# Patient Record
Sex: Male | Born: 1970
Health system: Southern US, Community
[De-identification: ages and names within clinical notes are randomized; demographics above are authoritative.]

## PROBLEM LIST (undated history)

## (undated) DIAGNOSIS — E119 Type 2 diabetes mellitus without complications: Secondary | ICD-10-CM

## (undated) DIAGNOSIS — F32A Depression, unspecified: Secondary | ICD-10-CM

## (undated) DIAGNOSIS — I639 Cerebral infarction, unspecified: Secondary | ICD-10-CM

## (undated) DIAGNOSIS — I10 Essential (primary) hypertension: Secondary | ICD-10-CM

## (undated) DIAGNOSIS — G919 Hydrocephalus, unspecified: Secondary | ICD-10-CM

## (undated) DIAGNOSIS — F329 Major depressive disorder, single episode, unspecified: Secondary | ICD-10-CM

## (undated) DIAGNOSIS — I5189 Other ill-defined heart diseases: Secondary | ICD-10-CM

## (undated) DIAGNOSIS — E785 Hyperlipidemia, unspecified: Secondary | ICD-10-CM

## (undated) DIAGNOSIS — R569 Unspecified convulsions: Secondary | ICD-10-CM

## (undated) HISTORY — DX: Major depressive disorder, single episode, unspecified: F32.9

## (undated) HISTORY — DX: Type 2 diabetes mellitus without complications: E11.9

## (undated) HISTORY — DX: Essential (primary) hypertension: I10

## (undated) HISTORY — DX: Hyperlipidemia, unspecified: E78.5

## (undated) HISTORY — DX: Cerebral infarction, unspecified: I63.9

## (undated) HISTORY — DX: Depression, unspecified: F32.A

## (undated) HISTORY — DX: Other ill-defined heart diseases: I51.89

## (undated) HISTORY — DX: Unspecified convulsions: R56.9

## (undated) HISTORY — DX: Hydrocephalus, unspecified: G91.9

---

## 2004-06-08 ENCOUNTER — Emergency Department: Payer: Self-pay | Admitting: Emergency Medicine

## 2007-02-12 ENCOUNTER — Emergency Department: Payer: Self-pay | Admitting: Unknown Physician Specialty

## 2007-04-05 ENCOUNTER — Other Ambulatory Visit: Payer: Self-pay

## 2007-04-05 ENCOUNTER — Emergency Department: Payer: Self-pay | Admitting: Emergency Medicine

## 2009-04-17 ENCOUNTER — Emergency Department: Payer: Self-pay | Admitting: Emergency Medicine

## 2012-08-29 ENCOUNTER — Emergency Department: Payer: Self-pay | Admitting: Internal Medicine

## 2012-08-29 LAB — CBC
HCT: 44.8 % (ref 40.0–52.0)
MCH: 28.8 pg (ref 26.0–34.0)
MCHC: 32.2 g/dL (ref 32.0–36.0)
Platelet: 219 10*3/uL (ref 150–440)
RBC: 5.01 10*6/uL (ref 4.40–5.90)
RDW: 13.3 % (ref 11.5–14.5)

## 2012-08-29 LAB — URINALYSIS, COMPLETE
Bacteria: NONE SEEN
Bilirubin,UR: NEGATIVE
Glucose,UR: NEGATIVE mg/dL (ref 0–75)
Ketone: NEGATIVE
Leukocyte Esterase: NEGATIVE
Ph: 5 (ref 4.5–8.0)
Protein: 30
RBC,UR: <1 /HPF (ref 0–5)
Specific Gravity: 1.006 (ref 1.003–1.030)
Squamous Epithelial: NONE SEEN

## 2012-08-29 LAB — COMPREHENSIVE METABOLIC PANEL
Alkaline Phosphatase: 82 U/L (ref 50–136)
Bilirubin,Total: 0.4 mg/dL (ref 0.2–1.0)
Calcium, Total: 8.4 mg/dL — ABNORMAL LOW (ref 8.5–10.1)
EGFR (African American): >60
Glucose: 171 mg/dL — ABNORMAL HIGH (ref 65–99)
Osmolality: 278 (ref 275–301)
Potassium: 4.3 mmol/L (ref 3.5–5.1)
SGPT (ALT): 21 U/L (ref 12–78)
Sodium: 137 mmol/L (ref 136–145)
Total Protein: 7.9 g/dL (ref 6.4–8.2)

## 2012-08-29 LAB — DRUG SCREEN, URINE
Amphetamines, Ur Screen: NEGATIVE (ref ?–1000)
Benzodiazepine, Ur Scrn: NEGATIVE (ref ?–200)
Cannabinoid 50 Ng, Ur ~~LOC~~: POSITIVE (ref ?–50)
Cocaine Metabolite,Ur ~~LOC~~: NEGATIVE (ref ?–300)
MDMA (Ecstasy)Ur Screen: NEGATIVE (ref ?–500)
Methadone, Ur Screen: NEGATIVE (ref ?–300)

## 2012-08-29 LAB — LIPASE, BLOOD: Lipase: 662 U/L — ABNORMAL HIGH (ref 73–393)

## 2012-08-29 LAB — ETHANOL: Ethanol %: 0.18 % — ABNORMAL HIGH (ref 0.000–0.080)

## 2013-12-14 ENCOUNTER — Inpatient Hospital Stay: Payer: Self-pay | Admitting: Internal Medicine

## 2013-12-14 LAB — COMPREHENSIVE METABOLIC PANEL
ALBUMIN: 3.9 g/dL (ref 3.4–5.0)
ALT: 18 U/L (ref 12–78)
Alkaline Phosphatase: 73 U/L
Anion Gap: 3 — ABNORMAL LOW (ref 7–16)
BUN: 18 mg/dL (ref 7–18)
Bilirubin,Total: 0.7 mg/dL (ref 0.2–1.0)
CREATININE: 1.42 mg/dL — AB (ref 0.60–1.30)
Calcium, Total: 9 mg/dL (ref 8.5–10.1)
Chloride: 108 mmol/L — ABNORMAL HIGH (ref 98–107)
Co2: 29 mmol/L (ref 21–32)
EGFR (Non-African Amer.): >60
GLUCOSE: 140 mg/dL — AB (ref 65–99)
Osmolality: 284 (ref 275–301)
Potassium: 3.8 mmol/L (ref 3.5–5.1)
SGOT(AST): 22 U/L (ref 15–37)
SODIUM: 140 mmol/L (ref 136–145)
Total Protein: 7.7 g/dL (ref 6.4–8.2)

## 2013-12-14 LAB — CBC
HCT: 45.2 % (ref 40.0–52.0)
HGB: 14.5 g/dL (ref 13.0–18.0)
MCH: 29 pg (ref 26.0–34.0)
MCHC: 32 g/dL (ref 32.0–36.0)
MCV: 91 fL (ref 80–100)
PLATELETS: 199 10*3/uL (ref 150–440)
RBC: 4.98 10*6/uL (ref 4.40–5.90)
RDW: 14.4 % (ref 11.5–14.5)
WBC: 4.2 10*3/uL (ref 3.8–10.6)

## 2013-12-14 LAB — URINALYSIS, COMPLETE
BACTERIA: NONE SEEN
BILIRUBIN, UR: NEGATIVE
GLUCOSE, UR: NEGATIVE mg/dL (ref 0–75)
KETONE: NEGATIVE
LEUKOCYTE ESTERASE: NEGATIVE
Nitrite: NEGATIVE
PH: 7 (ref 4.5–8.0)
PROTEIN: NEGATIVE
RBC,UR: 3 /HPF (ref 0–5)
SPECIFIC GRAVITY: 1.01 (ref 1.003–1.030)
Squamous Epithelial: NONE SEEN
WBC UR: <1 /HPF (ref 0–5)

## 2013-12-14 LAB — PROTIME-INR
INR: 1
PROTHROMBIN TIME: 13.4 s (ref 11.5–14.7)

## 2013-12-14 LAB — APTT: Activated PTT: 26.6 secs (ref 23.6–35.9)

## 2013-12-14 LAB — TROPONIN I: Troponin-I: <0.02 ng/mL

## 2013-12-15 LAB — LIPID PANEL
CHOLESTEROL: 205 mg/dL — AB (ref 0–200)
HDL Cholesterol: 49 mg/dL (ref 40–60)
Ldl Cholesterol, Calc: 125 mg/dL — ABNORMAL HIGH (ref 0–100)
Triglycerides: 154 mg/dL (ref 0–200)
VLDL CHOLESTEROL, CALC: 31 mg/dL (ref 5–40)

## 2013-12-15 LAB — CBC WITH DIFFERENTIAL/PLATELET
Basophil #: 0 10*3/uL (ref 0.0–0.1)
Basophil %: 0.6 %
EOS ABS: 0.3 10*3/uL (ref 0.0–0.7)
EOS PCT: 5.7 %
HCT: 42.1 % (ref 40.0–52.0)
HGB: 13.4 g/dL (ref 13.0–18.0)
LYMPHS PCT: 39.4 %
Lymphocyte #: 2 10*3/uL (ref 1.0–3.6)
MCH: 28.9 pg (ref 26.0–34.0)
MCHC: 31.7 g/dL — AB (ref 32.0–36.0)
MCV: 91 fL (ref 80–100)
MONO ABS: 0.5 x10 3/mm (ref 0.2–1.0)
Monocyte %: 10.2 %
Neutrophil #: 2.3 10*3/uL (ref 1.4–6.5)
Neutrophil %: 44.1 %
Platelet: 188 10*3/uL (ref 150–440)
RBC: 4.62 10*6/uL (ref 4.40–5.90)
RDW: 14.4 % (ref 11.5–14.5)
WBC: 5.1 10*3/uL (ref 3.8–10.6)

## 2013-12-15 LAB — BASIC METABOLIC PANEL
ANION GAP: 7 (ref 7–16)
BUN: 14 mg/dL (ref 7–18)
Calcium, Total: 9.1 mg/dL (ref 8.5–10.1)
Chloride: 104 mmol/L (ref 98–107)
Co2: 28 mmol/L (ref 21–32)
Creatinine: 1.07 mg/dL (ref 0.60–1.30)
EGFR (African American): >60
EGFR (Non-African Amer.): >60
Glucose: 99 mg/dL (ref 65–99)
Osmolality: 278 (ref 275–301)
POTASSIUM: 3.4 mmol/L — AB (ref 3.5–5.1)
Sodium: 139 mmol/L (ref 136–145)

## 2013-12-15 LAB — SEDIMENTATION RATE: Erythrocyte Sed Rate: 6 mm/hr (ref 0–15)

## 2013-12-16 ENCOUNTER — Encounter: Payer: Self-pay | Admitting: *Deleted
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
## 2013-12-17 ENCOUNTER — Other Ambulatory Visit: Payer: Self-pay | Admitting: Physical Medicine and Rehabilitation

## 2013-12-17 ENCOUNTER — Encounter: Payer: Self-pay | Admitting: Physical Medicine and Rehabilitation

## 2013-12-17 ENCOUNTER — Encounter (HOSPITAL_COMMUNITY): Payer: Self-pay | Admitting: Physical Medicine and Rehabilitation

## 2013-12-17 ENCOUNTER — Inpatient Hospital Stay (HOSPITAL_COMMUNITY)
Admission: RE | Admit: 2013-12-17 | Discharge: 2014-01-07 | DRG: 945 | Disposition: A | Discharge status: Home Health | Payer: Medicare Other | Source: Intra-hospital | Attending: Physical Medicine & Rehabilitation | Admitting: Physical Medicine & Rehabilitation

## 2013-12-17 DIAGNOSIS — R1310 Dysphagia, unspecified: Secondary | ICD-10-CM | POA: Diagnosis present

## 2013-12-17 DIAGNOSIS — I6340 Cerebral infarction due to embolism of unspecified cerebral artery: Secondary | ICD-10-CM | POA: Diagnosis present

## 2013-12-17 DIAGNOSIS — E1142 Type 2 diabetes mellitus with diabetic polyneuropathy: Secondary | ICD-10-CM | POA: Diagnosis present

## 2013-12-17 DIAGNOSIS — Z7982 Long term (current) use of aspirin: Secondary | ICD-10-CM | POA: Diagnosis not present

## 2013-12-17 DIAGNOSIS — IMO0001 Reserved for inherently not codable concepts without codable children: Secondary | ICD-10-CM | POA: Diagnosis not present

## 2013-12-17 DIAGNOSIS — I152 Hypertension secondary to endocrine disorders: Secondary | ICD-10-CM | POA: Diagnosis present

## 2013-12-17 DIAGNOSIS — R4701 Aphasia: Secondary | ICD-10-CM | POA: Diagnosis present

## 2013-12-17 DIAGNOSIS — G8190 Hemiplegia, unspecified affecting unspecified side: Secondary | ICD-10-CM | POA: Diagnosis present

## 2013-12-17 DIAGNOSIS — I6330 Cerebral infarction due to thrombosis of unspecified cerebral artery: Secondary | ICD-10-CM

## 2013-12-17 DIAGNOSIS — I429 Cardiomyopathy, unspecified: Secondary | ICD-10-CM

## 2013-12-17 DIAGNOSIS — Z5189 Encounter for other specified aftercare: Principal | ICD-10-CM

## 2013-12-17 DIAGNOSIS — R4789 Other speech disturbances: Secondary | ICD-10-CM | POA: Diagnosis present

## 2013-12-17 DIAGNOSIS — E1149 Type 2 diabetes mellitus with other diabetic neurological complication: Secondary | ICD-10-CM

## 2013-12-17 DIAGNOSIS — G40909 Epilepsy, unspecified, not intractable, without status epilepticus: Secondary | ICD-10-CM | POA: Diagnosis present

## 2013-12-17 DIAGNOSIS — G8110 Spastic hemiplegia affecting unspecified side: Secondary | ICD-10-CM

## 2013-12-17 DIAGNOSIS — E0959 Drug or chemical induced diabetes mellitus with other circulatory complications: Secondary | ICD-10-CM

## 2013-12-17 DIAGNOSIS — I10 Essential (primary) hypertension: Secondary | ICD-10-CM | POA: Diagnosis present

## 2013-12-17 DIAGNOSIS — I428 Other cardiomyopathies: Secondary | ICD-10-CM | POA: Diagnosis present

## 2013-12-17 DIAGNOSIS — E1159 Type 2 diabetes mellitus with other circulatory complications: Secondary | ICD-10-CM | POA: Diagnosis present

## 2013-12-17 DIAGNOSIS — I6350 Cerebral infarction due to unspecified occlusion or stenosis of unspecified cerebral artery: Secondary | ICD-10-CM

## 2013-12-17 DIAGNOSIS — N289 Disorder of kidney and ureter, unspecified: Secondary | ICD-10-CM | POA: Diagnosis not present

## 2013-12-17 DIAGNOSIS — Z8673 Personal history of transient ischemic attack (TIA), and cerebral infarction without residual deficits: Secondary | ICD-10-CM | POA: Diagnosis present

## 2013-12-17 DIAGNOSIS — Z7902 Long term (current) use of antithrombotics/antiplatelets: Secondary | ICD-10-CM | POA: Diagnosis not present

## 2013-12-17 DIAGNOSIS — I639 Cerebral infarction, unspecified: Secondary | ICD-10-CM

## 2013-12-17 DIAGNOSIS — E1165 Type 2 diabetes mellitus with hyperglycemia: Secondary | ICD-10-CM

## 2013-12-17 DIAGNOSIS — IMO0002 Reserved for concepts with insufficient information to code with codable children: Secondary | ICD-10-CM | POA: Diagnosis present

## 2013-12-17 MED ORDER — DIPHENHYDRAMINE HCL 12.5 MG/5ML PO ELIX: 12.5000 mg | ORAL_SOLUTION | Freq: Four times a day (QID) | ORAL | Status: DC | PRN

## 2013-12-17 MED ORDER — HYDROCHLOROTHIAZIDE 25 MG PO TABS
25.0000 mg | ORAL_TABLET | Freq: Every day | ORAL | Status: DC
  Administered 2013-12-18 – 2013-12-21 (×4): 25 mg via ORAL
  Filled 2013-12-17 (×6): qty 1

## 2013-12-17 MED ORDER — CLOPIDOGREL BISULFATE 75 MG PO TABS
75.0000 mg | ORAL_TABLET | Freq: Every day | ORAL | Status: DC
  Administered 2013-12-18 – 2014-01-07 (×21): 75 mg via ORAL
  Filled 2013-12-17 (×23): qty 1

## 2013-12-17 MED ORDER — ASPIRIN 81 MG PO CHEW
81.0000 mg | CHEWABLE_TABLET | Freq: Every day | ORAL | Status: DC
  Administered 2013-12-18 – 2014-01-07 (×21): 81 mg via ORAL
  Filled 2013-12-17 (×23): qty 1

## 2013-12-17 MED ORDER — LISINOPRIL 20 MG PO TABS
20.0000 mg | ORAL_TABLET | Freq: Every day | ORAL | Status: DC
  Administered 2013-12-18 – 2013-12-21 (×4): 20 mg via ORAL
  Filled 2013-12-17 (×6): qty 1

## 2013-12-17 MED ORDER — CARVEDILOL 6.25 MG PO TABS
6.2500 mg | ORAL_TABLET | Freq: Two times a day (BID) | ORAL | Status: DC
  Administered 2013-12-17 – 2014-01-07 (×43): 6.25 mg via ORAL
  Filled 2013-12-17 (×46): qty 1

## 2013-12-17 MED ORDER — ALUM & MAG HYDROXIDE-SIMETH 200-200-20 MG/5ML PO SUSP: 30.0000 mL | ORAL | Status: DC | PRN

## 2013-12-17 MED ORDER — BISACODYL 10 MG RE SUPP
10.0000 mg | Freq: Every day | RECTAL | Status: DC | PRN
  Administered 2013-12-21: 10 mg via RECTAL
  Filled 2013-12-17: qty 1

## 2013-12-17 MED ORDER — PROCHLORPERAZINE MALEATE 5 MG PO TABS
5.0000 mg | ORAL_TABLET | Freq: Four times a day (QID) | ORAL | Status: DC | PRN
  Filled 2013-12-17: qty 2

## 2013-12-17 MED ORDER — ATORVASTATIN CALCIUM 80 MG PO TABS
80.0000 mg | ORAL_TABLET | Freq: Every day | ORAL | Status: DC
  Administered 2013-12-17 – 2014-01-07 (×22): 80 mg via ORAL
  Filled 2013-12-17 (×22): qty 1

## 2013-12-17 MED ORDER — TRAZODONE HCL 50 MG PO TABS: 25.0000 mg | ORAL_TABLET | Freq: Every evening | ORAL | Status: DC | PRN

## 2013-12-17 MED ORDER — ENOXAPARIN SODIUM 40 MG/0.4ML ~~LOC~~ SOLN
40.0000 mg | Freq: Every day | SUBCUTANEOUS | Status: DC
  Administered 2013-12-18 – 2014-01-07 (×21): 40 mg via SUBCUTANEOUS
  Filled 2013-12-17 (×23): qty 0.4

## 2013-12-17 MED ORDER — PROCHLORPERAZINE 25 MG RE SUPP
12.5000 mg | Freq: Four times a day (QID) | RECTAL | Status: DC | PRN
  Filled 2013-12-17: qty 1

## 2013-12-17 MED ORDER — GABAPENTIN 300 MG PO CAPS
300.0000 mg | ORAL_CAPSULE | Freq: Three times a day (TID) | ORAL | Status: DC
  Administered 2013-12-17 – 2014-01-07 (×64): 300 mg via ORAL
  Filled 2013-12-17 (×67): qty 1

## 2013-12-17 MED ORDER — POLYETHYLENE GLYCOL 3350 17 G PO PACK
17.0000 g | PACK | Freq: Every day | ORAL | Status: DC
  Administered 2013-12-18 – 2013-12-20 (×3): 17 g via ORAL
  Filled 2013-12-17 (×4): qty 1

## 2013-12-17 MED ORDER — GUAIFENESIN-DM 100-10 MG/5ML PO SYRP: 5.0000 mL | ORAL_SOLUTION | Freq: Four times a day (QID) | ORAL | Status: DC | PRN

## 2013-12-17 MED ORDER — POLYETHYLENE GLYCOL 3350 17 G PO PACK
17.0000 g | PACK | Freq: Once | ORAL | Status: AC
  Administered 2013-12-17: 17 g via ORAL
  Filled 2013-12-17: qty 1

## 2013-12-17 MED ORDER — FLEET ENEMA 7-19 GM/118ML RE ENEM: 1.0000 | ENEMA | Freq: Every day | RECTAL | Status: DC | PRN

## 2013-12-17 MED ORDER — ACETAMINOPHEN 325 MG PO TABS: 325.0000 mg | ORAL_TABLET | ORAL | Status: DC | PRN

## 2013-12-17 MED ORDER — AMLODIPINE BESYLATE 10 MG PO TABS
10.0000 mg | ORAL_TABLET | Freq: Every day | ORAL | Status: DC
  Administered 2013-12-18 – 2014-01-07 (×21): 10 mg via ORAL
  Filled 2013-12-17 (×23): qty 1

## 2013-12-17 MED ORDER — PROCHLORPERAZINE EDISYLATE 5 MG/ML IJ SOLN
5.0000 mg | Freq: Four times a day (QID) | INTRAMUSCULAR | Status: DC | PRN
  Filled 2013-12-17: qty 2

## 2013-12-17 NOTE — H&P (Signed)
Physical Medicine and Rehabilitation Admission H&P  CC: Right sided weakness and difficulty talking  HPI: [name] is a 43 year old male with history of DM type 2, seizure disorder, HTN, prior CVA who was admitted to [facility] on 12/14/13 with weakness X 2 days and difficulty talking. Patient had been out of BP medications X 2 1/2 months. CT head with stable hydrocephalus. 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 Carotid dopplers with minimal plaque and no significant stenosis. 2D echo with mildly decreased LVF with moderately increased LV posterior wall and EF 40-45%. 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 He was placed on ASA and Plavix per neurology recommendations and TEE ordered to rule out SOE. TEE done today revealing normal LVF, no thrombi in left atrium, left ventricle or apex but smoke present, negative bubble study. Cardiology (Dr. Adrian Blackwater) recommended anticoagulation for secondary stroke prevention.  Swallow evaluation done by ST due to oromotor weakness and DIII, thin liquids recommended due to pocketing. Patient with resultant right sided weakness, mild right inattention, slurred speech and dysphagia. Blood pressures are improving with resumption of home meds. Therapies ongoing and he has had some improvement in right sided weakness. CIR recommended by rehab team and MD. Patient admitted for comprehensive inpatient rehab.   Pt c/o wetting on himself, per RN it appears he spilled his urinal  ROS :  Cannot obtain secondary to aphasia   Past Medical History    Diagnosis  Date    .  Hypertension     .  Diabetes mellitus without complication     .  Seizures     .  Stroke      No past surgical history on file.  No family history on file.  Social History:  Disabled. Lives with brother ( who works part time as a Curator) reports that he has never smoked. He does not have any smokeless tobacco history on file. He drinks 3 vodkas once a week. His alcohol and drug histories are not on file.  Allergies: Allergies not on file   (Not in a hospital admission)  Home:  Lives in an apartment.  Functional History:    Independent PTA.  Functional Status:  Mobility:  CGA for bed mobility  Mn to mod assist for sit to stand transfer with improvement in RLE stability.  Has had improvement in weight shifting but with tendency for posterior lean.  Mod assist X 2 for ambulation with frequent LOB posterior and to the right.  ADL:  Working on ROM RUE  Mod assist for toileting.  Cognition:    Home Medications:  Glipizide XL 10 mg bid  Gabapentin 300 mg tid  Metformin 1000 mg bid  ASA 81 mg qd  Prinzide 20/25 qd  Norvasc 10 mg qd  Lipitor 80 mg q hs  Physical Exam:  There were no vitals taken for this visit.  Physical Exam   General: No acute distress Mood and affect are appropriate Heart: Regular rate and rhythm no rubs murmurs or extra sounds Lungs: Clear to auscultation, breathing unlabored, no rales or wheezes Abdomen: Positive bowel sounds, soft nontender to palpation, nondistended Extremities: No clubbing, cyanosis, or edema Skin: No evidence of breakdown except small area on L forearm, no evidence of rash Neurologic: Cranial nerves II through XII intact, motor strength is 5/5 in Left deltoid, bicep, tricep,  grip, hip flexor, knee extensors, ankle dorsiflexor and plantar flexor 3 minus/5 in the left biceps triceps and deltoid 4 minus at grip, 4 minus right hip flexor knee extensor ankle dorsiflexor plantar flexor Sensory exam normal sensation to light touch and proprioception in left upper and lower extremities Decreased sensation to the right hand light touch. Normal sensation right lower extremity Cerebellar exam normal finger to nose to  finger as well as heel to shin in left upper and lower extremities Unable to perform this on the right side secondary to weakness Musculoskeletal: Full range of motion in all 4 extremities. No joint swelling Labwork:  Na-139 K-3.4 CL-104 Co2-28 BUN-14 Cr-1.07 Gluc-88  Hgb-13.4 Hct-42.1 WBC-5.1 Plt-188  Chol-205 HDL-49 LDL-125 VLDL- 31 Trig-154  B-12, Lupus AB, ANA, CRP--pending.  Medical Problem List and Plan:  1. Functional deficits secondary to embolic infarcts left corona radiata, left lentiform nucleus, left temporal lobe with R Hemiparesis and aphasia 2. DVT Prophylaxis/Anticoagulation: Pharmaceutical: Lovenox  3. Pain Management: Tylenol 650 mg every 4 hours when necessary  4. Mood: Monitor for post stroke depression  5. Neuropsych: This patient is not capable of making decisions on his own behalf.  6. HTN: 7. DM type 2:  8. Seizure disorder:  9.  Post Admission Physician Evaluation:  1. Functional deficits secondary to left brain embolic infarcts. 2. Patient is admitted to receive collaborative, interdisciplinary care between the physiatrist, rehab nursing staff, and therapy team. 3. Patient's level of medical complexity and substantial therapy needs in context of that medical necessity cannot be provided at a lesser intensity of care such as a SNF. 4. Patient has experienced substantial functional loss from his/her baseline which was documented above under the "Functional History" and "Functional Status" headings. Judging by the patient's diagnosis, physical exam, and functional history, the patient has potential for functional progress which will result in measurable gains while on inpatient rehab. These gains will be of substantial and practical use upon discharge in facilitating mobility and self-care at the household level. 5. Physiatrist will provide 24 hour management of medical needs as well as oversight of the therapy plan/treatment and provide guidance as appropriate regarding  the interaction of the two. 6. 24 hour rehab nursing will assist with bladder management, bowel management, safety, skin/wound care, disease management, medication administration, pain management and patient education and help integrate therapy concepts, techniques,education, etc. 7. PT will assess and treat for/with: pre gait, gait training, endurance , safety, equipment, neuromuscular re education. Goals are: Sup. 8. OT will assess and treat for/with: ADLs, Cognitive perceptual skills, Neuromuscular re education, safety, endurance, equipment. Goals are: min/Sup. 9. SLP will assess and treat for/with: Language, cognition. Goals are: express basic needs. 10. Case Management and Social Worker will assess and treat for psychological issues and discharge planning. 11. Team conference will be held weekly to assess progress toward goals and to determine barriers to discharge. 12. Patient will receive at least 3 hours of therapy per day at least 5 days per week. 13. ELOS: 15-20 days  14. Prognosis: excellent  Erick ColaceAndrew E. Sequoia Mincey M.D. [facility] FAAPM&R (Sports Med, Neuromuscular Med) Diplomate Am Board of Electrodiagnostic Med   12/17/2013

## 2013-12-17 NOTE — Consult Note (Addendum)
CARDIOLOGY CONSULT NOTE   Patient ID: [name] MRN: [id number] DOB/AGE: 1970-07-27 43 y.o.  Admit date: 12/17/2013  Primary Physician   No PCP Per Patient Primary Cardiologist   New Reason for Consultation  Smoke on TEE- guidance for anticoagulation   HPI:  [name] is a 43 year old male with history of congenital hydrocephalous, mild MR, DM type 2, seizure disorder, HTN, prior CVA who was admitted to [facility] on 12/14/13 with an acute CVA. Patient with resultant right sided weakness, slurred speech and dysphagia so he was transferred to [facility] on 12/17/13 for comprehensive inpatient rehab. At [facility] regional CT head with stable hydrocephalus. 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 He was placed on ASA and Plavix per neurology recommendations and TEE ordered to rule out SOE. TEE done revealing normal LVF, no thrombi in left atrium, left ventricle or apex but smoke present, negative bubble study. Cardiology (Dr. Adrian Blackwater) who performed the TEE recommended anticoagulation for secondary stroke prevention. 2D echo with mildly decreased LVF with moderately increased LV posterior wall and EF 40-45%. Patient with resultant right sided weakness, mild right inattention, slurred speech and dysphagia. Therapies ongoing and he has had some improvement in right sided weakness. CIR recommended by rehab team and MD. Patient admitted for comprehensive inpatient rehab. No documentation of atrial fibrillation and his telemetry was unremarkable while inpatient at [facility].   Patient has mild MR either at baseline or due to acute stroke. He is unable to tell me if he ever gets chest pain or SOB. It sounds like he does get chest pain from  time to time.    Past Medical History  Diagnosis Date  . Hypertension   . Diabetes mellitus without complication   . Seizures   . Stroke      No past surgical history on file.  No Known Allergies  I have reviewed the patient's current medications . [START ON 12/18/2013] amLODipine  10 mg Oral Daily  . [START ON 12/18/2013] aspirin  81 mg Oral Daily  . atorvastatin  80 mg Oral q1800  . [START ON 12/18/2013] clopidogrel  75 mg Oral Q breakfast  . [START ON 12/18/2013] enoxaparin (LOVENOX) injection  40 mg Subcutaneous Daily  . gabapentin  300 mg Oral TID  . [START ON 12/18/2013] hydrochlorothiazide  25 mg Oral Daily  . [START ON 12/18/2013] lisinopril  20 mg Oral Daily  . [START ON 12/18/2013] polyethylene glycol  17 g Oral Daily  . polyethylene glycol  17 g Oral Once     acetaminophen, alum & mag hydroxide-simeth, bisacodyl, diphenhydrAMINE, guaiFENesin-dextromethorphan, prochlorperazine, prochlorperazine, prochlorperazine, sodium phosphate, traZODone  Prior to Admission medications   Medication Sig Start Date End Date Taking? Authorizing Provider  amLODipine (NORVASC) 10 MG tablet Take 10 mg by mouth daily.   Yes Historical Provider, MD  atorvastatin (LIPITOR) 80 MG tablet Take 80 mg by mouth daily.   Yes Historical Provider, MD  benazepril-hydrochlorthiazide (LOTENSIN HCT) 20-25 MG per tablet Take 1 tablet by mouth daily.   Yes Historical Provider, MD  gabapentin (NEURONTIN) 300 MG capsule Take 300 mg by mouth 3 (three) times daily.   Yes Historical Provider, MD  glipiZIDE (GLUCOTROL XL) 10 MG 24 hr tablet Take 10 mg by mouth 2 (two) times daily.   Yes  Historical Provider, MD  metFORMIN (GLUCOPHAGE) 500 MG tablet Take 1,000 mg by mouth 2 (two) times daily with a meal.   Yes Historical Provider, MD     History   Social History  . Marital Status: Single    Spouse Name: N/A    Number of Children: N/A  . Years of Education: N/A   Occupational History  . Not on file.   Social  History Main Topics  . Smoking status: Never Smoker   . Smokeless tobacco: Not on file  . Alcohol Use: Not on file  . Drug Use: Not on file  . Sexual Activity: Not on file   Other Topics Concern  . Not on file   Social History Narrative  . No narrative on file    No family status information on file.   Family History  Problem Relation Age of Onset  . Stroke Father   . Heart disease Father      ROS:  Full 14 point review of systems complete and found to be negative unless listed above.  Physical Exam: Blood pressure 172/106, pulse 88, temperature 98.3 F (36.8 C), temperature source Oral, resp. rate 18, SpO2 98.00%.  General: Well developed, well nourished, male in no acute distress. Right facial drooping. Mild MR. Unable to get a good history  Head: Eyes PERRLA, No xanthomas.   Normocephalic and atraumatic, oropharynx without edema or exudate. Dentition:  Lungs: CTAB Heart: HRRR S1 S2, no rub/gallop, Heart irregular rate and rhythm with S1, S2  murmur. pulses are 2+ extrem.   Neck: No carotid bruits. No lymphadenopathy. NO JVD. Abdomen: Bowel sounds present, abdomen soft and non-tender without masses or hernias noted. Msk:  No spine or cva tenderness. No weakness, no joint deformities or effusions. Extremities: No clubbing or cyanosis. no edema.  Neuro: Alert and oriented X 3. No focal deficits noted. Psych:  Good affect, responds appropriately Skin: No rashes or lesions noted.  Echo:    Radiology:  No results found.  ASSESSMENT AND PLAN:    Principal Problem:   CVA (cerebral infarction) Active Problems:   HTN (hypertension)   Diabetes    [name] is a 43 year old male with history of congenital hydrocephalous, mild MR, DM type 2, seizure disorder, HTN, prior CVA who was admitted to [facility] on 12/14/13 with an acute CVA. Patient with resultant right sided weakness, slurred speech and dysphagia so he was [facility] for comprehensive inpatient  rehab. Smoke seen on TEE and cardiology consulted for guidance on anticoagulation.   CVA- on ASA and plavix. No documented Afib, there was talk of putting him on a monitor but never happened from what I see in records. Consider a loop monitor.  -- TEE ordered to rule out SOE.This revealed normal LVF, no thrombi in left atrium, left ventricle or apex but smoke present, negative bubble study -- Cardiologist who performed TEE recommended anticoagulation. At this point, with no documented afib it is felt that smoke on TEE is not enough evidence to initiate anticoagulation. MD to see.   LV dysfunction- 2D echo on 12/14/13 with EF 40-45%, mildly dec global LV dysfunction. Impaired relaxation pattern of LV diastolic filling. Mod increased left ventricular posterior wall thickness.  -- Continue ACE. Consider adding a BB if he does have reduced LV function (BP remains high)  Hypertension - per notes from [facility]- difficult to control but improved with lisinopril, HCTZ and Norvasc. BP remains high at 172/106.  HLD- cont statin  SignedThereasa Parkin, PA-C 12/17/2013 4:21 PM  Pager [phone]  Co-Sign MD  Patient seen and examined with Deborha Payment, PA-C. We discussed all aspects of the encounter. I agree with the assessment and plan as stated above. Despite his CVA and smoke on TEE, in the absence of clearly documented AF, I would not recommend systemic anticoagulation. Would continue ASA and Plavix. Would add carvedilol to help with HTN and LV dysfunction. Suspect CVA and LV dysfunction may be hypertensive in nature. ECG ordered. I d/w Dr. Graciela Husbands who agrees. Will ask EP to see on Monday to consider ILR (loop recorder).  If tele available on 41M can consider placing him on tele.   Garo Heidelberg,MD 6:02 PM

## 2013-12-17 NOTE — Progress Notes (Signed)
Patient trans in from [facility] this afternoon by PTAR around 1415. On assessments, he is alert and oriented. Slightly facial droop and weakness to the R arm and leg but MAE, full sensation with expressive aphasia. Vitals checked and recorded. Call bell placed within reach and oriented to the room.

## 2013-12-17 NOTE — Progress Notes (Signed)
Discussed patient with neurology regarding TEE results and need for anticoagulation. They recommended getting cardiology input on this as waiting two weeks to start full anticoagulation. [place] cards consulted and will await input on appropriate therapy.
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
## 2013-12-18 ENCOUNTER — Inpatient Hospital Stay (HOSPITAL_COMMUNITY): Payer: Medicare Other | Admitting: Physical Therapy

## 2013-12-18 ENCOUNTER — Inpatient Hospital Stay (HOSPITAL_COMMUNITY): Payer: Medicare Other | Admitting: Speech Pathology

## 2013-12-18 ENCOUNTER — Inpatient Hospital Stay (HOSPITAL_COMMUNITY): Payer: Medicare Other

## 2013-12-18 NOTE — Progress Notes (Signed)
Occupational Therapy Assessment and Plan  Patient Details  Name: [name] MRN: [id number] Date of Birth: January 26, 1971  OT Diagnosis: abnormal posture, apraxia, ataxia, cognitive deficits, disturbance of vision, hemiplegia affecting dominant side and muscle weakness (generalized) Rehab Potential: Rehab Potential: Good ELOS: 17-21 days   Today's Date: 12/18/2013 Time: 1100-1200 Time Calculation (min): 60 min  Problem List:  Patient Active Problem List   Diagnosis Date Noted  . CVA (cerebral infarction) 12/17/2013  . HTN (hypertension) 12/17/2013  . Diabetes 12/17/2013  . Cardiomyopathy 12/17/2013    Past Medical History:  Past Medical History  Diagnosis Date  . Hypertension   . Diabetes mellitus without complication   . Seizures   . Stroke    Past Surgical History: No past surgical history on file.  Assessment & Plan Clinical Impression: Patient is a 43 year old male with history of DM type 2, seizure disorder, HTN, prior CVA who was admitted to [facility] on 12/14/13 with weakness X 2 days and difficulty talking. Patient had been out of BP medications X 2 1/2 months. CT head with stable hydrocephalus. 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 Carotid dopplers with minimal plaque and no significant stenosis. 2D echo with mildly decreased LVF with moderately increased LV posterior wall and EF 40-45%. 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 He was placed on ASA and Plavix per neurology recommendations and TEE ordered to rule out SOE. TEE done today revealing normal LVF, no thrombi in left atrium, left ventricle or apex but smoke s/o low flow state, negative bubble study. Cardiology (Dr. Neoma Laming) recommended anticoagulation for secondary stroke prevention. Swallow  evaluation done by ST due to oromotor weakness and DIII, thin liquids recommended due to pocketing. Patient with resultant right sided weakness, mild right inattention, slurred speech and dysphagia. Blood pressures are improving with resumption of home meds. Therapies ongoing and he has had some improvement in right sided weakness. CIR recommended by rehab team and MD. Patient admitted for comprehensive inpatient rehab.  Patient transferred to CIR on 12/17/2013 .    Patient currently requires max with basic self-care skills secondary to muscle weakness, decreased cardiorespiratoy endurance, impaired timing and sequencing, unbalanced muscle activation, motor apraxia, ataxia, decreased coordination and decreased motor planning, decreased visual motor skills, decreased attention to right and decreased motor planning, decreased initiation, decreased attention, decreased problem solving and decreased safety awareness and decreased standing balance, decreased postural control and decreased balance strategies.  Prior to hospitalization, patient could complete BADLs with modified independent . Patient is poor historian and difficult to truly determine PLOF.  Patient will benefit from skilled intervention to increase independence with basic self-care skills prior to discharge home with care partner.  Anticipate patient will require 24 hour supervision and follow up home health.      Skilled Therapeutic Intervention OT eval completed. Discussed role of OT, goals of therapy, fall risk, and safety plan. Pt seen for ADL retraining with focus on functional use of RUE, postural control in standing, and sit<>stand. Pt received sitting in w/c. Completed bathing at sink with therapist providing [place] assist to initiate functional use of RUE ~20% of time then pt able to assist more. Pt with noted apraxia in RUE. Completed sit<>stand with mod assist however required max assist for standing balance secondary to trunk ataxia. Pt  noted to have inattention to RUE, required  mod cues for postioning throughout session. Pt left sitting in w/c with QRB donned and all needs in reach.   OT Evaluation Precautions/Restrictions  Precautions Precautions: Fall Restrictions Weight Bearing Restrictions: No General   Vital Signs   Pain Pain Assessment Pain Assessment: No/denies pain Pain Score: 0-No pain Home Living/Prior Functioning Home Living Available Help at Discharge: Family Type of Home: Apartment Home Access: Stairs to enter [employer] of Steps: 1 step to front door porch, 4 steps to back door entrance Entrance Stairs-Rails: None Home Layout: One level  Lives With: Family (brother) Prior Function Level of Independence: Independent with basic ADLs;Independent with transfers;Needs assistance with homemaking  Able to Take Stairs?: Yes Driving: No Vocation: On disability Leisure: Hobbies-yes (Comment) Comments: moving grass, relaxing ADL   Vision/Perception  Vision- History Baseline Vision/History: No visual deficits Patient Visual Report: Blurring of vision Vision- Assessment Vision Assessment?: Yes Eye Alignment: Impaired (comment) Ocular Range of Motion: Impaired-to be further tested in functional context;Restricted on the right;Restricted on the left;Restricted looking up;Restricted looking down Tracking/Visual Pursuits: Other (comment) (decreased tracking in all quadrants) Saccades: Additional head turns occurred during testing Convergence: Impaired (comment)  Cognition Overall Cognitive Status: Impaired/Different from baseline Arousal/Alertness: Awake/alert Orientation Level: Oriented to person;Oriented to place;Oriented to time;Oriented to situation (required choose of 2 d/t expressive aphasia) Attention: Sustained Sustained Attention: Appears intact Memory: Impaired Memory Impairment: Other (comment) (difficult to determine d/t expressive aphasia) Awareness: Appears intact Problem  Solving: Impaired Problem Solving Impairment: Verbal basic;Functional basic Safety/Judgment: Impaired Sensation Sensation Light Touch: Impaired Detail Light Touch Impaired Details: Impaired RUE (mainly distally) Additional Comments: RUE diminished sensation and pt reports numbness, BLEs appear intact Coordination Gross Motor Movements are Fluid and Coordinated: No Fine Motor Movements are Fluid and Coordinated: No Finger Nose Finger Test: unable to complete wtih RUE d/t weakness; rigid movements with LUE Motor    Mobility     Trunk/Postural Assessment     Balance   Extremity/Trunk Assessment RUE Assessment RUE Assessment: Exceptions to [facility] RUE AROM (degrees) RUE Overall AROM Comments: Has full ROM at shulder and elbow however inconsistent d/t apraxia. Grossly 3+/5 strength shoulder flexion, elbow flexion/ext LUE Assessment LUE Assessment: Within Functional Limits  FIM:  FIM - Grooming Grooming Steps: Wash, rinse, dry face;Oral care, brush teeth, clean dentures Grooming: 3: Patient completes 2 of 4 or 3 of 5 steps FIM - Bathing Bathing Steps Patient Completed: Chest;Right Arm;Abdomen;Front perineal area;Buttocks;Right upper leg;Left upper leg;Right lower leg (including foot) Bathing: 4: Min-Patient completes 8-9 32f10 parts or 75+ percent (max assist for standing balance) FIM - Upper Body Dressing/Undressing Upper body dressing/undressing steps patient completed: Thread/unthread left sleeve of pullover shirt/dress Upper body dressing/undressing: 2: Max-Patient completed 25-49% of tasks FIM - Lower Body Dressing/Undressing Lower body dressing/undressing: 1: Total-Patient completed less than 25% of tasks   Refer to Care Plan for Long Term Goals  Recommendations for other services: None  Discharge Criteria: Patient will be discharged from OT if patient refuses treatment 3 consecutive times without medical reason, if treatment goals not met, if there is a change in medical  status, if patient makes no progress towards goals or if patient is discharged from hospital.  The above assessment, treatment plan, treatment alternatives and goals were discussed and mutually agreed upon: by patient  PDuayne Cal6/13/2015, 11:27 AM

## 2013-12-18 NOTE — Progress Notes (Signed)
  Subjective/Complaints: 43 year old male with history of DM type 2, seizure disorder, HTN, prior CVA who was admitted to [facility] on 12/14/13 with weakness X 2 days and difficulty talking. Patient had been out of BP medications X 2 1/2 months. CT head with stable hydrocephalus. MRI brain done revealing acute infarct left corona radiata, lentiform nuclei, left posterior temporal lobe and chronic severe ventriculomegaly/hydrocephalus  Aphasic Review of Systems - unable to obtain secondary to aphasia  Objective: Vital Signs: Blood pressure 110/72, pulse 65, temperature 98 F (36.7 C), temperature source Oral, resp. rate 20, SpO2 97.00%. No results found. No results found for this or any previous visit (from the past 72 hour(s)).    Mood and affect are appropriate  Heart: Regular rate and rhythm no rubs murmurs or extra sounds  Lungs: Clear to auscultation, breathing unlabored, no rales or wheezes  Abdomen: Positive bowel sounds, soft nontender to palpation, nondistended  Extremities: No clubbing, cyanosis, or edema  Skin: No evidence of breakdown except small area on L forearm, no evidence of rash  Neurologic: Cranial nerves II through XII intact, motor strength is 5/5 in Left deltoid, bicep, tricep, grip, hip flexor, knee extensors, ankle dorsiflexor and plantar flexor  3 minus/5 in the left biceps triceps and deltoid 4 minus at grip, 4 minus right hip flexor knee extensor ankle dorsiflexor plantar flexor  Sensory exam normal sensation to light touch and proprioception in left upper and lower extremities  Decreased sensation to the right hand light touch. Normal sensation right lower extremity  Cerebellar exam normal finger to nose to finger as well as heel to shin in left upper and lower extremities  Unable to perform this on the right side secondary to weakness   Assessment/Plan: 1. Functional deficits secondary to Left CR, Lentiform nucleus, temporal infarct which require 3+ hours per day  of interdisciplinary therapy in a comprehensive inpatient rehab setting. Physiatrist is providing close team supervision and 24 hour management of active medical problems listed below. Physiatrist and rehab team continue to assess barriers to discharge/monitor patient progress toward functional and medical goals. FIM:                   Comprehension Comprehension Mode: Auditory Comprehension: 5-Understands complex 90% of the time/Cues < 10% of the time  Expression Expression Mode: Verbal Expression: 6-Expresses complex ideas: With extra time/assistive device  Social Interaction Social Interaction: 6-Interacts appropriately with others with medication or extra time (anti-anxiety, antidepressant).         Medical Problem List and Plan:  1. Functional deficits secondary to embolic infarcts left corona radiata, left lentiform nucleus, left temporal lobe with R Hemiparesis and aphasia  2. DVT Prophylaxis/Anticoagulation: Pharmaceutical: Lovenox  3. Pain Management: Tylenol 650 mg every 4 hours when necessary  4. Mood: Monitor for post stroke depression  5. Neuropsych: This patient is not capable of making decisions on his own behalf.  6. HTN: 7. DM type 2:  8. Seizure disorder:   LOS (Days) 1 A FACE TO FACE EVALUATION WAS PERFORMED  Demeco Ducksworth E 12/18/2013, 10:04 AM

## 2013-12-18 NOTE — Plan of Care (Signed)
Problem: RH BOWEL ELIMINATION Goal: RH STG MANAGE BOWEL WITH ASSISTANCE STG Manage Bowel with Assistance.Modified independent  Outcome: Not Progressing LBM 12-14-13 Goal: RH STG MANAGE BOWEL W/MEDICATION W/ASSISTANCE STG Manage Bowel with Medication with Assistance. Min assist  Outcome: Not Progressing LBM 12-14-13

## 2013-12-18 NOTE — Evaluation (Signed)
Speech Language Pathology Assessment and Plan  Patient Details  Name: [name] MRN: [id number] Date of Birth: 1970/08/10  SLP Diagnosis: Aphasia;Dysphagia;Cognitive Impairments  Rehab Potential: Excellent ELOS: 17-21 days   Today's Date: 12/18/2013 Time: 1430-1530 Time Calculation (min): 60 min  Problem List:  Patient Active Problem List   Diagnosis Date Noted  . CVA (cerebral infarction) 12/17/2013  . HTN (hypertension) 12/17/2013  . Diabetes 12/17/2013  . Cardiomyopathy 12/17/2013   Past Medical History:  Past Medical History  Diagnosis Date  . Hypertension   . Diabetes mellitus without complication   . Seizures   . Stroke    Past Surgical History: No past surgical history on file.  Assessment / Plan / Recommendation Clinical Impression Patient is a 43 year old male with history of DM type 2, seizure disorder, HTN, prior CVA who was admitted to [facility] on 12/14/13 with weakness X 2 days and difficulty talking. Patient had been out of BP medications X 2 1/2 months. CT head with stable hydrocephalus. 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 Carotid dopplers with minimal plaque and no significant stenosis. 2D echo with mildly decreased LVF with moderately increased LV posterior wall and EF 40-45%. 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 He was placed on ASA and Plavix per neurology recommendations and TEE ordered to rule out SOE. TEE done revealing normal LVF, no thrombi in left atrium, left ventricle or apex but smoke s/o low flow state, negative bubble study. Cardiology (Dr. Neoma Laming) recommended anticoagulation for secondary stroke prevention. Swallow evaluation done by ST due to oral-motor weakness and Dys. 3 textures with thin liquids recommended due  to pocketing. Blood pressures are improving with resumption of home meds. Therapies ongoing and he has had some improvement in right sided weakness. CIR recommended by rehab team and MD. Patient admitted for comprehensive inpatient rehab on 12/17/13 and demonstrates a moderate aphasia impacting all four modalities of language, however, his receptive abilities are greater than his expressive abilities. Pt answered all basic yes/no questions accurately and demonstrated ability to follow 3 step commands. His expressive abilities are characterized by semantic and phonemic paraphasias with an occasional neologism with dysfluencies, however, pt is aware of his errors and attempts to self-correct. The pt can also make his needs known with extra time. Pt also demonstrates mild cognitive impairments characterized by a right inattention, decreased problem solving and safety awareness impacting his ability to perform functional tasks safely. Pt was also administered a BSE. Pt consumed thin liquids via cup and straw without overt s/s of aspiration with mild anterior spillage that pt could self-monitor and correct with verbal cues. The pt also consumed solid textures with decreased bolus manipulation and cohesion leading to mild right sulci pocketing that cleared with liquid washes and a lingual sweep. Recommend pt continue current diet of Dys. 3 textures with thin liquids via cup and full supervision. Pt will benefit from skilled SLP intervention to maximize cognitive-linguistic and swallowing function and overall functional independence prior to discharge. r  Skilled Therapeutic Interventions          Pt administered a cognitive-linguistic evaluation and BSE. Please see above for details.   SLP Assessment  Patient will need skilled Speech Lanaguage Pathology Services during CIR admission    Recommendations  Diet Recommendations: Dysphagia 3 (Mechanical Soft);Thin liquid Liquid Administration via: Cup;No straw Medication  Administration: Whole meds with puree Supervision: Patient able to self feed;Full supervision/cueing for compensatory strategies Compensations: Slow rate;Small sips/bites;Check for pocketing Postural Changes and/or Swallow Maneuvers: Out of bed for meals;Upright 30-60 min after meal Oral Care Recommendations: Oral care BID Patient destination: Home Follow up Recommendations: Outpatient SLP;24 hour supervision/assistance Equipment Recommended: None recommended by SLP    SLP Frequency 5 out of 7 days   SLP Treatment/Interventions Cueing hierarchy;Cognitive remediation/compensation;Internal/external aids;Environmental controls;Speech/Language facilitation;Therapeutic Activities;Patient/family education;Functional tasks;Dysphagia/aspiration precaution training;Therapeutic Exercise    Pain No/Denies Pain  Short Term Goals: Week 1: SLP Short Term Goal 1 (Week 1): Pt will utilize swallowing compensatory strategies with Mod I to minimize overt s/s of aspiration.  SLP Short Term Goal 2 (Week 1): Pt will demonstrate effective mastication of regular textures with Mod I without overt s/s of aspiration.  SLP Short Term Goal 3 (Week 1): Pt will attend to the right field of enviornment during functional tasks with Min supervision cues.  SLP Short Term Goal 4 (Week 1): Pt will demonstrate functional problem solving for basic and familiar tasks with supervision mulimodal cues.  SLP Short Term Goal 5 (Week 1): Pt will self-monitor and correct verbal errors with Mod  A multimodal cues.   See FIM for current functional status Refer to Care Plan for Long Term Goals  Recommendations for other services: None  Discharge Criteria: Patient will be discharged from SLP if patient refuses treatment 3 consecutive times without medical reason, if treatment goals not met, if there is a change in medical status, if patient makes no progress towards goals or if patient is discharged from hospital.  The above assessment,  treatment plan, treatment alternatives and goals were discussed and mutually agreed upon: by patient  [name] 12/18/2013, 4:18 PM

## 2013-12-18 NOTE — Evaluation (Signed)
Physical Therapy Assessment and Plan  Patient Details  Name: [name] MRN: [id number] Date of Birth: December 30, 1970  PT Diagnosis: Abnormal posture, Abnormality of gait, Ataxia, Cognitive deficits, Hemiplegia dominant, Impaired cognition, Impaired sensation and Muscle weakness Rehab Potential: Good ELOS: 17-21 days   Today's Date: 12/18/2013 Time: 1100-1200 and 1400-1430 Time Calculation (min): 60 min and 30 min  Problem List:  Patient Active Problem List   Diagnosis Date Noted  . CVA (cerebral infarction) 12/17/2013  . HTN (hypertension) 12/17/2013  . Diabetes 12/17/2013  . Cardiomyopathy 12/17/2013    Past Medical History:  Past Medical History  Diagnosis Date  . Hypertension   . Diabetes mellitus without complication   . Seizures   . Stroke    Past Surgical History: No past surgical history on file.  Assessment & Plan Clinical Impression: Patient is a 43 year old male with history of DM type 2, seizure disorder, HTN, prior CVA who was admitted to [place] on 12/14/13 with weakness X 2 days and difficulty talking. Patient had been out of BP medications X 2 1/2 months. CT head with stable hydrocephalus. 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 Carotid dopplers with minimal plaque and no significant stenosis. 2D echo with mildly decreased LVF with moderately increased LV posterior wall and EF 40-45%. 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 He was placed on ASA and Plavix per neurology recommendations and TEE ordered to rule out SOE. TEE done today revealing normal LVF, no thrombi in left atrium, left ventricle or apex but smoke s/o low flow state, negative bubble study. Cardiology (Dr. Neoma Laming) recommended anticoagulation for secondary stroke prevention.  Swallow evaluation done by ST due to oromotor weakness and DIII, thin liquids recommended due to pocketing. Patient with resultant right sided weakness, mild right inattention, slurred speech and dysphagia. Blood pressures are improving with resumption of home meds. Therapies ongoing and he has had some improvement in right sided weakness. Patient transferred to CIR on 12/17/2013 .   Patient currently requires max to total A x 2 with mobility secondary to muscle weakness, impaired timing and sequencing, abnormal tone, unbalanced muscle activation, motor apraxia, ataxia, decreased coordination and decreased motor planning, decreased attention to right, decreased motor planning and ideational apraxia and decreased attention, decreased awareness, decreased problem solving, decreased safety awareness, decreased memory, delayed processing and expressive aphasia.  Prior to hospitalization, patient was independent  with mobility and lived with Family (brother) in a [place] home.  Home access is 1 step to front door porch, 4 steps to back door entranceStairs to enter. Patient is poor historian and difficulty to determine PLOF and amount of assistance available at discharge.   Patient will benefit from skilled PT intervention to maximize safe functional mobility, minimize fall risk and decrease caregiver burden for planned discharge home with 24 hour supervision.  Anticipate patient will benefit from follow up [place] at discharge.  PT - End of Session Activity Tolerance: Decreased this session;Tolerates 30+ min activity with multiple rests Endurance Deficit: Yes Endurance Deficit Description: requires rest breaks PT Assessment Rehab Potential: Good Barriers to Discharge: Decreased caregiver support (no family present, unsure of amount of assistance available at d/c) PT Patient demonstrates impairments in the following area(s): Balance;Behavior;Endurance;Motor;Perception;Safety;Sensory PT Transfers Functional  Problem(s): Bed Mobility;Bed to Chair;Furniture;Car;Floor PT Locomotion Functional Problem(s): Ambulation;Wheelchair Mobility;Stairs PT Plan PT Intensity: Minimum of 1-2 x/day ,  45 to 90 minutes PT Frequency: 5 out of 7 days PT Duration Estimated Length of Stay: 17-21 days PT Treatment/Interventions: Ambulation/gait training;Cognitive remediation/compensation;Balance/vestibular training;DME/adaptive equipment instruction;Functional electrical stimulation;Functional mobility training;Neuromuscular re-education;Patient/family education;Stair training;Therapeutic Activities;Therapeutic Exercise;UE/LE Strength taining/ROM;UE/LE Coordination activities;Wheelchair propulsion/positioning;Visual/perceptual remediation/compensation PT Transfers Anticipated Outcome(s): supervision PT Locomotion Anticipated Outcome(s): supervision-min A PT Recommendation Follow Up Recommendations: Home health PT Patient destination: Home Equipment Recommended: To be determined  Skilled Therapeutic Intervention Tx 1:Skilled therapeutic intervention initiated after completion of evaluation. Discussed falls risk, safety within room, and focus of therapy during stay. Session focused on functional transfers, w/c mobility, and gait. See details below. Patient with expressive aphasia resulting in patient being poor historian for PLOF/assist available at discharge and no family member present. Patient noted to have hemiplegia RUE > LUE, RUE inattention, apraxia, truncal ataxia in standing. Pt left sitting in w/c with quick release belt on awaiting OT evaluation.   Tx 2: Focus on functional transfers, sitting/standing balance, and stair negotiation. Pt received sitting in w/c, agreeable to therapy. Stand pivot transfer w/c > mat table with max A, pt with posterior lean. Sitting EOM, NMR for sit <> stand x 5 with emphasis on anterior weightshift, standing balance, and controlled descent with decreased R lean. Pt performed sitting and  standing static > dynamic balance reaching outside BOS to targets with mirror for visual feedback and stairs positioned anteriorly for UE support as needed. Pt requires close S to mod A for standing balance, decreased ataxia noted compared to earlier session. Stair negotiation up/down 3 stairs using 2 rails with +2 assist for safety, manual facilitation of weightshift and tactile/verbal cues for sequencing. Stand pivot transfer mat > w/c with improved anterior weightshift, max A. Pt returned to room and left sitting in w/c for SLP evaluation.   PT Evaluation Precautions/Restrictions Precautions Precautions: Fall Restrictions Weight Bearing Restrictions: No General Chart Reviewed: Yes Family/Caregiver Present: No Vital SignsTherapy Vitals BP: 107/69 mmHg Patient Position (if appropriate): Sitting (after ambulation) Pain Pain Assessment Pain Assessment: No/denies pain Pain Score: 0-No pain Home Living/Prior Functioning Home Living Available Help at Discharge: Family Type of Home: Apartment Home Access: Stairs to enter [employer] of Steps: 1 step to front door porch, 4 steps to back door entrance Entrance Stairs-Rails: None Home Layout: One level  Lives With: Family (brother) Prior Function Level of Independence: Independent with basic ADLs;Independent with transfers;Needs assistance with homemaking  Able to Take Stairs?: Yes Driving: No Vocation: On disability Leisure: Hobbies-yes (Comment) Comments: moving grass, relaxing Vision/Perception  Vision - Assessment Eye Alignment: Impaired (comment) Ocular Range of Motion: Impaired-to be further tested in functional context;Restricted on the right;Restricted on the left;Restricted looking up;Restricted looking down Tracking/Visual Pursuits: Other (comment) (decreased tracking in all quadrants) Saccades: Additional head turns occurred during testing Convergence: Impaired (comment)  Cognition Overall Cognitive Status:  Impaired/Different from baseline Arousal/Alertness: Awake/alert Orientation Level: Oriented to person;Oriented to place;Oriented to time;Oriented to situation (required choices and extra time due to aphasia) Attention: Sustained Sustained Attention: Appears intact Awareness: Appears intact Problem Solving: Impaired Problem Solving Impairment: Functional basic Executive Function: Self Monitoring;Self Correcting Self Monitoring: Impaired Self Monitoring Impairment: Verbal basic;Functional basic Self Correcting: Impaired Self Correcting Impairment: Verbal basic;Functional basic Safety/Judgment: Impaired Sensation Sensation Light Touch: Impaired by gross assessment Additional Comments: RUE diminished sensation (distally), BLEs appear intact Coordination Gross Motor Movements are Fluid and Coordinated: No Fine Motor Movements are Fluid and Coordinated: No Finger Nose Finger Test: unable to complete wtih RUE d/t weakness; rigid movements with LUE Heel Shin Test:  posterior LOB with testing BLEs sitting edge of mat, decreased coordination RLE Motor  Motor Motor: Ataxia;Motor apraxia;Hemiplegia;Abnormal postural alignment and control Motor - Skilled Clinical Observations: Hemiplegia RUE > RLE, truncal ataxia noted in standing   Mobility Bed Mobility Bed Mobility: Supine to Sit Supine to Sit: 4: Min guard;HOB flat Transfers Transfers: Yes Stand Pivot Transfers: 2: Max assist Stand Pivot Transfer Details: Manual facilitation for weight shifting;Tactile cues for placement;Verbal cues for technique;Verbal cues for sequencing Locomotion  Ambulation Ambulation: Yes Ambulation/Gait Assistance: 1: +2 Total assist Ambulation Distance (Feet): 20 Feet Assistive device: Other (Comment) (2 person "Three Musketeers" style) Ambulation/Gait Assistance Details: Verbal cues for gait pattern;Manual facilitation for weight shifting;Verbal cues for sequencing Ambulation/Gait Assistance Details: 3  muketeers style, therapist faciliating weightshift for adequate foot clearance and verbal/tactile cues for RLE knee ext in stance phase Gait Gait: Yes Gait Pattern: Impaired Gait Pattern: Step-to pattern;Decreased step length - left;Decreased stance time - right;Decreased dorsiflexion - right;Right flexed knee in stance;Ataxic;Decreased weight shift to right;Step-through pattern (truncal ataxia noted in standing) Gait velocity: decreased High Level Ambulation High Level Ambulation: Side stepping;Backwards walking Side Stepping: max A x 2 Backwards Walking: max A x 2 Stairs / Additional Locomotion Stairs: Yes Stairs Assistance: 1: +2 Total assist Stairs Assistance Details: Tactile cues for sequencing;Verbal cues for sequencing;Verbal cues for technique;Verbal cues for precautions/safety;Manual facilitation for weight shifting Stair Management Technique: Two rails Number of Stairs: 3 Height of Stairs: 6 Wheelchair Mobility Wheelchair Mobility: Yes Wheelchair Assistance: 4: Min assist;5: Investment banker, operational Details: Verbal cues for sequencing;Verbal cues for precautions/safety (R inattention) Wheelchair Propulsion: Left upper extremity;Both lower extermities Wheelchair Parts Management: Needs assistance Distance: 150 ft  Trunk/Postural Assessment  Cervical Assessment Cervical Assessment: Exceptions to [facility] (forward head) Thoracic Assessment Thoracic Assessment: Exceptions to [facility] (kyphotic) Lumbar Assessment Lumbar Assessment: Exceptions to [facility] (truncal ataxia noted in standing) Postural Control Postural Control: Deficits on evaluation Righting Reactions: Impaired in standing, decreased balance strategy  Balance Balance Balance Assessed: Yes Static Sitting Balance Static Sitting - Balance Support: Feet supported Static Sitting - Level of Assistance: 5: Stand by assistance Dynamic Sitting Balance Dynamic Sitting - Balance Support: Feet supported Dynamic Sitting -  Level of Assistance: 5: Stand by assistance Dynamic Sitting - Balance Activities: Lateral lean/weight shifting;Forward lean/weight shifting;Reaching across midline;Reaching for objects Static Standing Balance Static Standing - Balance Support: No upper extremity supported;Left upper extremity supported Static Standing - Level of Assistance: 4: Min assist;3: Mod assist Dynamic Standing Balance Dynamic Standing - Balance Support: No upper extremity supported;Left upper extremity supported Dynamic Standing - Level of Assistance: 3: Mod assist;2: Max assist Dynamic Standing - Balance Activities: Lateral lean/weight shifting;Forward lean/weight shifting;Reaching for objects;Reaching across midline Extremity Assessment  RUE Assessment RUE Assessment: Exceptions to [facility] RUE AROM (degrees) RUE Overall AROM Comments: Has full ROM at shulder and elbow however inconsistent d/t apraxia. Grossly 3+/5 strength shoulder flexion, elbow flexion/ext LUE Assessment LUE Assessment: Within Functional Limits RLE Assessment RLE Assessment: Exceptions to [facility] RLE Strength RLE Overall Strength: Deficits RLE Overall Strength Comments: overall 4/5 except ankle DF/PF 3-/5; difficult to assess d/t apraxia LLE Assessment LLE Assessment: Within Functional Limits  FIM:  FIM - Control and instrumentation engineer Devices: Arm rests Bed/Chair Transfer: 4: Supine > Sit: Min A (steadying Pt. > 75%/lift 1 leg);2: Bed > Chair or W/C: Max A (lift and lower assist);2: Chair or W/C > Bed: Max A (lift and lower assist) FIM - Locomotion: Wheelchair Distance: 150 ft Locomotion: Wheelchair: 5: Travels 150 ft  or more: maneuvers on rugs and over door sills with supervision, cueing or coaxing FIM - Locomotion: Ambulation Locomotion: Ambulation Assistive Devices: Other (comment) (3 musketeers style) Ambulation/Gait Assistance: 1: +2 Total assist Locomotion: Ambulation: 1: Two helpers (20 ft) FIM - Locomotion:  Stairs Locomotion: Scientist, physiological: Insurance account manager - 2 Locomotion: Stairs: 1: Two helpers (3 steps)   Refer to Care Plan for Long Term Goals  Recommendations for other services: None  Discharge Criteria: Patient will be discharged from PT if patient refuses treatment 3 consecutive times without medical reason, if treatment goals not met, if there is a change in medical status, if patient makes no progress towards goals or if patient is discharged from hospital.  The above assessment, treatment plan, treatment alternatives and goals were discussed and mutually agreed upon: by patient  Laretta Alstrom 12/18/2013, 12:40 PM

## 2013-12-19 ENCOUNTER — Inpatient Hospital Stay (HOSPITAL_COMMUNITY): Payer: Medicare Other | Admitting: Physical Therapy

## 2013-12-19 NOTE — Plan of Care (Signed)
Problem: RH BLADDER ELIMINATION Goal: RH STG MANAGE BLADDER WITH ASSISTANCE STG Manage Bladder With Min. Assistance  Outcome: Not Progressing Pt incont at times, spills urinal at times. Currently using condom cath at [facility] due to freq soiling of bed/clothing

## 2013-12-19 NOTE — IPOC Note (Addendum)
Overall Plan of Care [facility]) Patient Details Name: DUVALL GLISAN MRN: [id number] DOB: 05-22-1971  Admitting Diagnosis: L  MCA AND BG CVA  Hospital Problems: Principal Problem:   CVA (cerebral infarction) Active Problems:   HTN (hypertension)   Diabetes   Cardiomyopathy     Functional Problem List: Nursing Bladder;Bowel;Medication Management;Nutrition;Pain;Safety;Skin Integrity  PT [employer];Endurance;Motor;Perception;Safety;Sensory  OT Balance;Cognition;Endurance;Motor;Pain;Perception;Safety;Vision;Sensory  SLP Cognition;Linguistic  TR  activity tolerance, balance, safety, vision, cognition       Basic ADL's: OT Grooming;Bathing;Dressing;Toileting     Advanced  ADL's: OT       Transfers: PT Bed Mobility;Bed to Chair;Furniture;Car;Floor  OT Toilet;Tub/Shower     Locomotion: PT Ambulation;Wheelchair Mobility;Stairs     Additional Impairments: OT Fuctional Use of Upper Extremity  SLP Swallowing;Communication;Social Cognition comprehension;expression Problem Solving  TR      Anticipated Outcomes Item Anticipated Outcome  Self Feeding n/a  Swallowing  Mod I with least restrictive    Basic self-care  supervision   Toileting  supervision   Bathroom Transfers supervision  Bowel/Bladder  Will be continent of bowel and bladder modified independent  Transfers  supervision  Locomotion  supervision-min A  Communication  Supervision   Cognition  Supervision   Pain  pain will be 3 or less on a scale of 1-10  Safety/Judgment  supervision   Therapy Plan: PT Intensity: Minimum of 1-2 x/day ,45 to 90 minutes PT Frequency: 5 out of 7 days PT Duration Estimated Length of Stay: 17-21 days OT Intensity: Minimum of 1-2 x/day, 45 to 90 minutes OT Frequency: 5 out of 7 days OT Duration/Estimated Length of Stay: 17-21 days SLP Intensity: Minumum of 1-2 x/day, 30 to 90 minutes SLP Frequency: 5 out of 7 days SLP Duration/Estimated Length of Stay: 17-21 days        Team Interventions: Nursing Interventions Patient/Family Education;Bladder Management;Bowel Management;Disease Management/Prevention;Pain Management;Medication Management;Skin Care/Wound Management;Dysphagia/Aspiration Precaution Training;Discharge Planning;Psychosocial Support  PT interventions Ambulation/gait training;Cognitive remediation/compensation;Balance/vestibular training;DME/adaptive equipment instruction;Functional electrical stimulation;Functional mobility training;Neuromuscular re-education;Patient/family education;Stair training;Therapeutic Activities;Therapeutic Exercise;UE/LE Strength taining/ROM;UE/LE Coordination activities;Wheelchair propulsion/positioning;Visual/perceptual remediation/compensation  OT Interventions Balance/vestibular training;Cognitive remediation/compensation;Community reintegration;Discharge planning;DME/adaptive equipment instruction;Functional electrical stimulation;Functional mobility training;Neuromuscular re-education;Patient/family education;Psychosocial support;Self Care/advanced ADL retraining;Therapeutic Activities;UE/LE Strength taining/ROM;UE/LE Coordination activities;Therapeutic Exercise;Visual/perceptual remediation/compensation  SLP Interventions Cueing hierarchy;Cognitive remediation/compensation;Internal/external aids;Environmental controls;Speech/Language facilitation;Therapeutic Activities;Patient/family education;Functional tasks;Dysphagia/aspiration precaution training;Therapeutic Exercise  TR Interventions  Recreation/leisure participation, Balance/Vestibular training, functional mobility, therapeutic activities, UE/LE strength/coordination, visual compensation, w/c mobility, community reintegration, pt/family education, adaptive equipment instruction/use, discharge planning, psychosocial support   SW/CM Interventions      Team Discharge Planning: Destination: PT-Home ,OT- Home , SLP-Home Projected Follow-up: PT-Home health PT, OT-   Home health OT;24 hour supervision/assistance, SLP-Outpatient SLP;24 hour supervision/assistance Projected Equipment Needs: PT-To be determined, OT- To be determined, SLP-None recommended by SLP Equipment Details: PT- , OT-  Patient/family involved in discharge planning: PT- Patient,  OT-Patient unable/family or caregiver not available, SLP-   MD ELOS: 17-21d Medical Rehab Prognosis:  Good Assessment: 43 year old male with history of DM type 2, seizure disorder, HTN, prior CVA who was admitted to [facility] on 12/14/13 with weakness X 2 days and difficulty talking. Patient had been out of BP medications X 2 1/2 months. CT head with stable hydrocephalus. MRI brain done revealing acute infarct left corona radiata, lentiform nuclei, left posterior temporal lobe and chronic severe ventriculomegaly/hydrocephalus   Now requiring 24/7 Rehab RN,MD, as well as CIR level PT, OT and SLP.  Treatment team will focus on ADLs and mobility with goals set at Sup   See  Team Conference Notes for weekly updates to the plan of care

## 2013-12-19 NOTE — Progress Notes (Signed)
Physical Therapy Session Note  Patient Details  Name: [name] MRN: [id number] Date of Birth: 02-18-1971  Today's Date: 12/19/2013 Time: 0910-0950 Time Calculation (min): 40 min  Short Term Goals: Week 1:  PT Short Term Goal 1 (Week 1): Pt will perform bed mobility with supervision. PT Short Term Goal 2 (Week 1): Pt will perform bed<>w/c transfers with mod A. PT Short Term Goal 3 (Week 1): Pt will maintain static standing balance x 1 min with min A.  PT Short Term Goal 4 (Week 1): Pt will perform gait x 25 ft using LRAD with mod A x 1.   Skilled Therapeutic Interventions/Progress Updates:   Pt received supine in bed, brother [name]) present for session. Supine > sit with S and pt donned shoes with assist sitting EOB. Catheter noticed to be loose and leaking onto floor. Cleaned floor and patient transferred sit > stand and performed static standing with min-max A for upright posture due to R lean while RN changed brief. Stand pivot transfer bed > w/c with max A. W/c propulsion using BLES and LUE with vc's to attend to RUE to avoid getting hand caught in w/c and min A, increased difficulty sequencing RLE compared to yesterday. Pt performed stand pivot transfer w/c <> mat table with mod A, emphasis on anterior weightshift. Gait training with +2 assist x 10 ft, increased tendency to lean R and decreased weightshift L, difficulty with B foot clearance despite manual facilitation of weightshift, and R knee flex in stance. Pt performed standing dynamic balance with weight shift reaching across midline for horseshoe with L hand and reaching to place on raised basketball rim to L in order to promote L weightshift and R LE extension, min-max A for standing balance and postural control. Returned to room and left sitting in w/c with QRB on and all needs within reach.   Therapy Documentation Precautions:  Precautions Precautions: Fall Restrictions Weight Bearing Restrictions: No Pain: Pain  Assessment Pain Score: 0-No pain Locomotion : Ambulation Ambulation/Gait Assistance: 1: +2 Total assist Wheelchair Mobility Distance: 150   See FIM for current functional status  Therapy/Group: Individual Therapy  Kerney Elbe 12/19/2013, 12:32 PM

## 2013-12-19 NOTE — Progress Notes (Signed)
Subjective/Complaints: 43 year old male with history of DM type 2, seizure disorder, HTN, prior CVA who was admitted to [facility] on 12/14/13 with weakness X 2 days and difficulty talking. Patient had been out of BP medications X 2 1/2 months. CT head with stable hydrocephalus. MRI brain done revealing acute infarct left corona radiata, lentiform nuclei, left posterior temporal lobe and chronic severe ventriculomegaly/hydrocephalus  Aphasic, no c/os Review of Systems - unable to obtain secondary to aphasia  Objective: Vital Signs: Blood pressure 131/80, pulse 73, temperature 98.3 F (36.8 C), temperature source Oral, resp. rate 16, SpO2 96.00%. No results found. No results found for this or any previous visit (from the past 72 hour(s)).    Mood and affect are appropriate  Heart: Regular rate and rhythm no rubs murmurs or extra sounds  Lungs: Clear to auscultation, breathing unlabored, no rales or wheezes  Abdomen: Positive bowel sounds, soft nontender to palpation, nondistended  Extremities: No clubbing, cyanosis, or edema  Skin: No evidence of breakdown except small area on L forearm, no evidence of rash  Neurologic: Cranial nerves II through XII intact, motor strength is 5/5 in Left deltoid, bicep, tricep, grip, hip flexor, knee extensors, ankle dorsiflexor and plantar flexor  3 minus/5 in the left biceps triceps and deltoid 4 minus at grip, 4 minus right hip flexor knee extensor ankle dorsiflexor plantar flexor     Assessment/Plan: 1. Functional deficits secondary to Left CR, Lentiform nucleus, temporal infarct which require 3+ hours per day of interdisciplinary therapy in a comprehensive inpatient rehab setting. Physiatrist is providing close team supervision and 24 hour management of active medical problems listed below. Physiatrist and rehab team continue to assess barriers to discharge/monitor patient progress toward functional and medical goals. FIM: FIM - Bathing Bathing Steps  Patient Completed: Chest;Right Arm;Abdomen;Front perineal area;Buttocks;Right upper leg;Left upper leg;Right lower leg (including foot) Bathing: 4: Min-Patient completes 8-9 20f 10 parts or 75+ percent (max assist for standing balance)  FIM - Upper Body Dressing/Undressing Upper body dressing/undressing steps patient completed: Thread/unthread left sleeve of pullover shirt/dress Upper body dressing/undressing: 2: Max-Patient completed 25-49% of tasks FIM - Lower Body Dressing/Undressing Lower body dressing/undressing: 1: Total-Patient completed less than 25% of tasks        FIM - Press photographer Assistive Devices: Arm rests Bed/Chair Transfer: 4: Supine > Sit: Min A (steadying Pt. > 75%/lift 1 leg);2: Bed > Chair or W/C: Max A (lift and lower assist);2: Chair or W/C > Bed: Max A (lift and lower assist)  FIM - Locomotion: Wheelchair Distance: 150 ft Locomotion: Wheelchair: 5: Travels 150 ft or more: maneuvers on rugs and over door sills with supervision, cueing or coaxing FIM - Locomotion: Ambulation Locomotion: Ambulation Assistive Devices: Other (comment) (3 musketeers style) Ambulation/Gait Assistance: 1: +2 Total assist Locomotion: Ambulation: 1: Two helpers (20 ft)  Comprehension Comprehension Mode: Auditory Comprehension: 5-Understands basic 90% of the time/requires cueing < 10% of the time  Expression Expression Mode: Verbal Expression: 2-Expresses basic 25 - 49% of the time/requires cueing 50 - 75% of the time. Uses single words/gestures.  Social Interaction Social Interaction: 4-Interacts appropriately 75 - 89% of the time - Needs redirection for appropriate language or to initiate interaction.  Problem Solving Problem Solving: 4-Solves basic 75 - 89% of the time/requires cueing 10 - 24% of the time  Memory Memory: 4-Recognizes or recalls 75 - 89% of the time/requires cueing 10 - 24% of the time   Medical Problem List and Plan:  1. Functional  deficits secondary to embolic infarcts left corona radiata, left lentiform nucleus, left temporal lobe with R Hemiparesis and aphasia  2. DVT Prophylaxis/Anticoagulation: Pharmaceutical: Lovenox  3. Pain Management: Tylenol 650 mg every 4 hours when necessary  4. Mood: Monitor for post stroke depression  5. Neuropsych: This patient is not capable of making decisions on his own behalf.  6. HTN: 7. DM type 2:  8. Seizure disorder:   LOS (Days) 2 A FACE TO FACE EVALUATION WAS PERFORMED  Saadiya Wilfong E 12/19/2013, 8:52 AM

## 2013-12-19 NOTE — Plan of Care (Signed)
Problem: RH BOWEL ELIMINATION Goal: RH STG MANAGE BOWEL WITH ASSISTANCE STG Manage Bowel with Assistance.Modified independent  Outcome: Not Progressing LBM 6/9 pt declining laxatives at this time

## 2013-12-20 ENCOUNTER — Inpatient Hospital Stay (HOSPITAL_COMMUNITY): Payer: Medicare Other | Admitting: Physical Therapy

## 2013-12-20 ENCOUNTER — Inpatient Hospital Stay (HOSPITAL_COMMUNITY): Payer: Medicare Other | Admitting: Speech Pathology

## 2013-12-20 ENCOUNTER — Encounter (HOSPITAL_COMMUNITY): Payer: Self-pay | Admitting: *Deleted

## 2013-12-20 ENCOUNTER — Inpatient Hospital Stay (HOSPITAL_COMMUNITY): Payer: Medicare Other | Admitting: Occupational Therapy

## 2013-12-20 ENCOUNTER — Encounter (HOSPITAL_COMMUNITY)
Admission: RE | Disposition: A | Discharge status: Home Health | Payer: Self-pay | Source: Intra-hospital | Attending: Physical Medicine & Rehabilitation

## 2013-12-20 ENCOUNTER — Encounter (HOSPITAL_COMMUNITY): Payer: Medicare Other | Admitting: Occupational Therapy

## 2013-12-20 DIAGNOSIS — I6350 Cerebral infarction due to unspecified occlusion or stenosis of unspecified cerebral artery: Secondary | ICD-10-CM

## 2013-12-20 DIAGNOSIS — G8110 Spastic hemiplegia affecting unspecified side: Secondary | ICD-10-CM

## 2013-12-20 DIAGNOSIS — I6330 Cerebral infarction due to thrombosis of unspecified cerebral artery: Secondary | ICD-10-CM

## 2013-12-20 HISTORY — PX: LOOP RECORDER IMPLANT: SHX5954

## 2013-12-20 HISTORY — PX: LOOP RECORDER IMPLANT: SHX5477

## 2013-12-20 LAB — CBC WITH DIFFERENTIAL/PLATELET
Basophils Absolute: 0 10*3/uL (ref 0.0–0.1)
Basophils Relative: 0 % (ref 0–1)
EOS PCT: 6 % — AB (ref 0–5)
Eosinophils Absolute: 0.3 10*3/uL (ref 0.0–0.7)
HCT: 42.5 % (ref 39.0–52.0)
Hemoglobin: 13.8 g/dL (ref 13.0–17.0)
LYMPHS ABS: 1.4 10*3/uL (ref 0.7–4.0)
LYMPHS PCT: 32 % (ref 12–46)
MCH: 29.1 pg (ref 26.0–34.0)
MCHC: 32.5 g/dL (ref 30.0–36.0)
MCV: 89.7 fL (ref 78.0–100.0)
Monocytes Absolute: 0.7 10*3/uL (ref 0.1–1.0)
Monocytes Relative: 16 % — ABNORMAL HIGH (ref 3–12)
Neutro Abs: 2 10*3/uL (ref 1.7–7.7)
Neutrophils Relative %: 46 % (ref 43–77)
Platelets: 181 10*3/uL (ref 150–400)
RBC: 4.74 MIL/uL (ref 4.22–5.81)
RDW: 13.6 % (ref 11.5–15.5)
WBC: 4.3 10*3/uL (ref 4.0–10.5)

## 2013-12-20 LAB — COMPREHENSIVE METABOLIC PANEL
ALBUMIN: 3.7 g/dL (ref 3.5–5.2)
ALT: 18 U/L (ref 0–53)
AST: 13 U/L (ref 0–37)
Alkaline Phosphatase: 61 U/L (ref 39–117)
BUN: 24 mg/dL — AB (ref 6–23)
CO2: 25 mEq/L (ref 19–32)
Calcium: 9.6 mg/dL (ref 8.4–10.5)
Chloride: 99 mEq/L (ref 96–112)
Creatinine, Ser: 1.19 mg/dL (ref 0.50–1.35)
GFR calc Af Amer: 86 mL/min — ABNORMAL LOW (ref >90)
GFR calc non Af Amer: 74 mL/min — ABNORMAL LOW (ref >90)
Glucose, Bld: 205 mg/dL — ABNORMAL HIGH (ref 70–99)
POTASSIUM: 4.2 meq/L (ref 3.7–5.3)
SODIUM: 136 meq/L — AB (ref 137–147)
TOTAL PROTEIN: 7.1 g/dL (ref 6.0–8.3)
Total Bilirubin: 0.5 mg/dL (ref 0.3–1.2)

## 2013-12-20 LAB — GLUCOSE, CAPILLARY
Glucose-Capillary: 254 mg/dL — ABNORMAL HIGH (ref 70–99)
Glucose-Capillary: 139 mg/dL — ABNORMAL HIGH (ref 70–99)
Glucose-Capillary: 173 mg/dL — ABNORMAL HIGH (ref 70–99)

## 2013-12-20 SURGERY — LOOP RECORDER IMPLANT
Anesthesia: LOCAL

## 2013-12-20 MED ORDER — ONDANSETRON HCL 4 MG/2ML IJ SOLN: 4.0000 mg | Freq: Four times a day (QID) | INTRAMUSCULAR | Status: DC | PRN

## 2013-12-20 MED ORDER — ENOXAPARIN SODIUM 30 MG/0.3ML ~~LOC~~ SOLN: 30.0000 mg | SUBCUTANEOUS | Status: DC

## 2013-12-20 MED ORDER — INSULIN ASPART 100 UNIT/ML ~~LOC~~ SOLN
0.0000 [IU] | Freq: Three times a day (TID) | SUBCUTANEOUS | Status: DC
Start: 2013-12-20 — End: 2014-01-03
  Administered 2013-12-20: 2 [IU] via SUBCUTANEOUS
  Administered 2013-12-20: 8 [IU] via SUBCUTANEOUS
  Administered 2013-12-21: 5 [IU] via SUBCUTANEOUS
  Administered 2013-12-21 (×2): 3 [IU] via SUBCUTANEOUS
  Administered 2013-12-22 (×3): 5 [IU] via SUBCUTANEOUS
  Administered 2013-12-23: 3 [IU] via SUBCUTANEOUS
  Administered 2013-12-23: 8 [IU] via SUBCUTANEOUS
  Administered 2013-12-23: 3 [IU] via SUBCUTANEOUS
  Administered 2013-12-24: 5 [IU] via SUBCUTANEOUS
  Administered 2013-12-24: 8 [IU] via SUBCUTANEOUS
  Administered 2013-12-24: 3 [IU] via SUBCUTANEOUS
  Administered 2013-12-25: 2 [IU] via SUBCUTANEOUS
  Administered 2013-12-25 (×2): 3 [IU] via SUBCUTANEOUS
  Administered 2013-12-26: 8 [IU] via SUBCUTANEOUS
  Administered 2013-12-26: 2 [IU] via SUBCUTANEOUS
  Administered 2013-12-27: 3 [IU] via SUBCUTANEOUS
  Administered 2013-12-27 – 2013-12-28 (×2): 2 [IU] via SUBCUTANEOUS
  Administered 2013-12-28: 3 [IU] via SUBCUTANEOUS
  Administered 2013-12-28 – 2013-12-29 (×3): 2 [IU] via SUBCUTANEOUS
  Administered 2013-12-30: 3 [IU] via SUBCUTANEOUS
  Administered 2013-12-30 – 2013-12-31 (×3): 2 [IU] via SUBCUTANEOUS

## 2013-12-20 MED ORDER — ACETAMINOPHEN 325 MG PO TABS: 325.0000 mg | ORAL_TABLET | ORAL | Status: DC | PRN

## 2013-12-20 MED ORDER — SENNOSIDES-DOCUSATE SODIUM 8.6-50 MG PO TABS
2.0000 | ORAL_TABLET | Freq: Every day | ORAL | Status: DC
  Administered 2013-12-20 – 2013-12-21 (×2): 2 via ORAL
  Filled 2013-12-20 (×3): qty 2

## 2013-12-20 MED ORDER — LIDOCAINE-EPINEPHRINE 1 %-1:100000 IJ SOLN
INTRAMUSCULAR | Status: AC
  Filled 2013-12-20: qty 1

## 2013-12-20 NOTE — Care Management Note (Signed)
Inpatient Rehabilitation Center Individual Statement of Services  Patient Name:  [name]  Date:  12/20/2013  Welcome to the Inpatient Rehabilitation [facility].  Our goal is to provide you with an individualized program based on your diagnosis and situation, designed to meet your specific needs.  With this comprehensive rehabilitation program, you will be expected to participate in at least 3 hours of rehabilitation therapies Monday-Friday, with modified therapy programming on the weekends.  Your rehabilitation program will include the following services:  Physical Therapy (PT), Occupational Therapy (OT), Speech Therapy (ST), 24 hour per day rehabilitation nursing, Therapeutic Recreaction (TR), Neuropsychology, Case Management (Social Worker), Rehabilitation Medicine, Nutrition Services and Pharmacy Services  Weekly team conferences will be held on Wednesday to discuss your progress.  Your Social Worker will talk with you frequently to get your input and to update you on team discussions.  Team conferences with you and your family in attendance may also be held.  Expected length of stay: 17-21 days Overall anticipated outcome: supervision with cues/set up  Depending on your progress and recovery, your program may change. Your Social Worker will coordinate services and will keep you informed of any changes. Your Social Worker's name and contact numbers are listed  below.  The following services may also be recommended but are not provided by the Inpatient Rehabilitation [facility]:   Home Health Rehabiltiation Services  Outpatient Rehabilitation Services    Arrangements will be made to provide these services after discharge if needed.  Arrangements include referral to agencies that provide these services.  Your insurance has been verified to be:  Medicare & Medicaid Your primary doctor is:  Unsure  Pertinent information will be shared with your doctor and your insurance company.  Social  Worker:  Dossie Der, SW [phone] or ([phone]  Information discussed with and copy given to patient by: Lucy Chris, 12/20/2013, 1:49 PM

## 2013-12-20 NOTE — H&P (View-Only) (Signed)
ELECTROPHYSIOLOGY CONSULT NOTE   Patient ID: [name] MRN: [id number], DOB/AGE: 1971/01/03   Admit date: 12/17/2013 Date of Consult: 12/20/2013  Primary Physician: No PCP Per Patient Primary Cardiologist: new to [facility] [place], [place] Reason for Consultation: Cryptogenic stroke; recommendations regarding Implantable Loop Recorder  History of Present Illness [name] is a 43 year old man with congenital hydrocephalous, mild mental retardation, DM, seizure disorder, HTN and prior CVA who was admitted to [facility] on 12/14/13 with an acute CVA. He has resultant right sided weakness, slurred speech and dysphagia and was transferred to [facility] on 12/17/13 for comprehensive inpatient rehab. Smoke seen on TEE and Cardiology was consulted for guidance on anticoagulation. Dr Gala Romney recs - "Despite his CVA and smoke on TEE, in the absence of clearly documented AF, I would not recommend systemic anticoagulation. Would continue ASA and Plavix. Would add carvedilol to help with HTN and LV dysfunction. Suspect CVA and LV dysfunction may be hypertensive in nature. ECG ordered. I d/w Dr. Graciela Husbands who agrees. Will ask EP to see on Monday to consider ILR (loop recorder)." EP has been asked to evaluate for placement of an implantable loop recorder to monitor for atrial fibrillation.  Past Medical History Past Medical History  Diagnosis Date  . Hypertension   . Diabetes mellitus without complication   . Seizures   . Stroke     Past Surgical History None  Allergies/Intolerances No Known Allergies  Inpatient Medications . amLODipine  10 mg Oral Daily  . aspirin  81 mg Oral Daily  . atorvastatin  80 mg Oral q1800  . carvedilol  6.25 mg Oral BID WC  . clopidogrel  75 mg Oral Q breakfast  . enoxaparin (LOVENOX) injection  40 mg Subcutaneous Daily  . gabapentin  300 mg Oral TID  . hydrochlorothiazide  25 mg Oral Daily  . lisinopril  20 mg Oral Daily  . polyethylene glycol  17 g Oral Daily     Social History History   Social History  . Marital Status: Single    Spouse Name: N/A    Number of Children: N/A  . Years of Education: N/A   Occupational History  . Not on file.   Social History Main Topics  . Smoking status: Never Smoker   . Smokeless tobacco: Not on file  . Alcohol Use: Not on file  . Drug Use: Not on file  . Sexual Activity: Not on file   Other Topics Concern  . Not on file   Social History Narrative  . No narrative on file    Review of Systems General: No chills, fever, night sweats or weight changes  Cardiovascular:  No chest pain, dyspnea on exertion, edema, orthopnea, palpitations, paroxysmal nocturnal dyspnea Dermatological: No rash, lesions or masses Respiratory: No cough, dyspnea Urologic: No hematuria, dysuria Abdominal: No nausea, vomiting, diarrhea, bright red blood per rectum, melena, or hematemesis Neurologic: No visual changes, weakness, changes in mental status All other systems reviewed and are otherwise negative except as noted above.  Physical Exam Blood pressure 133/78, pulse 76, temperature 97.9 F (36.6 C), temperature source Oral, resp. rate 18, SpO2 95.00%.  General: Well developed, well appearing 43 y.o. male in no acute distress. HEENT: Normocephalic, atraumatic. EOMs intact. Sclera nonicteric. Oropharynx clear.  Neck: Supple without bruits. No JVD. Lungs: Respirations regular and unlabored, CTA bilaterally. No wheezes, rales or rhonchi. Heart: RRR. S1, S2 present. No murmurs, rub, S3 or S4. Abdomen: Soft, non-tender, non-distended. BS present x  4 quadrants. No hepatosplenomegaly.  Extremities: No clubbing, cyanosis or edema. DP/PT/Radials 2+ and equal bilaterally. Psych: Normal affect. Neuro: Alert and oriented X 3. Moves all extremities spontaneously. Musculoskeletal: No kyphosis. Skin: Intact. Warm and dry. No rashes or petechiae in exposed areas.   Labs CBC    Component Value Date/Time   WBC 4.3 12/20/2013  0750   RBC 4.74 12/20/2013 0750   HGB 13.8 12/20/2013 0750   HCT 42.5 12/20/2013 0750   PLT 181 12/20/2013 0750   MCV 89.7 12/20/2013 0750   MCH 29.1 12/20/2013 0750   MCHC 32.5 12/20/2013 0750   RDW 13.6 12/20/2013 0750   LYMPHSABS 1.4 12/20/2013 0750   MONOABS 0.7 12/20/2013 0750   EOSABS 0.3 12/20/2013 0750   BASOSABS 0.0 12/20/2013 0750   BMET No results found for this basename: na, k, cl, co2, glucose, bun, creatinine, calcium, gfrnonaa, gfraa    Radiology/Studies No results found.  Studies from [facility] 1. 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  2. 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  3. TEE done revealing normal LVF, no thrombi in left atrium, left ventricle or apex but smoke present, negative bubble study. Cardiology (Dr. Adrian BlackwaterShaukat Khan) who performed the TEE recommended anticoagulation for secondary stroke prevention.  4. 2D echo with mildly decreased LVF with moderately increased LV posterior wall and EF 40-45%.   Telemetry - Inpatient Rehab not a telemetry unit  Assessment and Plan 1. Cryptogenic stroke  At this time we recommend loop recorder insertion to monitor for AF. The indication for loop recorder insertion / monitoring for AF in setting of cryptogenic stroke was discussed with [name] and his brother. The loop recorder insertion procedure was reviewed in detail including risks and benefits. These risks include but are not limited to bleeding and infection. [name] expressed verbal understanding and agrees to proceed. He was also counseled regarding wound care and device follow-up.  Signed, Rick DuffEDMISTEN, BROOKE, PA-C 12/20/2013, 8:04 AM  EP Attending  Patient seen and examined. Agree with above history, physical exam, assessment and plan. The  patient has had a cryptogenic stroke. No obvious source. Will plan insertion of an ILR.  Leonia ReevesGregg Taylor,M.D.

## 2013-12-20 NOTE — Progress Notes (Signed)
Social Work Assessment and Plan Social Work Assessment and Plan  Patient Details  Name: [name] MRN: [id number] Date of Birth: 02-28-1971  Today's Date: 12/20/2013  Problem List:  Patient Active Problem List   Diagnosis Date Noted  . CVA (cerebral infarction) 12/17/2013  . HTN (hypertension) 12/17/2013  . Diabetes 12/17/2013  . Cardiomyopathy 12/17/2013   Past Medical History:  Past Medical History  Diagnosis Date  . Hypertension   . Diabetes mellitus without complication   . Seizures   . Stroke    Past Surgical History: No past surgical history on file. Social History:  reports that he has never smoked. He does not have any smokeless tobacco history on file. His alcohol and drug histories are not on file.  Family / Support Systems Marital Status: Single Patient Roles: Other (Comment) (Sibling) Other Supports: Jerry-brother     [name] Corbert-friend [phone]-cell   Valarie Baldwin-friend  [phone] Anticipated Caregiver: [name] Ability/Limitations of Caregiver: [name] lives with him, but at times works odd jobs.  Awar ept will need 24 hr care at discharge Caregiver Availability: 24/7 Family Dynamics: [name] provides information since pt is aphasic, reports it is just them left.  Parents are both deceased and they help one another.  [name] states: " It is only [place] no one else, we take care of each other."  Both seem to be co-dependent upon each other, know no different have always been together.  Social History Preferred language: English Religion: Baptist Cultural Background: No issues Education: Did not finish [employer], according to brother.  Read: Yes (limited) Write: Yes (Limited) Employment Status: Disabled Date Retired/Disabled/Unemployed: Childhood Fish farm manager Issues: No issues Guardian/Conservator: None-according to MD pt is not capable of making his own decisions while here.  Will look toward brother since he is next of kin and only  family member, according to brother.     Abuse/Neglect Physical Abuse: Denies Verbal Abuse: Denies Sexual Abuse: Denies Exploitation of patient/patient's resources: Denies Self-Neglect: Denies  Emotional Status Pt's affect, behavior adn adjustment status: Pt is sleeping but does wake up and listens it appears.  Brother reports he has always been independent and taken care of himself he needed help with money and brother felt he needed to watch over him.  He was not aware of not taking his medicines or going to the MD.  He realizes now he should of made sure he was taking his medicines. Recent Psychosocial Issues: Other medical issues and financial issues Pyschiatric History: No history according to brother.  Unable to screen for depression due to pt's language deficits.  Will re-assess when able and more improved and can participate. Substance Abuse History: Would drink socially but brother felt not an issue and not frequently-no other drug usuage  Patient / Family Perceptions, Expectations & Goals Pt/Family understanding of illness & functional limitations: Borhter can explain pt's stroke and seems to understand his deficits.  He feel she needs to stay here with him to make sure he is ok and it helps him feel better about it also.  They have always been together and he looked after him.  Pt feels less anxious when her is here.  Brother can attend therapies with pt and strt learining his care.   Premorbid pt/family roles/activities: Brother, friend, etc Anticipated changes in roles/activities/participation: resume Pt/family expectations/goals: Brother states: " I hope he makes progress here, I will do what is needed."  Pt states: " I'm ok."  Unsure if he was listening or not,  tries to answer questions.  Community Resources Levi StraussCommunity Agencies: None Premorbid Home Care/DME Agencies: None Transportation available at discharge: Friends or walked a Radiation protection practitionerlot Resource referrals recommended: Support  group (specify) (CVA SUpport group)  Discharge Planning Living Arrangements: Other relatives Support Systems: Other relatives Type of Residence: Private residence Insurance Resources: Medicare;Medicaid (specify county) Air cabin crew([place]) Surveyor, quantityinancial Resources: SSD Financial Screen Referred: Yes Living Expenses: Psychologist, sport and exerciseent Money Management: Family Does the patient have any problems obtaining your medications?: No Home Management: Both of them would help with home management Patient/Family Preliminary Plans: Return home with borther providing care.  They will have to move since landlord is selling the rented duplex they are currently living in.  Brother is looking for a new place to live.  Will discuss section 8 housing and see if other resources they qualify for.  Both seem to rely heavily upon one another and brother is very committed to his brother. Social Work Anticipated Follow Up Needs: HH/OP;Support Group  Clinical Impression Pt appears motivated to improve but is very tired from therapy this am.  Brother is very committed to him and has always taken care and watched out for him.  It is just the two  Of them.  Will look into other resources they qualify for since they seem to be struggling financially.  Brother aware of the need to make sure pt takes his medicines when he goes home. Will work on a safe discharge plan with brother, he is aware pt will require 24 hr care at discharge.  Lucy Chrisupree, Johnluke Haugen G 12/20/2013, 2:26 PM

## 2013-12-20 NOTE — Interval H&P Note (Signed)
History and Physical Interval Note:  12/20/2013 1:45 PM  [name]  has presented today for surgery, with the diagnosis of syncope  The various methods of treatment have been discussed with the patient and family. After consideration of risks, benefits and other options for treatment, the patient has consented to  Procedure(s): LOOP RECORDER IMPLANT (N/A) as a surgical intervention .  The patient's history has been reviewed, patient examined, no change in status, stable for surgery.  I have reviewed the patient's chart and labs.  Questions were answered to the patient's satisfaction.     Leonia Reeves.D.

## 2013-12-20 NOTE — Consult Note (Signed)
ELECTROPHYSIOLOGY CONSULT NOTE   Patient ID: [name] MRN: [id number], DOB/AGE: 1971/01/03   Admit date: 12/17/2013 Date of Consult: 12/20/2013  Primary Physician: No PCP Per Patient Primary Cardiologist: new to [facility] [place], MD Reason for Consultation: Cryptogenic stroke; recommendations regarding Implantable Loop Recorder  History of Present Illness [name] is a 43 year old man with congenital hydrocephalous, mild mental retardation, DM, seizure disorder, HTN and prior CVA who was admitted to [facility] on 12/14/13 with an acute CVA. He has resultant right sided weakness, slurred speech and dysphagia and was transferred to [facility] on 12/17/13 for comprehensive inpatient rehab. Smoke seen on TEE and Cardiology was consulted for guidance on anticoagulation. Dr Gala Romney recs - "Despite his CVA and smoke on TEE, in the absence of clearly documented AF, I would not recommend systemic anticoagulation. Would continue ASA and Plavix. Would add carvedilol to help with HTN and LV dysfunction. Suspect CVA and LV dysfunction may be hypertensive in nature. ECG ordered. I d/w Dr. Graciela Husbands who agrees. Will ask EP to see on Monday to consider ILR (loop recorder)." EP has been asked to evaluate for placement of an implantable loop recorder to monitor for atrial fibrillation.  Past Medical History Past Medical History  Diagnosis Date  . Hypertension   . Diabetes mellitus without complication   . Seizures   . Stroke     Past Surgical History None  Allergies/Intolerances No Known Allergies  Inpatient Medications . amLODipine  10 mg Oral Daily  . aspirin  81 mg Oral Daily  . atorvastatin  80 mg Oral q1800  . carvedilol  6.25 mg Oral BID WC  . clopidogrel  75 mg Oral Q breakfast  . enoxaparin (LOVENOX) injection  40 mg Subcutaneous Daily  . gabapentin  300 mg Oral TID  . hydrochlorothiazide  25 mg Oral Daily  . lisinopril  20 mg Oral Daily  . polyethylene glycol  17 g Oral Daily     Social History History   Social History  . Marital Status: Single    Spouse Name: N/A    Number of Children: N/A  . Years of Education: N/A   Occupational History  . Not on file.   Social History Main Topics  . Smoking status: Never Smoker   . Smokeless tobacco: Not on file  . Alcohol Use: Not on file  . Drug Use: Not on file  . Sexual Activity: Not on file   Other Topics Concern  . Not on file   Social History Narrative  . No narrative on file    Review of Systems General: No chills, fever, night sweats or weight changes  Cardiovascular:  No chest pain, dyspnea on exertion, edema, orthopnea, palpitations, paroxysmal nocturnal dyspnea Dermatological: No rash, lesions or masses Respiratory: No cough, dyspnea Urologic: No hematuria, dysuria Abdominal: No nausea, vomiting, diarrhea, bright red blood per rectum, melena, or hematemesis Neurologic: No visual changes, weakness, changes in mental status All other systems reviewed and are otherwise negative except as noted above.  Physical Exam Blood pressure 133/78, pulse 76, temperature 97.9 F (36.6 C), temperature source Oral, resp. rate 18, SpO2 95.00%.  General: Well developed, well appearing 43 y.o. male in no acute distress. HEENT: Normocephalic, atraumatic. EOMs intact. Sclera nonicteric. Oropharynx clear.  Neck: Supple without bruits. No JVD. Lungs: Respirations regular and unlabored, CTA bilaterally. No wheezes, rales or rhonchi. Heart: RRR. S1, S2 present. No murmurs, rub, S3 or S4. Abdomen: Soft, non-tender, non-distended. BS present x  4 quadrants. No hepatosplenomegaly.  Extremities: No clubbing, cyanosis or edema. DP/PT/Radials 2+ and equal bilaterally. Psych: Normal affect. Neuro: Alert and oriented X 3. Moves all extremities spontaneously. Musculoskeletal: No kyphosis. Skin: Intact. Warm and dry. No rashes or petechiae in exposed areas.   Labs CBC    Component Value Date/Time   WBC 4.3 12/20/2013  0750   RBC 4.74 12/20/2013 0750   HGB 13.8 12/20/2013 0750   HCT 42.5 12/20/2013 0750   PLT 181 12/20/2013 0750   MCV 89.7 12/20/2013 0750   MCH 29.1 12/20/2013 0750   MCHC 32.5 12/20/2013 0750   RDW 13.6 12/20/2013 0750   LYMPHSABS 1.4 12/20/2013 0750   MONOABS 0.7 12/20/2013 0750   EOSABS 0.3 12/20/2013 0750   BASOSABS 0.0 12/20/2013 0750   BMET No results found for this basename: na, k, cl, co2, glucose, bun, creatinine, calcium, gfrnonaa, gfraa    Radiology/Studies No results found.  Studies from [facility] 1. 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  2. 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  3. TEE done revealing normal LVF, no thrombi in left atrium, left ventricle or apex but smoke present, negative bubble study. Cardiology (Dr. Shaukat Khan) who performed the TEE recommended anticoagulation for secondary stroke prevention.  4. 2D echo with mildly decreased LVF with moderately increased LV posterior wall and EF 40-45%.   Telemetry - Inpatient Rehab not a telemetry unit  Assessment and Plan 1. Cryptogenic stroke  At this time we recommend loop recorder insertion to monitor for AF. The indication for loop recorder insertion / monitoring for AF in setting of cryptogenic stroke was discussed with [name] and his brother. The loop recorder insertion procedure was reviewed in detail including risks and benefits. These risks include but are not limited to bleeding and infection. [name] expressed verbal understanding and agrees to proceed. He was also counseled regarding wound care and device follow-up.  Signed, EDMISTEN, BROOKE, PA-C 12/20/2013, 8:04 AM  EP Attending  Patient seen and examined. Agree with above history, physical exam, assessment and plan. The  patient has had a cryptogenic stroke. No obvious source. Will plan insertion of an ILR.  Gregg Taylor,M.D.   

## 2013-12-20 NOTE — Plan of Care (Signed)
Problem: RH PAIN MANAGEMENT Goal: RH STG PAIN MANAGED AT OR BELOW PT'S PAIN GOAL 3 or less on scale of 1-10  Outcome: Progressing No c/o pain     

## 2013-12-20 NOTE — CV Procedure (Signed)
Electrophysiology procedure note  Procedure: Insertion of an implantable loop recorder  Indication: Cryptogenic stroke  Description of the procedure: After informed consent was obtained, the patient was taken to the diagnostic electrophysiology laboratory in the fasting state. After the usual preparation and draping, 20 mg of lidocaine was infiltrated into the left pectoral region subcutaneously. A 1 cm stab incision was carried out, and the Medtronic reveal implantable loop recorder, serial number [id number] was inserted into the subcutaneous space. Benzoin and Steri-Strips were painted on the wound. The R wave measured 0.9 mV. Bandage was placed and the patient was returned to his room in satisfactory condition.  Complications: There were no immediate procedure complications  Conclusion: Successful insertion of a Medtronic implantable loop recorder in a patient with cryptogenic stroke.   Lewayne Bunting, M.D.

## 2013-12-20 NOTE — Progress Notes (Signed)
Occupational Therapy Session Note  Patient Details  Name: [name] MRN: [id number] Date of Birth: 01-11-1971  Today's Date: 12/20/2013 Time: 0905-1005 Time Calculation (min): 60 min  Short Term Goals: Week 1:  OT Short Term Goal 1 (Week 1): Pt will complete toilet transfer at mod assist OT Short Term Goal 2 (Week 1): Pt will complete 1 grooming task in standing with mod assist OT Short Term Goal 3 (Week 1): Pt will initiate use of RUE during self-care tasks 25% of time OT Short Term Goal 4 (Week 1): Pt will complete LB dressing with max assist  Skilled Therapeutic Interventions/Progress Updates:    Patient seen this am for OT intervention to address postural control, balance, and neuromuscular re-education to right upper extremities.  Patient's brother in and out this session, appears very supportive.  Patient with difficulty sustaining muscle activation in right leg in standing to pull up pants, and for dynamic standing tasks.  Patient with active movement in right upper extremity, although has difficulty accessing movement volitionally.   Therapy Documentation Precautions:  Precautions Precautions: Fall Restrictions Weight Bearing Restrictions: No   Pain: Pain Assessment Pain Assessment: No/denies pain Pain Score: 0-No pain  See FIM for current functional status  Therapy/Group: Individual Therapy  Collier Salina 12/20/2013, 1:02 PM

## 2013-12-20 NOTE — Progress Notes (Signed)
Physical Therapy Session Note  Patient Details  Name: [name] MRN: [id number] Date of Birth: [birth year]  Today's Date: 12/20/2013 Time: 1030-1129 Time Calculation (min): 59 min  Short Term Goals: Week 1:  PT Short Term Goal 1 (Week 1): Pt will perform bed mobility with supervision. PT Short Term Goal 2 (Week 1): Pt will perform bed<>w/c transfers with mod A. PT Short Term Goal 3 (Week 1): Pt will maintain static standing balance x 1 min with min A.  PT Short Term Goal 4 (Week 1): Pt will perform gait x 25 ft using LRAD with mod A x 1.   Skilled Therapeutic Interventions/Progress Updates:    Pt received semi-reclined in bed; agreeable to therapy. Session focused on facilitating anterior weight shift to increase pt independence with functional transfers/mobility. Pt performed sit>sit with bed rail requiring supervision. W/c mobility 2x150' in controlled environment with L hemi technique and min A to manually facilitate LLE movement during initial 10-15' of each trial; tactile cueing at L knee to increase LLE weightbearing. Performed multiple squat pivot transfers from bed<>w/c<>mat table with max A, multimodal cueing for anterior weight shift, tactile cueing for hand placement. See below for detailed description of NMR.  Pt performed gait x15' in controlled environment with L hallway hand rail and PT positioned under LUE providing manual facilitation of weight shift to L side; during final 50% of trial, pt required manual stabilization of R knee during RLE stance to prevent knee buckling. Pt able to advance RLE without assistance. Session ended in pt room, where pt was left seated in w/c with quick release belt on for safety, brother present, and all needs within reach. Per brother (present during final 5 minutes of session), current home has 2 steps to enter without rails. Brother reports that he and pt will likely be moving into new home around 01/05/14. CSW Kriste Basque made aware of possible change in  home setup at D/C.  Therapy Documentation Precautions:  Precautions Precautions: Fall Restrictions Weight Bearing Restrictions: No Pain: Pain Assessment Pain Assessment: No/denies pain Locomotion : Ambulation Ambulation/Gait Assistance: 1: +2 Total assist;Other (comment) (+2A for w/c follow) Wheelchair Mobility Distance: 150  NMR: Neuromuscular Facilitation: Upper Extremity;Lower Extremity;Forced use;Activity to increase timing and sequencing;Activity to increase motor control;Activity to increase sustained activation;Activity to increase anterior-posterior weight shifting  Seated EOM with manual facilitation of RUE weightbearing on mat table to increase muscle activation, multimodal cueing provided to facilitate anterior pelvic tilt (secondary to pt tendency toward posterior pelvic tilt, posterior LOB). Performed LUE anterior reaching to promote anterior weight shift; transitioned to anterior reaching followed by bilat scooting on raised mat table x7 reps to L, x5 reps to R with mod A, manual stabilization of RUE. Performed multiple trials of sit<>stand from raised mat table; min-mod A required for sit>stand; max A for stand>sit secondary to limited anterior weight shift, decreased hip flexion.  Static standing with LUE support at stair rail x4 trials (x10-25 seconds per trial) with manual stabilization of R knee.  See FIM for current functional status  Therapy/Group: Individual Therapy  Yahayra Geis, Lorenda Ishihara 12/20/2013, 1:52 PM

## 2013-12-20 NOTE — Progress Notes (Signed)
Patient information reviewed and entered into eRehab system by Rayhan Groleau, RN, CRRN, PPS Coordinator.  Information including medical coding and functional independence measure will be reviewed and updated through discharge.     Per nursing patient was given "Data Collection Information Summary for Patients in Inpatient Rehabilitation Facilities with attached "Privacy Act Statement-Health Care Records" upon admission.  

## 2013-12-20 NOTE — Progress Notes (Signed)
Occupational Therapy Session Note  Patient Details  Name: OLUWATOBI KINDSCHI MRN: [id number] Date of Birth: [birth year]  Today's Date: 12/20/2013 Time: 8675-4492 Time Calculation (min): 25 min  Short Term Goals: Week 1:  OT Short Term Goal 1 (Week 1): Pt will complete toilet transfer at mod assist OT Short Term Goal 2 (Week 1): Pt will complete 1 grooming task in standing with mod assist OT Short Term Goal 3 (Week 1): Pt will initiate use of RUE during self-care tasks 25% of time OT Short Term Goal 4 (Week 1): Pt will complete LB dressing with max assist  Skilled Therapeutic Interventions/Progress Updates:    Patient seen this pm for OT intervention to address neuromuscular re-education to right upper extremity.  Patient just back from procedure - cardiac monitor, and staff monitoring vitals every 15 minutes.  Patient allowed to stay in bed for this session.  Patient with new orders for TED hose, obtained and applied. Patient and brother educated regarding rationale for support hose.  Patient able to isolate full elbow flexion, and 50% elbow extension against gravity - fatiguing rapidly.  Patient with delayed response to muscle activation in digits, but has both mass flexion and extension in all digits.    Therapy Documentation Precautions:  Precautions Precautions: Fall Restrictions Weight Bearing Restrictions: No   Pain: Pain Assessment Pain Assessment: No/denies pain  See FIM for current functional status  Therapy/Group: Individual Therapy  Collier Salina 12/20/2013, 3:40 PM

## 2013-12-20 NOTE — Progress Notes (Signed)
Subjective/Complaints: 43 year old male with history of DM type 2, seizure disorder, HTN, prior CVA who was admitted to [facility] on 12/14/13 with weakness X 2 days and difficulty talking. Patient had been out of BP medications X 2 1/2 months. CT head with stable hydrocephalus. MRI brain done revealing acute infarct left corona radiata, lentiform nuclei, left posterior temporal lobe and chronic severe ventriculomegaly/hydrocephalus  No new issues. Denies pain. Ready to do more therapy Review of Systems - limited due to aphasia  Objective: Vital Signs: Blood pressure 116/78, pulse 76, temperature 97.9 F (36.6 C), temperature source Oral, resp. rate 18, SpO2 95.00%. No results found. Results for orders placed during the hospital encounter of 12/17/13 (from the past 72 hour(s))  CBC WITH DIFFERENTIAL     Status: Abnormal   Collection Time    12/20/13  7:50 AM      Result Value Ref Range   WBC 4.3  4.0 - 10.5 K/uL   RBC 4.74  4.22 - 5.81 MIL/uL   Hemoglobin 13.8  13.0 - 17.0 g/dL   HCT 42.5  39.0 - 52.0 %   MCV 89.7  78.0 - 100.0 fL   MCH 29.1  26.0 - 34.0 pg   MCHC 32.5  30.0 - 36.0 g/dL   RDW 13.6  11.5 - 15.5 %   Platelets 181  150 - 400 K/uL   Neutrophils Relative % 46  43 - 77 %   Neutro Abs 2.0  1.7 - 7.7 K/uL   Lymphocytes Relative 32  12 - 46 %   Lymphs Abs 1.4  0.7 - 4.0 K/uL   Monocytes Relative 16 (*) 3 - 12 %   Monocytes Absolute 0.7  0.1 - 1.0 K/uL   Eosinophils Relative 6 (*) 0 - 5 %   Eosinophils Absolute 0.3  0.0 - 0.7 K/uL   Basophils Relative 0  0 - 1 %   Basophils Absolute 0.0  0.0 - 0.1 K/uL  COMPREHENSIVE METABOLIC PANEL     Status: Abnormal   Collection Time    12/20/13  7:50 AM      Result Value Ref Range   Sodium 136 (*) 137 - 147 mEq/L   Potassium 4.2  3.7 - 5.3 mEq/L   Chloride 99  96 - 112 mEq/L   CO2 25  19 - 32 mEq/L   Glucose, Bld 205 (*) 70 - 99 mg/dL   BUN 24 (*) 6 - 23 mg/dL   Creatinine, Ser 1.19  0.50 - 1.35 mg/dL   Calcium 9.6  8.4 - 10.5  mg/dL   Total Protein 7.1  6.0 - 8.3 g/dL   Albumin 3.7  3.5 - 5.2 g/dL   AST 13  0 - 37 U/L   ALT 18  0 - 53 U/L   Alkaline Phosphatase 61  39 - 117 U/L   Total Bilirubin 0.5  0.3 - 1.2 mg/dL   GFR calc non Af Amer 74 (*) >90 mL/min   GFR calc Af Amer 86 (*) >90 mL/min   Comment: (NOTE)     The eGFR has been calculated using the CKD EPI equation.     This calculation has not been validated in all clinical situations.     eGFR's persistently <90 mL/min signify possible Chronic Kidney     Disease.      Mood and affect are appropriate  Heart: Regular rate and rhythm no rubs murmurs or extra sounds  Lungs: Clear to auscultation, breathing unlabored, no rales  or wheezes  Abdomen: Positive bowel sounds, soft nontender to palpation, nondistended  Extremities: No clubbing, cyanosis, or edema  Skin: No evidence of breakdown except small area on L forearm, no evidence of rash  Neurologic: dysconjugate. Right facial droop. , motor strength is 5/5 in Left deltoid, bicep, tricep, grip, hip flexor, knee extensors, ankle dorsiflexor and plantar flexor  3 minus/5 in the left biceps triceps and deltoid 4 minus at grip, 4 minus right hip flexor knee extensor ankle dorsiflexor plantar flexor     Assessment/Plan: 1. Functional deficits secondary to Left CR, Lentiform nucleus, temporal infarct which require 3+ hours per day of interdisciplinary therapy in a comprehensive inpatient rehab setting. Physiatrist is providing close team supervision and 24 hour management of active medical problems listed below. Physiatrist and rehab team continue to assess barriers to discharge/monitor patient progress toward functional and medical goals. FIM: FIM - Bathing Bathing Steps Patient Completed: Chest;Right Arm;Abdomen;Front perineal area;Buttocks;Right upper leg;Left upper leg;Right lower leg (including foot) Bathing: 4: Min-Patient completes 8-9 6f10 parts or 75+ percent (max assist for standing  balance)  FIM - Upper Body Dressing/Undressing Upper body dressing/undressing steps patient completed: Thread/unthread left sleeve of pullover shirt/dress Upper body dressing/undressing: 2: Max-Patient completed 25-49% of tasks FIM - Lower Body Dressing/Undressing Lower body dressing/undressing: 1: Total-Patient completed less than 25% of tasks  FIM - TMusicianDevices: Grab bar or rail for support Toileting: 1: Total-Patient completed zero steps, helper did all 3  FIM - TRadio producerDevices: Grab bars Toilet Transfers: 3-To toilet/BSC: Mod A (lift or lower assist);3-From toilet/BSC: Mod A (lift or lower assist)  FIM - BEngineer, siteAssistive Devices: Arm rests Bed/Chair Transfer: 5: Supine > Sit: Supervision (verbal cues/safety issues);2: Bed > Chair or W/C: Max A (lift and lower assist);2: Chair or W/C > Bed: Max A (lift and lower assist)  FIM - Locomotion: Wheelchair Distance: 150 Locomotion: Wheelchair: 4: Travels 150 ft or more: maneuvers on rugs and over door sillls with minimal assistance (Pt.>75%) FIM - Locomotion: Ambulation Locomotion: Ambulation Assistive Devices: Other (comment) (3 musketeers) Ambulation/Gait Assistance: 1: +2 Total assist Locomotion: Ambulation: 1: Two helpers (10 ft)  Comprehension Comprehension Mode: Auditory Comprehension: 5-Understands basic 90% of the time/requires cueing < 10% of the time  Expression Expression Mode: Verbal Expression: 2-Expresses basic 25 - 49% of the time/requires cueing 50 - 75% of the time. Uses single words/gestures.  Social Interaction Social Interaction: 4-Interacts appropriately 75 - 89% of the time - Needs redirection for appropriate language or to initiate interaction.  Problem Solving Problem Solving: 4-Solves basic 75 - 89% of the time/requires cueing 10 - 24% of the time  Memory Memory: 4-Recognizes or recalls 75 - 89% of the  time/requires cueing 10 - 24% of the time   Medical Problem List and Plan:  1. Functional deficits secondary to embolic infarcts left corona radiata, left lentiform nucleus, left temporal lobe with R Hemiparesis and aphasia  2. DVT Prophylaxis/Anticoagulation: Pharmaceutical: Lovenox  3. Pain Management: Tylenol 650 mg every 4 hours when necessary  4. Mood: Monitor for post stroke depression  5. Neuropsych: This patient is not capable of making decisions on his own behalf.  6. HTN: normotensive 7. DM type 2: check cbg's to start. Initiate ssi coverage 8. Seizure disorder: gabapentin 9. FEN: encourage fluids---re-check bmet wednesday  LOS (Days) 3 A FACE TO FACE EVALUATION WAS PERFORMED  SWARTZ,ZACHARY T 12/20/2013, 8:55 AM

## 2013-12-20 NOTE — Progress Notes (Signed)
Speech Language Pathology Daily Session Note  Patient Details  Name: [name] MRN: [id number] Date of Birth: 1971-03-07  Today's Date: 12/20/2013 Time: 1450-1500 Time Calculation (min): 10 min  Short Term Goals: Week 1: SLP Short Term Goal 1 (Week 1): Pt will utilize swallowing compensatory strategies with Mod I to minimize overt s/s of aspiration.  SLP Short Term Goal 2 (Week 1): Pt will demonstrate effective mastication of regular textures with Mod I without overt s/s of aspiration.  SLP Short Term Goal 3 (Week 1): Pt will attend to the right field of enviornment during functional tasks with Min supervision cues.  SLP Short Term Goal 4 (Week 1): Pt will demonstrate functional problem solving for basic and familiar tasks with supervision mulimodal cues.  SLP Short Term Goal 5 (Week 1): Pt will self-monitor and correct verbal errors with Mod  A multimodal cues.   Skilled Therapeutic Interventions: Skilled treatment session shortened due to being off unit for procedure; patent missed 35 minutes of skilled treatment.  SLP facilitated session with dysphagia education with patient and brother.  SLP educated on restrictions and need for use of safe swallow percautions.  Patient and brother verbalized that coughing has been occuring and that pocketing appeared to be cause.  SLP educated on consistent use of lingual/mannual sweep to manage pocketing.  Brother verbalized agreement and stated that he was comfortable providing full supervision during PO intake.  Patient consumed thin liquids via cup with supervision cues for pace, which effectively prevented overt s/s of aspiration.    FIM:  Comprehension Comprehension Mode: Auditory Comprehension: 4-Understands basic 75 - 89% of the time/requires cueing 10 - 24% of the time Expression Expression Mode: Verbal Expression: 3-Expresses basic 50 - 74% of the time/requires cueing 25 - 50% of the time. Needs to repeat parts of sentences. Social  Interaction Social Interaction: 3-Interacts appropriately 50 - 74% of the time - May be physically or verbally inappropriate. Problem Solving Problem Solving: 2-Solves basic 25 - 49% of the time - needs direction more than half the time to initiate, plan or complete simple activities Memory Memory: 3-Recognizes or recalls 50 - 74% of the time/requires cueing 25 - 49% of the time  Pain Pain Assessment Pain Assessment: No/denies pain  Therapy/Group: Individual Therapy  Charlane Ferretti., CCC-SLP [phone]  [name] 12/20/2013, 3:31 PM

## 2013-12-21 ENCOUNTER — Inpatient Hospital Stay (HOSPITAL_COMMUNITY): Payer: Medicare Other | Admitting: Speech Pathology

## 2013-12-21 ENCOUNTER — Encounter (HOSPITAL_COMMUNITY): Payer: Medicare Other | Admitting: Occupational Therapy

## 2013-12-21 ENCOUNTER — Inpatient Hospital Stay (HOSPITAL_COMMUNITY): Payer: Medicare Other | Admitting: Physical Therapy

## 2013-12-21 ENCOUNTER — Inpatient Hospital Stay (HOSPITAL_COMMUNITY): Payer: Medicare Other | Admitting: *Deleted

## 2013-12-21 LAB — GLUCOSE, CAPILLARY
GLUCOSE-CAPILLARY: 209 mg/dL — AB (ref 70–99)
GLUCOSE-CAPILLARY: 195 mg/dL — AB (ref 70–99)
Glucose-Capillary: 168 mg/dL — ABNORMAL HIGH (ref 70–99)
Glucose-Capillary: 137 mg/dL — ABNORMAL HIGH (ref 70–99)

## 2013-12-21 MED ORDER — METFORMIN HCL 500 MG PO TABS
500.0000 mg | ORAL_TABLET | Freq: Two times a day (BID) | ORAL | Status: DC
  Administered 2013-12-21 (×2): 500 mg via ORAL
  Filled 2013-12-21 (×5): qty 1

## 2013-12-21 NOTE — Progress Notes (Signed)
Speech Language Pathology Daily Session Note  Patient Details  Name: [name] MRN: [id number] Date of Birth: [birth year]  Today's Date: 12/21/2013 Time: 1405-1500 Time Calculation (min): 55 min  Short Term Goals: Week 1: SLP Short Term Goal 1 (Week 1): Pt will utilize swallowing compensatory strategies with Mod I to minimize overt s/s of aspiration.  SLP Short Term Goal 2 (Week 1): Pt will demonstrate effective mastication of regular textures with Mod I without overt s/s of aspiration.  SLP Short Term Goal 3 (Week 1): Pt will attend to the right field of enviornment during functional tasks with Min supervision cues.  SLP Short Term Goal 4 (Week 1): Pt will demonstrate functional problem solving for basic and familiar tasks with supervision mulimodal cues.  SLP Short Term Goal 5 (Week 1): Pt will self-monitor and correct verbal errors with Mod  A multimodal cues.   Skilled Therapeutic Interventions: Skilled treatment session focused on addressing dysphagia and cognitive-linguistic goals.  Upon entering room patient was consuming lunch of Dys 3 textures and thin liquids via cup with no straws.  SLP provided Min assist cues for portion control, pacing and use of liquid wash.  Patient demonstrated dry cough x1 which SLP suspects was not related to dysphagia.  SLP also facilitated session with task that required patient to sequence 4-step picture cards which he did with Supervision cues and labeled steps with Mod sentence completion and phonemic cues.  Continue with current plan of care.   FIM:  Comprehension Comprehension Mode: Auditory Comprehension: 5-Understands basic 90% of the time/requires cueing < 10% of the time Expression Expression Mode: Verbal Expression: 3-Expresses basic 50 - 74% of the time/requires cueing 25 - 50% of the time. Needs to repeat parts of sentences. Social Interaction Social Interaction: 3-Interacts appropriately 50 - 74% of the time - May be physically or  verbally inappropriate. Problem Solving Problem Solving: 3-Solves basic 50 - 74% of the time/requires cueing 25 - 49% of the time Memory Memory: 3-Recognizes or recalls 50 - 74% of the time/requires cueing 25 - 49% of the time FIM - Eating Eating Activity: 4: Helper checks for pocketed food;5: Needs verbal cues/supervision  Pain Pain Assessment Pain Assessment: No/denies pain  Therapy/Group: Individual Therapy  Charlane Ferretti., CCC-SLP [phone]  BOWIE,MELISSA 12/21/2013, 5:15 PM

## 2013-12-21 NOTE — Progress Notes (Signed)
Physical Therapy Session Note  Patient Details  Name: [name] MRN: [id number] Date of Birth: [birth year]  Today's Date: 12/21/2013 Time: 1000-1100 and 0312-8118 Time Calculation (min): 60 min and 31 min  Short Term Goals: Week 1:  PT Short Term Goal 1 (Week 1): Pt will perform bed mobility with supervision. PT Short Term Goal 2 (Week 1): Pt will perform bed<>w/c transfers with mod A. PT Short Term Goal 3 (Week 1): Pt will maintain static standing balance x 1 min with min A.  PT Short Term Goal 4 (Week 1): Pt will perform gait x 25 ft using LRAD with mod A x 1.   Skilled Therapeutic Interventions/Progress Updates:    Treatment Session 1: Co-tx with rec therapist focusing on utilizing closed chain weight shifting and transitional movements to increase R-sided muscle activation, improve stability/independence with functional transfers.  Pt received seated in w/c with quick release belt on for safety accompanied by brother. Pt performed w/c mobility x30' in controlled environment with L hemi technique requiring supervision, increased time. Transported pt remaining distance to gym in w/c with total A for energy conservation. Pt performed gait x14' in controlled environment with L hallway hand rail and +2A for w/c follow. PT positioned under LUE providing manual facilitation of weight shift to L side, manual stabilization of R knee during RLE stance to prevent knee buckling, tactile cueing at R hip flexors to facilitate initiation of RLE advancement and increased R step length. Verbal cueing provided for upright posture, forward gaze with effective return demonstration. Increased time required to ambulate as compared with previous sessions secondary to pt frequently pausing to rest during trial. Performed multiple squat pivot transfers from bed<>w/c<>mat table (Bobath technique) with max A to L side, +2A to transfer to R side. During transfer to R side, rec therapist assisted in positioning hips in w/c  seat, as needed). Multimodal cueing for anterior weight shift, effective within-session carryover of hand placement.   See below for detailed description of NMR. Pt with multidirectional postural sway during NMR. When asked if pt felt dizzy/lightheaded, pt nodded head "yes". Attempted to take vital signs in seated and standing; however, pt unable to tolerate standing for duration necessary to obtain BP reading. See vital signs for detailed findings. RN notified of symptoms. Session ended in pt room, where pt was left seated in w/c with quick release belt on for safety, brother present, and all needs within reach.   Treatment Session 2: 1:1. Pt received semi-reclined in bed with brother present. Pt agreeable to therapy. Session focused on increasing pt independence with transfers to R side and facilitating effective movement pattern (focus on anterior weight shift, bilat knee flexion) with stand>sit transfers. Performed supine>sit with HOB flat using bed rails requiring mod A for bilat LE management and tactile cueing to initiate movement of LUE across midline. Transported pt to gym in w/c with Total A for energy conservation. See below for detailed description of NMR interventions. Multiple sit<>stand transfers with mod A, manual stabilization of R knee to prevent knee buckling. Pt performed final squat pivot transfer from mat table>w/c (to R side) with mod A when given verbal cueing to reach anterior for target. Session ended in pt room, where pt was left seated in w/c (p[er pt request) with R hald lap tray on, quick release belt on for safety, and all needs within reach.  Therapy Documentation Precautions:  Precautions Precautions: Fall Restrictions Weight Bearing Restrictions: No Vital Signs: Therapy Vitals Temp: 98.1 F (36.7  C) Pulse Rate: 69 Resp: 18 BP: 107/71 mmHg Patient Position (if appropriate): Lying Oxygen Therapy SpO2: 97 % O2 Device: None (Room air) Pain:  Pt reports no pain  during AM and PM sessions. Other Treatments: Treatments Neuromuscular Facilitation: Upper Extremity;Lower Extremity;Forced use;Activity to increase timing and sequencing;Activity to increase motor control;Activity to increase sustained activation;Activity to increase anterior-posterior weight shifting;Activity to increase coordination;Activity to increase lateral weight shifting NMR:  AM session: Seated EOM with manual facilitation of RUE weightbearing on mat table to increase muscle activation, manual stabilization of R knee. Pt performed LUE reaching anterior/across midline with partial stand to promote anterior weight shift, RUE/LE weightbearing. While in partial standing position, pt transitioned to LUE lateral reaching to increase pt stability with functional weight shifting, improve midline orientation. +2A required for manual stabilization of RUE/LE and for postural stability during activity.  PM session: In standing with PT providing mod A at trunk for stability, performed LUE reaching across midline for horseshoes, followed by LUE anterolateral reaching to increase weight shift to L side. Manual stabilization of R knee provided throughout to prevent R knee buckling. Activity focused on functional lateral weight shifting.  See FIM for current functional status  Therapy/Group: Individual Therapy and Co-Treatment  Hobble, Lorenda IshiharaBlair A 12/21/2013, 4:21 PM

## 2013-12-21 NOTE — Progress Notes (Signed)
Inpatient Diabetes Program Recommendations  AACE/ADA: New Consensus Statement on Inpatient Glycemic Control (2013)  Target Ranges:  Prepandial:   less than 140 mg/dL      Peak postprandial:   less than 180 mg/dL (1-2 hours)      Critically ill patients:  140 - 180 mg/dL   Results for [name], [name] (MRN [id number]) as of 12/21/2013 13:30  Ref. Range 12/21/2013 07:21 12/21/2013 11:15  Glucose-Capillary Latest Range: 70-99 mg/dL 846 (H) 962 (H)    Diabetes history:Type 2 Outpatient Diabetes medications: Glucotrol 10mg  bid and Metformin 500mg  bid Current orders for Inpatient glycemic control: Novolog 0-15units with meals  Recent CBG remain elevated.    Recommend adding 5mg  Glucotrol bid and titrate dose upward if needed.    Susette Racer, RN, BA, MHA, CDE Diabetes Coordinator Inpatient Diabetes Program  [phone] (Team Pager) [phone] [facility]) 12/21/2013 1:31 PM

## 2013-12-21 NOTE — Progress Notes (Signed)
Recreational Therapy Assessment and Plan  Patient Details  Name: [name] MRN: [id number] Date of Birth: 08-12-1970 Today's Date: 12/21/2013  Rehab Potential: Good ELOS: 3 weeks   Assessment Clinical Impression: Problem List:  Patient Active Problem List    Diagnosis  Date Noted   .  CVA (cerebral infarction)  12/17/2013   .  HTN (hypertension)  12/17/2013   .  Diabetes  12/17/2013   .  Cardiomyopathy  12/17/2013    Past Medical History:  Past Medical History   Diagnosis  Date   .  Hypertension    .  Diabetes mellitus without complication    .  Seizures    .  Stroke     Past Surgical History: No past surgical history on file.  Assessment & Plan  Clinical Impression: Patient is a 43 year old male with history of DM type 2, seizure disorder, HTN, prior CVA who was admitted to [facility] on 12/14/13 with weakness X 2 days and difficulty talking. Patient had been out of BP medications X 2 1/2 months. CT head with stable hydrocephalus. 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 Carotid dopplers with minimal plaque and no significant stenosis. 2D echo with mildly decreased LVF with moderately increased LV posterior wall and EF 40-45%. 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 He was placed on ASA and Plavix per neurology recommendations and TEE ordered to rule out SOE. TEE done today revealing normal LVF, no thrombi in left atrium, left ventricle or apex but smoke s/o low flow state, negative bubble study. Cardiology (Dr. Neoma Laming) recommended anticoagulation for secondary stroke prevention. Swallow evaluation done by ST due to oromotor weakness and DIII, thin liquids recommended due to pocketing. Patient with resultant right sided weakness, mild right inattention,  slurred speech and dysphagia. Blood pressures are improving with resumption of home meds. Therapies ongoing and he has had some improvement in right sided weakness. Patient transferred to [facility] on 12/17/2013.  Pt presents with decreased activity tolerance, decreased functional mobility, decreased balance, decreased coordination, right sided weakness, right inattention, decreased attention, decreased awareness, decreased problem solving, decreased safety awareness, decreased memory, delayed processing and expressive aphasia Limiting pt's independence with leisure/community pursuits.   Leisure History/Participation Premorbid leisure interest/current participation: Sports - Basketball;Sports - Baseball;Nature - Audiological scientist - Doctor, hospital - Production designer, theatre/television/film Other Leisure Interests: Architect / Spiritual Social interaction - Mood/Behavior: Cooperative Engineer, drilling for Education?: Yes Recreational Therapy Orientation Orientation -Reviewed with patient: Available activity resources Strengths/Weaknesses Patient Strengths/Abilities: Willingness to participate Patient weaknesses: Physical limitations TR Patient demonstrates impairments in the following area(s): Endurance;Motor;Safety  Plan Rec Therapy Plan Is patient appropriate for Therapeutic Recreation?: Yes Rehab Potential: Good Treatment times per week: Min 1 time per week >20 minutes Estimated Length of Stay: 3 weeks TR Treatment/Interventions: Adaptive equipment instruction;1:1 session;Balance/vestibular training;Functional mobility training;Community reintegration;Cognitive remediation/compensation;Patient/family education;Therapeutic activities;Recreation/leisure participation;Therapeutic exercise;UE/LE Coordination activities;Visual/perceptual remediation/compensation;Wheelchair propulsion/positioning  Recommendations for other services: None  Discharge Criteria: Patient will be discharged  from TR if patient refuses treatment 3 consecutive times without medical reason.  If treatment goals not met, if there is a change in medical status, if patient makes no progress towards goals or if patient is discharged from hospital.  The above assessment, treatment plan, treatment alternatives and goals were discussed and mutually agreed upon: by patient  [place] 12/21/2013, 2:16 PM

## 2013-12-21 NOTE — Progress Notes (Signed)
Social Work Patient ID: [name], male   DOB: Jul 26, 1970, 43 y.o.   MRN: [id number] Met with pt and brother to inform have checked with chaplain services they would only give him one food voucher and he has already received this. Have given him a subway gift card but he is aware he will need to come up with alternative arrangements for food.  He may need to go home and then return Once day or two prior to pt's discharge to learn his care and try to work in the meantime.  He will plan to go home and return closer to pt's discharge.

## 2013-12-21 NOTE — Progress Notes (Signed)
Occupational Therapy Session Note  Patient Details  Name: [name] MRN: [id number] Date of Birth: 1970-08-19  Today's Date: 12/21/2013 Time: 0223-3612 Time Calculation (min): 44 min  Short Term Goals: Week 1:  OT Short Term Goal 1 (Week 1): Pt will complete toilet transfer at mod assist OT Short Term Goal 2 (Week 1): Pt will complete 1 grooming task in standing with mod assist OT Short Term Goal 3 (Week 1): Pt will initiate use of RUE during self-care tasks 25% of time OT Short Term Goal 4 (Week 1): Pt will complete LB dressing with max assist  Skilled Therapeutic Interventions/Progress Updates:    Patient seen this am for OT intervention to address postural control in sitting and standing, functional use of right extremities, improve sustained motor activation  In right extremities. Patient with significant improvement in sitting balance this am.  Improved activation and timing with both elbow flexion and extension.    Therapy Documentation Precautions:  Precautions Precautions: Fall Restrictions Weight Bearing Restrictions: No   Pain:  No pain  See FIM for current functional status  Therapy/Group: Individual Therapy  Collier Salina 12/21/2013, 9:45 AM

## 2013-12-21 NOTE — Progress Notes (Signed)
Subjective/Complaints: 43 year old male with history of DM type 2, seizure disorder, HTN, prior CVA who was admitted to [facility] on 12/14/13 with weakness X 2 days and difficulty talking. Patient had been out of BP medications X 2 1/2 months. CT head with stable hydrocephalus. MRI brain done revealing acute infarct left corona radiata, lentiform nuclei, left posterior temporal lobe and chronic severe ventriculomegaly/hydrocephalus  Had a good day yesterday. Denies pain. Review of Systems - limited due to aphasia  Objective: Vital Signs: Blood pressure 122/78, pulse 70, temperature 97.5 F (36.4 C), temperature source Oral, resp. rate 20, SpO2 98.00%. No results found. Results for orders placed during the hospital encounter of 12/17/13 (from the past 72 hour(s))  CBC WITH DIFFERENTIAL     Status: Abnormal   Collection Time    12/20/13  7:50 AM      Result Value Ref Range   WBC 4.3  4.0 - 10.5 K/uL   RBC 4.74  4.22 - 5.81 MIL/uL   Hemoglobin 13.8  13.0 - 17.0 g/dL   HCT 42.5  39.0 - 52.0 %   MCV 89.7  78.0 - 100.0 fL   MCH 29.1  26.0 - 34.0 pg   MCHC 32.5  30.0 - 36.0 g/dL   RDW 13.6  11.5 - 15.5 %   Platelets 181  150 - 400 K/uL   Neutrophils Relative % 46  43 - 77 %   Neutro Abs 2.0  1.7 - 7.7 K/uL   Lymphocytes Relative 32  12 - 46 %   Lymphs Abs 1.4  0.7 - 4.0 K/uL   Monocytes Relative 16 (*) 3 - 12 %   Monocytes Absolute 0.7  0.1 - 1.0 K/uL   Eosinophils Relative 6 (*) 0 - 5 %   Eosinophils Absolute 0.3  0.0 - 0.7 K/uL   Basophils Relative 0  0 - 1 %   Basophils Absolute 0.0  0.0 - 0.1 K/uL  COMPREHENSIVE METABOLIC PANEL     Status: Abnormal   Collection Time    12/20/13  7:50 AM      Result Value Ref Range   Sodium 136 (*) 137 - 147 mEq/L   Potassium 4.2  3.7 - 5.3 mEq/L   Chloride 99  96 - 112 mEq/L   CO2 25  19 - 32 mEq/L   Glucose, Bld 205 (*) 70 - 99 mg/dL   BUN 24 (*) 6 - 23 mg/dL   Creatinine, Ser 1.19  0.50 - 1.35 mg/dL   Calcium 9.6  8.4 - 10.5 mg/dL   Total  Protein 7.1  6.0 - 8.3 g/dL   Albumin 3.7  3.5 - 5.2 g/dL   AST 13  0 - 37 U/L   ALT 18  0 - 53 U/L   Alkaline Phosphatase 61  39 - 117 U/L   Total Bilirubin 0.5  0.3 - 1.2 mg/dL   GFR calc non Af Amer 74 (*) >90 mL/min   GFR calc Af Amer 86 (*) >90 mL/min   Comment: (NOTE)     The eGFR has been calculated using the CKD EPI equation.     This calculation has not been validated in all clinical situations.     eGFR's persistently <90 mL/min signify possible Chronic Kidney     Disease.  GLUCOSE, CAPILLARY     Status: Abnormal   Collection Time    12/20/13 11:25 AM      Result Value Ref Range   Glucose-Capillary 254 (*) 70 -  99 mg/dL   Comment 1 Notify RN    GLUCOSE, CAPILLARY     Status: Abnormal   Collection Time    12/20/13  4:29 PM      Result Value Ref Range   Glucose-Capillary 139 (*) 70 - 99 mg/dL  GLUCOSE, CAPILLARY     Status: Abnormal   Collection Time    12/20/13  8:51 PM      Result Value Ref Range   Glucose-Capillary 173 (*) 70 - 99 mg/dL  GLUCOSE, CAPILLARY     Status: Abnormal   Collection Time    12/21/13  7:21 AM      Result Value Ref Range   Glucose-Capillary 168 (*) 70 - 99 mg/dL   Comment 1 Notify RN        Mood and affect are appropriate  Heart: Regular rate and rhythm no rubs murmurs or extra sounds  Lungs: Clear to auscultation, breathing unlabored, no rales or wheezes  Abdomen: Positive bowel sounds, soft nontender to palpation, nondistended  Extremities: No clubbing, cyanosis, or edema  Skin: No evidence of breakdown except small area on L forearm, no evidence of rash  Neurologic: dysconjugate. Right facial droop. , motor strength is 5/5 in Left deltoid, bicep, tricep, grip, hip flexor, knee extensors, ankle dorsiflexor and plantar flexor  3 minus/5 in the left biceps triceps and deltoid 4 minus at grip, 4 minus right hip flexor knee extensor ankle dorsiflexor plantar flexor     Assessment/Plan: 1. Functional deficits secondary to Left CR,  Lentiform nucleus, temporal infarct which require 3+ hours per day of interdisciplinary therapy in a comprehensive inpatient rehab setting. Physiatrist is providing close team supervision and 24 hour management of active medical problems listed below. Physiatrist and rehab team continue to assess barriers to discharge/monitor patient progress toward functional and medical goals. FIM: FIM - Bathing Bathing Steps Patient Completed: Chest;Right Arm;Abdomen;Front perineal area;Right upper leg;Left upper leg;Right lower leg (including foot) Bathing: 3: Mod-Patient completes 5-7 6f 10 parts or 50-74%  FIM - Upper Body Dressing/Undressing Upper body dressing/undressing steps patient completed: Thread/unthread left sleeve of pullover shirt/dress Upper body dressing/undressing: 2: Max-Patient completed 25-49% of tasks FIM - Lower Body Dressing/Undressing Lower body dressing/undressing: 1: Total-Patient completed less than 25% of tasks  FIM - Musician Devices: Grab bar or rail for support Toileting: 0: Activity did not occur  FIM - Radio producer Devices: Grab bars Toilet Transfers: 3-To toilet/BSC: Mod A (lift or lower assist);3-From toilet/BSC: Mod A (lift or lower assist)  FIM - Bed/Chair Transfer Bed/Chair Transfer Assistive Devices: Arm rests;Bed rails Bed/Chair Transfer: 2: Supine > Sit: Max A (lifting assist/Pt. 25-49%);2: Bed > Chair or W/C: Max A (lift and lower assist)  FIM - Locomotion: Wheelchair Distance: 150 Locomotion: Wheelchair: 4: Travels 150 ft or more: maneuvers on rugs and over door sillls with minimal assistance (Pt.>75%) FIM - Locomotion: Ambulation Locomotion: Ambulation Assistive Devices: Other (comment) (L hallway hand rail) Ambulation/Gait Assistance: 1: +2 Total assist;Other (comment) (+2A for w/c follow) Locomotion: Ambulation: 1: Two helpers (20')  Comprehension Comprehension Mode: Auditory Comprehension:  4-Understands basic 75 - 89% of the time/requires cueing 10 - 24% of the time  Expression Expression Mode: Verbal Expression: 3-Expresses basic 50 - 74% of the time/requires cueing 25 - 50% of the time. Needs to repeat parts of sentences.  Social Interaction Social Interaction: 3-Interacts appropriately 50 - 74% of the time - May be physically or verbally inappropriate.  Problem Solving Problem Solving: 2-Solves  basic 25 - 49% of the time - needs direction more than half the time to initiate, plan or complete simple activities  Memory Memory: 3-Recognizes or recalls 50 - 74% of the time/requires cueing 25 - 49% of the time   Medical Problem List and Plan:  1. Functional deficits secondary to embolic infarcts left corona radiata, left lentiform nucleus, left temporal lobe with R Hemiparesis and aphasia  2. DVT Prophylaxis/Anticoagulation: Pharmaceutical: Lovenox  3. Pain Management: Tylenol 650 mg every 4 hours when necessary  4. Mood: Monitor for post stroke depression  5. Neuropsych: This patient is not capable of making decisions on his own behalf.  6. HTN: normotensive 7. DM type 2: cbg's, SSI  -consider starting an oral agent ---resume glucophage 8. Seizure disorder: gabapentin 9. FEN: encourage fluids---re-check bmet tomorrow.  LOS (Days) 4 A FACE TO FACE EVALUATION WAS PERFORMED  SWARTZ,ZACHARY T 12/21/2013, 8:37 AM

## 2013-12-22 ENCOUNTER — Inpatient Hospital Stay (HOSPITAL_COMMUNITY): Payer: Medicare Other | Admitting: *Deleted

## 2013-12-22 ENCOUNTER — Encounter (HOSPITAL_COMMUNITY): Payer: Medicare Other | Admitting: Occupational Therapy

## 2013-12-22 ENCOUNTER — Inpatient Hospital Stay (HOSPITAL_COMMUNITY): Payer: Medicare Other | Admitting: Physical Therapy

## 2013-12-22 ENCOUNTER — Encounter (HOSPITAL_COMMUNITY): Payer: Medicare Other | Admitting: Speech Pathology

## 2013-12-22 DIAGNOSIS — G8110 Spastic hemiplegia affecting unspecified side: Secondary | ICD-10-CM

## 2013-12-22 DIAGNOSIS — I6330 Cerebral infarction due to thrombosis of unspecified cerebral artery: Secondary | ICD-10-CM

## 2013-12-22 LAB — URINALYSIS, ROUTINE W REFLEX MICROSCOPIC
BILIRUBIN URINE: NEGATIVE
Glucose, UA: 100 mg/dL — AB
HGB URINE DIPSTICK: NEGATIVE
KETONES UR: NEGATIVE mg/dL
Leukocytes, UA: NEGATIVE
NITRITE: NEGATIVE
Protein, ur: NEGATIVE mg/dL
Specific Gravity, Urine: 1.017 (ref 1.005–1.030)
UROBILINOGEN UA: 1 mg/dL (ref 0.0–1.0)
pH: 5 (ref 5.0–8.0)

## 2013-12-22 LAB — BASIC METABOLIC PANEL
BUN: 35 mg/dL — ABNORMAL HIGH (ref 6–23)
CO2: 26 mEq/L (ref 19–32)
Calcium: 9.5 mg/dL (ref 8.4–10.5)
Chloride: 94 mEq/L — ABNORMAL LOW (ref 96–112)
Creatinine, Ser: 1.8 mg/dL — ABNORMAL HIGH (ref 0.50–1.35)
GFR, EST AFRICAN AMERICAN: 52 mL/min — AB (ref >90)
GFR, EST NON AFRICAN AMERICAN: 45 mL/min — AB (ref >90)
GLUCOSE: 163 mg/dL — AB (ref 70–99)
POTASSIUM: 4.2 meq/L (ref 3.7–5.3)
Sodium: 133 mEq/L — ABNORMAL LOW (ref 137–147)

## 2013-12-22 LAB — GLUCOSE, CAPILLARY
GLUCOSE-CAPILLARY: 232 mg/dL — AB (ref 70–99)
GLUCOSE-CAPILLARY: 247 mg/dL — AB (ref 70–99)
Glucose-Capillary: 213 mg/dL — ABNORMAL HIGH (ref 70–99)
Glucose-Capillary: 212 mg/dL — ABNORMAL HIGH (ref 70–99)

## 2013-12-22 MED ORDER — SENNOSIDES-DOCUSATE SODIUM 8.6-50 MG PO TABS
1.0000 | ORAL_TABLET | Freq: Every day | ORAL | Status: DC
  Administered 2013-12-22 – 2014-01-06 (×16): 1 via ORAL
  Filled 2013-12-22 (×17): qty 1

## 2013-12-22 MED ORDER — LISINOPRIL 2.5 MG PO TABS
2.5000 mg | ORAL_TABLET | Freq: Every day | ORAL | Status: DC
  Administered 2013-12-22 – 2014-01-07 (×16): 2.5 mg via ORAL
  Filled 2013-12-22 (×20): qty 1

## 2013-12-22 MED ORDER — HYDROCHLOROTHIAZIDE 10 MG/ML ORAL SUSPENSION
6.2500 mg | Freq: Every day | ORAL | Status: DC
  Administered 2013-12-22 – 2014-01-06 (×14): 6.25 mg via ORAL
  Filled 2013-12-22 (×21): qty 1.25

## 2013-12-22 NOTE — Progress Notes (Signed)
Social Work Patient ID: [name], male   DOB: 11-05-1970, 43 y.o.   MRN: [id number] Met with pt and brother to discuss team conference goals-supervision/min level and discharge 7/3.  Brother reports they have to move by 7/1. Discussed he needs to go home and take care of obtaining an apartment and plan the move and then come back for family education prior to Pt's discharge.  He will plan to return home to take care of the housing issues.  Brother can see the progress pt is making and pt does like it when he is here, but Realizes he needs to take care of things at home.  Will continue to work on discharge plans and provide support to both.

## 2013-12-22 NOTE — Progress Notes (Signed)
Physical Therapy Session Note  Patient Details  Name: [name] MRN: [id number] Date of Birth: 12-Sep-1970  Today's Date: 12/22/2013 Time: 0930-1028 Time Calculation (min): 58 min  Short Term Goals: Week 1:  PT Short Term Goal 1 (Week 1): Pt will perform bed mobility with supervision. PT Short Term Goal 2 (Week 1): Pt will perform bed<>w/c transfers with mod A. PT Short Term Goal 3 (Week 1): Pt will maintain static standing balance x 1 min with min A.  PT Short Term Goal 4 (Week 1): Pt will perform gait x 25 ft using LRAD with mod A x 1.   Skilled Therapeutic Interventions/Progress Updates:    Pt received seated in w/c accompanied by brother. Session focused on increasing stability/independence with transfers to R side, with stand>sit transfers, and with gait. Transported pt to gym in w/c with total A for energy conservation. Performed squat pivot transfers (Bobath technique) to R side x3 trials without slide board with max A, tactile/verbal cueing for hand placement. Performed lateral scooting transfer to R side with slide board with mod A, increased time, manual facilitation of anterior weight shifting. Performed multiple sit<>stand transfers from neutral height mat table with mod A, tactile cueing at R knee to increase weightbearing, and manual facilitation of RUE weightbearing on mat table for forced use during transfers to increase muscle activation. See below for detailed description of NMR interventions.   Pt performed gait x15' in controlled environment with L hallway hand rail and +2A for w/c follow; pt performed ~50% of gait. PT positioned under LUE providing manual facilitation of weight shift to L side, manual stabilization of R knee during RLE stance to prevent knee buckling/recurvatum, min manual facilitation to initiate RLE advancement, and verbal cueing provided for forward gaze. Gait trial ended due to pt fatigue. Pt denied dizziness/lightheadedness during this session. Session  ended in pt room, where pt was left seated in w/c with brother present and all needs within reach. Brother verbally agreed to put quick release belt on pt for safety prior to leaving room.  Therapy Documentation Precautions:  Precautions Precautions: Fall Restrictions Weight Bearing Restrictions: No Pain:  Pt reported no pain during PT session. Locomotion : Ambulation Ambulation/Gait Assistance: 1: +2 Total assist;Other (comment) (+2A for w/c follow)  NMR:  Seated EOM with mirror anterior to pt for visual feedback, pt erformed multiple static standing trials with Max-Total A for stability. PT positioned to R of pt providing manual facilitation at R axilla, L ribcage for upright posture, manual stabilization of R knee to prevent buckling/recurvatum, and intermittent verbal cueing for use of mirror for visual feedback to self-correct posture. Pt expressing ability to "feel" lateral trunk lean to L without use of of mirror; however, pt stating, " I just can't fix it." Reaching target (positioned L of pt) effective in facilitating self-correction of R lateral trunk lean in standing.   See FIM for current functional status  Therapy/Group: Individual Therapy  Hobble, Lorenda Ishihara 12/22/2013, 12:58 PM

## 2013-12-22 NOTE — Progress Notes (Signed)
Occupational Therapy Session Note  Patient Details  Name: [name] MRN: [id number] Date of Birth: 04-23-1971  Today's Date: 12/22/2013 Time: 1345-1430 Time Calculation (min): 45 min  Short Term Goals: Week 1:  OT Short Term Goal 1 (Week 1): Pt will complete toilet transfer at mod assist OT Short Term Goal 2 (Week 1): Pt will complete 1 grooming task in standing with mod assist OT Short Term Goal 3 (Week 1): Pt will initiate use of RUE during self-care tasks 25% of time OT Short Term Goal 4 (Week 1): Pt will complete LB dressing with max assist  Skilled Therapeutic Interventions/Progress Updates:  No complaints of pain Co-Treatment with TR Patient received in w/c. Group therapy with one other patient. Focused skilled intervention on neuro re-education > RUE and therapeutic activity focusing on functional use of RUE, fine motor control/coordination, attention > right side, trunk/core control. Therapist used UE ranger with patient and engaged patient in card game with other patient. Also engaged patient in automatic counting task and simple conversation with other group member. Therapist propelled patient back to room and left with all needs within reach, quick release belt and hemi tray donned.  Precautions:  Precautions Precautions: Fall Restrictions Weight Bearing Restrictions: No  See FIM for current functional status  CLAY,PATRICIA 12/22/2013, 3:46 PM

## 2013-12-22 NOTE — Patient Care Conference (Signed)
Inpatient RehabilitationTeam Conference and Plan of Care Update Date: 12/22/2013   Time: 11;40 Am    Patient Name: [name]      Medical Record Number: [id number]  Date of Birth: 12-Jan-1971 Sex: Male         Room/Bed: 4M03C/4M03C-01 Payor Info: Payor: MEDICARE / Plan: MEDICARE PART A AND B / Product Type: *No Product type* /    Admitting Diagnosis: L  MCA AND BG CVA syncope  Admit Date/Time:  12/17/2013  2:43 PM Admission Comments: No comment available   Primary Diagnosis:  CVA (cerebral infarction) Principal Problem: CVA (cerebral infarction)  Patient Active Problem List   Diagnosis Date Noted  . CVA (cerebral infarction) 12/17/2013  . HTN (hypertension) 12/17/2013  . Diabetes 12/17/2013  . Cardiomyopathy 12/17/2013    Expected Discharge Date: Expected Discharge Date: 01/07/14  Team Members Present: Physician leading conference: Dr. Claudette Laws Social Worker Present: Dossie Der, LCSW Nurse Present: Carlean Purl, RN PT Present: Wanda Plump, PT;Blair Hobble, PT OT Present: Bretta Bang, OT SLP Present: Other (comment);Fae Pippin, SLP Joni Reining Page-Sp) PPS Coordinator present : Tora Duck, RN, CRRN;Becky Henrene Dodge, PT     Current Status/Progress Goal Weekly Team Focus  Medical   Aphasia, cognitive deficits, right hemiparesis  Improve mobility and communication  Improve transfers and balance   Bowel/Bladder    Incontinent of bowel and bladder. LBM 12/21/13 after suppository  Managed bowel and bladder  Timed toileting during the day. Condom cath @[facility]    Swallow/Nutrition/ Hydration   Dys.3 textures and thin liquids with full supervision   least restrictive PO intake  carryover of safe swallow strategies    ADL's   Max assist  supervision / min assist  Improve postural control, initiation, sustained muscle activation right extremities, family education with brother   Mobility   Mod-Max A bed mobility; Max-Total A transfers; +2A gait  Supervision to Min A   postural stability, bed mobility, functional transfers, gait, initiation of family education/training   Communication   Mod assist   Supervision   education and use of strategies   Safety/Cognition/ Behavioral Observations  Min-Mod assist  Supervision   increase self-monitoring and correcting and awareness   Pain   No c/o pain  <3  Monitor for nonverbal cues of pain   Skin   Loop recorder with dressing, intact  No additional skin breakdown  Monitor q shift      *See Care Plan and progress notes for long and short-term goals.  Barriers to Discharge: Ltd. resources at home, brother works part time, in process of moving    Possible Resolutions to Barriers:  Social work to assist with discharge planning    Discharge Planning/Teaching Needs:  Home with brother who plans to provide care, brother may go home and return prior to pt's discharge, so he can work some.  Discussed rehab will not be providing him food while here.      Team Discussion:  Timed toileting work on continency. Good carryover with therapies and making good progress. Yes/no not always accurate.  Brother is supportive and staying here with him, but may need to go home to work on living arrangements.  Revisions to Treatment Plan:  None   Continued Need for Acute Rehabilitation Level of Care: The patient requires daily medical management by a physician with specialized training in physical medicine and rehabilitation for the following conditions: Daily direction of a multidisciplinary physical rehabilitation program to ensure safe treatment while eliciting the highest outcome that is of  practical value to the patient.: Yes Daily medical management of patient stability for increased activity during participation in an intensive rehabilitation regime.: Yes Daily analysis of laboratory values and/or radiology reports with any subsequent need for medication adjustment of medical intervention for : Neurological  problems  Dupree, Lemar LivingsRebecca G 12/24/2013, 9:10 AM

## 2013-12-22 NOTE — Progress Notes (Signed)
Occupational Therapy Session Note  Patient Details  Name: [name] MRN: [id number] Date of Birth: Dec 31, 1970  Today's Date: 12/22/2013 Time: 7116-5790 Time Calculation (min): 43 min  Short Term Goals: Week 1:  OT Short Term Goal 1 (Week 1): Pt will complete toilet transfer at mod assist OT Short Term Goal 2 (Week 1): Pt will complete 1 grooming task in standing with mod assist OT Short Term Goal 3 (Week 1): Pt will initiate use of RUE during self-care tasks 25% of time OT Short Term Goal 4 (Week 1): Pt will complete LB dressing with max assist  Skilled Therapeutic Interventions/Progress Updates:    Patient seen this am for OT intervention to address postural control, safe transitional movements, and encourage improved functional use of right extremities.  First shower today, very difficult stand pivot transfer toward weaker right side.  Patient declined need to void x 2 at beginning of session, yet was incontinent of urine with little warning, RN aware.  Patient beginning to incorporate shoulder and elbow motion functionally in donning deodorant.    Therapy Documentation Precautions:  Precautions Precautions: Fall Restrictions Weight Bearing Restrictions: No   Pain: Pain Assessment Pain Assessment: No/denies pain Pain Score: 0-No pain  See FIM for current functional status  Therapy/Group: Individual Therapy  Collier Salina 12/22/2013, 12:07 PM

## 2013-12-22 NOTE — Progress Notes (Signed)
Speech Language Pathology Daily Session Note  Patient Details  Name: [name] MRN: [id number] Date of Birth: 02/26/1971  Today's Date: 12/22/2013 Time: 1130-1225 Time Calculation (min): 55 min  Short Term Goals: Week 1: SLP Short Term Goal 1 (Week 1): Pt will utilize swallowing compensatory strategies with Mod I to minimize overt s/s of aspiration.  SLP Short Term Goal 2 (Week 1): Pt will demonstrate effective mastication of regular textures with Mod I without overt s/s of aspiration.  SLP Short Term Goal 3 (Week 1): Pt will attend to the right field of enviornment during functional tasks with Min supervision cues.  SLP Short Term Goal 4 (Week 1): Pt will demonstrate functional problem solving for basic and familiar tasks with supervision mulimodal cues.  SLP Short Term Goal 5 (Week 1): Pt will self-monitor and correct verbal errors with Mod  A multimodal cues.   Skilled Therapeutic Interventions:  Pt was seen for skilled speech therapy (double dysphagia session) targeting dysphagia management, right attention, and verbal expression.  Pt was observed with presentations of his prescribed diet and exhibited no overt s/s of aspiration; however, he  benefited from min verbal cues for slow rate and small bites/sips.  Furthermore, pt exhibited effective oral manipulation and clearance of dys 3 textures with the use of a thin liquid wash between bites.  Pt also required supervision cuing to locate items on the right side of his tray due to his right inattention. During functional conversations pt benefited from increased processing time for functional communication in addition to semantic cues for functional word finding and to correct paraphasic errors.  Continue per current plan of care.    FIM:  Comprehension Comprehension Mode: Auditory Comprehension: 5-Understands basic 90% of the time/requires cueing < 10% of the time Expression Expression Mode: Verbal Expression: 2-Expresses basic 25 -  49% of the time/requires cueing 50 - 75% of the time. Uses single words/gestures. Social Interaction Social Interaction: 5-Interacts appropriately 90% of the time - Needs monitoring or encouragement for participation or interaction. Problem Solving Problem Solving: 3-Solves basic 50 - 74% of the time/requires cueing 25 - 49% of the time Memory Memory: 3-Recognizes or recalls 50 - 74% of the time/requires cueing 25 - 49% of the time FIM - Eating Eating Activity: 5: Needs verbal cues/supervision  Pain Pain Assessment Pain Assessment: No/denies pain  Therapy/Group: Individual Therapy  Jackalyn Lombard, M.A. CCC-SLP   [name], [name] 12/22/2013, 3:41 PM

## 2013-12-22 NOTE — Progress Notes (Addendum)
Subjective/Complaints: 43 year old male with history of DM type 2, seizure disorder, HTN, prior CVA who was admitted to [facility] on 12/14/13 with weakness X 2 days and difficulty talking. Patient had been out of BP medications X 2 1/2 months. CT head with stable hydrocephalus. MRI brain done revealing acute infarct left corona radiata, lentiform nuclei, left posterior temporal lobe and chronic severe ventriculomegaly/hydrocephalus  Aphasic, no c/os Review of Systems - unable to obtain secondary to aphasia  Objective: Vital Signs: Blood pressure 136/78, pulse 68, temperature 97.9 F (36.6 C), temperature source Oral, resp. rate 17, SpO2 100.00%. No results found. Results for orders placed during the hospital encounter of 12/17/13 (from the past 72 hour(s))  CBC WITH DIFFERENTIAL     Status: Abnormal   Collection Time    12/20/13  7:50 AM      Result Value Ref Range   WBC 4.3  4.0 - 10.5 K/uL   RBC 4.74  4.22 - 5.81 MIL/uL   Hemoglobin 13.8  13.0 - 17.0 g/dL   HCT 42.5  39.0 - 52.0 %   MCV 89.7  78.0 - 100.0 fL   MCH 29.1  26.0 - 34.0 pg   MCHC 32.5  30.0 - 36.0 g/dL   RDW 13.6  11.5 - 15.5 %   Platelets 181  150 - 400 K/uL   Neutrophils Relative % 46  43 - 77 %   Neutro Abs 2.0  1.7 - 7.7 K/uL   Lymphocytes Relative 32  12 - 46 %   Lymphs Abs 1.4  0.7 - 4.0 K/uL   Monocytes Relative 16 (*) 3 - 12 %   Monocytes Absolute 0.7  0.1 - 1.0 K/uL   Eosinophils Relative 6 (*) 0 - 5 %   Eosinophils Absolute 0.3  0.0 - 0.7 K/uL   Basophils Relative 0  0 - 1 %   Basophils Absolute 0.0  0.0 - 0.1 K/uL  COMPREHENSIVE METABOLIC PANEL     Status: Abnormal   Collection Time    12/20/13  7:50 AM      Result Value Ref Range   Sodium 136 (*) 137 - 147 mEq/L   Potassium 4.2  3.7 - 5.3 mEq/L   Chloride 99  96 - 112 mEq/L   CO2 25  19 - 32 mEq/L   Glucose, Bld 205 (*) 70 - 99 mg/dL   BUN 24 (*) 6 - 23 mg/dL   Creatinine, Ser 1.19  0.50 - 1.35 mg/dL   Calcium 9.6  8.4 - 10.5 mg/dL   Total Protein  7.1  6.0 - 8.3 g/dL   Albumin 3.7  3.5 - 5.2 g/dL   AST 13  0 - 37 U/L   ALT 18  0 - 53 U/L   Alkaline Phosphatase 61  39 - 117 U/L   Total Bilirubin 0.5  0.3 - 1.2 mg/dL   GFR calc non Af Amer 74 (*) >90 mL/min   GFR calc Af Amer 86 (*) >90 mL/min   Comment: (NOTE)     The eGFR has been calculated using the CKD EPI equation.     This calculation has not been validated in all clinical situations.     eGFR's persistently <90 mL/min signify possible Chronic Kidney     Disease.  GLUCOSE, CAPILLARY     Status: Abnormal   Collection Time    12/20/13 11:25 AM      Result Value Ref Range   Glucose-Capillary 254 (*) 70 - 99  mg/dL   Comment 1 Notify RN    GLUCOSE, CAPILLARY     Status: Abnormal   Collection Time    12/20/13  4:29 PM      Result Value Ref Range   Glucose-Capillary 139 (*) 70 - 99 mg/dL  GLUCOSE, CAPILLARY     Status: Abnormal   Collection Time    12/20/13  8:51 PM      Result Value Ref Range   Glucose-Capillary 173 (*) 70 - 99 mg/dL  GLUCOSE, CAPILLARY     Status: Abnormal   Collection Time    12/21/13  7:21 AM      Result Value Ref Range   Glucose-Capillary 168 (*) 70 - 99 mg/dL   Comment 1 Notify RN    GLUCOSE, CAPILLARY     Status: Abnormal   Collection Time    12/21/13 11:15 AM      Result Value Ref Range   Glucose-Capillary 209 (*) 70 - 99 mg/dL   Comment 1 Notify RN    GLUCOSE, CAPILLARY     Status: Abnormal   Collection Time    12/21/13  4:33 PM      Result Value Ref Range   Glucose-Capillary 195 (*) 70 - 99 mg/dL  GLUCOSE, CAPILLARY     Status: Abnormal   Collection Time    12/21/13  9:21 PM      Result Value Ref Range   Glucose-Capillary 137 (*) 70 - 99 mg/dL  BASIC METABOLIC PANEL     Status: Abnormal   Collection Time    12/22/13  3:57 AM      Result Value Ref Range   Sodium 133 (*) 137 - 147 mEq/L   Potassium 4.2  3.7 - 5.3 mEq/L   Chloride 94 (*) 96 - 112 mEq/L   CO2 26  19 - 32 mEq/L   Glucose, Bld 163 (*) 70 - 99 mg/dL   BUN 35 (*) 6 -  23 mg/dL   Creatinine, Ser 1.80 (*) 0.50 - 1.35 mg/dL   Comment: DELTA CHECK NOTED   Calcium 9.5  8.4 - 10.5 mg/dL   GFR calc non Af Amer 45 (*) >90 mL/min   GFR calc Af Amer 52 (*) >90 mL/min   Comment: (NOTE)     The eGFR has been calculated using the CKD EPI equation.     This calculation has not been validated in all clinical situations.     eGFR's persistently <90 mL/min signify possible Chronic Kidney     Disease.  GLUCOSE, CAPILLARY     Status: Abnormal   Collection Time    12/22/13  7:19 AM      Result Value Ref Range   Glucose-Capillary 232 (*) 70 - 99 mg/dL      Mood and affect are appropriate  Heart: Regular rate and rhythm no rubs murmurs or extra sounds  Lungs: Clear to auscultation, breathing unlabored, no rales or wheezes  Abdomen: Positive bowel sounds, soft nontender to palpation, nondistended  Extremities: No clubbing, cyanosis, or edema  Skin: No evidence of breakdown except small area on L forearm, no evidence of rash  Neurologic: Cranial nerves II through XII intact, motor strength is 5/5 in Left deltoid, bicep, tricep, grip, hip flexor, knee extensors, ankle dorsiflexor and plantar flexor  3 minus/5 in the left biceps triceps and deltoid 4 minus at grip, 4 minus right hip flexor knee extensor ankle dorsiflexor plantar flexor  Decreased sensation in Left hand   Assessment/Plan: 1. Functional  deficits secondary to Left CR, Lentiform nucleus, temporal infarct which require 3+ hours per day of interdisciplinary therapy in a comprehensive inpatient rehab setting. Physiatrist is providing close team supervision and 24 hour management of active medical problems listed below. Physiatrist and rehab team continue to assess barriers to discharge/monitor patient progress toward functional and medical goals. Team conference today please see physician documentation under team conference tab, met with team face-to-face to discuss problems,progress, and goals. Formulized  individual treatment plan based on medical history, underlying problem and comorbidities. FIM: FIM - Bathing Bathing Steps Patient Completed: Chest;Right upper leg;Right Arm;Left upper leg;Abdomen;Front perineal area;Buttocks Bathing: 3: Mod-Patient completes 5-7 [id number] parts or 50-74%  FIM - Upper Body Dressing/Undressing Upper body dressing/undressing steps patient completed: Thread/unthread left sleeve of pullover shirt/dress Upper body dressing/undressing: 0: Wears gown/pajamas-no public clothing FIM - Lower Body Dressing/Undressing Lower body dressing/undressing: 0: Wears gInterior and spatial designer FIM - TMusicianDevices: Grab bar or rail for support Toileting: 1: Two helpers  FIM - TRadio producerDevices: GProduct managerTransfers: 3-To toilet/BSC: Mod A (lift or lower assist);3-From toilet/BSC: Mod A (lift or lower assist)  FIM - Bed/Chair Transfer Bed/Chair Transfer Assistive Devices: Arm rests;Bed rails Bed/Chair Transfer: 3: Supine > Sit: Mod A (lifting assist/Pt. 50-74%/lift 2 legs;2: Bed > Chair or W/C: Max A (lift and lower assist);2: Chair or W/C > Bed: Max A (lift and lower assist)  FIM - Locomotion: Wheelchair Distance: 30 Locomotion: Wheelchair: 1: Total Assistance/staff pushes wheelchair (Pt<25%) FIM - Locomotion: Ambulation Locomotion: Ambulation Assistive Devices: Other (comment) (L hallway hand rail) Ambulation/Gait Assistance: 1: +2 Total assist;Other (comment) (+2A for w/c follow) Locomotion: Ambulation: 0: Activity did not occur  Comprehension Comprehension Mode: Auditory Comprehension: 5-Understands basic 90% of the time/requires cueing < 10% of the time  Expression Expression Mode: Verbal Expression: 3-Expresses basic 50 - 74% of the time/requires cueing 25 - 50% of the time. Needs to repeat parts of sentences.  Social Interaction Social Interaction: 3-Interacts appropriately 50 - 74% of the  time - May be physically or verbally inappropriate.  Problem Solving Problem Solving: 3-Solves basic 50 - 74% of the time/requires cueing 25 - 49% of the time  Memory Memory: 3-Recognizes or recalls 50 - 74% of the time/requires cueing 25 - 49% of the time   Medical Problem List and Plan:  1. Functional deficits secondary to embolic infarcts left corona radiata, left lentiform nucleus, left temporal lobe with R Hemiparesis and aphasia  2. DVT Prophylaxis/Anticoagulation: Pharmaceutical: Lovenox  3. Pain Management: Tylenol 650 mg every 4 hours when necessary  4. Mood: Monitor for post stroke depression  5. Neuropsych: This patient is not capable of making decisions on his own behalf.  6. HTN: 7. DM type 2: uncontrolled Glucophage restarted 6/16, may need to adjust dose 8. Seizure disorder:  9.  Acute renal insufficiency, suspect ACE-I, HCTZ +/- reduced fluid intake, will reduce lisinopril and HCTZ, enc po, monitor BMET LOS (Days) 5 A FACE TO FACE EVALUATION WAS PERFORMED  KIRSTEINS,ANDREW E 12/22/2013, 7:43 AM

## 2013-12-22 NOTE — Progress Notes (Signed)
Recreational Therapy Session Note  Patient Details  Name: [name] MRN: [id number] Date of Birth: 1970-07-18 Today's Date: 12/22/2013  Pain: no c/o Skilled Therapeutic Interventions/Progress Updates: Session focused on activity tolerance, sitting balance & RUE use.  Pt sat w/c level for tabletop activities including card game using RUE for glided movements.  Therapy/Group: Co-Treatment  Yogesh Cominsky 12/22/2013, 3:09 PM

## 2013-12-22 NOTE — Progress Notes (Signed)
PHARMACIST - PHYSICIAN COMMUNICATION DR:  Medical team CONCERNING:  METFORMIN SAFE ADMINISTRATION POLICY  RECOMMENDATION: Metformin has been placed on DISCONTINUE (rejected order) STATUS and should be reordered only after any of the conditions below are ruled out.  Current safety recommendations include avoiding metformin for a minimum of 48 hours after the patient's exposure to intravenous contrast media.  DESCRIPTION:  The Pharmacy Committee has adopted a policy that restricts the use of metformin in hospitalized patients until all the contraindications to administration have been ruled out. Specific contraindications are: [x] Serum creatinine ? 1.5 for males [] Serum creatinine ? 1.4 for females [] Shock, acute MI, sepsis, hypoxemia, dehydration [] Planned administration of intravenous iodinated contrast media [] Heart Failure patients with low EF [] Acute or chronic metabolic acidosis (including DKA)      

## 2013-12-23 ENCOUNTER — Inpatient Hospital Stay (HOSPITAL_COMMUNITY): Payer: Medicare Other | Admitting: Occupational Therapy

## 2013-12-23 ENCOUNTER — Encounter (HOSPITAL_COMMUNITY): Payer: Medicare Other | Admitting: Occupational Therapy

## 2013-12-23 ENCOUNTER — Inpatient Hospital Stay (HOSPITAL_COMMUNITY): Payer: Medicare Other | Admitting: Speech Pathology

## 2013-12-23 ENCOUNTER — Ambulatory Visit (HOSPITAL_COMMUNITY): Payer: Medicare Other | Admitting: *Deleted

## 2013-12-23 ENCOUNTER — Inpatient Hospital Stay (HOSPITAL_COMMUNITY): Payer: Medicare Other

## 2013-12-23 LAB — GLUCOSE, CAPILLARY
GLUCOSE-CAPILLARY: 251 mg/dL — AB (ref 70–99)
GLUCOSE-CAPILLARY: 175 mg/dL — AB (ref 70–99)
GLUCOSE-CAPILLARY: 153 mg/dL — AB (ref 70–99)
Glucose-Capillary: 186 mg/dL — ABNORMAL HIGH (ref 70–99)

## 2013-12-23 LAB — URINE CULTURE
Colony Count: NO GROWTH
Culture: NO GROWTH

## 2013-12-23 MED ORDER — GLIMEPIRIDE 2 MG PO TABS
2.0000 mg | ORAL_TABLET | Freq: Every day | ORAL | Status: DC
  Administered 2013-12-23 – 2013-12-25 (×3): 2 mg via ORAL
  Filled 2013-12-23 (×4): qty 1

## 2013-12-23 NOTE — Progress Notes (Signed)
Inpatient Diabetes Program Recommendations  AACE/ADA: New Consensus Statement on Inpatient Glycemic Control (2013)  Target Ranges:  Prepandial:   less than 140 mg/dL      Peak postprandial:   less than 180 mg/dL (1-2 hours)      Critically ill patients:  140 - 180 mg/dL   Reason for Visit: Hyperglycemia  Results for JAHQUEL, PLESE (MRN [id number]) as of 12/23/2013 10:50  Ref. Range 12/22/2013 07:19 12/22/2013 11:09 12/22/2013 16:39 12/22/2013 21:17 12/23/2013 07:37  Glucose-Capillary Latest Range: 70-99 mg/dL 060 (H) 045 (H) 997 (H) 212 (H) 186 (H)    Inpatient Diabetes Program Recommendations Correction (SSI): Increase Novolog to resistant and add HS correction HgbA1C: Please order HgbA1C to assess glycemic control prior to hospitalization Diet: Add CHO mod to Dysphagia 3 diet  Note: If CBGs stay in 200s, will need meal coverage insulin, begin with 3 units tidwc. Will follow while inpatient.  Thank you. Ailene Ards, RD, LDN, CDE Inpatient Diabetes Coordinator [phone]

## 2013-12-23 NOTE — Progress Notes (Signed)
Physical Therapy Session Note  Patient Details  Name: [name] MRN: [id number] Date of Birth: 1971-05-05  Today's Date: 12/23/2013 Time: 2426-8341 Time Calculation (min): 30 min  Short Term Goals: Week 1:  PT Short Term Goal 1 (Week 1): Pt will perform bed mobility with supervision. PT Short Term Goal 2 (Week 1): Pt will perform bed<>w/c transfers with mod A. PT Short Term Goal 3 (Week 1): Pt will maintain static standing balance x 1 min with min A.  PT Short Term Goal 4 (Week 1): Pt will perform gait x 25 ft using LRAD with mod A x 1.   Skilled Therapeutic Interventions/Progress Updates:    Pt received semi reclined in bed accompanied by brother. Pt agreeable to therapy. Missed initial 15 minutes secondary to pt request to finish breakfast. Pt brief noted to be soiled with urine; pt unaware. Performed supine>sit with supervision using rail with HOB flat. Multiple sit<>stand transfers from EOB with min A, static standing (3x30-60 second) during self-hygiene with LUE and brief change. RN notified of urinary incontinence. Performed squat pivot transfer from bed>w/c (to L side) with mod A, multimodal cueing for anterior weight shift; effective within-session carryover of hand placement, as pt required only subtle cueing. Performed multiple sit<>stand transfers from w/c with min-mod A; verbal/tactile cueing during stand>sit transfer focused on anterior weight shift, knee flexion, and controlled descent. Therapist departed with pt seated in w/c with quick release belt on for safety and all needs within reach. During session, pt reports feeling "edgy". When asked if he'd like to see neuropsychologist, pt nodded head "yes".   Therapy Documentation Precautions:  Precautions Precautions: Fall Restrictions Weight Bearing Restrictions: No General: Amount of Missed PT Time (min): 15 Minutes Missed Time Reason: Other (comment) (Pt eating breakfast for initial 15 minutes of session) Pain:  Pt  reported no pain during PT session.  See FIM for current functional status  Therapy/Group: Individual Therapy  Hobble, Lorenda Ishihara 12/23/2013, 12:35 PM

## 2013-12-23 NOTE — Progress Notes (Addendum)
Subjective/Complaints: 43 year old male with history of DM type 2, seizure disorder, HTN, prior CVA who was admitted to [facility] on 12/14/13 with weakness X 2 days and difficulty talking. Patient had been out of BP medications X 2 1/2 months. CT head with stable hydrocephalus. MRI brain done revealing acute infarct left corona radiata, lentiform nuclei, left posterior temporal lobe and chronic severe ventriculomegaly/hydrocephalus  Aphasic, no c/os Discussed BP and DM and D/C date Review of Systems - unable to obtain secondary to aphasia  Objective: Vital Signs: Blood pressure 135/84, pulse 65, temperature 98.1 F (36.7 C), temperature source Oral, resp. rate 18, SpO2 100.00%. No results found. Results for orders placed during the hospital encounter of 12/17/13 (from the past 72 hour(s))  CBC WITH DIFFERENTIAL     Status: Abnormal   Collection Time    12/20/13  7:50 AM      Result Value Ref Range   WBC 4.3  4.0 - 10.5 K/uL   RBC 4.74  4.22 - 5.81 MIL/uL   Hemoglobin 13.8  13.0 - 17.0 g/dL   HCT 42.5  39.0 - 52.0 %   MCV 89.7  78.0 - 100.0 fL   MCH 29.1  26.0 - 34.0 pg   MCHC 32.5  30.0 - 36.0 g/dL   RDW 13.6  11.5 - 15.5 %   Platelets 181  150 - 400 K/uL   Neutrophils Relative % 46  43 - 77 %   Neutro Abs 2.0  1.7 - 7.7 K/uL   Lymphocytes Relative 32  12 - 46 %   Lymphs Abs 1.4  0.7 - 4.0 K/uL   Monocytes Relative 16 (*) 3 - 12 %   Monocytes Absolute 0.7  0.1 - 1.0 K/uL   Eosinophils Relative 6 (*) 0 - 5 %   Eosinophils Absolute 0.3  0.0 - 0.7 K/uL   Basophils Relative 0  0 - 1 %   Basophils Absolute 0.0  0.0 - 0.1 K/uL  COMPREHENSIVE METABOLIC PANEL     Status: Abnormal   Collection Time    12/20/13  7:50 AM      Result Value Ref Range   Sodium 136 (*) 137 - 147 mEq/L   Potassium 4.2  3.7 - 5.3 mEq/L   Chloride 99  96 - 112 mEq/L   CO2 25  19 - 32 mEq/L   Glucose, Bld 205 (*) 70 - 99 mg/dL   BUN 24 (*) 6 - 23 mg/dL   Creatinine, Ser 1.19  0.50 - 1.35 mg/dL   Calcium 9.6   8.4 - 10.5 mg/dL   Total Protein 7.1  6.0 - 8.3 g/dL   Albumin 3.7  3.5 - 5.2 g/dL   AST 13  0 - 37 U/L   ALT 18  0 - 53 U/L   Alkaline Phosphatase 61  39 - 117 U/L   Total Bilirubin 0.5  0.3 - 1.2 mg/dL   GFR calc non Af Amer 74 (*) >90 mL/min   GFR calc Af Amer 86 (*) >90 mL/min   Comment: (NOTE)     The eGFR has been calculated using the CKD EPI equation.     This calculation has not been validated in all clinical situations.     eGFR's persistently <90 mL/min signify possible Chronic Kidney     Disease.  GLUCOSE, CAPILLARY     Status: Abnormal   Collection Time    12/20/13 11:25 AM      Result Value Ref Range  Glucose-Capillary 254 (*) 70 - 99 mg/dL   Comment 1 Notify RN    GLUCOSE, CAPILLARY     Status: Abnormal   Collection Time    12/20/13  4:29 PM      Result Value Ref Range   Glucose-Capillary 139 (*) 70 - 99 mg/dL  GLUCOSE, CAPILLARY     Status: Abnormal   Collection Time    12/20/13  8:51 PM      Result Value Ref Range   Glucose-Capillary 173 (*) 70 - 99 mg/dL  GLUCOSE, CAPILLARY     Status: Abnormal   Collection Time    12/21/13  7:21 AM      Result Value Ref Range   Glucose-Capillary 168 (*) 70 - 99 mg/dL   Comment 1 Notify RN    GLUCOSE, CAPILLARY     Status: Abnormal   Collection Time    12/21/13 11:15 AM      Result Value Ref Range   Glucose-Capillary 209 (*) 70 - 99 mg/dL   Comment 1 Notify RN    GLUCOSE, CAPILLARY     Status: Abnormal   Collection Time    12/21/13  4:33 PM      Result Value Ref Range   Glucose-Capillary 195 (*) 70 - 99 mg/dL  GLUCOSE, CAPILLARY     Status: Abnormal   Collection Time    12/21/13  9:21 PM      Result Value Ref Range   Glucose-Capillary 137 (*) 70 - 99 mg/dL  BASIC METABOLIC PANEL     Status: Abnormal   Collection Time    12/22/13  3:57 AM      Result Value Ref Range   Sodium 133 (*) 137 - 147 mEq/L   Potassium 4.2  3.7 - 5.3 mEq/L   Chloride 94 (*) 96 - 112 mEq/L   CO2 26  19 - 32 mEq/L   Glucose, Bld 163  (*) 70 - 99 mg/dL   BUN 35 (*) 6 - 23 mg/dL   Creatinine, Ser 1.80 (*) 0.50 - 1.35 mg/dL   Comment: DELTA CHECK NOTED   Calcium 9.5  8.4 - 10.5 mg/dL   GFR calc non Af Amer 45 (*) >90 mL/min   GFR calc Af Amer 52 (*) >90 mL/min   Comment: (NOTE)     The eGFR has been calculated using the CKD EPI equation.     This calculation has not been validated in all clinical situations.     eGFR's persistently <90 mL/min signify possible Chronic Kidney     Disease.  GLUCOSE, CAPILLARY     Status: Abnormal   Collection Time    12/22/13  7:19 AM      Result Value Ref Range   Glucose-Capillary 232 (*) 70 - 99 mg/dL  GLUCOSE, CAPILLARY     Status: Abnormal   Collection Time    12/22/13 11:09 AM      Result Value Ref Range   Glucose-Capillary 247 (*) 70 - 99 mg/dL   Comment 1 Notify RN    GLUCOSE, CAPILLARY     Status: Abnormal   Collection Time    12/22/13  4:39 PM      Result Value Ref Range   Glucose-Capillary 213 (*) 70 - 99 mg/dL  URINALYSIS, ROUTINE W REFLEX MICROSCOPIC     Status: Abnormal   Collection Time    12/22/13  4:48 PM      Result Value Ref Range   Color, Urine YELLOW  YELLOW  APPearance CLEAR  CLEAR   Specific Gravity, Urine 1.017  1.005 - 1.030   pH 5.0  5.0 - 8.0   Glucose, UA 100 (*) NEGATIVE mg/dL   Hgb urine dipstick NEGATIVE  NEGATIVE   Bilirubin Urine NEGATIVE  NEGATIVE   Ketones, ur NEGATIVE  NEGATIVE mg/dL   Protein, ur NEGATIVE  NEGATIVE mg/dL   Urobilinogen, UA 1.0  0.0 - 1.0 mg/dL   Nitrite NEGATIVE  NEGATIVE   Leukocytes, UA NEGATIVE  NEGATIVE   Comment: MICROSCOPIC NOT DONE ON URINES WITH NEGATIVE PROTEIN, BLOOD, LEUKOCYTES, NITRITE, OR GLUCOSE <1000 mg/dL.  GLUCOSE, CAPILLARY     Status: Abnormal   Collection Time    12/22/13  9:17 PM      Result Value Ref Range   Glucose-Capillary 212 (*) 70 - 99 mg/dL      Mood and affect are appropriate  Heart: Regular rate and rhythm no rubs murmurs or extra sounds  Lungs: Clear to auscultation, breathing  unlabored, no rales or wheezes  Abdomen: Positive bowel sounds, soft nontender to palpation, nondistended  Extremities: No clubbing, cyanosis, or edema  Skin: No evidence of breakdown except small area on L forearm, no evidence of rash  Neurologic: Cranial nerves II through XII intact, motor strength is 5/5 in Left deltoid, bicep, tricep, grip, hip flexor, knee extensors, ankle dorsiflexor and plantar flexor  3 minus/5 in the left biceps triceps and deltoid 4 minus at grip, 4 minus right hip flexor knee extensor ankle dorsiflexor plantar flexor  Decreased sensation in Right hand Speech reduced verbal output, able to follow basic commands Oriented to person, place and Month, off by one day on date  Assessment/Plan: 1. Functional deficits secondary to Left CR, Lentiform nucleus, temporal infarct which require 3+ hours per day of interdisciplinary therapy in a comprehensive inpatient rehab setting. Physiatrist is providing close team supervision and 24 hour management of active medical problems listed below. Physiatrist and rehab team continue to assess barriers to discharge/monitor patient progress toward functional and medical goals.  FIM: FIM - Bathing Bathing Steps Patient Completed: Chest;Right Arm;Abdomen;Front perineal area;Right upper leg;Left upper leg Bathing: 3: Mod-Patient completes 5-7 [id number] parts or 50-74%  FIM - Upper Body Dressing/Undressing Upper body dressing/undressing steps patient completed: Thread/unthread left sleeve of pullover shirt/dress Upper body dressing/undressing: 0: Wears gown/pajamas-no public clothing FIM - Lower Body Dressing/Undressing Lower body dressing/undressing: 0: Wears gInterior and spatial designer FIM - TMusicianDevices: Grab bar or rail for support Toileting: 0: No continent bowel/bladder events this shift  FIM - TRadio producerDevices: Grab bars Toilet Transfers: 3-To toilet/BSC: Mod A  (lift or lower assist);3-From toilet/BSC: Mod A (lift or lower assist)  FIM - Bed/Chair Transfer Bed/Chair Transfer Assistive Devices: Arm rests;Bed rails Bed/Chair Transfer: 3: Sit > Supine: Mod A (lifting assist/Pt. 50-74%/lift 2 legs);2: Bed > Chair or W/C: Max A (lift and lower assist)  FIM - Locomotion: Wheelchair Distance: 30 Locomotion: Wheelchair: 1: Total Assistance/staff pushes wheelchair (Pt<25%) FIM - Locomotion: Ambulation Locomotion: Ambulation Assistive Devices: Other (comment) (L hallway hand rail) Ambulation/Gait Assistance: 1: +2 Total assist;Other (comment) (+2A for w/c follow) Locomotion: Ambulation: 1: Two helpers  Comprehension Comprehension Mode: Auditory Comprehension: 5-Understands basic 90% of the time/requires cueing < 10% of the time  Expression Expression Mode: Verbal Expression: 2-Expresses basic 25 - 49% of the time/requires cueing 50 - 75% of the time. Uses single words/gestures.  Social Interaction Social Interaction: 5-Interacts appropriately 90% of the time - Needs monitoring  or encouragement for participation or interaction.  Problem Solving Problem Solving: 3-Solves basic 50 - 74% of the time/requires cueing 25 - 49% of the time  Memory Memory: 3-Recognizes or recalls 50 - 74% of the time/requires cueing 25 - 49% of the time   Medical Problem List and Plan:  1. Functional deficits secondary to embolic infarcts left corona radiata, left lentiform nucleus, left temporal lobe with R Hemiparesis and aphasia  2. DVT Prophylaxis/Anticoagulation: Pharmaceutical: Lovenox  3. Pain Management: Tylenol 650 mg every 4 hours when necessary  4. Mood: Monitor for post stroke depression  5. Neuropsych: This patient is not capable of making decisions on his own behalf.  6. HTN: 7. DM type 2: uncontrolled Glucophage d/ced secondary to elevated creat 8. Seizure disorder:  9.  Acute renal insufficiency, suspect ACE-I, HCTZ +/- reduced fluid intake, will  reduce lisinopril and HCTZ, enc po, monitor BMET , recheck in am LOS (Days) [place] E 12/23/2013, 6:43 AM

## 2013-12-23 NOTE — Progress Notes (Signed)
Occupational Therapy Weekly Progress Note  Patient Details  Name: CIRILO CANNER MRN: [id number] Date of Birth: [birth year]  Beginning of progress report period: December 17, 2013 End of progress report period: December 23, 2013  Today's Date: 12/23/2013 Time: 1415-1450 Time Calculation (min): 35 min  Patient has met 3 of 4 short term goals.  Due to improved functional movement in right lower extremity, improved static sitting balance, improved static standing balance, improved activity tolerance, improved attention to right side (body) and improved focused attention.   Patient continues to demonstrate the following deficits: sustained attention, attention to right body / environment, aphasia expressive>receptive, right hemiplegia, decreased sustained motor control in right extremities, and decreased balance and therefore will continue to benefit from skilled OT intervention to enhance overall performance with BADL.  Patient progressing toward long term goals..  Continue plan of care.  OT Short Term Goals Week 1:  OT Short Term Goal 1 (Week 1): Pt will complete toilet transfer at mod assist OT Short Term Goal 1 - Progress (Week 1): Met OT Short Term Goal 2 (Week 1): Pt will complete 1 grooming task in standing with mod assist OT Short Term Goal 2 - Progress (Week 1): Met OT Short Term Goal 3 (Week 1): Pt will initiate use of RUE during self-care tasks 25% of time OT Short Term Goal 3 - Progress (Week 1): Not met OT Short Term Goal 4 (Week 1): Pt will complete LB dressing with max assist OT Short Term Goal 4 - Progress (Week 1): Met Week 2:  OT Short Term Goal 1 (Week 2): Patient will transfer to toilet with min assist OT Short Term Goal 2 (Week 2): Patient will dress upper body with min assist  OT Short Term Goal 3 (Week 2): Patient will dress lower body with min assist OT Short Term Goal 4 (Week 2): Patient will transfer to tub bench with mod assist OT Short Term Goal 5 (Week 2): Patient will  bathe self with min assist  Skilled Therapeutic Interventions/Progress Updates:    Patient seen this pm for OT intervention to address postural control, and use of right upper extremity in support positions, e.g. Quadruped, right sidelying, sidearm prop.  Patient with active use of right upper extremity in these forced use, closed chain conditions, and then demonstrated increased volitional open chain movement at shoulder, elbow, forearm and wrist.    Therapy Documentation Precautions:  Precautions Precautions: Fall Restrictions Weight Bearing Restrictions: No  Pain:  Denies pain  See FIM for current functional status  Therapy/Group: Individual Therapy  Mariah Milling 12/23/2013, 4:06 PM

## 2013-12-23 NOTE — Progress Notes (Signed)
Occupational Therapy Session Note  Patient Details  Name: [name] MRN: [id number] Date of Birth: 10-01-1970  Today's Date: 12/23/2013 Time: 1017-1100 Time Calculation (min): 43 min  Short Term Goals: Week 1:  OT Short Term Goal 1 (Week 1): Pt will complete toilet transfer at mod assist OT Short Term Goal 2 (Week 1): Pt will complete 1 grooming task in standing with mod assist OT Short Term Goal 3 (Week 1): Pt will initiate use of RUE during self-care tasks 25% of time OT Short Term Goal 4 (Week 1): Pt will complete LB dressing with max assist  Skilled Therapeutic Interventions/Progress Updates:    Patient seen this am for OT intervention to address functional mobility, and pre-functional use of right upper extremity as needed for basic ADL function.  Patient difficult to arouse initially, not verbalizing, eyes partly open.  Patient vitals stable during this time.  Patient taken to gym for increased stimulation and arousal improved.  Worked to increase patient's ability to volitionally control movement in right arm.  Patient offers best response with forced use concept, and with entire body movement, e.g. Standing with right UE on compliant surface and right LE active.  Patient able to activate isolated shoulder adduction, internal rotation, elbow flexion/extension, wrist flexion/extension, and forearm pro/supination.    Therapy Documentation Precautions:  Precautions Precautions: Fall Restrictions Weight Bearing Restrictions: No Vital Signs:  BP:  118/72 seated Pain:  Denies pain   See FIM for current functional status  Therapy/Group: Individual Therapy  Collier Salina 12/23/2013, 1:23 PM

## 2013-12-23 NOTE — Progress Notes (Signed)
Speech Language Pathology Daily Session Note  Patient Details  Name: [name] MRN: [id number] Date of Birth: 04-Oct-1970  Today's Date: 12/23/2013 Time: Session 1: 7341-9379; session 2: 1330-1415 Time Calculation (min): Session 1: 55 min, Session 2: 45 min   Short Term Goals: Week 1: SLP Short Term Goal 1 (Week 1): Pt will utilize swallowing compensatory strategies with Mod I to minimize overt s/s of aspiration.  SLP Short Term Goal 2 (Week 1): Pt will demonstrate effective mastication of regular textures with Mod I without overt s/s of aspiration.  SLP Short Term Goal 3 (Week 1): Pt will attend to the right field of enviornment during functional tasks with Min supervision cues.  SLP Short Term Goal 4 (Week 1): Pt will demonstrate functional problem solving for basic and familiar tasks with supervision mulimodal cues.  SLP Short Term Goal 5 (Week 1): Pt will self-monitor and correct verbal errors with Mod  A multimodal cues.   Skilled Therapeutic Interventions:   Session 1: Pt was seen for skilled group speech therapy targeting dysphagia management.  Pt was observed with presentations of his prescribed diet with a delayed cough x3 which SLP suspects was related to large consecutive bites and sips.  SLP reviewed adn reinforced swallowing precautions with pt including slow rate, small bites/sips.  No overt s/s of aspiration with frequent cuing for use of swallowing precautions.     Session 2: Pt was seen for skilled speech therapy targeting verbal expression and recall of daily information.  Pt benefited from min-mod assist verbal cues for functional word finding during structured tasks for opposites and phrase completion.  During structured picture description tasks, pt was noted with circumlocutions in addition to paraphasic errors; however, he was stimulable for repetition of key words and phrases targeting more clear and concise functional communication.  During unstructured conversations  with SLP, pt recalled 1/2 recommended swallowing precautions addressed in earlier group therapy session with min assist, improving to 2/2 with mod assist. Continue per current plan of care.    FIM:  Comprehension Comprehension Mode: Auditory Comprehension: 5-Understands basic 90% of the time/requires cueing < 10% of the time Expression Expression Mode: Verbal Expression: 3-Expresses basic 50 - 74% of the time/requires cueing 25 - 50% of the time. Needs to repeat parts of sentences. Social Interaction Social Interaction: 4-Interacts appropriately 75 - 89% of the time - Needs redirection for appropriate language or to initiate interaction. Problem Solving Problem Solving: 3-Solves basic 50 - 74% of the time/requires cueing 25 - 49% of the time Memory Memory: 3-Recognizes or recalls 50 - 74% of the time/requires cueing 25 - 49% of the time FIM - Eating Eating Activity: 5: Needs verbal cues/supervision  Pain Pain Assessment Session 1:Pain Assessment: No/denies pain Session 2:Pain Assessment: No/denies pain  Therapy/Group: Individual Therapy and Group Therapy  Jackalyn Lombard, M.A. CCC-SLP   Darthy Manganelli, [name] 12/23/2013, 4:37 PM

## 2013-12-24 ENCOUNTER — Inpatient Hospital Stay (HOSPITAL_COMMUNITY): Payer: Medicare Other | Admitting: Physical Therapy

## 2013-12-24 ENCOUNTER — Ambulatory Visit (HOSPITAL_COMMUNITY): Payer: Medicare Other | Admitting: Speech Pathology

## 2013-12-24 ENCOUNTER — Encounter (HOSPITAL_COMMUNITY): Payer: Medicare Other | Admitting: Occupational Therapy

## 2013-12-24 DIAGNOSIS — I6330 Cerebral infarction due to thrombosis of unspecified cerebral artery: Secondary | ICD-10-CM

## 2013-12-24 DIAGNOSIS — G8110 Spastic hemiplegia affecting unspecified side: Secondary | ICD-10-CM

## 2013-12-24 LAB — BASIC METABOLIC PANEL
BUN: 21 mg/dL (ref 6–23)
CALCIUM: 9.5 mg/dL (ref 8.4–10.5)
CO2: 25 mEq/L (ref 19–32)
Chloride: 98 mEq/L (ref 96–112)
Creatinine, Ser: 1.23 mg/dL (ref 0.50–1.35)
GFR calc Af Amer: 83 mL/min — ABNORMAL LOW (ref >90)
GFR, EST NON AFRICAN AMERICAN: 71 mL/min — AB (ref >90)
Glucose, Bld: 158 mg/dL — ABNORMAL HIGH (ref 70–99)
Potassium: 4.6 mEq/L (ref 3.7–5.3)
Sodium: 136 mEq/L — ABNORMAL LOW (ref 137–147)

## 2013-12-24 LAB — GLUCOSE, CAPILLARY
Glucose-Capillary: 162 mg/dL — ABNORMAL HIGH (ref 70–99)
Glucose-Capillary: 285 mg/dL — ABNORMAL HIGH (ref 70–99)
Glucose-Capillary: 204 mg/dL — ABNORMAL HIGH (ref 70–99)

## 2013-12-24 MED ORDER — METFORMIN HCL 500 MG PO TABS
500.0000 mg | ORAL_TABLET | Freq: Two times a day (BID) | ORAL | Status: DC
Start: 2013-12-24 — End: 2014-01-07
  Administered 2013-12-24 – 2014-01-07 (×28): 500 mg via ORAL
  Filled 2013-12-24 (×32): qty 1

## 2013-12-24 MED ORDER — CITALOPRAM HYDROBROMIDE 10 MG PO TABS
10.0000 mg | ORAL_TABLET | Freq: Every day | ORAL | Status: DC
  Administered 2013-12-24 – 2014-01-07 (×15): 10 mg via ORAL
  Filled 2013-12-24 (×18): qty 1

## 2013-12-24 NOTE — Progress Notes (Signed)
Social Work Patient ID: [name], male   DOB: 1970-10-18, 43 y.o.   MRN: [id number] Met with pt's brother to discuss plan for him to go home and take care of their housing issues then return closer to pt's discharge to learn his care. He is planning to go home today or tomorrow.  Discussed with him they need a place to go to at discharge.

## 2013-12-24 NOTE — Progress Notes (Signed)
Speech Language Pathology Daily Session Note  Patient Details  Name: SAFAL KASTER MRN: [id number] Date of Birth: 03/13/1971  Today's Date: 12/24/2013 Time: 1550-1605 Time Calculation (min): 15 min  Short Term Goals: Week 2: SLP Short Term Goal 1 (Week 2): Pt will utilize swallowing compensatory strategies with Mod I to minimize overt s/s of aspiration.  SLP Short Term Goal 2 (Week 2): Pt will demonstrate effective mastication of regular textures with Mod I without overt s/s of aspiration.  SLP Short Term Goal 3 (Week 2): Pt will attend to the right field of environment during functional tasks with supervision cues.  SLP Short Term Goal 4 (Week 2): Pt will demonstrate functional problem solving for basic and familiar tasks with supervision mulimodal cues.  SLP Short Term Goal 5 (Week 2): Pt will self-monitor and correct verbal errors with min  A multimodal cues.   Skilled Therapeutic Interventions:  Pt was seen for additional speech therapy targeting expressive language.  Pt recalled speech therapist from previous therapy session this afternoon and was oriented to name badge by SLP to facilitate improved recall of names of therapists with min cuing.  Pt was 33% independent for word finding during a structured categorization task, improving to 50% accuracy with min assist, and 100% accuracy with mod assist semantic cuing.  Continue per current plan of care.   FIM:  Comprehension Comprehension Mode: Auditory Comprehension: 5-Understands basic 90% of the time/requires cueing < 10% of the time Expression Expression Mode: Verbal Expression: 3-Expresses basic 50 - 74% of the time/requires cueing 25 - 50% of the time. Needs to repeat parts of sentences. Social Interaction Social Interaction: 5-Interacts appropriately 90% of the time - Needs monitoring or encouragement for participation or interaction. Problem Solving Problem Solving: 3-Solves basic 50 - 74% of the time/requires cueing 25 - 49%  of the time Memory Memory: 3-Recognizes or recalls 50 - 74% of the time/requires cueing 25 - 49% of the time FIM - Eating Eating Activity: 5: Needs verbal cues/supervision  Pain Pain Assessment Pain Assessment: No/denies pain  Therapy/Group: Individual Therapy  Jackalyn Lombard, M.A. CCC-SLP   Page, [name] 12/24/2013, 4:21 PM

## 2013-12-24 NOTE — Progress Notes (Signed)
Physical Therapy Session Note  Patient Details  Name: [name] MRN: [id number] Date of Birth: 1970/08/30  Today's Date: 12/24/2013 Time: 1004-1030 Time Calculation (min): 26 min  Short Term Goals: Week 1:  PT Short Term Goal 1 (Week 1): Pt will perform bed mobility with supervision. PT Short Term Goal 2 (Week 1): Pt will perform bed<>w/c transfers with mod A. PT Short Term Goal 3 (Week 1): Pt will maintain static standing balance x 1 min with min A.  PT Short Term Goal 4 (Week 1): Pt will perform gait x 25 ft using LRAD with mod A x 1.   Skilled Therapeutic Interventions/Progress Updates:    Pt received seated in w/c with brother present; agreeable to therapy. Session focused on functional transfers and gait training. Pt performed w/c mobility x60' in controlled environment with L hemi technique and supervision, increased time. Donned ACE bandage at RLE to prevent R genu recurvatum. Performed gait x77' in controlled environment with +2A (3 musketeers assist) and w/c follow for safety.  Per pt gesturing to hold onto hallway hand rail with LUE, transitioned from 3 musketeers to LUE support at hallway hand rail with single PT positioned under RUE. Therapist positioned under pt's RUE manually facilitated forward pelvic rotation to initiate RLE advancement. ACE bandage effective in preventing R genu recurvatum. Multimodal cueing during gait trial addressed excessive R hip adduction. Followed seated rest break, performed multiple sit<>stand transfers from w/c with min A. Pt exhibits significant improvement in anterior weight shift and control of descent with stand>sit transfer. Session ended in pt room, where pt was left seated in w/c with R half lap tray on, brother present, and all needs within reach. Brother verbalized agreement to place quick release belt on pt for safety prior to leaving room.   Therapy Documentation Precautions:  Precautions Precautions: Fall Restrictions Weight Bearing  Restrictions: No Pain: Pain Assessment Pain Assessment: No/denies pain Locomotion : Ambulation Ambulation/Gait Assistance: 1: +2 Total assist;Other (comment) (+2A and w/c follow) Wheelchair Mobility Distance: 60   See FIM for current functional status  Therapy/Group: Individual Therapy  [name] 12/24/2013, 12:24 PM

## 2013-12-24 NOTE — Progress Notes (Signed)
Subjective/Complaints: 43 year old male with history of DM type 2, seizure disorder, HTN, prior CVA who was admitted to [facility] on 12/14/13 with weakness X 2 days and difficulty talking. Patient had been out of BP medications X 2 1/2 months. CT head with stable hydrocephalus. MRI brain done revealing acute infarct left corona radiata, lentiform nuclei, left posterior temporal lobe and chronic severe ventriculomegaly/hydrocephalus  Aphasic, no c/os Discussed BP and DM and D/C date Review of Systems - unable to obtain secondary to aphasia  Objective: Vital Signs: Blood pressure 118/77, pulse 67, temperature 98 F (36.7 C), temperature source Oral, resp. rate 18, SpO2 99.00%. No results found. Results for orders placed during the hospital encounter of 12/17/13 (from the past 72 hour(s))  GLUCOSE, CAPILLARY     Status: Abnormal   Collection Time    12/21/13  7:21 AM      Result Value Ref Range   Glucose-Capillary 168 (*) 70 - 99 mg/dL   Comment 1 Notify RN    GLUCOSE, CAPILLARY     Status: Abnormal   Collection Time    12/21/13 11:15 AM      Result Value Ref Range   Glucose-Capillary 209 (*) 70 - 99 mg/dL   Comment 1 Notify RN    GLUCOSE, CAPILLARY     Status: Abnormal   Collection Time    12/21/13  4:33 PM      Result Value Ref Range   Glucose-Capillary 195 (*) 70 - 99 mg/dL  GLUCOSE, CAPILLARY     Status: Abnormal   Collection Time    12/21/13  9:21 PM      Result Value Ref Range   Glucose-Capillary 137 (*) 70 - 99 mg/dL  BASIC METABOLIC PANEL     Status: Abnormal   Collection Time    12/22/13  3:57 AM      Result Value Ref Range   Sodium 133 (*) 137 - 147 mEq/L   Potassium 4.2  3.7 - 5.3 mEq/L   Chloride 94 (*) 96 - 112 mEq/L   CO2 26  19 - 32 mEq/L   Glucose, Bld 163 (*) 70 - 99 mg/dL   BUN 35 (*) 6 - 23 mg/dL   Creatinine, Ser 1.80 (*) 0.50 - 1.35 mg/dL   Comment: DELTA CHECK NOTED   Calcium 9.5  8.4 - 10.5 mg/dL   GFR calc non Af Amer 45 (*) >90 mL/min   GFR calc Af  Amer 52 (*) >90 mL/min   Comment: (NOTE)     The eGFR has been calculated using the CKD EPI equation.     This calculation has not been validated in all clinical situations.     eGFR's persistently <90 mL/min signify possible Chronic Kidney     Disease.  GLUCOSE, CAPILLARY     Status: Abnormal   Collection Time    12/22/13  7:19 AM      Result Value Ref Range   Glucose-Capillary 232 (*) 70 - 99 mg/dL  GLUCOSE, CAPILLARY     Status: Abnormal   Collection Time    12/22/13 11:09 AM      Result Value Ref Range   Glucose-Capillary 247 (*) 70 - 99 mg/dL   Comment 1 Notify RN    GLUCOSE, CAPILLARY     Status: Abnormal   Collection Time    12/22/13  4:39 PM      Result Value Ref Range   Glucose-Capillary 213 (*) 70 - 99 mg/dL  URINALYSIS, ROUTINE W  REFLEX MICROSCOPIC     Status: Abnormal   Collection Time    12/22/13  4:48 PM      Result Value Ref Range   Color, Urine YELLOW  YELLOW   APPearance CLEAR  CLEAR   Specific Gravity, Urine 1.017  1.005 - 1.030   pH 5.0  5.0 - 8.0   Glucose, UA 100 (*) NEGATIVE mg/dL   Hgb urine dipstick NEGATIVE  NEGATIVE   Bilirubin Urine NEGATIVE  NEGATIVE   Ketones, ur NEGATIVE  NEGATIVE mg/dL   Protein, ur NEGATIVE  NEGATIVE mg/dL   Urobilinogen, UA 1.0  0.0 - 1.0 mg/dL   Nitrite NEGATIVE  NEGATIVE   Leukocytes, UA NEGATIVE  NEGATIVE   Comment: MICROSCOPIC NOT DONE ON URINES WITH NEGATIVE PROTEIN, BLOOD, LEUKOCYTES, NITRITE, OR GLUCOSE <1000 mg/dL.  URINE CULTURE     Status: None   Collection Time    12/22/13  4:48 PM      Result Value Ref Range   Specimen Description URINE, CLEAN CATCH     Special Requests NONE     Culture  Setup Time       Value: 12/21/2013 17:24     Performed at [facility]       Value: NO GROWTH     Performed at [facility]   Culture       Value: NO GROWTH     Performed at [facility]   Report Status 12/23/2013 FINAL    GLUCOSE, CAPILLARY     Status: Abnormal   Collection  Time    12/22/13  9:17 PM      Result Value Ref Range   Glucose-Capillary 212 (*) 70 - 99 mg/dL  GLUCOSE, CAPILLARY     Status: Abnormal   Collection Time    12/23/13  7:37 AM      Result Value Ref Range   Glucose-Capillary 186 (*) 70 - 99 mg/dL   Comment 1 Notify RN    GLUCOSE, CAPILLARY     Status: Abnormal   Collection Time    12/23/13 11:31 AM      Result Value Ref Range   Glucose-Capillary 251 (*) 70 - 99 mg/dL   Comment 1 Notify RN    GLUCOSE, CAPILLARY     Status: Abnormal   Collection Time    12/23/13  4:25 PM      Result Value Ref Range   Glucose-Capillary 175 (*) 70 - 99 mg/dL  GLUCOSE, CAPILLARY     Status: Abnormal   Collection Time    12/23/13  8:50 PM      Result Value Ref Range   Glucose-Capillary 153 (*) 70 - 99 mg/dL      Mood and affect are appropriate  Heart: Regular rate and rhythm no rubs murmurs or extra sounds  Lungs: Clear to auscultation, breathing unlabored, no rales or wheezes  Abdomen: Positive bowel sounds, soft nontender to palpation, nondistended  Extremities: No clubbing, cyanosis, or edema  Skin: No evidence of breakdown except small area on L forearm, no evidence of rash  Neurologic: Cranial nerves II through XII intact, motor strength is 5/5 in Left deltoid, bicep, tricep, grip, hip flexor, knee extensors, ankle dorsiflexor and plantar flexor  3 minus/5 in the left biceps triceps and deltoid 4 minus at grip, 4 minus right hip flexor knee extensor ankle dorsiflexor plantar flexor  Decreased sensation in Right hand Speech reduced verbal output, able to follow basic commands Oriented to  person, place and Month, off by one day on date  Assessment/Plan: 1. Functional deficits secondary to Left CR, Lentiform nucleus, temporal infarct which require 3+ hours per day of interdisciplinary therapy in a comprehensive inpatient rehab setting. Physiatrist is providing close team supervision and 24 hour management of active medical problems listed  below. Physiatrist and rehab team continue to assess barriers to discharge/monitor patient progress toward functional and medical goals.  FIM: FIM - Bathing Bathing Steps Patient Completed: Chest;Right Arm;Abdomen;Front perineal area;Right upper leg;Left upper leg Bathing: 3: Mod-Patient completes 5-7 [id number] parts or 50-74%  FIM - Upper Body Dressing/Undressing Upper body dressing/undressing steps patient completed: Thread/unthread left sleeve of pullover shirt/dress;Put head through opening of pull over shirt/dress;Pull shirt over trunk Upper body dressing/undressing: 4: Min-Patient completed 75 plus % of tasks FIM - Lower Body Dressing/Undressing Lower body dressing/undressing steps patient completed: Thread/unthread right pants leg;Thread/unthread left pants leg Lower body dressing/undressing: 2: Max-Patient completed 25-49% of tasks  FIM - TMusicianDevices: Grab bar or rail for support Toileting: 0: No continent bowel/bladder events this shift  FIM - TRadio producerDevices: Grab bars Toilet Transfers: 3-To toilet/BSC: Mod A (lift or lower assist);3-From toilet/BSC: Mod A (lift or lower assist)  FIM - Bed/Chair Transfer Bed/Chair Transfer Assistive Devices: Arm rests;Bed rails Bed/Chair Transfer: 5: Supine > Sit: Supervision (verbal cues/safety issues);3: Bed > Chair or W/C: Mod A (lift or lower assist)  FIM - Locomotion: Wheelchair Distance: 30 Locomotion: Wheelchair: 0: Activity did not occur FIM - Locomotion: Ambulation Locomotion: Ambulation Assistive Devices: Other (comment) (L hallway hand rail) Ambulation/Gait Assistance: 1: +2 Total assist;Other (comment) (+2A for w/c follow) Locomotion: Ambulation: 0: Activity did not occur  Comprehension Comprehension Mode: Auditory Comprehension: 5-Understands basic 90% of the time/requires cueing < 10% of the time  Expression Expression Mode: Verbal Expression: 3-Expresses basic  50 - 74% of the time/requires cueing 25 - 50% of the time. Needs to repeat parts of sentences.  Social Interaction Social Interaction: 4-Interacts appropriately 75 - 89% of the time - Needs redirection for appropriate language or to initiate interaction.  Problem Solving Problem Solving: 3-Solves basic 50 - 74% of the time/requires cueing 25 - 49% of the time  Memory Memory: 3-Recognizes or recalls 50 - 74% of the time/requires cueing 25 - 49% of the time   Medical Problem List and Plan:  1. Functional deficits secondary to embolic infarcts left corona radiata, left lentiform nucleus, left temporal lobe with R Hemiparesis and aphasia  2. DVT Prophylaxis/Anticoagulation: Pharmaceutical: Lovenox  3. Pain Management: Tylenol 650 mg every 4 hours when necessary  4. Mood: Monitor for post stroke depression , flat affect, trial celexa 5. Neuropsych: This patient is not capable of making decisions on his own behalf.  6. HTN: 7. DM type 2: uncontrolled Glucophage d/ced secondary to elevated creat, started amaryl, monitor 8. Seizure disorder:  9.  Acute renal insufficiency, suspect ACE-I, HCTZ +/- reduced fluid intake, will reduce lisinopril and HCTZ, enc po, monitor BMET , recheck in am LOS (Days) [id number] 12/24/2013, 6:15 AM

## 2013-12-24 NOTE — Progress Notes (Signed)
Occupational Therapy Session Note  Patient Details  Name: [name] MRN: [id number] Date of Birth: 10/30/1970  Today's Date: 12/24/2013 Time: 0800-0900 Time Calculation (min): 60 min  Short Term Goals: Week 2:  OT Short Term Goal 1 (Week 2): Patient will transfer to toilet with min assist OT Short Term Goal 2 (Week 2): Patient will dress upper body with min assist  OT Short Term Goal 3 (Week 2): Patient will dress lower body with min assist OT Short Term Goal 4 (Week 2): Patient will transfer to tub bench with mod assist OT Short Term Goal 5 (Week 2): Patient will bathe self with min assist  Skilled Therapeutic Interventions/Progress Updates:  Patient seated in w/c and finishing breakfast with brother providing supervision for safe swallowing.  Brother reports that patient gives him a hard time when he asks patient not to take large bites and large sips.  Reviewed with patient need for safe swallowing techniques and he verbalized understanding.  Patient's brother stepped out and patient completed breakfast with need for one vc for small sips.  Engaged in self care retraining to include sponge bath (declined shower) and dressing.  Focused session on postural control, static and dynamic standing balance, sit><stands, attention to right, RUE and RLE forced use.  With hand over hand technique, patient used RUE to assist with bathing and applying deoderant, bear weight into sink during standing tasks, sit><stand with focus on shifting weight forward and midline throughout the transitional movement and weight shifts to the left with vcs and min-max assist.  Midline orientation in standing improved when encouraged to maintain a squat position secondary to patient extends left knee and falls/pushes to right.    Therapy Documentation Precautions:  Precautions Precautions: Fall Restrictions Weight Bearing Restrictions: No Pain: ADL: See FIM for current functional status  Therapy/Group:  Individual Therapy  SHAFFER, CHRISTINA 12/24/2013, 11:39 AM

## 2013-12-24 NOTE — Progress Notes (Signed)
Asked [name], PA about taking dressing off left chest from loop recorder implant.  She agreed that we could take dressing off now.  Removed tegaderm, steri strips still in place.  No drainage noted at this time.  Encouraged patient to let steri-strips fall off on their own and to avoid picking at site.  Will continue to monitor.  Dani Gobble, RN

## 2013-12-24 NOTE — Progress Notes (Signed)
Physical Therapy Session Note  Patient Details  Name: [name] MRN: [id number] Date of Birth: 19-Feb-1971  Today's Date: 12/24/2013 Time: 0160-1093 Time Calculation (min): 60 min  Short Term Goals: Week 1:  PT Short Term Goal 1 (Week 1): Pt will perform bed mobility with supervision. PT Short Term Goal 2 (Week 1): Pt will perform bed<>w/c transfers with mod A. PT Short Term Goal 3 (Week 1): Pt will maintain static standing balance x 1 min with min A.  PT Short Term Goal 4 (Week 1): Pt will perform gait x 25 ft using LRAD with mod A x 1.   Skilled Therapeutic Interventions/Progress Updates:    Pt received seated in w/c with brother present; asleep but easily awakened. Agreeable to therapy. Session focused on initiating use of rolling walker with gait training. Donned R AFO (Blue Rocker) to control R genu recurvatum. Performed gait x45', x30' in controlled environment with rolling walker, R hand orthosis requiring mod A, manual facilitation of weight shift to L; tactile cueing at R hip flexors and manual facilitation of forward R pelvic rotation to initiate R step length. Verbal cueing addressed R hip adduction during RLE advancement. R Blue Rocker effective in controlling R genu recurvatum; will consider heel wedge in future sessions. Performed multiple sit<>stand transfers from w/c and mat table with min-mod A. See below for detailed description of NMR interventions. Pt did agree to attempt to urinate in bathroom post-session; however, pt with urinary incontinence secondary to urgency. Nurse tech assisted in hygiene and clothing change. Session ended in pt room, where pt was left seated EOB with nurse tech present and all needs within reach.  Therapy Documentation Precautions:  Precautions Precautions: Fall Restrictions Weight Bearing Restrictions: No Vital Signs: Therapy Vitals Temp: 98.2 F (36.8 C) Temp src: Oral Pulse Rate: 65 Resp: 18 BP: 111/76 mmHg Patient Position (if  appropriate): Sitting Oxygen Therapy SpO2: 98 % O2 Device: None (Room air) Pain: Pain Assessment Pain Assessment: No/denies pain Locomotion : Ambulation Ambulation/Gait Assistance: 3: Mod assist Wheelchair Mobility Distance: 150  Other Treatments: Treatments Neuromuscular Facilitation: Right;Lower Extremity;Activity to increase motor control;Activity to increase timing and sequencing;Activity to increase sustained activation  R sidelying: RLE D2 flexion/extension PNF x20 reps rhythmic initiation. Transitioned to supine D2 flexion/extension 2x15 reps. Cueing focused on sustained R ankle dorsiflexion with concurrent R knee flexion and facilitating R hip abduction during D2 extension to promote more normalized gait pattern.  See FIM for current functional status  Therapy/Group: Individual Therapy  Hobble, Lorenda Ishihara 12/24/2013, 4:21 PM

## 2013-12-24 NOTE — Progress Notes (Addendum)
Speech Language Pathology Weekly Progress and Session Note  Patient Details  Name: [name] MRN: [id number] Date of Birth: 10-26-1970  Beginning of progress report period: December 17, 2013 End of progress report period: December 24, 2013  Today's Date: 12/24/2013 Time: 1130-1210 Time Calculation (min): 40 min  Short Term Goals: Week 1: SLP Short Term Goal 1 (Week 1): Pt will utilize swallowing compensatory strategies with Mod I to minimize overt s/s of aspiration.  SLP Short Term Goal 1 - Progress (Week 1): Progressing toward goal SLP Short Term Goal 2 (Week 1): Pt will demonstrate effective mastication of regular textures with Mod I without overt s/s of aspiration.  SLP Short Term Goal 2 - Progress (Week 1): Progressing toward goal SLP Short Term Goal 3 (Week 1): Pt will attend to the right field of enviornment during functional tasks with Min supervision cues.  SLP Short Term Goal 3 - Progress (Week 1): Met SLP Short Term Goal 4 (Week 1): Pt will demonstrate functional problem solving for basic and familiar tasks with supervision mulimodal cues.  SLP Short Term Goal 4 - Progress (Week 1): Progressing toward goal SLP Short Term Goal 5 (Week 1): Pt will self-monitor and correct verbal errors with Mod  A multimodal cues.  SLP Short Term Goal 5 - Progress (Week 1): Met    New Short Term Goals: Week 2: SLP Short Term Goal 1 (Week 2): Pt will utilize swallowing compensatory strategies with Mod I to minimize overt s/s of aspiration.  SLP Short Term Goal 2 (Week 2): Pt will demonstrate effective mastication of regular textures with Mod I without overt s/s of aspiration.  SLP Short Term Goal 3 (Week 2): Pt will attend to the right field of environment during functional tasks with supervision cues.  SLP Short Term Goal 4 (Week 2): Pt will demonstrate functional problem solving for basic and familiar tasks with supervision mulimodal cues.  SLP Short Term Goal 5 (Week 2): Pt will self-monitor and  correct verbal errors with min  A multimodal cues.   Weekly Progress Updates:   Patient has made functional gains and has met 2 of 5 short term goals this reporting period due to improved functional word finding and monitoring/correcting of verbal errors as well as right attention.  Currently, patient continues to require min assist for executive function and supervision for use of swallowing precautions during functional meals.  Patient and family education is ongoing. Patient would benefit from continued skilled SLP intervention to maximize cognitive-linguistic and swallowing function in order to maximize his functional independence prior to discharge.    Intensity: Minumum of 1-2 x/day, 30 to 90 minutes Frequency: 5 out of 7 days Duration/Length of Stay: 17-21 days Treatment/Interventions: Cueing hierarchy;Cognitive remediation/compensation;Internal/external aids;Environmental controls;Speech/Language facilitation;Therapeutic Activities;Patient/family education;Functional tasks;Dysphagia/aspiration precaution training;Therapeutic Exercise   Daily Session  Skilled Therapeutic Interventions: Pt was seen for skilled speech therapy targeting dysphagia management and right attention in the context of a functional meal.  SLP sat to the pt's right for the majority of his meal and set up pt's tray to facilitate visual scanning to the right to locate items needed to feed himself.  While pt exhibited limited eye contact with the SLP seated on his right, the pt was able to locate items on the right side of his tray with supervision verbal cues.  Pt presented with effective mastication and oral clearance of dys 3 textures with initial cuing for small bites and sips in a quiet environment; however, pt required supervision cues  for swallowing precautions in the presence of increased environmental distractions.  Pt exhibited no overt s/s of aspiration with solids or liquids for the duration of the treatment  session.     FIM:  Comprehension Comprehension Mode: Auditory Comprehension: 5-Understands basic 90% of the time/requires cueing < 10% of the time Expression Expression Mode: Verbal Expression: 3-Expresses basic 50 - 74% of the time/requires cueing 25 - 50% of the time. Needs to repeat parts of sentences. Social Interaction Social Interaction: 5-Interacts appropriately 90% of the time - Needs monitoring or encouragement for participation or interaction. Problem Solving Problem Solving: 3-Solves basic 50 - 74% of the time/requires cueing 25 - 49% of the time Memory Memory: 3-Recognizes or recalls 50 - 74% of the time/requires cueing 25 - 49% of the time FIM - Eating Eating Activity: 5: Needs verbal cues/supervision General  Amount of Missed SLP Time (min): 5 Minutes Pain Pain Assessment Pain Assessment: No/denies pain  Therapy/Group: Individual Therapy  [name], M.A. CCC-SLP  Page, [name] 12/24/2013, 1:21 PM

## 2013-12-24 NOTE — Progress Notes (Signed)
Social Work Lucy Chris, LCSW Social Worker Signed  Patient Care Conference Service date: 12/22/2013 1:09 PM  Inpatient RehabilitationTeam Conference and Plan of Care Update Date: 12/22/2013   Time: 11;40 Am     Patient Name: [name]       Medical Record Number: [id number]   Date of Birth: 1970/12/21 Sex: Male         Room/Bed: 4M03C/4M03C-01 Payor Info: Payor: MEDICARE / Plan: MEDICARE PART A AND B / Product Type: *No Product type* /   Admitting Diagnosis: L  MCA AND BG CVA syncope   Admit Date/Time:  12/17/2013  2:43 PM Admission Comments: No comment available   Primary Diagnosis:  CVA (cerebral infarction) Principal Problem: CVA (cerebral infarction)    Patient Active Problem List     Diagnosis  Date Noted   .  CVA (cerebral infarction)  12/17/2013   .  HTN (hypertension)  12/17/2013   .  Diabetes  12/17/2013   .  Cardiomyopathy  12/17/2013     Expected Discharge Date: Expected Discharge Date: 01/07/14  Team Members Present: Physician leading conference: Dr. Claudette Laws Social Worker Present: Dossie Der, LCSW Nurse Present: Carlean Purl, RN PT Present: Wanda Plump, PT;Blair Hobble, PT OT Present: Bretta Bang, OT SLP Present: Other (comment);Fae Pippin, SLP Joni Reining Page-Sp) PPS Coordinator present : Tora Duck, RN, CRRN;Becky Henrene Dodge, PT        Current Status/Progress  Goal  Weekly Team Focus   Medical     Aphasia, cognitive deficits, right hemiparesis  Improve mobility and communication  Improve transfers and balance   Bowel/Bladder     Incontinent of bowel and bladder. LBM 12/21/13 after suppository  Managed bowel and bladder  Timed toileting during the day. Condom cath @[facility]    Swallow/Nutrition/ Hydration     Dys.3 textures and thin liquids with full supervision   least restrictive PO intake  carryover of safe swallow strategies    ADL's     Max assist  supervision / min assist  Improve postural control, initiation, sustained muscle  activation right extremities, family education with brother   Mobility     Mod-Max A bed mobility; Max-Total A transfers; +2A gait  Supervision to Min A  postural stability, bed mobility, functional transfers, gait, initiation of family education/training   Communication     Mod assist   Supervision   education and use of strategies   Safety/Cognition/ Behavioral Observations    Min-Mod assist  Supervision   increase self-monitoring and correcting and awareness   Pain     No c/o pain  <3  Monitor for nonverbal cues of pain   Skin     Loop recorder with dressing, intact  No additional skin breakdown  Monitor q shift     *See Care Plan and progress notes for long and short-term goals.    Barriers to Discharge:  Ltd. resources at home, brother works part time, in process of moving      Possible Resolutions to Barriers:    Social work to assist with discharge planning      Discharge Planning/Teaching Needs:    Home with brother who plans to provide care, brother may go home and return prior to pt's discharge, so he can work some.  Discussed rehab will not be providing him food while here.      Team Discussion:    Timed toileting work on continency. Good carryover with therapies and making good progress. Yes/no not always accurate.  Brother is  supportive and staying here with him, but may need to go home to work on living arrangements.   Revisions to Treatment Plan:    None    Continued Need for Acute Rehabilitation Level of Care: The patient requires daily medical management by a physician with specialized training in physical medicine and rehabilitation for the following conditions: Daily direction of a multidisciplinary physical rehabilitation program to ensure safe treatment while eliciting the highest outcome that is of practical value to the patient.: Yes Daily medical management of patient stability for increased activity during participation in an intensive rehabilitation  regime.: Yes Daily analysis of laboratory values and/or radiology reports with any subsequent need for medication adjustment of medical intervention for : Neurological problems  Lucy ChrisDupree, Rebecca G 12/24/2013, 9:10 AM          Patient ID: Clayborne DanaEric S Blundell, male   DOB: 11-06-1970, 43 y.o.   MRN: [id number]

## 2013-12-25 DIAGNOSIS — I6330 Cerebral infarction due to thrombosis of unspecified cerebral artery: Secondary | ICD-10-CM

## 2013-12-25 DIAGNOSIS — G8110 Spastic hemiplegia affecting unspecified side: Secondary | ICD-10-CM

## 2013-12-25 LAB — GLUCOSE, CAPILLARY
GLUCOSE-CAPILLARY: 145 mg/dL — AB (ref 70–99)
Glucose-Capillary: 213 mg/dL — ABNORMAL HIGH (ref 70–99)
Glucose-Capillary: 170 mg/dL — ABNORMAL HIGH (ref 70–99)
Glucose-Capillary: 157 mg/dL — ABNORMAL HIGH (ref 70–99)
Glucose-Capillary: 201 mg/dL — ABNORMAL HIGH (ref 70–99)

## 2013-12-25 MED ORDER — GLIMEPIRIDE 4 MG PO TABS
4.0000 mg | ORAL_TABLET | Freq: Every day | ORAL | Status: DC
  Administered 2013-12-26 – 2014-01-07 (×13): 4 mg via ORAL
  Filled 2013-12-25 (×16): qty 1

## 2013-12-25 NOTE — Progress Notes (Signed)
Subjective/Complaints: 43 year old male with history of DM type 2, seizure disorder, HTN, prior CVA who was admitted to [facility] on 12/14/13 with weakness X 2 days and difficulty talking. Patient had been out of BP medications X 2 1/2 months. CT head with stable hydrocephalus. MRI brain done revealing acute infarct left corona radiata, lentiform nuclei, left posterior temporal lobe and chronic severe ventriculomegaly/hydrocephalus  Alert. No issues. Therapies progressing Review of Systems - limited due to language  Objective: Vital Signs: Blood pressure 117/60, pulse 66, temperature 98 F (36.7 C), temperature source Oral, resp. rate 19, SpO2 98.00%. No results found. Results for orders placed during the hospital encounter of 12/17/13 (from the past 72 hour(s))  GLUCOSE, CAPILLARY     Status: Abnormal   Collection Time    12/22/13 11:09 AM      Result Value Ref Range   Glucose-Capillary 247 (*) 70 - 99 mg/dL   Comment 1 Notify RN    GLUCOSE, CAPILLARY     Status: Abnormal   Collection Time    12/22/13  4:39 PM      Result Value Ref Range   Glucose-Capillary 213 (*) 70 - 99 mg/dL  URINALYSIS, ROUTINE W REFLEX MICROSCOPIC     Status: Abnormal   Collection Time    12/22/13  4:48 PM      Result Value Ref Range   Color, Urine YELLOW  YELLOW   APPearance CLEAR  CLEAR   Specific Gravity, Urine 1.017  1.005 - 1.030   pH 5.0  5.0 - 8.0   Glucose, UA 100 (*) NEGATIVE mg/dL   Hgb urine dipstick NEGATIVE  NEGATIVE   Bilirubin Urine NEGATIVE  NEGATIVE   Ketones, ur NEGATIVE  NEGATIVE mg/dL   Protein, ur NEGATIVE  NEGATIVE mg/dL   Urobilinogen, UA 1.0  0.0 - 1.0 mg/dL   Nitrite NEGATIVE  NEGATIVE   Leukocytes, UA NEGATIVE  NEGATIVE   Comment: MICROSCOPIC NOT DONE ON URINES WITH NEGATIVE PROTEIN, BLOOD, LEUKOCYTES, NITRITE, OR GLUCOSE <1000 mg/dL.  URINE CULTURE     Status: None   Collection Time    12/22/13  4:48 PM      Result Value Ref Range   Specimen Description URINE, CLEAN CATCH     Special Requests NONE     Culture  Setup Time       Value: 12/21/2013 17:24     Performed at [facility]       Value: NO GROWTH     Performed at [facility]   Culture       Value: NO GROWTH     Performed at [facility]   Report Status 12/23/2013 FINAL    GLUCOSE, CAPILLARY     Status: Abnormal   Collection Time    12/22/13  9:17 PM      Result Value Ref Range   Glucose-Capillary 212 (*) 70 - 99 mg/dL  GLUCOSE, CAPILLARY     Status: Abnormal   Collection Time    12/23/13  7:37 AM      Result Value Ref Range   Glucose-Capillary 186 (*) 70 - 99 mg/dL   Comment 1 Notify RN    GLUCOSE, CAPILLARY     Status: Abnormal   Collection Time    12/23/13 11:31 AM      Result Value Ref Range   Glucose-Capillary 251 (*) 70 - 99 mg/dL   Comment 1 Notify RN    GLUCOSE, CAPILLARY  Status: Abnormal   Collection Time    12/23/13  4:25 PM      Result Value Ref Range   Glucose-Capillary 175 (*) 70 - 99 mg/dL  GLUCOSE, CAPILLARY     Status: Abnormal   Collection Time    12/23/13  8:50 PM      Result Value Ref Range   Glucose-Capillary 153 (*) 70 - 99 mg/dL  BASIC METABOLIC PANEL     Status: Abnormal   Collection Time    12/24/13  6:19 AM      Result Value Ref Range   Sodium 136 (*) 137 - 147 mEq/L   Potassium 4.6  3.7 - 5.3 mEq/L   Chloride 98  96 - 112 mEq/L   CO2 25  19 - 32 mEq/L   Glucose, Bld 158 (*) 70 - 99 mg/dL   BUN 21  6 - 23 mg/dL   Creatinine, Ser 1.23  0.50 - 1.35 mg/dL   Calcium 9.5  8.4 - 10.5 mg/dL   GFR calc non Af Amer 71 (*) >90 mL/min   GFR calc Af Amer 83 (*) >90 mL/min   Comment: (NOTE)     The eGFR has been calculated using the CKD EPI equation.     This calculation has not been validated in all clinical situations.     eGFR's persistently <90 mL/min signify possible Chronic Kidney     Disease.  GLUCOSE, CAPILLARY     Status: Abnormal   Collection Time    12/24/13  7:10 AM      Result Value Ref Range    Glucose-Capillary 162 (*) 70 - 99 mg/dL   Comment 1 Notify RN    GLUCOSE, CAPILLARY     Status: Abnormal   Collection Time    12/24/13 11:35 AM      Result Value Ref Range   Glucose-Capillary 285 (*) 70 - 99 mg/dL   Comment 1 Notify RN    GLUCOSE, CAPILLARY     Status: Abnormal   Collection Time    12/24/13  4:31 PM      Result Value Ref Range   Glucose-Capillary 204 (*) 70 - 99 mg/dL  GLUCOSE, CAPILLARY     Status: Abnormal   Collection Time    12/24/13  8:59 PM      Result Value Ref Range   Glucose-Capillary 213 (*) 70 - 99 mg/dL  GLUCOSE, CAPILLARY     Status: Abnormal   Collection Time    12/25/13  7:08 AM      Result Value Ref Range   Glucose-Capillary 145 (*) 70 - 99 mg/dL   Comment 1 Notify RN        Mood and affect are appropriate  Heart: Regular rate and rhythm no rubs murmurs or extra sounds  Lungs: Clear to auscultation, breathing unlabored, no rales or wheezes  Abdomen: Positive bowel sounds, soft nontender to palpation, nondistended  Extremities: No clubbing, cyanosis, or edema  Skin: No evidence of breakdown except small area on L forearm, no evidence of rash  Neurologic: Cranial nerves II through XII intact, motor strength is 5/5 in Left deltoid, bicep, tricep, grip, hip flexor, knee extensors, ankle dorsiflexor and plantar flexor  3 minus/5 in the left biceps triceps and deltoid 4 minus at grip, 4 minus right hip flexor knee extensor ankle dorsiflexor plantar flexor  Decreased sensation in Right hand Speech reduced verbal output, able to follow basic commands Oriented to person, place and Month, off by one day  on date  Assessment/Plan: 1. Functional deficits secondary to Left CR, Lentiform nucleus, temporal infarct which require 3+ hours per day of interdisciplinary therapy in a comprehensive inpatient rehab setting. Physiatrist is providing close team supervision and 24 hour management of active medical problems listed below. Physiatrist and rehab team  continue to assess barriers to discharge/monitor patient progress toward functional and medical goals.  FIM: FIM - Bathing Bathing Steps Patient Completed: Chest;Right Arm;Abdomen;Front perineal area;Right upper leg;Left upper leg Bathing: 3: Mod-Patient completes 5-7 67f10 parts or 50-74%  FIM - Upper Body Dressing/Undressing Upper body dressing/undressing steps patient completed: Thread/unthread left sleeve of pullover shirt/dress;Put head through opening of pull over shirt/dress;Pull shirt over trunk Upper body dressing/undressing: 4: Min-Patient completed 75 plus % of tasks FIM - Lower Body Dressing/Undressing Lower body dressing/undressing steps patient completed: Thread/unthread right pants leg;Thread/unthread left pants leg Lower body dressing/undressing: 2: Max-Patient completed 25-49% of tasks  FIM - TMusicianDevices: Grab bar or rail for support Toileting: 1: Two helpers  FIM - TRadio producerDevices: Grab bars Toilet Transfers: 3-To toilet/BSC: Mod A (lift or lower assist);3-From toilet/BSC: Mod A (lift or lower assist)  FIM - Bed/Chair Transfer Bed/Chair Transfer Assistive Devices: Arm rests;Bed rails Bed/Chair Transfer: 4: Chair or W/C > Bed: Min A (steadying Pt. > 75%)  FIM - Locomotion: Wheelchair Distance: 150 Locomotion: Wheelchair: 5: Travels 150 ft or more: maneuvers on rugs and over door sills with supervision, cueing or coaxing FIM - Locomotion: Ambulation Locomotion: Ambulation Assistive Devices: Walker - Rolling;Orthosis;Other (comment) (R hand orthosis; R AFO ) Ambulation/Gait Assistance: 3: Mod assist Locomotion: Ambulation: 1: Travels less than 50 ft with moderate assistance (Pt: 50 - 74%)  Comprehension Comprehension Mode: Auditory Comprehension: 5-Understands complex 90% of the time/Cues < 10% of the time  Expression Expression Mode: Verbal Expression: 3-Expresses basic 50 - 74% of the time/requires  cueing 25 - 50% of the time. Needs to repeat parts of sentences.  Social Interaction Social Interaction: 5-Interacts appropriately 90% of the time - Needs monitoring or encouragement for participation or interaction.  Problem Solving Problem Solving: 3-Solves basic 50 - 74% of the time/requires cueing 25 - 49% of the time  Memory Memory: 4-Recognizes or recalls 75 - 89% of the time/requires cueing 10 - 24% of the time   Medical Problem List and Plan:  1. Functional deficits secondary to embolic infarcts left corona radiata, left lentiform nucleus, left temporal lobe with R Hemiparesis and aphasia  2. DVT Prophylaxis/Anticoagulation: Pharmaceutical: Lovenox  3. Pain Management: Tylenol 650 mg every 4 hours when necessary  4. Mood: Monitor for post stroke depression , flat affect, trial celexa 5. Neuropsych: This patient is not capable of making decisions on his own behalf.  6. HTN: 7. DM type 2: uncontrolled Glucophage d/ced secondary to elevated creat, started amaryl--still elevated, increase to 420mqd 8. Seizure disorder:  9.  Acute renal insufficiency, suspect ACE-I, HCTZ +/- reduced fluid intake, will reduce lisinopril and HCTZ, enc po, monitor BMET--(improved yesterday)    LOS (Days) 8 A FACE TO FACE EVALUATION WAS PERFORMED  SWARTZ,ZACHARY T 12/25/2013, 7:47 AM

## 2013-12-26 ENCOUNTER — Encounter (HOSPITAL_COMMUNITY): Payer: Medicare Other | Admitting: *Deleted

## 2013-12-26 DIAGNOSIS — G8110 Spastic hemiplegia affecting unspecified side: Secondary | ICD-10-CM

## 2013-12-26 DIAGNOSIS — I6330 Cerebral infarction due to thrombosis of unspecified cerebral artery: Secondary | ICD-10-CM

## 2013-12-26 LAB — GLUCOSE, CAPILLARY
GLUCOSE-CAPILLARY: 140 mg/dL — AB (ref 70–99)
GLUCOSE-CAPILLARY: 275 mg/dL — AB (ref 70–99)
GLUCOSE-CAPILLARY: 65 mg/dL — AB (ref 70–99)
Glucose-Capillary: 95 mg/dL (ref 70–99)
Glucose-Capillary: 196 mg/dL — ABNORMAL HIGH (ref 70–99)

## 2013-12-26 NOTE — Progress Notes (Signed)
Subjective/Complaints: 43 year old male with history of DM type 2, seizure disorder, HTN, prior CVA who was admitted to [facility] on 12/14/13 with weakness X 2 days and difficulty talking. Patient had been out of BP medications X 2 1/2 months. CT head with stable hydrocephalus. MRI brain done revealing acute infarct left corona radiata, lentiform nuclei, left posterior temporal lobe and chronic severe ventriculomegaly/hydrocephalus  Denies any problems. Slept well. No specific complaints, brother concurs Review of Systems - limited due to language  Objective: Vital Signs: Blood pressure 117/78, pulse 65, temperature 97.9 F (36.6 C), temperature source Oral, resp. rate 18, SpO2 97.00%. No results found. Results for orders placed during the hospital encounter of 12/17/13 (from the past 72 hour(s))  GLUCOSE, CAPILLARY     Status: Abnormal   Collection Time    12/23/13 11:31 AM      Result Value Ref Range   Glucose-Capillary 251 (*) 70 - 99 mg/dL   Comment 1 Notify RN    GLUCOSE, CAPILLARY     Status: Abnormal   Collection Time    12/23/13  4:25 PM      Result Value Ref Range   Glucose-Capillary 175 (*) 70 - 99 mg/dL  GLUCOSE, CAPILLARY     Status: Abnormal   Collection Time    12/23/13  8:50 PM      Result Value Ref Range   Glucose-Capillary 153 (*) 70 - 99 mg/dL  BASIC METABOLIC PANEL     Status: Abnormal   Collection Time    12/24/13  6:19 AM      Result Value Ref Range   Sodium 136 (*) 137 - 147 mEq/L   Potassium 4.6  3.7 - 5.3 mEq/L   Chloride 98  96 - 112 mEq/L   CO2 25  19 - 32 mEq/L   Glucose, Bld 158 (*) 70 - 99 mg/dL   BUN 21  6 - 23 mg/dL   Creatinine, Ser 1.23  0.50 - 1.35 mg/dL   Calcium 9.5  8.4 - 10.5 mg/dL   GFR calc non Af Amer 71 (*) >90 mL/min   GFR calc Af Amer 83 (*) >90 mL/min   Comment: (NOTE)     The eGFR has been calculated using the CKD EPI equation.     This calculation has not been validated in all clinical situations.     eGFR's persistently <90  mL/min signify possible Chronic Kidney     Disease.  GLUCOSE, CAPILLARY     Status: Abnormal   Collection Time    12/24/13  7:10 AM      Result Value Ref Range   Glucose-Capillary 162 (*) 70 - 99 mg/dL   Comment 1 Notify RN    GLUCOSE, CAPILLARY     Status: Abnormal   Collection Time    12/24/13 11:35 AM      Result Value Ref Range   Glucose-Capillary 285 (*) 70 - 99 mg/dL   Comment 1 Notify RN    GLUCOSE, CAPILLARY     Status: Abnormal   Collection Time    12/24/13  4:31 PM      Result Value Ref Range   Glucose-Capillary 204 (*) 70 - 99 mg/dL  GLUCOSE, CAPILLARY     Status: Abnormal   Collection Time    12/24/13  8:59 PM      Result Value Ref Range   Glucose-Capillary 213 (*) 70 - 99 mg/dL  GLUCOSE, CAPILLARY     Status: Abnormal   Collection  Time    12/25/13  7:08 AM      Result Value Ref Range   Glucose-Capillary 145 (*) 70 - 99 mg/dL   Comment 1 Notify RN    GLUCOSE, CAPILLARY     Status: Abnormal   Collection Time    12/25/13 11:11 AM      Result Value Ref Range   Glucose-Capillary 170 (*) 70 - 99 mg/dL   Comment 1 Notify RN    GLUCOSE, CAPILLARY     Status: Abnormal   Collection Time    12/25/13  4:26 PM      Result Value Ref Range   Glucose-Capillary 157 (*) 70 - 99 mg/dL   Comment 1 Notify RN    GLUCOSE, CAPILLARY     Status: Abnormal   Collection Time    12/25/13  8:55 PM      Result Value Ref Range   Glucose-Capillary 201 (*) 70 - 99 mg/dL   Comment 1 Notify RN        Mood and affect are appropriate  Heart: Regular rate and rhythm no rubs murmurs or extra sounds  Lungs: Clear to auscultation, breathing unlabored, no rales or wheezes  Abdomen: Positive bowel sounds, soft nontender to palpation, nondistended  Extremities: No clubbing, cyanosis, or edema  Skin: No evidence of breakdown except small area on L forearm, no evidence of rash  Neurologic: Cranial nerves II through XII intact, motor strength is 5/5 in Left deltoid, bicep, tricep, grip, hip  flexor, knee extensors, ankle dorsiflexor and plantar flexor  3 minus/5 in the left biceps triceps and deltoid 4 minus at grip, 4 minus right hip flexor knee extensor ankle dorsiflexor plantar flexor  Decreased sensation in Right hand Speech reduced verbal output, able to follow basic commands Oriented to person, place and Month, off by one day on date  Assessment/Plan: 1. Functional deficits secondary to Left CR, Lentiform nucleus, temporal infarct which require 3+ hours per day of interdisciplinary therapy in a comprehensive inpatient rehab setting. Physiatrist is providing close team supervision and 24 hour management of active medical problems listed below. Physiatrist and rehab team continue to assess barriers to discharge/monitor patient progress toward functional and medical goals.  FIM: FIM - Bathing Bathing Steps Patient Completed: Chest;Right Arm;Abdomen;Front perineal area;Right upper leg;Left upper leg Bathing: 3: Mod-Patient completes 5-7 39f10 parts or 50-74%  FIM - Upper Body Dressing/Undressing Upper body dressing/undressing steps patient completed: Thread/unthread left sleeve of pullover shirt/dress;Put head through opening of pull over shirt/dress;Pull shirt over trunk Upper body dressing/undressing: 4: Min-Patient completed 75 plus % of tasks FIM - Lower Body Dressing/Undressing Lower body dressing/undressing steps patient completed: Thread/unthread right pants leg;Thread/unthread left pants leg Lower body dressing/undressing: 2: Max-Patient completed 25-49% of tasks  FIM - TMusicianDevices: Grab bar or rail for support Toileting: 1: Two helpers  FIM - TRadio producerDevices: Grab bars Toilet Transfers: 3-To toilet/BSC: Mod A (lift or lower assist);3-From toilet/BSC: Mod A (lift or lower assist)  FIM - Bed/Chair Transfer Bed/Chair Transfer Assistive Devices: Arm rests;Bed rails Bed/Chair Transfer: 4: Chair or W/C >  Bed: Min A (steadying Pt. > 75%)  FIM - Locomotion: Wheelchair Distance: 150 Locomotion: Wheelchair: 5: Travels 150 ft or more: maneuvers on rugs and over door sills with supervision, cueing or coaxing FIM - Locomotion: Ambulation Locomotion: Ambulation Assistive Devices: Walker - Rolling;Orthosis;Other (comment) (R hand orthosis; R AFO ) Ambulation/Gait Assistance: 3: Mod assist Locomotion: Ambulation: 1: Travels less than 50  ft with moderate assistance (Pt: 50 - 74%)  Comprehension Comprehension Mode: Auditory Comprehension: 5-Understands complex 90% of the time/Cues < 10% of the time  Expression Expression Mode: Verbal Expression: 3-Expresses basic 50 - 74% of the time/requires cueing 25 - 50% of the time. Needs to repeat parts of sentences.  Social Interaction Social Interaction: 5-Interacts appropriately 90% of the time - Needs monitoring or encouragement for participation or interaction.  Problem Solving Problem Solving: 3-Solves basic 50 - 74% of the time/requires cueing 25 - 49% of the time  Memory Memory: 4-Recognizes or recalls 75 - 89% of the time/requires cueing 10 - 24% of the time   Medical Problem List and Plan:  1. Functional deficits secondary to embolic infarcts left corona radiata, left lentiform nucleus, left temporal lobe with R Hemiparesis and aphasia  2. DVT Prophylaxis/Anticoagulation: Pharmaceutical: Lovenox  3. Pain Management: Tylenol 650 mg every 4 hours when necessary  4. Mood: Monitor for post stroke depression , flat affect, trial celexa 5. Neuropsych: This patient is not capable of making decisions on his own behalf.  6. HTN:bp normotensive 7. DM type 2: uncontrolled Glucophage d/ced secondary to elevated creat,    -due to persistently elevated CBG's I increased amaryl to 37m qd on 6/20 8. Seizure disorder:  9.  Acute renal insufficiency, suspect ACE-I, HCTZ +/- reduced fluid intake, reduced lisinopril and HCTZ, enc po, monitor BMET--(improved )     LOS (Days) 9 A FACE TO FACE EVALUATION WAS PERFORMED  SWARTZ,[name] 12/26/2013, 7:49 AM

## 2013-12-26 NOTE — Significant Event (Signed)
Hypoglycemic Event  CBG: 65 Treatment: 15 GM carbohydrate snack  Symptoms: None  Follow-up CBG: Time:1722 CBG Result: 95  Possible Reasons for Event: Unknown  Comments/MD notified:Dr Riley Kill notified. Metformin 1800 dose held     Royal, Sylvie Farrier  Remember to initiate Hypoglycemia Order Set & complete

## 2013-12-27 ENCOUNTER — Encounter (HOSPITAL_COMMUNITY): Payer: Medicare Other | Admitting: Occupational Therapy

## 2013-12-27 ENCOUNTER — Inpatient Hospital Stay (HOSPITAL_COMMUNITY): Payer: Medicare Other | Admitting: Physical Therapy

## 2013-12-27 ENCOUNTER — Inpatient Hospital Stay (HOSPITAL_COMMUNITY): Payer: Medicare Other | Admitting: Speech Pathology

## 2013-12-27 DIAGNOSIS — I6330 Cerebral infarction due to thrombosis of unspecified cerebral artery: Secondary | ICD-10-CM

## 2013-12-27 DIAGNOSIS — G8110 Spastic hemiplegia affecting unspecified side: Secondary | ICD-10-CM

## 2013-12-27 LAB — GLUCOSE, CAPILLARY
GLUCOSE-CAPILLARY: 183 mg/dL — AB (ref 70–99)
GLUCOSE-CAPILLARY: 114 mg/dL — AB (ref 70–99)
GLUCOSE-CAPILLARY: 133 mg/dL — AB (ref 70–99)
Glucose-Capillary: 130 mg/dL — ABNORMAL HIGH (ref 70–99)

## 2013-12-27 NOTE — Progress Notes (Signed)
Occupational Therapy Session Note  Patient Details  Name: [name] MRN: [id number] Date of Birth: [birth year]  Today's Date: 12/27/2013 Time: 0712-1975 Time Calculation (min): 45 min  Short Term Goals: Week 2:  OT Short Term Goal 1 (Week 2): Patient will transfer to toilet with min assist OT Short Term Goal 2 (Week 2): Patient will dress upper body with min assist  OT Short Term Goal 3 (Week 2): Patient will dress lower body with min assist OT Short Term Goal 4 (Week 2): Patient will transfer to tub bench with mod assist OT Short Term Goal 5 (Week 2): Patient will bathe self with min assist  Skilled Therapeutic Interventions/Progress Updates:    Patient seen this am for OT intervention to address postural control, attention to right side, functional use of right extremities, and selective attention, initiation.  Patient standing with decreased support today, with verbal and physical cues to activate right leg.  Patient now with AFO on right leg which appears to improve standing and stand step transfer.    Therapy Documentation Precautions:  Precautions Precautions: Fall Restrictions Weight Bearing Restrictions: No Pain: Denies pain  Therapy/Group: Individual Therapy  Collier Salina 12/27/2013, 12:37 PM

## 2013-12-27 NOTE — Progress Notes (Signed)
Subjective/Complaints: 43 year old male with history of DM type 2, seizure disorder, HTN, prior CVA who was admitted to [facility] on 12/14/13 with weakness X 2 days and difficulty talking. Patient had been out of BP medications X 2 1/2 months. CT head with stable hydrocephalus. MRI brain done revealing acute infarct left corona radiata, lentiform nuclei, left posterior temporal lobe and chronic severe ventriculomegaly/hydrocephalus  Aphasic,trying to explain what d/c living situation wil be but not able to express more than basic information  Review of Systems - unable to obtain secondary to aphasia  Objective: Vital Signs: Blood pressure 136/90, pulse 61, temperature 97.9 F (36.6 C), temperature source Oral, resp. rate 18, SpO2 98.00%. No results found. Results for orders placed during the hospital encounter of 12/17/13 (from the past 72 hour(s))  GLUCOSE, CAPILLARY     Status: Abnormal   Collection Time    12/24/13  7:10 AM      Result Value Ref Range   Glucose-Capillary 162 (*) 70 - 99 mg/dL   Comment 1 Notify RN    GLUCOSE, CAPILLARY     Status: Abnormal   Collection Time    12/24/13 11:35 AM      Result Value Ref Range   Glucose-Capillary 285 (*) 70 - 99 mg/dL   Comment 1 Notify RN    GLUCOSE, CAPILLARY     Status: Abnormal   Collection Time    12/24/13  4:31 PM      Result Value Ref Range   Glucose-Capillary 204 (*) 70 - 99 mg/dL  GLUCOSE, CAPILLARY     Status: Abnormal   Collection Time    12/24/13  8:59 PM      Result Value Ref Range   Glucose-Capillary 213 (*) 70 - 99 mg/dL  GLUCOSE, CAPILLARY     Status: Abnormal   Collection Time    12/25/13  7:08 AM      Result Value Ref Range   Glucose-Capillary 145 (*) 70 - 99 mg/dL   Comment 1 Notify RN    GLUCOSE, CAPILLARY     Status: Abnormal   Collection Time    12/25/13 11:11 AM      Result Value Ref Range   Glucose-Capillary 170 (*) 70 - 99 mg/dL   Comment 1 Notify RN    GLUCOSE, CAPILLARY     Status: Abnormal    Collection Time    12/25/13  4:26 PM      Result Value Ref Range   Glucose-Capillary 157 (*) 70 - 99 mg/dL   Comment 1 Notify RN    GLUCOSE, CAPILLARY     Status: Abnormal   Collection Time    12/25/13  8:55 PM      Result Value Ref Range   Glucose-Capillary 201 (*) 70 - 99 mg/dL   Comment 1 Notify RN    GLUCOSE, CAPILLARY     Status: Abnormal   Collection Time    12/26/13  7:51 AM      Result Value Ref Range   Glucose-Capillary 140 (*) 70 - 99 mg/dL   Comment 1 Notify RN     Comment 2 Documented in Chart    GLUCOSE, CAPILLARY     Status: Abnormal   Collection Time    12/26/13 11:02 AM      Result Value Ref Range   Glucose-Capillary 275 (*) 70 - 99 mg/dL   Comment 1 Notify RN     Comment 2 Documented in Chart    GLUCOSE, CAPILLARY  Status: Abnormal   Collection Time    12/26/13  4:42 PM      Result Value Ref Range   Glucose-Capillary 65 (*) 70 - 99 mg/dL   Comment 1 Documented in Chart    GLUCOSE, CAPILLARY     Status: None   Collection Time    12/26/13  5:22 PM      Result Value Ref Range   Glucose-Capillary 95  70 - 99 mg/dL   Comment 1 Documented in Chart    GLUCOSE, CAPILLARY     Status: Abnormal   Collection Time    12/26/13  9:02 PM      Result Value Ref Range   Glucose-Capillary 196 (*) 70 - 99 mg/dL   Comment 1 Notify RN        Mood and affect are appropriate  Heart: Regular rate and rhythm no rubs murmurs or extra sounds  Lungs: Clear to auscultation, breathing unlabored, no rales or wheezes  Abdomen: Positive bowel sounds, soft nontender to palpation, nondistended  Extremities: No clubbing, cyanosis, or edema  Skin: No evidence of breakdown except small area on L forearm, no evidence of rash  Neurologic: Cranial nerves II through XII intact, motor strength is 5/5 in Left deltoid, bicep, tricep, grip, hip flexor, knee extensors, ankle dorsiflexor and plantar flexor  3 minus/5 in the left biceps triceps and deltoid 4 minus at grip, 4 minus right hip  flexor knee extensor ankle dorsiflexor plantar flexor  Decreased sensation in Right hand Speech reduced verbal output, able to follow basic commands Oriented to person, place and Month, off by one day on date  Assessment/Plan: 1. Functional deficits secondary to Left CR, Lentiform nucleus, temporal infarct which require 3+ hours per day of interdisciplinary therapy in a comprehensive inpatient rehab setting. Physiatrist is providing close team supervision and 24 hour management of active medical problems listed below. Physiatrist and rehab team continue to assess barriers to discharge/monitor patient progress toward functional and medical goals.  FIM: FIM - Bathing Bathing Steps Patient Completed: Chest;Right Arm;Abdomen;Front perineal area;Right upper leg;Left upper leg Bathing: 3: Mod-Patient completes 5-7 34f 10 parts or 50-74%  FIM - Upper Body Dressing/Undressing Upper body dressing/undressing steps patient completed: Thread/unthread left sleeve of pullover shirt/dress;Put head through opening of pull over shirt/dress;Pull shirt over trunk Upper body dressing/undressing: 4: Min-Patient completed 75 plus % of tasks FIM - Lower Body Dressing/Undressing Lower body dressing/undressing steps patient completed: Thread/unthread right pants leg;Thread/unthread left pants leg Lower body dressing/undressing: 2: Max-Patient completed 25-49% of tasks  FIM - Hotel manager Devices: Grab bar or rail for support Toileting: 1: Two helpers  FIM - Diplomatic Services operational officer Devices: Grab bars Toilet Transfers: 3-To toilet/BSC: Mod A (lift or lower assist);3-From toilet/BSC: Mod A (lift or lower assist)  FIM - Bed/Chair Transfer Bed/Chair Transfer Assistive Devices: Arm rests;Bed rails Bed/Chair Transfer: 4: Chair or W/C > Bed: Min A (steadying Pt. > 75%)  FIM - Locomotion: Wheelchair Distance: 150 Locomotion: Wheelchair: 5: Travels 150 ft or more: maneuvers on  rugs and over door sills with supervision, cueing or coaxing FIM - Locomotion: Ambulation Locomotion: Ambulation Assistive Devices: Walker - Rolling;Orthosis;Other (comment) (R hand orthosis; R AFO ) Ambulation/Gait Assistance: 3: Mod assist Locomotion: Ambulation: 1: Travels less than 50 ft with moderate assistance (Pt: 50 - 74%)  Comprehension Comprehension Mode: Auditory Comprehension: 5-Understands complex 90% of the time/Cues < 10% of the time  Expression Expression Mode: Verbal Expression: 3-Expresses basic 50 - 74% of the  time/requires cueing 25 - 50% of the time. Needs to repeat parts of sentences.  Social Interaction Social Interaction: 5-Interacts appropriately 90% of the time - Needs monitoring or encouragement for participation or interaction.  Problem Solving Problem Solving: 3-Solves basic 50 - 74% of the time/requires cueing 25 - 49% of the time  Memory Memory: 4-Recognizes or recalls 75 - 89% of the time/requires cueing 10 - 24% of the time   Medical Problem List and Plan:  1. Functional deficits secondary to embolic infarcts left corona radiata, left lentiform nucleus, left temporal lobe with R Hemiparesis and aphasia  2. DVT Prophylaxis/Anticoagulation: Pharmaceutical: Lovenox  3. Pain Management: Tylenol 650 mg every 4 hours when necessary  4. Mood: Monitor for post stroke depression , flat affect, trial celexa 5. Neuropsych: This patient is not capable of making decisions on his own behalf.  6. HTN: 7. DM type 2: uncontrolled Glucophage d/ced secondary to elevated creat, started amaryl dose increased 6/20, monitor for hypo glycemia 8. Seizure disorder:  9.  Acute renal insufficiency, suspect ACE-I, HCTZ +/- reduced fluid intake, will reduce lisinopril and HCTZ, enc po, monitor BMET , recheck in am LOS (Days) 10 A FACE TO FACE EVALUATION WAS PERFORMED  KIRSTEINS,ANDREW E 12/27/2013, 6:36 AM

## 2013-12-27 NOTE — Progress Notes (Signed)
Speech Language Pathology Daily Session Note  Patient Details  Name: [name] MRN: [id number] Date of Birth: [birth year]  Today's Date: 12/27/2013 Time: 1116-1200 Time Calculation (min): 44 min  Short Term Goals: Week 2: SLP Short Term Goal 1 (Week 2): Pt will utilize swallowing compensatory strategies with Mod I to minimize overt s/s of aspiration.  SLP Short Term Goal 2 (Week 2): Pt will demonstrate effective mastication of regular textures with Mod I without overt s/s of aspiration.  SLP Short Term Goal 3 (Week 2): Pt will attend to the right field of environment during functional tasks with supervision cues.  SLP Short Term Goal 4 (Week 2): Pt will demonstrate functional problem solving for basic and familiar tasks with supervision mulimodal cues.  SLP Short Term Goal 5 (Week 2): Pt will self-monitor and correct verbal errors with min  A multimodal cues.   Skilled Therapeutic Interventions:  Pt was seen for skilled speech therapy targeting verbal expression.  SLP facilitated session with a structured generative naming task targeting improved functional communication.  Pt named object by function with 50% accuracy independently, improving to 70% accuracy with min assist, and 90% accuracy with mod assist. Pt presented with both semantic and phonemic paraphasic errors and exhibited good error awareness and attempts to self correct.  Improvements were most notable with semantic and question cues from the SLP and pt independently initiated use of gestures x1 to augment verbal communication.  SLP engaged pt in functional conversation related to discharge using components of supported conversation for aphasics (allow extra time for communication, rephrase pt's utterances to verify intent, and using a multimodal method of communication) to maximize pt's functional independence for communicating semi-complex needs/wants.  Continue per current plan of care.  FIM:  Comprehension Comprehension Mode:  Auditory Comprehension: 4-Understands basic 75 - 89% of the time/requires cueing 10 - 24% of the time Expression Expression Mode: Verbal Expression: 3-Expresses basic 50 - 74% of the time/requires cueing 25 - 50% of the time. Needs to repeat parts of sentences. Social Interaction Social Interaction: 4-Interacts appropriately 75 - 89% of the time - Needs redirection for appropriate language or to initiate interaction. Problem Solving Problem Solving: 3-Solves basic 50 - 74% of the time/requires cueing 25 - 49% of the time Memory Memory: 3-Recognizes or recalls 50 - 74% of the time/requires cueing 25 - 49% of the time FIM - Eating Eating Activity: 5: Set-up assist for open containers;5: Supervision/cues  Pain Pain Assessment Pain Assessment: No/denies pain  Therapy/Group: Individual Therapy  Jackalyn Lombard, M.A. CCC-SLP  [name], [name] 12/27/2013, 4:04 PM

## 2013-12-27 NOTE — Progress Notes (Signed)
Recreational Therapy Session Note  Patient Details  Name: DAIN JOSH MRN: [id number] Date of Birth: 09-12-1970 Today's Date: 12/27/2013  Pain:  No c/o Skilled Therapeutic Interventions/Progress Updates: Session focused on activity tolerance, dynamic sitting balance, RUE use during tabletop activities.  Activities included manipulating pegs on game board, rolling & catching a ball using RUE, ball toss using BUE's while naming items in a category.  Pt required mod cues for naming. SIMPSON,LISA 12/27/2013, 3:34 PM

## 2013-12-27 NOTE — Progress Notes (Signed)
Occupational Therapy Session Note  Patient Details  Name: [name] MRN: [id number] Date of Birth: Sep 05, 1970  Today's Date: 12/27/2013  Short Term Goals: Week 1:  OT Short Term Goal 1 (Week 1): Pt will complete toilet transfer at mod assist OT Short Term Goal 1 - Progress (Week 1): Met OT Short Term Goal 2 (Week 1): Pt will complete 1 grooming task in standing with mod assist OT Short Term Goal 2 - Progress (Week 1): Met OT Short Term Goal 3 (Week 1): Pt will initiate use of RUE during self-care tasks 25% of time OT Short Term Goal 3 - Progress (Week 1): Not met OT Short Term Goal 4 (Week 1): Pt will complete LB dressing with max assist OT Short Term Goal 4 - Progress (Week 1): Met  Week 2:  OT Short Term Goal 1 (Week 2): Patient will transfer to toilet with min assist OT Short Term Goal 2 (Week 2): Patient will dress upper body with min assist  OT Short Term Goal 3 (Week 2): Patient will dress lower body with min assist OT Short Term Goal 4 (Week 2): Patient will transfer to tub bench with mod assist OT Short Term Goal 5 (Week 2): Patient will bathe self with min assist  Skilled Therapeutic Interventions/Progress Updates:  1330-1430 - 60 Minutes Group Therapy with TR present No complaints of pain Patient engaged in neuro re-education and therapeutic activity focusing on functional use of RUE, RUE strengthening, fine motor control/coordination, gross motor control/coordination, grasp/release, dynamic sitting balance/tolerance/endurance, automatic counting tasks (secondary to aphasia), simple conversation with other group members, and overall activity tolerance/endurance. 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 Patient participated in socialization with group appropriately. TR assisted patient back to room at end  of session.  Precautions:  Precautions Precautions: Fall Restrictions Weight Bearing Restrictions: No  See FIM for current functional status  Therapy/Group: Group Therapy  CLAY,PATRICIA 12/27/2013, 7:26 AM

## 2013-12-27 NOTE — Progress Notes (Signed)
Physical Therapy Session Note  Patient Details  Name: [name] MRN: [id number] Date of Birth: [birth year]  Today's Date: 12/27/2013 Time: 3578-9784 Time Calculation (min): 45 min  Short Term Goals: Week 1:  PT Short Term Goal 1 (Week 1): Pt will perform bed mobility with supervision. PT Short Term Goal 2 (Week 1): Pt will perform bed<>w/c transfers with mod A. PT Short Term Goal 3 (Week 1): Pt will maintain static standing balance x 1 min with min A.  PT Short Term Goal 4 (Week 1): Pt will perform gait x 25 ft using LRAD with mod A x 1.   Skilled Therapeutic Interventions/Progress Updates:    Pt received semi-reclined in bed; agreeable to therapy. Session focused on functional transfers and gait. Pt performed supine<>sit with supervision with HOb flat using bed rail. Upon sitting EOB, pt noted to be soiled secondary to bowel/bladder incontinence. Nurse tech notified. Pt performed self-hygiene with setup. Donned clean brief via bilat rolling with supervision and bed rail to roll to L side, min A to roll to R side. Performed multiple stand pivot transfers with min-mod A. Performed w/c mobility x150' in controlled environment with bilat LE's and superviison. Performed gait x20' in controlled environment with rolling walker, R hand orthosis, R AFO ([place]) and mod A for stability, and w/c follow for safety. Manual facilitation provided for weight shift to L; tactile cueing at R hip flexors and manual facilitation of forward R pelvic rotation to initiate R step length. Verbal cueing addressed R hip adduction during RLE advancement with effective within-session carryover. Gait trial ended secondary to noted bilat genu recurvatum of respective stance leg. Returned to room, where pt performed squat pivot transfer (to R side) from w/c>recliner with mod A to control movement/descent into recliner. Therapist departed with pt seated in recliner with quick release belt on for safety and all needs within  reach.  Therapy Documentation Precautions:  Precautions Precautions: Fall Restrictions Weight Bearing Restrictions: No Pain: Pain Assessment Pain Assessment: No/denies pain Locomotion : Ambulation Ambulation/Gait Assistance: 3: Mod assist Wheelchair Mobility Distance: 150   See FIM for current functional status  Therapy/Group: Individual Therapy  Hobble, Lorenda Ishihara 12/27/2013, 12:54 PM

## 2013-12-27 NOTE — Progress Notes (Signed)
Inpatient Diabetes Program Recommendations  AACE/ADA: New Consensus Statement on Inpatient Glycemic Control (2013)  Target Ranges:  Prepandial:   less than 140 mg/dL      Peak postprandial:   less than 180 mg/dL (1-2 hours)      Critically ill patients:  140 - 180 mg/dL   Reason for Assessment: Some hypoglycemia  Results for [name], [name] (MRN [id number]) as of 12/27/2013 09:59  Ref. Range 12/26/2013 07:51 12/26/2013 11:02 12/26/2013 16:42 12/26/2013 17:22 12/26/2013 21:02 12/27/2013 07:26  Glucose-Capillary Latest Range: 70-99 mg/dL 569 (H) 794 (H) 65 (L) 95 196 (H) 130 (H)   Note: Request MD consider decreasing correction from moderate to sensitive scale.   Thank you.  Kiran Carline S. Elsie Lincoln, RN, CNS, CDE Inpatient Diabetes Program, team pager [phone]

## 2013-12-28 ENCOUNTER — Inpatient Hospital Stay (HOSPITAL_COMMUNITY): Payer: Medicare Other | Admitting: Occupational Therapy

## 2013-12-28 ENCOUNTER — Encounter (HOSPITAL_COMMUNITY): Payer: Medicare Other | Admitting: Occupational Therapy

## 2013-12-28 ENCOUNTER — Inpatient Hospital Stay (HOSPITAL_COMMUNITY): Payer: Medicare Other | Admitting: Physical Therapy

## 2013-12-28 ENCOUNTER — Inpatient Hospital Stay (HOSPITAL_COMMUNITY): Payer: Medicare Other | Admitting: Speech Pathology

## 2013-12-28 DIAGNOSIS — I6330 Cerebral infarction due to thrombosis of unspecified cerebral artery: Secondary | ICD-10-CM

## 2013-12-28 DIAGNOSIS — G8110 Spastic hemiplegia affecting unspecified side: Secondary | ICD-10-CM

## 2013-12-28 LAB — GLUCOSE, CAPILLARY
Glucose-Capillary: 125 mg/dL — ABNORMAL HIGH (ref 70–99)
Glucose-Capillary: 146 mg/dL — ABNORMAL HIGH (ref 70–99)
Glucose-Capillary: 194 mg/dL — ABNORMAL HIGH (ref 70–99)
Glucose-Capillary: 151 mg/dL — ABNORMAL HIGH (ref 70–99)

## 2013-12-28 MED ORDER — BISACODYL 10 MG RE SUPP
10.0000 mg | Freq: Every day | RECTAL | Status: DC
  Administered 2013-12-29 – 2014-01-06 (×9): 10 mg via RECTAL
  Filled 2013-12-28 (×10): qty 1

## 2013-12-28 NOTE — Progress Notes (Signed)
Occupational Therapy Session Note  Patient Details  Name: RYLO FICKER MRN: [id number] Date of Birth: Dec 12, 1970  Today's Date: 12/28/2013 Time: 1300-1400 Time Calculation (min): 60 min  Short Term Goals: Week 2:  OT Short Term Goal 1 (Week 2): Patient will transfer to toilet with min assist OT Short Term Goal 2 (Week 2): Patient will dress upper body with min assist  OT Short Term Goal 3 (Week 2): Patient will dress lower body with min assist OT Short Term Goal 4 (Week 2): Patient will transfer to tub bench with mod assist OT Short Term Goal 5 (Week 2): Patient will bathe self with min assist  Skilled Therapeutic Interventions/Progress Updates:    Patient seen this pm for OT/TR intervention to address attention to right upper extremity.  Patient has active movement at each joint in right arm, but lacks volitional control of movement.  In situations where automatic movement is required, patient often spontaneously moves his arm.  Also in forced use conditions, patient can incorporate right upper extremity.  Patient unable to effectively reach toward targets with right upper extremity without use of left UE to passively move arm.    Therapy Documentation Precautions:  Precautions Precautions: Fall Restrictions Weight Bearing Restrictions: No   Pain Assessment Pain Assessment: No/denies pain  See FIM for current functional status  Therapy/Group: Group Therapy  Collier Salina 12/28/2013, 3:55 PM

## 2013-12-28 NOTE — Progress Notes (Signed)
Recreational Therapy Session Note  Patient Details  Name: [name] MRN: [id number] Date of Birth: 1971-03-17 Today's Date: 12/28/2013  Pain: no c/o Skilled Therapeutic Interventions/Progress Updates: Session focused on activity tolerance, RUE use, selective attention, & verbalization during tabletop activities.  Pt with some automatic use of of RUE but unable to intentionally use it. SIMPSON,LISA 12/28/2013, 3:54 PM

## 2013-12-28 NOTE — Progress Notes (Signed)
Occupational Therapy Session Note  Patient Details  Name: EZREAL MARSHBURN MRN: [id number] Date of Birth: 03/25/1971  Today's Date: 12/28/2013 Time: 7096-4383 Time Calculation (min): 45 min  Short Term Goals: Week 2:  OT Short Term Goal 1 (Week 2): Patient will transfer to toilet with min assist OT Short Term Goal 2 (Week 2): Patient will dress upper body with min assist  OT Short Term Goal 3 (Week 2): Patient will dress lower body with min assist OT Short Term Goal 4 (Week 2): Patient will transfer to tub bench with mod assist OT Short Term Goal 5 (Week 2): Patient will bathe self with min assist  Skilled Therapeutic Interventions/Progress Updates:    Patient seen this am for OT intervention to address right lower extremity control in standing activities, and improve problem solving and initiation during basic self care skills.  Patient reports being incontinent of urine, and having little warning.  Patient may benefit from timed toileting program - will discuss with nursing.  Patient lacks initiation to call for assistance, and may benefit from a structured process to improve continence.  Patient agreeable to shower and demonstrated improved transfer into shower. Improved sequencing of tasks noted during shower versus sponge bath condition.     Therapy Documentation Precautions:  Precautions Precautions: Fall Restrictions Weight Bearing Restrictions: No  Pain: Pain Assessment Pain Assessment: No/denies pain Pain Score: 0-No pain  See FIM for current functional status  Therapy/Group: Individual Therapy  Collier Salina 12/28/2013, 9:52 AM

## 2013-12-28 NOTE — Progress Notes (Signed)
Subjective/Complaints: 43 year old male with history of DM type 2, seizure disorder, HTN, prior CVA who was admitted to [facility] on 12/14/13 with weakness X 2 days and difficulty talking. Patient had been out of BP medications X 2 1/2 months. CT head with stable hydrocephalus. MRI brain done revealing acute infarct left corona radiata, lentiform nuclei, left posterior temporal lobe and chronic severe ventriculomegaly/hydrocephalus  Aphasic,trying to explain bowel problem, c/o having BMs in bed in evenings  Review of Systems - unable to obtain secondary to aphasia  Objective: Vital Signs: Blood pressure 163/97, pulse 67, temperature 98.5 F (36.9 C), temperature source Oral, resp. rate 18, SpO2 98.00%. No results found. Results for orders placed during the hospital encounter of 12/17/13 (from the past 72 hour(s))  GLUCOSE, CAPILLARY     Status: Abnormal   Collection Time    12/25/13  7:08 AM      Result Value Ref Range   Glucose-Capillary 145 (*) 70 - 99 mg/dL   Comment 1 Notify RN    GLUCOSE, CAPILLARY     Status: Abnormal   Collection Time    12/25/13 11:11 AM      Result Value Ref Range   Glucose-Capillary 170 (*) 70 - 99 mg/dL   Comment 1 Notify RN    GLUCOSE, CAPILLARY     Status: Abnormal   Collection Time    12/25/13  4:26 PM      Result Value Ref Range   Glucose-Capillary 157 (*) 70 - 99 mg/dL   Comment 1 Notify RN    GLUCOSE, CAPILLARY     Status: Abnormal   Collection Time    12/25/13  8:55 PM      Result Value Ref Range   Glucose-Capillary 201 (*) 70 - 99 mg/dL   Comment 1 Notify RN    GLUCOSE, CAPILLARY     Status: Abnormal   Collection Time    12/26/13  7:51 AM      Result Value Ref Range   Glucose-Capillary 140 (*) 70 - 99 mg/dL   Comment 1 Notify RN     Comment 2 Documented in Chart    GLUCOSE, CAPILLARY     Status: Abnormal   Collection Time    12/26/13 11:02 AM      Result Value Ref Range   Glucose-Capillary 275 (*) 70 - 99 mg/dL   Comment 1 Notify RN      Comment 2 Documented in Chart    GLUCOSE, CAPILLARY     Status: Abnormal   Collection Time    12/26/13  4:42 PM      Result Value Ref Range   Glucose-Capillary 65 (*) 70 - 99 mg/dL   Comment 1 Documented in Chart    GLUCOSE, CAPILLARY     Status: None   Collection Time    12/26/13  5:22 PM      Result Value Ref Range   Glucose-Capillary 95  70 - 99 mg/dL   Comment 1 Documented in Chart    GLUCOSE, CAPILLARY     Status: Abnormal   Collection Time    12/26/13  9:02 PM      Result Value Ref Range   Glucose-Capillary 196 (*) 70 - 99 mg/dL   Comment 1 Notify RN    GLUCOSE, CAPILLARY     Status: Abnormal   Collection Time    12/27/13  7:26 AM      Result Value Ref Range   Glucose-Capillary 130 (*) 70 - 99  mg/dL   Comment 1 Notify RN    GLUCOSE, CAPILLARY     Status: Abnormal   Collection Time    12/27/13 11:19 AM      Result Value Ref Range   Glucose-Capillary 183 (*) 70 - 99 mg/dL   Comment 1 Notify RN    GLUCOSE, CAPILLARY     Status: Abnormal   Collection Time    12/27/13  4:27 PM      Result Value Ref Range   Glucose-Capillary 114 (*) 70 - 99 mg/dL  GLUCOSE, CAPILLARY     Status: Abnormal   Collection Time    12/27/13  9:37 PM      Result Value Ref Range   Glucose-Capillary 133 (*) 70 - 99 mg/dL      Mood and affect are appropriate  Heart: Regular rate and rhythm no rubs murmurs or extra sounds  Lungs: Clear to auscultation, breathing unlabored, no rales or wheezes  Abdomen: Positive bowel sounds, soft nontender to palpation, nondistended  Extremities: No clubbing, cyanosis, or edema  Skin: No evidence of breakdown except small area on L forearm, no evidence of rash  Neurologic: Cranial nerves II through XII intact, motor strength is 5/5 in Left deltoid, bicep, tricep, grip, hip flexor, knee extensors, ankle dorsiflexor and plantar flexor  3 minus/5 in the left biceps triceps and deltoid 4 minus at grip, 4 minus right hip flexor knee extensor ankle dorsiflexor  plantar flexor  Decreased sensation in Right hand Speech reduced verbal output, able to follow basic commands Oriented to person, place and Month, off by one day on date  Assessment/Plan: 1. Functional deficits secondary to Left CR, Lentiform nucleus, temporal infarct which require 3+ hours per day of interdisciplinary therapy in a comprehensive inpatient rehab setting. Physiatrist is providing close team supervision and 24 hour management of active medical problems listed below. Physiatrist and rehab team continue to assess barriers to discharge/monitor patient progress toward functional and medical goals.  FIM: FIM - Bathing Bathing Steps Patient Completed: Chest;Right Arm;Abdomen;Front perineal area;Right upper leg;Left upper leg;Buttocks;Right lower leg (including foot);Left lower leg (including foot) Bathing: 4: Min-Patient completes 8-9 1f 10 parts or 75+ percent  FIM - Upper Body Dressing/Undressing Upper body dressing/undressing steps patient completed: Thread/unthread left sleeve of pullover shirt/dress;Put head through opening of pull over shirt/dress;Pull shirt over trunk Upper body dressing/undressing: 4: Min-Patient completed 75 plus % of tasks FIM - Lower Body Dressing/Undressing Lower body dressing/undressing steps patient completed: Thread/unthread right pants leg;Thread/unthread left pants leg;Don/Doff left shoe Lower body dressing/undressing: 3: Mod-Patient completed 50-74% of tasks  FIM - Hotel manager Devices: Grab bar or rail for support Toileting: 0: Activity did not occur  FIM - Diplomatic Services operational officer Devices: Grab bars Toilet Transfers: 3-To toilet/BSC: Mod A (lift or lower assist);3-From toilet/BSC: Mod A (lift or lower assist)  FIM - Bed/Chair Transfer Bed/Chair Transfer Assistive Devices: Arm rests;Bed rails Bed/Chair Transfer: 5: Supine > Sit: Supervision (verbal cues/safety issues);3: Bed > Chair or W/C: Mod A (lift or  lower assist);3: Chair or W/C > Bed: Mod A (lift or lower assist);5: Sit > Supine: Supervision (verbal cues/safety issues)  FIM - Locomotion: Wheelchair Distance: 150 Locomotion: Wheelchair: 5: Travels 150 ft or more: maneuvers on rugs and over door sills with supervision, cueing or coaxing FIM - Locomotion: Ambulation Locomotion: Ambulation Assistive Devices: Walker - Rolling;Orthosis;Other (comment) (R AFO, R hand orthosis) Ambulation/Gait Assistance: 3: Mod assist Locomotion: Ambulation: 1: Travels less than 50 ft with moderate assistance (  Pt: 50 - 74%)  Comprehension Comprehension Mode: Auditory Comprehension: 4-Understands basic 75 - 89% of the time/requires cueing 10 - 24% of the time  Expression Expression Mode: Verbal Expression: 3-Expresses basic 50 - 74% of the time/requires cueing 25 - 50% of the time. Needs to repeat parts of sentences.  Social Interaction Social Interaction: 4-Interacts appropriately 75 - 89% of the time - Needs redirection for appropriate language or to initiate interaction.  Problem Solving Problem Solving: 3-Solves basic 50 - 74% of the time/requires cueing 25 - 49% of the time  Memory Memory: 3-Recognizes or recalls 50 - 74% of the time/requires cueing 25 - 49% of the time   Medical Problem List and Plan:  1. Functional deficits secondary to embolic infarcts left corona radiata, left lentiform nucleus, left temporal lobe with R Hemiparesis and aphasia  2. DVT Prophylaxis/Anticoagulation: Pharmaceutical: Lovenox  3. Pain Management: Tylenol 650 mg every 4 hours when necessary  4. Mood: Monitor for post stroke depression , flat affect, trial celexa 5. Neuropsych: This patient is not capable of making decisions on his own behalf.  6. HTN: 7. DM type 2: uncontrolled Glucophage d/ced secondary to elevated creat, started amaryl dose increased 6/20, monitor for hypo glycemia 8. Seizure disorder:  9.  Acute renal insufficiency, suspect ACE-I, HCTZ +/-  reduced fluid intake, will reduce lisinopril and HCTZ, enc po, monitor BMET , recheck in am 10.  Bowel inc, start bowel program, senna qhs , dulc qam LOS (Days) 11 A FACE TO FACE EVALUATION WAS PERFORMED  KIRSTEINS,ANDREW E 12/28/2013, 6:13 AM

## 2013-12-28 NOTE — Progress Notes (Signed)
Physical Therapy Weekly Progress Note  Patient Details  Name: [name] MRN: [id number] Date of Birth: [birth year]  Beginning of progress report period: December 19, 2013 End of progress report period: December 28, 2013  Today's Date: 12/28/2013 Time: 8469-6295 Time Calculation (min): 45 min  Patient has met 4 of 4 short term goals.  Pt progress with bed mobility, basic transfers, standing balance, and gait is secondary to improved midline orientation, postural stability, coordination, timing and sequencing, and improved attention and awareness.  Patient continues to demonstrate the following deficits: muscle weakness, impaired timing and sequencing, abnormal tone, unbalanced muscle activation, motor apraxia, ataxia, decreased motor planning, decreased attention to right, decreased memory, delayed processing and expressive aphasia and therefore will continue to benefit from skilled PT intervention to enhance overall performance with activity tolerance, balance, postural control, ability to compensate for deficits, functional use of  right upper extremity and right lower extremity, attention, awareness and coordination.  Patient progressing toward long term goals..  Continue plan of care.  PT Short Term Goals Week 1:  PT Short Term Goal 1 (Week 1): Pt will perform bed mobility with supervision. PT Short Term Goal 1 - Progress (Week 1): Met PT Short Term Goal 2 (Week 1): Pt will perform bed<>w/c transfers with mod A. PT Short Term Goal 2 - Progress (Week 1): Met PT Short Term Goal 3 (Week 1): Pt will maintain static standing balance x 1 min with min A.  PT Short Term Goal 3 - Progress (Week 1): Met PT Short Term Goal 4 (Week 1): Pt will perform gait x 25 ft using LRAD with mod A x 1.  PT Short Term Goal 4 - Progress (Week 1): Met Week 2:  PT Short Term Goal 1 (Week 2): Pt will perform bed<>chair transfer with min A. PT Short Term Goal 2 (Week 2): Pt will perform gait x50' in controlled  environment using LRAD with min A x1. PT Short Term Goal 3 (Week 2): Pt will perform w/c mobility x150' in controlled environment with supervision. PT Short Term Goal 4 (Week 2): Pt will perform dynamic standing balance x1 minute with min A.  Skilled Therapeutic Interventions/Progress Updates:    Pt received seated in w/c with quick release belt on wearing R AFO; agreeable to therapy. Session focused on static/dynamic standing balance, functional transfers, and gait training. In addition to R Blue Rocker AFO for R ankle dorsiflexion assist and proximal RLE stability, added heel lift to R shoe to prevent R genu recurvatum during standing/gait. Also noted pt difficulty initiating RLE advancement during pre-gait activities (described below); therefore, tied pillow case to toe of R shoe to decrease friction. Pillow case very effective in the following: facilitating R hip flexion during gait, decreasing L lateral trunk lean to compensate for limited R hip flexion, and increasing overall fluidity of gait pattern. Educated pt on use of R toe cap to achieve similar purpose; will follow up with orthotist if necessary. Pt performed gait 2x25' in controlled environment with mod A using rolling walker, R hand orthosis, R AFO (Blue Rocker), and R heel lift. Performed w/c mobility x150' in controlled environment with bilat LE's and RUE requiring supervision, increased time. Performed stand pivot transfer from w/c>recliner without device with min A. Therapist departed with pt seated in recliner with quick release belt on for safety, bilat LE's elevated, and all needs within reach.  Therapy Documentation Precautions:  Precautions Precautions: Fall Restrictions Weight Bearing Restrictions: No Vital Signs: Therapy Vitals BP: 138/92  mmHg Pain: Pain Assessment Pain Assessment: No/denies pain Pain Score: 0-No pain Locomotion : Ambulation Ambulation/Gait Assistance: 3: Mod assist Wheelchair Mobility Distance: 150   NMR: Neuromuscular Facilitation: Right;Lower Extremity;Activity to increase motor control;Activity to increase timing and sequencing;Upper Extremity;Forced use;Activity to increase coordination;Activity to increase sustained activation;Activity to increase lateral weight shifting Pt performed multiple sit<>stand transfers from mat table with supervision to min A, tactile cueing for anterior weight shift; tactile cueing at R hand for forced use during transfers; tactile cueing at R knee to increase weightbearing. With stand>sit, verbal/tactile cueing focused on reaching back with bilat UE's and performing anterior weight shift.   With mirror positioned in front of pt for visual feedback, pt perform static standing >1 minute with close supervision to min guard; no overt LOB. Posture grossly at midline throughout. In standing, performed M/L weight shifting for pre-gait; verbal cueing for R knee extension with effective return demonstration; reinforcement required throughout session (especially with gait). Transitioned to L weight shift followed by RLE stepping. Pt required verbal/tactile cueing for grading of L weight shift.   See FIM for current functional status  Therapy/Group: Individual Therapy  Trenda Corliss, [name] 12/28/2013, 10:50 AM

## 2013-12-28 NOTE — Progress Notes (Signed)
Speech Language Pathology Daily Session Note  Patient Details  Name: [name] MRN: [id number] Date of Birth: [birth year]  Today's Date: 12/28/2013 Time: 1120-1200 Time Calculation (min): 40 min  Short Term Goals: Week 2: SLP Short Term Goal 1 (Week 2): Pt will utilize swallowing compensatory strategies with Mod I to minimize overt s/s of aspiration.  SLP Short Term Goal 2 (Week 2): Pt will demonstrate effective mastication of regular textures with Mod I without overt s/s of aspiration.  SLP Short Term Goal 3 (Week 2): Pt will attend to the right field of environment during functional tasks with supervision cues.  SLP Short Term Goal 4 (Week 2): Pt will demonstrate functional problem solving for basic and familiar tasks with supervision mulimodal cues.  SLP Short Term Goal 5 (Week 2): Pt will self-monitor and correct verbal errors with min  A multimodal cues.   Skilled Therapeutic Interventions:  Pt was seen for skilled speech therapy targeting expressive and receptive language.  Pt was 100% accurate for receptive naming tasks independently and followed abstract multi-step commands with supervision cues for 100% accuracy.  Pt was 70% accurate for confrontational naming of items found in room; multi-syllabic words were more difficult than single syllable words.  SLP facilitated improved naming of items to 100% accuracy with mod assist phonemic cues as pt was noted to correctly generate the initial phoneme of targeted words with breakdown noted in transitions between syllables.  Pt also benefited from being able to handle targeted naming items and could describe function of object x1  as a compensatory strategy for word finding with min question cues. Per RN report, pt has been tolerating diet well with no cuing needed for use of swallowing precautions during full supervision with meals.  As a result, SLP will initiate a trial of intermittent supervision with meals.  SLP reviewed and reinforced  swallowing precautions with pt including limiting environmental distractions (TV off), small bites/sips, slow rate, and go slow.  Pt verbalized understanding and directed his tray set up to maximize functional independence during meals with modified independence.    FIM:  Comprehension Comprehension Mode: Auditory Comprehension: 5-Understands basic 90% of the time/requires cueing < 10% of the time Expression Expression Mode: Verbal Expression: 3-Expresses basic 50 - 74% of the time/requires cueing 25 - 50% of the time. Needs to repeat parts of sentences. Social Interaction Social Interaction: 5-Interacts appropriately 90% of the time - Needs monitoring or encouragement for participation or interaction. Problem Solving Problem Solving: 4-Solves basic 75 - 89% of the time/requires cueing 10 - 24% of the time Memory Memory: 3-Recognizes or recalls 50 - 74% of the time/requires cueing 25 - 49% of the time FIM - Eating Eating Activity: 5: Set-up assist for open containers;5: Supervision/cues  Pain Pain Assessment Pain Assessment: No/denies pain  Therapy/Group: Individual Therapy  Jackalyn Lombard, M.A. CCC-SLP  [name], [name] 12/28/2013, 1:58 PM

## 2013-12-29 ENCOUNTER — Inpatient Hospital Stay (HOSPITAL_COMMUNITY): Payer: Medicare Other | Admitting: Speech Pathology

## 2013-12-29 ENCOUNTER — Ambulatory Visit: Payer: Medicare Other

## 2013-12-29 ENCOUNTER — Inpatient Hospital Stay (HOSPITAL_COMMUNITY): Payer: Medicare Other | Admitting: Physical Therapy

## 2013-12-29 ENCOUNTER — Inpatient Hospital Stay (HOSPITAL_COMMUNITY): Payer: Medicare Other | Admitting: Occupational Therapy

## 2013-12-29 LAB — GLUCOSE, CAPILLARY
GLUCOSE-CAPILLARY: 92 mg/dL (ref 70–99)
Glucose-Capillary: 122 mg/dL — ABNORMAL HIGH (ref 70–99)
Glucose-Capillary: 132 mg/dL — ABNORMAL HIGH (ref 70–99)
Glucose-Capillary: 124 mg/dL — ABNORMAL HIGH (ref 70–99)

## 2013-12-29 NOTE — Progress Notes (Signed)
Occupational Therapy Session Note  Patient Details  Name: [name] MRN: [id number] Date of Birth: [birth year]  Today's Date: 12/29/2013 Time: 1100-1200 Time Calculation (min): 60 min  Short Term Goals: Week 2:  OT Short Term Goal 1 (Week 2): Patient will transfer to toilet with min assist OT Short Term Goal 2 (Week 2): Patient will dress upper body with min assist  OT Short Term Goal 3 (Week 2): Patient will dress lower body with min assist OT Short Term Goal 4 (Week 2): Patient will transfer to tub bench with mod assist OT Short Term Goal 5 (Week 2): Patient will bathe self with min assist  Skilled Therapeutic Interventions/Progress Updates:  Patient resting in w/c upon arrival with QRB in place.  Engaged in self care retraining to include sponge bath at sink (declined shower) and dressing.  Focused session on hemi dressing, RUE management, forced use and attention to RUE, attention to right visual field, sit><stands, standing balance to include RLE with slight knee flexion to encourage RLE activation, weight shifts to the left secondary to leaning to right (sometimes heavy leaning), postural control, forward weight shifts and hip flexion as he transitions stand>sit.  Patient required additional assist today with donning shirt secondary to right arm not far enough through shirt sleeve before he placed shirt over head and right hand fell out of shirt and patient had to start over with assist.  Patient was successful with vcs.  Therapy Documentation Precautions:  Precautions Precautions: Fall Restrictions Weight Bearing Restrictions: No Pain:   ADL: See FIM for current functional status  Therapy/Group: Individual Therapy  SHAFFER, CHRISTINA 12/29/2013, 1:57 PM

## 2013-12-29 NOTE — Progress Notes (Signed)
Speech Language Pathology Daily Session Note  Patient Details  Name: [name] MRN: [id number] Date of Birth: 09-02-1970  Today's Date: 12/29/2013 Time: 0950-1030 Time Calculation (min): 40 min  Short Term Goals: Week 2: SLP Short Term Goal 1 (Week 2): Pt will utilize swallowing compensatory strategies with Mod I to minimize overt s/s of aspiration.  SLP Short Term Goal 2 (Week 2): Pt will demonstrate effective mastication of regular textures with Mod I without overt s/s of aspiration.  SLP Short Term Goal 3 (Week 2): Pt will attend to the right field of environment during functional tasks with supervision cues.  SLP Short Term Goal 4 (Week 2): Pt will demonstrate functional problem solving for basic and familiar tasks with supervision mulimodal cues.  SLP Short Term Goal 5 (Week 2): Pt will self-monitor and correct verbal errors with min  A multimodal cues.   Skilled Therapeutic Interventions:  Pt was seen for skilled speech therapy targeting verbal expression for functional communication.  During a generative naming task with initial phoneme cues to assist in naming specific members of targeted categories, pt benefited from increased processing time and required overall mod assist for self-monitoring and correcting of paraphasic errors.    Improvements were most noted with semantic cues from the SLP to facilitate naming and pt was noted to use description at least once during structured tasks to compensate in moments of word finding difficulty. SLP encouraged and reinforced pt's independent use of compensatory strategies for word finding and elaborated with education related to additional strategies including gesturing and initial phoneme prompts.  Continue per current plan of care.    FIM:  Comprehension Comprehension Mode: Auditory Comprehension: 5-Understands basic 90% of the time/requires cueing < 10% of the time Expression Expression Mode: Verbal Expression: 3-Expresses basic 50 -  74% of the time/requires cueing 25 - 50% of the time. Needs to repeat parts of sentences. Social Interaction Social Interaction: 5-Interacts appropriately 90% of the time - Needs monitoring or encouragement for participation or interaction. Problem Solving Problem Solving: 4-Solves basic 75 - 89% of the time/requires cueing 10 - 24% of the time Memory Memory: 3-Recognizes or recalls 50 - 74% of the time/requires cueing 25 - 49% of the time FIM - Eating Eating Activity: 5: Set-up assist for open containers;5: Supervision/cues  Pain Pain Assessment Pain Assessment: No/denies pain  Therapy/Group: Individual Therapy  Jackalyn Lombard, M.A. CCC-SLP  Page, [name] 12/29/2013, 4:13 PM

## 2013-12-29 NOTE — Progress Notes (Signed)
Physical Therapy Session Note  Patient Details  Name: [name] MRN: [id number] Date of Birth: [birth year]  Today's Date: 12/29/2013 Time: 0950-1030 Time Calculation (min): 40 min  Short Term Goals: Week 2:  PT Short Term Goal 1 (Week 2): Pt will perform bed<>chair transfer with min A. PT Short Term Goal 2 (Week 2): Pt will perform gait x50' in controlled environment using LRAD with min A x1. PT Short Term Goal 3 (Week 2): Pt will perform w/c mobility x150' in controlled environment with supervision. PT Short Term Goal 4 (Week 2): Pt will perform dynamic standing balance x1 minute with min A.  Skilled Therapeutic Interventions/Progress Updates:    Pt received seated in w/c with quick release belt on; agreeable to therapy. Pt wearing R AFO (Blue Rocker) and R heel lifts. Session focused on gait training, assessing pt need for personal R ankle-foot orthotic. Pt performed w/c mobility x120' in controlled environment with bilat LE's requiring supervision, increased time. Stand pivot transfer from w/c<>mat table with rolling walker, R hand orthosis, mod A, and manual facilitation of L weight shift, tactile cueing for RLE stepping during pivoting. Performed gait 3x20' in controlled environment with rolling walker, R hand orthosis requiring mod-max A, and manual facilitation of the following: weight shift to L, forward rotation of pelvis on R to initiate RLE advancement. Tactile cueing at R hip flexors to initiate RLE step. Manually blocked posterior R knee to prevent genu recurvatum during RLE mid-terminal stance.   Orthotist, Thayer Ohm, present during final 30 minutes of session to assess/address pt need for R ankle-foot orthotic. Orthotist placed thicker heel lift in R shoe to further prevent R rgnu recurvatum. Pt performed gait x50' in controlled environment with rolling walker and max A overall, +2A for w/c follow and manual facilitation/cueing as described above. Per orthotist, R AFO with knee cage  attachment will accommodate pt's thigh circumference. Therefore, custom R AFO will be necessary to control R genu recurvatum. Will plan on pursuing custom R AFO if improvement in RLE attention, proprioception is not adequate to control recurvatum prior to D/C. Session ended in pt room, where pt was left seated in w/c with quick release belt on and all needs within reach.  Therapy Documentation Precautions:  Precautions Precautions: Fall Restrictions Weight Bearing Restrictions: No Pain: Pain Assessment Pain Assessment: No/denies pain  See FIM for current functional status  Therapy/Group: Individual Therapy  Hobble, Lorenda Ishihara 12/29/2013, 7:55 PM

## 2013-12-29 NOTE — Progress Notes (Signed)
Social Work Lucy Chris, LCSW Social Worker Signed  Patient Care Conference Service date: 12/29/2013 12:49 PM  Inpatient RehabilitationTeam Conference and Plan of Care Update Date: 12/29/2013   Time: 10;30 AM     Patient Name: [name]       Medical Record Number: [id number]   Date of Birth: 12-12-1970 Sex: Male         Room/Bed: 4M03C/4M03C-01 Payor Info: Payor: MEDICARE / Plan: MEDICARE PART A AND B / Product Type: *No Product type* /   Admitting Diagnosis: L  MCA AND BG CVA syncope   Admit Date/Time:  12/17/2013  2:43 PM Admission Comments: No comment available   Primary Diagnosis:  CVA (cerebral infarction) Principal Problem: CVA (cerebral infarction)    Patient Active Problem List     Diagnosis  Date Noted   .  CVA (cerebral infarction)  12/17/2013   .  HTN (hypertension)  12/17/2013   .  Diabetes  12/17/2013   .  Cardiomyopathy  12/17/2013     Expected Discharge Date: Expected Discharge Date: 01/07/14  Team Members Present: Social Worker Present: Dossie Der, LCSW Nurse Present: Carlean Purl, RN PT Present: Wanda Plump, PT;Blair Hobble, PT OT Present: Bretta Bang, OT SLP Present: Other (comment) Joni Reining Page-SP) PPS Coordinator present : Tora Duck, RN, CRRN        Current Status/Progress  Goal  Weekly Team Focus   Medical     aphasia, cog defs, bowel inc at times due to initiation, poor awareness  Improve incont  bowel program, toileting program   Bowel/Bladder     Continent of bowel and bladder; LBM today following bowel program of suppository... patient was continent of bowel before implementing bowel program. Do we still need this?  Maintain continence of bowel and bladder with mod assist  Timed toileting, urinal, continue bowel program   Swallow/Nutrition/ Hydration     Dys 3 textures and thin liquids with trial of intermittent supervision   least restrictive PO intake  carryover of safe swallowing strategies    ADL's     mod assist   supervision / min assist  Improve postural control, sustained muscle activation right extremities, family education with brother   Mobility     Supervision-Min A bed mobility; Mod-Max A transfers; Mod A gait (+2A for w/c follow)  Supervision to Min A  postural stability, bed mobility, functional transfers, gait   Communication     min-mod assist   supervision   use of strategies during functional conversations    Safety/Cognition/ Behavioral Observations    min assist   supervision   increase self-monitoring and correcting of verbal errors, awareness   Pain     Denies pain  < 3  Assess and treat for pain q shift and prn   Skin     Loop recorder left chest- steri strips intact  No skin breakdown or infection during rehab stay with mod assist  Assess skin q shift and prn     *See Care Plan and progress notes for long and short-term goals.    Barriers to Discharge:  Ltd. resources at home, brother works part time, in process of moving      Possible Resolutions to Barriers:    Social work to assist with discharge planning, see above      Discharge Planning/Teaching Needs:    Brother back in [place] making arrangements for living and moving them.  Plans to be back prior to pt';s discharge to go through education  with him      Team Discussion:    Making good progress-better with his yes/no.  Receptive language better.  Timed toileting.  Brother working on housing situation   Revisions to Treatment Plan:    None    Continued Need for Acute Rehabilitation Level of Care: The patient requires daily medical management by a physician with specialized training in physical medicine and rehabilitation for the following conditions: Daily direction of a multidisciplinary physical rehabilitation program to ensure safe treatment while eliciting the highest outcome that is of practical value to the patient.: Yes Daily medical management of patient stability for increased activity during  participation in an intensive rehabilitation regime.: Yes Daily analysis of laboratory values and/or radiology reports with any subsequent need for medication adjustment of medical intervention for : Neurological problems;Other  Lucy ChrisDupree, Rebecca G 12/29/2013, 12:49 PM         Lucy Chrisebecca G Dupree, LCSW Social Worker Signed  Patient Care Conference Service date: 12/22/2013 1:09 PM  Inpatient RehabilitationTeam Conference and Plan of Care Update Date: 12/22/2013   Time: 11;40 Am     Patient Name: [name]       Medical Record Number: [id number]   Date of Birth: 01-May-1971 Sex: Male         Room/Bed: 4M03C/4M03C-01 Payor Info: Payor: MEDICARE / Plan: MEDICARE PART A AND B / Product Type: *No Product type* /   Admitting Diagnosis: L  MCA AND BG CVA syncope   Admit Date/Time:  12/17/2013  2:43 PM Admission Comments: No comment available   Primary Diagnosis:  CVA (cerebral infarction) Principal Problem: CVA (cerebral infarction)    Patient Active Problem List     Diagnosis  Date Noted   .  CVA (cerebral infarction)  12/17/2013   .  HTN (hypertension)  12/17/2013   .  Diabetes  12/17/2013   .  Cardiomyopathy  12/17/2013     Expected Discharge Date: Expected Discharge Date: 01/07/14  Team Members Present: Physician leading conference: Dr. Claudette LawsAndrew Kirsteins Social Worker Present: Dossie DerBecky DuPree, LCSW Nurse Present: Carlean PurlMaryann Barbour, RN PT Present: Wanda Plumparoline Cook, PT;Blair Hobble, PT OT Present: Bretta BangKris Gellert, OT SLP Present: Other (comment);Fae PippinMelissa Bowie, SLP Joni Reining(Nicole Page-Sp) PPS Coordinator present : Tora DuckMarie Noel, RN, CRRN;Becky Henrene DodgeWind[place], PT        Current Status/Progress  Goal  Weekly Team Focus   Medical     Aphasia, cognitive deficits, right hemiparesis  Improve mobility and communication  Improve transfers and balance   Bowel/Bladder     Incontinent of bowel and bladder. LBM 12/21/13 after suppository  Managed bowel and bladder  Timed toileting during the day. Condom cath @[facility]     Swallow/Nutrition/ Hydration     Dys.3 textures and thin liquids with full supervision   least restrictive PO intake  carryover of safe swallow strategies    ADL's     Max assist  supervision / min assist  Improve postural control, initiation, sustained muscle activation right extremities, family education with brother   Mobility     Mod-Max A bed mobility; Max-Total A transfers; +2A gait  Supervision to Min A  postural stability, bed mobility, functional transfers, gait, initiation of family education/training   Communication     Mod assist   Supervision   education and use of strategies   Safety/Cognition/ Behavioral Observations    Min-Mod assist  Supervision   increase self-monitoring and correcting and awareness   Pain     No c/o pain  <3  Monitor for nonverbal  cues of pain   Skin     Loop recorder with dressing, intact  No additional skin breakdown  Monitor q shift     *See Care Plan and progress notes for long and short-term goals.    Barriers to Discharge:  Ltd. resources at home, brother works part time, in process of moving      Possible Resolutions to Barriers:    Social work to assist with discharge planning      Discharge Planning/Teaching Needs:    Home with brother who plans to provide care, brother may go home and return prior to pt's discharge, so he can work some.  Discussed rehab will not be providing him food while here.      Team Discussion:    Timed toileting work on continency. Good carryover with therapies and making good progress. Yes/no not always accurate.  Brother is supportive and staying here with him, but may need to go home to work on living arrangements.   Revisions to Treatment Plan:    None    Continued Need for Acute Rehabilitation Level of Care: The patient requires daily medical management by a physician with specialized training in physical medicine and rehabilitation for the following conditions: Daily direction of a multidisciplinary  physical rehabilitation program to ensure safe treatment while eliciting the highest outcome that is of practical value to the patient.: Yes Daily medical management of patient stability for increased activity during participation in an intensive rehabilitation regime.: Yes Daily analysis of laboratory values and/or radiology reports with any subsequent need for medication adjustment of medical intervention for : Neurological problems  Lucy Chris 12/24/2013, 9:10 AM          Patient ID: [name], male   DOB: 07-19-1970, 43 y.o.   MRN: [id number]

## 2013-12-29 NOTE — Progress Notes (Signed)
Occupational Therapy Session Note  Patient Details  Name: [name] MRN: [id number] Date of Birth: [birth year]  Today's Date: 12/29/2013 Time: 1500-1530 Time Calculation (min): 30 min  Short Term Goals: Week 2:  OT Short Term Goal 1 (Week 2): Patient will transfer to toilet with min assist OT Short Term Goal 2 (Week 2): Patient will dress upper body with min assist  OT Short Term Goal 3 (Week 2): Patient will dress lower body with min assist OT Short Term Goal 4 (Week 2): Patient will transfer to tub bench with mod assist OT Short Term Goal 5 (Week 2): Patient will bathe self with min assist  Skilled Therapeutic Interventions/Progress Updates:    Patient seen this pm for OT intervention to address neuromuscular re-education to right upper extremity.  Patient is incorporating and attending to right upper extremity less frequently during ADL tasks, but has spontaneous, functional movement when using right upper extremity in closed chain conditions as part of his base of support.  Patient, after weight bearing, can isolate elbow flexion, and extension both with and against gravity.  Patient lacks the sensation and motor control to volitionally incorporate this movement into more fine motor tasks.  Patient transferred via stand step method toward stronger left side with min assist, while wearing AFO.  The same transfer moving toward his weaker right side required moderate assistance.    Therapy Documentation Precautions:  Precautions Precautions: Fall Restrictions Weight Bearing Restrictions: No  Pain:  Denies pain  See FIM for current functional status  Therapy/Group: Individual Therapy  Collier Salina 12/29/2013, 3:31 PM

## 2013-12-29 NOTE — Progress Notes (Signed)
Subjective/Complaints: 43 year old male with history of DM type 2, seizure disorder, HTN, prior CVA who was admitted to [facility] on 12/14/13 with weakness X 2 days and difficulty talking. Patient had been out of BP medications X 2 1/2 months. CT head with stable hydrocephalus. MRI brain done revealing acute infarct left corona radiata, lentiform nuclei, left posterior temporal lobe and chronic severe ventriculomegaly/hydrocephalus  Aphasic,trying to explain bowel problem, c/o having BMs in bed in evenings  Review of Systems - unable to obtain secondary to aphasia  Objective: Vital Signs: Blood pressure 125/78, pulse 72, temperature 98 F (36.7 C), temperature source Oral, resp. rate 19, SpO2 100.00%. No results found. Results for orders placed during the hospital encounter of 12/17/13 (from the past 72 hour(s))  GLUCOSE, CAPILLARY     Status: Abnormal   Collection Time    12/26/13  7:51 AM      Result Value Ref Range   Glucose-Capillary 140 (*) 70 - 99 mg/dL   Comment 1 Notify RN     Comment 2 Documented in Chart    GLUCOSE, CAPILLARY     Status: Abnormal   Collection Time    12/26/13 11:02 AM      Result Value Ref Range   Glucose-Capillary 275 (*) 70 - 99 mg/dL   Comment 1 Notify RN     Comment 2 Documented in Chart    GLUCOSE, CAPILLARY     Status: Abnormal   Collection Time    12/26/13  4:42 PM      Result Value Ref Range   Glucose-Capillary 65 (*) 70 - 99 mg/dL   Comment 1 Documented in Chart    GLUCOSE, CAPILLARY     Status: None   Collection Time    12/26/13  5:22 PM      Result Value Ref Range   Glucose-Capillary 95  70 - 99 mg/dL   Comment 1 Documented in Chart    GLUCOSE, CAPILLARY     Status: Abnormal   Collection Time    12/26/13  9:02 PM      Result Value Ref Range   Glucose-Capillary 196 (*) 70 - 99 mg/dL   Comment 1 Notify RN    GLUCOSE, CAPILLARY     Status: Abnormal   Collection Time    12/27/13  7:26 AM      Result Value Ref Range   Glucose-Capillary 130  (*) 70 - 99 mg/dL   Comment 1 Notify RN    GLUCOSE, CAPILLARY     Status: Abnormal   Collection Time    12/27/13 11:19 AM      Result Value Ref Range   Glucose-Capillary 183 (*) 70 - 99 mg/dL   Comment 1 Notify RN    GLUCOSE, CAPILLARY     Status: Abnormal   Collection Time    12/27/13  4:27 PM      Result Value Ref Range   Glucose-Capillary 114 (*) 70 - 99 mg/dL  GLUCOSE, CAPILLARY     Status: Abnormal   Collection Time    12/27/13  9:37 PM      Result Value Ref Range   Glucose-Capillary 133 (*) 70 - 99 mg/dL  GLUCOSE, CAPILLARY     Status: Abnormal   Collection Time    12/28/13  7:04 AM      Result Value Ref Range   Glucose-Capillary 125 (*) 70 - 99 mg/dL  GLUCOSE, CAPILLARY     Status: Abnormal   Collection Time  12/28/13 11:10 AM      Result Value Ref Range   Glucose-Capillary 146 (*) 70 - 99 mg/dL  GLUCOSE, CAPILLARY     Status: Abnormal   Collection Time    12/28/13  4:23 PM      Result Value Ref Range   Glucose-Capillary 194 (*) 70 - 99 mg/dL  GLUCOSE, CAPILLARY     Status: Abnormal   Collection Time    12/28/13  9:25 PM      Result Value Ref Range   Glucose-Capillary 151 (*) 70 - 99 mg/dL      Mood and affect are appropriate  Heart: Regular rate and rhythm no rubs murmurs or extra sounds  Lungs: Clear to auscultation, breathing unlabored, no rales or wheezes  Abdomen: Positive bowel sounds, soft nontender to palpation, nondistended  Extremities: No clubbing, cyanosis, or edema  Skin: No evidence of breakdown except small area on L forearm, no evidence of rash  Neurologic: Cranial nerves II through XII intact, motor strength is 5/5 in Left deltoid, bicep, tricep, grip, hip flexor, knee extensors, ankle dorsiflexor and plantar flexor  3 minus/5 in the left biceps triceps and deltoid 4 minus at grip, 4 minus right hip flexor knee extensor ankle dorsiflexor plantar flexor  Decreased sensation in Right hand Speech reduced verbal output, able to follow basic  commands Oriented to person, place ([place]) and Month,   Assessment/Plan: 1. Functional deficits secondary to Left CR, Lentiform nucleus, temporal infarct which require 3+ hours per day of interdisciplinary therapy in a comprehensive inpatient rehab setting. Physiatrist is providing close team supervision and 24 hour management of active medical problems listed below. Physiatrist and rehab team continue to assess barriers to discharge/monitor patient progress toward functional and medical goals.  FIM: FIM - Bathing Bathing Steps Patient Completed: Chest;Right Arm;Abdomen;Left Arm;Front perineal area;Buttocks;Right upper leg;Left upper leg;Right lower leg (including foot);Left lower leg (including foot) Bathing: 4: Steadying assist  FIM - Upper Body Dressing/Undressing Upper body dressing/undressing steps patient completed: Thread/unthread left sleeve of pullover shirt/dress;Put head through opening of pull over shirt/dress;Pull shirt over trunk Upper body dressing/undressing: 4: Min-Patient completed 75 plus % of tasks FIM - Lower Body Dressing/Undressing Lower body dressing/undressing steps patient completed: Thread/unthread left pants leg;Thread/unthread right pants leg;Don/Doff left shoe;Thread/unthread left underwear leg Lower body dressing/undressing: 3: Mod-Patient completed 50-74% of tasks  FIM - Hotel managerToileting Toileting Assistive Devices: Grab bar or rail for support Toileting: 0: Activity did not occur  FIM - Diplomatic Services operational officerToilet Transfers Toilet Transfers Assistive Devices: Grab bars Toilet Transfers: 3-To toilet/BSC: Mod A (lift or lower assist);3-From toilet/BSC: Mod A (lift or lower assist)  FIM - Bed/Chair Transfer Bed/Chair Transfer Assistive Devices: Arm rests;Bed rails Bed/Chair Transfer: 3: Chair or W/C > Bed: Mod A (lift or lower assist);4: Bed > Chair or W/C: Min A (steadying Pt. > 75%)  FIM - Locomotion: Wheelchair Distance: 150 Locomotion: Wheelchair: 5: Travels 150 ft or  more: maneuvers on rugs and over door sills with supervision, cueing or coaxing FIM - Locomotion: Ambulation Locomotion: Ambulation Assistive Devices: Walker - Rolling;Orthosis;Other (comment) (R AFO, R hand orthosis) Ambulation/Gait Assistance: 3: Mod assist Locomotion: Ambulation: 1: Travels less than 50 ft with moderate assistance (Pt: 50 - 74%)  Comprehension Comprehension Mode: Auditory Comprehension: 5-Understands basic 90% of the time/requires cueing < 10% of the time  Expression Expression Mode: Verbal Expression: 3-Expresses basic 50 - 74% of the time/requires cueing 25 - 50% of the time. Needs to repeat parts of sentences.  Social Interaction Social  Interaction: 5-Interacts appropriately 90% of the time - Needs monitoring or encouragement for participation or interaction.  Problem Solving Problem Solving: 4-Solves basic 75 - 89% of the time/requires cueing 10 - 24% of the time  Memory Memory: 3-Recognizes or recalls 50 - 74% of the time/requires cueing 25 - 49% of the time   Medical Problem List and Plan:  1. Functional deficits secondary to embolic infarcts left corona radiata, left lentiform nucleus, left temporal lobe with R Hemiparesis and aphasia  2. DVT Prophylaxis/Anticoagulation: Pharmaceutical: Lovenox  3. Pain Management: Tylenol 650 mg every 4 hours when necessary  4. Mood: Monitor for post stroke depression , flat affect, trial celexa 5. Neuropsych: This patient is not capable of making decisions on his own behalf.  6. HTN: 7. DM type 2: uncontrolled Glucophage d/ced secondary to elevated creat, started amaryl dose increased 6/20, monitor for hypo glycemia 8. Seizure disorder:  9.  Acute renal insufficiency, suspect ACE-I, HCTZ +/- reduced fluid intake, will reduce lisinopril and HCTZ, enc po, monitor BMET , recheck  10.  Bowel inc, start bowel program, senna qhs , dulc qam LOS (Days) 12 A FACE TO FACE EVALUATION WAS PERFORMED  KIRSTEINS,ANDREW  E 12/29/2013, 6:43 AM

## 2013-12-29 NOTE — Patient Care Conference (Signed)
Inpatient RehabilitationTeam Conference and Plan of Care Update Date: 12/29/2013   Time: 10;30 AM    Patient Name: [name]      Medical Record Number: [id number]  Date of Birth: 06/16/1971 Sex: Male         Room/Bed: 4M03C/4M03C-01 Payor Info: Payor: MEDICARE / Plan: MEDICARE PART A AND B / Product Type: *No Product type* /    Admitting Diagnosis: L  MCA AND BG CVA syncope  Admit Date/Time:  12/17/2013  2:43 PM Admission Comments: No comment available   Primary Diagnosis:  CVA (cerebral infarction) Principal Problem: CVA (cerebral infarction)  Patient Active Problem List   Diagnosis Date Noted  . CVA (cerebral infarction) 12/17/2013  . HTN (hypertension) 12/17/2013  . Diabetes 12/17/2013  . Cardiomyopathy 12/17/2013    Expected Discharge Date: Expected Discharge Date: 01/07/14  Team Members Present: Social Worker Present: Dossie DerBecky DuPree, LCSW Nurse Present: Carlean PurlMaryann Barbour, RN PT Present: Wanda Plumparoline Cook, PT;Blair Hobble, PT OT Present: Bretta BangKris Gellert, OT SLP Present: Other (comment) Joni Reining(Nicole Page-SP) PPS Coordinator present : Tora DuckMarie Noel, RN, CRRN     Current Status/Progress Goal Weekly Team Focus  Medical   aphasia, cog defs, bowel inc at times due to initiation, poor awareness  Improve incont  bowel program, toileting program   Bowel/Bladder   Continent of bowel and bladder; LBM today following bowel program of suppository... patient was continent of bowel before implementing bowel program. Do we still need this?  Maintain continence of bowel and bladder with mod assist  Timed toileting, urinal, continue bowel program   Swallow/Nutrition/ Hydration   Dys 3 textures and thin liquids with trial of intermittent supervision   least restrictive PO intake  carryover of safe swallowing strategies    ADL's   mod assist  supervision / min assist  Improve postural control, sustained muscle activation right extremities, family education with brother   Mobility   Supervision-Min  A bed mobility; Mod-Max A transfers; Mod A gait (+2A for w/c follow)  Supervision to Min A  postural stability, bed mobility, functional transfers, gait   Communication   min-mod assist   supervision   use of strategies during functional conversations    Safety/Cognition/ Behavioral Observations  min assist   supervision   increase self-monitoring and correcting of verbal errors, awareness   Pain   Denies pain  < 3  Assess and treat for pain q shift and prn   Skin   Loop recorder left chest- steri strips intact  No skin breakdown or infection during rehab stay with mod assist  Assess skin q shift and prn      *See Care Plan and progress notes for long and short-term goals.  Barriers to Discharge: Ltd. resources at home, brother works part time, in process of moving    Possible Resolutions to Barriers:  Social work to assist with discharge planning, see above    Discharge Planning/Teaching Needs:  [place] back in [place] arrangements for living and moving them.  Plans to be back prior to pt';s discharge to go through education with him      Team Discussion:  Making good progress-better with his yes/no.  Receptive language better.  Timed toileting.  Brother working on housing situation  Revisions to Treatment Plan:  None   Continued Need for Acute Rehabilitation Level of Care: The patient requires daily medical management by a physician with specialized training in physical medicine and rehabilitation for the following conditions: Daily direction of a multidisciplinary physical rehabilitation  program to ensure safe treatment while eliciting the highest outcome that is of practical value to the patient.: Yes Daily medical management of patient stability for increased activity during participation in an intensive rehabilitation regime.: Yes Daily analysis of laboratory values and/or radiology reports with any subsequent need for medication adjustment of medical intervention for  : Neurological problems;Other  Lucy Chris 12/29/2013, 12:49 PM

## 2013-12-29 NOTE — Progress Notes (Signed)
Social Work Patient ID: [name], male   DOB: 02-17-1971, 43 y.o.   MRN: [id number] Met with pt to inform of team conference progression toward goals and discharge still 7/3.  He is in ageement with the plan and aware his brother is home Trying to get their housing taken care of.  Will try to reach brother to let him know, through a friends number, since they have no phone.  Pt appears to understand What is said to him just has more difficulty expressing himself.  Continue to work on discharge plans.

## 2013-12-30 ENCOUNTER — Ambulatory Visit (HOSPITAL_COMMUNITY): Payer: Medicare Other | Admitting: Speech Pathology

## 2013-12-30 ENCOUNTER — Encounter (HOSPITAL_COMMUNITY): Payer: Medicare Other | Admitting: Occupational Therapy

## 2013-12-30 ENCOUNTER — Inpatient Hospital Stay (HOSPITAL_COMMUNITY): Payer: Medicare Other | Admitting: Occupational Therapy

## 2013-12-30 ENCOUNTER — Inpatient Hospital Stay (HOSPITAL_COMMUNITY): Payer: Medicare Other | Admitting: *Deleted

## 2013-12-30 DIAGNOSIS — G8110 Spastic hemiplegia affecting unspecified side: Secondary | ICD-10-CM

## 2013-12-30 DIAGNOSIS — I6330 Cerebral infarction due to thrombosis of unspecified cerebral artery: Secondary | ICD-10-CM

## 2013-12-30 LAB — GLUCOSE, CAPILLARY
GLUCOSE-CAPILLARY: 108 mg/dL — AB (ref 70–99)
Glucose-Capillary: 157 mg/dL — ABNORMAL HIGH (ref 70–99)
Glucose-Capillary: 130 mg/dL — ABNORMAL HIGH (ref 70–99)
Glucose-Capillary: 141 mg/dL — ABNORMAL HIGH (ref 70–99)

## 2013-12-30 NOTE — Progress Notes (Signed)
Occupational Therapy Session Note  Patient Details  Name: [name] MRN: [id number] Date of Birth: July 30, 1970  Today's Date: 12/30/2013 Time: 6270-3500 Time Calculation (min): 45 min  Short Term Goals: Week 2:  OT Short Term Goal 1 (Week 2): Patient will transfer to toilet with min assist OT Short Term Goal 2 (Week 2): Patient will dress upper body with min assist  OT Short Term Goal 3 (Week 2): Patient will dress lower body with min assist OT Short Term Goal 4 (Week 2): Patient will transfer to tub bench with mod assist OT Short Term Goal 5 (Week 2): Patient will bathe self with min assist  Skilled Therapeutic Interventions/Progress Updates:    Patient seen this am for OT intervention to address postural control, static stand balance, initiation of own needs, and sustained attention.  Patient indicated need to use the bathroom and had success!  Patient able to stand without support for brief moments today and sustain balance with UE 's in support.  Patient able to don his shirt once oriented correctly with increased time.    Therapy Documentation Precautions:  Precautions Precautions: Fall Restrictions Weight Bearing Restrictions: No   Pain:  Denies pain  See FIM for current functional status  Therapy/Group: Individual Therapy  Collier Salina 12/30/2013, 12:22 PM

## 2013-12-30 NOTE — Progress Notes (Signed)
Speech Language Pathology Daily Session Note  Patient Details  Name: [name] MRN: [id number] Date of Birth: [birth year]  Today's Date: 12/30/2013 Time: 1130-1210 Time Calculation (min): 40 min  Short Term Goals: Week 2: SLP Short Term Goal 1 (Week 2): Pt will utilize swallowing compensatory strategies with Mod I to minimize overt s/s of aspiration.  SLP Short Term Goal 2 (Week 2): Pt will demonstrate effective mastication of regular textures with Mod I without overt s/s of aspiration.  SLP Short Term Goal 3 (Week 2): Pt will attend to the right field of environment during functional tasks with supervision cues.  SLP Short Term Goal 4 (Week 2): Pt will demonstrate functional problem solving for basic and familiar tasks with supervision mulimodal cues.  SLP Short Term Goal 5 (Week 2): Pt will self-monitor and correct verbal errors with min  A multimodal cues.   Skilled Therapeutic Interventions:  Pt was seen for skilled speech therapy targeting dysphagia management and carryover of skilled word retrieval strategies during functional conversations.  Skilled observations were completed on this date during presentations of pt's currently prescribed diet.  No overt s/s of aspiration were noted and pt exhibited effective mastication and clearance of dys 3 textures.  Furthermore, pt was modified independent for use of swallowing precautions and remains appropriate for intermittent supervision during meals.  Pt was noted with significantly improved verbal expression to indicate needs/wants with fewer paraphasic errors in comparison to yesterday's therapy session and SLP facilitated improved carryover of word retrieval strategies utilizing supported conversation techniques.    FIM:  Comprehension Comprehension Mode: Auditory Comprehension: 5-Understands basic 90% of the time/requires cueing < 10% of the time Expression Expression Mode: Verbal Expression: 3-Expresses basic 50 - 74% of the  time/requires cueing 25 - 50% of the time. Needs to repeat parts of sentences. Social Interaction Social Interaction: 5-Interacts appropriately 90% of the time - Needs monitoring or encouragement for participation or interaction. Problem Solving Problem Solving: 4-Solves basic 75 - 89% of the time/requires cueing 10 - 24% of the time Memory Memory: 3-Recognizes or recalls 50 - 74% of the time/requires cueing 25 - 49% of the time FIM - Eating Eating Activity: 6: More than reasonable amount of time  Pain Pain Assessment Pain Assessment: No/denies pain  Therapy/Group: Individual Therapy  Jackalyn Lombard, M.A. CCC-SLP   Page, [name] 12/30/2013, 4:49 PM

## 2013-12-30 NOTE — Progress Notes (Signed)
Subjective/Complaints: 43 year old male with history of DM type 2, seizure disorder, HTN, prior CVA who was admitted to [facility] on 12/14/13 with weakness X 2 days and difficulty talking. Patient had been out of BP medications X 2 1/2 months. CT head with stable hydrocephalus. MRI brain done revealing acute infarct left corona radiata, lentiform nuclei, left posterior temporal lobe and chronic severe ventriculomegaly/hydrocephalus  Per RN bowels are better regulated Has spilled urinal a couple times but no real bladder inc  Review of Systems - unable to obtain secondary to aphasia  Objective: Vital Signs: Blood pressure 132/73, pulse 68, temperature 98 F (36.7 C), temperature source Oral, resp. rate 18, weight 121.6 kg (268 lb 1.3 oz), SpO2 98.00%. No results found. Results for orders placed during the hospital encounter of 12/17/13 (from the past 72 hour(s))  GLUCOSE, CAPILLARY     Status: Abnormal   Collection Time    12/27/13  7:26 AM      Result Value Ref Range   Glucose-Capillary 130 (*) 70 - 99 mg/dL   Comment 1 Notify RN    GLUCOSE, CAPILLARY     Status: Abnormal   Collection Time    12/27/13 11:19 AM      Result Value Ref Range   Glucose-Capillary 183 (*) 70 - 99 mg/dL   Comment 1 Notify RN    GLUCOSE, CAPILLARY     Status: Abnormal   Collection Time    12/27/13  4:27 PM      Result Value Ref Range   Glucose-Capillary 114 (*) 70 - 99 mg/dL  GLUCOSE, CAPILLARY     Status: Abnormal   Collection Time    12/27/13  9:37 PM      Result Value Ref Range   Glucose-Capillary 133 (*) 70 - 99 mg/dL  GLUCOSE, CAPILLARY     Status: Abnormal   Collection Time    12/28/13  7:04 AM      Result Value Ref Range   Glucose-Capillary 125 (*) 70 - 99 mg/dL  GLUCOSE, CAPILLARY     Status: Abnormal   Collection Time    12/28/13 11:10 AM      Result Value Ref Range   Glucose-Capillary 146 (*) 70 - 99 mg/dL  GLUCOSE, CAPILLARY     Status: Abnormal   Collection Time    12/28/13  4:23 PM     Result Value Ref Range   Glucose-Capillary 194 (*) 70 - 99 mg/dL  GLUCOSE, CAPILLARY     Status: Abnormal   Collection Time    12/28/13  9:25 PM      Result Value Ref Range   Glucose-Capillary 151 (*) 70 - 99 mg/dL  GLUCOSE, CAPILLARY     Status: Abnormal   Collection Time    12/29/13  7:15 AM      Result Value Ref Range   Glucose-Capillary 122 (*) 70 - 99 mg/dL  GLUCOSE, CAPILLARY     Status: Abnormal   Collection Time    12/29/13 12:16 PM      Result Value Ref Range   Glucose-Capillary 132 (*) 70 - 99 mg/dL   Comment 1 Notify RN    GLUCOSE, CAPILLARY     Status: None   Collection Time    12/29/13  4:44 PM      Result Value Ref Range   Glucose-Capillary 92  70 - 99 mg/dL  GLUCOSE, CAPILLARY     Status: Abnormal   Collection Time    12/29/13  9:10 PM  Result Value Ref Range   Glucose-Capillary 124 (*) 70 - 99 mg/dL      Mood and affect are appropriate  Heart: Regular rate and rhythm no rubs murmurs or extra sounds  Lungs: Clear to auscultation, breathing unlabored, no rales or wheezes  Abdomen: Positive bowel sounds, soft nontender to palpation, nondistended  Extremities: No clubbing, cyanosis, or edema  Skin: No evidence of breakdown except small area on L forearm, no evidence of rash  Neurologic: Cranial nerves II through XII intact, motor strength is 5/5 in Left deltoid, bicep, tricep, grip, hip flexor, knee extensors, ankle dorsiflexor and plantar flexor  3 minus/5 in the left biceps triceps and deltoid 4 minus at grip, 4 minus right hip flexor knee extensor ankle dorsiflexor plantar flexor  Decreased sensation in Right hand Speech reduced verbal output, able to follow basic commands Oriented to person, place ([place]) and Month,   Assessment/Plan: 1. Functional deficits secondary to Left CR, Lentiform nucleus, temporal infarct which require 3+ hours per day of interdisciplinary therapy in a comprehensive inpatient rehab setting. Physiatrist is providing  close team supervision and 24 hour management of active medical problems listed below. Physiatrist and rehab team continue to assess barriers to discharge/monitor patient progress toward functional and medical goals.  FIM: FIM - Bathing Bathing Steps Patient Completed: Chest;Right Arm;Abdomen;Left Arm;Front perineal area;Buttocks;Right upper leg;Left upper leg;Right lower leg (including foot);Left lower leg (including foot) Bathing: 4: Steadying assist  FIM - Upper Body Dressing/Undressing Upper body dressing/undressing steps patient completed: Thread/unthread left sleeve of pullover shirt/dress;Put head through opening of pull over shirt/dress;Pull shirt over trunk Upper body dressing/undressing: 4: Min-Patient completed 75 plus % of tasks FIM - Lower Body Dressing/Undressing Lower body dressing/undressing steps patient completed: Thread/unthread left pants leg;Thread/unthread right pants leg;Don/Doff left shoe;Thread/unthread left underwear leg Lower body dressing/undressing: 3: Mod-Patient completed 50-74% of tasks  FIM - Hotel manager Devices: Grab bar or rail for support Toileting: 0: Activity did not occur  FIM - Diplomatic Services operational officer Devices: Grab bars Toilet Transfers: 3-To toilet/BSC: Mod A (lift or lower assist);3-From toilet/BSC: Mod A (lift or lower assist)  FIM - Bed/Chair Transfer Bed/Chair Transfer Assistive Devices: Arm rests;Bed rails Bed/Chair Transfer: 3: Chair or W/C > Bed: Mod A (lift or lower assist);3: Bed > Chair or W/C: Mod A (lift or lower assist)  FIM - Locomotion: Wheelchair Distance: 120 Locomotion: Wheelchair: 2: Travels 50 - 149 ft with supervision, cueing or coaxing FIM - Locomotion: Ambulation Locomotion: Ambulation Assistive Devices: Walker - Rolling;Orthosis;Other (comment) (R AFO, R hand orthosis) Ambulation/Gait Assistance: 2: Max assist Locomotion: Ambulation: 1: Travels less than 50 ft with maximal  assistance (Pt: 25 - 49%)  Comprehension Comprehension Mode: Auditory Comprehension: 5-Understands basic 90% of the time/requires cueing < 10% of the time  Expression Expression Mode: Verbal Expression: 3-Expresses basic 50 - 74% of the time/requires cueing 25 - 50% of the time. Needs to repeat parts of sentences.  Social Interaction Social Interaction: 5-Interacts appropriately 90% of the time - Needs monitoring or encouragement for participation or interaction.  Problem Solving Problem Solving: 4-Solves basic 75 - 89% of the time/requires cueing 10 - 24% of the time  Memory Memory: 3-Recognizes or recalls 50 - 74% of the time/requires cueing 25 - 49% of the time   Medical Problem List and Plan:  1. Functional deficits secondary to embolic infarcts left corona radiata, left lentiform nucleus, left temporal lobe with R Hemiparesis and aphasia  2. DVT Prophylaxis/Anticoagulation: Pharmaceutical: Lovenox  3. Pain Management: Tylenol 650 mg every 4 hours when necessary  4. Mood: Monitor for post stroke depression , flat affect, trial celexa 5. Neuropsych: This patient is not capable of making decisions on his own behalf.  6. HTN: 7. DM type 2: controlled on  amaryl , monitor for hypoglycemia 8. Seizure disorder:  9.  Acute renal insufficiency, suspect ACE-I, HCTZ  Resolved on lower doses10.  Bowel inc, start bowel program, senna qhs , dulc qam LOS (Days) 13 A FACE TO FACE EVALUATION WAS PERFORMED  KIRSTEINS,ANDREW E 12/30/2013, 6:45 AM

## 2013-12-30 NOTE — Progress Notes (Signed)
Occupational Therapy Session Note  Patient Details  Name: [name] MRN: [id number] Date of Birth: [birth year]  Today's Date: 12/30/2013 Time: 1400-1445 Time Calculation (min): 45 min  Short Term Goals: Week 2:  OT Short Term Goal 1 (Week 2): Patient will transfer to toilet with min assist OT Short Term Goal 1 - Progress (Week 2): Not met OT Short Term Goal 2 (Week 2): Patient will dress upper body with min assist  OT Short Term Goal 2 - Progress (Week 2): Met OT Short Term Goal 3 (Week 2): Patient will dress lower body with min assist OT Short Term Goal 3 - Progress (Week 2): Not met OT Short Term Goal 4 (Week 2): Patient will transfer to tub bench with mod assist OT Short Term Goal 4 - Progress (Week 2): Met OT Short Term Goal 5 (Week 2): Patient will bathe self with min assist OT Short Term Goal 5 - Progress (Week 2): Met Week 3:  OT Short Term Goal 1 (Week 3): Patient will transfer to toilet with min assist OT Short Term Goal 2 (Week 3): Patient will transfer to shower with min assist OT Short Term Goal 3 (Week 3): Patient will dress lower body with min assist  Skilled Therapeutic Interventions/Progress Updates:    Patient seen this pm for OT intervention to address functional transfers, with and without walker with hand splint.  Patient with best performance with walker and stand step transfers.  Patient with improved postural control when UE support available.    Therapy Documentation Precautions:  Precautions Precautions: Fall Restrictions Weight Bearing Restrictions: No   Pain: Pain Assessment Pain Assessment: No/denies pain  See FIM for current functional status  Therapy/Group: Individual Therapy  Mariah Milling 12/30/2013, 5:38 PM

## 2013-12-30 NOTE — Progress Notes (Addendum)
Physical Therapy Session Note  Patient Details  Name: [name] MRN: [id number] Date of Birth: 12/04/1970  Today's Date: 12/30/2013 Time:  0917-1010 Time Calculation (min): 53 min  Short Term Goals: Week 2:  PT Short Term Goal 1 (Week 2): Pt will perform bed<>chair transfer with min A. PT Short Term Goal 2 (Week 2): Pt will perform gait x50' in controlled environment using LRAD with min A x1. PT Short Term Goal 3 (Week 2): Pt will perform w/c mobility x150' in controlled environment with supervision. PT Short Term Goal 4 (Week 2): Pt will perform dynamic standing balance x1 minute with min A.  Skilled Therapeutic Interventions/Progress Updates:    Pt received seated in w/c with quick release belt on; agreeable to therapy. Pt wearing R AFO (Blue Rocker) and R heel lift. Skilled co-tx with rec therapist focusing on increasing RLE attention, proprioception to control genu recurvatum during gait. See below for detailed description of NMR interventions addressing said goals. Pt performed w/c mobility x150' in controlled environment with bilat LE's requiring supervision, increased time. To promote consistency of assist with transfers across staff, this PT explained and demonstrated for nurse tech how to safely assist/cue pt during stand pivot transfer with rolling walker. Nurse tech gave effective return demonstration of safe assist and cueing with transfer. Pt required mod A and cueing for safe hand placement, anterior weight shift with sit<>stand, lateral weight shift with pivot. PT session ended in treatment gym, where pt was left seated in w/c accompanied by rec therapist, who transported pt to room post-session. Addendum: Post-session, PT assisted orthotist in assessing pt gait for custom R ankle-foot orthotic. During assessment, pt performed gait x10' in controlled environment with mod A using rolling walker, R hand orthosis, R AFO (Blue Rocker), and heel lift in R shoe.  Therapy  Documentation Precautions:  Precautions Precautions: Fall Restrictions Weight Bearing Restrictions: No Vital Signs: Therapy Vitals Pulse Rate: 64 BP: 136/84 mmHg Pain: Pain Assessment Pain Assessment: No/denies pain NMR:   While standing in front of mat table with bilat UE support at rolling walker, mirror positioned on pt's R side for visual feedback of R knee position, pt performed  squats with tactile cueing at R distal quadriceps, verbal cueing focused on controlling terminal knee extension. Transitioned to standing with RUE support at high-low table (manual stabilization provided for position, RUE weight bearing), using LUE to play checkers with rec therapist. Activity focused on weight shifting (anterior, lateral, and across midline) with PT providing manual stabilization of R knee in extension while preventing recurvatum.  See FIM for current functional status  Therapy/Group: Co-Treatment  Hobble, Blair A 12/30/2013, 10:07 PM

## 2013-12-30 NOTE — Progress Notes (Signed)
Recreational Therapy Session Note  Patient Details  Name: ZIDAN MACKOWIAK MRN: [id number] Date of Birth: [birth year] Today's Date: 12/30/2013  Pain: no c/o Skilled Therapeutic Interventions/Progress Updates: Session focused on dynamic sitting balance, selective attention, verbalization during tabletop game.  Pt sat EOC to play checkers reaching outside BOS with supervision. Pt attempted to use RUE during task, but required hand over hand using LUE to make move. Pt required one cue during game for rules of play.  Pt more engaged & initiating conversation during this session.    SIMPSON,LISA 12/30/2013, 11:16 AM

## 2013-12-31 ENCOUNTER — Encounter (HOSPITAL_COMMUNITY): Payer: Medicare Other | Admitting: Occupational Therapy

## 2013-12-31 ENCOUNTER — Inpatient Hospital Stay (HOSPITAL_COMMUNITY): Payer: Medicare Other | Admitting: Physical Therapy

## 2013-12-31 ENCOUNTER — Encounter: Payer: Self-pay | Admitting: Internal Medicine

## 2013-12-31 ENCOUNTER — Inpatient Hospital Stay (HOSPITAL_COMMUNITY): Payer: Medicare Other | Admitting: Speech Pathology

## 2013-12-31 DIAGNOSIS — G8110 Spastic hemiplegia affecting unspecified side: Secondary | ICD-10-CM

## 2013-12-31 DIAGNOSIS — I6330 Cerebral infarction due to thrombosis of unspecified cerebral artery: Secondary | ICD-10-CM

## 2013-12-31 LAB — GLUCOSE, CAPILLARY
GLUCOSE-CAPILLARY: 127 mg/dL — AB (ref 70–99)
GLUCOSE-CAPILLARY: 128 mg/dL — AB (ref 70–99)
Glucose-Capillary: 97 mg/dL (ref 70–99)
Glucose-Capillary: 103 mg/dL — ABNORMAL HIGH (ref 70–99)

## 2013-12-31 NOTE — Progress Notes (Signed)
Occupational Therapy Session Note  Patient Details  Name: [name] MRN: [id number] Date of Birth: 23-Dec-1970  Today's Date: 12/31/2013 Time: 8115-7262 Time Calculation (min): 55 min  Short Term Goals: Week 1:  OT Short Term Goal 1 (Week 1): Pt will complete toilet transfer at mod assist OT Short Term Goal 1 - Progress (Week 1): Met OT Short Term Goal 2 (Week 1): Pt will complete 1 grooming task in standing with mod assist OT Short Term Goal 2 - Progress (Week 1): Met OT Short Term Goal 3 (Week 1): Pt will initiate use of RUE during self-care tasks 25% of time OT Short Term Goal 3 - Progress (Week 1): Not met OT Short Term Goal 4 (Week 1): Pt will complete LB dressing with max assist OT Short Term Goal 4 - Progress (Week 1): Met  Week 2:  OT Short Term Goal 1 (Week 2): Patient will transfer to toilet with min assist OT Short Term Goal 1 - Progress (Week 2): Not met OT Short Term Goal 2 (Week 2): Patient will dress upper body with min assist  OT Short Term Goal 2 - Progress (Week 2): Met OT Short Term Goal 3 (Week 2): Patient will dress lower body with min assist OT Short Term Goal 3 - Progress (Week 2): Not met OT Short Term Goal 4 (Week 2): Patient will transfer to tub bench with mod assist OT Short Term Goal 4 - Progress (Week 2): Met OT Short Term Goal 5 (Week 2): Patient will bathe self with min assist OT Short Term Goal 5 - Progress (Week 2): Met  Week 3:  OT Short Term Goal 1 (Week 3): Patient will transfer to toilet with min assist OT Short Term Goal 2 (Week 3): Patient will transfer to shower with min assist OT Short Term Goal 3 (Week 3): Patient will dress lower body with min assist  Skilled Therapeutic Interventions/Progress Updates:  Patient received supine in bed. Therapist engaged patient in conversation, focusing on expressive aphasia. Patient engaged in bed mobility with close supervision, then transferred EOB>w/c with moderate assistance (squat pivot technique).  Patient stated he preferred to clean up at sink today. Patient propelled self to sink and engaged in UB/LB bathing & dressing tasks. Patient required min assist for UB bathing (requiring assistance for LUE) and supervision for LB bathing while seated; no peri cleansing or buttock cleaning. Patient stated he previously cleaned self secondary to using bathroom earlier this am. Therapist assisted with donning bilateral TEDs and bilateral shoes with AFO>RLE. Patient stood with moderate assistance to pull pants up to waist. During session, focused skilled intervention on ADL retraining using compensatory strategies as needed in order to increase independence, functional transfers, sit<>stands, dynamic standing balance/tolerance/endurance, overall activity tolerance/endurance, problem solving, initiation, expressive aphasia, and memory . At end of session, left patient seated in w/c with all needs within reach and quick release belt donned.   Precautions:  Precautions Precautions: Fall Restrictions Weight Bearing Restrictions: No  See FIM for current functional status  Therapy/Group: Individual Therapy  CLAY,PATRICIA 12/31/2013, 10:14 AM

## 2013-12-31 NOTE — Progress Notes (Signed)
Speech Language Pathology Weekly Progress and Session Note  Patient Details  Name: [name] MRN: [id number] Date of Birth: 1970-08-25  Beginning of progress report period: December 24, 2013 End of progress report period: December 31, 2013  Today's Date: 12/31/2013 Time: 1320-1400 Time Calculation (min): 40 min  Short Term Goals: Week 2: SLP Short Term Goal 1 (Week 2): Pt will utilize swallowing compensatory strategies with Mod I to minimize overt s/s of aspiration.  SLP Short Term Goal 1 - Progress (Week 2): Met SLP Short Term Goal 2 (Week 2): Pt will demonstrate effective mastication of regular textures with Mod I without overt s/s of aspiration.  SLP Short Term Goal 2 - Progress (Week 2): Progressing toward goal SLP Short Term Goal 3 (Week 2): Pt will attend to the right field of environment during functional tasks with supervision cues.  SLP Short Term Goal 3 - Progress (Week 2): Met SLP Short Term Goal 4 (Week 2): Pt will demonstrate functional problem solving for basic and familiar tasks with supervision mulimodal cues.  SLP Short Term Goal 4 - Progress (Week 2): Progressing toward goal SLP Short Term Goal 5 (Week 2): Pt will self-monitor and correct verbal errors with min  A multimodal cues.  SLP Short Term Goal 5 - Progress (Week 2): Met    New Short Term Goals: Week 3: SLP Short Term Goal 1 (Week 3): Pt will demonstrate effective mastication of regular textures with Mod I without overt s/s of aspiration.  SLP Short Term Goal 2 (Week 3): Pt will attend to the right field of environment during functional tasks with modified independence.  SLP Short Term Goal 3 (Week 3): Pt will self-monitor and correct verbal errors with supervision multimodal cues.  SLP Short Term Goal 4 (Week 3): Pt will demonstrate functional problem solving for basic and familiar tasks with supervision mulimodal cues.  SLP Short Term Goal 5 (Week 3): Pt will improve will recall of daily events and information for  80% accuracy with min assist cuing  Weekly Progress Updates:  Pt made functional progress this reporting period and met 3 out of 5 short term goals due to improved awareness of verbal errors, right attention, and use of swallowing precautions.  Pt currently requires min-mod assist for cognitive-linguistic tasks and intermittent supervision for use of swallowing precautions during meals.  Pt's use of compensatory strategies for functional word finding.  Pt would continue to benefit from skilled speech therapy while inpatient targeting improved cognitive-linguistic function and swallowing safety.     Intensity: Minumum of 1-2 x/day, 30 to 90 minutes Frequency: 5 out of 7 days Duration/Length of Stay: 17-21 days Treatment/Interventions: Cueing hierarchy;Cognitive remediation/compensation;Internal/external aids;Environmental controls;Speech/Language facilitation;Therapeutic Activities;Patient/family education;Functional tasks;Dysphagia/aspiration precaution training;Therapeutic Exercise   Daily Session  Skilled Therapeutic Interventions: Pt was seen for skilled speech therapy targeting expressive language and executive function. Pt benefited from increased processing time and min assist verbal cuing to correct paraphasic errors during structured picture description tasks.  Pt was noted to generate short, phrase length utterances with breakdown of syntactical structures noted at the sentence level which were corrected with min-mod verbal cuing.  Pt was 80% independent for 4 step sequencing, improving to 100% accuracy with min assist cuing.   Goals updated to reflect current progress and plan of care.         FIM:  Comprehension Comprehension Mode: Auditory Comprehension: 5-Understands basic 90% of the time/requires cueing < 10% of the time Expression Expression Mode: Verbal Expression: 3-Expresses basic 50 -  74% of the time/requires cueing 25 - 50% of the time. Needs to repeat parts of  sentences. Social Interaction Social Interaction: 5-Interacts appropriately 90% of the time - Needs monitoring or encouragement for participation or interaction. Problem Solving Problem Solving: 4-Solves basic 75 - 89% of the time/requires cueing 10 - 24% of the time Memory Memory: 3-Recognizes or recalls 50 - 74% of the time/requires cueing 25 - 49% of the time FIM - Eating Eating Activity: 6: Swallowing techniques: self-managed General  Amount of Missed SLP Time (min): 5 Minutes Pain Pain Assessment Pain Assessment: No/denies pain  Therapy/Group: Individual Therapy  Windell Moulding, M.A. CCC-SLP  Page, Selinda Orion 12/31/2013, 4:27 PM

## 2013-12-31 NOTE — Plan of Care (Signed)
Problem: RH PAIN MANAGEMENT Goal: RH STG PAIN MANAGED AT OR BELOW PT'S PAIN GOAL 3 or less on scale of 1-10  Outcome: Progressing No c/o pain     

## 2013-12-31 NOTE — Progress Notes (Signed)
Physical Therapy Session Note  Patient Details  Name: [name] MRN: [id number] Date of Birth: [birth year]  Today's Date: 12/31/2013 Time: 6825-7493 Time Calculation (min): 30 min  Short Term Goals: Week 1:  PT Short Term Goal 1 (Week 1): Pt will perform bed mobility with supervision. PT Short Term Goal 1 - Progress (Week 1): Met PT Short Term Goal 2 (Week 1): Pt will perform bed<>w/c transfers with mod A. PT Short Term Goal 2 - Progress (Week 1): Met PT Short Term Goal 3 (Week 1): Pt will maintain static standing balance x 1 min with min A.  PT Short Term Goal 3 - Progress (Week 1): Met PT Short Term Goal 4 (Week 1): Pt will perform gait x 25 ft using LRAD with mod A x 1.  PT Short Term Goal 4 - Progress (Week 1): Met  Skilled Therapeutic Interventions/Progress Updates:    Pt received seated in w/c, agreeable to participate in therapy. Session focused on functional ambulation, increased weight shifting to RLE, and NMR on RLE for increased quad control. Pt agreeable to participate in gait training with use of MaxiSky. Pt performed SPT w/c > mat w/ ModA w/ therapist, req. Close supervision for seated balance on mat w/ BLE supported. Pt with minA for leaning L and R to assist with donning bariatric MaxiSky harness. Pt able to ambulate w/ MaxiSky 15' w/ MaxA +2. Pt required assist for placement of R foot during initial contact, modA to weight shift to R/L during gait, and max tactile cues on R quad during stance phase. Pt also exhibited posterior lean and required max VC's to correct. Pt able to correct lean without physical assist until fatigued at end of session. After 15' walk, pt attempted to walk backwards towards w/c but was unable to do so secondary to posterior lean. Brought w/c to pt and lowered MaxiSky. Wheeled pt back to room. Pt left with all needs within reach.   Therapy Documentation Precautions:  Precautions Precautions: Fall Restrictions Weight Bearing Restrictions:  No Pain: Pain Assessment Pain Assessment: No/denies pain Pain Score: 0-No pain  See FIM for current functional status  Therapy/Group: Individual Therapy  Rada Hay Rada Hay, PT, DPT  12/31/2013, 3:47 PM

## 2013-12-31 NOTE — Progress Notes (Signed)
Physical Therapy Session Note  Patient Details  Name: [name] MRN: [id number] Date of Birth: January 01, 1971  Today's Date: 12/31/2013 Time: 1100-1200 Time Calculation (min): 60 min  Short Term Goals: Week 2:  PT Short Term Goal 1 (Week 2): Pt will perform bed<>chair transfer with min A. PT Short Term Goal 2 (Week 2): Pt will perform gait x50' in controlled environment using LRAD with min A x1. PT Short Term Goal 3 (Week 2): Pt will perform w/c mobility x150' in controlled environment with supervision. PT Short Term Goal 4 (Week 2): Pt will perform dynamic standing balance x1 minute with min A.  Skilled Therapeutic Interventions/Progress Updates:   AM Session: Pt received seated in w/c with quick release belt on, agreeable to therapy. Pt wearing R AFO/R heel lift. Session focused on basic transfers and gait using RW. Pt performed w/c mobility 2 x 150' in controlled environment with bilat LE's requiring supervision, max vc's for sequencing. Stand pivot transfer from w/c<>mat table with min-mod A, and manual facilitation of L weight shift. Pt performed sit <> stand with emphasis on control during descent and RLE activation with weightshift to decrease R knee hyperextension in stance, 2 x 5 with S-min A. Gait training with RW and R hand orthosis in controlled environment x 5 ft and x 10 ft with max A and manual facilitation of weight shift to L, forward rotation of pelvis on R to initiate RLE advancement. Added R shoe cover for improved RLE foot clearance during swing phase. Pt with report of lightheadedness/dizziness after gait; BP 122/78 in sitting. Pt reporting he is "a little confused," stating that yesterday he was less dependent on therapist for gait. Provided emotional support and education provided regarding fluctuation in therapies to be expected. Pt returned to room and left seated in w/c with QRB on and set up lunch tray for patient, all needs within reach.   Therapy  Documentation Precautions:  Precautions Precautions: Fall Restrictions Weight Bearing Restrictions: No Pain:  Denied pain Locomotion : Ambulation Ambulation/Gait Assistance: 2: Max Lawyer Distance: 150   See FIM for current functional status  Therapy/Group: Individual Therapy  Kerney Elbe 12/31/2013, 12:23 PM

## 2013-12-31 NOTE — Progress Notes (Addendum)
Subjective/Complaints: 43 year old male with history of DM type 2, seizure disorder, HTN, prior CVA who was admitted to [facility] on 12/14/13 with weakness X 2 days and difficulty talking. Patient had been out of BP medications X 2 1/2 months. CT head with stable hydrocephalus. MRI brain done revealing acute infarct left corona radiata, lentiform nuclei, left posterior temporal lobe and chronic severe ventriculomegaly/hydrocephalus  No bowel or bladder issues, no breathing problem, no pain c/os  Review of Systems - unable to obtain secondary to aphasia  Objective: Vital Signs: Blood pressure 145/90, pulse 62, temperature 97.7 F (36.5 C), temperature source Oral, resp. rate 18, weight 121.6 kg (268 lb 1.3 oz), SpO2 99.00%. No results found. Results for orders placed during the hospital encounter of 12/17/13 (from the past 72 hour(s))  GLUCOSE, CAPILLARY     Status: Abnormal   Collection Time    12/28/13  7:04 AM      Result Value Ref Range   Glucose-Capillary 125 (*) 70 - 99 mg/dL  GLUCOSE, CAPILLARY     Status: Abnormal   Collection Time    12/28/13 11:10 AM      Result Value Ref Range   Glucose-Capillary 146 (*) 70 - 99 mg/dL  GLUCOSE, CAPILLARY     Status: Abnormal   Collection Time    12/28/13  4:23 PM      Result Value Ref Range   Glucose-Capillary 194 (*) 70 - 99 mg/dL  GLUCOSE, CAPILLARY     Status: Abnormal   Collection Time    12/28/13  9:25 PM      Result Value Ref Range   Glucose-Capillary 151 (*) 70 - 99 mg/dL  GLUCOSE, CAPILLARY     Status: Abnormal   Collection Time    12/29/13  7:15 AM      Result Value Ref Range   Glucose-Capillary 122 (*) 70 - 99 mg/dL  GLUCOSE, CAPILLARY     Status: Abnormal   Collection Time    12/29/13 12:16 PM      Result Value Ref Range   Glucose-Capillary 132 (*) 70 - 99 mg/dL   Comment 1 Notify RN    GLUCOSE, CAPILLARY     Status: None   Collection Time    12/29/13  4:44 PM      Result Value Ref Range   Glucose-Capillary 92  70 - 99  mg/dL  GLUCOSE, CAPILLARY     Status: Abnormal   Collection Time    12/29/13  9:10 PM      Result Value Ref Range   Glucose-Capillary 124 (*) 70 - 99 mg/dL  GLUCOSE, CAPILLARY     Status: Abnormal   Collection Time    12/30/13  7:22 AM      Result Value Ref Range   Glucose-Capillary 108 (*) 70 - 99 mg/dL   Comment 1 Notify RN    GLUCOSE, CAPILLARY     Status: Abnormal   Collection Time    12/30/13 11:14 AM      Result Value Ref Range   Glucose-Capillary 157 (*) 70 - 99 mg/dL  GLUCOSE, CAPILLARY     Status: Abnormal   Collection Time    12/30/13  4:16 PM      Result Value Ref Range   Glucose-Capillary 130 (*) 70 - 99 mg/dL  GLUCOSE, CAPILLARY     Status: Abnormal   Collection Time    12/30/13  9:10 PM      Result Value Ref Range   Glucose-Capillary  141 (*) 70 - 99 mg/dL      Mood and affect are appropriate  Heart: Regular rate and rhythm no rubs murmurs or extra sounds  Lungs: Clear to auscultation, breathing unlabored, no rales or wheezes  Abdomen: Positive bowel sounds, soft nontender to palpation, nondistended  Extremities: No clubbing, cyanosis, or edema  Skin: No evidence of breakdown except small area on L forearm, no evidence of rash  Neurologic: Cranial nerves II through XII intact, motor strength is 5/5 in Left deltoid, bicep, tricep, grip, hip flexor, knee extensors, ankle dorsiflexor and plantar flexor  3 minus/5 in the left biceps triceps and deltoid 4 minus at grip, 4 minus right hip flexor knee extensor ankle dorsiflexor plantar flexor  Decreased sensation in Right hand Speech reduced verbal output, able to follow basic commands Oriented to person, place ([place]) and Month,   Assessment/Plan: 1. Functional deficits secondary to Left CR, Lentiform nucleus, temporal infarct which require 3+ hours per day of interdisciplinary therapy in a comprehensive inpatient rehab setting. Physiatrist is providing close team supervision and 24 hour management of active  medical problems listed below. Physiatrist and rehab team continue to assess barriers to discharge/monitor patient progress toward functional and medical goals.  FIM: FIM - Bathing Bathing Steps Patient Completed: Chest;Right Arm;Abdomen;Left Arm;Front perineal area;Buttocks;Right upper leg;Left upper leg;Right lower leg (including foot);Left lower leg (including foot) Bathing: 4: Steadying assist  FIM - Upper Body Dressing/Undressing Upper body dressing/undressing steps patient completed: Thread/unthread left sleeve of pullover shirt/dress;Put head through opening of pull over shirt/dress;Pull shirt over trunk Upper body dressing/undressing: 4: Min-Patient completed 75 plus % of tasks FIM - Lower Body Dressing/Undressing Lower body dressing/undressing steps patient completed: Thread/unthread left pants leg;Thread/unthread right pants leg;Don/Doff left shoe;Thread/unthread right underwear leg;Thread/unthread left underwear leg Lower body dressing/undressing: 3: Mod-Patient completed 50-74% of tasks  FIM - Toileting Toileting steps completed by patient: Performs perineal hygiene Toileting Assistive Devices: Grab bar or rail for support Toileting: 2: Max-Patient completed 1 of 3 steps  FIM - Diplomatic Services operational officerToilet Transfers Toilet Transfers Assistive Devices: Grab bars Toilet Transfers: 3-To toilet/BSC: Mod A (lift or lower assist);3-From toilet/BSC: Mod A (lift or lower assist)  FIM - Bed/Chair Transfer Bed/Chair Transfer Assistive Devices: Arm rests;Orthosis;Walker Bed/Chair Transfer: 3: Chair or W/C > Bed: Mod A (lift or lower assist);3: Bed > Chair or W/C: Mod A (lift or lower assist)  FIM - Locomotion: Wheelchair Distance: 150 Locomotion: Wheelchair: 5: Travels 150 ft or more: maneuvers on rugs and over door sills with supervision, cueing or coaxing FIM - Locomotion: Ambulation Locomotion: Ambulation Assistive Devices: Walker - Rolling;Orthosis;Other (comment) (R AFO, R hand  orthosis) Ambulation/Gait Assistance: 3: Mod assist Locomotion: Ambulation: 1: Travels less than 50 ft with moderate assistance (Pt: 50 - 74%)  Comprehension Comprehension Mode: Auditory Comprehension: 5-Follows basic conversation/direction: With no assist  Expression Expression Mode: Verbal Expression: 3-Expresses basic 50 - 74% of the time/requires cueing 25 - 50% of the time. Needs to repeat parts of sentences.  Social Interaction Social Interaction: 5-Interacts appropriately 90% of the time - Needs monitoring or encouragement for participation or interaction.  Problem Solving Problem Solving: 4-Solves basic 75 - 89% of the time/requires cueing 10 - 24% of the time  Memory Memory: 3-Recognizes or recalls 50 - 74% of the time/requires cueing 25 - 49% of the time   Medical Problem List and Plan:  1. Functional deficits secondary to embolic infarcts left corona radiata, left lentiform nucleus, left temporal lobe with R Hemiparesis and aphasia  2. DVT Prophylaxis/Anticoagulation: Pharmaceutical: Lovenox  3. Pain Management: Tylenol 650 mg every 4 hours when necessary  4. Mood: Monitor for post stroke depression , flat affect, trial celexa 5. Neuropsych: This patient is not capable of making decisions on his own behalf.  6. HTN: 7. DM type 2: controlled on  amaryl , monitor for hypoglycemia 8. Seizure disorder:  9.  Acute renal insufficiency, suspect ACE-I, HCTZ  Resolved on lower doses  Bowel inc, start bowel program, senna qhs , dulc qam LOS (Days) 14 A FACE TO FACE EVALUATION WAS PERFORMED  Lanina Larranaga E 12/31/2013, 6:15 AM

## 2014-01-01 ENCOUNTER — Inpatient Hospital Stay (HOSPITAL_COMMUNITY): Payer: Medicare Other | Admitting: Physical Therapy

## 2014-01-01 DIAGNOSIS — I10 Essential (primary) hypertension: Secondary | ICD-10-CM | POA: Diagnosis not present

## 2014-01-01 DIAGNOSIS — E1149 Type 2 diabetes mellitus with other diabetic neurological complication: Secondary | ICD-10-CM | POA: Diagnosis not present

## 2014-01-01 DIAGNOSIS — I6350 Cerebral infarction due to unspecified occlusion or stenosis of unspecified cerebral artery: Secondary | ICD-10-CM | POA: Diagnosis not present

## 2014-01-01 LAB — GLUCOSE, CAPILLARY
Glucose-Capillary: 95 mg/dL (ref 70–99)
Glucose-Capillary: 106 mg/dL — ABNORMAL HIGH (ref 70–99)
Glucose-Capillary: 77 mg/dL (ref 70–99)
Glucose-Capillary: 130 mg/dL — ABNORMAL HIGH (ref 70–99)

## 2014-01-01 NOTE — Progress Notes (Signed)
[name] is a 43 y.o. male [birth year] [id number]  Subjective: No new complaints. No new problems. Slept well. Feeling OK.  Objective: Vital signs in last 24 hours: Temp:  [97.6 F (36.4 C)-97.9 F (36.6 C)] 97.6 F (36.4 C) (06/27 0500) Pulse Rate:  [63-70] 65 (06/27 0500) Resp:  [18] 18 (06/27 0500) BP: (137-145)/(82-90) 145/86 mmHg (06/27 0500) SpO2:  [100 %] 100 % (06/27 0500) Weight change:  Last BM Date: 12/30/13  Intake/Output from previous day: 06/26 0701 - 06/27 0700 In: 720 [P.O.:720] Out: 350 [Urine:350] Last cbgs: CBG (last 3)   Recent Labs  12/31/13 2047 01/01/14 0726 01/01/14 1151  GLUCAP 103* 95 106*     Physical Exam General: No apparent distress   HEENT: not dry Lungs: Normal effort. Lungs clear to auscultation, no crackles or wheezes. Cardiovascular: Regular rate and rhythm, no edema Abdomen: S/NT/ND; BS(+) Musculoskeletal:  unchanged Neurological: No new neurological deficits Wounds: N/A    Skin: clear   Mental state: Alert, oriented, cooperative    Lab Results: BMET    Component Value Date/Time   NA 136* 12/24/2013 0619   K 4.6 12/24/2013 0619   CL 98 12/24/2013 0619   CO2 25 12/24/2013 0619   GLUCOSE 158* 12/24/2013 0619   BUN 21 12/24/2013 0619   CREATININE 1.23 12/24/2013 0619   CALCIUM 9.5 12/24/2013 0619   GFRNONAA 71* 12/24/2013 0619   GFRAA 83* 12/24/2013 0619   CBC    Component Value Date/Time   WBC 4.3 12/20/2013 0750   RBC 4.74 12/20/2013 0750   HGB 13.8 12/20/2013 0750   HCT 42.5 12/20/2013 0750   PLT 181 12/20/2013 0750   MCV 89.7 12/20/2013 0750   MCH 29.1 12/20/2013 0750   MCHC 32.5 12/20/2013 0750   RDW 13.6 12/20/2013 0750   LYMPHSABS 1.4 12/20/2013 0750   MONOABS 0.7 12/20/2013 0750   EOSABS 0.3 12/20/2013 0750   BASOSABS 0.0 12/20/2013 0750    Studies/Results: No results found.  Medications: I have reviewed the patient's current medications.  Assessment/Plan:  1. Functional deficits secondary to embolic  infarcts left corona radiata, left lentiform nucleus, left temporal lobe with R Hemiparesis and aphasia  2. DVT Prophylaxis/Anticoagulation: Pharmaceutical: Lovenox  3. Pain Management: Tylenol 650 mg every 4 hours when necessary  4. Mood: Monitor for post stroke depression , flat affect, on celexa  5. Neuropsych: This patient is not capable of making decisions on his own behalf.  6. HTN: 7. DM type 2: controlled on amaryl , monitor for hypoglycemia  8. Seizure disorder:  9. Acute renal insufficiency, suspect ACE-I, HCTZ Resolved on lower doses Bowel inc, start bowel program, senna qhs , dulc qam  LOS (Days) 14     Length of stay, days: 15  Sonda Primes , MD 01/01/2014, 3:10 PM

## 2014-01-01 NOTE — Progress Notes (Addendum)
Physical Therapy Session Note  Patient Details  Name: [name] MRN: [id number] Date of Birth: 1970/10/04  Today's Date: 01/01/2014 Time: 1018-1100 Time Calculation (min): 42 min  Short Term Goals: Week 2:  PT Short Term Goal 1 (Week 2): Pt will perform bed<>chair transfer with min A. PT Short Term Goal 2 (Week 2): Pt will perform gait x50' in controlled environment using LRAD with min A x1. PT Short Term Goal 3 (Week 2): Pt will perform w/c mobility x150' in controlled environment with supervision. PT Short Term Goal 4 (Week 2): Pt will perform dynamic standing balance x1 minute with min A.  Skilled Therapeutic Interventions/Progress Updates:    Pt received semi-reclined in bed; asleep but easily aroused. Agreeable to therapy. Session focused on increasing pt independence with stand pivot transfers. Performed supine>sit with supervision, increased time with HOB flat, no rails. Seated EOB, pt required supervision for dynamic standing balance x3 minutes. Donned Teds, shoes, and R AFO to control R genu recurvatum. Pt performed multiple sit>stand transfers from EOB requiring min A, manual facilitation of anterior weight shift with bed height neutral; required close supervision to min guard for sit>stand with bed height elevated. With stand>sit, pt consistently required min A, manual facilitation of anterior weight shifting, and subtle to min cueing for safe hand placement. Performed stand pivot transfers x2 (from bed>w/c>recliner) requiring min guard for sit>stand, min A during pivoting (to control grading of lateral weight shifting), and min A for stand>sit with manual facilitating/cueing as described above. Doffed R AFO. Therapist departed with pt seated in recliner with quick release belt on for safety, bilat LE's elevated, and all needs within reach.   Therapy Documentation Precautions:  Precautions Precautions: Fall Restrictions Weight Bearing Restrictions: No Pain: Pain  Assessment Pain Assessment: No/denies pain  See FIM for current functional status  Therapy/Group: Individual Therapy  Hobble, Lorenda Ishihara 01/01/2014, 1:23 PM

## 2014-01-02 ENCOUNTER — Inpatient Hospital Stay (HOSPITAL_COMMUNITY): Payer: Medicare Other | Admitting: *Deleted

## 2014-01-02 DIAGNOSIS — I10 Essential (primary) hypertension: Secondary | ICD-10-CM | POA: Diagnosis not present

## 2014-01-02 DIAGNOSIS — E1351 Other specified diabetes mellitus with diabetic peripheral angiopathy without gangrene: Secondary | ICD-10-CM

## 2014-01-02 DIAGNOSIS — I6350 Cerebral infarction due to unspecified occlusion or stenosis of unspecified cerebral artery: Secondary | ICD-10-CM | POA: Diagnosis not present

## 2014-01-02 LAB — GLUCOSE, CAPILLARY
GLUCOSE-CAPILLARY: 111 mg/dL — AB (ref 70–99)
GLUCOSE-CAPILLARY: 91 mg/dL (ref 70–99)
Glucose-Capillary: 87 mg/dL (ref 70–99)
Glucose-Capillary: 100 mg/dL — ABNORMAL HIGH (ref 70–99)

## 2014-01-02 NOTE — Plan of Care (Signed)
Problem: RH SAFETY Goal: RH STG ADHERE TO SAFETY PRECAUTIONS W/ASSISTANCE/DEVICE STG Adhere to Safety Precautions With supervision  Outcome: Not Progressing This weekend, removed QRB and returned self to bed

## 2014-01-02 NOTE — Progress Notes (Signed)
Occupational Therapy Session Note  Patient Details  Name: [name] MRN: [id number] Date of Birth: 09/06/1970  Today's Date: 01/02/2014 Time:  -   1430-  1530  (60 min)    Short Term Goals: Week 1:  OT Short Term Goal 1 (Week 1): Pt will complete toilet transfer at mod assist OT Short Term Goal 1 - Progress (Week 1): Met OT Short Term Goal 2 (Week 1): Pt will complete 1 grooming task in standing with mod assist OT Short Term Goal 2 - Progress (Week 1): Met OT Short Term Goal 3 (Week 1): Pt will initiate use of RUE during self-care tasks 25% of time OT Short Term Goal 3 - Progress (Week 1): Not met OT Short Term Goal 4 (Week 1): Pt will complete LB dressing with max assist OT Short Term Goal 4 - Progress (Week 1): Met  Skilled Therapeutic Interventions/Progress Updates:    Addressed RUE NMRE with movements initiated in elbow flexion and extension.  Provided closed chain activities to incorporate proprioceptive feedback.  Pt. Did well with heavy weight bearing in half standing with BUE on bench.  Provided facilitation to triceps and anterior shoulder for increased control.  Did reaching patterns in all planes at shoulder with moderate assistance and guiding in horizontal external planes.   Transferred from sit to stand and stand to sit with max cues for eccentric control.  Transferred back to wc and Left pt in wc with safety belt on and call bell,phone within reach.      Therapy Documentation Precautions:  Precautions Precautions: Fall Restrictions Weight Bearing Restrictions: No    Pain:  none             See FIM for current functional status  Therapy/Group: Individual Therapy  Lisa Roca 01/02/2014, 2:33 PM

## 2014-01-02 NOTE — Progress Notes (Signed)
[name] is a 43 y.o. male [birth year] [id number]  Subjective: No new complaints. Slept well. Feeling OK.  Objective: Vital signs in last 24 hours: Temp:  [97.8 F (36.6 C)-97.9 F (36.6 C)] 97.8 F (36.6 C) (06/28 0555) Pulse Rate:  [63-67] 67 (06/28 0555) Resp:  [18-19] 18 (06/28 0555) BP: (122-140)/(73-90) 127/73 mmHg (06/28 0555) SpO2:  [96 %-99 %] 96 % (06/28 0555) Weight change:  Last BM Date: 12/30/13  Intake/Output from previous day: 06/27 0701 - 06/28 0700 In: 240 [P.O.:240] Out: 1350 [Urine:1350] Last cbgs: CBG (last 3)   Recent Labs  01/01/14 1646 01/01/14 2036 01/02/14 0715  GLUCAP 77 130* 87     Physical Exam General: No apparent distress   HEENT: not dry Lungs: Normal effort. Lungs clear to auscultation, no crackles or wheezes. Cardiovascular: Regular rate and rhythm, no edema Abdomen: S/NT/ND; BS(+) Musculoskeletal:  unchanged Neurological: No new neurological deficits Wounds: N/A    Skin: clear   Mental state: Alert, oriented, cooperative    Lab Results: BMET    Component Value Date/Time   NA 136* 12/24/2013 0619   K 4.6 12/24/2013 0619   CL 98 12/24/2013 0619   CO2 25 12/24/2013 0619   GLUCOSE 158* 12/24/2013 0619   BUN 21 12/24/2013 0619   CREATININE 1.23 12/24/2013 0619   CALCIUM 9.5 12/24/2013 0619   GFRNONAA 71* 12/24/2013 0619   GFRAA 83* 12/24/2013 0619   CBC    Component Value Date/Time   WBC 4.3 12/20/2013 0750   RBC 4.74 12/20/2013 0750   HGB 13.8 12/20/2013 0750   HCT 42.5 12/20/2013 0750   PLT 181 12/20/2013 0750   MCV 89.7 12/20/2013 0750   MCH 29.1 12/20/2013 0750   MCHC 32.5 12/20/2013 0750   RDW 13.6 12/20/2013 0750   LYMPHSABS 1.4 12/20/2013 0750   MONOABS 0.7 12/20/2013 0750   EOSABS 0.3 12/20/2013 0750   BASOSABS 0.0 12/20/2013 0750    Studies/Results: No results found.  Medications: I have reviewed the patient's current medications.  Assessment/Plan:  1. Functional deficits secondary to embolic infarcts left  corona radiata, left lentiform nucleus, left temporal lobe with R Hemiparesis and aphasia  2. DVT Prophylaxis/Anticoagulation: Pharmaceutical: Lovenox  3. Pain Management: Tylenol 650 mg every 4 hours when necessary  4. Mood: Monitor for post stroke depression , flat affect, on celexa  5. Neuropsych: This patient is not capable of making decisions on his own behalf.  6. HTN: BP is ok 7. DM type 2: controlled on amaryl , monitor for hypoglycemia  8. Seizure disorder:  9. Acute renal insufficiency, suspect ACE-I, HCTZ Resolved on lower doses Bowel inc, start bowel program, senna qhs , dulc qam  LOS (Days) 14     Length of stay, days: 16  Sonda Primes , MD 01/02/2014, 8:16 AM

## 2014-01-03 ENCOUNTER — Inpatient Hospital Stay (HOSPITAL_COMMUNITY): Payer: Medicare Other | Admitting: Speech Pathology

## 2014-01-03 ENCOUNTER — Inpatient Hospital Stay (HOSPITAL_COMMUNITY): Payer: Medicare Other | Admitting: Occupational Therapy

## 2014-01-03 ENCOUNTER — Inpatient Hospital Stay (HOSPITAL_COMMUNITY): Payer: Medicare Other | Admitting: Physical Therapy

## 2014-01-03 ENCOUNTER — Encounter (HOSPITAL_COMMUNITY): Payer: Medicare Other | Admitting: Occupational Therapy

## 2014-01-03 DIAGNOSIS — I6330 Cerebral infarction due to thrombosis of unspecified cerebral artery: Secondary | ICD-10-CM

## 2014-01-03 DIAGNOSIS — G8110 Spastic hemiplegia affecting unspecified side: Secondary | ICD-10-CM

## 2014-01-03 LAB — GLUCOSE, CAPILLARY
GLUCOSE-CAPILLARY: 168 mg/dL — AB (ref 70–99)
Glucose-Capillary: 83 mg/dL (ref 70–99)
Glucose-Capillary: 112 mg/dL — ABNORMAL HIGH (ref 70–99)
Glucose-Capillary: 88 mg/dL (ref 70–99)

## 2014-01-03 MED ORDER — INSULIN ASPART 100 UNIT/ML ~~LOC~~ SOLN: 0.0000 [IU] | Freq: Every day | SUBCUTANEOUS | Status: DC

## 2014-01-03 MED ORDER — INSULIN ASPART 100 UNIT/ML ~~LOC~~ SOLN
0.0000 [IU] | Freq: Three times a day (TID) | SUBCUTANEOUS | Status: DC
  Administered 2014-01-03 – 2014-01-04 (×2): 2 [IU] via SUBCUTANEOUS
  Administered 2014-01-05: 1 [IU] via SUBCUTANEOUS
  Administered 2014-01-05 – 2014-01-06 (×2): 2 [IU] via SUBCUTANEOUS
  Administered 2014-01-06: 1 [IU] via SUBCUTANEOUS
  Administered 2014-01-07: 2 [IU] via SUBCUTANEOUS

## 2014-01-03 NOTE — Progress Notes (Signed)
Recreational Therapy Session Note  Patient Details  Name: MAHD CLICK MRN: [id number] Date of Birth: 06/21/1971 Today's Date: 01/03/2014  Pain: no c/o Skilled Therapeutic Interventions/Progress Updates: session focused on activity tolerance, standing tolerance/balance, bilateral knee control, ambulation & stair climbing.  Pt ambulated 3 musketeers style & negotiated 3 stairs with 1 rail +2 for safety.  SIMPSON,LISA 01/03/2014, 12:47 PM

## 2014-01-03 NOTE — Progress Notes (Signed)
Occupational Therapy Session Note  Patient Details  Name: [name] MRN: [id number] Date of Birth: 1971/03/28  Today's Date: 01/03/2014 Time: 1118-1202 Time Calculation (min): 44 min  Short Term Goals: Week 3:  OT Short Term Goal 1 (Week 3): Patient will transfer to toilet with min assist OT Short Term Goal 2 (Week 3): Patient will transfer to shower with min assist OT Short Term Goal 3 (Week 3): Patient will dress lower body with min assist  Skilled Therapeutic Interventions/Progress Updates:    Patient seen this am for skilled OT intervention to address family education and increase independence and safety with basic self care skills.  Patient's brother [name] present for OT session today, and discussed discharge situation - home situation, as in process of moving to new place.  Brother indicates that both potential apartments have tub/shower combination.  Practiced tub shower transfer using stand step transfer with rolling walker and hand splint, and tub transfer bench.  Brother was able to assist patient with lower body dressing effectively.   Therapy Documentation Precautions:  Precautions Precautions: Fall Restrictions Weight Bearing Restrictions: No  Pain:  Denies pain  See FIM for current functional status  Therapy/Group: Individual Therapy  Collier Salina 01/03/2014, 12:05 PM

## 2014-01-03 NOTE — Plan of Care (Signed)
Problem: RH PAIN MANAGEMENT Goal: RH STG PAIN MANAGED AT OR BELOW PT'S PAIN GOAL 3 or less on scale of 1-10  Outcome: Progressing No c/o pain     

## 2014-01-03 NOTE — Progress Notes (Addendum)
Speech Language Pathology Daily Session Note  Patient Details  Name: [name] MRN: [id number] Date of Birth: [birth year]  Today's Date: 01/03/2014 Time: 1030-1110 Time Calculation (min): 40 min  Short Term Goals: Week 3: SLP Short Term Goal 1 (Week 3): Pt will demonstrate effective mastication of regular textures with Mod I without overt s/s of aspiration.  SLP Short Term Goal 2 (Week 3): Pt will attend to the right field of environment during functional tasks with modified independence.  SLP Short Term Goal 3 (Week 3): Pt will self-monitor and correct verbal errors with supervision multimodal cues.  SLP Short Term Goal 4 (Week 3): Pt will demonstrate functional problem solving for basic and familiar tasks with supervision mulimodal cues.  SLP Short Term Goal 5 (Week 3): Pt will improve will recall of daily events and information for 80% accuracy with min assist cuing  Skilled Therapeutic Interventions:  Pt was seen for skilled speech therapy targeting right attention, verbal expression, and memory.  Upon arrival, SLP engaged pt in a functional conversation related to his daily therapy schedule and was able to recall at least 2 details from his previous therapy session with min-mod cuing for memory and use of supported conversation techniques to maximize independence for functional communication.  During structured word finding practice, pt was approximately 80% accurate for divergent naming with supervision cues and approximately 75% accurate for convergent naming with min assist.  In addition, pt initially required min assist for visual scanning and right attention during a structured new learning activity; however SLP faded cuing to supervision as pt became more familiar with the task. Continue per current plan of care.     FIM:  Comprehension Comprehension Mode: Auditory Comprehension: 5-Follows basic conversation/direction: With extra time/assistive device Expression Expression Mode:  Verbal Expression: 4-Expresses basic 75 - 89% of the time/requires cueing 10 - 24% of the time. Needs helper to occlude trach/needs to repeat words. Social Interaction Social Interaction: 5-Interacts appropriately 90% of the time - Needs monitoring or encouragement for participation or interaction. Problem Solving Problem Solving: 4-Solves basic 75 - 89% of the time/requires cueing 10 - 24% of the time Memory Memory: 3-Recognizes or recalls 50 - 74% of the time/requires cueing 25 - 49% of the time  Pain Pain Assessment Pain Assessment: No/denies pain  Therapy/Group: Individual Therapy  [name], M.A. CCC-SLP  [name], [name] 01/03/2014, 2:27 PM

## 2014-01-03 NOTE — Progress Notes (Signed)
Occupational Therapy Session Note  Patient Details  Name: NETTIE FACENDA MRN: [id number] Date of Birth: 05-01-1971  Today's Date: 01/03/2014 Time: 1345-1430 Time Calculation (min): 45 min   Skilled Therapeutic Interventions/Progress Updates:    Patient seen this pm for skilled OT intervention to address functional mobility as related to basic self care skills.  Addressed sit to stand transitions with decreased UE support, and safe rate of speed for controlled ascent.  Patient needs continued training on transitions from stand to sit.  Patient hesitant to flex hips and knees simultaneously to allow for controlled descent onto surface.  Patient using rolling walker to walk on carpeted surface in ADL apartment as approach to bathroom, in the event that he will not be able to fit a wheelchair in the bathroom.  Bathroom set up still uncertain at this time.  Patient with report of light-headedness at end of this session.  BP 150/78.    Therapy Documentation Precautions:  Precautions Precautions: Fall Restrictions Weight Bearing Restrictions: No   Pain: Pain Assessment Pain Assessment: No/denies pain     See FIM for current functional status  Therapy/Group: Individual Therapy  Collier Salina 01/03/2014, 3:07 PM

## 2014-01-03 NOTE — Progress Notes (Signed)
Orthopedic Tech Progress Note Patient Details:  [name] 1970/08/14 [id number]  Patient ID: [name], male   DOB: [birth year], 43 y.o.   MRN: [id number] Brace order completed by [facility], [place] 01/03/2014, 8:40 PM

## 2014-01-03 NOTE — Progress Notes (Signed)
Physical Therapy Session Note  Patient Details  Name: [name] MRN: [id number] Date of Birth: 1971/03/22  Today's Date: 01/03/2014 Time: 0930-1030 Time Calculation (min): 60 min  Short Term Goals: Week 2:  PT Short Term Goal 1 (Week 2): Pt will perform bed<>chair transfer with min A. PT Short Term Goal 2 (Week 2): Pt will perform gait x50' in controlled environment using LRAD with min A x1. PT Short Term Goal 3 (Week 2): Pt will perform w/c mobility x150' in controlled environment with supervision. PT Short Term Goal 4 (Week 2): Pt will perform dynamic standing balance x1 minute with min A.  Skilled Therapeutic Interventions/Progress Updates:   Skilled co-tx with rec therapist focusing on gait stability, stair negotiation, and increasing active control of trunk and R knee with transitional movements. Pt received seated in w/c with brother present; agreeable to therapy. Donned Teds, R AFO ([employer]er), and shoes. Performed w/c mobility x100' in controlled environment with bilat LE's and supervision, increased time. See below for detailed description of NMR performed in ortho gym. Performed gait x55' in controlled environment with +2A (3 musketeers assist for upright posture to prevent R hip retraction, R genu recurvatum) and manual facilitation of anterior/L weight shift, tactile cueing at R hip flexors to initiate R step; multimodal cueing to address R hip adduction during R swing. Negotiated 3 steps with L rail; +2A for safety. PT positioned under RUE providing postural stability and manual facilitation of L weight shift; rec therapist assisted with safe foot placement on stairs, as needed (~50% of trial). Session ended in pt room, where pt was left seated in w/c with brother present and all needs within reach.  Therapy Documentation Precautions:  Precautions Precautions: Fall Required Braces or Orthoses: Other Brace/Splint Other Brace/Splint: R AFO. Wear for 1 consecutive hour; remove  after each therapy session to minimize risk of skin breakdown. Restrictions Weight Bearing Restrictions: No Pain:  Pt reported no pain during PT session. NMR: Focused on transitional movements for motor control, grading of movement, and anterior weight shifting. Performed multiple trials of sit<>stand transfers without UE support; tactile cueing at R ribcage, L posterior pelvis to emphasize erect trunk flexion; with multimodal cueing for R knee control. Focused on slow, controlled performance of select ranges of transfer to increase active control of trunk and R knee.  See FIM for current functional status  Therapy/Group: Co-Treatment  Hobble, Lorenda Ishihara 01/03/2014, 7:33 PM

## 2014-01-03 NOTE — Progress Notes (Signed)
Subjective/Complaints: 43 year old male with history of DM type 2, seizure disorder, HTN, prior CVA who was admitted to [facility] on 12/14/13 with weakness X 2 days and difficulty talking. Patient had been out of BP medications X 2 1/2 months. CT head with stable hydrocephalus. MRI brain done revealing acute infarct left corona radiata, lentiform nuclei, left posterior temporal lobe and chronic severe ventriculomegaly/hydrocephalus  No bowel or bladder issues, no breathing problem, no pain c/os  Review of Systems - unable to obtain secondary to aphasia  Objective: Vital Signs: Blood pressure 143/86, pulse 74, temperature 97.7 F (36.5 C), temperature source Oral, resp. rate 19, weight 121.6 kg (268 lb 1.3 oz), SpO2 100.00%. No results found. Results for orders placed during the hospital encounter of 12/17/13 (from the past 72 hour(s))  GLUCOSE, CAPILLARY     Status: None   Collection Time    12/31/13  7:37 AM      Result Value Ref Range   Glucose-Capillary 97  70 - 99 mg/dL   Comment 1 Notify RN    GLUCOSE, CAPILLARY     Status: Abnormal   Collection Time    12/31/13 12:23 PM      Result Value Ref Range   Glucose-Capillary 127 (*) 70 - 99 mg/dL   Comment 1 Notify RN    GLUCOSE, CAPILLARY     Status: Abnormal   Collection Time    12/31/13  4:14 PM      Result Value Ref Range   Glucose-Capillary 128 (*) 70 - 99 mg/dL   Comment 1 Notify RN    GLUCOSE, CAPILLARY     Status: Abnormal   Collection Time    12/31/13  8:47 PM      Result Value Ref Range   Glucose-Capillary 103 (*) 70 - 99 mg/dL  GLUCOSE, CAPILLARY     Status: None   Collection Time    01/01/14  7:26 AM      Result Value Ref Range   Glucose-Capillary 95  70 - 99 mg/dL  GLUCOSE, CAPILLARY     Status: Abnormal   Collection Time    01/01/14 11:51 AM      Result Value Ref Range   Glucose-Capillary 106 (*) 70 - 99 mg/dL  GLUCOSE, CAPILLARY     Status: None   Collection Time    01/01/14  4:46 PM      Result Value Ref  Range   Glucose-Capillary 77  70 - 99 mg/dL  GLUCOSE, CAPILLARY     Status: Abnormal   Collection Time    01/01/14  8:36 PM      Result Value Ref Range   Glucose-Capillary 130 (*) 70 - 99 mg/dL  GLUCOSE, CAPILLARY     Status: None   Collection Time    01/02/14  7:15 AM      Result Value Ref Range   Glucose-Capillary 87  70 - 99 mg/dL  GLUCOSE, CAPILLARY     Status: Abnormal   Collection Time    01/02/14 11:43 AM      Result Value Ref Range   Glucose-Capillary 111 (*) 70 - 99 mg/dL  GLUCOSE, CAPILLARY     Status: None   Collection Time    01/02/14  4:25 PM      Result Value Ref Range   Glucose-Capillary 91  70 - 99 mg/dL  GLUCOSE, CAPILLARY     Status: Abnormal   Collection Time    01/02/14  9:03 PM  Result Value Ref Range   Glucose-Capillary 100 (*) 70 - 99 mg/dL      Mood and affect are appropriate  Heart: Regular rate and rhythm no rubs murmurs or extra sounds  Lungs: Clear to auscultation, breathing unlabored, no rales or wheezes  Abdomen: Positive bowel sounds, soft nontender to palpation, nondistended  Extremities: No clubbing, cyanosis, or edema  Skin: No evidence of breakdown except small area on L forearm, no evidence of rash  Neurologic: Cranial nerves II through XII intact, motor strength is 5/5 in Left deltoid, bicep, tricep, grip, hip flexor, knee extensors, ankle dorsiflexor and plantar flexor  3 minus/5 in the left biceps triceps and deltoid 4 minus at grip, 4 minus right hip flexor knee extensor ankle dorsiflexor plantar flexor  Decreased sensation in Right hand Speech reduced verbal output, able to follow basic commands Oriented to person, place ([place]) and Month,   Assessment/Plan: 1. Functional deficits secondary to Left CR, Lentiform nucleus, temporal infarct which require 3+ hours per day of interdisciplinary therapy in a comprehensive inpatient rehab setting. Physiatrist is providing close team supervision and 24 hour management of active  medical problems listed below. Physiatrist and rehab team continue to assess barriers to discharge/monitor patient progress toward functional and medical goals.  FIM: FIM - Bathing Bathing Steps Patient Completed: Chest;Right Arm;Abdomen;Front perineal area;Right upper leg;Left upper leg;Right lower leg (including foot);Left lower leg (including foot) Bathing: 4: Min-Patient completes 8-9 80f 10 parts or 75+ percent  FIM - Upper Body Dressing/Undressing Upper body dressing/undressing steps patient completed: Thread/unthread left sleeve of pullover shirt/dress;Put head through opening of pull over shirt/dress;Pull shirt over trunk Upper body dressing/undressing: 4: Min-Patient completed 75 plus % of tasks FIM - Lower Body Dressing/Undressing Lower body dressing/undressing steps patient completed: Thread/unthread left pants leg;Thread/unthread right pants leg Lower body dressing/undressing: 2: Max-Patient completed 25-49% of tasks  FIM - Toileting Toileting steps completed by patient: Performs perineal hygiene Toileting Assistive Devices: Grab bar or rail for support Toileting: 0: Activity did not occur  FIM - Diplomatic Services operational officer Devices: Grab bars Toilet Transfers: 0-Activity did not occur  FIM - Banker Devices: Arm rests;Walker Bed/Chair Transfer: 3: Bed > Chair or W/C: Mod A (lift or lower assist);3: Chair or W/C > Bed: Mod A (lift or lower assist)  FIM - Locomotion: Wheelchair Distance: 150 Locomotion: Wheelchair: 0: Activity did not occur FIM - Locomotion: Ambulation Locomotion: Ambulation Assistive Devices: Walker - Rolling;Orthosis Ambulation/Gait Assistance: 2: Max assist Locomotion: Ambulation: 0: Activity did not occur  Comprehension Comprehension Mode: Auditory Comprehension: 5-Understands basic 90% of the time/requires cueing < 10% of the time  Expression Expression Mode: Verbal Expression: 3-Expresses  basic 50 - 74% of the time/requires cueing 25 - 50% of the time. Needs to repeat parts of sentences.  Social Interaction Social Interaction: 5-Interacts appropriately 90% of the time - Needs monitoring or encouragement for participation or interaction.  Problem Solving Problem Solving: 4-Solves basic 75 - 89% of the time/requires cueing 10 - 24% of the time  Memory Memory: 3-Recognizes or recalls 50 - 74% of the time/requires cueing 25 - 49% of the time   Medical Problem List and Plan:  1. Functional deficits secondary to embolic infarcts left corona radiata, left lentiform nucleus, left temporal lobe with R Hemiparesis and aphasia  2. DVT Prophylaxis/Anticoagulation: Pharmaceutical: Lovenox  3. Pain Management: Tylenol 650 mg every 4 hours when necessary  4. Mood: Monitor for post stroke depression , flat affect, trial  celexa 5. Neuropsych: This patient is not capable of making decisions on his own behalf.  6. HTN: 7. DM type 2: controlled on  amaryl , monitor for hypoglycemia 8. Seizure disorder:  9.  Acute renal insufficiency, suspect ACE-I, HCTZ  Resolved on lower doses  Bowel inc, start bowel program, senna qhs , dulc qam LOS (Days) 17 A FACE TO FACE EVALUATION WAS PERFORMED  Tymel Conely E 01/03/2014, 7:00 AM

## 2014-01-03 NOTE — Plan of Care (Signed)
Problem: RH BOWEL ELIMINATION Goal: RH STG MANAGE BOWEL WITH ASSISTANCE STG Manage Bowel with Assistance.Max A  Outcome: Progressing Incontinent today x 2

## 2014-01-04 ENCOUNTER — Inpatient Hospital Stay (HOSPITAL_COMMUNITY): Payer: Medicare Other | Admitting: *Deleted

## 2014-01-04 ENCOUNTER — Inpatient Hospital Stay (HOSPITAL_COMMUNITY): Payer: Medicare Other | Admitting: Occupational Therapy

## 2014-01-04 ENCOUNTER — Inpatient Hospital Stay (HOSPITAL_COMMUNITY): Payer: Medicare Other

## 2014-01-04 ENCOUNTER — Inpatient Hospital Stay (HOSPITAL_COMMUNITY): Payer: Medicare Other | Admitting: Speech Pathology

## 2014-01-04 DIAGNOSIS — G8110 Spastic hemiplegia affecting unspecified side: Secondary | ICD-10-CM

## 2014-01-04 DIAGNOSIS — I6330 Cerebral infarction due to thrombosis of unspecified cerebral artery: Secondary | ICD-10-CM

## 2014-01-04 LAB — BASIC METABOLIC PANEL
BUN: 14 mg/dL (ref 6–23)
CO2: 26 mEq/L (ref 19–32)
Calcium: 9.3 mg/dL (ref 8.4–10.5)
Chloride: 102 mEq/L (ref 96–112)
Creatinine, Ser: 1.08 mg/dL (ref 0.50–1.35)
GFR calc non Af Amer: 84 mL/min — ABNORMAL LOW (ref >90)
Glucose, Bld: 85 mg/dL (ref 70–99)
POTASSIUM: 3.7 meq/L (ref 3.7–5.3)
SODIUM: 142 meq/L (ref 137–147)

## 2014-01-04 LAB — GLUCOSE, CAPILLARY
GLUCOSE-CAPILLARY: 82 mg/dL (ref 70–99)
Glucose-Capillary: 109 mg/dL — ABNORMAL HIGH (ref 70–99)
Glucose-Capillary: 152 mg/dL — ABNORMAL HIGH (ref 70–99)
Glucose-Capillary: 106 mg/dL — ABNORMAL HIGH (ref 70–99)

## 2014-01-04 NOTE — Progress Notes (Addendum)
Physical Therapy Session Note  Patient Details  Name: [name] MRN: [id number] Date of Birth: [birth year]  Today's Date: 01/04/2014 Time: 1000-1100 Time Calculation (min): 60 min  Short Term Goals: Week 2:  PT Short Term Goal 1 (Week 2): Pt will perform bed<>chair transfer with min A. PT Short Term Goal 2 (Week 2): Pt will perform gait x50' in controlled environment using LRAD with min A x1. PT Short Term Goal 3 (Week 2): Pt will perform w/c mobility x150' in controlled environment with supervision. PT Short Term Goal 4 (Week 2): Pt will perform dynamic standing balance x1 minute with min A.  Skilled Therapeutic Interventions/Progress Updates:    Co-tx with rec therapist focusing on assessing/addressing pt gait using new custom R AFO; gait training using LiteGait. Pt received seated in w/c with brother present. Educated pt/brother on importance of following AFO wearing schedule of 1 hour/day initially, gradually increasing wear time based on pt tolerance. Explained to pt/brother how to carefully expect skin after each wear. Recommended that pt/brother consult MD immediately if compromised skin integrity is suspected. Pt/brother verbalized understanding. Donned custom R AFO with Total A. Pt reporting significant discomfort at R lateral malleolus (in standing>seated) caused by pressure of R AFO. Therefore, doffed R AFO, contacted orthotist for modification, and transitioned to LiteGait interventions.   Gait training performed using LiteGait over treadmill without AFO, with heel lift in R shoe, and harness biased to decrease RLE weightbearing to facilitate RLE advancement/clearance. Ace bandage used to manually stabilize R hand at grab bar for RUE weightbearing. Pt ambulated x185' total on treadmill with tactile cueing provided at R ankle dorsiflexors and distal R hamstrings during RLE advancement; tactile cueing at R gluteus maximus during RLE stance phase to decrease hip retraction to prevent R  genu recurvatum. Manual facilitation of weight shift to L. Treadmill speed: .5; Time: 4:56 (rest breaks x2 to adjust harness, provide feedback). Noted R forefoot supination during RLE initial contact; notified orthotist of foot/ankle posture possibly causing discomfort while wearing R AFO.  Addendum: Downgraded the following long term goals (LTG) secondary to pt progressing more slowly that initially anticipated: dynamic standing balance; bed<>chair transfer; car/furniture transfers; gait in controlled and home environments; w/c mobility in controlled, home, and community environments. Discharged LTG's for gait in community environment and LTG addressing stair negotiation.   Therapy Documentation Precautions:  Precautions Precautions: Fall Required Braces or Orthoses: Other Brace/Splint Other Brace/Splint: R AFO. Wear for 1 consecutive hour; remove after each therapy session to minimize risk of skin breakdown. Restrictions Weight Bearing Restrictions: No Pain: Pain Assessment Pain Assessment: No/denies pain Pain Score: 0-No pain Locomotion : Ambulation Ambulation/Gait Assistance: 1: +1 Total assist   Therapy/Group: Co-Treatment  Hobble, Blair A 01/04/2014, 12:48 PM

## 2014-01-04 NOTE — Progress Notes (Signed)
Speech Language Pathology Daily Session Note  Patient Details  Name: [name] MRN: [id number] Date of Birth: 1970/12/05  Today's Date: 01/04/2014 Time: 9753-0051 Time Calculation (min): 55 min  Short Term Goals: Week 3: SLP Short Term Goal 1 (Week 3): Pt will demonstrate effective mastication of regular textures with Mod I without overt s/s of aspiration.  SLP Short Term Goal 2 (Week 3): Pt will attend to the right field of environment during functional tasks with modified independence.  SLP Short Term Goal 3 (Week 3): Pt will self-monitor and correct verbal errors with supervision multimodal cues.  SLP Short Term Goal 4 (Week 3): Pt will demonstrate functional problem solving for basic and familiar tasks with supervision mulimodal cues.  SLP Short Term Goal 5 (Week 3): Pt will improve will recall of daily events and information for 80% accuracy with min assist cuing  Skilled Therapeutic Interventions:  Pt was seen for skilled speech therapy targeting memory and expressive language.  SLP engaged pt in a structured new learning activity targeting functional word finding and working memory.  Pt recalled 75% of targeted categories independently via spaced retrieval methods, improving to 100% accuracy with min verbal cuing.  Pt required mod assist verbal cues and supported conversation techniques including increased processing time and guided questioning for functional word finding.   FIM:  Comprehension Comprehension Mode: Auditory Comprehension: 5-Understands basic 90% of the time/requires cueing < 10% of the time Expression Expression Mode: Verbal Expression: 4-Expresses basic 75 - 89% of the time/requires cueing 10 - 24% of the time. Needs helper to occlude trach/needs to repeat words. Social Interaction Social Interaction: 5-Interacts appropriately 90% of the time - Needs monitoring or encouragement for participation or interaction. Problem Solving Problem Solving: 4-Solves basic 75  - 89% of the time/requires cueing 10 - 24% of the time Memory Memory: 4-Recognizes or recalls 75 - 89% of the time/requires cueing 10 - 24% of the time  Pain Pain Assessment Pain Assessment: No/denies pain  Therapy/Group: Individual Therapy  Jackalyn Lombard, M.A. CCC-SLP  Artyom Stencel, [name] 01/04/2014, 4:26 PM

## 2014-01-04 NOTE — Plan of Care (Signed)
Problem: RH Ambulation Goal: LTG Patient will ambulate in community environment (PT) LTG: Patient will ambulate in community environment, # of feet with assistance (PT).  Outcome: Not Applicable Date Met:  06/30/48 Discharged, as pt will likely be utilizing w/c in community environment.  Problem: RH Stairs Goal: LTG Patient will ambulate up and down stairs w/assist (PT) LTG: Patient will ambulate up and down # of stairs with assistance (PT)  Outcome: Not Applicable Date Met:  75/30/05 N/A secondary to pt's brother reporting pt likely moving into level apartment at D/C. Will reactivate if needed.

## 2014-01-04 NOTE — Progress Notes (Signed)
Subjective/Complaints: 43 year old male with history of DM type 2, seizure disorder, HTN, prior CVA who was admitted to [facility] on 12/14/13 with weakness X 2 days and difficulty talking. Patient had been out of BP medications X 2 1/2 months. CT head with stable hydrocephalus. MRI brain done revealing acute infarct left corona radiata, lentiform nuclei, left posterior temporal lobe and chronic severe ventriculomegaly/hydrocephalus  No bowel or bladder issues, no breathing problem, no pain c/os  Review of Systems - unable to obtain secondary to aphasia  Objective: Vital Signs: Blood pressure 146/88, pulse 71, temperature 98.2 F (36.8 C), temperature source Oral, resp. rate 20, weight 121.6 kg (268 lb 1.3 oz), SpO2 97.00%. No results found. Results for orders placed during the hospital encounter of 12/17/13 (from the past 72 hour(s))  GLUCOSE, CAPILLARY     Status: None   Collection Time    01/01/14  7:26 AM      Result Value Ref Range   Glucose-Capillary 95  70 - 99 mg/dL  GLUCOSE, CAPILLARY     Status: Abnormal   Collection Time    01/01/14 11:51 AM      Result Value Ref Range   Glucose-Capillary 106 (*) 70 - 99 mg/dL  GLUCOSE, CAPILLARY     Status: None   Collection Time    01/01/14  4:46 PM      Result Value Ref Range   Glucose-Capillary 77  70 - 99 mg/dL  GLUCOSE, CAPILLARY     Status: Abnormal   Collection Time    01/01/14  8:36 PM      Result Value Ref Range   Glucose-Capillary 130 (*) 70 - 99 mg/dL  GLUCOSE, CAPILLARY     Status: None   Collection Time    01/02/14  7:15 AM      Result Value Ref Range   Glucose-Capillary 87  70 - 99 mg/dL  GLUCOSE, CAPILLARY     Status: Abnormal   Collection Time    01/02/14 11:43 AM      Result Value Ref Range   Glucose-Capillary 111 (*) 70 - 99 mg/dL  GLUCOSE, CAPILLARY     Status: None   Collection Time    01/02/14  4:25 PM      Result Value Ref Range   Glucose-Capillary 91  70 - 99 mg/dL  GLUCOSE, CAPILLARY     Status: Abnormal    Collection Time    01/02/14  9:03 PM      Result Value Ref Range   Glucose-Capillary 100 (*) 70 - 99 mg/dL  GLUCOSE, CAPILLARY     Status: None   Collection Time    01/03/14  7:14 AM      Result Value Ref Range   Glucose-Capillary 83  70 - 99 mg/dL   Comment 1 Notify RN    GLUCOSE, CAPILLARY     Status: Abnormal   Collection Time    01/03/14 11:15 AM      Result Value Ref Range   Glucose-Capillary 168 (*) 70 - 99 mg/dL   Comment 1 Notify RN    GLUCOSE, CAPILLARY     Status: Abnormal   Collection Time    01/03/14  4:26 PM      Result Value Ref Range   Glucose-Capillary 112 (*) 70 - 99 mg/dL  GLUCOSE, CAPILLARY     Status: None   Collection Time    01/03/14  8:47 PM      Result Value Ref Range   Glucose-Capillary  88  70 - 99 mg/dL      Mood and affect are appropriate  Heart: Regular rate and rhythm no rubs murmurs or extra sounds  Lungs: Clear to auscultation, breathing unlabored, no rales or wheezes  Abdomen: Positive bowel sounds, soft nontender to palpation, nondistended  Extremities: No clubbing, cyanosis, or edema  Skin: No evidence of breakdown except small area on L forearm, no evidence of rash  Neurologic: Cranial nerves II through XII intact, motor strength is 5/5 in Left deltoid, bicep, tricep, grip, hip flexor, knee extensors, ankle dorsiflexor and plantar flexor  3 minus/5 in the left biceps triceps and deltoid 4 minus at grip, 4 minus right hip flexor knee extensor ankle dorsiflexor plantar flexor  Decreased sensation in Right hand Speech reduced verbal output, able to follow basic commands Oriented to person, place ([place]) and Month,   Assessment/Plan: 1. Functional deficits secondary to Left CR, Lentiform nucleus, temporal infarct which require 3+ hours per day of interdisciplinary therapy in a comprehensive inpatient rehab setting. Physiatrist is providing close team supervision and 24 hour management of active medical problems listed  below. Physiatrist and rehab team continue to assess barriers to discharge/monitor patient progress toward functional and medical goals.  FIM: FIM - Bathing Bathing Steps Patient Completed: Chest;Right Arm;Abdomen;Front perineal area;Right upper leg;Left upper leg;Right lower leg (including foot);Left lower leg (including foot) Bathing: 4: Min-Patient completes 8-9 [id number] 10 parts or 75+ percent  FIM - Upper Body Dressing/Undressing Upper body dressing/undressing steps patient completed: Thread/unthread right sleeve of pullover shirt/dresss;Thread/unthread left sleeve of pullover shirt/dress;Put head through opening of pull over shirt/dress;Pull shirt over trunk Upper body dressing/undressing: 5: Supervision: Safety issues/verbal cues FIM - Lower Body Dressing/Undressing Lower body dressing/undressing steps patient completed: Pull pants up/down;Thread/unthread left pants leg Lower body dressing/undressing: 3: Mod-Patient completed 50-74% of tasks  FIM - Toileting Toileting steps completed by patient: Performs perineal hygiene Toileting Assistive Devices: Grab bar or rail for support Toileting: 0: Activity did not occur  FIM - Diplomatic Services operational officerToilet Transfers Toilet Transfers Assistive Devices: Grab bars Toilet Transfers: 0-Activity did not occur  FIM - BankerBed/Chair Transfer Bed/Chair Transfer Assistive Devices: Arm rests;Walker;Orthosis (R hand orthosis, R AFO) Bed/Chair Transfer: 3: Bed > Chair or W/C: Mod A (lift or lower assist);4: Chair or W/C > Bed: Min A (steadying Pt. > 75%)  FIM - Locomotion: Wheelchair Distance: 100 Locomotion: Wheelchair: 2: Travels 50 - 149 ft with supervision, cueing or coaxing FIM - Locomotion: Ambulation Locomotion: Ambulation Assistive Devices: Walker - Rolling;Orthosis;Other (comment) (R hand orthosis, R AFO) Ambulation/Gait Assistance: 1: +2 Total assist Locomotion: Ambulation: 1: Two helpers  Comprehension Comprehension Mode: Auditory Comprehension: 5-Understands  basic 90% of the time/requires cueing < 10% of the time  Expression Expression Mode: Verbal Expression: 3-Expresses basic 50 - 74% of the time/requires cueing 25 - 50% of the time. Needs to repeat parts of sentences.  Social Interaction Social Interaction: 5-Interacts appropriately 90% of the time - Needs monitoring or encouragement for participation or interaction.  Problem Solving Problem Solving: 4-Solves basic 75 - 89% of the time/requires cueing 10 - 24% of the time  Memory Memory: 3-Recognizes or recalls 50 - 74% of the time/requires cueing 25 - 49% of the time   Medical Problem List and Plan:  1. Functional deficits secondary to embolic infarcts left corona radiata, left lentiform nucleus, left temporal lobe with R Hemiparesis and aphasia  2. DVT Prophylaxis/Anticoagulation: Pharmaceutical: Lovenox  3. Pain Management: Tylenol 650 mg every 4 hours when necessary  4.  Mood: Monitor for post stroke depression , flat affect, trial celexa 5. Neuropsych: This patient is not capable of making decisions on his own behalf.  6. HTN: 7. DM type 2: controlled on  amaryl , monitor for hypoglycemia 8. H/O Seizure disorder: only on gabapentin 9.  Acute renal insufficiency, suspect ACE-I, HCTZ  Resolved on lower doses  LOS (Days) 18 A FACE TO FACE EVALUATION WAS PERFORMED  KIRSTEINS,ANDREW E 01/04/2014, 6:15 AM

## 2014-01-04 NOTE — Progress Notes (Signed)
Occupational Therapy Session Note  Patient Details  Name: [name] MRN: [id number] Date of Birth: 1970-12-30  Today's Date: 01/04/2014 Time: 0830-0930 Time Calculation (min): 60 min  Short Term Goals: Week 3:  OT Short Term Goal 1 (Week 3): Patient will transfer to toilet with min assist OT Short Term Goal 2 (Week 3): Patient will transfer to shower with min assist OT Short Term Goal 3 (Week 3): Patient will dress lower body with min assist  Skilled Therapeutic Interventions/Progress Updates: ADL-retraining with focus on transfers, family ed, initiation, safety awareness, sit<>stand, and weight-shifting.   Patient received supine in bed, alert and oriented to place and situation.   Patient rose to sitting at edge of bed using bed rail and HOB elevated independently.   OT muted television and presented initial plan to shower at tub room however patient was mildly confused when given choices for clothing and became distracted by need to locate his primary pair of shoes, removed by SLP.  Patient's brother arrived during sit<>stand and SPT transfer to w/c which appeared to distract patient somewhat as well.   Patient reported need to void BM and brother responded by presenting BSC.   Patient completed transfer to [facility] with min assist but did not fit well on BSC.  OT presented pt with urinal which he was unable to use successfully and then placed on the bed.   Shortly afterwards, patient urinated on the floor without control.   Patient completed shower level bathing with good thoroughness and required assist only to wash buttocks and left arm.    Patient completed dressing at edge of bed with min assist to don shirt and overall max assist for lower body although able to maintain sitting balance unsupported for 5 min.  Patient left in w/c with brother in room attending to his care.     Therapy Documentation Precautions:  Precautions Precautions: Fall Required Braces or Orthoses: Other  Brace/Splint Other Brace/Splint: R AFO. Wear for 1 consecutive hour; remove after each therapy session to minimize risk of skin breakdown. Restrictions Weight Bearing Restrictions: No  Pain: Pain Assessment Pain Assessment: No/denies pain Pain Score: 0-No pain  See FIM for current functional status  Therapy/Group: Individual Therapy  BARTHOLD,FRANK 01/04/2014, 10:17 AM

## 2014-01-04 NOTE — Progress Notes (Signed)
Occupational Therapy Session Note  Patient Details  Name: [name] MRN: [id number] Date of Birth: 24-Dec-1970  Today's Date: 01/04/2014 Time: 1330-1400 Time Calculation (min): 30 min   Skilled Therapeutic Interventions/Progress Updates:    Patient seen this pm for OT intervention to address active, volitional movement in right upper extremity.  Patient able to weight bear onto right forearm, and support body weight in side arm prop.  Patient able to activate scapula stabilizers in this position to reach overhead with left arm.  Patient able to isolate elbow flexion and extension through 75% of range following weight bearing tasks - with mod cueing to visually attend to right arm.    Therapy Documentation Precautions:  Precautions Precautions: Fall Required Braces or Orthoses: Other Brace/Splint Other Brace/Splint: R AFO. Wear for 1 consecutive hour; remove after each therapy session to minimize risk of skin breakdown. Restrictions Weight Bearing Restrictions: No  Pain:  No report of pain     See FIM for current functional status  Therapy/Group: Individual Therapy  Collier Salina 01/04/2014, 2:28 PM

## 2014-01-05 ENCOUNTER — Encounter (HOSPITAL_COMMUNITY): Payer: Medicare Other | Admitting: Occupational Therapy

## 2014-01-05 ENCOUNTER — Ambulatory Visit (HOSPITAL_COMMUNITY): Payer: Medicare Other | Admitting: *Deleted

## 2014-01-05 ENCOUNTER — Inpatient Hospital Stay (HOSPITAL_COMMUNITY): Payer: Medicare Other | Admitting: Speech Pathology

## 2014-01-05 ENCOUNTER — Inpatient Hospital Stay (HOSPITAL_COMMUNITY): Payer: Medicare Other

## 2014-01-05 LAB — GLUCOSE, CAPILLARY
GLUCOSE-CAPILLARY: 148 mg/dL — AB (ref 70–99)
GLUCOSE-CAPILLARY: 181 mg/dL — AB (ref 70–99)
GLUCOSE-CAPILLARY: 132 mg/dL — AB (ref 70–99)
Glucose-Capillary: 96 mg/dL (ref 70–99)

## 2014-01-05 NOTE — Progress Notes (Signed)
Speech Language Pathology Daily Session Note  Patient Details  Name: [name] MRN: [id number] Date of Birth: [birth year]  Today's Date: 01/05/2014 Time: 1350-1430 Time Calculation (min): 40 min  Short Term Goals: Week 3: SLP Short Term Goal 1 (Week 3): Pt will demonstrate effective mastication of regular textures with Mod I without overt s/s of aspiration.  SLP Short Term Goal 2 (Week 3): Pt will attend to the right field of environment during functional tasks with modified independence.  SLP Short Term Goal 3 (Week 3): Pt will self-monitor and correct verbal errors with supervision multimodal cues.  SLP Short Term Goal 4 (Week 3): Pt will demonstrate functional problem solving for basic and familiar tasks with supervision mulimodal cues.  SLP Short Term Goal 5 (Week 3): Pt will improve will recall of daily events and information for 80% accuracy with min assist cuing  Skilled Therapeutic Interventions:  Pt was seen for skilled speech therapy targeting family education.  Pt's brother was present for the duration of today's therapy session and remained actively engaged throughout training.  SLP initiated skilled education related to dysphagia management including rationale behind pt's currently prescribed diet, mechanical soft consistencies, and s/s of aspiration.  SLP also initiated skilled education related to aphasia including skilled word finding strategies and supported conversations techniques to facilitate pt's independence for communicating needs and wants.  SLP provided recommendations for follow up speech therapy, supervision for safety, and assistance for medication and financial management upon discharge given that pt presents with mild-moderate cognitive impairments and remains altered from his baseline.  Pt's brother verbalized understanding of all provided education and all his questions were answered at this time.  Continue per current plan of care.    FIM:   Comprehension Comprehension Mode: Auditory Comprehension: 5-Understands basic 90% of the time/requires cueing < 10% of the time Expression Expression Mode: Verbal Expression: 4-Expresses basic 75 - 89% of the time/requires cueing 10 - 24% of the time. Needs helper to occlude trach/needs to repeat words. Social Interaction Social Interaction: 5-Interacts appropriately 90% of the time - Needs monitoring or encouragement for participation or interaction. Problem Solving Problem Solving: 4-Solves basic 75 - 89% of the time/requires cueing 10 - 24% of the time Memory Memory: 4-Recognizes or recalls 75 - 89% of the time/requires cueing 10 - 24% of the time  Pain Pain Assessment Pain Assessment: No/denies pain  Therapy/Group: Individual Therapy  Jackalyn Lombard, M.A. CCC-SLP  Daiveon Markman, [name] 01/05/2014, 5:22 PM

## 2014-01-05 NOTE — Progress Notes (Signed)
Social Work Lucy Chrisebecca G Gaspare Netzel, LCSW Social Worker Signed  Patient Care Conference Service date: 01/05/2014 4:04 PM  Inpatient RehabilitationTeam Conference and Plan of Care Update Date: 01/05/2014   Time: 10;40 AM     Patient Name: [name]       Medical Record Number: [id number]   Date of Birth: Apr 07, 1971 Sex: Male         Room/Bed: 4M03C/4M03C-01 Payor Info: Payor: MEDICARE / Plan: MEDICARE PART A AND B / Product Type: *No Product type* /   Admitting Diagnosis: L  MCA AND BG CVA syncope   Admit Date/Time:  12/17/2013  2:43 PM Admission Comments: No comment available   Primary Diagnosis:  CVA (cerebral infarction) Principal Problem: CVA (cerebral infarction)    Patient Active Problem List     Diagnosis  Date Noted   .  CVA (cerebral infarction)  12/17/2013   .  HTN (hypertension)  12/17/2013   .  Diabetes  12/17/2013   .  Cardiomyopathy  12/17/2013     Expected Discharge Date: Expected Discharge Date: 01/07/14  Team Members Present: Physician leading conference: Dr. Claudette LawsAndrew Kirsteins Social Worker Present: Dossie DerBecky Lorin Hauck, LCSW Nurse Present: Chana Bodeeborah Sharp, RN PT Present: Wanda Plumparoline Cook, PT;Blair Hobble, PT OT Present: Bretta BangKris Gellert, OT SLP Present: Other (comment) Joni Reining(Nicole Page-SP) PPS Coordinator present : Edson SnowballBecky Windsor, Chapman FitchPT;Marie Noel, RN, CRRN        Current Status/Progress  Goal  Weekly Team Focus   Medical     aphasia, R hemi, Bowel and Bladder improved  D/C to home  family training   Bowel/Bladder     Incontinent of bowel. LBM 01/03/14. Pt receiving schedule suppository q morning. Pt continent of bladder.   Continent of bowel  Place on toilet q 30-1hr after suppository given   Swallow/Nutrition/ Hydration     Dys 3 textures and thin liquids with intermittent supervision  least restrictive PO intake    trials of regular textures    ADL's     mod/min  downgraded to min assist  improve postural control, safety, speed of transfers, family education   Mobility    Supervision bed mobility; Min-Mod A transgers; Gait varies from Mod A to +2A.  Downgraded to Min A overall with exception of Mod A with gait  Postural stability, safety with functional transfers, gait, stairs, family training/education   Communication     overall min assist, but fluctuates to mod assist with fatigue   supervision   use of strategies during functional conversation    Safety/Cognition/ Behavioral Observations    min-supervision assist   supervision   safety awareness, memory, problem solving   Pain     No c/o pain  <3  Assess for non-verbal cues of pain   Skin     Loop recorder intact. Abrasive area on R buttock  No additional skin breakdown  Remind pt to turn q 2-3 hrs.     *See Care Plan and progress notes for long and short-term goals.    Barriers to Discharge:  D/C environment unclear      Possible Resolutions to Barriers:    SW to assist      Discharge Planning/Teaching Needs:    Home with brother who can provide 24 hr care-here learning his care this week-unsure if has moved in yet, try to confirm today      Team Discussion:    Family education with brother this week.  Will not need insulin at home. Modify brace. Progressing in therapies-brother can  do min/mod level of care.   Revisions to Treatment Plan:    Downgraded goals to min level    Continued Need for Acute Rehabilitation Level of Care: The patient requires daily medical management by a physician with specialized training in physical medicine and rehabilitation for the following conditions: Daily direction of a multidisciplinary physical rehabilitation program to ensure safe treatment while eliciting the highest outcome that is of practical value to the patient.: Yes Daily medical management of patient stability for increased activity during participation in an intensive rehabilitation regime.: Yes Daily analysis of laboratory values and/or radiology reports with any subsequent need for medication  adjustment of medical intervention for : Neurological problems;Other  Lucy Chris 01/05/2014, 4:04 PM         Lucy Chris, LCSW Social Worker Signed  Patient Care Conference Service date: 12/29/2013 12:49 PM  Inpatient RehabilitationTeam Conference and Plan of Care Update Date: 12/29/2013   Time: 10;30 AM     Patient Name: [name]       Medical Record Number: [id number]   Date of Birth: [birth year] Sex: Male         Room/Bed: 4M03C/4M03C-01 Payor Info: Payor: MEDICARE / Plan: MEDICARE PART A AND B / Product Type: *No Product type* /   Admitting Diagnosis: L  MCA AND BG CVA syncope   Admit Date/Time:  12/17/2013  2:43 PM Admission Comments: No comment available   Primary Diagnosis:  CVA (cerebral infarction) Principal Problem: CVA (cerebral infarction)    Patient Active Problem List     Diagnosis  Date Noted   .  CVA (cerebral infarction)  12/17/2013   .  HTN (hypertension)  12/17/2013   .  Diabetes  12/17/2013   .  Cardiomyopathy  12/17/2013     Expected Discharge Date: Expected Discharge Date: 01/07/14  Team Members Present: Social Worker Present: Dossie Der, LCSW Nurse Present: Carlean Purl, RN PT Present: Wanda Plump, PT;Blair Hobble, PT OT Present: Bretta Bang, OT SLP Present: Other (comment) Joni Reining Page-SP) PPS Coordinator present : Tora Duck, RN, CRRN        Current Status/Progress  Goal  Weekly Team Focus   Medical     aphasia, cog defs, bowel inc at times due to initiation, poor awareness  Improve incont  bowel program, toileting program   Bowel/Bladder     Continent of bowel and bladder; LBM today following bowel program of suppository... patient was continent of bowel before implementing bowel program. Do we still need this?  Maintain continence of bowel and bladder with mod assist  Timed toileting, urinal, continue bowel program   Swallow/Nutrition/ Hydration     Dys 3 textures and thin liquids with trial of intermittent supervision    least restrictive PO intake  carryover of safe swallowing strategies    ADL's     mod assist  supervision / min assist  Improve postural control, sustained muscle activation right extremities, family education with brother   Mobility     Supervision-Min A bed mobility; Mod-Max A transfers; Mod A gait (+2A for w/c follow)  Supervision to Min A  postural stability, bed mobility, functional transfers, gait   Communication     min-mod assist   supervision   use of strategies during functional conversations    Safety/Cognition/ Behavioral Observations    min assist   supervision   increase self-monitoring and correcting of verbal errors, awareness   Pain     Denies pain  < 3  Assess and  treat for pain q shift and prn   Skin     Loop recorder left chest- steri strips intact  No skin breakdown or infection during rehab stay with mod assist  Assess skin q shift and prn     *See Care Plan and progress notes for long and short-term goals.    Barriers to Discharge:  Ltd. resources at home, brother works part time, in process of moving      Possible Resolutions to Barriers:    Social work to assist with discharge planning, see above      Discharge Planning/Teaching Needs:    Brother back in [place] making arrangements for living and moving them.  Plans to be back prior to pt';s discharge to go through education with him      Team Discussion:    Making good progress-better with his yes/no.  Receptive language better.  Timed toileting.  Brother working on housing situation   Revisions to Treatment Plan:    None    Continued Need for Acute Rehabilitation Level of Care: The patient requires daily medical management by a physician with specialized training in physical medicine and rehabilitation for the following conditions: Daily direction of a multidisciplinary physical rehabilitation program to ensure safe treatment while eliciting the highest outcome that is of practical value to the  patient.: Yes Daily medical management of patient stability for increased activity during participation in an intensive rehabilitation regime.: Yes Daily analysis of laboratory values and/or radiology reports with any subsequent need for medication adjustment of medical intervention for : Neurological problems;Other  Lucy Chris 12/29/2013, 12:49 PM         Lucy Chris, LCSW Social Worker Signed  Patient Care Conference Service date: 12/22/2013 1:09 PM  Inpatient RehabilitationTeam Conference and Plan of Care Update Date: 12/22/2013   Time: 11;40 Am     Patient Name: AMADO ANDAL       Medical Record Number: [id number]   Date of Birth: 1971-01-04 Sex: Male         Room/Bed: 4M03C/4M03C-01 Payor Info: Payor: MEDICARE / Plan: MEDICARE PART A AND B / Product Type: *No Product type* /   Admitting Diagnosis: L  MCA AND BG CVA syncope   Admit Date/Time:  12/17/2013  2:43 PM Admission Comments: No comment available   Primary Diagnosis:  CVA (cerebral infarction) Principal Problem: CVA (cerebral infarction)    Patient Active Problem List     Diagnosis  Date Noted   .  CVA (cerebral infarction)  12/17/2013   .  HTN (hypertension)  12/17/2013   .  Diabetes  12/17/2013   .  Cardiomyopathy  12/17/2013     Expected Discharge Date: Expected Discharge Date: 01/07/14  Team Members Present: Physician leading conference: Dr. Claudette Laws Social Worker Present: Dossie Der, LCSW Nurse Present: Carlean Purl, RN PT Present: Wanda Plump, PT;Blair Hobble, PT OT Present: Bretta Bang, OT SLP Present: Other (comment);Fae Pippin, SLP Joni Reining Page-Sp) PPS Coordinator present : Tora Duck, RN, CRRN;Becky Henrene Dodge, PT        Current Status/Progress  Goal  Weekly Team Focus   Medical     Aphasia, cognitive deficits, right hemiparesis  Improve mobility and communication  Improve transfers and balance   Bowel/Bladder     Incontinent of bowel and bladder. LBM 12/21/13 after  suppository  Managed bowel and bladder  Timed toileting during the day. Condom cath @[facility]    Swallow/Nutrition/ Hydration     Dys.3 textures and thin liquids with full  supervision   least restrictive PO intake  carryover of safe swallow strategies    ADL's     Max assist  supervision / min assist  Improve postural control, initiation, sustained muscle activation right extremities, family education with brother   Mobility     Mod-Max A bed mobility; Max-Total A transfers; +2A gait  Supervision to Min A  postural stability, bed mobility, functional transfers, gait, initiation of family education/training   Communication     Mod assist   Supervision   education and use of strategies   Safety/Cognition/ Behavioral Observations    Min-Mod assist  Supervision   increase self-monitoring and correcting and awareness   Pain     No c/o pain  <3  Monitor for nonverbal cues of pain   Skin     Loop recorder with dressing, intact  No additional skin breakdown  Monitor q shift     *See Care Plan and progress notes for long and short-term goals.    Barriers to Discharge:  Ltd. resources at home, brother works part time, in process of moving      Possible Resolutions to Barriers:    Social work to assist with discharge planning      Discharge Planning/Teaching Needs:    Home with brother who plans to provide care, brother may go home and return prior to pt's discharge, so he can work some.  Discussed rehab will not be providing him food while here.      Team Discussion:    Timed toileting work on continency. Good carryover with therapies and making good progress. Yes/no not always accurate.  Brother is supportive and staying here with him, but may need to go home to work on living arrangements.   Revisions to Treatment Plan:    None    Continued Need for Acute Rehabilitation Level of Care: The patient requires daily medical management by a physician with specialized training in physical medicine  and rehabilitation for the following conditions: Daily direction of a multidisciplinary physical rehabilitation program to ensure safe treatment while eliciting the highest outcome that is of practical value to the patient.: Yes Daily medical management of patient stability for increased activity during participation in an intensive rehabilitation regime.: Yes Daily analysis of laboratory values and/or radiology reports with any subsequent need for medication adjustment of medical intervention for : Neurological problems  Lucy Chris 12/24/2013, 9:10 AM          Patient ID: Patrick Dana, male   DOB: 12-17-1970, 43 y.o.   MRN: [id number]

## 2014-01-05 NOTE — Progress Notes (Signed)
Physical Therapy Session Note  Patient Details  Name: [name] MRN: [id number] Date of Birth: Nov 05, 1970  Today's Date: 01/05/2014 Time: 0800-0858 Time Calculation (min): 58 min  Short Term Goals: Week 2:  PT Short Term Goal 1 (Week 2): Pt will perform bed<>chair transfer with min A. PT Short Term Goal 2 (Week 2): Pt will perform gait x50' in controlled environment using LRAD with min A x1. PT Short Term Goal 3 (Week 2): Pt will perform w/c mobility x150' in controlled environment with supervision. PT Short Term Goal 4 (Week 2): Pt will perform dynamic standing balance x1 minute with min A.  Skilled Therapeutic Interventions/Progress Updates:    Pt received in bed with HOB elevated finishing breakfast. Pt expressed need for bowel movement. Pt moved to seated EOB w/ MinA, then SPT to w/c w/ ModA, no AD. Pt w/ MaxA SPT to standard toilet, req. (a) to manage clothing and position on toilet. Pt spent toileted for 10 minutes, was able to express when done with min cues. Therapist performed perineal care, and PT/NT assisted with donning briefs on pt. Rest of session focused on sit<>stand transfers and NMR for RLE in standing. Pt able to consistently come sit>stand w/ CGA, Min/ModA to maintain standing w/ weight shifts to RLE w/ LUE on rail. Pt performed x5 forward leans in sitting to improve sit>stand and x5 mini-squats in standing to improve stand >sit. Pt w/ ModA SPT back to w/c, no AD. Pt left in room seated in w/c with all needs within reach w/ brother present.   Therapy Documentation Precautions:  Precautions Precautions: Fall Required Braces or Orthoses: Other Brace/Splint Other Brace/Splint: R AFO. Wear for 1 consecutive hour; remove after each therapy session to minimize risk of skin breakdown. Restrictions Weight Bearing Restrictions: No General: Amount of Missed PT Time (min): 10 Minutes Missed Time Reason: Other (comment) (Toileting) Pain: Pain Assessment Pain Assessment:  No/denies pain Pain Score: 0-No pain  See FIM for current functional status  Therapy/Group: Individual Therapy  Hosie Spangle Hosie Spangle, PT, DPT  01/05/2014, 9:13 AM

## 2014-01-05 NOTE — Patient Care Conference (Signed)
Inpatient RehabilitationTeam Conference and Plan of Care Update Date: 01/05/2014   Time: 10;40 AM    Patient Name: [name]      Medical Record Number: [id number]  Date of Birth: 03/22/1971 Sex: Male         Room/Bed: 4M03C/4M03C-01 Payor Info: Payor: MEDICARE / Plan: MEDICARE PART A AND B / Product Type: *No Product type* /    Admitting Diagnosis: L  MCA AND BG CVA syncope  Admit Date/Time:  12/17/2013  2:43 PM Admission Comments: No comment available   Primary Diagnosis:  CVA (cerebral infarction) Principal Problem: CVA (cerebral infarction)  Patient Active Problem List   Diagnosis Date Noted  . CVA (cerebral infarction) 12/17/2013  . HTN (hypertension) 12/17/2013  . Diabetes 12/17/2013  . Cardiomyopathy 12/17/2013    Expected Discharge Date: Expected Discharge Date: 01/07/14  Team Members Present: Physician leading conference: Dr. Claudette LawsAndrew [name] Social Worker Present: Patrick DerBecky Glennda Weatherholtz, LCSW Nurse Present: Patrick Bodeeborah Sharp, RN PT Present: [name], PT;[name], PT OT Present: [name], OT SLP Present: Other (comment) Patrick Reining(Nicole [name]) PPS Coordinator present : [name], [name];Patrick Noel, RN, CRRN     Current Status/Progress Goal Weekly Team Focus  Medical   aphasia, R hemi, Bowel and Bladder improved  D/C to home  family training   Bowel/Bladder   Incontinent of bowel. LBM 01/03/14. Pt receiving schedule suppository q morning. Pt continent of bladder.   Continent of bowel  Place on toilet q 30-1hr after suppository given   Swallow/Nutrition/ Hydration   Dys 3 textures and thin liquids with intermittent supervision  least restrictive PO intake    trials of regular textures    ADL's   mod/min  downgraded to min assist  improve postural control, safety, speed of transfers, family education   Mobility   Supervision bed mobility; Min-Mod A transgers; Gait varies from Mod A to +2A.  Downgraded to Min A overall with exception of Mod A with gait  Postural  stability, safety with functional transfers, gait, stairs, family training/education   Communication   overall min assist, but fluctuates to mod assist with fatigue   supervision   use of strategies during functional conversation    Safety/Cognition/ Behavioral Observations  min-supervision assist   supervision   safety awareness, memory, problem solving   Pain   No c/o pain  <3  Assess for non-verbal cues of pain   Skin   Loop recorder intact. Abrasive area on R buttock  No additional skin breakdown  Remind pt to turn q 2-3 hrs.      *See Care Plan and progress notes for long and short-term goals.  Barriers to Discharge: D/C environment unclear    Possible Resolutions to Barriers:  SW to assist    Discharge Planning/Teaching Needs:  Home with brother who can provide 24 hr care-here learning his care this week-unsure if has moved in yet, try to confirm today      Team Discussion:  Family education with brother this week.  Will not need insulin at home. Modify brace. Progressing in therapies-brother can do min/mod level of care.  Revisions to Treatment Plan:  Downgraded goals to min level   Continued Need for Acute Rehabilitation Level of Care: The patient requires daily medical management by a physician with specialized training in physical medicine and rehabilitation for the following conditions: Daily direction of a multidisciplinary physical rehabilitation program to ensure safe treatment while eliciting the highest outcome that is of practical value to the patient.: Yes Daily  medical management of patient stability for increased activity during participation in an intensive rehabilitation regime.: Yes Daily analysis of laboratory values and/or radiology reports with any subsequent need for medication adjustment of medical intervention for : Neurological problems;Other  [name] 01/05/2014, 4:04 PM

## 2014-01-05 NOTE — Progress Notes (Signed)
Social Work Patient ID: [name], male   DOB: [birth year], 43 y.o.   MRN: [id number] Met with pt and brother to discuss team conference progression toward his goals-min level and discharge 7/3.  Family education on-going with brother. He plans to leave tonight and return on Friday to transport pt home.  He is moving into cousin's home tomorrow and will prepare for him coming home Friday.  Discussed will have DME delivered to pt's room and set up home health and on Friday brother will have their new address for worker to give to home health. Blair-PT aware to do education for three steps with brother today in therapy.  Will work on discharge plans.

## 2014-01-05 NOTE — Progress Notes (Signed)
Subjective/Complaints: 43 year old male with history of DM type 2, seizure disorder, HTN, prior CVA who was admitted to [facility] on 12/14/13 with weakness X 2 days and difficulty talking. Patient had been out of BP medications X 2 1/2 months. CT head with stable hydrocephalus. MRI brain done revealing acute infarct left corona radiata, lentiform nuclei, left posterior temporal lobe and chronic severe ventriculomegaly/hydrocephalus  No bowel or bladder issues, no breathing problem, no pain c/os, brother with him today  Review of Systems - unable to obtain secondary to aphasia  Objective: Vital Signs: Blood pressure 135/85, pulse 62, temperature 98 F (36.7 C), temperature source Oral, resp. rate 18, weight 121.6 kg (268 lb 1.3 oz), SpO2 98.00%. No results found. Results for orders placed during the hospital encounter of 12/17/13 (from the past 72 hour(s))  GLUCOSE, CAPILLARY     Status: None   Collection Time    01/02/14  7:15 AM      Result Value Ref Range   Glucose-Capillary 87  70 - 99 mg/dL  GLUCOSE, CAPILLARY     Status: Abnormal   Collection Time    01/02/14 11:43 AM      Result Value Ref Range   Glucose-Capillary 111 (*) 70 - 99 mg/dL  GLUCOSE, CAPILLARY     Status: None   Collection Time    01/02/14  4:25 PM      Result Value Ref Range   Glucose-Capillary 91  70 - 99 mg/dL  GLUCOSE, CAPILLARY     Status: Abnormal   Collection Time    01/02/14  9:03 PM      Result Value Ref Range   Glucose-Capillary 100 (*) 70 - 99 mg/dL  GLUCOSE, CAPILLARY     Status: None   Collection Time    01/03/14  7:14 AM      Result Value Ref Range   Glucose-Capillary 83  70 - 99 mg/dL   Comment 1 Notify RN    GLUCOSE, CAPILLARY     Status: Abnormal   Collection Time    01/03/14 11:15 AM      Result Value Ref Range   Glucose-Capillary 168 (*) 70 - 99 mg/dL   Comment 1 Notify RN    GLUCOSE, CAPILLARY     Status: Abnormal   Collection Time    01/03/14  4:26 PM      Result Value Ref Range    Glucose-Capillary 112 (*) 70 - 99 mg/dL  GLUCOSE, CAPILLARY     Status: None   Collection Time    01/03/14  8:47 PM      Result Value Ref Range   Glucose-Capillary 88  70 - 99 mg/dL  GLUCOSE, CAPILLARY     Status: None   Collection Time    01/04/14  7:17 AM      Result Value Ref Range   Glucose-Capillary 82  70 - 99 mg/dL   Comment 1 Notify RN    BASIC METABOLIC PANEL     Status: Abnormal   Collection Time    01/04/14  7:27 AM      Result Value Ref Range   Sodium 142  137 - 147 mEq/L   Potassium 3.7  3.7 - 5.3 mEq/L   Chloride 102  96 - 112 mEq/L   CO2 26  19 - 32 mEq/L   Glucose, Bld 85  70 - 99 mg/dL   BUN 14  6 - 23 mg/dL   Creatinine, Ser 1.08  0.50 - 1.35 mg/dL  Calcium 9.3  8.4 - 10.5 mg/dL   GFR calc non Af Amer 84 (*) >90 mL/min   GFR calc Af Amer >90  >90 mL/min   Comment: (NOTE)     The eGFR has been calculated using the CKD EPI equation.     This calculation has not been validated in all clinical situations.     eGFR's persistently <90 mL/min signify possible Chronic Kidney     Disease.  GLUCOSE, CAPILLARY     Status: Abnormal   Collection Time    01/04/14 11:33 AM      Result Value Ref Range   Glucose-Capillary 109 (*) 70 - 99 mg/dL   Comment 1 Notify RN    GLUCOSE, CAPILLARY     Status: Abnormal   Collection Time    01/04/14  4:23 PM      Result Value Ref Range   Glucose-Capillary 152 (*) 70 - 99 mg/dL   Comment 1 Notify RN    GLUCOSE, CAPILLARY     Status: Abnormal   Collection Time    01/04/14  9:21 PM      Result Value Ref Range   Glucose-Capillary 106 (*) 70 - 99 mg/dL      Mood and affect are appropriate  Heart: Regular rate and rhythm no rubs murmurs or extra sounds  Lungs: Clear to auscultation, breathing unlabored, no rales or wheezes  Abdomen: Positive bowel sounds, soft nontender to palpation, nondistended  Extremities: No clubbing, cyanosis, or edema  Skin: No evidence of breakdown except small area on L forearm, no evidence of rash   Neurologic: Cranial nerves II through XII intact, motor strength is 5/5 in Left deltoid, bicep, tricep, grip, hip flexor, knee extensors, ankle dorsiflexor and plantar flexor  3 minus/5 in the right biceps triceps and deltoid 4 minus at grip, 4 minus right hip flexor knee extensor ankle dorsiflexor plantar flexor  Decreased sensation in Right hand Speech reduced verbal output, able to follow basic commands Oriented to person, place   Assessment/Plan: 1. Functional deficits secondary to Left CR, Lentiform nucleus, temporal infarct which require 3+ hours per day of interdisciplinary therapy in a comprehensive inpatient rehab setting. Physiatrist is providing close team supervision and 24 hour management of active medical problems listed below. Physiatrist and rehab team continue to assess barriers to discharge/monitor patient progress toward functional and medical goals.  FIM: FIM - Bathing Bathing Steps Patient Completed: Chest;Right Arm;Abdomen;Front perineal area;Left upper leg;Right lower leg (including foot);Left lower leg (including foot);Right upper leg Bathing: 4: Min-Patient completes 8-9 50f10 parts or 75+ percent  FIM - Upper Body Dressing/Undressing Upper body dressing/undressing steps patient completed: Thread/unthread right sleeve of pullover shirt/dresss;Put head through opening of pull over shirt/dress;Pull shirt over trunk Upper body dressing/undressing: 4: Min-Patient completed 75 plus % of tasks FIM - Lower Body Dressing/Undressing Lower body dressing/undressing steps patient completed: Thread/unthread right underwear leg;Thread/unthread right pants leg;Thread/unthread left pants leg Lower body dressing/undressing: 2: Max-Patient completed 25-49% of tasks  FIM - Toileting Toileting steps completed by patient: Performs perineal hygiene Toileting Assistive Devices: Grab bar or rail for support Toileting: 1: Total-Patient completed zero steps, helper did all 3  FIM -  TRadio producerDevices: BRecruitment consultantTransfers: 4-To toilet/BSC: Min A (steadying Pt. > 75%);4-From toilet/BSC: Min A (steadying Pt. > 75%)  FIM - Bed/Chair Transfer Bed/Chair Transfer Assistive Devices: Arm rests;Walker Bed/Chair Transfer: 4: Chair or W/C > Bed: Min A (steadying Pt. > 75%)  FIM - Locomotion:  Wheelchair Distance: 100 Locomotion: Wheelchair: 1: Total Assistance/staff pushes wheelchair (Pt<25%) FIM - Locomotion: Ambulation Locomotion: Ambulation Assistive Devices: Lite Gait Ambulation/Gait Assistance: 1: +1 Total assist Locomotion: Ambulation: 1: Travels 150 ft or more with total assistance/helper does all (Pt.<25%)  Comprehension Comprehension Mode: Auditory Comprehension: 5-Understands basic 90% of the time/requires cueing < 10% of the time  Expression Expression Mode: Verbal Expression: 4-Expresses basic 75 - 89% of the time/requires cueing 10 - 24% of the time. Needs helper to occlude trach/needs to repeat words.  Social Interaction Social Interaction: 5-Interacts appropriately 90% of the time - Needs monitoring or encouragement for participation or interaction.  Problem Solving Problem Solving: 4-Solves basic 75 - 89% of the time/requires cueing 10 - 24% of the time  Memory Memory: 4-Recognizes or recalls 75 - 89% of the time/requires cueing 10 - 24% of the time   Medical Problem List and Plan:  1. Functional deficits secondary to embolic infarcts left corona radiata, left lentiform nucleus, left temporal lobe with R Hemiparesis and aphasia  2. DVT Prophylaxis/Anticoagulation: Pharmaceutical: Lovenox  3. Pain Management: Tylenol 650 mg every 4 hours when necessary  4. Mood: Monitor for post stroke depression , flat affect, trial celexa 5. Neuropsych: This patient is not capable of making decisions on his own behalf.  6. HTN: 7. DM type 2: controlled on  amaryl , monitor for hypoglycemia 8. H/O Seizure disorder: only  on gabapentin 9.  Acute renal insufficiency, suspect ACE-I, HCTZ  Resolved on lower doses  LOS (Days) 19 A FACE TO FACE EVALUATION WAS PERFORMED  KIRSTEINS,ANDREW E 01/05/2014, 6:23 AM

## 2014-01-05 NOTE — Progress Notes (Signed)
Occupational Therapy Session Note  Patient Details  Name: [name] MRN: [id number] Date of Birth: 1970/10/30  Today's Date: 01/05/2014 Time: 8185-6314 Time Calculation (min): 45 min   Skilled Therapeutic Interventions/Progress Updates:    Patient seen this am for OT intervention to address family education with bathing and dressing.  Patient's brother [name] demonstrated safe performance assisting patient to sponge bathe and dress.  Patient's brother responded well to cues for technique to keep patient safe and allow patient to perform tasks as independently as possible, allowing increased time, expectation, balance, and use of right UE.    Therapy Documentation Precautions:  Precautions Precautions: Fall Required Braces or Orthoses: Other Brace/Splint Other Brace/Splint: R AFO. Wear for 1 consecutive hour; remove after each therapy session to minimize risk of skin breakdown. Restrictions Weight Bearing Restrictions: No General: General Missed Time Reason: Other (comment) (Toileting)   Pain: Pain Assessment Pain Assessment: No/denies pain Pain Score: 0-No pain    See FIM for current functional status  Therapy/Group: Individual Therapy  Collier Salina 01/05/2014, 11:07 AM

## 2014-01-05 NOTE — Progress Notes (Signed)
Physical Therapy Weekly Progress Note  Patient Details  Name: [name] MRN: [id number] Date of Birth: October 10, 1970  Beginning of progress report period: December 29, 2013 End of progress report period: January 05, 2014  Today's Date: 01/05/2014 Time: 1445-1530 Time Calculation (min): 45 min  Patient has met 1 and has partly met 2 of 4 short term goals. Pt able to perform squat pivot transfers with min A but frequently required mod A for stand pivot transfers. Pt consistently requires mod A with gait x50'. Min-Mod A required with dynamic standing balance.  Patient continues to demonstrate the following deficits: muscle weakness, impaired timing and sequencing, abnormal tone, unbalanced muscle activation, motor apraxia, ataxia, decreased attention to right, decreased memory and therefore will continue to benefit from skilled PT intervention to enhance overall performance with activity tolerance, balance, postural control, ability to compensate for deficits, functional use of  right upper extremity and right lower extremity, awareness and coordination.  Patient progressing toward long term goals..  Plan of care revisions: reactivated long term goal addressing stair negotiation secondary to information obtained regarding D/C destination; downgraded LTG addressing dynamic standing balance..  PT Short Term Goals Week 2:  PT Short Term Goal 1 (Week 2): Pt will perform bed<>chair transfer with min A. PT Short Term Goal 1 - Progress (Week 2): Partly met PT Short Term Goal 2 (Week 2): Pt will perform gait x50' in controlled environment using LRAD with min A x1. PT Short Term Goal 2 - Progress (Week 2): Revised due to lack of progress PT Short Term Goal 3 (Week 2): Pt will perform w/c mobility x150' in controlled environment with supervision. PT Short Term Goal 3 - Progress (Week 2): Met PT Short Term Goal 4 (Week 2): Pt will perform dynamic standing balance x1 minute with min A. PT Short Term Goal 4 -  Progress (Week 2): Partly met Week 3:  PT Short Term Goal 1 (Week 3): STG's = LTG's secondary to anticipated LOS  Skilled Therapeutic Interventions/Progress Updates:    Pt received seated in w/c with brother present; agreeable to therapy. Donned (recently modified) custom R AFO. Session focused on hands-on family training with brother, [name]. Performed gait x50' in controlled environment with rolling walker, R hand orthosis and min-mod A, manual facilitation of L weight shift; this PT provided assist during initial 25'; brother effectively provided assist during final 25'. Performed squat pivot transfer from w/c<>simulated with min A. Explained, demonstrated w/c parts management and breakdown of w/c; brother with effective return demonstration.   Remainder of session focused on stair negotiation. Per report of brother, pt/brother will be living at cousin's house, which has 3 steps to enter. Brother unsure if steps have rails but feels fairly certain there is one rail. Due to uncertainty as to which [name] rail is on, PT demonstrated safe assist with both scenarios: negotiation of 3 steps with LUE at L rail and mod A, ascending forward and descending backward; and negotiation of 3 steps with laterally with LUE at R rail with max A. Brother expresses feeling comfortable assisting only with forward-facing technique using L rail; therefore brother safely provided mod A for negotiation of 3 steps with L rail as described above. PT recommended that brother contact cousin for detailed information of home entrance and return Friday to complete training; brother verbally agreed. CSW [name] notified.  During this session, pt reporting no discomfort, exhibiting no R genu recurvatum while wearing R AFO. All education complete, with exception of stair negotiation, pending  brother report of home setup at D/C. Session ended in pt room, where pt was left seated in w/c with brother present and all needs within reach.  Therapy  Documentation Precautions:  Precautions Precautions: Fall Required Braces or Orthoses: Other Brace/Splint Other Brace/Splint: R AFO. Wear for 1 consecutive hour; remove after each therapy session to minimize risk of skin breakdown. Restrictions Weight Bearing Restrictions: No Pain: Pain Assessment Pain Assessment: No/denies pain  See FIM for current functional status  Therapy/Group: Individual Therapy  [name], [name] 01/05/2014, 8:33 PM

## 2014-01-05 NOTE — Progress Notes (Signed)
[facility]  Wound check for implantable loop recorder.  Wound well healed.  Steri-strips removed.  Pt advised on monitor use once discharged to home. We will follow device remotely to monitor for atrial fibrillation.   Gypsy Balsam, RN, BSN 01/05/2014 9:32 AM

## 2014-01-06 ENCOUNTER — Inpatient Hospital Stay (HOSPITAL_COMMUNITY): Payer: Medicare Other | Admitting: Speech Pathology

## 2014-01-06 ENCOUNTER — Inpatient Hospital Stay (HOSPITAL_COMMUNITY): Payer: Medicare Other | Admitting: Rehabilitation

## 2014-01-06 ENCOUNTER — Encounter (HOSPITAL_COMMUNITY): Payer: Medicare Other

## 2014-01-06 ENCOUNTER — Inpatient Hospital Stay (HOSPITAL_COMMUNITY): Payer: Medicare Other | Admitting: Physical Therapy

## 2014-01-06 DIAGNOSIS — G8110 Spastic hemiplegia affecting unspecified side: Secondary | ICD-10-CM

## 2014-01-06 DIAGNOSIS — I6330 Cerebral infarction due to thrombosis of unspecified cerebral artery: Secondary | ICD-10-CM

## 2014-01-06 LAB — GLUCOSE, CAPILLARY
GLUCOSE-CAPILLARY: 96 mg/dL (ref 70–99)
GLUCOSE-CAPILLARY: 154 mg/dL — AB (ref 70–99)
Glucose-Capillary: 128 mg/dL — ABNORMAL HIGH (ref 70–99)
Glucose-Capillary: 74 mg/dL (ref 70–99)

## 2014-01-06 NOTE — Progress Notes (Signed)
Physical Therapy Session Note  Patient Details  Name: [name] MRN: [id number] Date of Birth: [birth year]  Today's Date: 01/06/2014 Time: 1517-1600 Time Calculation (min): 43 min  Short Term Goals: Week 2:  PT Short Term Goal 1 (Week 2): Pt will perform bed<>chair transfer with min A. PT Short Term Goal 1 - Progress (Week 2): Partly met PT Short Term Goal 2 (Week 2): Pt will perform gait x50' in controlled environment using LRAD with min A x1. PT Short Term Goal 2 - Progress (Week 2): Revised due to lack of progress PT Short Term Goal 3 (Week 2): Pt will perform w/c mobility x150' in controlled environment with supervision. PT Short Term Goal 3 - Progress (Week 2): Met PT Short Term Goal 4 (Week 2): Pt will perform dynamic standing balance x1 minute with min A. PT Short Term Goal 4 - Progress (Week 2): Partly met  Skilled Therapeutic Interventions/Progress Updates:   Pt received sitting in w/c in room, having just got meds from RN, agreeable to therapy.  Assisted with donning R AFO prior to session.  Note increased difficulty with donning, however did get foot>brace>shoe.  Re-educated on wear schedule, as pt unable to recall how many hours per day to wear.  Skilled session focused on w/c mobility >300' in controlled, home and simulated community environment (nursing station, day room) at S level with min cues for attention to RUE and utilizing LUE.  Also performed bed mobility in ADL apt to better simulate home.  Performed at Mod I level, no cues needed for safety.  Transferred w/c<> bed via stand pivot at min A level with min cues for safety and stepping sequence.  Pt sat at table in day room to partake in 4th of July party while utilizing RUE to stabilize cup while he ate snack.  Pt assisted remainder of distance back to room and left in w/c with quick release belt donned and all needs in reach.   Therapy Documentation Precautions:  Precautions Precautions: Fall Required Braces or  Orthoses: Other Brace/Splint Other Brace/Splint: R AFO. Wear for 1 consecutive hour; remove after each therapy session to minimize risk of skin breakdown. Restrictions Weight Bearing Restrictions: No   Vital Signs: Therapy Vitals Temp: 98 F (36.7 C) Temp src: Oral Pulse Rate: 67 Resp: 17 BP: 132/67 mmHg Patient Position (if appropriate): Sitting Oxygen Therapy SpO2: 100 % O2 Device: None (Room air) Pain:Pt with no c/o pain during session.    Locomotion : Wheelchair Mobility Distance: 200   See FIM for current functional status  Therapy/Group: Individual Therapy  Denice Bors 01/06/2014, 4:20 PM

## 2014-01-06 NOTE — Plan of Care (Signed)
Problem: RH BOWEL ELIMINATION Goal: RH STG MANAGE BOWEL WITH ASSISTANCE STG Manage Bowel with Assistance.Max A  Outcome: Not Progressing LBM 01-03-14 Goal: RH STG MANAGE BOWEL W/MEDICATION W/ASSISTANCE STG Manage Bowel with Medication with Assistance.Max A  Outcome: Not Progressing LBM 01-03-14

## 2014-01-06 NOTE — Progress Notes (Signed)
Speech Language Pathology Daily Session Note  Patient Details  Name: SHLOIMY BOREN MRN: [id number] Date of Birth: [birth year]  Today's Date: 01/06/2014 Time: 2446-9507 Time Calculation (min): 59 min  Short Term Goals: Week 3: SLP Short Term Goal 1 (Week 3): Pt will demonstrate effective mastication of regular textures with Mod I without overt s/s of aspiration.  SLP Short Term Goal 2 (Week 3): Pt will attend to the right field of environment during functional tasks with modified independence.  SLP Short Term Goal 3 (Week 3): Pt will self-monitor and correct verbal errors with supervision multimodal cues.  SLP Short Term Goal 4 (Week 3): Pt will demonstrate functional problem solving for basic and familiar tasks with supervision mulimodal cues.  SLP Short Term Goal 5 (Week 3): Pt will improve will recall of daily events and information for 80% accuracy with min assist cuing  Skilled Therapeutic Interventions:  Pt was seen for skilled speech therapy targeting dysphagia management and verbal expression.  SLP completed skilled observations during trials of upgraded regular solid consistencies with pt exhibiting minimal right pocketing of residual solids, good awareness and problem solving for use of compensatory strategies to correct pocketing.  No overt s/s of aspiration with solids alone or with mixed solid and liquid consistencies.  Suspect that pt will be ready for a diet upgrade to regular consistencies, but will defer upgrade at this time to home health speech therapist as pt has not been observed with an entire meal of regular solids at this time and it is lilkely that his pocketing may increase over the course of a meal secondary to oral-facial weakness.  SLP engaged pt in functional conversations related to discharge with supported conversation techniques to maximize his functional independence for communication.  Pt benefited from min question/instructional cues and increased processing time when  discussing follow up therapy following discharge and was noted to initiate questioning related to his care at home.  Pt required min question cues to indicate toileting needs which SLP suspects was related to urgency versus decreased initiation for communication.  Pt is ready for d/c tomorrow.    FIM:  Comprehension Comprehension Mode: Auditory Comprehension: 5-Understands basic 90% of the time/requires cueing < 10% of the time Expression Expression Mode: Verbal Expression: 4-Expresses basic 75 - 89% of the time/requires cueing 10 - 24% of the time. Needs helper to occlude trach/needs to repeat words. Social Interaction Social Interaction: 5-Interacts appropriately 90% of the time - Needs monitoring or encouragement for participation or interaction. Problem Solving Problem Solving: 4-Solves basic 75 - 89% of the time/requires cueing 10 - 24% of the time Memory Memory: 4-Recognizes or recalls 75 - 89% of the time/requires cueing 10 - 24% of the time  Pain Pain Assessment Pain Assessment: No/denies pain  Therapy/Group: Individual Therapy  Jackalyn Lombard, M.A. CCC-SLP  Yul Diana, [name] 01/06/2014, 4:23 PM

## 2014-01-06 NOTE — Progress Notes (Signed)
Social Work Patient ID: [name], male   DOB: [birth year], 43 y.o.   MRN: [id number] Met with pt who reports unsure if brother and he have an actual place to go upon discharge.  Discussed if he does not the other option is short term NHP. He could go until his brother has a place for them.  At times unsure if brother is providing all of the information and with holding some.  Pt wants to go home with Brother instead of a NH, but will wait and see when brother here tomorrow.  Worker does not have a phone number for cousin so can not call and confirm plan. Pt and brother also do not have a phone.  Will see tomorrow am, will plan as if pt going home.  Have added a home health SW to monitor the home situation.

## 2014-01-06 NOTE — Progress Notes (Signed)
Physical Therapy Session Note  Patient Details  Name: [name] MRN: [id number] Date of Birth: September 12, 1970  Today's Date: 01/06/2014 Time: 7972-8206 Time Calculation (min): 45 min  Short Term Goals: Week 3:  PT Short Term Goal 1 (Week 3): STG's = LTG's secondary to anticipated LOS  Skilled Therapeutic Interventions/Progress Updates:    Pt received seated in w/c; agreeable to therapy. Session focused on functional transfers, gait, and stair negotiation. Donned Teds, R AFO, and shoes. Performed multiple sit<>stand transfers from w/c with rolling walker and min A. Performed gait x60' in controlled environment with rolling walker, R hand orthosis, and min-mod A., manual facilitation of L weight shift. Negotiated 6 stairs with L rail, step-to pattern, and mod A; facing forward to ascend, backward to descend. Pt reporting w/c ramp to enter cousin's home (D/C destination). CSW notified. Will follow up with brother. Doffed R AFO to decrease pressure, minimize risk of skin breakdown. No areas of increased redness noted with inspection of RLE. While wearing AFO, pt reports no discomfort and exhibits no genu recurvatum. Session ended in pt room, where pt was left seated in w/c with quick release belt on for safety and all needs within reach.   Therapy Documentation Precautions:  Precautions Precautions: Fall Required Braces or Orthoses: Other Brace/Splint Other Brace/Splint: R AFO. Wear for 1 consecutive hour; remove after each therapy session to minimize risk of skin breakdown. Restrictions Weight Bearing Restrictions: No Pain: Pain Assessment Pain Assessment: 0-10 Pain Score: 0-No pain  See FIM for current functional status  Therapy/Group: Individual Therapy  [name], Lorenda Ishihara 01/06/2014, 1:14 PM

## 2014-01-06 NOTE — Progress Notes (Signed)
Subjective/Complaints: 43 year old male with history of DM type 2, seizure disorder, HTN, prior CVA who was admitted to [facility] on 12/14/13 with weakness X 2 days and difficulty talking. Patient had been out of BP medications X 2 1/2 months. CT head with stable hydrocephalus. MRI brain done revealing acute infarct left corona radiata, lentiform nuclei, left posterior temporal lobe and chronic severe ventriculomegaly/hydrocephalus  No bowel or bladder issues, no breathing problem, no pain c/os, brother will come back in am  Review of Systems - unable to obtain secondary to aphasia  Objective: Vital Signs: Blood pressure 152/90, pulse 72, temperature 97.7 F (36.5 C), temperature source Oral, resp. rate 18, weight 124 kg (273 lb 5.9 oz), SpO2 100.00%. No results found. Results for orders placed during the hospital encounter of 12/17/13 (from the past 72 hour(s))  GLUCOSE, CAPILLARY     Status: None   Collection Time    01/03/14  7:14 AM      Result Value Ref Range   Glucose-Capillary 83  70 - 99 mg/dL   Comment 1 Notify RN    GLUCOSE, CAPILLARY     Status: Abnormal   Collection Time    01/03/14 11:15 AM      Result Value Ref Range   Glucose-Capillary 168 (*) 70 - 99 mg/dL   Comment 1 Notify RN    GLUCOSE, CAPILLARY     Status: Abnormal   Collection Time    01/03/14  4:26 PM      Result Value Ref Range   Glucose-Capillary 112 (*) 70 - 99 mg/dL  GLUCOSE, CAPILLARY     Status: None   Collection Time    01/03/14  8:47 PM      Result Value Ref Range   Glucose-Capillary 88  70 - 99 mg/dL  GLUCOSE, CAPILLARY     Status: None   Collection Time    01/04/14  7:17 AM      Result Value Ref Range   Glucose-Capillary 82  70 - 99 mg/dL   Comment 1 Notify RN    BASIC METABOLIC PANEL     Status: Abnormal   Collection Time    01/04/14  7:27 AM      Result Value Ref Range   Sodium 142  137 - 147 mEq/L   Potassium 3.7  3.7 - 5.3 mEq/L   Chloride 102  96 - 112 mEq/L   CO2 26  19 - 32 mEq/L    Glucose, Bld 85  70 - 99 mg/dL   BUN 14  6 - 23 mg/dL   Creatinine, Ser 1.08  0.50 - 1.35 mg/dL   Calcium 9.3  8.4 - 10.5 mg/dL   GFR calc non Af Amer 84 (*) >90 mL/min   GFR calc Af Amer >90  >90 mL/min   Comment: (NOTE)     The eGFR has been calculated using the CKD EPI equation.     This calculation has not been validated in all clinical situations.     eGFR's persistently <90 mL/min signify possible Chronic Kidney     Disease.  GLUCOSE, CAPILLARY     Status: Abnormal   Collection Time    01/04/14 11:33 AM      Result Value Ref Range   Glucose-Capillary 109 (*) 70 - 99 mg/dL   Comment 1 Notify RN    GLUCOSE, CAPILLARY     Status: Abnormal   Collection Time    01/04/14  4:23 PM        Result Value Ref Range   Glucose-Capillary 152 (*) 70 - 99 mg/dL   Comment 1 Notify RN    GLUCOSE, CAPILLARY     Status: Abnormal   Collection Time    01/04/14  9:21 PM      Result Value Ref Range   Glucose-Capillary 106 (*) 70 - 99 mg/dL  GLUCOSE, CAPILLARY     Status: None   Collection Time    01/05/14  7:13 AM      Result Value Ref Range   Glucose-Capillary 96  70 - 99 mg/dL   Comment 1 Notify RN    GLUCOSE, CAPILLARY     Status: Abnormal   Collection Time    01/05/14 11:18 AM      Result Value Ref Range   Glucose-Capillary 148 (*) 70 - 99 mg/dL   Comment 1 Notify RN    GLUCOSE, CAPILLARY     Status: Abnormal   Collection Time    01/05/14  4:12 PM      Result Value Ref Range   Glucose-Capillary 181 (*) 70 - 99 mg/dL   Comment 1 Notify RN    GLUCOSE, CAPILLARY     Status: Abnormal   Collection Time    01/05/14 10:00 PM      Result Value Ref Range   Glucose-Capillary 132 (*) 70 - 99 mg/dL      Mood and affect are appropriate  Heart: Regular rate and rhythm no rubs murmurs or extra sounds  Lungs: Clear to auscultation, breathing unlabored, no rales or wheezes  Abdomen: Positive bowel sounds, soft nontender to palpation, nondistended  Extremities: No clubbing, cyanosis, or edema   Skin: No evidence of breakdown except small area on L forearm, no evidence of rash  Neurologic: Cranial nerves II through XII intact, motor strength is 5/5 in Left deltoid, bicep, tricep, grip, hip flexor, knee extensors, ankle dorsiflexor and plantar flexor  3 minus/5 in the right biceps triceps and deltoid 4 minus at grip, 4 minus right hip flexor knee extensor ankle dorsiflexor plantar flexor  Decreased sensation in Right hand Speech reduced verbal output, able to follow basic commands Oriented to person, place   Assessment/Plan: 1. Functional deficits secondary to Left CR, Lentiform nucleus, temporal infarct which require 3+ hours per day of interdisciplinary therapy in a comprehensive inpatient rehab setting. Physiatrist is providing close team supervision and 24 hour management of active medical problems listed below. Physiatrist and rehab team continue to assess barriers to discharge/monitor patient progress toward functional and medical goals. Stable for D/C in am FIM: FIM - Bathing Bathing Steps Patient Completed: Chest;Right Arm;Abdomen;Front perineal area;Left upper leg;Right lower leg (including foot);Left lower leg (including foot);Right upper leg Bathing: 4: Min-Patient completes 8-9 0f 10 parts or 75+ percent  FIM - Upper Body Dressing/Undressing Upper body dressing/undressing steps patient completed: Put head through opening of pull over shirt/dress;Pull shirt over trunk;Thread/unthread left sleeve of pullover shirt/dress;Thread/unthread right sleeve of pullover shirt/dresss Upper body dressing/undressing: 5: Supervision: Safety issues/verbal cues FIM - Lower Body Dressing/Undressing Lower body dressing/undressing steps patient completed: Thread/unthread right pants leg;Thread/unthread left pants leg;Don/Doff right shoe;Don/Doff left shoe;Pull pants up/down Lower body dressing/undressing: 3: Mod-Patient completed 50-74% of tasks  FIM - Toileting Toileting steps completed by  patient: Performs perineal hygiene Toileting Assistive Devices: Grab bar or rail for support Toileting: 1: Total-Patient completed zero steps, helper did all 3  FIM - Toilet Transfers Toilet Transfers Assistive Devices: Grab bars Toilet Transfers: 2-To toilet/BSC: Max A (lift and lower assist);3-From   toilet/BSC: Mod A (lift or lower assist)  FIM - Bed/Chair Transfer Bed/Chair Transfer Assistive Devices: Arm rests;Orthosis;Walker Bed/Chair Transfer: 3: Bed > Chair or W/C: Mod A (lift or lower assist);3: Chair or W/C > Bed: Mod A (lift or lower assist)  FIM - Locomotion: Wheelchair Distance: 150 Locomotion: Wheelchair: 5: Travels 150 ft or more: maneuvers on rugs and over door sills with supervision, cueing or coaxing FIM - Locomotion: Ambulation Locomotion: Ambulation Assistive Devices: Walker - Rolling;Orthosis;Other (comment) (R AFO, R hand orthosis) Ambulation/Gait Assistance: 3: Mod assist;4: Min assist Locomotion: Ambulation: 2: Travels 50 - 149 ft with moderate assistance (Pt: 50 - 74%)  Comprehension Comprehension Mode: Auditory Comprehension: 5-Understands basic 90% of the time/requires cueing < 10% of the time  Expression Expression Mode: Verbal Expression: 4-Expresses basic 75 - 89% of the time/requires cueing 10 - 24% of the time. Needs helper to occlude trach/needs to repeat words.  Social Interaction Social Interaction: 5-Interacts appropriately 90% of the time - Needs monitoring or encouragement for participation or interaction.  Problem Solving Problem Solving: 4-Solves basic 75 - 89% of the time/requires cueing 10 - 24% of the time  Memory Memory: 4-Recognizes or recalls 75 - 89% of the time/requires cueing 10 - 24% of the time   Medical Problem List and Plan:  1. Functional deficits secondary to embolic infarcts left corona radiata, left lentiform nucleus, left temporal lobe with R Hemiparesis and aphasia  2. DVT Prophylaxis/Anticoagulation: Pharmaceutical:  Lovenox  3. Pain Management: Tylenol 650 mg every 4 hours when necessary  4. Mood: Monitor for post stroke depression , flat affect, trial celexa 5. Neuropsych: This patient is not capable of making decisions on his own behalf.  6. HTN:BP up today but generally in good range7. DM type 2: controlled on  amaryl , monitor for hypoglycemia 8. H/O Seizure disorder: only on gabapentin 9.  Acute renal insufficiency, suspect ACE-I, HCTZ  Resolved on lower doses  LOS (Days) 20 A FACE TO FACE EVALUATION WAS PERFORMED  KIRSTEINS,ANDREW E 01/06/2014, 6:47 AM

## 2014-01-06 NOTE — Progress Notes (Signed)
Occupational Therapy Session Note  Patient Details  Name: [name] MRN: [id number] Date of Birth: 01-24-1971  Today's Date: 01/06/2014 Time: 1032-1120 Time Calculation (min): 48 min  Short Term Goals: Week 3:  OT Short Term Goal 1 (Week 3): Patient will transfer to toilet with min assist OT Short Term Goal 2 (Week 3): Patient will transfer to shower with min assist OT Short Term Goal 3 (Week 3): Patient will dress lower body with min assist  Skilled Therapeutic Interventions/Progress Updates: ADL-retraining with focus on dynamic standing balance, weight-shifting, AE training (shoe buttons), and improved awareness.   Patient received seated in w/c awaiting therapist for continued rehab.  Patient oriented to task and directed selection of clothing in prep for dressing.   With min verbal cues to problem-solve and manage w/c, patient completed bathing at sink, attending to his right UE/LE with therapist providing stabilization at elbow for UE and ankle for LE.   Patient able to cross legs to wash feet with therapist anchoring foot on knee while patient performed bathing and skin care (applied lotion).   Patient completed bathing of per-area and buttocks, using forward lean against sink to stabilize himself with steadying assist by therapist at right trunk and upper torso (hip, shoulder).   Patient able to shift weight from L-LE to R-LE as needed although with intermittent hyperextension of left knee/hip.   OT applied shoe buttons and demonstrated method to don/doff shoes this session; follow-up retraining recommended.    Therapy Documentation Precautions:  Precautions Precautions: Fall Required Braces or Orthoses: Other Brace/Splint Other Brace/Splint: R AFO. Wear for 1 consecutive hour; remove after each therapy session to minimize risk of skin breakdown. Restrictions Weight Bearing Restrictions: No  Vital Signs: Therapy Vitals Pulse Rate: 72 BP: 149/87 mmHg  Pain: Pain  Assessment Pain Assessment: 0-10 Pain Score: 0-No pain  See FIM for current functional status  Therapy/Group: Individual Therapy  [name] 01/06/2014, 11:43 AM

## 2014-01-07 ENCOUNTER — Ambulatory Visit (HOSPITAL_COMMUNITY): Payer: Medicare Other | Admitting: Physical Therapy

## 2014-01-07 LAB — GLUCOSE, CAPILLARY
GLUCOSE-CAPILLARY: 96 mg/dL (ref 70–99)
GLUCOSE-CAPILLARY: 173 mg/dL — AB (ref 70–99)
Glucose-Capillary: 96 mg/dL (ref 70–99)

## 2014-01-07 MED ORDER — CLOPIDOGREL BISULFATE 75 MG PO TABS: 75.0000 mg | ORAL_TABLET | Freq: Every day | ORAL | Status: DC

## 2014-01-07 MED ORDER — SENNOSIDES-DOCUSATE SODIUM 8.6-50 MG PO TABS: 2.0000 | ORAL_TABLET | Freq: Every day | ORAL | Status: DC

## 2014-01-07 MED ORDER — METFORMIN HCL 500 MG PO TABS: 500.0000 mg | ORAL_TABLET | Freq: Two times a day (BID) | ORAL | Status: DC

## 2014-01-07 MED ORDER — ATORVASTATIN CALCIUM 80 MG PO TABS: 80.0000 mg | ORAL_TABLET | Freq: Every day | ORAL | Status: DC

## 2014-01-07 MED ORDER — ASPIRIN 81 MG PO CHEW: 81.0000 mg | CHEWABLE_TABLET | Freq: Every day | ORAL | Status: DC

## 2014-01-07 MED ORDER — LISINOPRIL 2.5 MG PO TABS
2.5000 mg | ORAL_TABLET | Freq: Once | ORAL | Status: AC
  Administered 2014-01-07: 2.5 mg via ORAL
  Filled 2014-01-07: qty 1

## 2014-01-07 MED ORDER — CITALOPRAM HYDROBROMIDE 10 MG PO TABS: 10.0000 mg | ORAL_TABLET | Freq: Every day | ORAL | Status: DC

## 2014-01-07 MED ORDER — HYDROCHLOROTHIAZIDE 10 MG/ML ORAL SUSPENSION
6.2500 mg | Freq: Every day | ORAL | Status: DC
  Filled 2014-01-07 (×2): qty 1.25

## 2014-01-07 MED ORDER — CARVEDILOL 6.25 MG PO TABS: 6.2500 mg | ORAL_TABLET | Freq: Two times a day (BID) | ORAL | Status: DC

## 2014-01-07 MED ORDER — AMLODIPINE BESYLATE 10 MG PO TABS: 10.0000 mg | ORAL_TABLET | Freq: Every day | ORAL | Status: DC

## 2014-01-07 MED ORDER — GLIMEPIRIDE 4 MG PO TABS: 4.0000 mg | ORAL_TABLET | Freq: Every day | ORAL | Status: DC

## 2014-01-07 MED ORDER — LISINOPRIL 5 MG PO TABS
5.0000 mg | ORAL_TABLET | Freq: Every day | ORAL | Status: DC
  Filled 2014-01-07: qty 1

## 2014-01-07 MED ORDER — LISINOPRIL 5 MG PO TABS: 5.0000 mg | ORAL_TABLET | Freq: Every day | ORAL | Status: DC

## 2014-01-07 MED ORDER — GABAPENTIN 300 MG PO CAPS: 300.0000 mg | ORAL_CAPSULE | Freq: Three times a day (TID) | ORAL | Status: DC

## 2014-01-07 NOTE — Progress Notes (Signed)
Speech Language Pathology Discharge Summary  Patient Details  Name: [name] MRN: [id number] Date of Birth: 06/24/1971  Today's Date: 01/07/2014   Patient has met 6 of 6 long term goals.  Patient to discharge at overall Supervision level.  Reasons goals not met:  n/a   Clinical Impression/Discharge Summary:   Patient has made functional gains and has met 6 of 6 long term goals this admission due to improved verbal expression, auditory comprehension, recall of daily events and information, and awareness. Patient is currently an overall supervision assist for basic, familiar cognitive tasks and is modified independent for utilization of swallowing compensatory strategies to minimize overt s/s of aspiration with dys 3 textures and thin liquids. Pt was trialed with regular solids during last therapy session and presented with minimal-mild right pocketing but overall effective oral manipulation and clearance of solids.  Suspect that pt will be ready for a diet upgrade but will defer advancement to SLP at next level of care.  Patient  and family education complete and patient will discharge home with 24/7 supervision from family.  Patient would benefit from home health follow up SLP services to maximize cognitive-linguistic function and swallowing safety in order to maximize his functional independence.   Care Partner:  Caregiver Able to Provide Assistance: Yes  Type of Caregiver Assistance: Physical;Cognitive  Recommendation:  Home Health SLP;24 hour supervision/assistance  Rationale for SLP Follow Up: Maximize swallowing safety;Maximize cognitive function and independence;Maximize functional communication   Equipment: none recommended by SLP   Reasons for discharge: Discharged from hospital    See FIM for current functional status  Windell Moulding, M.A. CCC-SLP  Shirly Bartosiewicz, Selinda Orion 01/07/2014, 4:53 PM

## 2014-01-07 NOTE — Discharge Instructions (Signed)
Inpatient Rehab Discharge Instructions  [name] Discharge date and time:  01/07/14  Activities/Precautions/ Functional Status: Activity: no lifting, driving, or strenuous exercise for for next few months Diet: diabetic diet Wound Care: none needed  Functional status:  ___ No restrictions     ___ Walk up steps independently _X__ 24/7 supervision/assistance   ___ Walk up steps with assistance ___ Intermittent supervision/assistance  ___ Bathe/dress independently ___ Walk with walker     ___ Bathe/dress with assistance ___ Walk Independently    ___ Shower independently _X__ Walk with assistance    _X__ Shower with assistance _X__ No alcohol     ___ Return to work/school ________  Special Instructions:    COMMUNITY REFERRALS UPON DISCHARGE:    Home Health:   PT, OT, SP, RN, SW  Agency:[facility] Phone:[phone] Date of last service:01/07/2014   Medical Equipment/Items Ordered:WHEELCHAIR, TUB BENCH, WIDE Levan HurstROLLING WALKER, [facility]  Agency/Supplier:[facility]   [phone] Other:PCS SERVICES  GENERAL COMMUNITY RESOURCES FOR PATIENT/FAMILY: Support Groups:[facility] SUPPORT GROUP  STROKE/TIA DISCHARGE INSTRUCTIONS SMOKING Cigarette smoking nearly doubles your risk of having a stroke & is the single most alterable risk factor  If you smoke or have smoked in the last 12 months, you are advised to quit smoking for your health.  Most of the excess cardiovascular risk related to smoking disappears within a year of stopping.  Ask you doctor about anti-smoking medications  [place] Quit Line: 1-800-QUIT NOW  Free Smoking Cessation Classes [phone]  CHOLESTEROL Know your levels; limit fat & cholesterol in your diet  Lipid Panel      Many patients benefit from treatment even if their cholesterol is at goal.  Goal: Total Cholesterol (CHOL) less than 160  Goal:  Triglycerides (TRIG) less than 150  Goal:  HDL greater than 40  Goal:  LDL (LDLCALC) less than 100   BLOOD  PRESSURE American Stroke Association blood pressure target is less that 120/80 mm/Hg  Your discharge blood pressure is:  BP: 101/66 mmHg  Monitor your blood pressure  Limit your salt and alcohol intake  Many individuals will require more than one medication for high blood pressure  DIABETES (A1c is a blood sugar average for last 3 months) Goal HGBA1c is under 7% (HBGA1c is blood sugar average for last 3 months)  Diabetes:      HGBA1C     Your HGBA1c can be lowered with medications, healthy diet, and exercise.  Check your blood sugar as directed by your physician  Call your physician if you experience unexplained or low blood sugars.  PHYSICAL ACTIVITY/REHABILITATION Goal is 30 minutes at least 4 days per week  Activity: No driving, Therapies: See above Return to work:  N/A  Activity decreases your risk of heart attack and stroke and makes your heart stronger.  It helps control your weight and blood pressure; helps you relax and can improve your mood.  Participate in a regular exercise program.  Talk with your doctor about the best form of exercise for you (dancing, walking, swimming, cycling).  DIET/WEIGHT Goal is to maintain a healthy weight  Your discharge diet is: Dysphagia thin liquids Your height is:  5\' 10"  Your current weight is: 271 lbs  Your Body Mass Index (BMI) is:  39  Following the type of diet specifically designed for you will help prevent another stroke.  Your goal weight range is:  174 lbs  Your goal Body Mass Index (BMI) is 19-24.  Healthy food habits can help reduce  3 risk factors for stroke:  High cholesterol, hypertension, and excess weight.  RESOURCES Stroke/Support Group:  Call [phone]   STROKE EDUCATION PROVIDED/REVIEWED AND GIVEN TO PATIENT Stroke warning signs and symptoms How to activate emergency medical system (call 911). Medications prescribed at discharge. Need for follow-up after discharge. Personal risk factors for stroke. Pneumonia  vaccine given:  Flu vaccine given:  My questions have been answered, the writing is legible, and I understand these instructions.  I will adhere to these goals & educational materials that have been provided to me after my discharge from the hospital.      My questions have been answered and I understand these instructions. I will adhere to these goals and the provided educational materials after my discharge from the hospital.  Patient/Caregiver Signature _______________________________ Date __________  Clinician Signature _______________________________________ Date __________  Please bring this form and your medication list with you to all your follow-up doctor's appointments.

## 2014-01-07 NOTE — Progress Notes (Signed)
01/07/14 1610  What Happened  Was fall witnessed? No  Was patient injured? No  Patient found on floor  Found by Staff-comment Keturah Barre, RN)  Stated prior activity other (comment) (sitting in wheelchair with qwik release belt on)  Follow Up  MD notified [name], P>A>  Time MD notified [phone]  Family notified Yes-comment (Brother)  Time family notified [phone]  Additional tests No  Simple treatment Other (comment) (none)  Progress note created (see row info) Yes  Adult Fall Risk Assessment  Risk Factor Category (scoring not indicated) High fall risk per protocol (document High fall risk)  Age 43  Fall History: Fall within 6 months prior to admission 5  Elimination; Bowel and/or Urine Incontinence 0  Elimination; Bowel and/or Urine Urgency/Frequency 0  Medications: includes PCA/Opiates, Anti-convulsants, Anti-hypertensives, Diuretics, Hypnotics, Laxatives, Sedatives, and Psychotropics 5  Patient Care Equipment 0  Mobility-Assistance 2  Mobility-Gait 2  Mobility-Sensory Deficit 0  Cognition-Awareness 0  Cognition-Impulsiveness 0  Cognition-Limitations 4  Total Score 18  Patient's Fall Risk High Fall Risk (>13 points)  Adult Fall Risk Interventions  Required Bundle Interventions *See Row Information* High fall risk - low, moderate, and high requirements implemented  Pain Assessment  Pain Assessment 0-10  Pain Score 0  PCA/Epidural/Spinal Assessment  Respiratory Pattern Regular  Neurological  Neuro (WDL) X  Level of Consciousness Alert  Orientation Level Oriented X4  Cognition Follows commands;Memory impairment  Speech Expressive aphasia;Delayed responses  Facial Droop Right  R Hand Grip Weak  L Hand Grip Strong  RUE Motor Response Purposeful movement  LUE Motor Response Purposeful movement  RLE Motor Response Purposeful movement  LLE Motor Response Purposeful movement  Musculoskeletal  Musculoskeletal (WDL) X  Assistive Device Wheelchair  Generalized Weakness Yes   Weight Bearing Restrictions No  Musculoskeletal Details  RUE Limited movement  RLE Limited movement  RLE Ortho/Supportive Device AFO  Integumentary  Integumentary (WDL) X  Skin Color Appropriate for ethnicity  Skin Condition Dry  Skin Integrity Surgical Incision (see LDA)  Contact Dermatitis Location Buttocks  Contact Dermatitis Location Orientation Right  Contact Dermatitis Intervention Barrier cream

## 2014-01-07 NOTE — Progress Notes (Signed)
Social Work Discharge Note Discharge Note  The overall goal for the admission was met for:   Discharge location: [place]  Length of Stay: Yes-21 DAYS  Discharge activity level: Yes-MIN-MOD WITH STEPS  Home/community participation: Yes  Services provided included: MD, RD, PT, OT, SLP, RN, CM, TR, Pharmacy, Neuropsych and SW  Financial Services: Medicare and Medicaid  Follow-up services arranged: Home Health: [facility]-PT,OT,SP,RN,SW, DME: [facility]-WHEELCHAIR, WIDE ROLLING WALKER, BSC, TUB BENCH and Patient/Family has no preference for HH/DME agencies [place]  Comments (or additional information):FAMILY EDUCATION COMPLETED WITH BROTHER AND FEELS COMFORTABLE WITH.  CONCERN IS FINANCES AND IF BROTHER IS TAKING ADVANTAGE OF PT. NEVER GOT ADDRESS FORM BROTHER AND COUSIN SUPPOSELY DID NOT KNOW HIS ADDRESS-ACCORDING TO ED-RN WHEN PICKED UP AT 6;00 PM-WAS TO CALL BACK WITH IT. TRY TO LOCATE PT.  Patient/Family verbalized understanding of follow-up arrangements: Yes  Individual responsible for coordination of the follow-up plan: JERRY-BROTHER  Confirmed correct DME delivered: Elease Hashimoto 01/07/2014    Elease Hashimoto

## 2014-01-07 NOTE — Progress Notes (Signed)
Subjective/Complaints: 43 year old male with history of DM type 2, seizure disorder, HTN, prior CVA who was admitted to [facility] on 12/14/13 with weakness X 2 days and difficulty talking. Patient had been out of BP medications X 2 1/2 months. CT head with stable hydrocephalus. MRI brain done revealing acute infarct left corona radiata, lentiform nuclei, left posterior temporal lobe and chronic severe ventriculomegaly/hydrocephalus  No bowel or bladder issues, no breathing problem, no pain c/os, brother will come back in am  Review of Systems - unable to obtain secondary to aphasia  Objective: Vital Signs: Blood pressure 152/96, pulse 66, temperature 98 F (36.7 C), temperature source Oral, resp. rate 18, weight 124 kg (273 lb 5.9 oz), SpO2 100.00%. No results found. Results for orders placed during the hospital encounter of 12/17/13 (from the past 72 hour(s))  GLUCOSE, CAPILLARY     Status: Abnormal   Collection Time    01/04/14 11:33 AM      Result Value Ref Range   Glucose-Capillary 109 (*) 70 - 99 mg/dL   Comment 1 Notify RN    GLUCOSE, CAPILLARY     Status: Abnormal   Collection Time    01/04/14  4:23 PM      Result Value Ref Range   Glucose-Capillary 152 (*) 70 - 99 mg/dL   Comment 1 Notify RN    GLUCOSE, CAPILLARY     Status: Abnormal   Collection Time    01/04/14  9:21 PM      Result Value Ref Range   Glucose-Capillary 106 (*) 70 - 99 mg/dL  GLUCOSE, CAPILLARY     Status: None   Collection Time    01/05/14  7:13 AM      Result Value Ref Range   Glucose-Capillary 96  70 - 99 mg/dL   Comment 1 Notify RN    GLUCOSE, CAPILLARY     Status: Abnormal   Collection Time    01/05/14 11:18 AM      Result Value Ref Range   Glucose-Capillary 148 (*) 70 - 99 mg/dL   Comment 1 Notify RN    GLUCOSE, CAPILLARY     Status: Abnormal   Collection Time    01/05/14  4:12 PM      Result Value Ref Range   Glucose-Capillary 181 (*) 70 - 99 mg/dL   Comment 1 Notify RN    GLUCOSE, CAPILLARY      Status: Abnormal   Collection Time    01/05/14 10:00 PM      Result Value Ref Range   Glucose-Capillary 132 (*) 70 - 99 mg/dL  GLUCOSE, CAPILLARY     Status: None   Collection Time    01/06/14  7:28 AM      Result Value Ref Range   Glucose-Capillary 96  70 - 99 mg/dL   Comment 1 Notify RN    GLUCOSE, CAPILLARY     Status: Abnormal   Collection Time    01/06/14 11:22 AM      Result Value Ref Range   Glucose-Capillary 128 (*) 70 - 99 mg/dL   Comment 1 Notify RN    GLUCOSE, CAPILLARY     Status: Abnormal   Collection Time    01/06/14  4:35 PM      Result Value Ref Range   Glucose-Capillary 154 (*) 70 - 99 mg/dL  GLUCOSE, CAPILLARY     Status: None   Collection Time    01/06/14  9:24 PM  Result Value Ref Range   Glucose-Capillary 74  70 - 99 mg/dL  GLUCOSE, CAPILLARY     Status: None   Collection Time    01/07/14  7:14 AM      Result Value Ref Range   Glucose-Capillary 96  70 - 99 mg/dL   Comment 1 Notify RN        Mood and affect are appropriate  Heart: Regular rate and rhythm no rubs murmurs or extra sounds  Lungs: Clear to auscultation, breathing unlabored, no rales or wheezes  Abdomen: Positive bowel sounds, soft nontender to palpation, nondistended  Extremities: No clubbing, cyanosis, or edema  Skin: No evidence of breakdown except small area on L forearm, no evidence of rash  Neurologic: Cranial nerves II through XII intact, motor strength is 5/5 in Left deltoid, bicep, tricep, grip, hip flexor, knee extensors, ankle dorsiflexor and plantar flexor  3 minus/5 in the right biceps triceps and deltoid 4 minus at grip, 4 minus right hip flexor knee extensor ankle dorsiflexor plantar flexor  Decreased sensation in Right hand Speech reduced verbal output, able to follow basic commands Oriented to person, place   Assessment/Plan: 1. Functional deficits secondary to Left CR, Lentiform nucleus, temporal infarct Stable for D/C today F/u PCP in 1-2 weeks F/u PM&R 6  weeks See D/C summary See D/C instructions FIM: FIM - Bathing Bathing Steps Patient Completed: Chest;Right Arm;Abdomen;Front perineal area;Left upper leg;Right lower leg (including foot);Left lower leg (including foot);Right upper leg Bathing: 4: Min-Patient completes 8-9 [id number] 10 parts or 75+ percent  FIM - Upper Body Dressing/Undressing Upper body dressing/undressing steps patient completed: Put head through opening of pull over shirt/dress;Pull shirt over trunk;Thread/unthread left sleeve of pullover shirt/dress;Thread/unthread right sleeve of pullover shirt/dresss Upper body dressing/undressing: 5: Supervision: Safety issues/verbal cues FIM - Lower Body Dressing/Undressing Lower body dressing/undressing steps patient completed: Thread/unthread right pants leg;Thread/unthread left pants leg;Don/Doff right shoe;Don/Doff left shoe;Pull pants up/down Lower body dressing/undressing: 3: Mod-Patient completed 50-74% of tasks  FIM - Toileting Toileting steps completed by patient: Performs perineal hygiene Toileting Assistive Devices: Grab bar or rail for support Toileting: 1: Total-Patient completed zero steps, helper did all 3  FIM - Diplomatic Services operational officerToilet Transfers Toilet Transfers Assistive Devices: Grab bars Toilet Transfers: 2-To toilet/BSC: Max A (lift and lower assist);3-From toilet/BSC: Mod A (lift or lower assist)  FIM - Bed/Chair Transfer Bed/Chair Transfer Assistive Devices: Arm rests;Orthosis;Walker Bed/Chair Transfer: 6: Supine > Sit: No assist;6: Sit > Supine: No assist  FIM - Locomotion: Wheelchair Distance: 200 Locomotion: Wheelchair: 5: Travels 150 ft or more: maneuvers on rugs and over door sills with supervision, cueing or coaxing FIM - Locomotion: Ambulation Locomotion: Ambulation Assistive Devices: Walker - Rolling;Orthosis;Other (comment) (R AFO, R hand orthosis) Ambulation/Gait Assistance: 4: Min assist;3: Mod assist Locomotion: Ambulation: 2: Travels 50 - 149 ft with moderate  assistance (Pt: 50 - 74%)  Comprehension Comprehension Mode: Auditory Comprehension: 5-Understands basic 90% of the time/requires cueing < 10% of the time  Expression Expression Mode: Verbal Expression: 4-Expresses basic 75 - 89% of the time/requires cueing 10 - 24% of the time. Needs helper to occlude trach/needs to repeat words.  Social Interaction Social Interaction: 5-Interacts appropriately 90% of the time - Needs monitoring or encouragement for participation or interaction.  Problem Solving Problem Solving: 4-Solves basic 75 - 89% of the time/requires cueing 10 - 24% of the time  Memory Memory: 4-Recognizes or recalls 75 - 89% of the time/requires cueing 10 - 24% of the time   Medical Problem List  and Plan:  1. Functional deficits secondary to embolic infarcts left corona radiata, left lentiform nucleus, left temporal lobe with R Hemiparesis and aphasia  2. DVT Prophylaxis/Anticoagulation: Pharmaceutical: Lovenox  3. Pain Management: Tylenol 650 mg every 4 hours when necessary  4. Mood: Monitor for post stroke depression , flat affect, trial celexa 5. Neuropsych: This patient is not capable of making decisions on his own behalf.  6. HTN:BP up today but generally in good range7. DM type 2: controlled on  amaryl , monitor for hypoglycemia 8. H/O Seizure disorder: only on gabapentin 9.  Acute renal insufficiency, suspect ACE-I, HCTZ  Resolved on lower doses  LOS (Days) 21 A FACE TO FACE EVALUATION WAS PERFORMED  [name] E 01/07/2014, 7:53 AM

## 2014-01-07 NOTE — Progress Notes (Signed)
Social Work Patient ID: [name], male   DOB: [birth year], 43 y.o.   MRN: [id number] Met with pt to discuss if brother is coming to get him or not.  He is concerned he is trying to access his bank account. Pt's name is the only one on the account therefore his brother can not access it. Concern is will brother provide the care pt requires? Pt still wants to go home and reports his cousin is coming to get him.  Aware if no one comes he will stay here and the next step will be to Work on NHP.  He does not want to go to a NH but wants to have care also.  No one has a phone so can call anyone to see what is going on. Will await pt's ride.

## 2014-01-07 NOTE — Plan of Care (Signed)
Problem: RH Floor Transfers Goal: LTG Patient will perform floor transfers w/assist (PT) LTG: Patient will perform floor transfers with assistance (PT).  Outcome: Completed/Met Date Met:  01/07/14 Not attempted; did not address in physical therapy sessions.

## 2014-01-07 NOTE — Progress Notes (Signed)
Physical Therapy Discharge Summary  Patient Details  Name: [name] MRN: [id number] Date of Birth: [birth year]  Today's Date: 01/07/2014 Time: 0945-1000 (Family education scheduled from 33-1000 but pt's brother not present) Time Calculation (min): 15 min  Patient has met 12 of 13 long term goals due to improved activity tolerance, improved balance, improved postural control, increased strength, ability to compensate for deficits, functional use of  right lower extremity, improved attention, improved awareness and improved coordination.  Patient to discharge at an ambulatory for short distances level Min Assist and at wheelchair level for distances >50'.  Patient's care partner is independent to provide the necessary physical and cognitive assistance at discharge.  Reasons goals not met: Long term goal addressing floor transfer not met as goal was not addressed during pt's rehab stay.  Recommendation:  Patient will benefit from ongoing skilled PT services in home health setting to continue to advance safe functional mobility, address ongoing impairments in stability/independence with functional mobility, and minimize fall risk.  Equipment: w/c (20"x18") and seat cushion, half lap tray, rolling walker (wide), right hand orthosis, and custom right ankle-foot orthotic  Reasons for discharge: treatment goals met and discharge from hospital  Patient/family agrees with progress made and goals achieved: Yes  Skilled Therapeutic Interventions/Progress Updates Pt received semi-reclined in bed; agreeable to therapy. Session planned to focus on family training with brother, [name]; however, brother not present for session. CSW [place] notified.  Session therefore focused on assisting pt with getting out of bed and into w/c. Pt performed supine>sit with HOB flat, no rails with Mod I, increased time. Stand pivot transfer from bed>w/c without walker requiring min guard-min A. Educated pt that brother  will not require additional family training for stair negotiation if pt/brother do find out that pt's cousin's home (D/C destination) does in fact have a ramp to enter. Pt verbalized understanding. Therapist again addressed recommendations for wearing schedule for R AFO to prevent skin breakdown. Pt verbalized understanding and verbally agreed that he feels safe with brother assisting with all aspects of functional mobility. Therapist departed with pt seated in w/c with quick release belt on for safety and all needs within reach.  PT Discharge Precautions/Restrictions Precautions Precautions: Fall Required Braces or Orthoses: Other Brace/Splint Other Brace/Splint: R AFO. Wear for 1 consecutive hour; remove after each therapy session to minimize risk of skin breakdown. Restrictions Weight Bearing Restrictions: No Vital Signs Therapy Vitals Pulse Rate: 89 BP: 151/79 mmHg Pain  Pt reported no pain during PT session. Cognition Overall Cognitive Status: Impaired/Different from baseline Arousal/Alertness: Awake/alert Orientation Level: Oriented X4 Attention: Alternating Sustained Attention: Appears intact Alternating Attention: Appears intact Alternating Attention Impairment: Functional complex Memory: Impaired Memory Impairment: Decreased recall of new information;Decreased short term memory Decreased Short Term Memory: Verbal complex Awareness: Appears intact Problem Solving: Impaired Problem Solving Impairment: Verbal complex;Functional complex Executive Function: Self Monitoring;Self Correcting Self Monitoring: Impaired Self Monitoring Impairment: Verbal complex;Functional complex Self Correcting: Impaired Self Correcting Impairment: Verbal complex Safety/Judgment: Appears intact Sensation Sensation Light Touch: Appears Intact (Bilat LE's.) Coordination Gross Motor Movements are Fluid and Coordinated: No Fine Motor Movements are Fluid and Coordinated: No Heel Shin Test:  Coordination in LLE > RLE. Motor  Motor Motor: Ataxia;Motor apraxia;Hemiplegia Motor - Discharge Observations: Hemiplegia (RUE>RLE); truncal ataxia improved but still present during dynamic standing/gait. During gait (without AFO), pt demonstrates excessive R forefoot supination likely secondary to compensation for lack of R ankle dorsiflexion using great toe extension.  Mobility Bed Mobility Bed Mobility: Supine to  Sit;Sit to Supine;Sitting - Scoot to [employer] of Bed Rolling Right: 6: Modified independent (Device/Increase time) Sitting - Scoot to Edge of Bed: 6: Modified independent (Device/Increase time) Sit to Supine: 6: Modified independent (Device/Increase time);HOB flat Transfers Transfers: Yes Sit to Stand: 4: Min guard;4: Min assist Stand to Sit: 4: Min assist Stand Pivot Transfers: 4: Min assist;Other (comment) (Pt able to perform with and without rolling walker, R hand orthosis) Stand Pivot Transfer Details: Tactile cues for weight shifting Stand Pivot Transfer Details (indicate cue type and reason): Tactile cueing provided for grading of lateral weight shifting Locomotion  Ambulation Ambulation: Yes Ambulation/Gait Assistance: 4: Min assist Ambulation Distance (Feet): 60 Feet Assistive device: Rolling walker;Other (Comment) (R AFO, R hand orthosis) Ambulation/Gait Assistance Details: Tactile cues for weight shifting;Tactile cues for posture Ambulation/Gait Assistance Details: Tactile cueing for upright posture and for grading of lateral weight shifting. Gait Gait: Yes Gait Pattern: Impaired Gait Pattern: Decreased weight shift to right;Step-through pattern;Narrow base of support;Right flexed knee in stance;Lateral trunk lean to right;Lateral trunk lean to left (RLE adduction) Gait velocity: decreased High Level Ambulation High Level Ambulation: Side stepping;Backwards walking Side Stepping: Min A with rolling walker Backwards Walking: Min A with rolling walker Stairs /  Additional Locomotion Stairs: Yes Stairs Assistance: 3: Mod assist Stairs Assistance Details: Tactile cues for sequencing;Verbal cues for sequencing Stairs Assistance Details (indicate cue type and reason): Pt negotiated 6 stairs with L rail and step-to pattern; ascending forward and descending backwards. Verbal/tactile cueing required during 25% of trial for sequencing. Stair Management Technique: One rail Left;Step to pattern;Backwards;Forwards Number of Stairs: 6 Architect: Yes Wheelchair Assistance: 5: Careers information officer: Both lower extermities;Left upper extremity Wheelchair Parts Management: Supervision/cueing;Needs assistance;Other (comment) (Supervision/cueing with mangement of w/c brakes and L leg rest. Assist required for management of all other w/c parts) Distance: 200  Trunk/Postural Assessment  Cervical Assessment Cervical Assessment: Exceptions to [facility] (Lower cervical spine flexion, upper cervical extension) Thoracic Assessment Thoracic Assessment: Exceptions to [facility] (thoracic kyphosis) Lumbar Assessment Lumbar Assessment: Within Functional Limits Postural Control Postural Control: Deficits on evaluation Trunk Control: During dynamic standing, pt demonstrates excessive lateral trunk displacement bilaterally, difficulty grading weight shifting. Righting Reactions: R ankle strategy ineffective with AP/PA balance perturbation. No stepping strategy noted.  Balance Balance Balance Assessed: Yes Static Sitting Balance Static Sitting - Balance Support: No upper extremity supported;Feet supported Static Sitting - Level of Assistance: 6: Modified independent (Device/Increase time) Dynamic Sitting Balance Dynamic Sitting - Balance Support: Feet supported;No upper extremity supported Dynamic Sitting - Level of Assistance: 6: Modified independent (Device/Increase time) Static Standing Balance Static Standing - Balance Support: No upper  extremity supported Static Standing - Level of Assistance: 5: Stand by assistance Dynamic Standing Balance Dynamic Standing - Balance Support: No upper extremity supported;During functional activity Dynamic Standing - Level of Assistance: 4: Min assist Dynamic Standing - Balance Activities: Lateral lean/weight shifting Extremity Assessment  RLE Assessment RLE Assessment: Exceptions to [facility] RLE Strength RLE Overall Strength: Deficits RLE Overall Strength Comments: Grossly WFL with exception of R ankle dorsiflexion, which is accomplished using R great toe extensors. Subtalar inversion 3+/5, eversion 2/5. LLE Assessment LLE Assessment: Within Functional Limits  See FIM for current functional status  Hobble, [name] 01/07/2014, 6:23 PM

## 2014-01-10 NOTE — Discharge Summary (Signed)
Physician Discharge Summary  Patient ID: [name] MRN: [id number] DOB/AGE: 10-17-1970 43 y.o.  Admit date: 12/17/2013 Discharge date: 01/10/2014  Discharge Diagnoses:  Principal Problem:   CVA (cerebral infarction) Active Problems:   HTN (hypertension)   Diabetes   Cardiomyopathy   Discharged Condition: Stable.   Significant Diagnostic Studies: No results found.  Labs:  Basic Metabolic Panel:  Recent Labs Lab 01/04/14 0727  NA 142  K 3.7  CL 102  CO2 26  GLUCOSE 85  BUN 14  CREATININE 1.08  CALCIUM 9.3    CBC: No results found for this basename: WBC, NEUTROABS, HGB, HCT, MCV, PLT,  in the last 168 hours  CBG:  Recent Labs Lab 01/06/14 1635 01/06/14 2124 01/07/14 0714 01/07/14 1116 01/07/14 1703  GLUCAP 154* 74 96 173* 96    Brief HPI:   [name] is a 43 year old male with history of DM type 2, seizure disorder, HTN, prior CVA who was admitted to [facility] on 12/14/13 with weakness X 2 days and difficulty talking. Patient had been out of BP medications X 2 1/2 months.  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 Carotid dopplers with minimal plaque and no significant stenosis.  2D echo with mildly decreased LVF with moderately increased LV posterior wall and EF 40-45%. 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 He was placed on ASA and Plavix per neurology recommendations. TEE done  revealing normal LVF, no thrombi in left atrium, left ventricle or apex but smoke present, negative bubble study therefore Dr. Adrian Blackwater recommended anticoagulation for secondary stroke prevention.  Patient with resultant right sided weakness, mild right inattention, slurred speech and dysphagia. Therapies ongoing and CIR was recommended by rehab team and MD.      Hospital Course: [name] was admitted to rehab 12/17/2013 for inpatient therapies to consist of PT, ST and OT at least three hours five days a week. Past admission physiatrist, therapy team and rehab RN have worked together to provide customized collaborative inpatient rehab. Cardiology was consulted for input on appropriate anticoagulation and recommended placement of event monitor to rule out A Fib and to continue ASA/Plavix for secondary stroke prevention. Coreg was added to help with LV dysfunction as well as HTN.  Loop recorder was placed on 06/15 by Dr. Ladona Ridgel and incision has healed well without S/S of infection.  Blood pressures have been monitored on tid basis and have been well controlled.  Prinzide was decreased due to renal insufficiency due to rise in BUN/Cr to 35/1.8 and metformin was held. Lytes have been monitored along and renal insufficiency has resolved. Diabetes has been monitored with as/hs BS checks and SSI was used till renal status improved. Glipizide was changed to amaryl and BS have improved with resumption of metformin. Celexa was added due to flat affect and concerns of depressed mood. He has had improvement in right hemiparesis, aphasia and overall activity level. he was  fitted with custom R-AFO to help with gait . He has progressed to min assist level and will continue to receive follow up therapies past discharge.  [facility] to provide HHPT, HHOT, HHST, HHRN as well as SW for follow up past discharge.   Rehab course: During patient's stay in rehab weekly team conferences were held to monitor patient's progress, set goals and discuss barriers to discharge. Patient has  had improvement in activity tolerance, balance, postural control, as well as ability to compensate for deficits. He is has had improvement in functional use RUE  and RLE as well as improved awareness.Patient is currently an overall supervision assist for basic, familiar cognitive tasks and is modified  independent for utilization of safe swallow strategies to minimize overt s/s of aspiration with dys 3 textures and thin liquids. He was trialed with regular solids but continues with mild right pocketing therefore dysphagia III diet to continue past discharge. He requires min assist for transfers and for ambulating short distances.  Family education was done with brother regarding all aspects of functional mobility, AFO use as well as supervision due to cognitive needs.     Disposition: 01-Home or Self Care  Diet: Diabetic diet. Soft foods.   Special Instructions: 1. Needs 24 hours supervision/assistance.     Medication List    STOP taking these medications       benazepril-hydrochlorthiazide 20-25 MG per tablet  Commonly known as:  LOTENSIN HCT     glipiZIDE 10 MG 24 hr tablet  Commonly known as:  GLUCOTROL XL      TAKE these medications       amLODipine 10 MG tablet  Commonly known as:  NORVASC  Take 1 tablet (10 mg total) by mouth daily.     aspirin 81 MG chewable tablet  Chew 1 tablet (81 mg total) by mouth daily.     atorvastatin 80 MG tablet  Commonly known as:  LIPITOR  Take 1 tablet (80 mg total) by mouth daily.     carvedilol 6.25 MG tablet  Commonly known as:  COREG  Take 1 tablet (6.25 mg total) by mouth 2 (two) times daily with a meal.     citalopram 10 MG tablet  Commonly known as:  CELEXA  Take 1 tablet (10 mg total) by mouth daily.     clopidogrel 75 MG tablet  Commonly known as:  PLAVIX  Take 1 tablet (75 mg total) by mouth daily with breakfast.     gabapentin 300 MG capsule  Commonly known as:  NEURONTIN  Take 1 capsule (300 mg total) by mouth 3 (three) times daily.     glimepiride 4 MG tablet  Commonly known as:  AMARYL  Take 1 tablet (4 mg total) by mouth daily with breakfast.     lisinopril 5 MG tablet  Commonly known as:  PRINIVIL,ZESTRIL  Take 1 tablet (5 mg total) by mouth daily.     metFORMIN 500 MG tablet  Commonly known as:   GLUCOPHAGE  Take 1 tablet (500 mg total) by mouth 2 (two) times daily with a meal.     senna-docusate 8.6-50 MG per tablet  Commonly known as:  Senokot-S  Take 2 tablets by mouth at bedtime. For constipation           Follow-up Information   Follow up with Erick ColaceKIRSTEINS,ANDREW E, MD On 02/15/2014. (Be there at 11 am for 11:30 am  appointment)    Specialty:  Physical Medicine and Rehabilitation   Contact information:   [address] [place] [address] [phone]       Follow up with Oswaldo ConroyBender, Abby Daneele, MD On 01/20/2014. (APPT @ 3;00 PM)    Contact information:   [address] [place] [phone]       Follow up with Lewayne BuntingGregg Taylor, MD. (They will call in a few weeks regarding removal of event monitor.)  Specialty:  Cardiology   Contact information:   [address] [place] [address] [phone]       Signed: Jacquelynn Cree 01/10/2014, 6:00 PM

## 2014-01-10 NOTE — Progress Notes (Signed)
Recreational Therapy Discharge Summary Patient Details  Name: [name] MRN: [id number] Date of Birth: 1970-09-10 Today's Date: 01/10/2014  Long term goals set: 1  Long term goals met: 1   Comments on progress toward goals: Pt has made great progress toward goal and is supervision-min assist for simple TR tasks seated.  Pt discharged home with family to provide/coordinate 24 hour supervision/assist. Reasons for discharge: discharge from hospital Patient/family agrees with progress made and goals achieved: Yes  Rhena Glace 01/10/2014, 8:00 AM

## 2014-01-14 ENCOUNTER — Telehealth: Payer: Self-pay | Admitting: Internal Medicine

## 2014-01-14 NOTE — Telephone Encounter (Signed)
01-14-14 called pt's h#, d/c, called contact charlene corbett, pt lives in a motel, not sure which one or a phone number, may see his brother, if so will have him try to contact pt and give him our number, pt missed linq wound check due to being in the  hospital, needs to rs under pacer wound ck with device clinic asap, can have 430pm in [place] per [place] on 01-18-14/mt

## 2014-01-17 NOTE — Progress Notes (Signed)
Occupational Therapy Discharge Summary  Patient Details  Name: [name] MRN: [id number] Date of Birth: 05-Sep-1970  Today's Date: 01/17/2014  Patient has met 8 of 9 long term goals due to improved activity tolerance, improved balance, ability to compensate for deficits and improved awareness.  Patient to discharge at [facility] Assist level.  Patient's care partner is independent to provide the necessary physical assistance at discharge.    Reasons goals not met: Unable to achieve goal of min assist with lower body dressing skills due right UE hemiplegia with hypertonicity  Recommendation:  Patient will benefit from ongoing skilled OT services in home health setting to continue to advance functional skills in the area of BADL.  Equipment: No equipment provided  Reasons for discharge: discharge from hospital  Patient/family agrees with progress made and goals achieved: Yes  OT Discharge Precautions/Restrictions  Precautions Precautions: Fall Required Braces or Orthoses: Other Brace/Splint Other Brace/Splint: R AFO. Wear for 1 consecutive hour; remove after each therapy session to minimize risk of skin breakdown. Restrictions Weight Bearing Restrictions: No  Pain Pain Assessment Pain Assessment: No/denies pain  ADL ADL ADL Comments: see FIM  Vision/Perception  Vision- History Baseline Vision/History: No visual deficits Patient Visual Report: Blurring of vision Vision- Assessment Vision Assessment?: Vision impaired- to be further tested in functional context Eye Alignment: Impaired (comment) Ocular Range of Motion: Impaired-to be further tested in functional context;Restricted on the right;Restricted on the left;Restricted looking up;Restricted looking down   Cognition Overall Cognitive Status: Impaired/Different from baseline Arousal/Alertness: Awake/alert Orientation Level: Oriented X4 Attention: Alternating Sustained Attention: Appears intact Alternating  Attention: Appears intact Alternating Attention Impairment: Functional complex Memory: Impaired Memory Impairment: Decreased recall of new information;Decreased short term memory Decreased Short Term Memory: Verbal complex;Functional complex Awareness: Appears intact Problem Solving: Impaired Problem Solving Impairment: Verbal complex;Functional complex Executive Function: Self Monitoring;Self Correcting Self Monitoring: Impaired Self Monitoring Impairment: Verbal complex;Functional complex Self Correcting: Impaired Self Correcting Impairment: Verbal complex;Functional complex Safety/Judgment: Appears intact  Sensation Sensation Light Touch: Impaired Detail Light Touch Impaired Details: Impaired RUE Stereognosis: Impaired Detail Stereognosis Impaired Details: Impaired RUE Proprioception: Impaired Detail Proprioception Impaired Details: Impaired RUE Additional Comments: RUE diminished sensation (distally) Coordination Gross Motor Movements are Fluid and Coordinated: No Fine Motor Movements are Fluid and Coordinated: No Coordination and Movement Description: RUE weakness with rigid movements  Motor  Motor Motor: Ataxia;Motor apraxia;Hemiplegia Motor - Skilled Clinical Observations: Hemiplegia RUE > RLE, truncal ataxia noted in standing  Motor - Discharge Observations: Hemiplegia (RUE>RLE); truncal ataxia improved but still present during dynamic standing/gait. During gait (without AFO), pt demonstrates excessive R forefoot supination likely secondary to compensation for lack of R ankle dorsiflexion using great toe extension.  Mobility  Bed Mobility Bed Mobility: Supine to Sit;Sit to Supine;Sitting - Scoot to Edge of Bed Rolling Right: 6: Modified independent (Device/Increase time) Supine to Sit: 4: Min guard;HOB flat Sitting - Scoot to Edge of Bed: 6: Modified independent (Device/Increase time) Sit to Supine: 6: Modified independent (Device/Increase time);HOB  flat Transfers Transfers: Sit to Stand;Stand to Sit Sit to Stand: 4: Min guard;4: Min assist Stand to Sit: 4: Min assist   Trunk/Postural Assessment  Cervical Assessment Cervical Assessment: Exceptions to [facility] Thoracic Assessment Thoracic Assessment: Exceptions to [facility] Lumbar Assessment Lumbar Assessment: Within Functional Limits Postural Control Postural Control: Deficits on evaluation Trunk Control: During dynamic standing, pt demonstrates excessive lateral trunk displacement bilaterally, difficulty grading weight shifting. Righting Reactions: R ankle strategy ineffective with AP/PA balance perturbation. No stepping strategy noted.  Balance Balance Balance Assessed: Yes Static Sitting Balance Static Sitting - Balance Support: No upper extremity supported;Feet supported Static Sitting - Level of Assistance: 6: Modified independent (Device/Increase time) Dynamic Sitting Balance Dynamic Sitting - Balance Support: Feet supported;No upper extremity supported Dynamic Sitting - Level of Assistance: 6: Modified independent (Device/Increase time) Dynamic Sitting - Balance Activities: Lateral lean/weight shifting;Forward lean/weight shifting;Reaching across midline;Reaching for objects Static Standing Balance Static Standing - Balance Support: No upper extremity supported Static Standing - Level of Assistance: 5: Stand by assistance Dynamic Standing Balance Dynamic Standing - Balance Support: No upper extremity supported;During functional activity Dynamic Standing - Level of Assistance: 4: Min assist Dynamic Standing - Balance Activities: Lateral lean/weight shifting  Extremity/Trunk Assessment RUE Assessment RUE Assessment: Exceptions to [facility] RUE AROM (degrees) RUE Overall AROM Comments: Has full ROM at shulder and elbow however inconsistent d/t apraxia. Grossly 3+/5 strength shoulder flexion, elbow flexion/ext LUE Assessment LUE Assessment: Within Functional Limits  See FIM for  current functional status  Ski Polich 01/17/2014, 8:26 AM

## 2014-01-20 ENCOUNTER — Ambulatory Visit (INDEPENDENT_AMBULATORY_CARE_PROVIDER_SITE_OTHER): Payer: Medicare Other | Admitting: *Deleted

## 2014-01-20 DIAGNOSIS — I6350 Cerebral infarction due to unspecified occlusion or stenosis of unspecified cerebral artery: Secondary | ICD-10-CM

## 2014-01-20 DIAGNOSIS — I639 Cerebral infarction, unspecified: Secondary | ICD-10-CM

## 2014-01-20 LAB — MDC_IDC_ENUM_SESS_TYPE_REMOTE
MDC IDC SESS DTM: 20150714151351
MDC IDC SET ZONE DETECTION INTERVAL: 2000 ms
MDC IDC SET ZONE DETECTION INTERVAL: 320 ms
Zone Setting Detection Interval: 3000 ms

## 2014-01-24 NOTE — Progress Notes (Signed)
Loop recorder 

## 2014-01-28 DIAGNOSIS — G9389 Other specified disorders of brain: Secondary | ICD-10-CM

## 2014-01-28 DIAGNOSIS — I69959 Hemiplegia and hemiparesis following unspecified cerebrovascular disease affecting unspecified side: Secondary | ICD-10-CM | POA: Diagnosis not present

## 2014-01-28 DIAGNOSIS — G40909 Epilepsy, unspecified, not intractable, without status epilepticus: Secondary | ICD-10-CM

## 2014-01-28 DIAGNOSIS — I69920 Aphasia following unspecified cerebrovascular disease: Secondary | ICD-10-CM | POA: Diagnosis not present

## 2014-01-28 DIAGNOSIS — I428 Other cardiomyopathies: Secondary | ICD-10-CM

## 2014-01-28 DIAGNOSIS — E119 Type 2 diabetes mellitus without complications: Secondary | ICD-10-CM | POA: Diagnosis not present

## 2014-01-28 DIAGNOSIS — I10 Essential (primary) hypertension: Secondary | ICD-10-CM | POA: Diagnosis not present

## 2014-01-31 ENCOUNTER — Encounter: Payer: Self-pay | Admitting: Internal Medicine

## 2014-02-15 ENCOUNTER — Inpatient Hospital Stay: Payer: Medicare Other | Admitting: Physical Medicine & Rehabilitation

## 2014-02-18 ENCOUNTER — Ambulatory Visit (INDEPENDENT_AMBULATORY_CARE_PROVIDER_SITE_OTHER): Payer: Medicare Other | Admitting: *Deleted

## 2014-02-18 DIAGNOSIS — I6350 Cerebral infarction due to unspecified occlusion or stenosis of unspecified cerebral artery: Secondary | ICD-10-CM

## 2014-02-18 DIAGNOSIS — I639 Cerebral infarction, unspecified: Secondary | ICD-10-CM

## 2014-02-25 NOTE — Progress Notes (Signed)
Loop recorder 

## 2014-03-17 ENCOUNTER — Encounter: Payer: Self-pay | Admitting: Internal Medicine

## 2014-03-22 LAB — MDC_IDC_ENUM_SESS_TYPE_REMOTE

## 2014-03-30 ENCOUNTER — Telehealth: Payer: Self-pay | Admitting: Cardiology

## 2014-03-30 ENCOUNTER — Encounter: Payer: Self-pay | Admitting: Cardiology

## 2014-03-30 NOTE — Telephone Encounter (Signed)
Attempted to call pt and request that he send manual transmission for loop recorder from home monitor. Number has been disconnected. I will mail letter.

## 2014-03-31 ENCOUNTER — Encounter: Payer: Self-pay | Admitting: Internal Medicine

## 2014-05-10 ENCOUNTER — Encounter: Payer: Self-pay | Admitting: *Deleted

## 2014-05-27 ENCOUNTER — Encounter: Payer: Self-pay | Admitting: Cardiology

## 2014-05-27 ENCOUNTER — Telehealth: Payer: Self-pay | Admitting: Cardiology

## 2014-05-27 NOTE — Telephone Encounter (Signed)
LMOVM informing pt to send manual transmission from home monitor b/c home monitor has become disconnected. This generally happens when theres been a power outage, monitor has been unplugged, or pt has been away from home montior for a few days / weeks at a time. Letter mailed as well.   

## 2014-05-27 NOTE — Telephone Encounter (Signed)
-----   Message from Marily Lente, RN sent at 05/22/2014  6:39 PM EST ----- If you haven't heard from him in the next couple of weeks, he will need certified letter

## 2014-06-14 ENCOUNTER — Telehealth: Payer: Self-pay | Admitting: Cardiology

## 2014-06-14 ENCOUNTER — Encounter: Payer: Self-pay | Admitting: Cardiology

## 2014-06-14 NOTE — Telephone Encounter (Signed)
LMOVM requesting that pt send manual transmission b/c home monitor has been disconnected.   

## 2014-06-16 ENCOUNTER — Encounter (HOSPITAL_COMMUNITY): Payer: Self-pay | Admitting: Internal Medicine

## 2014-06-17 ENCOUNTER — Encounter: Payer: Self-pay | Admitting: Cardiology

## 2014-07-07 ENCOUNTER — Emergency Department: Payer: Self-pay | Admitting: Emergency Medicine

## 2014-07-07 LAB — COMPREHENSIVE METABOLIC PANEL
ALT: 15 U/L
Albumin: 3.8 g/dL (ref 3.4–5.0)
Alkaline Phosphatase: 77 U/L
Anion Gap: 6 — ABNORMAL LOW (ref 7–16)
BUN: 11 mg/dL (ref 7–18)
Bilirubin,Total: 0.3 mg/dL (ref 0.2–1.0)
CO2: 28 mmol/L (ref 21–32)
CREATININE: 1.19 mg/dL (ref 0.60–1.30)
Calcium, Total: 8.3 mg/dL — ABNORMAL LOW (ref 8.5–10.1)
Chloride: 102 mmol/L (ref 98–107)
EGFR (African American): >60
EGFR (Non-African Amer.): >60
GLUCOSE: 181 mg/dL — AB (ref 65–99)
Osmolality: 276 (ref 275–301)
Potassium: 3.7 mmol/L (ref 3.5–5.1)
SGOT(AST): 10 U/L — ABNORMAL LOW (ref 15–37)
SODIUM: 136 mmol/L (ref 136–145)
TOTAL PROTEIN: 7.3 g/dL (ref 6.4–8.2)

## 2014-07-07 LAB — CBC WITH DIFFERENTIAL/PLATELET
Basophil #: 0.1 10*3/uL (ref 0.0–0.1)
Basophil %: 1.3 %
EOS ABS: 0.3 10*3/uL (ref 0.0–0.7)
Eosinophil %: 6.2 %
HCT: 44.5 % (ref 40.0–52.0)
HGB: 14.3 g/dL (ref 13.0–18.0)
Lymphocyte #: 1.7 10*3/uL (ref 1.0–3.6)
Lymphocyte %: 36.8 %
MCH: 28.9 pg (ref 26.0–34.0)
MCHC: 32.2 g/dL (ref 32.0–36.0)
MCV: 90 fL (ref 80–100)
Monocyte #: 0.4 x10 3/mm (ref 0.2–1.0)
Monocyte %: 9.1 %
Neutrophil #: 2.2 10*3/uL (ref 1.4–6.5)
Neutrophil %: 46.6 %
Platelet: 192 10*3/uL (ref 150–440)
RBC: 4.97 10*6/uL (ref 4.40–5.90)
RDW: 13.5 % (ref 11.5–14.5)
WBC: 4.7 10*3/uL (ref 3.8–10.6)

## 2014-07-07 LAB — DRUG SCREEN, URINE

## 2014-07-07 LAB — PROTIME-INR
INR: 1.1
Prothrombin Time: 13.7 secs (ref 11.5–14.7)

## 2014-07-07 LAB — CK TOTAL AND CKMB (NOT AT ARMC): CK, TOTAL: 142 U/L (ref 39–308)

## 2014-07-07 LAB — TROPONIN I: Troponin-I: <0.02 ng/mL

## 2014-07-07 LAB — ETHANOL: Ethanol: 198 mg/dL

## 2014-10-29 NOTE — H&P (Signed)
PATIENT NAME:  JOCELYN, PIPITONE MR#:  [id number] DATE OF BIRTH:  1971/01/07  DATE OF ADMISSION:  12/14/2013  PRIMARY CARE PHYSICIAN: Dr. Hessie Diener   CHIEF COMPLAINT: Feeling weak.   HISTORY OF PRESENT ILLNESS: This is a 44 year old man with history of hypertension, diabetes, hyperlipidemia, CVA in the past and seizure disorder. He presents to the ER with feeling weak for two days. He has had trouble getting his words out. He understands what you are trying to say, but has difficulty speaking those words. He has been out of his lisinopril, hydrochlorothiazide, Lipitor and  Norvasc for the past 2-1/2 months. In the ER, he was evaluated and thought to have a stroke. CT scan of the head showed stable hydrocephalus. Hospitalist services were contacted for further evaluation.   PAST MEDICAL HISTORY: Hypertension, diabetes, hyperlipidemia, seizure disorder, CVA. Hydrocephalus seen on CT scan, which was remarked as stable.   PAST SURGICAL HISTORY: None.   ALLERGIES: No known drug allergies.   MEDICATIONS: Include: Glipizide XL 10 mg b.i.d. gabapentin 300 mg t.i.d. metformin 1000 mg b.i.d. He states he takes aspirin 81 mg daily. Other medications that he ran out include lisinopril, hydrochlorothiazide 20/25 1 tablet daily, Lipitor 80 mg at bedtime, Norvasc 10 mg daily.   SOCIAL HISTORY: No smoking. Does drink three vodkas once a week. No drug use. His brother lives with him.   FAMILY HISTORY: Unable to tell me anything about his parents health, probably secondary to difficulty getting his words out.   REVIEW OF SYSTEMS:  CONSTITUTIONAL: No fever. Positive for chills. Positive for sweats. Positive for weakness.   EYES: No blurry vision.  EARS, NOSE, MOUTH AND THROAT: No hearing loss. No sore throat. No difficulty swallowing.  CARDIOVASCULAR: No chest pain. No palpitations.  RESPIRATORY: No shortness of breath. No coughing. No sputum. No hemoptysis.  GASTROINTESTINAL: No nausea. No vomiting. No diarrhea.  No constipation. No bright red blood per rectum. No melena. No abdominal pain.  GENITOURINARY: No burning on urination. No hematuria.  MUSCULOSKELETAL: Positive for joint pains in the arms and legs.  INTEGUMENTARY: No rashes or eruptions.  NEUROLOGIC: No fainting or blackouts.  PSYCHIATRIC: No anxiety or depression.   PHYSICAL EXAMINATION: VITAL SIGNS: Temperature 98.5, pulse 75, respirations 17, blood pressure 192/108, pulse oximetry 99% on room air.  GENERAL: No respiratory distress.  HEENT: Eyes: Depending on which eye he is looking at you, the other eye is deviated out. Extraocular muscles intact, but is limited secondary to one eye is deviated out.  Ears, nose, mouth and throat: Tympanic membranes: No erythema. Nasal mucosa: No erythema.  Throat: No erythema, no exudate seen. lips and gums: No lesions.  NECK: No JVD. No bruits. No lymphadenopathy. No thyromegaly. No thyroid nodules palpated.  LUNGS: Clear to auscultation. No use of accessory muscles to breathe. No rhonchi, rales, or wheeze heard.  CARDIOVASCULAR: S1, S2 normal. No gallops, rubs, or murmurs heard. Carotid upstroke 2+ bilaterally. No bruits, dorsalis pedis pulses 2+ bilaterally. No edema of the lower extremity.  ABDOMEN: Soft, nontender. No organomegaly/splenomegaly. Normoactive bowel sounds. No masses felt.  LYMPHATIC: No lymph nodes in the neck.  MUSCULOSKELETAL: No clubbing, trace edema. No cyanosis.  SKIN: No ulcers or lesions.  NEUROLOGIC: Cranial nerves. The patient has a right facial droop. Extraocular muscle testing is limited secondary to one eye deviated out. It is limited on how far he can move cross midline with the deviated eye. Other cranial nerves: Tongue deviation to the right. Power 3/5 right arm at  the shoulder in coordination with the right hand. Grip strength 4/5. Power at the elbow 4/5. Right leg strength 4+/5. Left-sided strength 5/5. Upper and lower extremities reflexes are 1+ bilaterally, Babinski  equivocal bilaterally.   PSYCHIATRIC: The patient is alert, oriented to person and place.   LABORATORY AND RADIOLOGICAL DATA: White blood cell count 4.2, H and H 14.5 and 45.2, platelet count 199, PT, INR and PTT normal range. Glucose 140, BUN 18, creatinine 1.42, sodium 140, potassium 3.8, chloride 108, CO2 29, calcium 9.0. Liver function tests normal range. Troponin negative. Urinalysis 1+ blood. CT scan of the head showed stable hydrocephalus in comparison with 04/05/2007, dilation of the lateral ventricles and third ventricle. Fourth ventricle was not dilated   ASSESSMENT AND PLAN: 1. Acute to subacute cerebrovascular accident with right-sided weakness and a right facial droop, dysarthria, presumed stroke. Since the patient does take aspirin at home I will take a step up with Plavix, obtain an MRI of the brain with and without contrast. With his seizure history also want to rule out brain tumor. We will get a carotid ultrasound and echocardiogram and monitor on telemetry. We will get physical therapy, speech therapy and occupational therapy consultations.  2. Malignant hypertension: The patient has been without his  lisinopril/HCT and Norvasc for the past 2-1/2 months. I think his blood pressure has been high since then. I will restart a stat dose of the Norvasc and if still high we will restart the lisinopril/HCT. I do not want to lower blood pressure too much. Goal systolic blood pressure greater than 160 at this point. We will hold off on IV medications at this point.  3. Diabetes. We will hold metformin. Continue glipizide and will put on sliding scale.  4. Hyperlipidemia. Continue high-dose Lipitor, which is actually a restart because he has been out of this medication for 2-1/2 months. We will check a lipid profile in the a.m.  5. Seizure disorder, on gabapentin.   TIME SPENT ON ADMISSION: 55 minutes.   The patient is a full code.    ____________________________ Herschell Dimes. Renae Gloss,  MD rjw:sg D: 12/14/2013 11:15:00 ET T: 12/14/2013 11:36:50 ET JOB#: [id number]  cc: Herschell Dimes. Renae Gloss, MD, <Dictator> Abby D. Hessie Diener MD  Salley Scarlet MD ELECTRONICALLY SIGNED 12/14/2013 18:37

## 2014-10-29 NOTE — Consult Note (Signed)
PATIENT NAME:  [name], [name] MR#:  [id number] DATE OF BIRTH:  October 21, 1970  DATE OF CONSULTATION:  12/17/2013  REFERRING PHYSICIAN:   CONSULTING PHYSICIAN:  Laurier Nancy, MD  INDICATION FOR CONSULTATION: CVA and evaluate for transesophageal echocardiogram.  HISTORY OF PRESENT ILLNESS: This is a 43 year old African American male with a past medical history of hypertension, diabetes, hyperlipidemia, CVA in the past and seizure disorder, who presented to the Emergency Room with weakness on the right side. CT of the head showed hydrocephalus. Neurology felt that he has a right basal ganglia infarct. He had right-sided weakness.   PAST MEDICAL HISTORY: History of hypertension, diabetes, hyperlipidemia, seizure disorder and hydrocephalus seen on a scan in the past and has been stable.   ALLERGIES: None.   HOME MEDICATIONS: Glipizide; gabapentin; aspirin 81; lisinopril; hydrochlorothiazide; Lipitor 80 and Norvasc 10.   SOCIAL HISTORY: He drinks 3e vodkas a week. No history of tobacco use.   FAMILY HISTORY: Positive for coronary artery disease.   PHYSICAL EXAMINATION:  GENERAL: He is alert, but not really comprehends everything. His brother had to give consent.  NECK: No JVD.  LUNGS: Clear.  HEART: Regular rate and rhythm. Normal S1, S2. No audible murmur.  ABDOMEN: Soft, nontender, positive bowel sounds.  EXTREMITIES: No pedal edema. He has right-sided weakness. He is kind of aphasic.   ASSESSMENT AND PLAN: The patient had right basal ganglia acute infarct with hydrocephalus. I was asked to evaluate to do transesophageal echocardiogram. Transesophageal echocardiogram was done without complications. The patient showed normal left ventricular systolic function. There was no evidence of thrombi in the left atrium, left ventricle and the apex. There was smoke, which suggests that there is low flow state. Bubble study was done twice, which was negative. Thus, there is no evidence of atrial septal  defect. The patient has been started on Lipitor and aspirin. Neurology has seen the patient and added Heparin and Lipitor 80 and they were considering Plavix, but I recommend that the patient should be anticoagulated.  Thank you very much for referral.  ____________________________ Laurier Nancy, MD sak:aw D: 12/17/2013 08:08:07 ET T: 12/17/2013 08:21:54 ET JOB#: [id number]  cc: Laurier Nancy, MD, <Dictator> Laurier Nancy MD ELECTRONICALLY SIGNED 01/19/2014 9:20

## 2014-10-29 NOTE — Discharge Summary (Signed)
PATIENT NAME:  [name], [name] MR#:  [id number] DATE OF BIRTH:  May 10, 1971  DATE OF ADMISSION:  12/14/2013 DATE OF DISCHARGE:  12/17/2013  Addendum  Please refer to the discharge summary dictated yesterday by me. The patient has no complaints. The patient is status post TEE.  TEE did not show any abnormality. The patient's vital signs are stable. He is clinically stable and will be discharged to acute inpatient rehab in [place] today. Discussed the patient's discharge plan with the patient, the patient's brother, nurse, social worker and case Production designer, theatre/television/film.   TIME SPENT: About 40 minutes.   ____________________________ Shaune Pollack, MD qc:dmm D: 12/17/2013 11:51:00 ET T: 12/17/2013 12:03:50 ET JOB#: [id number]  cc: Shaune Pollack, MD, <Dictator> Shaune Pollack MD ELECTRONICALLY SIGNED 12/17/2013 15:34

## 2014-10-29 NOTE — Consult Note (Signed)
Referring Physician:  Loletha Grayer :   Primary Care Physician:  Loletha Grayer : [facility], [facility], [address], [place], [place] [address], [phone]  Reason for Consult: Admit Date: 14-Dec-2013  Chief Complaint: R sided weakness  Reason for Consult: CVA   History of Present Illness: History of Present Illness:   43 yo RHD M presents to [facility] secondary to acute R sided weakness.  Pt denies any numbness, tingling, vision changes or speech problems.  Pt states that he has problems with his leg more than his arm.  He has never had anything like this before.  He states that he has always had abnormal eye movements but no other problems.  When questioned about his history of hydrocephalus, pt states it has occured after head trauma only one week ago but I am not sure that pt understood question even when presented in a different format.  ROS:  General denies complaints   HEENT no complaints   Lungs no complaints   Cardiac no complaints   GI no complaints   GU no complaints   Musculoskeletal no complaints   Extremities no complaints   Skin no complaints   Neuro R sided weakness   Endocrine no complaints   Psych no complaints   Past Medical/Surgical Hx:  seizure dis.: per chart  CVA/Stroke:   Hyperlipidemia:   HTN:   Diabetes:   Denies:   Past Medical/ Surgical Hx:  Past Medical History reviewed by me as above   Past Surgical History reviewed by me as above   Home Medications: Medication Instructions Last Modified Date/Time  GlipiZIDE XL 10 mg oral tablet, extended release 1 tab(s) orally every 12 hours 09-Jun-15 09:38  atorvastatin 80 mg oral tablet 1 tab(s) orally once a day (at bedtime) 09-Jun-15 09:38  amLODIPine 10 mg oral tablet 1 tab(s) orally once a day 09-Jun-15 09:38  hydrochlorothiazide-lisinopril 25 mg-20 mg oral tablet 1 tab(s) orally once a day 09-Jun-15 09:38  metFORMIN 500 mg oral tablet 2  tab(s) orally 2 times a day 09-Jun-15 09:38  gabapentin 300 mg oral capsule 1 cap(s) orally 3 times a day 09-Jun-15 09:38   Allergies:  No Known Allergies:   Allergies:  Allergies NKDA   Social/Family History: Employment Status: disabled  Lives With: parents  Living Arrangements: apartment  Social History: no tob, no EtOH, no illicits  Family History: + stroke, no seizures   Vital Signs: **Vital Signs.:   10-Jun-15 16:02  Vital Signs Type Q 4hr  Temperature Temperature (F) 98.3  Celsius 36.8  Temperature Source oral  Pulse Pulse 66  Respirations Respirations 18  Systolic BP Systolic BP 295  Diastolic BP (mmHg) Diastolic BP (mmHg) 89  Mean BP 110  Pulse Ox % Pulse Ox % 99  Pulse Ox Activity Level  At rest  Oxygen Delivery Room Air/ 21 %   Physical Exam: General: overweight, NAD  HEENT: normocephalic, sclera nonicteric, oropharynx clear  Neck: supple, no JVD, no bruits  Chest: CTA B, no wheezing, good movement  Cardiac: RRR, no murmurs, no edema, 2+ pulses  Extremities: no C/C/E, FROM   Neurologic Exam: Mental Status: alert but only time and place oriented, nl speech and language, bradyphrenia  Cranial Nerves: PERRLA, EOMI with walled eye deformity, nl VF, R droop, tongue midline, shoulder shrug equal  Motor Exam: 4/5 R, 5/5 L, nl tone  Deep Tendon Reflexes: 2+/4 B, plantars downgoing B, no Hoffman  Sensory Exam: pinprick, temperature, and vibration  intact B  Coordination: FTN and HTS WNL   Lab Results: LabObservation:  09-Jun-15 12:24   OBSERVATION Reason for Test  Hepatic:  09-Jun-15 09:59   Bilirubin, Total 0.7  Alkaline Phosphatase 73 (45-117 NOTE: New Reference Range 05/28/13)  SGPT (ALT) 18  SGOT (AST) 22  Total Protein, Serum 7.7  Albumin, Serum 3.9  Routine Chem:  10-Jun-15 04:08   Cholesterol, Serum  205  Triglycerides, Serum 154  HDL (INHOUSE) 49  VLDL Cholesterol Calculated 31  LDL Cholesterol Calculated  125 (Result(s) reported on 15 Dec 2013 at 05:19AM.)  Glucose, Serum 99  BUN 14  Creatinine (comp) 1.07  Sodium, Serum 139  Potassium, Serum  3.4  Chloride, Serum 104  CO2, Serum 28  Calcium (Total), Serum 9.1  Anion Gap 7  Osmolality (calc) 278  eGFR (African American) >60  eGFR (Non-African American) >60 (eGFR values <50m/min/1.73 m2 may be an indication of chronic kidney disease (CKD). Calculated eGFR is useful in patients with stable renal function. The eGFR calculation will not be reliable in acutely ill patients when serum creatinine is changing rapidly. It is not useful in  patients on dialysis. The eGFR calculation may not be applicable to patients at the low and high extremes of body sizes, pregnant women, and vegetarians.)  Cardiac:  09-Jun-15 13:26   Troponin I < 0.02 (0.00-0.05 0.05 ng/mL or less: NEGATIVE  Repeat testing in 3-6 hrs  if clinically indicated. >0.05 ng/mL: POTENTIAL  MYOCARDIAL INJURY. Repeat  testing in 3-6 hrs if  clinically indicated. NOTE: An increase or decrease  of 30% or more on serial  testing suggests a  clinically important change)  Routine UA:  09-Jun-15 09:59   Color (UA) Straw  Clarity (UA) Clear  Glucose (UA) Negative  Bilirubin (UA) Negative  Ketones (UA) Negative  Specific Gravity (UA) 1.010  Blood (UA) 1+  pH (UA) 7.0  Protein (UA) Negative  Nitrite (UA) Negative  Leukocyte Esterase (UA) Negative (Result(s) reported on 14 Dec 2013 at 10:39AM.)  RBC (UA) 3 /HPF  WBC (UA) <1 /HPF  Bacteria (UA) NONE SEEN  Epithelial Cells (UA) NONE SEEN  Result(s) reported on 14 Dec 2013 at 10:39AM.  Routine Coag:  09-Jun-15 09:59   Activated PTT (APTT) 26.6 (A HCT value >55% may artifactually increase the APTT. In one study, the increase was an average of 19%. Reference: "Effect on Routine and Special Coagulation Testing Values of Citrate Anticoagulant Adjustment in Patients with High HCT Values." American Journal of Clinical Pathology 2006;126:400-405.)   Prothrombin 13.4  INR 1.0 (INR reference interval applies to patients on anticoagulant therapy. A single INR therapeutic range for coumarins is not optimal for all indications; however, the suggested range for most indications is 2.0 - 3.0. Exceptions to the INR Reference Range may include: Prosthetic heart valves, acute myocardial infarction, prevention of myocardial infarction, and combinations of aspirin and anticoagulant. The need for a higher or lower target INR must be assessed individually. Reference: The Pharmacology and Management of the Vitamin K  antagonists: the seventh ACCP Conference on Antithrombotic and Thrombolytic Therapy. CYYTKP.5465Sept:126 (3suppl): [id number] A HCT value >55% may artifactually increase the PT.  In one study,  the increase was an average of 25%. Reference:  "Effect on Routine and Special Coagulation Testing Values of Citrate Anticoagulant Adjustment in Patients with High HCT Values." American Journal of Clinical Pathology 2006;126:400-405.)  Routine Hem:  10-Jun-15 04:08   WBC (CBC) 5.1  RBC (CBC) 4.62  Hemoglobin (CBC) 13.4  Hematocrit (  CBC) 42.1  Platelet Count (CBC) 188  MCV 91  MCH 28.9  MCHC  31.7  RDW 14.4  Neutrophil % 44.1  Lymphocyte % 39.4  Monocyte % 10.2  Eosinophil % 5.7  Basophil % 0.6  Neutrophil # 2.3  Lymphocyte # 2.0  Monocyte # 0.5  Eosinophil # 0.3  Basophil # 0.0 (Result(s) reported on 15 Dec 2013 at 05:24AM.)   Radiology Results: [place]:    09-Jun-15 12:28, US Carotid Doppler Bilateral  US Carotid Doppler Bilateral   REASON FOR EXAM:    cva  COMMENTS:       PROCEDURE: [place]  - US CAROTID DOPPLER BILATERAL  - Dec 14 2013 12:28PM     CLINICAL DATA:  CVA.    EXAM:  BILATERAL CAROTID DUPLEX ULTRASOUND    TECHNIQUE:  Pearline Cables scale imaging, color Doppler and duplex ultrasound were  performed of bilateral carotid and vertebral arteries in the neck.    COMPARISON:  None.  FINDINGS:  Criteria: Quantification of  carotid stenosis is based on velocity  parameters that correlate the residual internal carotid diameter  with NASCET-based stenosis levels, using the diameter of the distal  internal carotid lumen as the denominator for stenosis measurement.    The following velocity measurements were obtained:    RIGHT    ICA:  82 cm/sec    CCA:  83 cm/sec    SYSTOLIC ICA/CCA RATIO:  1.0  DIASTOLIC ICA/CCA RATIO:  2.8    ECA:  94 cm/sec    LEFT    ICA:  62 cm/sec    CCA:  85 cm/sec    SYSTOLIC ICA/CCA RATIO:  0.7    DIASTOLIC ICA/CCA RATIO:  1.7    ECA:  75 cm/sec  RIGHT CAROTID ARTERY: Right carotid arteries are patent without  significant stenosis. Intimal thickening or minimal plaque near the  right carotid bulb. No significant stenosis in the right internal  carotid artery.    RIGHT VERTEBRAL ARTERY: Antegrade flow and normal waveform in the  right vertebral artery.    LEFT CAROTID ARTERY: Left carotid arteries are patent without  significant stenosis. Mild plaque or intimal thickening at the  proximal internal carotid artery.    LEFT VERTEBRAL ARTERY: Antegrade flow and normal waveform in the  left vertebral artery.   IMPRESSION:  Minimal plaque in the carotid arteries. No significant carotid  artery stenosis.    Patent vertebral arteries.      Electronically Signed    By: Markus Daft M.D.    On: 12/14/2013 12:40         Verified By: Burman Riis, M.D.,  CT:    09-Jun-15 08:10, CT Head Without Contrast  CT Head Without Contrast   REASON FOR EXAM:    CVA  COMMENTS:       PROCEDURE: CT  - CT HEAD WITHOUT CONTRAST  - Dec 14 2013  8:10AM     CLINICAL DATA:  CVA.    EXAM:  CT HEAD WITHOUT CONTRAST    TECHNIQUE:  Contiguous axial images were obtained from the base of the skull  through the vertex without intravenous contrast.    COMPARISON:  04/05/2007  FINDINGS:  Stable hydrocephalus with dilatation of the lateral ventricles and  third ventricle. The fourth  ventricle is not dilated. The brain  demonstrates no evidence of hemorrhage, infarction, edema, mass  effect, extra-axial fluid collection or mass lesion. The skull is  unremarkable.     IMPRESSION:  Stable hydrocephalus.  Electronically Signed    By: Aletta Edouard M.D.    On: 12/14/2013 08:11     Verified By: Azzie Roup, M.D.,   Radiology Impression: Radiology Impression: MRI of brain personally reviewed by me and shows an acute large L BG infarct, severe hydrocephalus   Impression/Recommendations: Recommendations:   prior notes reviewed by me reviewed by me   Acute L BG infarct-  this is quite large to be just a lacunar infarct and there is concern for a large vessel occlusion even though exam just supports a lacunar infarct from HTN and DM;  this is somewhat symptomatic with R sided weakness Hydrocephalus-  stable per radiology but unknown cause CTA of head and neck check hypercoaguable w/u and autoimmune may need TEE pending CTA results permissive HTN for now as pt may have large vessel occlusion continue plavix and statin will follow  Electronic Signatures: Jamison Neighbor (MD)  (Signed 10-Jun-15 16:43)  Authored: REFERRING PHYSICIAN, Primary Care Physician, Consult, History of Present Illness, Review of Systems, PAST MEDICAL/SURGICAL HISTORY, HOME MEDICATIONS, ALLERGIES, Social/Family History, NURSING VITAL SIGNS, Physical Exam-, LAB RESULTS, RADIOLOGY RESULTS, Recommendations   Last Updated: 10-Jun-15 16:43 by Jamison Neighbor (MD)

## 2014-10-29 NOTE — Discharge Summary (Signed)
PATIENT NAME:  [name], GARGAN MR#:  [id number] DATE OF BIRTH:  1970/12/04  DATE OF ADMISSION:  12/14/2013 DATE OF DISCHARGE:  12/16/2013  PRIMARY CARE PHYSICIAN: Nonlocal.  CONSULTING PHYSICIAN: Neurology, Dr. Katrinka Blazing  CARDIOLOGIST: Dr. Welton Flakes   DISCHARGE DIAGNOSES: 1.  Acute cerebrovascular accident.  2.  Malignant hypertension.  3.  Diabetes.  4.  Seizure disorder.  5.  Hypokalemia   CONDITION: Stable.   CODE STATUS: FULL.  HOME MEDICATIONS: Please refer to the medication reconciliation list.   DIET: Low-sodium, low-cholesterol, ADA diet.   ACTIVITY: As tolerated.   FOLLOW-UP CARE: Follow up with PCP within 1 to 2 weeks.   REASON FOR ADMISSION: Feeling weak.   HOSPITAL COURSE: The patient is a 44 year old African American male with a history of hypertension, diabetes, hyperlipidemia, CVA in the past and seizure disorder who was sent to the ED due to weakness for 2 days. The patient had difficulty speaking and right side weakness. CAT scan of head showed stable hydrocephalus. For detailed history and physical examination, please refer to the admission note dictated by Dr. Renae Gloss. On admission date, the patient's laboratory data showed WBC 4.2, which is normal. Troponin was negative.  1.  Acute CVA. Since the patient had facial droop and dysarthria, the patient was presumed to have an acute CVA. In addition to aspirin, the patient got Plavix and statin. MRI of brain showed acute CVA. According to Dr. Michaelle Copas recommendations, the patient got CTA which showed left MCA M1 segment occlusion. Dr. Katrinka Blazing suggested to continue aspirin, Plavix and statin and get a TEE. The patient will get a TEE tomorrow.  2.  Malignant hypertension. Control improved after treatment with lisinopril and HCTZ and Norvasc.  3.  Diabetes. Controlled with sliding scale.  4.  Seizure disorder. Stable. No seizure activity. 5.  The patient underwent physical therapy evaluation. The patient needs acute inpatient rehab.  The patient is accepted by rehab from [facility] and will be discharged to acute rehab tomorrow after TEE. Discharged the patient's discharge plan with the patient, the patient's brother, nurse, case Production designer, theatre/television/film, and Child psychotherapist.   TIME SPENT: About 39 minutes.   ____________________________ Shaune Pollack, MD qc:sb D: 12/16/2013 16:11:40 ET T: 12/16/2013 16:35:42 ET JOB#: [id number]  cc: Shaune Pollack, MD, <Dictator> Shaune Pollack MD ELECTRONICALLY SIGNED 12/16/2013 18:02

## 2014-11-27 ENCOUNTER — Emergency Department: Payer: Medicare Other

## 2014-11-27 ENCOUNTER — Emergency Department
Admission: EM | Admit: 2014-11-27 | Discharge: 2014-11-28 | Disposition: A | Discharge status: Home / Self Care | Payer: Medicare Other | Attending: Emergency Medicine | Admitting: Emergency Medicine

## 2014-11-27 ENCOUNTER — Encounter: Payer: Self-pay | Admitting: Emergency Medicine

## 2014-11-27 DIAGNOSIS — Z7982 Long term (current) use of aspirin: Secondary | ICD-10-CM | POA: Diagnosis not present

## 2014-11-27 DIAGNOSIS — Z87891 Personal history of nicotine dependence: Secondary | ICD-10-CM | POA: Diagnosis not present

## 2014-11-27 DIAGNOSIS — E119 Type 2 diabetes mellitus without complications: Secondary | ICD-10-CM | POA: Diagnosis not present

## 2014-11-27 DIAGNOSIS — Y998 Other external cause status: Secondary | ICD-10-CM | POA: Insufficient documentation

## 2014-11-27 DIAGNOSIS — Z79899 Other long term (current) drug therapy: Secondary | ICD-10-CM | POA: Insufficient documentation

## 2014-11-27 DIAGNOSIS — W1789XA Other fall from one level to another, initial encounter: Secondary | ICD-10-CM | POA: Diagnosis not present

## 2014-11-27 DIAGNOSIS — S299XXA Unspecified injury of thorax, initial encounter: Secondary | ICD-10-CM | POA: Diagnosis present

## 2014-11-27 DIAGNOSIS — Y9389 Activity, other specified: Secondary | ICD-10-CM | POA: Insufficient documentation

## 2014-11-27 DIAGNOSIS — I10 Essential (primary) hypertension: Secondary | ICD-10-CM | POA: Insufficient documentation

## 2014-11-27 DIAGNOSIS — Y9289 Other specified places as the place of occurrence of the external cause: Secondary | ICD-10-CM | POA: Insufficient documentation

## 2014-11-27 DIAGNOSIS — W19XXXA Unspecified fall, initial encounter: Secondary | ICD-10-CM

## 2014-11-27 DIAGNOSIS — R0781 Pleurodynia: Secondary | ICD-10-CM

## 2014-11-27 LAB — CBC
HCT: 42.7 % (ref 40.0–52.0)
HEMOGLOBIN: 13.9 g/dL (ref 13.0–18.0)
MCH: 29.6 pg (ref 26.0–34.0)
MCHC: 32.6 g/dL (ref 32.0–36.0)
MCV: 90.9 fL (ref 80.0–100.0)
PLATELETS: 207 10*3/uL (ref 150–440)
RBC: 4.7 MIL/uL (ref 4.40–5.90)
RDW: 15 % — ABNORMAL HIGH (ref 11.5–14.5)
WBC: 5.2 10*3/uL (ref 3.8–10.6)

## 2014-11-27 LAB — COMPREHENSIVE METABOLIC PANEL
ALBUMIN: 4.8 g/dL (ref 3.5–5.0)
ALT: 16 U/L — AB (ref 17–63)
AST: 15 U/L (ref 15–41)
Alkaline Phosphatase: 71 U/L (ref 38–126)
Anion gap: 4 — ABNORMAL LOW (ref 5–15)
BUN: 12 mg/dL (ref 6–20)
CHLORIDE: 108 mmol/L (ref 101–111)
CO2: 29 mmol/L (ref 22–32)
CREATININE: 1.04 mg/dL (ref 0.61–1.24)
Calcium: 8.7 mg/dL — ABNORMAL LOW (ref 8.9–10.3)
GFR calc Af Amer: >60 mL/min (ref >60)
GFR calc non Af Amer: >60 mL/min (ref >60)
GLUCOSE: 88 mg/dL (ref 65–99)
Potassium: 3.6 mmol/L (ref 3.5–5.1)
Sodium: 141 mmol/L (ref 135–145)
Total Bilirubin: 0.5 mg/dL (ref 0.3–1.2)
Total Protein: 8 g/dL (ref 6.5–8.1)

## 2014-11-27 LAB — GLUCOSE, CAPILLARY: GLUCOSE-CAPILLARY: 59 mg/dL — AB (ref 65–99)

## 2014-11-27 NOTE — ED Notes (Signed)
Patient presents to Emergency Department via EMS with complaints of fall from standing onto wheelchair striking his right ribs, (c/o 10/10 pain there), pt has hx of CVA and DM cbgg 57

## 2014-11-27 NOTE — ED Notes (Signed)
Patient transported to X-ray 

## 2014-11-27 NOTE — ED Provider Notes (Signed)
[facility] Emergency Department Provider Note  ____________________________________________  Time seen: Approximately 11:14 PM  I have reviewed the triage vital signs and the nursing notes.   HISTORY  Chief Complaint Fall    HPI [name] is a 44 y.o. male who comes in tonight after a fall off of his front porch. The patient reports that he typically uses a walker when he walks but he was in a really tight space so he did not use his walker. The patient reports that he did not have his balance enough and lost his balance resulting in his fall off of the porch onto his wheelchair. The patient reports that he developed some 10 out of 10 sharp right sided chest pain. The patient reports that he is concerned he may have broken his ribs so he was told to come into the hospital for evaluation. The patient reports that this happened at approximately 2100. The patient reports that he has not eaten today because he does not have food in the boarding house in which he lives. Patient reports though that he was drinking today a 40 ounce of beer and a pint of liquor. The patient reports that he does not think he hit his head and he did not pass out.   Past Medical History  Diagnosis Date  . Hypertension   . Diabetes mellitus without complication   . Seizures   . Stroke     Patient Active Problem List   Diagnosis Date Noted  . CVA (cerebral infarction) 12/17/2013  . HTN (hypertension) 12/17/2013  . Diabetes 12/17/2013  . Cardiomyopathy 12/17/2013    Past Surgical History  Procedure Laterality Date  . Loop recorder implant  12/20/13    MDT LinQ implanted by Dr Ladona Ridgel for cryptogenic stroke  . Loop recorder implant N/A 12/20/2013    Procedure: LOOP RECORDER IMPLANT;  Surgeon: Marinus Maw, MD;  [facility]: [facility] C[facility] LAB;  Service: Cardiovascular;  Laterality: N/A;    Current Outpatient Rx  Name  Route  Sig  Dispense  Refill  . amLODipine (NORVASC) 10 MG tablet    Oral   Take 1 tablet (10 mg total) by mouth daily.   30 tablet   1   . aspirin 81 MG chewable tablet   Oral   Chew 1 tablet (81 mg total) by mouth daily.   30 tablet   1   . atorvastatin (LIPITOR) 80 MG tablet   Oral   Take 1 tablet (80 mg total) by mouth daily.   30 tablet   1   . carvedilol (COREG) 6.25 MG tablet   Oral   Take 1 tablet (6.25 mg total) by mouth 2 (two) times daily with a meal.   60 tablet   1   . citalopram (CELEXA) 10 MG tablet   Oral   Take 1 tablet (10 mg total) by mouth daily.   30 tablet   1   . clopidogrel (PLAVIX) 75 MG tablet   Oral   Take 1 tablet (75 mg total) by mouth daily with breakfast.   30 tablet   1   . gabapentin (NEURONTIN) 300 MG capsule   Oral   Take 1 capsule (300 mg total) by mouth 3 (three) times daily.   90 capsule   1   . glimepiride (AMARYL) 4 MG tablet   Oral   Take 1 tablet (4 mg total) by mouth daily with breakfast.   30 tablet   1   .  lisinopril (PRINIVIL,ZESTRIL) 5 MG tablet   Oral   Take 1 tablet (5 mg total) by mouth daily.   30 tablet   1   . metFORMIN (GLUCOPHAGE) 500 MG tablet   Oral   Take 1 tablet (500 mg total) by mouth 2 (two) times daily with a meal.   60 tablet   1   . senna-docusate (SENOKOT-S) 8.6-50 MG per tablet   Oral   Take 2 tablets by mouth at bedtime. For constipation   60 tablet   1     Allergies Review of patient's allergies indicates no known allergies.  Family History  Problem Relation Age of Onset  . Stroke Father   . Heart disease Father     Social History History  Substance Use Topics  . Smoking status: Former Games developer  . Smokeless tobacco: Not on file  . Alcohol Use: 4.8 oz/week    4 Shots of liquor, 4 Cans of beer per week    Review of Systems Constitutional: No fever/chills Eyes: No visual changes. ENT: No sore throat. Cardiovascular: chest pain. Respiratory: Denies shortness of breath. Gastrointestinal: No abdominal pain.  No nausea, no vomiting.    Genitourinary: Negative for dysuria. Musculoskeletal: Negative for back pain. Skin: Negative for rash. Neurological: Negative for headaches, history of left-sided weakness  10-point ROS otherwise negative.  ____________________________________________   PHYSICAL EXAM:  VITAL SIGNS: ED Triage Vitals  Enc Vitals Group     BP 11/27/14 2241 185/119 mmHg     Pulse Rate 11/27/14 2241 72     Resp 11/27/14 2241 18     Temp 11/27/14 2241 97.9 F (36.6 C)     Temp Source 11/27/14 2241 Oral     SpO2 11/27/14 2241 99 %     Weight 11/27/14 2241 289 lb (131.09 kg)     Height 11/27/14 2241 6\' 1"  (1.854 m)     Head Cir --      Peak Flow --      Pain Score 11/27/14 2243 10     Pain Loc --      Pain Edu? --      Excl. in GC? --     Constitutional: Alert and oriented. Well appearing and in mild distress. Eyes: Conjunctivae are normal. Disconjugate gaze, pupils are equal round and reactive to light, extraocular muscles intact Head: Atraumatic. Nose: No congestion/rhinnorhea. Mouth/Throat: Mucous membranes are moist.  Oropharynx non-erythematous. Neck: No cervical spine tenderness to palpation. Cardiovascular: Normal rate, regular rhythm. Grossly normal heart sounds.  Good peripheral circulation. Respiratory: Normal respiratory effort.  No retractions. Lungs CTAB. Gastrointestinal: Soft and nontender. No distention. Positive bowel sounds Genitourinary: Deferred Musculoskeletal: No lower extremity tenderness nor edema.  No joint effusions. Neurologic:  Slow and halting speech, right upper extremity weakness  Skin:  Skin is warm, dry and intact. No rash noted. Psychiatric: Mood and affect are normal.   ____________________________________________   LABS (all labs ordered are listed, but only abnormal results are displayed)  Labs Reviewed  GLUCOSE, CAPILLARY - Abnormal; Notable for the following:    Glucose-Capillary 59 (*)    All other components within normal limits  CBC -  Abnormal; Notable for the following:    RDW 15.0 (*)    All other components within normal limits  COMPREHENSIVE METABOLIC PANEL - Abnormal; Notable for the following:    Calcium 8.7 (*)    ALT 16 (*)    Anion gap 4 (*)    All other components within normal limits  ETHANOL - Abnormal; Notable for the following:    Alcohol, Ethyl (B) 102 (*)    All other components within normal limits  GLUCOSE, CAPILLARY  URINALYSIS COMPLETEWITH MICROSCOPIC ([facility])   CBG MONITORING, ED  CBG MONITORING, ED  CBG MONITORING, ED   ____________________________________________  EKG  None ____________________________________________  RADIOLOGY  Chest x-ray: Negative ____________________________________________   PROCEDURES  Procedure(s) performed: None  Critical Care performed: No  ____________________________________________   INITIAL IMPRESSION / ASSESSMENT AND PLAN / ED COURSE  Pertinent labs & imaging results that were available during my care of the patient were reviewed by me and considered in my medical decision making (see chart for details).  This is a 44 year old male with a history of CVA who comes in today with a fall after not using his walker and drinking today. The patient does have some right-sided chest pain and did have a low blood sugar. We will evaluate the patient's blood work to determine if he needs any further studies.  The patient has been sleeping comfortably while in the emergency department. The patient's blood work is unremarkable. We did give hit the patient some food and his blood sugars or improved prior to the food. The patient's recheck of his blood sugar was 71. I will discharge the patient to home to follow-up with his primary care physician. ____________________________________________   FINAL CLINICAL IMPRESSION(S) / ED DIAGNOSES  Final diagnoses:  Rib pain on right side  Fall, initial encounter      Rebecka Apley, MD 11/28/14 [phone]

## 2014-11-28 DIAGNOSIS — S299XXA Unspecified injury of thorax, initial encounter: Secondary | ICD-10-CM | POA: Diagnosis not present

## 2014-11-28 LAB — GLUCOSE, CAPILLARY: Glucose-Capillary: 71 mg/dL (ref 65–99)

## 2014-11-28 LAB — ETHANOL: ALCOHOL ETHYL (B): 102 mg/dL — AB (ref <5)

## 2014-11-28 NOTE — ED Notes (Signed)
EMS called for transport.

## 2014-11-28 NOTE — ED Notes (Addendum)
Pt home numbers called and message left: [phone] and at family contacts

## 2014-11-28 NOTE — Discharge Instructions (Signed)
Chest Wall Pain Chest wall pain is pain felt in or around the chest bones and muscles. It may take up to 6 weeks to get better. It may take longer if you are active. Chest wall pain can happen on its own. Other times, things like germs, injury, coughing, or exercise can cause the pain. HOME CARE   Avoid activities that make you tired or cause pain. Try not to use your chest, belly (abdominal), or side muscles. Do not use heavy weights.  Put ice on the sore area.  Put ice in a plastic bag.  Place a towel between your skin and the bag.  Leave the ice on for 15-20 minutes for the first 2 days.  Only take medicine as told by your doctor. GET HELP RIGHT AWAY IF:   You have more pain or are very uncomfortable.  You have a fever.  Your chest pain gets worse.  You have new problems.  You feel sick to your stomach (nauseous) or throw up (vomit).  You start to sweat or feel lightheaded.  You have a cough with mucus (phlegm).  You cough up blood. MAKE SURE YOU:   Understand these instructions.  Will watch your condition.  Will get help right away if you are not doing well or get worse. Document Released: 12/11/2007 Document Revised: 09/16/2011 Document Reviewed: 02/18/2011 [facility] Patient Information 2015 [place], [place]. This information is not intended to replace advice given to you by your health care provider. Make sure you discuss any questions you have with your health care provider.  Fall Prevention and Home Safety Falls cause injuries and can affect all age groups. It is possible to prevent falls.  HOW TO PREVENT FALLS  Wear shoes with rubber soles that do not have an opening for your toes.  Keep the inside and outside of your house well lit.  Use night lights throughout your home.  Remove clutter from floors.  Clean up floor spills.  Remove throw rugs or fasten them to the floor with carpet tape.  Do not place electrical cords across pathways.  Put grab bars  by your tub, shower, and toilet. Do not use towel bars as grab bars.  Put handrails on both sides of the stairway. Fix loose handrails.  Do not climb on stools or stepladders, if possible.  Do not wax your floors.  Repair uneven or unsafe sidewalks, walkways, or stairs.  Keep items you use a lot within reach.  Be aware of pets.  Keep emergency numbers next to the telephone.  Put smoke detectors in your home and near bedrooms. Ask your doctor what other things you can do to prevent falls. Document Released: 04/20/2009 Document Revised: 12/24/2011 Document Reviewed: 09/24/2011 [facility] Patient Information 2015 [place], [place]. This information is not intended to replace advice given to you by your health care provider. Make sure you discuss any questions you have with your health care provider.

## 2015-05-04 ENCOUNTER — Emergency Department
Admission: EM | Admit: 2015-05-04 | Discharge: 2015-05-05 | Disposition: A | Discharge status: Home / Self Care | Payer: Medicare Other | Attending: Emergency Medicine | Admitting: Emergency Medicine

## 2015-05-04 ENCOUNTER — Encounter: Payer: Self-pay | Admitting: Emergency Medicine

## 2015-05-04 DIAGNOSIS — Z79899 Other long term (current) drug therapy: Secondary | ICD-10-CM | POA: Diagnosis not present

## 2015-05-04 DIAGNOSIS — Z87891 Personal history of nicotine dependence: Secondary | ICD-10-CM | POA: Diagnosis not present

## 2015-05-04 DIAGNOSIS — G918 Other hydrocephalus: Secondary | ICD-10-CM | POA: Diagnosis not present

## 2015-05-04 DIAGNOSIS — I10 Essential (primary) hypertension: Secondary | ICD-10-CM | POA: Insufficient documentation

## 2015-05-04 DIAGNOSIS — Z7982 Long term (current) use of aspirin: Secondary | ICD-10-CM | POA: Diagnosis not present

## 2015-05-04 DIAGNOSIS — E119 Type 2 diabetes mellitus without complications: Secondary | ICD-10-CM | POA: Insufficient documentation

## 2015-05-04 DIAGNOSIS — R32 Unspecified urinary incontinence: Secondary | ICD-10-CM | POA: Diagnosis present

## 2015-05-04 DIAGNOSIS — G919 Hydrocephalus, unspecified: Secondary | ICD-10-CM

## 2015-05-04 NOTE — ED Notes (Signed)
Pt presents to ED via EMS from personal home with c/o of urinary incontinence. EMS states pt has a hx of CVA in the past approximately x1 year ago. EMS states pt has been consuming ETOH this evening and has experienced presenting sx approximately x2 hours ago. EMS states presenting sx is new onset and pt has current right side weakness deficit from CVA. Pt alert and oriented x4.

## 2015-05-05 ENCOUNTER — Emergency Department: Payer: Medicare Other

## 2015-05-05 DIAGNOSIS — G918 Other hydrocephalus: Secondary | ICD-10-CM | POA: Diagnosis not present

## 2015-05-05 LAB — COMPREHENSIVE METABOLIC PANEL
ALK PHOS: 63 U/L (ref 38–126)
ALT: 12 U/L — AB (ref 17–63)
ANION GAP: 8 (ref 5–15)
AST: 15 U/L (ref 15–41)
Albumin: 4.3 g/dL (ref 3.5–5.0)
BUN: 10 mg/dL (ref 6–20)
CALCIUM: 9.1 mg/dL (ref 8.9–10.3)
CHLORIDE: 107 mmol/L (ref 101–111)
CO2: 25 mmol/L (ref 22–32)
Creatinine, Ser: 0.99 mg/dL (ref 0.61–1.24)
Glucose, Bld: 91 mg/dL (ref 65–99)
POTASSIUM: 3.8 mmol/L (ref 3.5–5.1)
Sodium: 140 mmol/L (ref 135–145)
Total Bilirubin: 0.4 mg/dL (ref 0.3–1.2)
Total Protein: 7.3 g/dL (ref 6.5–8.1)

## 2015-05-05 LAB — URINE DRUG SCREEN, QUALITATIVE (ARMC ONLY)
AMPHETAMINES, UR SCREEN: NOT DETECTED
Barbiturates, Ur Screen: NOT DETECTED
Benzodiazepine, Ur Scrn: NOT DETECTED
COCAINE METABOLITE, UR ~~LOC~~: NOT DETECTED
Cannabinoid 50 Ng, Ur ~~LOC~~: NOT DETECTED
MDMA (Ecstasy)Ur Screen: NOT DETECTED
METHADONE SCREEN, URINE: NOT DETECTED
Opiate, Ur Screen: NOT DETECTED
Phencyclidine (PCP) Ur S: NOT DETECTED
TRICYCLIC, UR SCREEN: NOT DETECTED

## 2015-05-05 LAB — CBC
HCT: 41.6 % (ref 40.0–52.0)
HEMOGLOBIN: 13.7 g/dL (ref 13.0–18.0)
MCH: 29 pg (ref 26.0–34.0)
MCHC: 32.9 g/dL (ref 32.0–36.0)
MCV: 88.2 fL (ref 80.0–100.0)
Platelets: 216 10*3/uL (ref 150–440)
RBC: 4.72 MIL/uL (ref 4.40–5.90)
RDW: 13.9 % (ref 11.5–14.5)
WBC: 4.6 10*3/uL (ref 3.8–10.6)

## 2015-05-05 LAB — ETHANOL: Alcohol, Ethyl (B): 42 mg/dL — ABNORMAL HIGH (ref <5)

## 2015-05-05 NOTE — ED Notes (Signed)
Attempted to call home residence and guardian, without success. Phone calls sent to voicemail. Made MD aware.

## 2015-05-05 NOTE — ED Notes (Signed)
Attempted to call home residence and guardian, without success. Phone call sent to voicemail. Made MD aware.

## 2015-05-05 NOTE — ED Provider Notes (Signed)
[facility] Emergency Department Provider Note  ____________________________________________  Time seen: 12:30 AM  I have reviewed the triage vital signs and the nursing notes.   HISTORY  Chief Complaint Urinary Incontinence     HPI [name] is a 44 y.o. male presents via EMS from his home for urinary incontinence. EMS personnel states that the patient's brother requested that he be transferred to emergency department or concern of possible stroke. Patient has a history of a CVA approximately one year ago. Patient admits to consuming approximately 2-1/2 40 ounce bottles of beer to today. Patient denies any complaints at this time. Patient states no headache no new weakness numbness. Patient states "my brother just wanted to get me out of the house so he could spend my money on my card".     Past Medical History  Diagnosis Date  . Hypertension   . Diabetes mellitus without complication (HCC)   . Seizures (HCC)   . Stroke [facility])     Patient Active Problem List   Diagnosis Date Noted  . CVA (cerebral infarction) 12/17/2013  . HTN (hypertension) 12/17/2013  . Diabetes (HCC) 12/17/2013  . Cardiomyopathy (HCC) 12/17/2013    Past Surgical History  Procedure Laterality Date  . Loop recorder implant  12/20/13    MDT LinQ implanted by Dr Ladona Ridgel for cryptogenic stroke  . Loop recorder implant N/A 12/20/2013    Procedure: LOOP RECORDER IMPLANT;  Surgeon: Marinus Maw, MD;  Location: [facility] CATH LAB;  Service: Cardiovascular;  Laterality: N/A;    Current Outpatient Rx  Name  Route  Sig  Dispense  Refill  . amLODipine (NORVASC) 10 MG tablet   Oral   Take 1 tablet (10 mg total) by mouth daily.   30 tablet   1   . aspirin 81 MG chewable tablet   Oral   Chew 1 tablet (81 mg total) by mouth daily.   30 tablet   1   . atorvastatin (LIPITOR) 80 MG tablet   Oral   Take 1 tablet (80 mg total) by mouth daily.   30 tablet   1   . carvedilol (COREG)  6.25 MG tablet   Oral   Take 1 tablet (6.25 mg total) by mouth 2 (two) times daily with a meal.   60 tablet   1   . citalopram (CELEXA) 10 MG tablet   Oral   Take 1 tablet (10 mg total) by mouth daily.   30 tablet   1   . clopidogrel (PLAVIX) 75 MG tablet   Oral   Take 1 tablet (75 mg total) by mouth daily with breakfast.   30 tablet   1   . gabapentin (NEURONTIN) 300 MG capsule   Oral   Take 1 capsule (300 mg total) by mouth 3 (three) times daily.   90 capsule   1   . glimepiride (AMARYL) 4 MG tablet   Oral   Take 1 tablet (4 mg total) by mouth daily with breakfast.   30 tablet   1   . GLIPIZIDE XL 10 MG 24 hr tablet   Oral   Take 1 tablet by mouth 2 (two) times daily.           Dispense as written.   Marland Kitchen lisinopril (PRINIVIL,ZESTRIL) 5 MG tablet   Oral   Take 1 tablet (5 mg total) by mouth daily.   30 tablet   1   . metFORMIN (GLUCOPHAGE) 500 MG tablet  Oral   Take 1 tablet (500 mg total) by mouth 2 (two) times daily with a meal.   60 tablet   1   . senna-docusate (SENOKOT-S) 8.6-50 MG per tablet   Oral   Take 2 tablets by mouth at bedtime. For constipation   60 tablet   1     Allergies Review of patient's allergies indicates no known allergies.  Family History  Problem Relation Age of Onset  . Stroke Father   . Heart disease Father     Social History Social History  Substance Use Topics  . Smoking status: Former Games developer  . Smokeless tobacco: None  . Alcohol Use: 4.8 oz/week    4 Shots of liquor, 4 Cans of beer per week    Review of Systems  Constitutional: Negative for fever. Eyes: Negative for visual changes. ENT: Negative for sore throat. Cardiovascular: Negative for chest pain. Respiratory: Negative for shortness of breath. Gastrointestinal: Negative for abdominal pain, vomiting and diarrhea. Genitourinary: Negative for dysuria. Musculoskeletal: Negative for back pain. Skin: Negative for rash. Neurological: Negative for  headaches, focal weakness or numbness.   10-point ROS otherwise negative.  ____________________________________________   PHYSICAL EXAM:  VITAL SIGNS: ED Triage Vitals  Enc Vitals Group     BP 05/04/15 2333 141/92 mmHg     Pulse Rate 05/04/15 2333 70     Resp 05/04/15 2333 19     Temp 05/04/15 2333 98.6 F (37 C)     Temp Source 05/04/15 2333 Oral     SpO2 05/04/15 2333 98 %     Weight 05/04/15 2333 288 lb (130.636 kg)     Height 05/04/15 2333 6\' 1"  (1.854 m)     Head Cir --      Peak Flow --      Pain Score --      Pain Loc --      Pain Edu? --      Excl. in GC? --     Constitutional: Alert and oriented. Well appearing and in no distress. Eyes: Conjunctivae are normal. PERRL. Normal extraocular movements. ENT   Head: Normocephalic and atraumatic.   Nose: No congestion/rhinnorhea.   Mouth/Throat: Mucous membranes are moist.   Neck: No stridor. Hematological/Lymphatic/Immunilogical: No cervical lymphadenopathy. Cardiovascular: Normal rate, regular rhythm. Normal and symmetric distal pulses are present in all extremities. No murmurs, rubs, or gallops. Respiratory: Normal respiratory effort without tachypnea nor retractions. Breath sounds are clear and equal bilaterally. No wheezes/rales/rhonchi. Gastrointestinal: Soft and nontender. No distention. There is no CVA tenderness. Genitourinary: deferred Musculoskeletal: Nontender with normal range of motion in all extremities. No joint effusions.  No lower extremity tenderness nor edema. Neurologic:  Normal speech and language. Positive right arm/leg weakness "normal" per patient Speech is normal.  Skin:  Skin is warm, dry and intact. No rash noted. Psychiatric: Mood and affect are normal. Speech and behavior are normal. Patient exhibits appropriate insight and judgment.  ____________________________________________    LABS (pertinent positives/negatives)  Labs Reviewed  COMPREHENSIVE METABOLIC PANEL -  Abnormal; Notable for the following:    ALT 12 (*)    All other components within normal limits  ETHANOL - Abnormal; Notable for the following:    Alcohol, Ethyl (B) 42 (*)    All other components within normal limits  CBC  URINE DRUG SCREEN, QUALITATIVE ([facility] ONLY)        RADIOLOGY    CT Head Wo Contrast (Final result) Result time: 05/05/15 04:29:56   Final result by  Rad Results In Interface (05/05/15 04:29:56)   Narrative:   CLINICAL DATA: Urinary incontinence. History of stroke 1 year ago. History of seizures, hypertension, diabetes.  EXAM: CT HEAD WITHOUT CONTRAST  TECHNIQUE: Contiguous axial images were obtained from the base of the skull through the vertex without intravenous contrast.  COMPARISON: CT head July 07, 2014  FINDINGS: Severe ventriculomegaly, unchanged from prior imaging with diffuse mild disproportionate sulcal effacement. Fourth ventricle is normal in size. No intraparenchymal hemorrhage, mass effect, midline shift. Somewhat small LEFT cerebral peduncle suggests a component wallerian degeneration. Old LEFT basal ganglia cystic infarct. No abnormal extra-axial fluid collections. Basal cisterns are patent.  Chronic sellar expansion. No skull fracture. Included ocular globes and orbital contents are nonsuspicious, disc conjugate gaze may be transient. Small LEFT maxillary mucosal retention cyst, minimal paranasal sinus mucosal thickening. The mastoid air cells are well aerated.  IMPRESSION: No acute intracranial process.  Chronic changes including severe obstructive hydrocephalus and old LEFT basal ganglia infarct.   Electronically Signed By: Awilda Metro M.D. On: 05/05/2015 04:29      INITIAL IMPRESSION / ASSESSMENT AND PLAN / ED COURSE  Pertinent labs & imaging results that were available during my care of the patient were reviewed by me and considered in my medical decision making (see chart for  details).    ____________________________________________   FINAL CLINICAL IMPRESSION(S) / ED DIAGNOSES  Final diagnoses:  Chronic brain-hydrocephalus syndrome  Hydrocephalus      Darci Current, MD 05/05/15 [phone]

## 2015-05-05 NOTE — ED Notes (Signed)
Pt noted sleeping soundly.  

## 2015-08-24 ENCOUNTER — Ambulatory Visit: Payer: Self-pay | Admitting: Family Medicine

## 2015-09-07 ENCOUNTER — Ambulatory Visit: Payer: Self-pay | Admitting: Family Medicine

## 2015-09-13 ENCOUNTER — Encounter: Payer: Self-pay | Admitting: Family Medicine

## 2015-09-13 ENCOUNTER — Ambulatory Visit (INDEPENDENT_AMBULATORY_CARE_PROVIDER_SITE_OTHER): Payer: Medicare Other | Admitting: Family Medicine

## 2015-09-13 VITALS — BP 136/80 | HR 79 | Temp 98.9°F | Resp 18 | Ht 73.0 in | Wt 296.4 lb

## 2015-09-13 DIAGNOSIS — E785 Hyperlipidemia, unspecified: Secondary | ICD-10-CM

## 2015-09-13 DIAGNOSIS — I10 Essential (primary) hypertension: Secondary | ICD-10-CM

## 2015-09-13 DIAGNOSIS — G911 Obstructive hydrocephalus: Secondary | ICD-10-CM

## 2015-09-13 DIAGNOSIS — Z23 Encounter for immunization: Secondary | ICD-10-CM

## 2015-09-13 DIAGNOSIS — IMO0001 Reserved for inherently not codable concepts without codable children: Secondary | ICD-10-CM

## 2015-09-13 DIAGNOSIS — I639 Cerebral infarction, unspecified: Secondary | ICD-10-CM

## 2015-09-13 DIAGNOSIS — E1169 Type 2 diabetes mellitus with other specified complication: Secondary | ICD-10-CM | POA: Insufficient documentation

## 2015-09-13 DIAGNOSIS — G918 Other hydrocephalus: Secondary | ICD-10-CM

## 2015-09-13 DIAGNOSIS — E1165 Type 2 diabetes mellitus with hyperglycemia: Secondary | ICD-10-CM

## 2015-09-13 LAB — GLUCOSE, POCT (MANUAL RESULT ENTRY): POC Glucose: 178 mg/dl — AB (ref 70–99)

## 2015-09-13 LAB — POCT GLYCOSYLATED HEMOGLOBIN (HGB A1C): Hemoglobin A1C: 7.5

## 2015-09-13 MED ORDER — METFORMIN HCL 1000 MG PO TABS: 1000.0000 mg | ORAL_TABLET | Freq: Two times a day (BID) | ORAL | Status: DC

## 2015-09-13 NOTE — Progress Notes (Signed)
Name: [name]   MRN: [id number]    DOB: December 15, 1970   Date:09/13/2015       Progress Note  Subjective  Chief Complaint  Chief Complaint  Patient presents with  . here to est care from [facility]    Diabetes He presents for his follow-up diabetic visit. He has type 2 diabetes mellitus. His disease course has been stable. Hypoglycemia symptoms include dizziness and headaches (sometimes has a headache). Associated symptoms include polyuria. Pertinent negatives for diabetes include no blurred vision, no chest pain, no fatigue and no polydipsia. Diabetic complications include a CVA. Current diabetic treatment includes oral agent (dual therapy). An ACE inhibitor/angiotensin II receptor blocker is being taken.  Hypertension This is a chronic problem. The problem is controlled. Associated symptoms include headaches (sometimes has a headache). Pertinent negatives include no blurred vision, chest pain, palpitations or shortness of breath. Past treatments include ACE inhibitors, calcium channel blockers and beta blockers. Hypertensive end-organ damage includes CVA. There is no history of kidney disease.  Hyperlipidemia This is a chronic problem. Pertinent negatives include no chest pain, leg pain, myalgias or shortness of breath. Current antihyperlipidemic treatment includes statins.    Pt. Is here to establish care. Was seen by K. Robyne Askew at [facility]. Records not available for review.  Past Medical History  Diagnosis Date  . Hypertension   . Diabetes mellitus without complication (HCC)   . Seizures (HCC)   . Stroke (HCC)   . Hyperlipidemia   . Hydrocephalus in adult   . Depression     Past Surgical History  Procedure Laterality Date  . Loop recorder implant  12/20/13    MDT LinQ implanted by Dr Ladona Ridgel for cryptogenic stroke  . Loop recorder implant N/A 12/20/2013    Procedure: LOOP RECORDER IMPLANT;  Surgeon: Marinus Maw, MD;  Location: [facility] CATH LAB;  Service:  Cardiovascular;  Laterality: N/A;    Family History  Problem Relation Age of Onset  . Stroke Father   . Heart disease Father   . Hypertension Father   . Hypertension Mother     Social History   Social History  . Marital Status: Single    Spouse Name: N/A  . Number of Children: N/A  . Years of Education: N/A   Occupational History  . Not on file.   Social History Main Topics  . Smoking status: Former Games developer  . Smokeless tobacco: Not on file  . Alcohol Use: 0.6 oz/week    1 Cans of beer per week  . Drug Use: No  . Sexual Activity: No   Other Topics Concern  . Not on file   Social History Narrative     Current outpatient prescriptions:  .  amLODipine (NORVASC) 10 MG tablet, Take 1 tablet (10 mg total) by mouth daily., Disp: 30 tablet, Rfl: 1 .  aspirin 81 MG chewable tablet, Chew 1 tablet (81 mg total) by mouth daily., Disp: 30 tablet, Rfl: 1 .  atorvastatin (LIPITOR) 80 MG tablet, Take 1 tablet (80 mg total) by mouth daily., Disp: 30 tablet, Rfl: 1 .  carvedilol (COREG) 6.25 MG tablet, Take 1 tablet (6.25 mg total) by mouth 2 (two) times daily with a meal., Disp: 60 tablet, Rfl: 1 .  citalopram (CELEXA) 10 MG tablet, Take 1 tablet (10 mg total) by mouth daily., Disp: 30 tablet, Rfl: 1 .  clopidogrel (PLAVIX) 75 MG tablet, Take 1 tablet (75 mg total) by mouth daily with breakfast., Disp: 30 tablet,  Rfl: 1 .  gabapentin (NEURONTIN) 300 MG capsule, Take 1 capsule (300 mg total) by mouth 3 (three) times daily., Disp: 90 capsule, Rfl: 1 .  GLIPIZIDE XL 10 MG 24 hr tablet, Take 1 tablet by mouth 2 (two) times daily., Disp: , Rfl:  .  lisinopril (PRINIVIL,ZESTRIL) 5 MG tablet, Take 1 tablet (5 mg total) by mouth daily., Disp: 30 tablet, Rfl: 1 .  metFORMIN (GLUCOPHAGE) 500 MG tablet, Take 1 tablet (500 mg total) by mouth 2 (two) times daily with a meal., Disp: 60 tablet, Rfl: 1 .  senna-docusate (SENOKOT-S) 8.6-50 MG per tablet, Take 2 tablets by mouth at bedtime. For  constipation (Patient not taking: Reported on 09/13/2015), Disp: 60 tablet, Rfl: 1  No Known Allergies   Review of Systems  Constitutional: Negative for fatigue.  Eyes: Negative for blurred vision.  Respiratory: Negative for shortness of breath.   Cardiovascular: Negative for chest pain and palpitations.  Musculoskeletal: Negative for myalgias.  Neurological: Positive for dizziness, speech change and headaches (sometimes has a headache).  Endo/Heme/Allergies: Negative for polydipsia.    Objective  Filed Vitals:   09/13/15 1524  BP: 136/80  Pulse: 79  Temp: 98.9 F (37.2 C)  Resp: 18  Height: 6\' 1"  (1.854 m)  Weight: 296 lb 7 oz (134.463 kg)  SpO2: 97%    Physical Exam  Constitutional: He is well-developed, well-nourished, and in no distress.  HENT:  Head: Normocephalic.  Cardiovascular: Normal rate and regular rhythm.   Pulmonary/Chest: Breath sounds normal.  Abdominal: Normal appearance and bowel sounds are normal.  Neurological: He is alert. He displays abnormal speech (Dysphasia, searching for words or names of people.).  Skin: Skin is warm.  Nursing note and vitals reviewed.     Recent Results (from the past 2160 hour(s))  POCT Glucose (CBG)     Status: Abnormal   Collection Time: 09/13/15  3:38 PM  Result Value Ref Range   POC Glucose 178 (A) 70 - 99 mg/dl  POCT HgB Q7R     Status: None   Collection Time: 09/13/15  3:41 PM  Result Value Ref Range   Hemoglobin A1C 7.5      Assessment & Plan  1. Uncontrolled type 2 diabetes mellitus without complication, without long-term current use of insulin (HCC) We will increase metformin to 1g twice daily for poorly controlled diabetes. - POCT HgB A1C - POCT Glucose (CBG) - metFORMIN (GLUCOPHAGE) 1000 MG tablet; Take 1 tablet (1,000 mg total) by mouth 2 (two) times daily with a meal.  Dispense: 60 tablet; Refill: 2  2. Essential hypertension Blood pressure at goal  3. Chronic brain-hydrocephalus syndrome  -  Ambulatory referral to Neurology  4. Cerebral infarction due to unspecified mechanism Pt. apparently had a stroke sometime ago.not seen a neurologist. We'll refer to neurology for further assessment - Ambulatory referral to Neurology  5. Hyperlipidemia  - Lipid Profile - Comprehensive Metabolic Panel (CMET)  6. Need for immunization against influenza  - Flu Vaccine QUAD 36+ mos PF IM (Fluarix & Fluzone Quad PF)    Davina Howlett [name] 09/13/2015 3:51 PM

## 2015-09-19 DIAGNOSIS — Z23 Encounter for immunization: Secondary | ICD-10-CM

## 2015-09-27 LAB — LIPID PANEL
CHOLESTEROL: 102 mg/dL (ref 0–200)
LDL CALC: 43 mg/dL
TRIGLYCERIDES: 101 mg/dL (ref 40–160)

## 2015-09-27 LAB — HEMOGLOBIN A1C: Hemoglobin A1C: 6.5

## 2015-10-03 ENCOUNTER — Encounter: Payer: Self-pay | Admitting: Family Medicine

## 2015-10-06 ENCOUNTER — Telehealth: Payer: Self-pay | Admitting: Family Medicine

## 2015-10-06 DIAGNOSIS — E1165 Type 2 diabetes mellitus with hyperglycemia: Principal | ICD-10-CM

## 2015-10-06 DIAGNOSIS — IMO0001 Reserved for inherently not codable concepts without codable children: Secondary | ICD-10-CM

## 2015-10-06 MED ORDER — METFORMIN HCL 1000 MG PO TABS
1000.0000 mg | ORAL_TABLET | Freq: Two times a day (BID) | ORAL | Status: DC
Start: 2015-10-06 — End: 2018-08-12

## 2015-10-06 NOTE — Telephone Encounter (Signed)
Metformin 1000 mg has been refilled and sent to [name] it was previously sent to [facility] by mistake, but has been corrected now and sent to right pharmacy

## 2015-10-06 NOTE — Telephone Encounter (Signed)
Pt was seen last week and the dr changed his metformin to 500mg  to 1000 mg and was to be sent to pharm at [facility] and he is there and they do not have it and he can not get anything due to this. Please take a look at this please for he is needing this. Says that phar closes at 1pm and he can not even get his other medications for they put them in blister packages.

## 2015-10-12 LAB — COMPREHENSIVE METABOLIC PANEL
ALT: 25 IU/L (ref 0–44)
AST: 17 IU/L (ref 0–40)
Albumin/Globulin Ratio: 1.9 (ref 1.2–2.2)
Albumin: 4.5 g/dL (ref 3.5–5.5)
Alkaline Phosphatase: 81 IU/L (ref 39–117)
BUN/Creatinine Ratio: 13 (ref 9–20)
BUN: 13 mg/dL (ref 6–24)
Bilirubin Total: 0.6 mg/dL (ref 0.0–1.2)
CO2: 24 mmol/L (ref 18–29)
Calcium: 9.6 mg/dL (ref 8.7–10.2)
Chloride: 98 mmol/L (ref 96–106)
Creatinine, Ser: 1.04 mg/dL (ref 0.76–1.27)
GFR calc Af Amer: 101 mL/min/{1.73_m2} (ref >59)
GFR calc non Af Amer: 88 mL/min/{1.73_m2} (ref >59)
Globulin, Total: 2.4 g/dL (ref 1.5–4.5)
Glucose: 199 mg/dL — ABNORMAL HIGH (ref 65–99)
Potassium: 4.3 mmol/L (ref 3.5–5.2)
Sodium: 142 mmol/L (ref 134–144)
Total Protein: 6.9 g/dL (ref 6.0–8.5)

## 2015-10-12 LAB — LIPID PANEL
CHOLESTEROL TOTAL: 119 mg/dL (ref 100–199)
Chol/HDL Ratio: 2.5 ratio units (ref 0.0–5.0)
HDL: 48 mg/dL (ref >39)
LDL CALC: 47 mg/dL (ref 0–99)
Triglycerides: 121 mg/dL (ref 0–149)
VLDL Cholesterol Cal: 24 mg/dL (ref 5–40)

## 2015-10-16 ENCOUNTER — Other Ambulatory Visit: Payer: Self-pay | Admitting: Family Medicine

## 2015-10-16 ENCOUNTER — Encounter: Payer: Self-pay | Admitting: Family Medicine

## 2015-10-16 ENCOUNTER — Ambulatory Visit (INDEPENDENT_AMBULATORY_CARE_PROVIDER_SITE_OTHER): Payer: Medicare Other | Admitting: Family Medicine

## 2015-10-16 VITALS — BP 126/74 | HR 90 | Temp 98.7°F | Resp 16 | Ht 73.0 in | Wt 294.7 lb

## 2015-10-16 DIAGNOSIS — R0989 Other specified symptoms and signs involving the circulatory and respiratory systems: Secondary | ICD-10-CM | POA: Diagnosis not present

## 2015-10-16 DIAGNOSIS — Z23 Encounter for immunization: Secondary | ICD-10-CM | POA: Diagnosis not present

## 2015-10-16 DIAGNOSIS — E1165 Type 2 diabetes mellitus with hyperglycemia: Secondary | ICD-10-CM | POA: Diagnosis not present

## 2015-10-16 DIAGNOSIS — I639 Cerebral infarction, unspecified: Secondary | ICD-10-CM | POA: Diagnosis not present

## 2015-10-16 DIAGNOSIS — E669 Obesity, unspecified: Secondary | ICD-10-CM | POA: Insufficient documentation

## 2015-10-16 DIAGNOSIS — IMO0002 Reserved for concepts with insufficient information to code with codable children: Secondary | ICD-10-CM

## 2015-10-16 DIAGNOSIS — IMO0001 Reserved for inherently not codable concepts without codable children: Secondary | ICD-10-CM

## 2015-10-16 DIAGNOSIS — E1142 Type 2 diabetes mellitus with diabetic polyneuropathy: Secondary | ICD-10-CM | POA: Diagnosis not present

## 2015-10-16 NOTE — Progress Notes (Signed)
Name: [name]   MRN: [id number]    DOB: 28-May-1971   Date:10/16/2015       Progress Note  Subjective  Chief Complaint  Chief Complaint  Patient presents with  . Follow-up    1 month, will see Neurology May 15th   . Diabetes    Increased Metformin dosage, Average-120-140. Has been walking more frequently to help with activity.  Would like to talk about getting Diabetic Shoes.     Diabetes He presents for his follow-up diabetic visit. He has type 2 diabetes mellitus. His disease course has been stable. Associated symptoms include foot paresthesias (intermittent). Pertinent negatives for diabetes include no blurred vision and no chest pain. Current diabetic treatment includes oral agent (dual therapy). Frequency home blood tests: Not checking his BG. COuld not obtain the strips for the meter. An ACE inhibitor/angiotensin II receptor blocker is being taken. He does not see a podiatrist.Eye exam is not current.     Past Medical History  Diagnosis Date  . Hypertension   . Diabetes mellitus without complication (HCC)   . Seizures (HCC)   . Stroke (HCC)   . Hyperlipidemia   . Hydrocephalus in adult   . Depression     Past Surgical History  Procedure Laterality Date  . Loop recorder implant  12/20/13    MDT LinQ implanted by Dr Ladona Ridgel for cryptogenic stroke  . Loop recorder implant N/A 12/20/2013    Procedure: LOOP RECORDER IMPLANT;  Surgeon: Marinus Maw, MD;  Location: [facility] CATH LAB;  Service: Cardiovascular;  Laterality: N/A;    Family History  Problem Relation Age of Onset  . Stroke Father   . Heart disease Father   . Hypertension Father   . Hypertension Mother     Social History   Social History  . Marital Status: Single    Spouse Name: N/A  . Number of Children: N/A  . Years of Education: N/A   Occupational History  . Not on file.   Social History Main Topics  . Smoking status: Former Games developer  . Smokeless tobacco: Not on file  . Alcohol Use: 0.6 oz/week   1 Cans of beer per week  . Drug Use: No  . Sexual Activity: No   Other Topics Concern  . Not on file   Social History Narrative     Current outpatient prescriptions:  .  amLODipine (NORVASC) 10 MG tablet, Take 1 tablet (10 mg total) by mouth daily., Disp: 30 tablet, Rfl: 1 .  aspirin 81 MG chewable tablet, Chew 1 tablet (81 mg total) by mouth daily., Disp: 30 tablet, Rfl: 1 .  atorvastatin (LIPITOR) 80 MG tablet, Take 1 tablet (80 mg total) by mouth daily., Disp: 30 tablet, Rfl: 1 .  carvedilol (COREG) 6.25 MG tablet, Take 1 tablet (6.25 mg total) by mouth 2 (two) times daily with a meal., Disp: 60 tablet, Rfl: 1 .  citalopram (CELEXA) 10 MG tablet, Take 1 tablet (10 mg total) by mouth daily., Disp: 30 tablet, Rfl: 1 .  clopidogrel (PLAVIX) 75 MG tablet, Take 1 tablet (75 mg total) by mouth daily with breakfast., Disp: 30 tablet, Rfl: 1 .  gabapentin (NEURONTIN) 300 MG capsule, Take 1 capsule (300 mg total) by mouth 3 (three) times daily., Disp: 90 capsule, Rfl: 1 .  GLIPIZIDE XL 10 MG 24 hr tablet, Take 1 tablet by mouth 2 (two) times daily., Disp: , Rfl:  .  lisinopril (PRINIVIL,ZESTRIL) 5 MG tablet, Take 1 tablet (5 mg  total) by mouth daily., Disp: 30 tablet, Rfl: 1 .  metFORMIN (GLUCOPHAGE) 1000 MG tablet, Take 1 tablet (1,000 mg total) by mouth 2 (two) times daily with a meal., Disp: 60 tablet, Rfl: 2 .  senna-docusate (SENOKOT-S) 8.6-50 MG per tablet, Take 2 tablets by mouth at bedtime. For constipation, Disp: 60 tablet, Rfl: 1  No Known Allergies   Review of Systems  Constitutional: Negative for fever and chills.  Eyes: Negative for blurred vision.  Cardiovascular: Negative for chest pain.  Gastrointestinal: Negative for abdominal pain.  Genitourinary: Negative for dysuria.  Neurological: Positive for tingling.     Objective  Filed Vitals:   10/16/15 1333  BP: 126/74  Pulse: 90  Temp: 98.7 F (37.1 C)  TempSrc: Oral  Resp: 16  Height:  (1.854 m)  Weight:  294 lb 11.2 oz (133.675 kg)  SpO2: 97%    Physical Exam  Constitutional: He is oriented to person, place, and time and well-developed, well-nourished, and in no distress.  Neurological: He is alert and oriented to person, place, and time.  Nursing note and vitals reviewed.    Assessment & Plan  1. Absent pedal pulses Not able to palpate dorsalis pedis and posterior tibialis pulses. Referral to vascular surgery for further assessment - Ambulatory referral to Vascular Surgery  2. Obesity, Class II, BMI 35-39.9, with comorbidity (HCC)  - Amb ref to Medical Nutrition Therapy-MNT  3. Uncontrolled type 2 diabetes mellitus with peripheral neuropathy (HCC) A1c of 7.5%, continue on metformin and glipizide. Obtain urine microalbumin and referral to ophthalmology. - Ambulatory referral to Ophthalmology - POCT UA - Microalbumin   [name] [name] A[name] 10/16/2015 1:44 PM

## 2015-10-18 ENCOUNTER — Telehealth: Payer: Self-pay | Admitting: Family Medicine

## 2015-10-18 NOTE — Telephone Encounter (Signed)
[facility] NEEDS DEMO NAD INS CARDS ON ALL REFERRALS. THEY NEED ONE ON THIS PT. FAX [phone]

## 2015-10-19 NOTE — Telephone Encounter (Signed)
Info faxed

## 2015-10-27 ENCOUNTER — Other Ambulatory Visit: Payer: Self-pay | Admitting: Emergency Medicine

## 2015-10-27 ENCOUNTER — Ambulatory Visit: Payer: Medicare Other | Admitting: *Deleted

## 2015-10-27 MED ORDER — ACCU-CHEK AVIVA DEVI: Status: AC

## 2015-10-27 MED ORDER — GLUCOSE BLOOD VI STRP: ORAL_STRIP | Status: DC

## 2015-10-27 MED ORDER — ACCU-CHEK SOFTCLIX LANCET DEV MISC: Status: DC

## 2015-12-18 ENCOUNTER — Encounter (INDEPENDENT_AMBULATORY_CARE_PROVIDER_SITE_OTHER): Payer: Self-pay

## 2015-12-18 ENCOUNTER — Encounter: Payer: Self-pay | Admitting: Family Medicine

## 2015-12-18 ENCOUNTER — Ambulatory Visit (INDEPENDENT_AMBULATORY_CARE_PROVIDER_SITE_OTHER): Payer: Medicare Other | Admitting: Family Medicine

## 2015-12-18 VITALS — BP 120/78 | HR 80 | Temp 98.2°F | Resp 17 | Ht 73.0 in | Wt 297.1 lb

## 2015-12-18 DIAGNOSIS — G8929 Other chronic pain: Secondary | ICD-10-CM | POA: Insufficient documentation

## 2015-12-18 DIAGNOSIS — I639 Cerebral infarction, unspecified: Secondary | ICD-10-CM

## 2015-12-18 DIAGNOSIS — M25571 Pain in right ankle and joints of right foot: Secondary | ICD-10-CM | POA: Diagnosis not present

## 2015-12-18 NOTE — Progress Notes (Signed)
Name: [name]   MRN: [id number]    DOB: Dec 02, 1970   Date:12/18/2015       Progress Note  Subjective  Chief Complaint  Chief Complaint  Patient presents with  . Follow-up    2 mo  . Diabetes  . Hyperlipidemia    HPI  Ankle Eversion:  Pt. Presents for evaluation of ankle turning outwards, present for 2 years, relatively unchanged although worse with certain shoes. As a result, he has trouble walking, no known injury (according to patient, he just 'woke up like this'). He is requesting an ankle brace to help him ambulate. He has mild pain in the right ankle, mainly brought on/exacerbated by walking.  Past Medical History  Diagnosis Date  . Hypertension   . Diabetes mellitus without complication (HCC)   . Seizures (HCC)   . Stroke (HCC)   . Hyperlipidemia   . Hydrocephalus in adult   . Depression     Past Surgical History  Procedure Laterality Date  . Loop recorder implant  12/20/13    MDT LinQ implanted by Dr Ladona Ridgel for cryptogenic stroke  . Loop recorder implant N/A 12/20/2013    Procedure: LOOP RECORDER IMPLANT;  Surgeon: Marinus Maw, MD;  Location: [facility] CATH LAB;  Service: Cardiovascular;  Laterality: N/A;    Family History  Problem Relation Age of Onset  . Stroke Father   . Heart disease Father   . Hypertension Father   . Hypertension Mother     Social History   Social History  . Marital Status: Single    Spouse Name: N/A  . Number of Children: N/A  . Years of Education: N/A   Occupational History  . Not on file.   Social History Main Topics  . Smoking status: Former Games developer  . Smokeless tobacco: Not on file  . Alcohol Use: 0.6 oz/week    1 Cans of beer per week  . Drug Use: No  . Sexual Activity: No   Other Topics Concern  . Not on file   Social History Narrative     Current outpatient prescriptions:  .  amLODipine (NORVASC) 10 MG tablet, Take 1 tablet (10 mg total) by mouth daily., Disp: 30 tablet, Rfl: 1 .  aspirin 81 MG chewable  tablet, Chew 1 tablet (81 mg total) by mouth daily., Disp: 30 tablet, Rfl: 1 .  atorvastatin (LIPITOR) 80 MG tablet, Take 1 tablet (80 mg total) by mouth daily., Disp: 30 tablet, Rfl: 1 .  Blood Glucose Monitoring Suppl (ACCU-CHEK AVIVA) device, Use as instructed, Disp: 1 each, Rfl: 0 .  carvedilol (COREG) 6.25 MG tablet, Take 1 tablet (6.25 mg total) by mouth 2 (two) times daily with a meal., Disp: 60 tablet, Rfl: 1 .  citalopram (CELEXA) 10 MG tablet, Take 1 tablet (10 mg total) by mouth daily., Disp: 30 tablet, Rfl: 1 .  clopidogrel (PLAVIX) 75 MG tablet, Take 1 tablet (75 mg total) by mouth daily with breakfast., Disp: 30 tablet, Rfl: 1 .  gabapentin (NEURONTIN) 300 MG capsule, Take 1 capsule (300 mg total) by mouth 3 (three) times daily., Disp: 90 capsule, Rfl: 1 .  GLIPIZIDE XL 10 MG 24 hr tablet, Take 1 tablet by mouth 2 (two) times daily., Disp: , Rfl:  .  glucose blood (ACCU-CHEK AVIVA) test strip, Use as instructed, Disp: 100 each, Rfl: 1 .  Lancet Devices (ACCU-CHEK SOFTCLIX) lancets, Use as instructed, Disp: 1 each, Rfl: 0 .  lisinopril (PRINIVIL,ZESTRIL) 5 MG tablet, Take 1  tablet (5 mg total) by mouth daily., Disp: 30 tablet, Rfl: 1 .  metFORMIN (GLUCOPHAGE) 1000 MG tablet, Take 1 tablet (1,000 mg total) by mouth 2 (two) times daily with a meal., Disp: 60 tablet, Rfl: 2 .  senna-docusate (SENOKOT-S) 8.6-50 MG per tablet, Take 2 tablets by mouth at bedtime. For constipation, Disp: 60 tablet, Rfl: 1  No Known Allergies   Review of Systems  Constitutional: Negative for fever and chills.  Musculoskeletal: Positive for joint pain.     Objective  Filed Vitals:   12/18/15 1352  BP: 120/78  Pulse: 80  Temp: 98.2 F (36.8 C)  TempSrc: Oral  Resp: 17  Height:  (1.854 m)  Weight: 297 lb 1.6 oz (134.764 kg)  SpO2: 98%    Physical Exam  Constitutional: He is well-developed, well-nourished, and in no distress.  Musculoskeletal:       Right ankle: He exhibits no swelling.  Tenderness.       Feet:  Tenderness to palpation over the right lateral malleolus, especially with the ankle in eversion. No swelling, ROM is restricted 2/2 pain.  Nursing note and vitals reviewed.     Assessment & Plan  1. Chronic pain of right ankle Will obtain x-ray of right ankle for further evaluation.  - DG Ankle Complete Right; Future   Dianna Ewald [name] 12/18/2015 2:01 PM

## 2015-12-20 ENCOUNTER — Ambulatory Visit
Admission: RE | Admit: 2015-12-20 | Discharge: 2015-12-20 | Disposition: A | Discharge status: Home / Self Care | Payer: Medicare Other | Source: Ambulatory Visit | Attending: Family Medicine | Admitting: Family Medicine

## 2015-12-20 ENCOUNTER — Other Ambulatory Visit: Payer: Self-pay | Admitting: Family Medicine

## 2015-12-20 DIAGNOSIS — G8929 Other chronic pain: Secondary | ICD-10-CM | POA: Diagnosis present

## 2015-12-20 DIAGNOSIS — M25571 Pain in right ankle and joints of right foot: Secondary | ICD-10-CM | POA: Diagnosis not present

## 2015-12-26 DIAGNOSIS — Q030 Malformations of aqueduct of Sylvius: Secondary | ICD-10-CM | POA: Insufficient documentation

## 2015-12-28 ENCOUNTER — Encounter: Payer: Self-pay | Admitting: Family Medicine

## 2015-12-28 ENCOUNTER — Ambulatory Visit: Payer: Medicare Other | Admitting: Family Medicine

## 2015-12-29 NOTE — Progress Notes (Signed)
This encounter was created in error - please disregard.

## 2016-04-04 ENCOUNTER — Encounter: Payer: Self-pay | Admitting: Emergency Medicine

## 2016-04-04 ENCOUNTER — Emergency Department
Admission: EM | Admit: 2016-04-04 | Discharge: 2016-04-04 | Disposition: A | Discharge status: Home / Self Care | Payer: Medicare Other | Attending: Emergency Medicine | Admitting: Emergency Medicine

## 2016-04-04 ENCOUNTER — Emergency Department: Payer: Medicare Other

## 2016-04-04 DIAGNOSIS — Z87891 Personal history of nicotine dependence: Secondary | ICD-10-CM | POA: Insufficient documentation

## 2016-04-04 DIAGNOSIS — Z79899 Other long term (current) drug therapy: Secondary | ICD-10-CM | POA: Insufficient documentation

## 2016-04-04 DIAGNOSIS — W1809XA Striking against other object with subsequent fall, initial encounter: Secondary | ICD-10-CM | POA: Insufficient documentation

## 2016-04-04 DIAGNOSIS — E119 Type 2 diabetes mellitus without complications: Secondary | ICD-10-CM | POA: Diagnosis not present

## 2016-04-04 DIAGNOSIS — S0003XA Contusion of scalp, initial encounter: Secondary | ICD-10-CM | POA: Diagnosis not present

## 2016-04-04 DIAGNOSIS — Z7982 Long term (current) use of aspirin: Secondary | ICD-10-CM | POA: Diagnosis not present

## 2016-04-04 DIAGNOSIS — Z7984 Long term (current) use of oral hypoglycemic drugs: Secondary | ICD-10-CM | POA: Diagnosis not present

## 2016-04-04 DIAGNOSIS — Y9289 Other specified places as the place of occurrence of the external cause: Secondary | ICD-10-CM | POA: Insufficient documentation

## 2016-04-04 DIAGNOSIS — W19XXXA Unspecified fall, initial encounter: Secondary | ICD-10-CM

## 2016-04-04 DIAGNOSIS — S0093XA Contusion of unspecified part of head, initial encounter: Secondary | ICD-10-CM

## 2016-04-04 DIAGNOSIS — Y999 Unspecified external cause status: Secondary | ICD-10-CM | POA: Insufficient documentation

## 2016-04-04 DIAGNOSIS — Y9389 Activity, other specified: Secondary | ICD-10-CM | POA: Diagnosis not present

## 2016-04-04 DIAGNOSIS — S0990XA Unspecified injury of head, initial encounter: Secondary | ICD-10-CM | POA: Diagnosis present

## 2016-04-04 DIAGNOSIS — S199XXA Unspecified injury of neck, initial encounter: Secondary | ICD-10-CM | POA: Diagnosis not present

## 2016-04-04 DIAGNOSIS — I10 Essential (primary) hypertension: Secondary | ICD-10-CM | POA: Insufficient documentation

## 2016-04-04 LAB — URINALYSIS COMPLETE WITH MICROSCOPIC (ARMC ONLY)
BILIRUBIN URINE: NEGATIVE
Bacteria, UA: NONE SEEN
KETONES UR: NEGATIVE mg/dL
LEUKOCYTES UA: NEGATIVE
NITRITE: NEGATIVE
Protein, ur: 100 mg/dL — AB
SPECIFIC GRAVITY, URINE: 1.012 (ref 1.005–1.030)
pH: 6 (ref 5.0–8.0)

## 2016-04-04 LAB — CBC
HCT: 44.4 % (ref 40.0–52.0)
HEMOGLOBIN: 15.1 g/dL (ref 13.0–18.0)
MCH: 30.8 pg (ref 26.0–34.0)
MCHC: 33.9 g/dL (ref 32.0–36.0)
MCV: 90.7 fL (ref 80.0–100.0)
Platelets: 204 10*3/uL (ref 150–440)
RBC: 4.89 MIL/uL (ref 4.40–5.90)
RDW: 14.2 % (ref 11.5–14.5)
WBC: 4.3 10*3/uL (ref 3.8–10.6)

## 2016-04-04 LAB — BASIC METABOLIC PANEL
ANION GAP: 9 (ref 5–15)
BUN: 12 mg/dL (ref 6–20)
CALCIUM: 9.2 mg/dL (ref 8.9–10.3)
CO2: 24 mmol/L (ref 22–32)
Chloride: 102 mmol/L (ref 101–111)
Creatinine, Ser: 1.17 mg/dL (ref 0.61–1.24)
GFR calc Af Amer: >60 mL/min (ref >60)
GLUCOSE: 216 mg/dL — AB (ref 65–99)
POTASSIUM: 4.1 mmol/L (ref 3.5–5.1)
SODIUM: 135 mmol/L (ref 135–145)

## 2016-04-04 LAB — TROPONIN I: Troponin I: <0.03 ng/mL (ref <0.03)

## 2016-04-04 LAB — GLUCOSE, CAPILLARY: Glucose-Capillary: 211 mg/dL — ABNORMAL HIGH (ref 65–99)

## 2016-04-04 MED ORDER — ACETAMINOPHEN 500 MG PO TABS
1000.0000 mg | ORAL_TABLET | Freq: Once | ORAL | Status: AC
Start: 2016-04-04 — End: 2016-04-04
  Administered 2016-04-04: 1000 mg via ORAL
  Filled 2016-04-04: qty 2

## 2016-04-04 NOTE — ED Provider Notes (Signed)
[facility] Emergency Department Provider Note  ____________________________________________  Time seen: Approximately 12:10 PM  I have reviewed the triage vital signs and the nursing notes.   HISTORY  Chief Complaint Fall   HPI [name] is a 45 y.o. male the history of chronic hydrocephalus, diabetes, hypertension, hyperlipidemia, seizure disorder, and prior stroke with expressive aphasia on aspirin and Plavix who presents for evaluation of head pain after a fall yesterday. Patient reports that he was coming down his porch and he is not sure how he fell but he denies dizziness, palpitations, CP, SOB leading to the fall. He thinks he might have missed a step. He remembers falling and hitting the ground. He hit his head onto the concrete and reports that he had loss of consciousness for a few seconds. He is on blood thinners as described above. Today his brother saw a bruise in his head and recommended that he come in for evaluation. Patient endorses mild soreness in his left head but denies headache, changes in vision, or any changes in his baseline neuro deficits. Patient is also complaining of mild neck pain mostly on the left posterior aspect has been present since the accident yesterday. Patient denies back pain, chest pain, abdominal pain, extremity pain. Patient denies dizziness, shortness of breath, palpitations prior or after the falling episode.  Past Medical History:  Diagnosis Date  . Depression   . Diabetes mellitus without complication (HCC)   . Hydrocephalus in adult   . Hyperlipidemia   . Hypertension   . Seizures (HCC)   . Stroke [facility](HCC)     Patient Active Problem List   Diagnosis Date Noted  . Erroneous encounter - disregard 12/29/2015  . Chronic pain of right ankle 12/18/2015  . Absent pedal pulses 10/16/2015  . Obesity, Class II, BMI 35-39.9, with comorbidity (HCC) 10/16/2015  . Hyperlipidemia 09/13/2015  . Chronic  brain-hydrocephalus syndrome 05/05/2015  . CVA (cerebral infarction) 12/17/2013  . HTN (hypertension) 12/17/2013  . Uncontrolled type 2 diabetes mellitus with peripheral neuropathy (HCC) 12/17/2013  . Cardiomyopathy (HCC) 12/17/2013    Past Surgical History:  Procedure Laterality Date  . LOOP RECORDER IMPLANT  12/20/13   MDT LinQ implanted by Dr Ladona Ridgelaylor for cryptogenic stroke  . LOOP RECORDER IMPLANT N/A 12/20/2013   Procedure: LOOP RECORDER IMPLANT;  Surgeon: Marinus MawGregg W Taylor, MD;  [facility];  Service: Cardiovascular;  Laterality: N/A;    Prior to Admission medications   Medication Sig Start Date End Date Taking? Authorizing Provider  amLODipine (NORVASC) 10 MG tablet Take 1 tablet (10 mg total) by mouth daily. 01/07/14  Yes Evlyn KannerPamela S Love, PA-C  aspirin 81 MG chewable tablet Chew 1 tablet (81 mg total) by mouth daily. 01/07/14  Yes Evlyn KannerPamela S Love, PA-C  atorvastatin (LIPITOR) 80 MG tablet Take 1 tablet (80 mg total) by mouth daily. 01/07/14  Yes Evlyn KannerPamela S Love, PA-C  Blood Glucose Monitoring Suppl (ACCU-CHEK AVIVA) device Use as instructed 10/27/15 10/26/16 Yes Ellyn HackSyed Asad A Shah, MD  carvedilol (COREG) 6.25 MG tablet Take 1 tablet (6.25 mg total) by mouth 2 (two) times daily with a meal. 01/07/14  Yes Evlyn KannerPamela S Love, PA-C  citalopram (CELEXA) 10 MG tablet Take 1 tablet (10 mg total) by mouth daily. 01/07/14  Yes Evlyn KannerPamela S Love, PA-C  clopidogrel (PLAVIX) 75 MG tablet Take 1 tablet (75 mg total) by mouth daily with breakfast. 01/07/14  Yes Evlyn KannerPamela S Love, PA-C  gabapentin (NEURONTIN) 300 MG capsule Take 1  capsule (300 mg total) by mouth 3 (three) times daily. 01/07/14  Yes Evlyn Kanner Love, PA-C  GLIPIZIDE XL 10 MG 24 hr tablet Take 1 tablet by mouth 2 (two) times daily. 03/30/15  Yes Historical Provider, MD  lisinopril (PRINIVIL,ZESTRIL) 5 MG tablet Take 1 tablet (5 mg total) by mouth daily. 01/08/14  Yes Evlyn Kanner Love, PA-C  metFORMIN (GLUCOPHAGE) 1000 MG tablet Take 1 tablet (1,000 mg total) by mouth 2  (two) times daily with a meal. 10/06/15  Yes Ellyn Hack, MD  senna-docusate (SENOKOT-S) 8.6-50 MG per tablet Take 2 tablets by mouth at bedtime. For constipation Patient taking differently: Take 2 tablets by mouth at bedtime as needed. For constipation 01/07/14  Yes Evlyn Kanner Love, PA-C  glucose blood (ACCU-CHEK AVIVA) test strip Use as instructed 10/27/15   Ellyn Hack, MD  Lancet Devices [facility]) lancets Use as instructed 10/27/15   Ellyn Hack, MD    Allergies Review of patient's allergies indicates no known allergies.  Family History  Problem Relation Age of Onset  . Stroke Father   . Heart disease Father   . Hypertension Father   . Hypertension Mother     Social History Social History  Substance Use Topics  . Smoking status: Former Games developer  . Smokeless tobacco: Not on file  . Alcohol use 0.6 oz/week    1 Cans of beer per week    Constitutional: Negative for fever. Eyes: Negative for visual changes. ENT: Negative for facial injury. + neck injury Cardiovascular: Negative for chest injury. Respiratory: Negative for shortness of breath. Negative for chest wall injury. Gastrointestinal: Negative for abdominal pain or injury. Genitourinary: Negative for dysuria. Musculoskeletal: Negative for back injury, negative for arm or leg pain. Skin: Negative for laceration/abrasions. Neurological: + head injury.   ____________________________________________   PHYSICAL EXAM:  VITAL SIGNS: ED Triage Vitals  Enc Vitals Group     BP 04/04/16 1135 (!) 141/105     Pulse Rate 04/04/16 1133 75     Resp 04/04/16 1133 20     Temp 04/04/16 1133 98.1 F (36.7 C)     Temp Source 04/04/16 1133 Oral     SpO2 04/04/16 1133 99 %     Weight 04/04/16 1135 290 lb (131.5 kg)     Height 04/04/16 1135 5\' 6"  (1.676 m)     Head Circumference --      Peak Flow --      Pain Score 04/04/16 1135 8     Pain Loc --      Pain Edu? --      Excl. in GC? --    Constitutional:  Alert and oriented. No acute distress. Does not appear intoxicated. HEENT Head: Normocephalic and scalp hematoma on the L parietal region with abrasion of the skin Face: No facial bony tenderness. Stable midface Ears: No hemotympanum bilaterally. No Battle sign Eyes: No eye injury. PERRL. No raccoon eyes Nose: Nontender. No epistaxis. No rhinorrhea Mouth/Throat: Mucous membranes are moist. No oropharyngeal blood. No dental injury. Airway patent without stridor. Normal voice. Neck: no C-collar in place. No midline c-spine tenderness. Left-sided paraspinal tenderness Cardiovascular: Normal rate, regular rhythm. Normal and symmetric distal pulses are present in all extremities. Pulmonary/Chest: Chest wall is stable and nontender to palpation/compression. Normal respiratory effort. Breath sounds are normal. No crepitus.  Abdominal: Soft, nontender, non distended. Musculoskeletal: Nontender with normal full range of motion in all extremities. No deformities. No thoracic or lumbar midline spinal  tenderness. Pelvis is stable. Skin: Skin is warm, dry and intact. No abrasions or contutions. Psychiatric: Speech and behavior are appropriate. Neurological: mild Expressive aphasia, unconjugated vision (both baseline). Moves all extremities to command, sensation and strength, no pronator drift, no dysmetria.   Glascow Coma Score: 4 - Opens eyes on own 6 - Follows simple motor commands 5 - Alert and oriented GCS: 15   ____________________________________________   LABS (all labs ordered are listed, but only abnormal results are displayed)  Labs Reviewed  BASIC METABOLIC PANEL - Abnormal; Notable for the following:       Result Value   Glucose, Bld 216 (*)    All other components within normal limits  URINALYSIS COMPLETEWITH MICROSCOPIC ([facility] ONLY) - Abnormal; Notable for the following:    Color, Urine YELLOW (*)    APPearance CLEAR (*)    Glucose, UA >500 (*)    Hgb urine dipstick 1+ (*)     Protein, ur 100 (*)    Squamous Epithelial / LPF 0-5 (*)    All other components within normal limits  GLUCOSE, CAPILLARY - Abnormal; Notable for the following:    Glucose-Capillary 211 (*)    All other components within normal limits  CBC  TROPONIN I  CBG MONITORING, ED   ____________________________________________  EKG  ED ECG REPORT I, Nita Sickle, the attending physician, personally viewed and interpreted this ECG.  Normal sinus rhythm, rate of 76, normal intervals, normal axis, no ST elevations or depressions. ____________________________________________  RADIOLOGY  Head CT and c-spine: Left-sided scalp soft tissue swelling, without acute intracranial Abnormality. 2. Chronic hydrocephalus with remote left basal ganglia lacunar Infarct. No acute osseous injury of the cervical spine. ____________________________________________   PROCEDURES  Procedure(s) performed: None Procedures Critical Care performed:  None ____________________________________________   INITIAL IMPRESSION / ASSESSMENT AND PLAN / ED COURSE  45 y.o. male the history of chronic hydrocephalus, diabetes, hypertension, hyperlipidemia, seizure disorder, and prior stroke with expressive aphasia on aspirin and Plavix who presents for evaluation of head pain after a fall yesterday. Patient with no new neuro deficits. He does have an obvious abrasion and scalp hematoma on the left parietal region. No signs or symptoms of basilar skull fracture. CT scan with no intracranial abnormalities. CT neck negative. EKG and lab work within normal limits. Patient remembers falling and therefore I believe this was most likely mechanical fall and not a syncopal episode. We'll monitor him on telemetry until final workup is done.  Clinical Course  Comment By Time  No acute injuries on CT head and neck. Labs within normal limits. The patient's pain is well-controlled by mouth Tylenol. We'll discharge home at this time.  Nita Sickle, MD 09/28 1556    Pertinent labs & imaging results that were available during my care of the patient were reviewed by me and considered in my medical decision making (see chart for details).    ____________________________________________   FINAL CLINICAL IMPRESSION(S) / ED DIAGNOSES  Final diagnoses:  Fall, initial encounter  Head contusion, initial encounter  Scalp hematoma, initial encounter      NEW MEDICATIONS STARTED DURING THIS VISIT:  New Prescriptions   No medications on file     Note:  This document was prepared using Dragon voice recognition software and may include unintentional dictation errors.    Nita Sickle, MD 04/04/16 1558

## 2016-04-04 NOTE — ED Notes (Signed)
[id number] documentation entered by Ambur Province T -

## 2016-04-04 NOTE — ED Notes (Signed)
Pt to CT scan.

## 2016-04-04 NOTE — Discharge Instructions (Signed)
Return to the emergency room if you have severe headache, dizziness, neck pain, back pain, weakness or numbness of your arms or legs, or any other symptoms concerning to you. Otherwise follow-up with her doctor in 1-2 days. You may take tylenol for pain.

## 2016-04-04 NOTE — ED Triage Notes (Addendum)
Pt fell yesterday. C/o right head pain. Does not know why he fell. Does not think he lost consciousness.  Has some dizziness now. Denies vision changes. Denies nausea or vomiting. Difficult for pt to get words out but reports this is normal. On plavix and ASA for CVA X 2 in past and has hydrocephalus hx.

## 2016-09-25 DIAGNOSIS — E1165 Type 2 diabetes mellitus with hyperglycemia: Secondary | ICD-10-CM | POA: Diagnosis not present

## 2016-09-25 DIAGNOSIS — Z1329 Encounter for screening for other suspected endocrine disorder: Secondary | ICD-10-CM | POA: Diagnosis not present

## 2016-09-25 DIAGNOSIS — I10 Essential (primary) hypertension: Secondary | ICD-10-CM | POA: Diagnosis not present

## 2016-11-19 DIAGNOSIS — I10 Essential (primary) hypertension: Secondary | ICD-10-CM | POA: Diagnosis not present

## 2017-09-15 DIAGNOSIS — E1140 Type 2 diabetes mellitus with diabetic neuropathy, unspecified: Secondary | ICD-10-CM | POA: Diagnosis not present

## 2017-09-15 DIAGNOSIS — E785 Hyperlipidemia, unspecified: Secondary | ICD-10-CM | POA: Diagnosis not present

## 2017-09-15 DIAGNOSIS — I10 Essential (primary) hypertension: Secondary | ICD-10-CM | POA: Diagnosis not present

## 2017-09-15 DIAGNOSIS — Z1329 Encounter for screening for other suspected endocrine disorder: Secondary | ICD-10-CM | POA: Diagnosis not present

## 2017-09-15 DIAGNOSIS — E1165 Type 2 diabetes mellitus with hyperglycemia: Secondary | ICD-10-CM | POA: Diagnosis not present

## 2018-08-12 ENCOUNTER — Other Ambulatory Visit: Payer: Self-pay

## 2018-08-12 ENCOUNTER — Encounter: Payer: Self-pay | Admitting: Nurse Practitioner

## 2018-08-12 ENCOUNTER — Ambulatory Visit (INDEPENDENT_AMBULATORY_CARE_PROVIDER_SITE_OTHER): Payer: Medicare Other | Admitting: Nurse Practitioner

## 2018-08-12 VITALS — BP 115/77 | HR 86 | Temp 98.6°F | Ht 72.0 in | Wt 281.0 lb

## 2018-08-12 DIAGNOSIS — I10 Essential (primary) hypertension: Secondary | ICD-10-CM | POA: Diagnosis not present

## 2018-08-12 DIAGNOSIS — G918 Other hydrocephalus: Secondary | ICD-10-CM

## 2018-08-12 DIAGNOSIS — Z8673 Personal history of transient ischemic attack (TIA), and cerebral infarction without residual deficits: Secondary | ICD-10-CM

## 2018-08-12 DIAGNOSIS — E1169 Type 2 diabetes mellitus with other specified complication: Secondary | ICD-10-CM

## 2018-08-12 DIAGNOSIS — E785 Hyperlipidemia, unspecified: Secondary | ICD-10-CM | POA: Diagnosis not present

## 2018-08-12 DIAGNOSIS — Z23 Encounter for immunization: Secondary | ICD-10-CM

## 2018-08-12 DIAGNOSIS — E1142 Type 2 diabetes mellitus with diabetic polyneuropathy: Secondary | ICD-10-CM

## 2018-08-12 DIAGNOSIS — Z6838 Body mass index (BMI) 38.0-38.9, adult: Secondary | ICD-10-CM

## 2018-08-12 DIAGNOSIS — E1165 Type 2 diabetes mellitus with hyperglycemia: Secondary | ICD-10-CM

## 2018-08-12 DIAGNOSIS — IMO0002 Reserved for concepts with insufficient information to code with codable children: Secondary | ICD-10-CM

## 2018-08-12 MED ORDER — METFORMIN HCL ER (OSM) 1000 MG PO TB24: 1000.0000 mg | ORAL_TABLET | Freq: Two times a day (BID) | ORAL | 5 refills | Status: DC

## 2018-08-12 MED ORDER — BLOOD GLUCOSE MONITOR KIT: PACK | 0 refills | Status: DC

## 2018-08-12 NOTE — Assessment & Plan Note (Signed)
Will refer to neurology at next visit, had previously seen [facility] but was lost to follow-up.  Would benefit from further monitoring by neuro d/t history.

## 2018-08-12 NOTE — Assessment & Plan Note (Signed)
Chronic, ongoing.  Change to XR Metformin d/t chronic diarrhea with regular.  Continue Glipizide.  A1C at physical.

## 2018-08-12 NOTE — Assessment & Plan Note (Signed)
Chronic, ongoing.  BP at goal today.  Continue current regimen.  Labs at next visit, physical.

## 2018-08-12 NOTE — Assessment & Plan Note (Signed)
Chronic, ongoing.  Continue statin.  Labs at next visit, physical.

## 2018-08-12 NOTE — Progress Notes (Signed)
New Patient Office Visit  Subjective:  Patient ID: [name], male    DOB: 01-22-1971  Age: 48 y.o. MRN: [id number]  CC:  Chief Complaint  Patient presents with  . Establish Care    HPI [name] presents for new patient visit to establish care.  Introduced to Designer, jewellery role and practice setting.  All questions answered.  Presents with his friend and caregiver/helper who assists with ROS and HPI.  CHRONIC BRAIN HYDROCEPHALUS SYNDROME: Previously followed by [name] Neurology and on last visit was 12/26/2015.  During that visit it was recommended a referral to neurosurgery be placed for evaluation for shunt and determine if group home setting would benefit patient.  He has h/o seizures and a previous stroke.  Has history of two strokes, last one was > 4 years ago.  Seizures started after CVA.  He has had three falls this year without injury, on the grass.  His caregiver is present and reports that his gait has changed, but feels it is due to his current diabetic shoes which are older and they obtaining new ones.  At baseline he also wears a brace to right ankle.  DIABETES Last A1C on record is from March 2017 = 6.5%.  His caregiver reports "they have not really checked labs at [name]".  Agrees to have labs at next visit.  Currently on Metformin 1000MG twice a day and Glipizide XL 10 MG two times daily.  Gabapentin for neuropathy discomfort.  Reports h/o diarrhea, continues to have issues with this.  The diarrhea has been present since he has been on Metformin.   Hypoglycemic episodes:no Polydipsia/polyuria: no Visual disturbance: yes Chest pain: no Paresthesias: yes, episodes twice a week Glucose Monitoring: no  Accucheck frequency: Not Checking  Fasting glucose:  Post prandial:  Evening:  Before meals: Taking Insulin?: no  Long acting insulin:  Short acting insulin: Blood Pressure Monitoring: not checking Retinal Examination: Not up to Date Foot  Exam: Not up to Date Diabetic Education: Not Completed Pneumovax: Up to Date Influenza: Up to Date Aspirin: yes   HYPERTENSION / HYPERLIPIDEMIA Currently takes Carvedilol, Norvasc, Lisinopril for HTN.  Atorvastatin, ASA, and Clopidogrel for cholesterol and h/o stroke.   Satisfied with current treatment? yes Duration of hypertension: chronic BP monitoring frequency: not checking BP range: N/A BP medication side effects: no Duration of hyperlipidemia: chronic Cholesterol medication side effects: no Cholesterol supplements: none Medication compliance: good compliance Aspirin: yes Recent stressors: no Recurrent headaches: no Visual changes: yes Palpitations: no Dyspnea: no Chest pain: no Lower extremity edema: no Dizzy/lightheaded: no   DEPRESSION Currently on Celexa daily for mood. Mood status: controlled Satisfied with current treatment?: yes Symptom severity: mild  Duration of current treatment : years Side effects: no Medication compliance: good compliance Psychotherapy/counseling: none Depressed mood: no Anxious mood: no Anhedonia: no Significant weight loss or gain: no Insomnia: none Fatigue: yes Feelings of worthlessness or guilt: no Impaired concentration/indecisiveness: no Suicidal ideations: no Hopelessness: no Crying spells: no Depression screen [name] 2/9 12/28/2015 12/18/2015 10/16/2015  Decreased Interest 0 0 0  Down, Depressed, Hopeless 0 0 0  PHQ - 2 Score 0 0 0    Past Medical History:  Diagnosis Date  . Depression   . Diabetes mellitus without complication ([place])   . Hydrocephalus in adult [facility])   . Hyperlipidemia   . Hypertension   . Seizures ([place])   . Stroke [name])     Past Surgical History:  Procedure Laterality Date  .  LOOP RECORDER IMPLANT  12/20/13   MDT LinQ implanted by Dr Lovena Le for cryptogenic stroke  . LOOP RECORDER IMPLANT N/A 12/20/2013   Procedure: LOOP RECORDER IMPLANT;  Surgeon: Evans Lance, MD;  Location: [facility] CATH LAB;   Service: Cardiovascular;  Laterality: N/A;    Family History  Problem Relation Age of Onset  . Stroke Father   . Heart disease Father   . Hypertension Father   . Hypertension Mother   . Diabetes Mother   . Arthritis Brother   . Diabetes Maternal Grandfather     Social History   Socioeconomic History  . Marital status: Single    Spouse name: Not on file  . Number of children: Not on file  . Years of education: Not on file  . Highest education level: Not on file  Occupational History  . Not on file  Social Needs  . Financial resource strain: Hard  . Food insecurity:    Worry: Never true    Inability: Never true  . Transportation needs:    Medical: No    Non-medical: No  Tobacco Use  . Smoking status: Former Research scientist (life sciences)  . Smokeless tobacco: Never Used  . Tobacco comment: > 4 years quit  Substance and Sexual Activity  . Alcohol use: Yes    Alcohol/week: 6.0 standard drinks    Types: 6 Cans of beer per week    Comment: in a week  . Drug use: No  . Sexual activity: Not Currently  Lifestyle  . Physical activity:    Days per week: 0 days    Minutes per session: 0 min  . Stress: Not at all  Relationships  . Social connections:    Talks on phone: More than three times a week    Gets together: More than three times a week    Attends religious service: More than 4 times per year    Active member of club or organization: Yes    Attends meetings of clubs or organizations: Never    Relationship status: Not on file  . Intimate partner violence:    Fear of current or ex partner: Not on file    Emotionally abused: Not on file    Physically abused: Not on file    Forced sexual activity: Not on file  Other Topics Concern  . Not on file  Social History Narrative  . Not on file    ROS Review of Systems  Constitutional: Negative for activity change, diaphoresis, fatigue and fever.  Respiratory: Negative for cough, chest tightness, shortness of breath and wheezing.     Cardiovascular: Negative for chest pain, palpitations and leg swelling.  Gastrointestinal: Positive for diarrhea (with metformin). Negative for abdominal distention, abdominal pain, constipation, nausea and vomiting.  Endocrine: Negative for cold intolerance, heat intolerance, polydipsia, polyphagia and polyuria.  Musculoskeletal: Positive for gait problem (occasional gait issue).  Skin: Negative.   Neurological: Negative for dizziness, seizures, syncope, weakness, light-headedness, numbness and headaches.  Psychiatric/Behavioral: Negative.     Objective:   Today's Vitals: BP 115/77   Pulse 86   Temp 98.6 F (37 C) (Oral)   Ht 6' (1.829 m)   Wt 281 lb (127.5 kg)   SpO2 96%   BMI 38.11 kg/m   Physical Exam Vitals signs and nursing note reviewed.  Constitutional:      General: He is awake.     Appearance: He is well-developed and overweight.     Comments: Pleasant African American male in W/C  HENT:     Head: Normocephalic and atraumatic.     Right Ear: Hearing normal. No drainage.     Left Ear: Hearing normal. No drainage.     Mouth/Throat:     Pharynx: Uvula midline.  Eyes:     General: Lids are normal.        Right eye: No discharge.        Left eye: No discharge.     Conjunctiva/sclera: Conjunctivae normal.     Pupils: Pupils are equal, round, and reactive to light.     Comments: Strabismus present  Neck:     Musculoskeletal: Normal range of motion and neck supple.     Thyroid: No thyromegaly.     Vascular: No carotid bruit or JVD.     Trachea: Trachea normal.  Cardiovascular:     Rate and Rhythm: Normal rate and regular rhythm.     Heart sounds: Normal heart sounds, S1 normal and S2 normal. No murmur. No gallop.   Pulmonary:     Effort: Pulmonary effort is normal.     Breath sounds: Normal breath sounds.  Abdominal:     General: Bowel sounds are normal.     Palpations: Abdomen is soft. There is no hepatomegaly or splenomegaly.  Musculoskeletal: Normal range  of motion.     Right lower leg: No edema.     Left lower leg: No edema.  Skin:    General: Skin is warm and dry.     Capillary Refill: Capillary refill takes less than 2 seconds.     Findings: No rash.  Neurological:     Mental Status: He is alert and oriented to person, place, and time.     Deep Tendon Reflexes: Reflexes are normal and symmetric.  Psychiatric:        Attention and Perception: Attention normal.        Mood and Affect: Affect is flat.        Behavior: Behavior normal. Behavior is cooperative.        Thought Content: Thought content normal.        Judgment: Judgment normal.     Comments: Slightly flat affect     Assessment & Plan:   Problem List Items Addressed This Visit      Cardiovascular and Mediastinum   HTN (hypertension)    Chronic, ongoing.  BP at goal today.  Continue current regimen.  Labs at next visit, physical.        Endocrine   Uncontrolled type 2 diabetes mellitus with peripheral neuropathy (HCC)    Chronic, ongoing.  Change to XR Metformin d/t chronic diarrhea with regular.  Continue Glipizide.  A1C at physical.      Relevant Medications   metformin (FORTAMET) 1000 MG (OSM) 24 hr tablet   Hyperlipidemia associated with type 2 diabetes mellitus (HCC)    Chronic, ongoing.  Continue statin.  Labs at next visit, physical.      Relevant Medications   metformin (FORTAMET) 1000 MG (OSM) 24 hr tablet     Nervous and Auditory   Chronic brain-hydrocephalus syndrome ([place])    Will refer to neurology at next visit, had previously seen [facility] but was lost to follow-up.  Would benefit from further monitoring by neuro d/t history.        Other   History of stroke    Refer to neurology at next visit.  Was previously followed by [facility], but was lost to follow-up.  Obesity    Recommend focus on diet and regular chair exercises at home.      Relevant Medications   metformin (FORTAMET) 1000 MG (OSM) 24 hr tablet    Other  Visit Diagnoses    Flu vaccine need    -  Primary   Relevant Orders   Flu Vaccine QUAD 36+ mos IM (Completed)      Outpatient Encounter Medications as of 08/12/2018  Medication Sig  . amLODipine (NORVASC) 10 MG tablet Take 1 tablet (10 mg total) by mouth daily.  Marland Kitchen aspirin 81 MG chewable tablet Chew 1 tablet (81 mg total) by mouth daily.  Marland Kitchen atorvastatin (LIPITOR) 80 MG tablet Take 1 tablet (80 mg total) by mouth daily.  . carvedilol (COREG) 6.25 MG tablet Take 1 tablet (6.25 mg total) by mouth 2 (two) times daily with a meal.  . citalopram (CELEXA) 10 MG tablet Take 1 tablet (10 mg total) by mouth daily.  . clopidogrel (PLAVIX) 75 MG tablet Take 1 tablet (75 mg total) by mouth daily with breakfast.  . gabapentin (NEURONTIN) 300 MG capsule Take 1 capsule (300 mg total) by mouth 3 (three) times daily.  Marland Kitchen GLIPIZIDE XL 10 MG 24 hr tablet Take 1 tablet by mouth 2 (two) times daily.  Marland Kitchen glucose blood (ACCU-CHEK AVIVA) test strip Use as instructed  . Lancet Devices (ACCU-CHEK SOFTCLIX) lancets Use as instructed  . lisinopril (PRINIVIL,ZESTRIL) 5 MG tablet Take 1 tablet (5 mg total) by mouth daily.  . [DISCONTINUED] metFORMIN (GLUCOPHAGE) 1000 MG tablet Take 1 tablet (1,000 mg total) by mouth 2 (two) times daily with a meal.  . blood glucose meter kit and supplies KIT Dispense based on patient and insurance preference. Use up to four times daily as directed. (FOR ICD-9 250.00, 250.01).  Marland Kitchen metformin (FORTAMET) 1000 MG (OSM) 24 hr tablet Take 1 tablet (1,000 mg total) by mouth 2 (two) times daily with a meal.  . [DISCONTINUED] senna-docusate (SENOKOT-S) 8.6-50 MG per tablet Take 2 tablets by mouth at bedtime. For constipation (Patient taking differently: Take 2 tablets by mouth at bedtime as needed. For constipation)   No facility-administered encounter medications on file as of 08/12/2018.     Follow-up: Return in about 2 weeks (around 08/26/2018) for Physical.   Venita Lick, NP

## 2018-08-12 NOTE — Patient Instructions (Signed)
Carbohydrate Counting for Diabetes Mellitus, Adult  Carbohydrate counting is a method of keeping track of how many carbohydrates you eat. Eating carbohydrates naturally increases the amount of sugar (glucose) in the blood. Counting how many carbohydrates you eat helps keep your blood glucose within normal limits, which helps you manage your diabetes (diabetes mellitus). It is important to know how many carbohydrates you can safely have in each meal. This is different for every person. A diet and nutrition specialist (registered dietitian) can help you make a meal plan and calculate how many carbohydrates you should have at each meal and snack. Carbohydrates are found in the following foods:  Grains, such as breads and cereals.  Dried beans and soy products.  Starchy vegetables, such as potatoes, peas, and corn.  Fruit and fruit juices.  Milk and yogurt.  Sweets and snack foods, such as cake, cookies, candy, chips, and soft drinks. How do I count carbohydrates? There are two ways to count carbohydrates in food. You can use either of the methods or a combination of both. Reading "Nutrition Facts" on packaged food The "Nutrition Facts" list is included on the labels of almost all packaged foods and beverages in the [place] It includes:  The serving size.  Information about nutrients in each serving, including the grams (g) of carbohydrate per serving. To use the "Nutrition Facts":  Decide how many servings you will have.  Multiply the number of servings by the number of carbohydrates per serving.  The resulting number is the total amount of carbohydrates that you will be having. Learning standard serving sizes of other foods When you eat carbohydrate foods that are not packaged or do not include "Nutrition Facts" on the label, you need to measure the servings in order to count the amount of carbohydrates:  Measure the foods that you will eat with a food scale or measuring cup, if needed.   Decide how many standard-size servings you will eat.  Multiply the number of servings by 15. Most carbohydrate-rich foods have about 15 g of carbohydrates per serving. ? For example, if you eat 8 oz (170 g) of strawberries, you will have eaten 2 servings and 30 g of carbohydrates (2 servings x 15 g = 30 g).  For foods that have more than one food mixed, such as soups and casseroles, you must count the carbohydrates in each food that is included. The following list contains standard serving sizes of common carbohydrate-rich foods. Each of these servings has about 15 g of carbohydrates:   hamburger bun or  English muffin.   oz (15 mL) syrup.   oz (14 g) jelly.  1 slice of bread.  1 six-inch tortilla.  3 oz (85 g) cooked rice or pasta.  4 oz (113 g) cooked dried beans.  4 oz (113 g) starchy vegetable, such as peas, corn, or potatoes.  4 oz (113 g) hot cereal.  4 oz (113 g) mashed potatoes or  of a large baked potato.  4 oz (113 g) canned or frozen fruit.  4 oz (120 mL) fruit juice.  4-6 crackers.  6 chicken nuggets.  6 oz (170 g) unsweetened dry cereal.  6 oz (170 g) plain fat-free yogurt or yogurt sweetened with artificial sweeteners.  8 oz (240 mL) milk.  8 oz (170 g) fresh fruit or one small piece of fruit.  24 oz (680 g) popped popcorn. Example of carbohydrate counting Sample meal  3 oz (85 g) chicken breast.  6 oz (170 g)   brown rice.  4 oz (113 g) corn.  8 oz (240 mL) milk.  8 oz (170 g) strawberries with sugar-free whipped topping. Carbohydrate calculation 1. Identify the foods that contain carbohydrates: ? Rice. ? Corn. ? Milk. ? Strawberries. 2. Calculate how many servings you have of each food: ? 2 servings rice. ? 1 serving corn. ? 1 serving milk. ? 1 serving strawberries. 3. Multiply each number of servings by 15 g: ? 2 servings rice x 15 g = 30 g. ? 1 serving corn x 15 g = 15 g. ? 1 serving milk x 15 g = 15 g. ? 1 serving  strawberries x 15 g = 15 g. 4. Add together all of the amounts to find the total grams of carbohydrates eaten: ? 30 g + 15 g + 15 g + 15 g = 75 g of carbohydrates total. Summary  Carbohydrate counting is a method of keeping track of how many carbohydrates you eat.  Eating carbohydrates naturally increases the amount of sugar (glucose) in the blood.  Counting how many carbohydrates you eat helps keep your blood glucose within normal limits, which helps you manage your diabetes.  A diet and nutrition specialist (registered dietitian) can help you make a meal plan and calculate how many carbohydrates you should have at each meal and snack. This information is not intended to replace advice given to you by your health care provider. Make sure you discuss any questions you have with your health care provider. Document Released: 06/24/2005 Document Revised: 01/01/2017 Document Reviewed: 12/06/2015 Elsevier Interactive Patient Education  2019 [employer]  

## 2018-08-12 NOTE — Assessment & Plan Note (Signed)
Refer to neurology at next visit.  Was previously followed by [facility], but was lost to follow-up.

## 2018-08-12 NOTE — Assessment & Plan Note (Signed)
Recommend focus on diet and regular chair exercises at home.

## 2018-08-27 ENCOUNTER — Emergency Department: Payer: Medicare Other

## 2018-08-27 ENCOUNTER — Encounter: Payer: Self-pay | Admitting: Emergency Medicine

## 2018-08-27 ENCOUNTER — Inpatient Hospital Stay
Admission: EM | Admit: 2018-08-27 | Discharge: 2018-08-28 | DRG: 639 | Disposition: A | Discharge status: Home / Self Care | Payer: Medicare Other | Attending: Internal Medicine | Admitting: Internal Medicine

## 2018-08-27 ENCOUNTER — Other Ambulatory Visit: Payer: Self-pay

## 2018-08-27 DIAGNOSIS — Z8261 Family history of arthritis: Secondary | ICD-10-CM

## 2018-08-27 DIAGNOSIS — Z833 Family history of diabetes mellitus: Secondary | ICD-10-CM | POA: Diagnosis not present

## 2018-08-27 DIAGNOSIS — R739 Hyperglycemia, unspecified: Secondary | ICD-10-CM | POA: Diagnosis not present

## 2018-08-27 DIAGNOSIS — Z8249 Family history of ischemic heart disease and other diseases of the circulatory system: Secondary | ICD-10-CM

## 2018-08-27 DIAGNOSIS — Z823 Family history of stroke: Secondary | ICD-10-CM | POA: Diagnosis not present

## 2018-08-27 DIAGNOSIS — Z7984 Long term (current) use of oral hypoglycemic drugs: Secondary | ICD-10-CM

## 2018-08-27 DIAGNOSIS — Z8673 Personal history of transient ischemic attack (TIA), and cerebral infarction without residual deficits: Secondary | ICD-10-CM | POA: Diagnosis not present

## 2018-08-27 DIAGNOSIS — I10 Essential (primary) hypertension: Secondary | ICD-10-CM | POA: Diagnosis present

## 2018-08-27 DIAGNOSIS — Z87891 Personal history of nicotine dependence: Secondary | ICD-10-CM

## 2018-08-27 DIAGNOSIS — R61 Generalized hyperhidrosis: Secondary | ICD-10-CM | POA: Diagnosis not present

## 2018-08-27 DIAGNOSIS — E162 Hypoglycemia, unspecified: Secondary | ICD-10-CM | POA: Diagnosis present

## 2018-08-27 DIAGNOSIS — G919 Hydrocephalus, unspecified: Secondary | ICD-10-CM | POA: Diagnosis present

## 2018-08-27 DIAGNOSIS — F329 Major depressive disorder, single episode, unspecified: Secondary | ICD-10-CM | POA: Diagnosis present

## 2018-08-27 DIAGNOSIS — Z7982 Long term (current) use of aspirin: Secondary | ICD-10-CM

## 2018-08-27 DIAGNOSIS — Z79899 Other long term (current) drug therapy: Secondary | ICD-10-CM

## 2018-08-27 DIAGNOSIS — E11649 Type 2 diabetes mellitus with hypoglycemia without coma: Principal | ICD-10-CM | POA: Diagnosis present

## 2018-08-27 DIAGNOSIS — E161 Other hypoglycemia: Secondary | ICD-10-CM | POA: Diagnosis not present

## 2018-08-27 DIAGNOSIS — E785 Hyperlipidemia, unspecified: Secondary | ICD-10-CM | POA: Diagnosis present

## 2018-08-27 DIAGNOSIS — Z7902 Long term (current) use of antithrombotics/antiplatelets: Secondary | ICD-10-CM | POA: Diagnosis not present

## 2018-08-27 DIAGNOSIS — R404 Transient alteration of awareness: Secondary | ICD-10-CM | POA: Diagnosis not present

## 2018-08-27 DIAGNOSIS — R569 Unspecified convulsions: Secondary | ICD-10-CM | POA: Diagnosis present

## 2018-08-27 LAB — URINALYSIS, COMPLETE (UACMP) WITH MICROSCOPIC
Bacteria, UA: NONE SEEN
Bilirubin Urine: NEGATIVE
GLUCOSE, UA: 50 mg/dL — AB
Ketones, ur: 5 mg/dL — AB
LEUKOCYTE UA: NEGATIVE
NITRITE: NEGATIVE
Protein, ur: NEGATIVE mg/dL
SPECIFIC GRAVITY, URINE: 1.014 (ref 1.005–1.030)
Squamous Epithelial / LPF: NONE SEEN (ref 0–5)
pH: 5 (ref 5.0–8.0)

## 2018-08-27 LAB — CBC
HCT: 44.4 % (ref 39.0–52.0)
HCT: 44.1 % (ref 39.0–52.0)
HEMOGLOBIN: 14.4 g/dL (ref 13.0–17.0)
Hemoglobin: 14.5 g/dL (ref 13.0–17.0)
MCH: 30.5 pg (ref 26.0–34.0)
MCH: 30.3 pg (ref 26.0–34.0)
MCHC: 32.7 g/dL (ref 30.0–36.0)
MCHC: 32.7 g/dL (ref 30.0–36.0)
MCV: 93.3 fL (ref 80.0–100.0)
MCV: 92.6 fL (ref 80.0–100.0)
NRBC: 0 % (ref 0.0–0.2)
Platelets: 251 K/uL (ref 150–400)
Platelets: 265 10*3/uL (ref 150–400)
RBC: 4.76 MIL/uL (ref 4.22–5.81)
RBC: 4.76 MIL/uL (ref 4.22–5.81)
RDW: 13.8 % (ref 11.5–15.5)
RDW: 13.7 % (ref 11.5–15.5)
WBC: 4 K/uL (ref 4.0–10.5)
WBC: 4.6 10*3/uL (ref 4.0–10.5)
nRBC: 0 % (ref 0.0–0.2)

## 2018-08-27 LAB — GLUCOSE, CAPILLARY
Glucose-Capillary: 110 mg/dL — ABNORMAL HIGH (ref 70–99)
Glucose-Capillary: 143 mg/dL — ABNORMAL HIGH (ref 70–99)
Glucose-Capillary: 214 mg/dL — ABNORMAL HIGH (ref 70–99)
Glucose-Capillary: 220 mg/dL — ABNORMAL HIGH (ref 70–99)
Glucose-Capillary: 222 mg/dL — ABNORMAL HIGH (ref 70–99)
Glucose-Capillary: 301 mg/dL — ABNORMAL HIGH (ref 70–99)
Glucose-Capillary: 40 mg/dL — CL (ref 70–99)

## 2018-08-27 LAB — COMPREHENSIVE METABOLIC PANEL WITH GFR
ALT: 16 U/L (ref 0–44)
AST: 22 U/L (ref 15–41)
Albumin: 4.3 g/dL (ref 3.5–5.0)
Alkaline Phosphatase: 54 U/L (ref 38–126)
Anion gap: 13 (ref 5–15)
BUN: 10 mg/dL (ref 6–20)
CO2: 21 mmol/L — ABNORMAL LOW (ref 22–32)
Calcium: 8.8 mg/dL — ABNORMAL LOW (ref 8.9–10.3)
Chloride: 101 mmol/L (ref 98–111)
Creatinine, Ser: 0.97 mg/dL (ref 0.61–1.24)
GFR calc Af Amer: >60 mL/min
GFR calc non Af Amer: >60 mL/min
Glucose, Bld: 129 mg/dL — ABNORMAL HIGH (ref 70–99)
Potassium: 4.7 mmol/L (ref 3.5–5.1)
Sodium: 135 mmol/L (ref 135–145)
Total Bilirubin: 1 mg/dL (ref 0.3–1.2)
Total Protein: 7.8 g/dL (ref 6.5–8.1)

## 2018-08-27 LAB — CREATININE, SERUM
Creatinine, Ser: 0.86 mg/dL (ref 0.61–1.24)
GFR calc non Af Amer: >60 mL/min (ref >60)

## 2018-08-27 LAB — HEMOGLOBIN A1C
Hgb A1c MFr Bld: 5.4 % (ref 4.8–5.6)
Mean Plasma Glucose: 108.28 mg/dL

## 2018-08-27 MED ORDER — HYDRALAZINE HCL 20 MG/ML IJ SOLN: 10.0000 mg | Freq: Four times a day (QID) | INTRAMUSCULAR | Status: DC | PRN

## 2018-08-27 MED ORDER — HYDROCODONE-ACETAMINOPHEN 5-325 MG PO TABS: 1.0000 | ORAL_TABLET | ORAL | Status: DC | PRN

## 2018-08-27 MED ORDER — DEXTROSE 50 % IV SOLN
1.0000 | Freq: Once | INTRAVENOUS | Status: AC
  Administered 2018-08-27: 50 mL via INTRAVENOUS

## 2018-08-27 MED ORDER — ACETAMINOPHEN 650 MG RE SUPP: 650.0000 mg | Freq: Four times a day (QID) | RECTAL | Status: DC | PRN

## 2018-08-27 MED ORDER — GABAPENTIN 300 MG PO CAPS
300.0000 mg | ORAL_CAPSULE | Freq: Three times a day (TID) | ORAL | Status: DC
  Administered 2018-08-27 – 2018-08-28 (×3): 300 mg via ORAL
  Filled 2018-08-27 (×3): qty 1

## 2018-08-27 MED ORDER — DEXTROSE-NACL 5-0.45 % IV SOLN
INTRAVENOUS | Status: DC
  Administered 2018-08-27: 11:00:00 via INTRAVENOUS

## 2018-08-27 MED ORDER — CARVEDILOL 3.125 MG PO TABS
6.2500 mg | ORAL_TABLET | Freq: Two times a day (BID) | ORAL | Status: DC
  Administered 2018-08-27 – 2018-08-28 (×2): 6.25 mg via ORAL
  Filled 2018-08-27 (×2): qty 2

## 2018-08-27 MED ORDER — AMLODIPINE BESYLATE 10 MG PO TABS
10.0000 mg | ORAL_TABLET | Freq: Every day | ORAL | Status: DC
  Administered 2018-08-27 – 2018-08-28 (×2): 10 mg via ORAL
  Filled 2018-08-27: qty 2
  Filled 2018-08-27: qty 1

## 2018-08-27 MED ORDER — ONDANSETRON HCL 4 MG PO TABS: 4.0000 mg | ORAL_TABLET | Freq: Four times a day (QID) | ORAL | Status: DC | PRN

## 2018-08-27 MED ORDER — INSULIN ASPART 100 UNIT/ML ~~LOC~~ SOLN
0.0000 [IU] | Freq: Three times a day (TID) | SUBCUTANEOUS | Status: DC
  Administered 2018-08-27 – 2018-08-28 (×2): 3 [IU] via SUBCUTANEOUS
  Administered 2018-08-28: 1 [IU] via SUBCUTANEOUS
  Filled 2018-08-27 (×3): qty 1

## 2018-08-27 MED ORDER — ENOXAPARIN SODIUM 40 MG/0.4ML ~~LOC~~ SOLN
40.0000 mg | SUBCUTANEOUS | Status: DC
  Administered 2018-08-27: 40 mg via SUBCUTANEOUS
  Filled 2018-08-27: qty 0.4

## 2018-08-27 MED ORDER — POLYETHYLENE GLYCOL 3350 17 G PO PACK: 17.0000 g | PACK | Freq: Every day | ORAL | Status: DC | PRN

## 2018-08-27 MED ORDER — ACETAMINOPHEN 325 MG PO TABS: 650.0000 mg | ORAL_TABLET | Freq: Four times a day (QID) | ORAL | Status: DC | PRN

## 2018-08-27 MED ORDER — CLOPIDOGREL BISULFATE 75 MG PO TABS
75.0000 mg | ORAL_TABLET | Freq: Every day | ORAL | Status: DC
  Administered 2018-08-28: 75 mg via ORAL
  Filled 2018-08-27: qty 1

## 2018-08-27 MED ORDER — LISINOPRIL 5 MG PO TABS
5.0000 mg | ORAL_TABLET | Freq: Every day | ORAL | Status: DC
  Administered 2018-08-27 – 2018-08-28 (×2): 5 mg via ORAL
  Filled 2018-08-27 (×2): qty 1

## 2018-08-27 MED ORDER — ASPIRIN 81 MG PO CHEW
81.0000 mg | CHEWABLE_TABLET | Freq: Every day | ORAL | Status: DC
  Administered 2018-08-27 – 2018-08-28 (×2): 81 mg via ORAL
  Filled 2018-08-27 (×2): qty 1

## 2018-08-27 MED ORDER — CITALOPRAM HYDROBROMIDE 20 MG PO TABS
10.0000 mg | ORAL_TABLET | Freq: Every day | ORAL | Status: DC
  Administered 2018-08-27 – 2018-08-28 (×2): 10 mg via ORAL
  Filled 2018-08-27 (×2): qty 1

## 2018-08-27 MED ORDER — ONDANSETRON HCL 4 MG/2ML IJ SOLN: 4.0000 mg | Freq: Four times a day (QID) | INTRAMUSCULAR | Status: DC | PRN

## 2018-08-27 NOTE — ED Notes (Signed)
Patient given [place] sandwich tray and gingerale. Per MD Cyril Loosen, ok to eat

## 2018-08-27 NOTE — H&P (Signed)
[place] [facility] [place] NAME: Glennie Rodda    MR#:  [id number]  DATE OF BIRTH:  19-Jun-1971  DATE OF ADMISSION:  08/27/2018  PRIMARY CARE PHYSICIAN: Venita Lick, NP   REQUESTING/REFERRING PHYSICIAN: Dr. Corky Downs  CHIEF COMPLAINT:   Low blood sugar HISTORY OF PRESENT ILLNESS:  Dimitrios Balestrieri  is a 48 y.o. male with a known history of chronic brain hydrocephalus syndrome, stroke, hypertension diabetes who presents today via EMS due to low blood sugar.  Patient reports that he felt that his blood sugar was low this morning he called EMS and his blood sugar was in the 40s. He received an amp of D50 in route repeat blood sugar was 176 however when he arrived in the emergency room it was 40.  He has been given another amp of D50 however remained persistently hypoglycemic and started on D5 drip.  Patient does report over the past several days he has had no appetite.  He continues to take his oral diabetic medications.  He has a history of stroke and as mentioned chronic brain hydrocephalus which is left him with a left-sided deficit.  He does have a caregiver. PAST MEDICAL HISTORY:   Past Medical History:  Diagnosis Date  . Depression   . Diabetes mellitus without complication ([place])   . Hydrocephalus in adult [facility])   . Hyperlipidemia   . Hypertension   . Seizures ([place])   . Stroke [facility])     PAST SURGICAL HISTORY:   Past Surgical History:  Procedure Laterality Date  . LOOP RECORDER IMPLANT  12/20/13   MDT LinQ implanted by Dr Lovena Le for cryptogenic stroke  . LOOP RECORDER IMPLANT N/A 12/20/2013   Procedure: LOOP RECORDER IMPLANT;  Surgeon: Evans Lance, MD;  Location: [facility] CATH LAB;  Service: Cardiovascular;  Laterality: N/A;    SOCIAL HISTORY:   Social History   Tobacco Use  . Smoking status: Former Research scientist (life sciences)  . Smokeless tobacco: Never Used  . Tobacco comment: > 4 years quit  Substance Use Topics  . Alcohol use: Yes    Alcohol/week: 6.0  standard drinks    Types: 6 Cans of beer per week    Comment: in a week    FAMILY HISTORY:   Family History  Problem Relation Age of Onset  . Stroke Father   . Heart disease Father   . Hypertension Father   . Hypertension Mother   . Diabetes Mother   . Arthritis Brother   . Diabetes Maternal Grandfather     DRUG ALLERGIES:  No Known Allergies  REVIEW OF SYSTEMS:   Review of Systems  Constitutional: Positive for malaise/fatigue. Negative for chills and fever.  HENT: Negative.  Negative for ear discharge, ear pain, hearing loss, nosebleeds and sore throat.        Left eye deviates to the left from previous stroke  Eyes: Negative.  Negative for blurred vision and pain.  Respiratory: Negative.  Negative for cough, hemoptysis, shortness of breath and wheezing.   Cardiovascular: Negative.  Negative for chest pain, palpitations and leg swelling.  Gastrointestinal: Negative.  Negative for abdominal pain, blood in stool, diarrhea, nausea and vomiting.  Genitourinary: Negative.  Negative for dysuria.  Musculoskeletal: Negative.  Negative for back pain.  Skin: Negative.   Neurological: Positive for speech change and focal weakness. Negative for dizziness, tremors, seizures and headaches.  Endo/Heme/Allergies: Negative.  Does not bruise/bleed easily.  Psychiatric/Behavioral: Negative.  Negative for depression, hallucinations and suicidal  ideas.    MEDICATIONS AT HOME:   Prior to Admission medications   Medication Sig Start Date End Date Taking? Authorizing Provider  amLODipine (NORVASC) 10 MG tablet Take 1 tablet (10 mg total) by mouth daily. 01/07/14  Yes Love, Ivan Anchors, PA-C  aspirin 81 MG chewable tablet Chew 1 tablet (81 mg total) by mouth daily. 01/07/14  Yes Love, Ivan Anchors, PA-C  blood glucose meter kit and supplies KIT Dispense based on patient and insurance preference. Use up to four times daily as directed. (FOR ICD-9 250.00, 250.01). 08/12/18  Yes Cannady, [name] T, NP    carvedilol (COREG) 6.25 MG tablet Take 1 tablet (6.25 mg total) by mouth 2 (two) times daily with a meal. 01/07/14  Yes Love, Pamela S, PA-C  citalopram (CELEXA) 10 MG tablet Take 1 tablet (10 mg total) by mouth daily. 01/07/14  Yes Love, Ivan Anchors, PA-C  clopidogrel (PLAVIX) 75 MG tablet Take 1 tablet (75 mg total) by mouth daily with breakfast. 01/07/14  Yes Love, Ivan Anchors, PA-C  gabapentin (NEURONTIN) 300 MG capsule Take 1 capsule (300 mg total) by mouth 3 (three) times daily. 01/07/14  Yes Love, Ivan Anchors, PA-C  GLIPIZIDE XL 10 MG 24 hr tablet Take 1 tablet by mouth 2 (two) times daily. 03/30/15  Yes [provider]  glucose blood (ACCU-CHEK AVIVA) test strip Use as instructed 10/27/15  Yes Roselee Nova, MD  [facility]) lancets Use as instructed 10/27/15  Yes Keith Rake Asad A, MD  lisinopril (PRINIVIL,ZESTRIL) 5 MG tablet Take 1 tablet (5 mg total) by mouth daily. 01/08/14  Yes Love, Ivan Anchors, PA-C  metformin (FORTAMET) 1000 MG (OSM) 24 hr tablet Take 1 tablet (1,000 mg total) by mouth 2 (two) times daily with a meal. 08/12/18  Yes Cannady, [name] T, NP      VITAL SIGNS:  Blood pressure (!) 165/110, pulse 85, temperature 97.7 F (36.5 C), temperature source Oral, resp. rate 15, height 6' 1"  (1.854 m), weight 104.3 kg, SpO2 99 %.  PHYSICAL EXAMINATION:   Physical Exam Constitutional:      General: He is not in acute distress. HENT:     Head: Normocephalic.  Eyes:     General: No scleral icterus.     Comments: strabismus  Neck:     Musculoskeletal: Normal range of motion and neck supple.     Vascular: No JVD.     Trachea: No tracheal deviation.  Cardiovascular:     Rate and Rhythm: Normal rate and regular rhythm.     Heart sounds: Normal heart sounds. No murmur. No friction rub. No gallop.   Pulmonary:     Effort: Pulmonary effort is normal. No respiratory distress.     Breath sounds: Normal breath sounds. No wheezing or rales.  Chest:     Chest wall:  No tenderness.  Abdominal:     General: Bowel sounds are normal. There is no distension.     Palpations: Abdomen is soft. There is no mass.     Tenderness: There is no abdominal tenderness. There is no guarding or rebound.  Musculoskeletal: Normal range of motion.     Right lower leg: Edema present.     Left lower leg: Edema present.  Skin:    General: Skin is warm.     Findings: No erythema or rash.  Neurological:     Mental Status: He is alert and oriented to person, place, and time. Mental status is at baseline.  Motor: Weakness present.  Psychiatric:        Judgment: Judgment normal.     Comments: Flat affect       LABORATORY PANEL:   CBC Recent Labs  Lab 08/27/18 1103  WBC 4.0  HGB 14.5  HCT 44.4  PLT 251   ------------------------------------------------------------------------------------------------------------------  Chemistries  Recent Labs  Lab 08/27/18 1103  NA 135  K 4.7  CL 101  CO2 21*  GLUCOSE 129*  BUN 10  CREATININE 0.97  CALCIUM 8.8*  AST 22  ALT 16  ALKPHOS 54  BILITOT 1.0   ------------------------------------------------------------------------------------------------------------------  Cardiac Enzymes No results for input(s): TROPONINI in the last 168 hours. ------------------------------------------------------------------------------------------------------------------  RADIOLOGY:  Dg Chest Port 1 View  Result Date: 08/27/2018 CLINICAL DATA:  Hypoglycemia with altered mental status EXAM: PORTABLE CHEST 1 VIEW COMPARISON:  Nov 27, 2014 FINDINGS: No edema or consolidation. Heart size and pulmonary vascularity are normal. No adenopathy. A loop recorder is noted on the left. No evident bone lesions. IMPRESSION: No edema or consolidation. Electronically Signed   By: Lowella Grip III M.D.   On: 08/27/2018 11:43    EKG:   Orders placed or performed during the hospital encounter of 04/04/16  . ED EKG  . ED EKG  . EKG     IMPRESSION AND PLAN:   48 year old male with history of diabetes, chronic hydrocephalus and previous strokes with left-sided deficit who presents the emergency room due to symptomatic hypoglycemia.  1.  Symptomatic hypoglycemia from poor p.o. intake and continue to take diabetic medications Continue D5 Monitor blood sugars every 2 hours for now Diabetes nurse consultation Hold diabetic medications for now.  2.  Essential hypertension: Continue lisinopril, Coreg and Norvasc  3.  Depression: Continue Celexa  4.  History of CVA with left residual deficit: Continue aspirin and Plavix    All the records are reviewed and case discussed with ED provider. Management plans discussed with the patient and he is  in agreement  CODE STATUS: full  TOTAL TIME TAKING CARE OF THIS PATIENT: 45 minutes.    Pranathi Winfree M.D on 08/27/2018 at 2:38 PM  Between 7am to 6pm - Pager - [phone]  After 6pm go to [email or web address] - password [email or web address] [facility]  Office  [phone]  CC: Primary care physician; Venita Lick, NP

## 2018-08-27 NOTE — ED Notes (Signed)
Blood sugar 40 upon arrival to ER

## 2018-08-27 NOTE — ED Provider Notes (Signed)
[facility] Emergency Department Provider Note   ____________________________________________    I have reviewed the triage vital signs and the nursing notes.   HISTORY  Chief Complaint Hypoglycemia     HPI [name] is a 48 y.o. male with a history of diabetes, CVA presents today with low blood sugar.  Patient reports he felt weak and when EMS got there they found his glucose to be in the low 40s.  They did give him an amp of D50 which improved his sugar to 115.  However by the time he got here his glucose had dropped into the 40s again.  Patient reports compliance with his medications, he does not take insulin but he does take a sulfonylurea and metformin.  Denies fevers or chills cough no dysuria   Past Medical History:  Diagnosis Date  . Depression   . Diabetes mellitus without complication ([place])   . Hydrocephalus in adult [facility])   . Hyperlipidemia   . Hypertension   . Seizures ([place])   . Stroke [facility])     Patient Active Problem List   Diagnosis Date Noted  . Hypoglycemia 08/27/2018  . Chronic pain of right ankle 12/18/2015  . Absent pedal pulses 10/16/2015  . Obesity 10/16/2015  . Hyperlipidemia associated with type 2 diabetes mellitus ([place]) 09/13/2015  . Chronic brain-hydrocephalus syndrome ([place]) 05/05/2015  . History of stroke 12/17/2013  . HTN (hypertension) 12/17/2013  . Uncontrolled type 2 diabetes mellitus with peripheral neuropathy ([place]) 12/17/2013  . Cardiomyopathy ([place]) 12/17/2013    Past Surgical History:  Procedure Laterality Date  . LOOP RECORDER IMPLANT  12/20/13   MDT LinQ implanted by Dr Lovena Le for cryptogenic stroke  . LOOP RECORDER IMPLANT N/A 12/20/2013   Procedure: LOOP RECORDER IMPLANT;  Surgeon: Evans Lance, MD;  Location: [facility] CATH LAB;  Service: Cardiovascular;  Laterality: N/A;    Prior to Admission medications   Medication Sig Start Date End Date Taking? Authorizing Provider  amLODipine (NORVASC) 10 MG  tablet Take 1 tablet (10 mg total) by mouth daily. 01/07/14  Yes Love, Ivan Anchors, PA-C  aspirin 81 MG chewable tablet Chew 1 tablet (81 mg total) by mouth daily. 01/07/14  Yes Love, Ivan Anchors, PA-C  blood glucose meter kit and supplies KIT Dispense based on patient and insurance preference. Use up to four times daily as directed. (FOR ICD-9 250.00, 250.01). 08/12/18  Yes Cannady, [name] T, NP  carvedilol (COREG) 6.25 MG tablet Take 1 tablet (6.25 mg total) by mouth 2 (two) times daily with a meal. 01/07/14  Yes Love, Pamela S, PA-C  citalopram (CELEXA) 10 MG tablet Take 1 tablet (10 mg total) by mouth daily. 01/07/14  Yes Love, Ivan Anchors, PA-C  clopidogrel (PLAVIX) 75 MG tablet Take 1 tablet (75 mg total) by mouth daily with breakfast. 01/07/14  Yes Love, Ivan Anchors, PA-C  gabapentin (NEURONTIN) 300 MG capsule Take 1 capsule (300 mg total) by mouth 3 (three) times daily. 01/07/14  Yes Love, Ivan Anchors, PA-C  GLIPIZIDE XL 10 MG 24 hr tablet Take 1 tablet by mouth 2 (two) times daily. 03/30/15  Yes [provider]  glucose blood (ACCU-CHEK AVIVA) test strip Use as instructed 10/27/15  Yes Roselee Nova, MD  [facility]) lancets Use as instructed 10/27/15  Yes Keith Rake Asad A, MD  lisinopril (PRINIVIL,ZESTRIL) 5 MG tablet Take 1 tablet (5 mg total) by mouth daily. 01/08/14  Yes Love, Ivan Anchors, PA-C  metformin (  FORTAMET) 1000 MG (OSM) 24 hr tablet Take 1 tablet (1,000 mg total) by mouth 2 (two) times daily with a meal. 08/12/18  Yes Cannady, Barbaraann Faster, NP     Allergies Patient has no known allergies.  Family History  Problem Relation Age of Onset  . Stroke Father   . Heart disease Father   . Hypertension Father   . Hypertension Mother   . Diabetes Mother   . Arthritis Brother   . Diabetes Maternal Grandfather     Social History Social History   Tobacco Use  . Smoking status: Former Research scientist (life sciences)  . Smokeless tobacco: Never Used  . Tobacco comment: > 4 years quit  Substance Use Topics   . Alcohol use: Yes    Alcohol/week: 6.0 standard drinks    Types: 6 Cans of beer per week    Comment: in a week  . Drug use: No    Review of Systems  Constitutional: No fever/chills Eyes: No visual changes.  ENT: No sore throat. Cardiovascular: Denies chest pain. Respiratory: Denies shortness of breath. Gastrointestinal: No abdominal pain.   Genitourinary: Negative for dysuria. Musculoskeletal: Negative for back pain. Skin: Negative for rash. Neurological: Negative for headaches or weakness   ____________________________________________   PHYSICAL EXAM:  VITAL SIGNS: ED Triage Vitals  Enc Vitals Group     BP 08/27/18 1040 (!) 158/105     Pulse Rate 08/27/18 1040 82     Resp 08/27/18 1040 18     Temp 08/27/18 1040 97.7 F (36.5 C)     Temp Source 08/27/18 1040 Oral     SpO2 08/27/18 1040 100 %     Weight 08/27/18 1040 104.3 kg (230 lb)     Height 08/27/18 1040 1.854 m (6' 1" )     Head Circumference --      Peak Flow --      Pain Score 08/27/18 1041 0     Pain Loc --      Pain Edu? --      Excl. in [place]? --     Constitutional: Alert and oriented.  Eyes: Conjunctivae are normal.   Nose: No congestion/rhinnorhea. Mouth/Throat: Mucous membranes are moist.    Cardiovascular: Normal rate, regular rhythm. Grossly normal heart sounds.  Good peripheral circulation. Respiratory: Normal respiratory effort.  No retractions. Lungs CTAB. Gastrointestinal: Soft and nontender. No distention.  No CVA tenderness.  Musculoskeletal: .  Warm and well perfused  Skin:  Skin is warm, dry and intact. No rash noted. Psychiatric: Mood and affect are normal. Speech and behavior are normal.  ____________________________________________   LABS (all labs ordered are listed, but only abnormal results are displayed)  Labs Reviewed  COMPREHENSIVE METABOLIC PANEL - Abnormal; Notable for the following components:      Result Value   CO2 21 (*)    Glucose, Bld 129 (*)    Calcium 8.8  (*)    All other components within normal limits  URINALYSIS, COMPLETE (UACMP) WITH MICROSCOPIC - Abnormal; Notable for the following components:   Color, Urine YELLOW (*)    APPearance HAZY (*)    Glucose, UA 50 (*)    Hgb urine dipstick SMALL (*)    Ketones, ur 5 (*)    All other components within normal limits  GLUCOSE, CAPILLARY - Abnormal; Notable for the following components:   Glucose-Capillary 40 (*)    All other components within normal limits  GLUCOSE, CAPILLARY - Abnormal; Notable for the following components:   Glucose-Capillary 110 (*)  All other components within normal limits  GLUCOSE, CAPILLARY - Abnormal; Notable for the following components:   Glucose-Capillary 143 (*)    All other components within normal limits  CBC  HIV ANTIBODY (ROUTINE TESTING W REFLEX)  CBC  CREATININE, SERUM  HEMOGLOBIN A1C  CBG MONITORING, ED   ____________________________________________  EKG None ____________________________________________  RADIOLOGY  Chest x-ray normal ____________________________________________   PROCEDURES  Procedure(s) performed: No  Procedures   Critical Care performed: yes  CRITICAL CARE Performed by: Lavonia Drafts   Total critical care time: 30 minutes  Critical care time was exclusive of separately billable procedures and treating other patients.  Critical care was necessary to treat or prevent imminent or life-threatening deterioration.  Critical care was time spent personally by me on the following activities: development of treatment plan with patient and/or surrogate as well as nursing, discussions with consultants, evaluation of patient's response to treatment, examination of patient, obtaining history from patient or surrogate, ordering and performing treatments and interventions, ordering and review of laboratory studies, ordering and review of radiographic studies, pulse oximetry and re-evaluation of patient's  condition.  ____________________________________________   INITIAL IMPRESSION / ASSESSMENT AND PLAN / ED COURSE  Pertinent labs & imaging results that were available during my care of the patient were reviewed by me and considered in my medical decision making (see chart for details).  Patient presents with hypoglycemia, brought in by EMS, after they gave him an amp of D50 he had normalized however upon arrival to the emergency department glucose dropped into the 40s again.  Additional amp of D50 given to the patient.  I ordered D5 half-normal infusion.  Lab work is overall reassuring, no evidence of infection or clear cause of hypoglycemia, will discuss with the hospitalist for admission    ____________________________________________   FINAL CLINICAL IMPRESSION(S) / ED DIAGNOSES  Final diagnoses:  Hypoglycemia        Note:  This document was prepared using Dragon voice recognition software and may include unintentional dictation errors.   Lavonia Drafts, MD 08/27/18 [phone]

## 2018-08-27 NOTE — ED Notes (Signed)
ED TO INPATIENT HANDOFF REPORT  Name/Age/Gender [name] 48 y.o. male  Code Status    Code Status Orders  (From admission, onward)         Start     Ordered   08/27/18 1350  Full code  Continuous     08/27/18 1350        Code Status History    Date Active Date Inactive Code Status Order ID Comments User Context   12/20/2013 1446 01/07/2014 2102 Full Code [id number]  Marinus Maw, MD Inpatient   12/17/2013 1516 12/20/2013 1446 Full Code [id number]  Jacquelynn Cree, PA-C Inpatient      Home/SNF/Other Home  Chief Complaint hypoglycemia  Level of Care/Admitting Diagnosis ED Disposition    ED Disposition Condition Comment   Admit  Hospital Area: [facility] [[id number]]  Level of Care: Med-Surg [16]  Diagnosis: Hypoglycemia [242204]  Admitting Physician: Adrian Saran [[id number]]  Attending Physician: MODY, Patricia Pesa [[id number]]  Estimated length of stay: past midnight tomorrow  Certification:: I certify this patient will need inpatient services for at least 2 midnights  PT Class (Do Not Modify): Inpatient [101]  PT Acc Code (Do Not Modify): Private [1]       Medical History Past Medical History:  Diagnosis Date  . Depression   . Diabetes mellitus without complication (HCC)   . Hydrocephalus in adult [facility])   . Hyperlipidemia   . Hypertension   . Seizures (HCC)   . Stroke [facility])     Allergies No Known Allergies  IV Location/Drains/Wounds Patient Lines/Drains/Airways Status   Active Line/Drains/Airways    Name:   Placement date:   Placement time:   Site:   Days:   Peripheral IV 05/05/15 Right Antecubital   05/05/15    0048    Antecubital   1210   Peripheral IV 08/27/18 Right Hand   08/27/18    1028    Hand   less than 1          Labs/Imaging Results for orders placed or performed during the hospital encounter of 08/27/18 (from the past 48 hour(s))  Glucose, capillary     Status: Abnormal   Collection Time: 08/27/18 10:41 AM  Result Value  Ref Range   Glucose-Capillary 40 (LL) 70 - 99 mg/dL  CBC     Status: None   Collection Time: 08/27/18 11:03 AM  Result Value Ref Range   WBC 4.0 4.0 - 10.5 K/uL   RBC 4.76 4.22 - 5.81 MIL/uL   Hemoglobin 14.5 13.0 - 17.0 g/dL   HCT 42.6 83.4 - 19.6 %   MCV 93.3 80.0 - 100.0 fL   MCH 30.5 26.0 - 34.0 pg   MCHC 32.7 30.0 - 36.0 g/dL   RDW 22.2 97.9 - 89.2 %   Platelets 251 150 - 400 K/uL   nRBC 0.0 0.0 - 0.2 %    Comment: Performed at [facility], [address] [place], [place] [address]  Comprehensive metabolic panel     Status: Abnormal   Collection Time: 08/27/18 11:03 AM  Result Value Ref Range   Sodium 135 135 - 145 mmol/L   Potassium 4.7 3.5 - 5.1 mmol/L    Comment: HEMOLYSIS AT THIS LEVEL MAY AFFECT RESULT   Chloride 101 98 - 111 mmol/L   CO2 21 (L) 22 - 32 mmol/L   Glucose, Bld 129 (H) 70 - 99 mg/dL   BUN 10 6 - 20 mg/dL   Creatinine, Ser 7.40  0.61 - 1.24 mg/dL   Calcium 8.8 (L) 8.9 - 10.3 mg/dL   Total Protein 7.8 6.5 - 8.1 g/dL   Albumin 4.3 3.5 - 5.0 g/dL   AST 22 15 - 41 U/L    Comment: HEMOLYSIS AT THIS LEVEL MAY AFFECT RESULT   ALT 16 0 - 44 U/L   Alkaline Phosphatase 54 38 - 126 U/L   Total Bilirubin 1.0 0.3 - 1.2 mg/dL    Comment: HEMOLYSIS AT THIS LEVEL MAY AFFECT RESULT   GFR calc non Af Amer >60 >60 mL/min   GFR calc Af Amer >60 >60 mL/min   Anion gap 13 5 - 15    Comment: Performed at [facility], [address] [place], [place] [address]  Urinalysis, Complete w Microscopic     Status: Abnormal   Collection Time: 08/27/18 11:03 AM  Result Value Ref Range   Color, Urine YELLOW (A) YELLOW   APPearance HAZY (A) CLEAR   Specific Gravity, Urine 1.014 1.005 - 1.030   pH 5.0 5.0 - 8.0   Glucose, UA 50 (A) NEGATIVE mg/dL   Hgb urine dipstick SMALL (A) NEGATIVE   Bilirubin Urine NEGATIVE NEGATIVE   Ketones, ur 5 (A) NEGATIVE mg/dL   Protein, ur NEGATIVE NEGATIVE mg/dL   Nitrite NEGATIVE NEGATIVE   Leukocytes,Ua NEGATIVE NEGATIVE    RBC / HPF 0-5 0 - 5 RBC/hpf   WBC, UA 0-5 0 - 5 WBC/hpf   Bacteria, UA NONE SEEN NONE SEEN   Squamous Epithelial / LPF NONE SEEN 0 - 5   Mucus PRESENT    Uric Acid Crys, UA PRESENT     Comment: Performed at [facility], [address] [place], [place] [address]  Glucose, capillary     Status: Abnormal   Collection Time: 08/27/18 11:58 AM  Result Value Ref Range   Glucose-Capillary 110 (H) 70 - 99 mg/dL   Comment 1 Document in Chart   Glucose, capillary     Status: Abnormal   Collection Time: 08/27/18  2:16 PM  Result Value Ref Range   Glucose-Capillary 143 (H) 70 - 99 mg/dL   Comment 1 Notify RN    Comment 2 Document in Chart   CBC     Status: None   Collection Time: 08/27/18  3:19 PM  Result Value Ref Range   WBC 4.6 4.0 - 10.5 K/uL   RBC 4.76 4.22 - 5.81 MIL/uL   Hemoglobin 14.4 13.0 - 17.0 g/dL   HCT 75.1 02.5 - 85.2 %   MCV 92.6 80.0 - 100.0 fL   MCH 30.3 26.0 - 34.0 pg   MCHC 32.7 30.0 - 36.0 g/dL   RDW 77.8 24.2 - 35.3 %   Platelets 265 150 - 400 K/uL   nRBC 0.0 0.0 - 0.2 %    Comment: Performed at [facility], [address] [place], [place] [address]  Creatinine, serum     Status: None   Collection Time: 08/27/18  3:19 PM  Result Value Ref Range   Creatinine, Ser 0.86 0.61 - 1.24 mg/dL   GFR calc non Af Amer >60 >60 mL/min   GFR calc Af Amer >60 >60 mL/min    Comment: Performed at [facility], [address] [place], [place] [address]   Dg Chest Port 1 View  Result Date: 08/27/2018 CLINICAL DATA:  Hypoglycemia with altered mental status EXAM: PORTABLE CHEST 1 VIEW COMPARISON:  Nov 27, 2014 FINDINGS: No edema or consolidation. Heart size and  pulmonary vascularity are normal. No adenopathy. A loop recorder is noted on the left. No evident bone lesions. IMPRESSION: No edema or consolidation. Electronically Signed   By: Bretta Bang III M.D.   On: 08/27/2018 11:43    Pending Labs Unresulted Labs (From admission, onward)     Start     Ordered   09/03/18 0500  Creatinine, serum  (enoxaparin (LOVENOX)    CrCl >/= 30 ml/min)  Weekly,   STAT    Comments:  while on enoxaparin therapy    08/27/18 1350   08/28/18 0500  Basic metabolic panel  Tomorrow morning,   STAT     08/27/18 1350   08/27/18 1704  Glucose, capillary  Once,   R     08/27/18 1704   08/27/18 1555  HIV Antibody (routine testing w rflx)  Once,   R     08/27/18 1555   08/27/18 1350  Hemoglobin A1c  Once,   STAT     08/27/18 1350          Vitals/Pain Today's Vitals   08/27/18 1400 08/27/18 1417 08/27/18 1418 08/27/18 1430  BP: (!) 165/110 (!) 165/110 (!) 165/110 (!) 158/97  Pulse: 85   85  Resp: 15   14  Temp:      TempSrc:      SpO2: 99%   98%  Weight:      Height:      PainSc:        Isolation Precautions No active isolations  Medications Medications  dextrose 5 %-0.45 % sodium chloride infusion ( Intravenous Transfusing/Transfer 08/27/18 1705)  amLODipine (NORVASC) tablet 10 mg (10 mg Oral Given 08/27/18 1418)  aspirin chewable tablet 81 mg (81 mg Oral Given 08/27/18 1418)  carvedilol (COREG) tablet 6.25 mg (has no administration in time range)  citalopram (CELEXA) tablet 10 mg (10 mg Oral Given 08/27/18 1417)  clopidogrel (PLAVIX) tablet 75 mg (has no administration in time range)  gabapentin (NEURONTIN) capsule 300 mg (300 mg Oral Given 08/27/18 1502)  lisinopril (PRINIVIL,ZESTRIL) tablet 5 mg (5 mg Oral Given 08/27/18 1417)  enoxaparin (LOVENOX) injection 40 mg (40 mg Subcutaneous Given 08/27/18 1421)  acetaminophen (TYLENOL) tablet 650 mg (has no administration in time range)    Or  acetaminophen (TYLENOL) suppository 650 mg (has no administration in time range)  HYDROcodone-acetaminophen (NORCO/VICODIN) 5-325 MG per tablet 1-2 tablet (has no administration in time range)  polyethylene glycol (MIRALAX / GLYCOLAX) packet 17 g (has no administration in time range)  ondansetron (ZOFRAN) tablet 4 mg (has no administration in time  range)    Or  ondansetron (ZOFRAN) injection 4 mg (has no administration in time range)  hydrALAZINE (APRESOLINE) injection 10 mg (has no administration in time range)  insulin aspart (novoLOG) injection 0-9 Units (has no administration in time range)  dextrose 50 % solution 50 mL (50 mLs Intravenous Given by Other 08/27/18 1030)    Mobility wheelchair, walker  power wheelchair

## 2018-08-27 NOTE — ED Triage Notes (Signed)
Pt ems from home for cbg 47, AMS. Pt states that he has hx DM takes metformin and has not eaten today. Ems reports 1/2 amp d50 given in route and repeat cbg 176. cbg here 40.

## 2018-08-27 NOTE — Progress Notes (Signed)
Inpatient Diabetes Program Recommendations  AACE/ADA: New Consensus Statement on Inpatient Glycemic Control  Target Ranges:  Prepandial:   less than 140 mg/dL      Peak postprandial:   less than 180 mg/dL (1-2 hours)      Critically ill patients:  140 - 180 mg/dL   Results for [name], [name] (MRN [id number]) as of 08/27/2018 14:08  Ref. Range 08/27/2018 10:41 08/27/2018 11:58  Glucose-Capillary Latest Ref Range: 70 - 99 mg/dL 40 (LL) 790 (H)  Results for [name], [name] (MRN [id number]) as of 08/27/2018 14:08  Ref. Range 08/27/2018 11:03  Glucose Latest Ref Range: 70 - 99 mg/dL 191 (H)  Results for [name], [name] (MRN [id number]) as of 08/27/2018 14:08  Ref. Range 09/27/2015 00:00  Hemoglobin A1C Unknown 6.5   Review of Glycemic Control  Diabetes history: DM2 Outpatient Diabetes medications: Glipizide XL 10 mg BID, Fortamet 1000 mg BID Current orders for Inpatient glycemic control: NONE  Inpatient Diabetes Program Recommendations:   CBG monitoring: Please consider ordering CBGs Q2H and if CBGs stable then change frequency to Q4H.  Insulin-Correction: Once glucose is consistently greater than 180 mg/dl and no further episodes of hypoglycemia, please consider ordering Novolog 0-9 units TID with meals.  Oral Agents: Noted patient seen J. Cannady, NP on 08/12/18 (initial visit for new patient) and per office note Glipizide XL 10 mg BID was continued and Metformin was changed to Fortamet 1000 mg BID due to diarrhea with regular Metformin.   HgbA1C: A1C currently ordered.  NOTE: Noted consult for Diabetes Coordinator. Chart reviewed. Patient is currently in Emergency Room with hypoglycemia. Noted patient was brought in by EMS and initial glucose was 47 mg/dl and EMS gave X77 prior to arrival at the hospital.  Initial glucose on arrival to the hospital was 40 mg/dl at 41:42 am today. Patient is currently ordered Regular diet and D5 1/2 NS @ 75 ml/hr which will need to be re-evaluated once hypoglycemia  is resolved.   Noted patient seen J. Harvest Dark, NP with [facility] on 08/12/18 (initial visit for new patient) and per office note Glipizide XL 10 mg BID was continued and Metformin was changed to Fortamet 1000 mg BID due to diarrhea with regular Metformin. Also noted that patient's caregiver reported that labs weren't regularly checked when patient was going to [place] and labs were going to be checked at next office visit. Also, patient was not checking glucose at home.  Will follow and await A1C results and Diabetes Coordinator will follow up with patient once results are available.   Thanks, Orlando Penner, RN, MSN, CDE Diabetes Coordinator Inpatient Diabetes Program [phone] (Team Pager from 8am to 5pm)

## 2018-08-28 DIAGNOSIS — E11649 Type 2 diabetes mellitus with hypoglycemia without coma: Secondary | ICD-10-CM | POA: Diagnosis not present

## 2018-08-28 DIAGNOSIS — E162 Hypoglycemia, unspecified: Secondary | ICD-10-CM | POA: Diagnosis not present

## 2018-08-28 DIAGNOSIS — Z8673 Personal history of transient ischemic attack (TIA), and cerebral infarction without residual deficits: Secondary | ICD-10-CM | POA: Diagnosis not present

## 2018-08-28 LAB — GLUCOSE, CAPILLARY
Glucose-Capillary: 177 mg/dL — ABNORMAL HIGH (ref 70–99)
Glucose-Capillary: 131 mg/dL — ABNORMAL HIGH (ref 70–99)
Glucose-Capillary: 207 mg/dL — ABNORMAL HIGH (ref 70–99)

## 2018-08-28 LAB — BASIC METABOLIC PANEL
Anion gap: 7 (ref 5–15)
BUN: 11 mg/dL (ref 6–20)
CO2: 26 mmol/L (ref 22–32)
Calcium: 8.3 mg/dL — ABNORMAL LOW (ref 8.9–10.3)
Chloride: 104 mmol/L (ref 98–111)
Creatinine, Ser: 1.04 mg/dL (ref 0.61–1.24)
GFR calc non Af Amer: >60 mL/min (ref >60)
Glucose, Bld: 165 mg/dL — ABNORMAL HIGH (ref 70–99)
Potassium: 3.6 mmol/L (ref 3.5–5.1)
Sodium: 137 mmol/L (ref 135–145)

## 2018-08-28 LAB — HIV ANTIBODY (ROUTINE TESTING W REFLEX): HIV SCREEN 4TH GENERATION: NONREACTIVE

## 2018-08-28 MED ORDER — GLIPIZIDE XL 10 MG PO TB24: 10.0000 mg | ORAL_TABLET | Freq: Every day | ORAL | 0 refills | Status: DC

## 2018-08-28 MED ORDER — METFORMIN HCL ER (OSM) 1000 MG PO TB24: 1000.0000 mg | ORAL_TABLET | Freq: Every day | ORAL | 5 refills | Status: DC

## 2018-08-28 NOTE — Discharge Summary (Signed)
[place] [facility] [place] NAME: [name]    MR#:  [id number]  DATE OF BIRTH:  Oct 18, 1970  DATE OF ADMISSION:  08/27/2018   ADMITTING PHYSICIAN: Bettey Costa, MD  DATE OF DISCHARGE: 08/28/2018  PRIMARY CARE PHYSICIAN: Venita Lick, NP   ADMISSION DIAGNOSIS:  Hypoglycemia [E16.2] DISCHARGE DIAGNOSIS:  Active Problems:   Hypoglycemia  SECONDARY DIAGNOSIS:   Past Medical History:  Diagnosis Date  . Depression   . Diabetes mellitus without complication ([place])   . Hydrocephalus in adult [facility])   . Hyperlipidemia   . Hypertension   . Seizures ([place])   . Stroke [facility])    HOSPITAL COURSE:   [name]  is a 48 y.o. male with a known history of chronic brain hydrocephalus syndrome, stroke, hypertension diabetes who presented via EMS due to low blood sugar.  Patient reports that he felt that his blood sugar was low this morning he called EMS and his blood sugar was in the 40s. He received an amp of D50 in route repeat blood sugar was 176 however when he arrived in the emergency room it was 40.  He has been given another amp of D50 however remained persistently hypoglycemic and started on D5 drip.  Patient does report over the past several days he has had no appetite.  He continues to take his oral diabetic medications despite low PO intake. He has a history of stroke and as mentioned chronic brain hydrocephalus which is left him with a left-sided deficit.  He does have a caregiver.  1.  Symptomatic hypoglycemia from poor p.o. intake and continue to take diabetic medications Continued D5 until sugars elevated and stable Monitored blood sugars every 2 hours. Diabetes nurse consultation while admitted. Held diabetic medications initially. - Changes to avoid symptomatic hypoglycemia: Take glipizide once daily rather than twice daily. Take metformin once daily rather than twice daily, with breakfast. Follow-up with PCP for close monitoring of  diabetes  2.  Essential hypertension: Continue lisinopril, Coreg and Norvasc  3.  Depression: Continue Celexa  4.  History of CVA with left residual deficit: Continue aspirin and Plavix   DISCHARGE CONDITIONS:  stable CONSULTS OBTAINED:  DM coordinator DRUG ALLERGIES:  No Known Allergies DISCHARGE MEDICATIONS:   Allergies as of 08/28/2018   No Known Allergies     Medication List    TAKE these medications   accu-chek softclix lancets Use as instructed   amLODipine 10 MG tablet Commonly known as:  NORVASC Take 1 tablet (10 mg total) by mouth daily.   aspirin 81 MG chewable tablet Chew 1 tablet (81 mg total) by mouth daily.   blood glucose meter kit and supplies Kit Dispense based on patient and insurance preference. Use up to four times daily as directed. (FOR ICD-9 250.00, 250.01).   carvedilol 6.25 MG tablet Commonly known as:  COREG Take 1 tablet (6.25 mg total) by mouth 2 (two) times daily with a meal.   citalopram 10 MG tablet Commonly known as:  CELEXA Take 1 tablet (10 mg total) by mouth daily.   clopidogrel 75 MG tablet Commonly known as:  PLAVIX Take 1 tablet (75 mg total) by mouth daily with breakfast.   gabapentin 300 MG capsule Commonly known as:  NEURONTIN Take 1 capsule (300 mg total) by mouth 3 (three) times daily.   GLIPIZIDE XL 10 MG 24 hr tablet Generic drug:  glipiZIDE Take 1 tablet (10 mg total) by mouth daily. What changed:  how much to take  when to take this   glucose blood test strip Commonly known as:  ACCU-CHEK AVIVA Use as instructed   lisinopril 5 MG tablet Commonly known as:  PRINIVIL,ZESTRIL Take 1 tablet (5 mg total) by mouth daily.   metformin 1000 MG (OSM) 24 hr tablet Commonly known as:  FORTAMET Take 1 tablet (1,000 mg total) by mouth daily with breakfast. What changed:  when to take this        DISCHARGE INSTRUCTIONS:   DIET:  Diabetic diet DISCHARGE CONDITION:  Stable ACTIVITY:  Activity as  tolerated OXYGEN:  Home Oxygen: No.  Oxygen Delivery: room air DISCHARGE LOCATION:  home   If you experience worsening of your admission symptoms, develop shortness of breath, life threatening emergency, suicidal or homicidal thoughts you must seek medical attention immediately by calling 911 or calling your MD immediately if your symptoms are severe.  You Must read complete instructions/literature along with all the possible adverse reactions/side effects for all the medicines you take and that have been prescribed to you. Take any new medicines only after you have completely understood and accept all the possible adverse reactions/side effects.   Please note  You were cared for by a hospitalist during your hospital stay. If you have any questions about your discharge medications or the care you received while you were in the hospital after you are discharged, you can call the unit and asked to speak with the hospitalist on call if the hospitalist that took care of you is not available. Once you are discharged, your primary care physician will handle any further medical issues. Please note that NO REFILLS for any discharge medications will be authorized once you are discharged, as it is imperative that you return to your primary care physician (or establish a relationship with a primary care physician if you do not have one) for your aftercare needs so that they can reassess your need for medications and monitor your lab values.   On the day of Discharge:  VITAL SIGNS:  Blood pressure 133/89, pulse 71, temperature 98.8 F (37.1 C), resp. rate 16, height _0  (1.854 m), weight 104.3 kg, SpO2 99 %. PHYSICAL EXAMINATION:  GENERAL:  48 y.o.-year-old patient lying in the bed with no acute distress.  EYES: Pupils equal, round, reactive to light and accommodation. Strabismus left eye. No scleral icterus.  HEENT: Head atraumatic, normocephalic. Oropharynx and nasopharynx clear.   NECK:  Supple, no  jugular venous distention. No thyroid enlargement, no tenderness.  LUNGS: Normal breath sounds bilaterally, no wheezing, rales,rhonchi or crepitation. No use of accessory muscles of respiration.  CARDIOVASCULAR: S1, S2 normal. No murmurs, rubs, or gallops.  ABDOMEN: Soft, non-tender, non-distended. Bowel sounds present. No organomegaly or mass.  EXTREMITIES: No pedal edema, cyanosis, or clubbing.  NEUROLOGIC: Cranial nerves II through XII are intact. Muscle strength 5/5 R extremities. 4/5 left extremities. Sensation intact. Gait not checked.  PSYCHIATRIC: The patient is alert and oriented x 3. Affect is flat. SKIN: No obvious rash, lesion, or ulcer.  DATA REVIEW:   CBC Recent Labs  Lab 08/27/18 1519  WBC 4.6  HGB 14.4  HCT 44.1  PLT 265    Chemistries  Recent Labs  Lab 08/27/18 1103  08/28/18 0530  NA 135  --  137  K 4.7  --  3.6  CL 101  --  104  CO2 21*  --  26  GLUCOSE 129*  --  165*  BUN 10  --  11  CREATININE 0.97   < > 1.04  CALCIUM 8.8*  --  8.3*  AST 22  --   --   ALT 16  --   --   ALKPHOS 54  --   --   BILITOT 1.0  --   --    < > = values in this interval not displayed.    RADIOLOGY:  No results found.   Management plans discussed with the patient and/or family and they are in agreement.  CODE STATUS: Full Code   TOTAL TIME TAKING CARE OF THIS PATIENT: 45 minutes.    Patrick Antonucci PA-C on 08/28/2018 at 2:08 PM  Between 7am to 6pm - Pager - [phone]  After 6pm go to [email or web address] - [employer] [facility]  Office  [phone]  CC: Primary care physician; Venita Lick, NP

## 2018-08-28 NOTE — Progress Notes (Addendum)
Inpatient Diabetes Program Recommendations  AACE/ADA: New Consensus Statement on Inpatient Glycemic Control (2015)  Target Ranges:  Prepandial:   less than 140 mg/dL      Peak postprandial:   less than 180 mg/dL (1-2 hours)      Critically ill patients:  140 - 180 mg/dL   Results for ANTOINNE, [name] (MRN [id number]) as of 08/28/2018 08:34  Ref. Range 08/27/2018 10:41 08/27/2018 11:58 08/27/2018 14:16 08/27/2018 17:04 08/27/2018 18:42 08/27/2018 21:12  Glucose-Capillary Latest Ref Range: 70 - 99 mg/dL 40 (LL) 110 (H) 143 (H) 220 (H)  3 units NOVOLOG    301 (H) 214 (H)   Results for Patrick, [name] (MRN [id number]) as of 08/28/2018 08:34  Ref. Range 08/27/2018 23:50 08/28/2018 01:26 08/28/2018 07:49  Glucose-Capillary Latest Ref Range: 70 - 99 mg/dL 222 (H) 177 (H) 131 (H)  1 unit NOVOLOG    Results for Patrick, [name] (MRN [id number]) as of 08/28/2018 08:34  Ref. Range 08/27/2018 15:19  Hemoglobin A1C Latest Ref Range: 4.8 - 5.6 % 5.4  (108 mg/dl)    Home DM Meds: Glipizide XL 10 mg BID        Fortamet 1000 mg BID  Current Orders: Novolog Sensitive Correction Scale/ SSI (0-9 units) TID AC     MD- Note that Current A1c= 5.4%.  Since patient admitted with Severe Hypoglycemia, may want to consider reduction of Glipizide for home use.  Patient will need to follow up with PCP soon after d/c for further DM medication management.    Addendum 11:45am- Met with pt today to discuss HYPO on admission.  Pt told me he doesn't know what happened.  Discussed with pt that the MD in the hospital plans to d/c him home on reduced doses of Glipizide and Fortamet.  Encouraged pt to check his CBGs at least BID at home if not more frequently (at least QAM before Breakfast and vary the 2nd check of the day either before a meal or two hours after a meal).  Also encouraged pt to check CBGs immediately if HYPO suspected.  Pt stated he does have a CBG meter at home.  Pt asked me to call his friend [name] to discuss  this info.  Called [place] and relayed the same info to her as well.  [name] thanked me for calling her and alerting her to this info.  [name] stated she checks pt's CBGs and will make sure he follows up with PCP.      Noted patient saw J. Cannady, NP on 08/12/18 (initial visit for new patient) and per office note Glipizide XL 10 mg BID was continued and Metformin was changed to Fortamet 1000 mg BID due to diarrhea with regular Metformin.   Also noted that patient's caregiver reported that labs weren't regularly checked when patient was going to [name] and labs were going to be checked at next office visit. Patient was NOT checking glucose at home.      --Will follow patient during hospitalization--  Wyn Quaker RN, MSN, CDE Diabetes Coordinator Inpatient Glycemic Control Team Team Pager: [phone] (8a-5p)

## 2018-08-28 NOTE — Discharge Instructions (Signed)
Hypoglycemia Hypoglycemia is when the sugar (glucose) level in your blood is too low. Signs of low blood sugar may include:  Feeling: ? Hungry. ? Worried or nervous (anxious). ? Sweaty and clammy. ? Confused. ? Dizzy. ? Sleepy. ? Sick to your stomach (nauseous).  Having: ? A fast heartbeat. ? A headache. ? A change in your vision. ? Tingling or no feeling (numbness) around your mouth, lips, or tongue. ? Jerky movements that you cannot control (seizure).  Having trouble with: ? Moving (coordination). ? Sleeping. ? Passing out (fainting). ? Getting upset easily (irritability). Low blood sugar can happen to people who have diabetes and people who do not have diabetes. Low blood sugar can happen quickly, and it can be an emergency. Treating low blood sugar Low blood sugar is often treated by eating or drinking something sugary right away, such as:  Fruit juice, 4-6 oz (120-150 mL).  Regular soda (not diet soda), 4-6 oz (120-150 mL).  Low-fat milk, 4 oz (120 mL).  Several pieces of hard candy.  Sugar or honey, 1 Tbsp (15 mL). Treating low blood sugar if you have diabetes If you can think clearly and swallow safely, follow the 15:15 rule:  Take 15 grams of a fast-acting carb (carbohydrate). Talk with your doctor about how much you should take.  Always keep a source of fast-acting carb with you, such as: ? Sugar tablets (glucose pills). Take 3-4 pills. ? 6-8 pieces of hard candy. ? 4-6 oz (120-150 mL) of fruit juice. ? 4-6 oz (120-150 mL) of regular (not diet) soda. ? 1 Tbsp (15 mL) honey or sugar.  Check your blood sugar 15 minutes after you take the carb.  If your blood sugar is still at or below 70 mg/dL (3.9 mmol/L), take 15 grams of a carb again.  If your blood sugar does not go above 70 mg/dL (3.9 mmol/L) after 3 tries, get help right away.  After your blood sugar goes back to normal, eat a meal or a snack within 1 hour.  Treating very low blood sugar If  your blood sugar is at or below 54 mg/dL (3 mmol/L), you have very low blood sugar (severe hypoglycemia). This may also cause:  Passing out.  Jerky movements you cannot control (seizure).  Losing consciousness (coma). This is an emergency. Do not wait to see if the symptoms will go away. Get medical help right away. Call your local emergency services (911 in the [place] Do not drive yourself to the hospital. If you have very low blood sugar and you cannot eat or drink, you may need a glucagon shot (injection). A family member or friend should learn how to check your blood sugar and how to give you a glucagon shot. Ask your doctor if you need to have a glucagon shot kit at home. Follow these instructions at home: General instructions  Take over-the-counter and prescription medicines only as told by your doctor.  Stay aware of your blood sugar as told by your doctor.  Limit alcohol intake to no more than 1 drink a day for nonpregnant women and 2 drinks a day for men. One drink equals 12 oz of beer (355 mL), 5 oz of wine (148 mL), or 1 oz of hard liquor (44 mL).  Keep all follow-up visits as told by your doctor. This is important. If you have diabetes:   Follow your diabetes care plan as told by your doctor. Make sure you: ? Know the signs of low blood  sugar. ? Take your medicines as told. ? Follow your exercise and meal plan. ? Eat on time. Do not skip meals. ? Check your blood sugar as often as told by your doctor. Always check it before and after exercise. ? Follow your sick day plan when you cannot eat or drink normally. Make this plan ahead of time with your doctor.  Share your diabetes care plan with: ? Your work or school. ? People you live with.  Check your pee (urine) for ketones: ? When you are sick. ? As told by your doctor.  Carry a card or wear jewelry that says you have diabetes. Contact a doctor if:  You have trouble keeping your blood sugar in your target  range.  You have low blood sugar often. Get help right away if:  You still have symptoms after you eat or drink something sugary.  Your blood sugar is at or below 54 mg/dL (3 mmol/L).  You have jerky movements that you cannot control.  You pass out. These symptoms may be an emergency. Do not wait to see if the symptoms will go away. Get medical help right away. Call your local emergency services (911 in the [place] Do not drive yourself to the hospital. Summary  Hypoglycemia happens when the level of sugar (glucose) in your blood is too low.  Low blood sugar can happen to people who have diabetes and people who do not have diabetes. Low blood sugar can happen quickly, and it can be an emergency.  Make sure you know the signs of low blood sugar and know how to treat it.  Always keep a source of sugar (fast-acting carb) with you to treat low blood sugar. This information is not intended to replace advice given to you by your health care provider. Make sure you discuss any questions you have with your health care provider.    Symptoms of Hypoglycemia: Silly, Sweaty, Shaky Check sugar if you have your meter.  If near or less than 70 mg/dl, treat with 1/2 cup juice or soda or take glucose tablets Check sugar 15 minutes after treatment.  If sugar still near or less than 70 mg/al and symptomatic, treat again and may need a snack with some protein (peanut butter with crackers, etc)  Fingerstick glucose (sugar) goals for home: Before meals: 80-130 mg/dl 2-Hours after meals: less than 180 mg/dl Hemoglobin A1c goal: 7% or less   Document Released: 09/18/2009 Document Revised: 12/16/2017 Document Reviewed: 07/28/2015 Elsevier Interactive Patient Education  2019 [employer].

## 2018-08-31 ENCOUNTER — Ambulatory Visit: Payer: Medicare Other | Admitting: Nurse Practitioner

## 2018-08-31 ENCOUNTER — Telehealth: Payer: Self-pay

## 2018-08-31 NOTE — Telephone Encounter (Signed)
Called patient to complete transition of care call. Spoke with Clare Gandy who assists patient. He was not with patient at the time of this call. There is no medical release form for [name]. Unable to complete transition of care call at this time. [name] did verify he would be bringing patient in Wednesday 09/02/2018 at 10:00am. He will try and have patient call back before end of the day today.   Appointment cancelled for 09/07/2018 per Melton Alar - able to  Perform hospital follow up and cpe same day- 09/02/2018

## 2018-09-02 ENCOUNTER — Encounter: Payer: Self-pay | Admitting: Nurse Practitioner

## 2018-09-02 ENCOUNTER — Other Ambulatory Visit: Payer: Self-pay

## 2018-09-02 ENCOUNTER — Ambulatory Visit (INDEPENDENT_AMBULATORY_CARE_PROVIDER_SITE_OTHER): Payer: Medicare Other | Admitting: Nurse Practitioner

## 2018-09-02 VITALS — BP 124/79 | HR 73 | Temp 98.1°F | Ht 72.0 in | Wt 285.0 lb

## 2018-09-02 DIAGNOSIS — IMO0002 Reserved for concepts with insufficient information to code with codable children: Secondary | ICD-10-CM

## 2018-09-02 DIAGNOSIS — Z6838 Body mass index (BMI) 38.0-38.9, adult: Secondary | ICD-10-CM | POA: Diagnosis not present

## 2018-09-02 DIAGNOSIS — E1169 Type 2 diabetes mellitus with other specified complication: Secondary | ICD-10-CM | POA: Diagnosis not present

## 2018-09-02 DIAGNOSIS — E1165 Type 2 diabetes mellitus with hyperglycemia: Secondary | ICD-10-CM

## 2018-09-02 DIAGNOSIS — E538 Deficiency of other specified B group vitamins: Secondary | ICD-10-CM

## 2018-09-02 DIAGNOSIS — I10 Essential (primary) hypertension: Secondary | ICD-10-CM | POA: Diagnosis not present

## 2018-09-02 DIAGNOSIS — E1142 Type 2 diabetes mellitus with diabetic polyneuropathy: Secondary | ICD-10-CM

## 2018-09-02 DIAGNOSIS — E785 Hyperlipidemia, unspecified: Secondary | ICD-10-CM

## 2018-09-02 DIAGNOSIS — G918 Other hydrocephalus: Secondary | ICD-10-CM | POA: Diagnosis not present

## 2018-09-02 LAB — BAYER DCA HB A1C WAIVED: HB A1C (BAYER DCA - WAIVED): 5.5 % (ref <7.0)

## 2018-09-02 MED ORDER — GLIPIZIDE XL 10 MG PO TB24: 10.0000 mg | ORAL_TABLET | Freq: Every day | ORAL | 5 refills | Status: DC

## 2018-09-02 MED ORDER — METFORMIN HCL ER (OSM) 1000 MG PO TB24: 1000.0000 mg | ORAL_TABLET | Freq: Every day | ORAL | 5 refills | Status: DC

## 2018-09-02 NOTE — Assessment & Plan Note (Signed)
Chronic, ongoing.  Referral to neurology for continued follow-up.

## 2018-09-02 NOTE — Progress Notes (Signed)
BP 124/79   Pulse 73   Temp 98.1 F (36.7 C) (Oral)   Ht 6' (1.829 m)   Wt 285 lb (129.3 kg)   SpO2 98%   BMI 38.65 kg/m    Subjective:    Patient ID: [name], male    DOB: Apr 29, 1971, 48 y.o.   MRN: [id number]  HPI: [name] is a 48 y.o. male  Chief Complaint  Patient presents with  . Hospitalization Follow-up   Patient friend/caregiver at bed and assists in HPI and ROS.  HOSPITAL FOLLOW UP Time since discharge: Admitted from 08/27/2018 to 08/28/2018 Hospital/facility: [facility] Diagnosis: Hypoglycemia Procedures/tests: Multiple labs, note below + CXR Consultants: diabetic nurse New medications: Glipizide and Metformin decrease to once a day Discharge instructions:  Follow-up with PCP Status: better  DIABETES Last A1C on record was from [facility] March 2017 = 6.5%.  His meal intake varies at home because it depends on what his brother wants to eat.  Sometimes it may be healthy meal and other times it may be something unhealthy, "usually not so healthy". Hypoglycemic episodes:yes Polydipsia/polyuria: no Visual disturbance: no Chest pain: no Paresthesias: no Glucose Monitoring: yes  Accucheck frequency: Daily  Fasting glucose: 99 yesterday morning, 100-200 in morning on average  Post prandial:  Evening:  Before meals: Taking Insulin?: no  Long acting insulin:  Short acting insulin: Blood Pressure Monitoring: not checking Retinal Examination: Not up to Date Foot Exam: Not up to Date Pneumovax: Up to Date Influenza: Up to Date Aspirin: yes   CHRONIC BRAIN HYDROCEPHALUS SYNDROME: Had been followed by [facility]rology with last visit 12/26/2015.  During that visit it was recommended a referral to neurosurgery be placed for evaluation for shunt and determine if group home setting would benefit patient.  He has h/o seizures and a previous stroke, history of two strokes, last one was > 4 years ago.  Seizures started after CVA.  Has had three falls  this year without injury, on the grass with each fall.  His caregiver is present and reports that his gait has changed, but feels it is due to his current diabetic shoes which are older and they wish to obtain new ones and have foot exam.  At baseline he also wears a brace to right ankle.  HYPERTENSION / HYPERLIPIDEMIA Satisfied with current treatment? yes Duration of hypertension: chronic BP monitoring frequency: not checking BP range:  BP medication side effects: no Duration of hyperlipidemia: chronic Cholesterol medication side effects: no Cholesterol supplements: none Medication compliance: good compliance Aspirin: yes Recent stressors: no Recurrent headaches: no Visual changes: no Palpitations: no Dyspnea: no Chest pain: no Lower extremity edema: no Dizzy/lightheaded: no   DEPRESSION Mood status: controlled Satisfied with current treatment?: yes Symptom severity: mild  Duration of current treatment : chronic Side effects: no Medication compliance: excellent compliance Psychotherapy/counseling: has been to therapy in past Depressed mood: yes Anxious mood: no Anhedonia: no Significant weight loss or gain: no Insomnia: none Fatigue: yes Feelings of worthlessness or guilt: no Impaired concentration/indecisiveness: yes Suicidal ideations: no Hopelessness: no Crying spells: no Depression screen [facility] 2/9 09/02/2018 12/28/2015 12/18/2015 10/16/2015  Decreased Interest 2 0 0 0  Down, Depressed, Hopeless 1 0 0 0  PHQ - 2 Score 3 0 0 0  Altered sleeping 1 - - -  Tired, decreased energy 1 - - -  Change in appetite 1 - - -  Feeling bad or failure about yourself  1 - - -  Trouble  concentrating 2 - - -  Moving slowly or fidgety/restless 1 - - -  Suicidal thoughts 0 - - -  PHQ-9 Score 10 - - -  Difficult doing work/chores Not difficult at all - - -   FUNCTIONAL SURVEY Is the patient deaf or have difficulty hearing?: No Does the patient have difficulty seeing, even when  wearing glasses/contacts?: No Does the patient have difficulty concentrating, remembering, or making decisions?: No Does the patient have difficulty walking or climbing stairs?: Yes Does the patient have difficulty dressing or bathing?: Yes Does the patient have difficulty doing errands alone such as visiting a doctor's office or shopping?: No   Relevant past medical, surgical, family and social history reviewed and updated as indicated. Interim medical history since our last visit reviewed. Allergies and medications reviewed and updated.  Review of Systems  Constitutional: Negative for activity change, fatigue and fever.  HENT: Negative for congestion, ear pain, rhinorrhea, sinus pressure, sinus pain and sore throat.   Eyes: Negative for pain, discharge and redness.  Respiratory: Negative for cough, chest tightness, shortness of breath and wheezing.   Cardiovascular: Negative for chest pain, palpitations and leg swelling.  Gastrointestinal: Negative for abdominal distention and abdominal pain.  Endocrine: Negative for cold intolerance, polydipsia, polyphagia and polyuria.  Genitourinary: Negative.   Musculoskeletal: Negative for back pain, myalgias and neck pain.  Skin: Negative.   Allergic/Immunologic: Negative.   Neurological: Negative for dizziness, syncope, numbness and headaches.  Hematological: Negative.   Psychiatric/Behavioral: Negative for behavioral problems.    Per HPI unless specifically indicated above     Objective:    BP 124/79   Pulse 73   Temp 98.1 F (36.7 C) (Oral)   Ht 6' (1.829 m)   Wt 285 lb (129.3 kg)   SpO2 98%   BMI 38.65 kg/m   Wt Readings from Last 3 Encounters:  09/02/18 285 lb (129.3 kg)  08/27/18 230 lb (104.3 kg)  08/12/18 281 lb (127.5 kg)    Physical Exam Vitals signs and nursing note reviewed.  Constitutional:      Appearance: He is well-developed.  HENT:     Head: Normocephalic and atraumatic.     Right Ear: Hearing normal. No  drainage.     Left Ear: Hearing normal. No drainage.     Mouth/Throat:     Pharynx: Uvula midline.  Eyes:     General: Lids are normal.        Right eye: No discharge.        Left eye: No discharge.     Conjunctiva/sclera: Conjunctivae normal.     Pupils: Pupils are equal, round, and reactive to light.     Comments: Strabismus present  Neck:     Musculoskeletal: Normal range of motion and neck supple.     Thyroid: No thyromegaly.     Vascular: No carotid bruit or JVD.     Trachea: Trachea normal.  Cardiovascular:     Rate and Rhythm: Normal rate and regular rhythm.     Heart sounds: Normal heart sounds, S1 normal and S2 normal. No murmur. No gallop.   Pulmonary:     Effort: Pulmonary effort is normal.     Breath sounds: Normal breath sounds.  Abdominal:     General: Bowel sounds are normal.     Palpations: Abdomen is soft. There is no hepatomegaly or splenomegaly.  Musculoskeletal: Normal range of motion.     Right lower leg: No edema.     Left lower  leg: No edema.     Comments: Presents in W/C.    Skin:    General: Skin is warm and dry.     Capillary Refill: Capillary refill takes less than 2 seconds.     Findings: No rash.  Neurological:     Mental Status: He is alert and oriented to person, place, and time.     Deep Tendon Reflexes: Reflexes are normal and symmetric.  Psychiatric:        Mood and Affect: Mood normal.        Behavior: Behavior normal.        Thought Content: Thought content normal.        Judgment: Judgment normal.    Diabetic Foot Exam - Simple   Simple Foot Form Visual Inspection See comments:  Yes Sensation Testing Intact to touch and monofilament testing bilaterally:  Yes Pulse Check Posterior Tibialis and Dorsalis pulse intact bilaterally:  Yes Comments Xerosis bilateral feet.     Results for orders placed or performed during the hospital encounter of 08/27/18  CBC  Result Value Ref Range   WBC 4.0 4.0 - 10.5 K/uL   RBC 4.76 4.22 -  5.81 MIL/uL   Hemoglobin 14.5 13.0 - 17.0 g/dL   HCT 78.2 95.6 - 21.3 %   MCV 93.3 80.0 - 100.0 fL   MCH 30.5 26.0 - 34.0 pg   MCHC 32.7 30.0 - 36.0 g/dL   RDW 08.6 57.8 - 46.9 %   Platelets 251 150 - 400 K/uL   nRBC 0.0 0.0 - 0.2 %  Comprehensive metabolic panel  Result Value Ref Range   Sodium 135 135 - 145 mmol/L   Potassium 4.7 3.5 - 5.1 mmol/L   Chloride 101 98 - 111 mmol/L   CO2 21 (L) 22 - 32 mmol/L   Glucose, Bld 129 (H) 70 - 99 mg/dL   BUN 10 6 - 20 mg/dL   Creatinine, Ser 6.29 0.61 - 1.24 mg/dL   Calcium 8.8 (L) 8.9 - 10.3 mg/dL   Total Protein 7.8 6.5 - 8.1 g/dL   Albumin 4.3 3.5 - 5.0 g/dL   AST 22 15 - 41 U/L   ALT 16 0 - 44 U/L   Alkaline Phosphatase 54 38 - 126 U/L   Total Bilirubin 1.0 0.3 - 1.2 mg/dL   GFR calc non Af Amer >60 >60 mL/min   GFR calc Af Amer >60 >60 mL/min   Anion gap 13 5 - 15  Urinalysis, Complete w Microscopic  Result Value Ref Range   Color, Urine YELLOW (A) YELLOW   APPearance HAZY (A) CLEAR   Specific Gravity, Urine 1.014 1.005 - 1.030   pH 5.0 5.0 - 8.0   Glucose, UA 50 (A) NEGATIVE mg/dL   Hgb urine dipstick SMALL (A) NEGATIVE   Bilirubin Urine NEGATIVE NEGATIVE   Ketones, ur 5 (A) NEGATIVE mg/dL   Protein, ur NEGATIVE NEGATIVE mg/dL   Nitrite NEGATIVE NEGATIVE   Leukocytes,Ua NEGATIVE NEGATIVE   RBC / HPF 0-5 0 - 5 RBC/hpf   WBC, UA 0-5 0 - 5 WBC/hpf   Bacteria, UA NONE SEEN NONE SEEN   Squamous Epithelial / LPF NONE SEEN 0 - 5   Mucus PRESENT    Uric Acid Crys, UA PRESENT   Glucose, capillary  Result Value Ref Range   Glucose-Capillary 40 (LL) 70 - 99 mg/dL  Glucose, capillary  Result Value Ref Range   Glucose-Capillary 110 (H) 70 - 99 mg/dL   Comment 1  Document in Chart   CBC  Result Value Ref Range   WBC 4.6 4.0 - 10.5 K/uL   RBC 4.76 4.22 - 5.81 MIL/uL   Hemoglobin 14.4 13.0 - 17.0 g/dL   HCT 74.2 59.5 - 63.8 %   MCV 92.6 80.0 - 100.0 fL   MCH 30.3 26.0 - 34.0 pg   MCHC 32.7 30.0 - 36.0 g/dL   RDW 75.6 43.3 -  29.5 %   Platelets 265 150 - 400 K/uL   nRBC 0.0 0.0 - 0.2 %  Creatinine, serum  Result Value Ref Range   Creatinine, Ser 0.86 0.61 - 1.24 mg/dL   GFR calc non Af Amer >60 >60 mL/min   GFR calc Af Amer >60 >60 mL/min  Hemoglobin A1c  Result Value Ref Range   Hgb A1c MFr Bld 5.4 4.8 - 5.6 %   Mean Plasma Glucose 108.28 mg/dL  Glucose, capillary  Result Value Ref Range   Glucose-Capillary 143 (H) 70 - 99 mg/dL   Comment 1 Notify RN    Comment 2 Document in Chart   HIV Antibody (routine testing w rflx)  Result Value Ref Range   HIV Screen 4th Generation wRfx Non Reactive Non Reactive  Basic metabolic panel  Result Value Ref Range   Sodium 137 135 - 145 mmol/L   Potassium 3.6 3.5 - 5.1 mmol/L   Chloride 104 98 - 111 mmol/L   CO2 26 22 - 32 mmol/L   Glucose, Bld 165 (H) 70 - 99 mg/dL   BUN 11 6 - 20 mg/dL   Creatinine, Ser 1.88 0.61 - 1.24 mg/dL   Calcium 8.3 (L) 8.9 - 10.3 mg/dL   GFR calc non Af Amer >60 >60 mL/min   GFR calc Af Amer >60 >60 mL/min   Anion gap 7 5 - 15  Glucose, capillary  Result Value Ref Range   Glucose-Capillary 220 (H) 70 - 99 mg/dL  Glucose, capillary  Result Value Ref Range   Glucose-Capillary 301 (H) 70 - 99 mg/dL  Glucose, capillary  Result Value Ref Range   Glucose-Capillary 214 (H) 70 - 99 mg/dL   Comment 1 Notify RN   Glucose, capillary  Result Value Ref Range   Glucose-Capillary 222 (H) 70 - 99 mg/dL   Comment 1 Notify RN   Glucose, capillary  Result Value Ref Range   Glucose-Capillary 177 (H) 70 - 99 mg/dL   Comment 1 Notify RN   Glucose, capillary  Result Value Ref Range   Glucose-Capillary 131 (H) 70 - 99 mg/dL   Comment 1 Notify RN   Glucose, capillary  Result Value Ref Range   Glucose-Capillary 207 (H) 70 - 99 mg/dL   Comment 1 Notify RN    RECENT CXR IN HOSPITAL: FINDINGS: No edema or consolidation. Heart size and pulmonary vascularity are normal. No adenopathy. A loop recorder is noted on the left. No evident bone  lesions.  IMPRESSION: No edema or consolidation.     Assessment & Plan:   Problem List Items Addressed This Visit      Cardiovascular and Mediastinum   HTN (hypertension)    Chronic, ongoing.  BP below goal today.  Continue current medication regimen.        Relevant Orders   CBC with Differential/Platelet     Endocrine   Uncontrolled type 2 diabetes mellitus with peripheral neuropathy (HCC) - Primary    Chronic, ongoing.  A1C today 5.5% with recent hypoglycemia.  Will discontinue Glipizide.  Continue Metformin at  this time.  Patient to alert provider if consistent morning BS <70 and will adjust daily Metformin dose.  Return in 3 months for follow-up.  CCM referral placed to assist with management and assistance in community for patient.      Relevant Medications   metformin (FORTAMET) 1000 MG (OSM) 24 hr tablet   Other Relevant Orders   Bayer DCA Hb A1c Waived   Ambulatory referral to Chronic Care Management Services   Ambulatory referral to Podiatry   Ambulatory referral to Ophthalmology   Hyperlipidemia associated with type 2 diabetes mellitus (HCC)    Chronic, ongoing.  Lipid panel today.  Discuss addition of statin next visit.      Relevant Medications   metformin (FORTAMET) 1000 MG (OSM) 24 hr tablet   Other Relevant Orders   Lipid Panel w/o Chol/HDL Ratio   Comprehensive metabolic panel     Nervous and Auditory   Chronic brain-hydrocephalus syndrome (HCC)    Chronic, ongoing.  Referral to neurology for continued follow-up.        Relevant Orders   Ambulatory referral to Neurology     Other   Obesity    Recommend continued focus on healthy diet choices and chair exercises at home.      Relevant Medications   metformin (FORTAMET) 1000 MG (OSM) 24 hr tablet    Other Visit Diagnoses    B12 deficiency       History of low level per report.  Check B12 today.   Relevant Orders   B12       Follow up plan: Return in about 3 months (around 12/01/2018)  for T2DM, HTN/HLD.

## 2018-09-02 NOTE — Assessment & Plan Note (Signed)
Chronic, ongoing.  A1C today 5.5% with recent hypoglycemia.  Will discontinue Glipizide.  Continue Metformin at this time.  Patient to alert provider if consistent morning BS <70 and will adjust daily Metformin dose.  Return in 3 months for follow-up.  CCM referral placed to assist with management and assistance in community for patient.

## 2018-09-02 NOTE — Assessment & Plan Note (Signed)
Chronic, ongoing.  BP below goal today.  Continue current medication regimen.   

## 2018-09-02 NOTE — Patient Instructions (Signed)
Diabetes Mellitus and Nutrition, Adult  When you have diabetes (diabetes mellitus), it is very important to have healthy eating habits because your blood sugar (glucose) levels are greatly affected by what you eat and drink. Eating healthy foods in the appropriate amounts, at about the same times every day, can help you:  · Control your blood glucose.  · Lower your risk of heart disease.  · Improve your blood pressure.  · Reach or maintain a healthy weight.  Every person with diabetes is different, and each person has different needs for a meal plan. Your health care provider may recommend that you work with a diet and nutrition specialist (dietitian) to make a meal plan that is best for you. Your meal plan may vary depending on factors such as:  · The calories you need.  · The medicines you take.  · Your weight.  · Your blood glucose, blood pressure, and cholesterol levels.  · Your activity level.  · Other health conditions you have, such as heart or kidney disease.  How do carbohydrates affect me?  Carbohydrates, also called carbs, affect your blood glucose level more than any other type of food. Eating carbs naturally raises the amount of glucose in your blood. Carb counting is a method for keeping track of how many carbs you eat. Counting carbs is important to keep your blood glucose at a healthy level, especially if you use insulin or take certain oral diabetes medicines.  It is important to know how many carbs you can safely have in each meal. This is different for every person. Your dietitian can help you calculate how many carbs you should have at each meal and for each snack.  Foods that contain carbs include:  · Bread, cereal, rice, pasta, and crackers.  · Potatoes and corn.  · Peas, beans, and lentils.  · Milk and yogurt.  · Fruit and juice.  · Desserts, such as cakes, cookies, ice cream, and candy.  How does alcohol affect me?  Alcohol can cause a sudden decrease in blood glucose (hypoglycemia),  especially if you use insulin or take certain oral diabetes medicines. Hypoglycemia can be a life-threatening condition. Symptoms of hypoglycemia (sleepiness, dizziness, and confusion) are similar to symptoms of having too much alcohol.  If your health care provider says that alcohol is safe for you, follow these guidelines:  · Limit alcohol intake to no more than 1 drink per day for nonpregnant women and 2 drinks per day for men. One drink equals 12 oz of beer, 5 oz of wine, or 1½ oz of hard liquor.  · Do not drink on an empty stomach.  · Keep yourself hydrated with water, diet soda, or unsweetened iced tea.  · Keep in mind that regular soda, juice, and other mixers may contain a lot of sugar and must be counted as carbs.  What are tips for following this plan?    Reading food labels  · Start by checking the serving size on the "Nutrition Facts" label of packaged foods and drinks. The amount of calories, carbs, fats, and other nutrients listed on the label is based on one serving of the item. Many items contain more than one serving per package.  · Check the total grams (g) of carbs in one serving. You can calculate the number of servings of carbs in one serving by dividing the total carbs by 15. For example, if a food has 30 g of total carbs, it would be equal to 2   servings of carbs.  · Check the number of grams (g) of saturated and trans fats in one serving. Choose foods that have low or no amount of these fats.  · Check the number of milligrams (mg) of salt (sodium) in one serving. Most people should limit total sodium intake to less than 2,300 mg per day.  · Always check the nutrition information of foods labeled as "low-fat" or "nonfat". These foods may be higher in added sugar or refined carbs and should be avoided.  · Talk to your dietitian to identify your daily goals for nutrients listed on the label.  Shopping  · Avoid buying canned, premade, or processed foods. These foods tend to be high in fat, sodium,  and added sugar.  · Shop around the outside edge of the grocery store. This includes fresh fruits and vegetables, bulk grains, fresh meats, and fresh dairy.  Cooking  · Use low-heat cooking methods, such as baking, instead of high-heat cooking methods like deep frying.  · Cook using healthy oils, such as olive, canola, or sunflower oil.  · Avoid cooking with butter, cream, or high-fat meats.  Meal planning  · Eat meals and snacks regularly, preferably at the same times every day. Avoid going long periods of time without eating.  · Eat foods high in fiber, such as fresh fruits, vegetables, beans, and whole grains. Talk to your dietitian about how many servings of carbs you can eat at each meal.  · Eat 4-6 ounces (oz) of lean protein each day, such as lean meat, chicken, fish, eggs, or tofu. One oz of lean protein is equal to:  ? 1 oz of meat, chicken, or fish.  ? 1 egg.  ? ¼ cup of tofu.  · Eat some foods each day that contain healthy fats, such as avocado, nuts, seeds, and fish.  Lifestyle  · Check your blood glucose regularly.  · Exercise regularly as told by your health care provider. This may include:  ? 150 minutes of moderate-intensity or vigorous-intensity exercise each week. This could be brisk walking, biking, or water aerobics.  ? Stretching and doing strength exercises, such as yoga or weightlifting, at least 2 times a week.  · Take medicines as told by your health care provider.  · Do not use any products that contain nicotine or tobacco, such as cigarettes and e-cigarettes. If you need help quitting, ask your health care provider.  · Work with a counselor or diabetes educator to identify strategies to manage stress and any emotional and social challenges.  Questions to ask a health care provider  · Do I need to meet with a diabetes educator?  · Do I need to meet with a dietitian?  · What number can I call if I have questions?  · When are the best times to check my blood glucose?  Where to find more  information:  · American Diabetes Association: diabetes.org  · Academy of Nutrition and Dietetics: www.eatright.org  · National Institute of Diabetes and Digestive and Kidney Diseases (NIH): www.niddk.nih.gov  Summary  · A healthy meal plan will help you control your blood glucose and maintain a healthy lifestyle.  · Working with a diet and nutrition specialist (dietitian) can help you make a meal plan that is best for you.  · Keep in mind that carbohydrates (carbs) and alcohol have immediate effects on your blood glucose levels. It is important to count carbs and to use alcohol carefully.  This information is not intended to   replace advice given to you by your health care provider. Make sure you discuss any questions you have with your health care provider.  Document Released: 03/21/2005 Document Revised: 01/22/2017 Document Reviewed: 07/29/2016  Elsevier Interactive Patient Education © 2019 Elsevier Inc.

## 2018-09-02 NOTE — Assessment & Plan Note (Signed)
Recommend continued focus on healthy diet choices and chair exercises at home.

## 2018-09-02 NOTE — Assessment & Plan Note (Signed)
Chronic, ongoing.  Lipid panel today.  Discuss addition of statin next visit.

## 2018-09-03 ENCOUNTER — Other Ambulatory Visit: Payer: Self-pay | Admitting: Nurse Practitioner

## 2018-09-03 ENCOUNTER — Telehealth: Payer: Self-pay | Admitting: Nurse Practitioner

## 2018-09-03 LAB — CBC WITH DIFFERENTIAL/PLATELET
Basophils Absolute: 0 10*3/uL (ref 0.0–0.2)
Basos: 1 %
EOS (ABSOLUTE): 0.3 10*3/uL (ref 0.0–0.4)
Eos: 9 %
Hematocrit: 38 % (ref 37.5–51.0)
Hemoglobin: 13.1 g/dL (ref 13.0–17.7)
Immature Grans (Abs): 0 10*3/uL (ref 0.0–0.1)
Immature Granulocytes: 0 %
LYMPHS ABS: 1 10*3/uL (ref 0.7–3.1)
Lymphs: 24 %
MCH: 30.6 pg (ref 26.6–33.0)
MCHC: 34.5 g/dL (ref 31.5–35.7)
MCV: 89 fL (ref 79–97)
MONOS ABS: 0.4 10*3/uL (ref 0.1–0.9)
Monocytes: 10 %
NEUTROS ABS: 2.3 10*3/uL (ref 1.4–7.0)
Neutrophils: 56 %
Platelets: 275 10*3/uL (ref 150–450)
RBC: 4.28 x10E6/uL (ref 4.14–5.80)
RDW: 13.2 % (ref 11.6–15.4)
WBC: 4 10*3/uL (ref 3.4–10.8)

## 2018-09-03 LAB — COMPREHENSIVE METABOLIC PANEL
ALBUMIN: 4.3 g/dL (ref 4.0–5.0)
ALT: 14 IU/L (ref 0–44)
AST: 11 IU/L (ref 0–40)
Albumin/Globulin Ratio: 2 (ref 1.2–2.2)
Alkaline Phosphatase: 64 IU/L (ref 39–117)
BUN/Creatinine Ratio: 11 (ref 9–20)
BUN: 12 mg/dL (ref 6–24)
Bilirubin Total: 0.5 mg/dL (ref 0.0–1.2)
CO2: 20 mmol/L (ref 20–29)
CREATININE: 1.14 mg/dL (ref 0.76–1.27)
Calcium: 9.8 mg/dL (ref 8.7–10.2)
Chloride: 102 mmol/L (ref 96–106)
GFR calc Af Amer: 89 mL/min/{1.73_m2} (ref >59)
GFR, EST NON AFRICAN AMERICAN: 77 mL/min/{1.73_m2} (ref >59)
Globulin, Total: 2.1 g/dL (ref 1.5–4.5)
Glucose: 151 mg/dL — ABNORMAL HIGH (ref 65–99)
Potassium: 4.9 mmol/L (ref 3.5–5.2)
Sodium: 141 mmol/L (ref 134–144)
Total Protein: 6.4 g/dL (ref 6.0–8.5)

## 2018-09-03 LAB — VITAMIN B12: Vitamin B-12: 223 pg/mL — ABNORMAL LOW (ref 232–1245)

## 2018-09-03 LAB — LIPID PANEL W/O CHOL/HDL RATIO
Cholesterol, Total: 116 mg/dL (ref 100–199)
HDL: 50 mg/dL (ref >39)
LDL Calculated: 52 mg/dL (ref 0–99)
Triglycerides: 68 mg/dL (ref 0–149)
VLDL Cholesterol Cal: 14 mg/dL (ref 5–40)

## 2018-09-03 MED ORDER — VITAMIN B-12 1000 MCG PO TABS: 1000.0000 ug | ORAL_TABLET | Freq: Every day | ORAL | 5 refills | Status: DC

## 2018-09-03 NOTE — Progress Notes (Signed)
Patient requesting B12 oral supplement be sent in as prescription to see if he may obtain it this way due to fixed income.  Will send in script to pharmacy.

## 2018-09-03 NOTE — Telephone Encounter (Signed)
Pt calling with additional questions about Vitamin B results.  Wants to know if Vitamin B can be prescribed for him because he is on a fixed income rather than buying OTC.

## 2018-09-03 NOTE — Telephone Encounter (Signed)
Please let him know I sent this to [facility].  We will see if we can do it this way.  It allowed me to send script, so hopefully this may be more affordable option for him.  Thanks.

## 2018-09-03 NOTE — Telephone Encounter (Signed)
Clare Gandy (DPR verified) returned call for pt's lab results. NT unavailable. Please advise. [phone]  Copied from CRM [id number]. Topic: Quick Communication - Lab Results (Clinic Use ONLY) >> Sep 03, 2018  9:43 AM Richarda Overlie, CMA wrote: Called patient to inform them of recent lab results. When patient returns call, triage nurse may disclose results.

## 2018-09-03 NOTE — Telephone Encounter (Signed)
Charted in result notes. 

## 2018-09-07 ENCOUNTER — Ambulatory Visit: Payer: Self-pay | Admitting: *Deleted

## 2018-09-07 ENCOUNTER — Inpatient Hospital Stay: Payer: Medicare Other | Admitting: Nurse Practitioner

## 2018-09-07 NOTE — Telephone Encounter (Signed)
Spoke with [name] and he stated that he has not picked up script yet but he will call back if it isn't affordable.

## 2018-09-07 NOTE — Chronic Care Management (AMB) (Signed)
  Chronic Care Management   Outreach Note  09/07/2018 Name: [name] MRN: [id number] DOB: 1970/10/02  Referred by: [name], NP Reason for referral : Chronic Care Management (New Referral - DM/Medication Management)   An unsuccessful telephone outreach to @M @ @NAME @ was attempted today. @M @ @NAME @ was referred to the case management team by Aura Dials FNP for assistance with DM and Medication Management  Follow Up Plan: The CM team will reach out to the patient again over the next 3 days.    Marja Kays MHA,BSN,RN,CCM Nurse Care Coordinator [facility] / [facility] Care Management [phone]

## 2018-09-09 ENCOUNTER — Ambulatory Visit: Payer: Self-pay | Admitting: Pharmacist

## 2018-09-09 NOTE — Chronic Care Management (AMB) (Signed)
  Chronic Care Management   Note  09/09/2018 Name: [name] MRN: [id number] DOB: March 28, 1971  Patient is a 48 year old male patient of Aura Dials, NP, referred to CCM services for HTN, T2DM, HLD, chronic brain-hydrocephalus syndrome, and hx CVA.   Contacted patient's friend/caregiver, Clare Gandy (on DPR). HIPAA verifiers identified. He agreed that patient would benefit from services. Scheduled an office appointment for next week 09/16/2018.   Catie Feliz Beam, PharmD Clinical Pharmacist [facility] [phone]

## 2018-09-15 ENCOUNTER — Ambulatory Visit (INDEPENDENT_AMBULATORY_CARE_PROVIDER_SITE_OTHER): Payer: Medicare Other

## 2018-09-15 ENCOUNTER — Other Ambulatory Visit: Payer: Self-pay | Admitting: Podiatry

## 2018-09-15 ENCOUNTER — Encounter: Payer: Self-pay | Admitting: Podiatry

## 2018-09-15 ENCOUNTER — Ambulatory Visit (INDEPENDENT_AMBULATORY_CARE_PROVIDER_SITE_OTHER): Payer: Medicare Other | Admitting: Podiatry

## 2018-09-15 VITALS — BP 159/89 | HR 69

## 2018-09-15 DIAGNOSIS — M79676 Pain in unspecified toe(s): Secondary | ICD-10-CM

## 2018-09-15 DIAGNOSIS — Z9181 History of falling: Secondary | ICD-10-CM

## 2018-09-15 DIAGNOSIS — E0843 Diabetes mellitus due to underlying condition with diabetic autonomic (poly)neuropathy: Secondary | ICD-10-CM | POA: Diagnosis not present

## 2018-09-15 DIAGNOSIS — M79673 Pain in unspecified foot: Secondary | ICD-10-CM | POA: Diagnosis not present

## 2018-09-15 DIAGNOSIS — B351 Tinea unguium: Secondary | ICD-10-CM | POA: Diagnosis not present

## 2018-09-15 DIAGNOSIS — E1140 Type 2 diabetes mellitus with diabetic neuropathy, unspecified: Secondary | ICD-10-CM

## 2018-09-15 DIAGNOSIS — Z8673 Personal history of transient ischemic attack (TIA), and cerebral infarction without residual deficits: Secondary | ICD-10-CM

## 2018-09-15 NOTE — Progress Notes (Signed)
   HPI: 48 year old male h/o diabetes mellitus, and history of multiple strokes presents to the office today for evaluation of instability to his lower extremities.  Patient states that his ankle rolls when walking.  He also states that he does have some neuropathy which is previously diagnosed by prior physicians.  Patient states that he feels like he needs better stability to his right ankle.  Patient also complains of symptomatic long dystrophic toenails 1-5 bilateral.  Past Medical History:  Diagnosis Date  . Depression   . Diabetes mellitus without complication (HCC)   . Hydrocephalus in adult [facility])   . Hyperlipidemia   . Hypertension   . Seizures (HCC)   . Stroke [facility])      Physical Exam: General: The patient is alert and oriented x3 in no acute distress.  Dermatology: Skin is warm, dry and supple bilateral lower extremities. Negative for open lesions or macerations.  Hypertrophic elongated nails noted 1-5 bilateral  Vascular: Palpable pedal pulses bilaterally.  There is some moderate edema noted to the right foot and ankle. Capillary refill within normal limits.  Neurological: Epicritic and protective threshold diminished bilaterally.   Musculoskeletal Exam: Range of motion within normal limits to all pedal and ankle joints bilateral.  Loss of muscle strength mobility noted to the bilateral lower extremity secondary to the previous history of strokes  Radiographic Exam:  Normal osseous mineralization. Joint spaces preserved. No fracture/dislocation/boney destruction.    Assessment: 1. H/o strokes, multiple 2. Diabetes mellitus w/ peripheral polyneuropathy 3. Pain due to onychomycosis of toenail 4. H/o multiple recurrent falls   Plan of Care:  1. Patient evaluated. X-Rays reviewed.  2.  Mechanical debridement of nails 1-5 was performed using a nail nipper without incident or bleeding 3.  Today we can arrange an appointment for [facility], Pedorthist, for custom molded bilateral  [name] balance braces 4.  Return to clinic as needed      Felecia Shelling, DPM [facility]  Dr. Felecia Shelling, DPM    [address] [place], [place] [address]                Office [phone]  Fax [phone]

## 2018-09-16 ENCOUNTER — Ambulatory Visit: Payer: Medicare Other | Admitting: *Deleted

## 2018-09-16 ENCOUNTER — Ambulatory Visit: Payer: Medicare Other | Admitting: Pharmacist

## 2018-09-16 ENCOUNTER — Other Ambulatory Visit: Payer: Self-pay

## 2018-09-16 DIAGNOSIS — E1165 Type 2 diabetes mellitus with hyperglycemia: Secondary | ICD-10-CM

## 2018-09-16 DIAGNOSIS — IMO0002 Reserved for concepts with insufficient information to code with codable children: Secondary | ICD-10-CM

## 2018-09-16 DIAGNOSIS — G918 Other hydrocephalus: Secondary | ICD-10-CM

## 2018-09-16 DIAGNOSIS — E1142 Type 2 diabetes mellitus with diabetic polyneuropathy: Secondary | ICD-10-CM

## 2018-09-16 DIAGNOSIS — I10 Essential (primary) hypertension: Secondary | ICD-10-CM

## 2018-09-16 NOTE — Chronic Care Management (AMB) (Signed)
Chronic Care Management   Note  09/16/2018 Name: [name] MRN: [id number] DOB: 04-18-1971   Subjective:  Patient is a 48 year old male, seen by Patrick Guarneri, NP for primary care, referred to chronic care management for assistance with diabetes, hypertension, medication management.   Met with patient and his caregiver, [name], today in the office with CCM RN.    Review of patient status, including review of consultants reports, laboratory and other test data, was performed as part of comprehensive evaluation and provision of chronic care management services.   Objective: Lab Results  Component Value Date   CREATININE 1.14 09/02/2018   CREATININE 1.04 08/28/2018   CREATININE 0.86 08/27/2018    Lab Results  Component Value Date   HGBA1C 5.5 09/02/2018    Lipid Panel     Component Value Date/Time   CHOL 116 09/02/2018 1056   CHOL 205 (H) 12/15/2013 0408   TRIG 68 09/02/2018 1056   TRIG 154 12/15/2013 0408   HDL 50 09/02/2018 1056   HDL 49 12/15/2013 0408   CHOLHDL 2.5 10/11/2015 1027   VLDL 31 12/15/2013 0408   LDLCALC 52 09/02/2018 1056   LDLCALC 125 (H) 12/15/2013 0408    BP Readings from Last 3 Encounters:  09/15/18 (!) 159/89  09/02/18 124/79  08/28/18 (!) 141/83    No Known Allergies  Medications Reviewed Today    Reviewed by Thereasa Distance, [place] (Certified Medical Assistant) on 09/15/18 at 1045  Med List Status: <None>  Medication Order Taking? Sig Documenting Provider Last Dose Status Informant  amLODipine (NORVASC) 10 MG tablet [id number] Yes Take 1 tablet (10 mg total) by mouth daily. Bary Leriche, PA-C Taking Active Self  aspirin 81 MG chewable tablet [id number] Yes Chew 1 tablet (81 mg total) by mouth daily. Bary Leriche, PA-C Taking Active Self  atorvastatin (LIPITOR) 80 MG tablet [id number] Yes  [provider] Taking Active   blood glucose meter kit and supplies KIT [id number] Yes Dispense based on patient and insurance preference.  Use up to four times daily as directed. (FOR ICD-9 250.00, 250.01). [name] T, NP Taking Active Self  carvedilol (COREG) 6.25 MG tablet [id number] Yes Take 1 tablet (6.25 mg total) by mouth 2 (two) times daily with a meal. Love, Pamela S, PA-C Taking Active Self  citalopram (CELEXA) 10 MG tablet [id number] Yes Take 1 tablet (10 mg total) by mouth daily. Bary Leriche, PA-C Taking Active Self  clopidogrel (PLAVIX) 75 MG tablet [id number] Yes Take 1 tablet (75 mg total) by mouth daily with breakfast. Love, Ivan Anchors, PA-C Taking Active Self  gabapentin (NEURONTIN) 300 MG capsule [id number] Yes Take 1 capsule (300 mg total) by mouth 3 (three) times daily. Bary Leriche, PA-C Taking Active Self  glucose blood (ACCU-CHEK AVIVA) test strip [id number] Yes Use as instructed Roselee Nova, MD Taking Active Self  Lancet Devices ([place]) lancets [id number] Yes Use as instructed Roselee Nova, MD Taking Active Self  lisinopril (PRINIVIL,ZESTRIL) 5 MG tablet [id number] Yes Take 1 tablet (5 mg total) by mouth daily. Bary Leriche, PA-C Taking Active Self  metformin (FORTAMET) 1000 MG (OSM) 24 hr tablet [id number] Yes Take 1 tablet (1,000 mg total) by mouth daily with breakfast. [name] T, NP Taking Active   metFORMIN (GLUCOPHAGE-XR) 500 MG 24 hr tablet [id number] Yes  [provider] Taking Active   vitamin B-12 (CYANOCOBALAMIN) 1000 MCG tablet [id number] Yes Take 1 tablet (1,000 mcg total) by mouth  daily. Venita Lick, NP Taking Active   Med List Note Clemencia Course, [place] 08/27/18 1128): Patient uses pill pack           Assessment:    Goals Addressed            This Visit's Progress     Patient Stated   . "His blood sugar drops" (pt-stated)       Current Barriers:  Marland Kitchen Knowledge Deficits related to hypoglycemia - patient has hypoglycemia unawareness; patient's caregiver, [name], is unsure of appropriate hypoglycemia management  Pharmacist Clinical  Goal(s):  Marland Kitchen Over the next 30 days, patient and his caregiver team will demonstrate improved understanding of hypoglycemia management as evidenced by appropriate treatment . Over the next 30 days, patient and his caregiver team will demonstrate improved understanding of hypoglycemia surveillance by writing down blood sugars in the provided book  Interventions: . Comprehensive medication review performed.  . Counseled on signs/symptoms and management of hypoglycemia  Patient Self Care Activities:  . Currently UNABLE TO independently recognize and treat hypoglycemia  Plan: . PharmD will ensure that glipizide was discontinued and is not in the patient Pill Packs  Initial goal documentation     . "We aren't sure what medications he's taking" (pt-stated)       Current Barriers:  Marland Kitchen Knowledge Deficits related to medication management - patient is unable to verbalize his medications, nor is his caregiver, [name]; however, he does receive medictions through [facility] at [place]):  Marland Kitchen Over the next 30 days, patient will demonstrate improved understanding of prescribed medications and rationale for usage as evidenced by knowledge of use of each medication  Interventions: . Comprehensive medication review performed. Marland Kitchen Collaboration with [place]  to ensure Pill Packs match prescribed medications  Patient Self Care Activities:  . Currently UNABLE TO independently manage medications or self-adminster medications; caregiver, [name], administers medications every day  Plan: . PharmD willcontact [facility] to discuss medications in pill packs - phone line is down today, will follow up within 5-7 business days  Initial goal documentation       PharmD will contact [facility] within 5-7 business days to review Pill Pack contents.  Catie Darnelle Maffucci, PharmD Clinical Pharmacist [place] [phone]

## 2018-09-16 NOTE — Patient Instructions (Signed)
Visit Information  Goals Addressed            This Visit's Progress     Patient Stated   . "His blood sugar drops" (pt-stated)       Current Barriers:  Marland Kitchen Knowledge Deficits related to hypoglycemia - patient has hypoglycemia unawareness; patient's caregiver, [name], is unsure of appropriate hypoglycemia management  Pharmacist Clinical Goal(s):  Marland Kitchen Over the next 30 days, patient and his caregiver team will demonstrate improved understanding of hypoglycemia management as evidenced by appropriate treatment . Over the next 30 days, patient and his caregiver team will demonstrate improved understanding of hypoglycemia surveillance by writing down blood sugars in the provided book  Interventions: . Comprehensive medication review performed.  . Counseled on signs/symptoms and management of hypoglycemia  Patient Self Care Activities:  . Currently UNABLE TO independently recognize and treat hypoglycemia  Plan: . PharmD will ensure that glipizide was discontinued and is not in the patient Pill Packs  Initial goal documentation     . "We aren't sure what medications he's taking" (pt-stated)       Current Barriers:  Marland Kitchen Knowledge Deficits related to medication management - patient is unable to verbalize his medications, nor is his caregiver, [name]; however, he does receive medictions through [facility] at [facility]  Pharmacist Clinical Goal(s):  Marland Kitchen Over the next 30 days, patient will demonstrate improved understanding of prescribed medications and rationale for usage as evidenced by knowledge of use of each medication  Interventions: . Comprehensive medication review performed. Marland Kitchen Collaboration with [facility]  community pharmacy  to ensure Pill Packs match prescribed medications  Patient Self Care Activities:  . Currently UNABLE TO independently manage medications or self-adminster medications; caregiver, [name], administers medications every day  Plan: . PharmD willcontact [facility] to discuss medications in pill packs - phone line is down today, will follow up within 5-7 business days  Initial goal documentation        The patient verbalized understanding of instructions provided today and declined a print copy of patient instruction materials.   PharmD will contact [facility] within 5-7 business days to review Pill Pack contents.  Catie Feliz Beam, PharmD Clinical Pharmacist [facility] Practice/[facility] [phone]

## 2018-09-17 NOTE — Chronic Care Management (AMB) (Signed)
Chronic Care Management   Initial Visit Note  09/17/2018 Name: NIKOLIS GOVERNALE MRN: [id number] DOB: Mar 06, 1971  Referred by: [name], NP Reason for referral : Chronic Care Management (Initial Face to Face Visit)   ZAYLEN HIGHLAND is a 48 y.o. year old male who is a primary care patient of Cannady, Dorie Rank, NP. The CCM team was consulted for assistance with chronic disease management and care coordination needs.   Review of patient status, including review of consultants reports, relevant laboratory and other test results, and collaboration with appropriate care team members and the patient's provider was performed as part of comprehensive patient evaluation and provision of chronic care management services.    SDOH (Social Determinants of Health) screening performed today. See Care Plan Entry related to challenges with: None (LCSW consulted for collaboration)  Objective:  Lab Results  Component Value Date   HGBA1C 5.5 09/02/2018   HGBA1C 5.4 08/27/2018   HGBA1C 6.5 09/27/2015   Lab Results  Component Value Date   LDLCALC 52 09/02/2018   CREATININE 1.14 09/02/2018   BP Readings from Last 3 Encounters:  09/15/18 (!) 159/89  09/02/18 124/79  08/28/18 (!) 141/83   Goals Addressed    . "We want to keep an eye on his blood pressure" (pt-stated)       Current Barriers:  Marland Kitchen Knowledge Deficits related to basic understanding of hypertension pathophysiology and self care management . Non-adherence to scheduled provider appointments . Cognitive Deficits - chronic brain-hydrocephalus syndrome  Nurse Case Manager Clinical Goal(s):  Marland Kitchen Over the next 60 days, patient will verbalize understanding of plan for hypertension management . Over the next 90 days, patient will attend all scheduled medical appointments: PCP 12/01/18 10:15am . Over the next 90 days, patient will demonstrate improved adherence to prescribed treatment plan for  hypertension as evidenced by taking all medications as  prescribed, monitoring and recording blood  pressure as directed  Interventions:  . Evaluation of current treatment plan related to hypertension self management and patient's adherence to plan as established by provider. . Provided education to patient re: stroke prevention, s/s of heart attack and stroke, DASH diet, complications of uncontrolled blood pressure . Discussed plans with patient for ongoing care management follow up and provided patient with direct contact information for care management team . Advised patient, providing education and rationale, to monitor blood pressure daily and record, calling PCP for findings outside established parameters.  . Reviewed scheduled/upcoming provider appointments including:  PCP 12/01/18 10:15am .  Patient Self Care Activities:  . UNABLE to independently self monitor blood pressure - caregiver has purchased digital bp monitor and will being daily monitoring and recording  Initial goal documentation      . "We want to take care of his diabetes" (caregiver)  (pt-stated)       Current Barriers:  Marland Kitchen Knowledge Deficits related to basic Diabetes pathophysiology and self care/management . Does not use cbg meter   Nurse Case Manager Clinical Goal(s):  Over the next 30 days, patient will demonstrate improved adherence to prescribed treatment plan for diabetes self care/management as evidenced by:  . daily monitoring and recording of CBG - caregiver recently obtained cbg monitor and checked for the first time on 3/11 . adherence to ADA/ carb modified diet . adherence to prescribed medication regimen  Interventions:  . Provided education to patient about basic DM disease process . Discussed plans with patient for ongoing care management follow up and provided patient with direct contact information  for care management team . Provided patient with written educational materials related to hypo and hyperglycemia and importance of correct treatment .  Reviewed scheduled/upcoming provider appointments including: [facility] 09/30/18 10am for fitting for balance brace and diabetic shoes . Advised patient, providing education and rationale, to check cbg twice daily and record, calling provider office or nurse case manager or pharmacist for findings outside established parameters.    Patient Self Care Activities:  . UNABLE to independently monitor cbg's, prepare daily meals, manage health conditions  Initial goal documentation      The CM team will reach out to the patient again over the next 14 days.    Marja Kays MHA,BSN,RN,CCM Nurse Care Coordinator [facility] Care Management [phone]

## 2018-09-17 NOTE — Patient Instructions (Signed)
Visit Information  Goals Addressed            This Visit's Progress   . "We want to keep an eye on his blood pressure" (pt-stated)       Current Barriers:  Marland Kitchen Knowledge Deficits related to basic understanding of hypertension pathophysiology and self care management . Non-adherence to scheduled provider appointments . Cognitive Deficits - chronic brain-hydrocephalus syndrome  Nurse Case Manager Clinical Goal(s):  Marland Kitchen Over the next 60 days, patient will verbalize understanding of plan for hypertension management . Over the next 90 days, patient will attend all scheduled medical appointments: PCP 12/01/18 10:15am . Over the next 90 days, patient will demonstrate improved adherence to prescribed treatment plan for  hypertension as evidenced by taking all medications as prescribed, monitoring and recording blood  pressure as directed  Interventions:  . Evaluation of current treatment plan related to hypertension self management and patient's adherence to plan as established by provider. . Provided education to patient re: stroke prevention, s/s of heart attack and stroke, DASH diet, complications of uncontrolled blood pressure . Discussed plans with patient for ongoing care management follow up and provided patient with direct contact information for care management team . Advised patient, providing education and rationale, to monitor blood pressure daily and record, calling PCP for findings outside established parameters.  . Reviewed scheduled/upcoming provider appointments including:  PCP 12/01/18 10:15am .  Patient Self Care Activities:  . UNABLE to independently self monitor blood pressure - caregiver has purchased digital bp monitor and will being daily monitoring and recording  Initial goal documentation      . "We want to take care of his diabetes" (caregiver)  (pt-stated)       Current Barriers:  Marland Kitchen Knowledge Deficits related to basic Diabetes pathophysiology and self  care/management . Does not use cbg meter   Nurse Case Manager Clinical Goal(s):  Over the next 30 days, patient will demonstrate improved adherence to prescribed treatment plan for diabetes self care/management as evidenced by:  . daily monitoring and recording of CBG - caregiver recently obtained cbg monitor and checked for the first time on 3/11 . adherence to ADA/ carb modified diet . adherence to prescribed medication regimen  Interventions:  . Provided education to patient about basic DM disease process . Discussed plans with patient for ongoing care management follow up and provided patient with direct contact information for care management team . Provided patient with written educational materials related to hypo and hyperglycemia and importance of correct treatment . Reviewed scheduled/upcoming provider appointments including: [place] 09/30/18 10am for fitting for balance brace and diabetic shoes . Advised patient, providing education and rationale, to check cbg twice daily and record, calling provider office or nurse case manager or pharmacist for findings outside established parameters.    Patient Self Care Activities:  . UNABLE to independently monitor cbg's, prepare daily meals, manage health conditions  Initial goal documentation         Mr. Lyford was given information about Chronic Care Management services today including:  1. CCM service includes personalized support from designated clinical staff supervised by his physician, including individualized plan of care and coordination with other care providers 2. 24/7 contact phone numbers for assistance for urgent and routine care needs. 3. Service will only be billed when office clinical staff spend 20 minutes or more in a month to coordinate care. 4. Only one practitioner may furnish and bill the service in a calendar month. 5. The  patient may stop CCM services at any time (effective at the end of the  month) by phone call to the office staff. 6. The patient will be responsible for cost sharing (co-pay) of up to 20% of the service fee (after annual deductible is met).  Patient agreed to services and verbal consent obtained.   The patient verbalized understanding of instructions provided today and declined a print copy of patient instruction materials.   The CM team will reach out to the patient again over the next 14 days.   Janalyn Shy MHA,BSN,RN,CCM Nurse Care Coordinator [facility] Care Management [phone]

## 2018-09-18 ENCOUNTER — Telehealth: Payer: Self-pay

## 2018-09-21 ENCOUNTER — Ambulatory Visit: Payer: Self-pay | Admitting: Pharmacist

## 2018-09-21 NOTE — Chronic Care Management (AMB) (Signed)
  Chronic Care Management   Follow Up Note   09/21/2018 Name: ALASTAIR FORSHEE MRN: [id number] DOB: 1970/09/09  Referred by: [name], NP Reason for referral : Chronic Care Management (Medication Management)   BLUE ISENHART is a 48 y.o. year old male who is a primary care patient of Cannady, Dorie Rank, NP. The CCM team was consulted for assistance with chronic disease management and care coordination needs.    Contacted [facility] to discuss Pill Packs.  Review of patient status, including review of consultants reports, relevant laboratory and other test results, and collaboration with appropriate care team members and the patient's provider was performed as part of comprehensive patient evaluation and provision of chronic care management services.    Goals Addressed            This Visit's Progress     Patient Stated   . "We aren't sure what medications he's taking" (pt-stated)       Current Barriers:  Marland Kitchen Knowledge Deficits related to medication management - patient is unable to verbalize his medications, nor is his caregiver, [name]; however, he does receive medictions through [facility] Packs at [facility]  Pharmacist Clinical Goal(s):  Marland Kitchen Over the next 30 days, patient will demonstrate improved understanding of prescribed medications and rationale for usage as evidenced by knowledge of use of each medication  Interventions: . Comprehensive medication review performed. Marland Kitchen Collaboration with [facility]  community pharmacy- They were still filling glipizide XL 10 mg daily. Asked that they discontinue this order. They were filling with [facility] [phone]; will use this to help describe pill to remove to [facility], patient's caregiver who helps manage medications.  . All other medications are filled accurately as compared to the electronic medical record  Patient Self Care Activities:  . Currently UNABLE TO independently manage medications or self-adminster medications; caregiver,  [name], administers medications every day  Plan: . PharmD contacted patient's peer support worker, [name], for [name]'s (patient's caregiver) contact information. Will help describe which pill to remove from the pill packs to [facility].   Please see past updates related to this goal by clicking on the "Past Updates" button in the selected goal          Plan - If I have not heard back from patient's peer support worker by the end of business today, will outreach again tomorrow.   Catie Feliz Beam, PharmD Clinical Pharmacist [facility] [phone]

## 2018-09-21 NOTE — Patient Instructions (Addendum)
Visit Information  Goals Addressed            This Visit's Progress     Patient Stated   . "We aren't sure what medications he's taking" (pt-stated)       Current Barriers:  Marland Kitchen Knowledge Deficits related to medication management - patient is unable to verbalize his medications, nor is his caregiver, [name]; however, he does receive medictions through [facility] at [facility]  Pharmacist Clinical Goal(s):  Marland Kitchen Over the next 30 days, patient will demonstrate improved understanding of prescribed medications and rationale for usage as evidenced by knowledge of use of each medication  Interventions: . Comprehensive medication review performed. Marland Kitchen Collaboration with [facility]  community pharmacy- They were still filling glipizide XL 10 mg daily. Asked that they discontinue this order. They were filling with [facility] [phone]; directed patient's peer support counselor, [name], to search the [facility] to see what the glipizide looks like, and to remove this from his pill packs.  . All other medications are filled accurately as compared to the electronic medical record  Patient Self Care Activities:  . Currently UNABLE TO independently manage medications or self-adminster medications; caregiver, [name], administers medications every day  Plan: . [name] will help direct the patient/[name] to remove the glipizide from the pill packs each morning until they complete this set of packs.   Please see past updates related to this goal by clicking on the "Past Updates" button in the selected goal         The patient verbalized understanding of instructions provided today and declined a print copy of patient instruction materials.   Plan - CM team will outreach patient within the next 7 days.  Catie Feliz Beam, PharmD Clinical Pharmacist [facility] [phone]

## 2018-09-21 NOTE — Chronic Care Management (AMB) (Signed)
  Chronic Care Management   Follow Up Note   09/21/2018 Name: ABIE GUSTAFSSON MRN: [id number] DOB: [birth year]  Referred by: [name], NP Reason for referral : Chronic Care Management (Medication Management)   MONG FUSON is a 48 y.o. year old male who is a primary care patient of Cannady, Dorie Rank, NP. The CCM team was consulted for assistance with chronic disease management and care coordination needs.    Review of patient status, including review of consultants reports, relevant laboratory and other test results, and collaboration with appropriate care team members and the patient's provider was performed as part of comprehensive patient evaluation and provision of chronic care management services.    Goals Addressed            This Visit's Progress     Patient Stated   . "His blood sugar drops" (pt-stated)       Current Barriers:  Marland Kitchen Knowledge Deficits related to hypoglycemia - patient has hypoglycemia unawareness; patient's caregiver, [name], is unsure of appropriate hypoglycemia management  . Knowledge deficits related to how to interpret blood sugars - [name] called to report concerns of a blood sugar of 215 immediately following the patient eating  Pharmacist Clinical Goal(s):  Marland Kitchen Over the next 30 days, patient and his caregiver team will demonstrate improved understanding of hypoglycemia management as evidenced by appropriate treatment . Over the next 30 days, patient and his caregiver team will demonstrate improved understanding of hypoglycemia surveillance by writing down blood sugars in the provided book  Interventions: . Comprehensive medication review performed.  . Educated on goal fasting (<130) and goal 2 hour post prandial (<180) blood sugars. Educated to check ~2 hours after a meal.  . Reiterated removing glipizide from the patient's pill packs to prevent hypoglycemia. [name] was able to verbalize what the glipizide tablet looks like.   Patient Self Care  Activities:  . Currently UNABLE TO independently recognize and treat hypoglycemia  Plan: . Patient will continue to check BG daily to evaluate discontinuation of glipizide therapy  . RN CM will contact patient later this week to follow up  Please see past updates related to this goal by clicking on the "Past Updates" button in the selected goal          Plan - RN CM will follow up with patient/[name] later this week regarding BG and BP checks.   Catie Feliz Beam, PharmD Clinical Pharmacist [facility] [phone]

## 2018-09-21 NOTE — Chronic Care Management (AMB) (Addendum)
  Chronic Care Management   Follow Up Note   09/21/2018 Name: [name] MRN: [id number] DOB: [birth year]  Referred by: [name], NP Reason for referral : Chronic Care Management (Medication Management)   [name] is a 48 y.o. year old male who is a primary care patient of Cannady, Dorie Rank, NP. The CCM team was consulted for assistance with chronic disease management and care coordination needs.    Review of patient status, including review of consultants reports, relevant laboratory and other test results, and collaboration with appropriate care team members and the patient's provider was performed as part of comprehensive patient evaluation and provision of chronic care management services.    Goals Addressed            This Visit's Progress     Patient Stated   . "We aren't sure what medications he's taking" (pt-stated)       Current Barriers:  Marland Kitchen Knowledge Deficits related to medication management - patient is unable to verbalize his medications, nor is his caregiver, [name]; however, he does receive medictions through Pill Packs at [facility]  Pharmacist Clinical Goal(s):  Marland Kitchen Over the next 30 days, patient will demonstrate improved understanding of prescribed medications and rationale for usage as evidenced by knowledge of use of each medication  Interventions: . Comprehensive medication review performed. Marland Kitchen Collaboration with [facility]  community pharmacy- They were still filling glipizide XL 10 mg daily. Asked that they discontinue this order. They were filling with [facility] [phone]; directed patient's peer support counselor, [name], to search the [facility] to see what the glipizide looks like, and to remove this from his pill packs.  . All other medications are filled accurately as compared to the electronic medical record  Patient Self Care Activities:  . Currently UNABLE TO independently manage medications or self-adminster medications; caregiver, [name],  administers medications every day  Plan: . [name] will help direct the patient/[name] to remove the glipizide from the pill packs each morning until they complete this set of packs.   Please see past updates related to this goal by clicking on the "Past Updates" button in the selected goal          Plan - CCM team member will outreach patient in the next 7 days to review medications, blood sugars.   Catie Feliz Beam, PharmD Clinical Pharmacist [facility] Practice/[facility] [phone]

## 2018-09-22 ENCOUNTER — Telehealth: Payer: Self-pay

## 2018-09-23 ENCOUNTER — Ambulatory Visit: Payer: Medicare Other | Admitting: Pharmacist

## 2018-09-23 DIAGNOSIS — IMO0002 Reserved for concepts with insufficient information to code with codable children: Secondary | ICD-10-CM

## 2018-09-23 DIAGNOSIS — E1142 Type 2 diabetes mellitus with diabetic polyneuropathy: Secondary | ICD-10-CM

## 2018-09-23 DIAGNOSIS — E1165 Type 2 diabetes mellitus with hyperglycemia: Principal | ICD-10-CM

## 2018-09-23 NOTE — Patient Instructions (Signed)
Visit Information  Goals Addressed            This Visit's Progress     Patient Stated   . COMPLETED: "We aren't sure what medications he's taking" (pt-stated)       Current Barriers:  Marland Kitchen Knowledge Deficits related to medication management - patient is unable to verbalize his medications, nor is his caregiver, [name]; however, he does receive medictions through Pill Packs at [employer]  . [name] having a hard time identifying which pill in the pack was glipizide.  Pharmacist Clinical Goal(s):  Marland Kitchen Over the next 30 days, patient will demonstrate improved understanding of prescribed medications and rationale for usage as evidenced by knowledge of use of each medication  Interventions: . Comprehensive medication review performed. Showed [name] which pill was glipizide, she expressed understanding to remove it each morning for the next ~2 weeks that patient has this set of packs . [name] plans to go by [employer] [facility] to see if they could open and remove glipizide and repackage this set of packs for the patient  Patient Self Care Activities:  . Currently UNABLE TO independently manage medications or self-adminster medications; caregiver, [name], administers medications every day  Plan: . [name] will see if [facility] could remove glipizide and repackage; if not, she will remove from the packs herself each morning   Please see past updates related to this goal by clicking on the "Past Updates" button in the selected goal         The patient verbalized understanding of instructions provided today and declined a print copy of patient instruction materials.   Plan - CCM RN will outreach the patient tomorrow to follow up on diabetic shoes/brace  Catie Feliz Beam, PharmD Clinical Pharmacist [facility] Practice[facility] [phone]

## 2018-09-23 NOTE — Chronic Care Management (AMB) (Signed)
  Chronic Care Management   Follow Up Note   09/23/2018 Name: [name] MRN: [id number] DOB: 24-Jun-1971  Referred by: [name], NP Reason for referral : Chronic Care Management (Medication Management)   [name] is a 48 y.o. year old male who is a primary care patient of Cannady, Dorie Rank, NP. The CCM team was consulted for assistance with chronic disease management and care coordination needs.    Patient's caregiver, [name], brought Pill Packs by the office for me to help show her which pill is glipizide for it to be removed until next month's packs are filled  Review of patient status, including review of consultants reports, relevant laboratory and other test results, and collaboration with appropriate care team members and the patient's provider was performed as part of comprehensive patient evaluation and provision of chronic care management services.    Goals Addressed            This Visit's Progress     Patient Stated   . COMPLETED: "We aren't sure what medications he's taking" (pt-stated)       Current Barriers:  Marland Kitchen Knowledge Deficits related to medication management - patient is unable to verbalize his medications, nor is his caregiver, [name]; however, he does receive medictions through Pill Packs at [employer]  . [name] having a hard time identifying which pill in the pack was glipizide.  Pharmacist Clinical Goal(s):  Marland Kitchen Over the next 30 days, patient will demonstrate improved understanding of prescribed medications and rationale for usage as evidenced by knowledge of use of each medication  Interventions: . Comprehensive medication review performed. Showed [name] which pill was glipizide, she expressed understanding to remove it each morning for the next ~2 weeks that patient has this set of packs . [name] plans to go by [employer] [facility] to see if they could open and remove glipizide and repackage this set of packs for the patient  Patient Self Care  Activities:  . Currently UNABLE TO independently manage medications or self-adminster medications; caregiver, [name], administers medications every day  Plan: . [name] will see if [facility] could remove glipizide and repackage; if not, she will remove from the packs herself each morning   Please see past updates related to this goal by clicking on the "Past Updates" button in the selected goal         Plan - CCM RN will outreach the patient tomorrow to follow up on diabetic shoes/brace  Catie Feliz Beam, PharmD Clinical Pharmacist [facility] Practice/[facility] [phone]

## 2018-09-24 ENCOUNTER — Ambulatory Visit: Payer: Medicare Other | Admitting: Licensed Clinical Social Worker

## 2018-09-24 ENCOUNTER — Ambulatory Visit: Payer: Medicare Other | Admitting: *Deleted

## 2018-09-24 ENCOUNTER — Other Ambulatory Visit: Payer: Self-pay

## 2018-09-24 DIAGNOSIS — I10 Essential (primary) hypertension: Secondary | ICD-10-CM

## 2018-09-24 DIAGNOSIS — IMO0002 Reserved for concepts with insufficient information to code with codable children: Secondary | ICD-10-CM

## 2018-09-24 DIAGNOSIS — E1165 Type 2 diabetes mellitus with hyperglycemia: Principal | ICD-10-CM

## 2018-09-24 DIAGNOSIS — E1142 Type 2 diabetes mellitus with diabetic polyneuropathy: Secondary | ICD-10-CM

## 2018-09-24 NOTE — Patient Instructions (Signed)
Visit Information  Goals Addressed            This Visit's Progress   . "He gets constipated"  (pt-stated)       Current Barriers:  Marland Kitchen Knowledge Deficits related to how to address issues with constipation.  Nurse Case Manager Clinical Goal(s):  Marland Kitchen Over the next 30 days, patient will verbalize understanding of plan for decreased constipation.  . Over the next 30  days, patient will work with [name]/Caregiver to address needs related to constipation . Over the next 30 days, patient will demonstrate a decrease in constipation as evidenced by conversation with caregiver to evaluate decrease in patient's symptoms.   Interventions:  . Advised patient to increase water, vegetables and fiber intake.  Marland Kitchen Provided education to patient re: ways to decrease constipation. [name] with CCM pharmacist  regarding who advised caregiver to add colace to patient's medications when patient having these symptoms.   Patient Self Care Activities:  . Currently UNABLE TO independently manage medications or diet related to constipation  Initial goal documentation      . "His blood sugar drops" (pt-stated)       Current Barriers:  Marland Kitchen Knowledge Deficits related to hypoglycemia - patient has hypoglycemia unawareness; patient's caregiver, [name], is unsure of appropriate hypoglycemia management  . Knowledge deficits related to how to interpret blood sugars - [name] called to report concerns of a blood sugar of 215 immediately following the patient eating . Spoke with [name]- Patient's sugar WNL since the weekend, fasting CBG in 100-106.   Pharmacist Clinical Goal(s):  Marland Kitchen Over the next 30 days, patient and his caregiver team will demonstrate improved understanding of hypoglycemia management as evidenced by appropriate treatment . Over the next 30 days, patient and his caregiver team will demonstrate improved understanding of hypoglycemia surveillance by writing down blood sugars in the provided  book  Interventions: . Comprehensive medication review performed.  . Educated on goal fasting (<130) and goal 2 hour post prandial (<180) blood sugars. Educated to check ~2 hours after a meal.  . Reiterated removing glipizide from the patient's pill packs to prevent hypoglycemia. [name] was able to verbalize what the glipizide tablet looks like.   Patient Self Care Activities:  . Currently UNABLE TO independently recognize and treat hypoglycemia  Plan: . Patient will continue to check BG daily to evaluate discontinuation of glipizide therapy  . RN CM will contact patient later this week to follow up- [name] reports she has removed the glipizide from pill packs after pharmacist able to assist her in identifying the correct medication. Reports pharmacy will remove it from future pill packs.  Please see past updates related to this goal by clicking on the "Past Updates" button in the selected goal      . "We want to keep an eye on his blood pressure" (pt-stated)       Current Barriers:  Marland Kitchen Knowledge Deficits related to basic understanding of hypertension pathophysiology and self care management . Non-adherence to scheduled provider appointments . Cognitive Deficits - chronic brain-hydrocephalus syndrome . Does not have the correct equipment- [name] reports b/p cuff provided by [name] was the incorrect size but in the next couple of days he will provide the correct size for monitoring.  Nurse Case Manager Clinical Goal(s):  Marland Kitchen Over the next 60 days, patient will verbalize understanding of plan for hypertension management . Over the next 90 days, patient will attend all scheduled medical appointments: PCP 12/01/18 10:15am . Over the next 90  days, patient will demonstrate improved adherence to prescribed treatment plan for  hypertension as evidenced by taking all medications as prescribed, monitoring and recording blood  pressure as directed  Interventions:  . Evaluation of current treatment plan  related to hypertension self management and patient's adherence to plan as established by provider. . Provided education to patient re: stroke prevention, s/s of heart attack and stroke, DASH diet, complications of uncontrolled blood pressure . Discussed plans with patient for ongoing care management follow up and provided patient with direct contact information for care management team . Advised patient, providing education and rationale, to monitor blood pressure daily and record, calling PCP for findings outside established parameters.-[name] to obtain correct size cuff and begin monitoring and begin recording . Reviewed scheduled/upcoming provider appointments including:  PCP 12/01/18 10:15am .  Patient Self Care Activities:  . UNABLE to independently self monitor blood pressure - caregiver has purchased digital bp monitor and will being daily monitoring and recording  Please see past updates related to this goal by clicking on the "Past Updates" button in the selected goal          The patient verbalized understanding of instructions provided today and declined a print copy of patient instruction materials.   Telephone follow up appointment with CCM team member scheduled for:14 days [name] RN, BSN Nurse Case Manager [facility]  [phone]) Business [employer]

## 2018-09-24 NOTE — Patient Instructions (Signed)
Licensed Clinical Social Worker Visit Information    The patient verbalized understanding of instructions provided today and declined a print copy of patient Paramedic.   Follow up plan: Client will contact LCSW if any future social work needs arise   Dickie La, BSW, MSW, LCSW [facility] [place]  [facility] [email or web address] Phone: [phone]

## 2018-09-24 NOTE — Chronic Care Management (AMB) (Addendum)
  Chronic Care Management    Clinical Social Work CCM Outreach Note  09/24/2018   Name: [name] MRN: [id number] DOB: Aug 21, 1970  [name] is a 48 y.o. year old male who is a primary care patient of Cannady, Dorie Rank, NP . The CCM team was consulted for assistance. LCSW reached out to [name] today and was able to reach him successfully. HIPPA verifications received successfully. Patient reports that he is doing well and feels that his recent medication change has so far been a positive one and has been working out well for him. Patient denies any current social work needs at this time but is agreeable to ongoing CCM contact for care coordination and disease management. LCSW spoke with CCM RNCM and discussed case/coordinated care. RNCM spoke with patient's caregiver today as well.   Follow Up Plan: Client will save LCSW's contact information and reach out to LCSW if any future social work needs arise.   [name], BSW, MSW, LCSW [facility] [place]  [facility] [name] Phone: [phone]

## 2018-09-24 NOTE — Chronic Care Management (AMB) (Signed)
Chronic Care Management   Follow Up Note   09/24/2018 Name: [name] MRN: [id number] DOB: 08-13-1970  Referred by: Patrick Skiff, NP Reason for referral : Chronic Care Management (telephone f/u)   [name] is a 48 y.o. year old male who is a primary care patient of Cannady, Patrick Rank, NP. The CCM team was consulted for assistance with chronic disease management and care coordination needs.    Review of patient status, including review of consultants reports, relevant laboratory and other test results, and collaboration with appropriate care team members and the patient's provider was performed as part of comprehensive patient evaluation and provision of chronic care management services.   Placed a call to patient's care giver [name] to discuss blood sugars, and  blood pressure monitoring. [name] expressed patient has been struggling with constipation.   Goals Addressed    . "He gets constipated"  (pt-stated)       Current Barriers:  [name] Kitchen Knowledge Deficits related to how to address issues with constipation.  Nurse Case Manager Clinical Goal(s):  [name] Kitchen Over the next 30 days, patient will verbalize understanding of plan for decreased constipation.  . Over the next 30  days, patient will work with [name]/Caregiver to address needs related to constipation . Over the next 30 days, patient will demonstrate a decrease in constipation as evidenced by conversation with caregiver to evaluate decrease in patient's symptoms.   Interventions:  . Advised patient to increase water, vegetables and fiber intake.  [name] Kitchen Provided education to patient re: ways to decrease constipation. [name] with CCM pharmacist  regarding who advised caregiver to add colace to patient's medications when patient having these symptoms.   Patient Self Care Activities:  . Currently UNABLE TO independently manage medications or diet related to constipation  Initial goal documentation      . "His blood sugar  drops" (pt-stated)       Current Barriers:  [name] Kitchen Knowledge Deficits related to hypoglycemia - patient has hypoglycemia unawareness; patient's caregiver, [name], is unsure of appropriate hypoglycemia management  . Knowledge deficits related to how to interpret blood sugars - [name] called to report concerns of a blood sugar of 215 immediately following the patient eating . Spoke with [name]- Patient's sugar WNL since the weekend, fasting CBG in 100-106.   Pharmacist Clinical Goal(s):  [name] Kitchen Over the next 30 days, patient and his caregiver team will demonstrate improved understanding of hypoglycemia management as evidenced by appropriate treatment . Over the next 30 days, patient and his caregiver team will demonstrate improved understanding of hypoglycemia surveillance by writing down blood sugars in the provided book  Interventions: . Comprehensive medication review performed.  . Educated on goal fasting (<130) and goal 2 hour post prandial (<180) blood sugars. Educated to check ~2 hours after a meal.  . Reiterated removing glipizide from the patient's pill packs to prevent hypoglycemia. [name] was able to verbalize what the glipizide tablet looks like.   Patient Self Care Activities:  . Currently UNABLE TO independently recognize and treat hypoglycemia  Plan: . Patient will continue to check BG daily to evaluate discontinuation of glipizide therapy  . RN CM will contact patient later this week to follow up- [name] reports she has removed the glipizide from pill packs after pharmacist able to assist her in identifying the correct medication. Reports pharmacy will remove it from future pill packs.  Please see past updates related to this goal by clicking on the "Past Updates" button in the selected  goal      . "We want to keep an eye on his blood pressure" (pt-stated)       Current Barriers:  [name] Kitchen Knowledge Deficits related to basic understanding of hypertension pathophysiology and self care management .  Non-adherence to scheduled provider appointments . Cognitive Deficits - chronic brain-hydrocephalus syndrome . Does not have the correct equipment- [name] reports b/p cuff provided by [name] was the incorrect size but in the next couple of days he will provide the correct size for monitoring.  Nurse Case Manager Clinical Goal(s):  [name] Kitchen Over the next 60 days, patient will verbalize understanding of plan for hypertension management . Over the next 90 days, patient will attend all scheduled medical appointments: PCP 12/01/18 10:15am . Over the next 90 days, patient will demonstrate improved adherence to prescribed treatment plan for  hypertension as evidenced by taking all medications as prescribed, monitoring and recording blood  pressure as directed  Interventions:  . Evaluation of current treatment plan related to hypertension self management and patient's adherence to plan as established by provider. . Provided education to patient re: stroke prevention, s/s of heart attack and stroke, DASH diet, complications of uncontrolled blood pressure . Discussed plans with patient for ongoing care management follow up and provided patient with direct contact information for care management team . Advised patient, providing education and rationale, to monitor blood pressure daily and record, calling PCP for findings outside established parameters.-[name] to obtain correct size cuff and begin monitoring and begin recording . Reviewed scheduled/upcoming provider appointments including:  PCP 12/01/18 10:15am .  Patient Self Care Activities:  . UNABLE to independently self monitor blood pressure - caregiver has purchased digital bp monitor and will being daily monitoring and recording  Please see past updates related to this goal by clicking on the "Past Updates" button in the selected goal         Telephone follow up appointment with CCM team member scheduled for: 14 days.   [name] Patrick Timothy RN, BSN Nurse Case  Education officer, [facility]  [phone]) Business [employer]

## 2018-09-30 ENCOUNTER — Other Ambulatory Visit: Payer: Medicare Other | Admitting: Orthotics

## 2018-10-06 ENCOUNTER — Ambulatory Visit: Payer: Medicare Other | Admitting: Pharmacist

## 2018-10-06 DIAGNOSIS — E1142 Type 2 diabetes mellitus with diabetic polyneuropathy: Secondary | ICD-10-CM

## 2018-10-06 DIAGNOSIS — IMO0002 Reserved for concepts with insufficient information to code with codable children: Secondary | ICD-10-CM

## 2018-10-06 DIAGNOSIS — E1165 Type 2 diabetes mellitus with hyperglycemia: Principal | ICD-10-CM

## 2018-10-06 NOTE — Chronic Care Management (AMB) (Signed)
  Chronic Care Management   Follow Up Note   10/06/2018 Name: ANGIE FAVRET MRN: [id number] DOB: [birth year]  Referred by: [name], NP Reason for referral : Chronic Care Management (Diabetes)   KORVER GALDI is a 48 y.o. year old male who is a primary care patient of Cannady, Dorie Rank, NP. The CCM team was consulted for assistance with chronic disease management and care coordination needs.    Contacted patient's caregiver, [name], to discuss blood sugar control since glipizide has been removed from pill packs  Review of patient status, including review of consultants reports, relevant laboratory and other test results, and collaboration with appropriate care team members and the patient's provider was performed as part of comprehensive patient evaluation and provision of chronic care management services.    Goals Addressed            This Visit's Progress     Patient Stated   . COMPLETED: "His blood sugar drops" (pt-stated)       Current Barriers:   Patient had history of hypoglycemia; glipizide was discontinued, but the pharmacy was unaware and continued to place glipizide in pill packages. Pharmacy has now been notified and patient's caregiver, [name], is aware of which medication is glipizide to remove from each morning pill pack  [name] reports improvement in fasting blood sugar since glipizide was discontinued on 09/23/2018:   Fasting Blood Glucose   19-Mar 127  20-Mar 89  21-Mar 91  22-Mar 138  23-Mar 128  27-Mar 95  28-Mar 107  29-Mar 120  30-Mar 165  31-Mar 145  Average 121   Pharmacist Clinical Goal(s):  Marland Kitchen Over the next 30 days, patient and his caregiver team will demonstrate improved understanding of hypoglycemia surveillance by writing down blood sugars in the provided book  Interventions: . Educated patient's caregiver, [name], on goal fasting blood sugar, and that patient's average fasting blood sugar is at this goal <130  . Encouraged [name] to  continue to check blood sugars and report any patterns of recurrent hypoglycemia OR if fastings begin to consistently be >130  Patient Self Care Activities:  . Self administers medications as prescribed  . Checks fasting blood glucose daily   Please see past updates related to this goal by clicking on the "Past Updates" button in the selected goal          Plan: - PharmD will follow up with patient's caregiver on blood sugars and blood pressure readings within the next 30 days  Catie Feliz Beam, PharmD Clinical Pharmacist [facility] [phone]

## 2018-10-06 NOTE — Patient Instructions (Signed)
Visit Information  Goals Addressed            This Visit's Progress     Patient Stated   . COMPLETED: "His blood sugar drops" (pt-stated)       Current Barriers:   Patient had history of hypoglycemia; glipizide was discontinued, but the pharmacy was unaware and continued to place glipizide in pill packages. Pharmacy has now been notified and patient's caregiver, [name], is aware of which medication is glipizide to remove from each morning pill pack  [name] reports improvement in fasting blood sugar since glipizide was discontinued on 09/23/2018:   Fasting Blood Glucose   19-Mar 127  20-Mar 89  21-Mar 91  22-Mar 138  23-Mar 128  27-Mar 95  28-Mar 107  29-Mar 120  30-Mar 165  31-Mar 145  Average 121   Pharmacist Clinical Goal(s):  Marland Kitchen Over the next 30 days, patient and his caregiver team will demonstrate improved understanding of hypoglycemia surveillance by writing down blood sugars in the provided book  Interventions: . Educated patient's caregiver, [name], on goal fasting blood sugar, and that patient's average fasting blood sugar is at this goal <130  . Encouraged [name] to continue to check blood sugars and report any patterns of recurrent hypoglycemia OR if fastings begin to consistently be >130  Patient Self Care Activities:  . Self administers medications as prescribed  . Checks fasting blood glucose daily   Please see past updates related to this goal by clicking on the "Past Updates" button in the selected goal         The patient verbalized understanding of instructions provided today and declined a print copy of patient instruction materials.   Plan: - PharmD will follow up with patient's caregiver on blood sugars and blood pressure readings within the next 30 days  Catie Feliz Beam, PharmD Clinical Pharmacist [facility] [phone]

## 2018-10-09 ENCOUNTER — Telehealth: Payer: Self-pay

## 2018-10-09 NOTE — Telephone Encounter (Signed)
Copied from CRM [id number]. Topic: General - Other >> Oct 09, 2018 10:29 AM Wyonia Hough E wrote: Reason for CRM: [facility] called and stated that they keep getting request for Pts visit but the Pt never came in for his appt and the last time he was seen was in 2018.

## 2018-10-28 ENCOUNTER — Ambulatory Visit: Payer: Medicare Other | Admitting: *Deleted

## 2018-10-28 DIAGNOSIS — E1142 Type 2 diabetes mellitus with diabetic polyneuropathy: Secondary | ICD-10-CM

## 2018-10-28 DIAGNOSIS — IMO0002 Reserved for concepts with insufficient information to code with codable children: Secondary | ICD-10-CM

## 2018-10-28 DIAGNOSIS — E1165 Type 2 diabetes mellitus with hyperglycemia: Principal | ICD-10-CM

## 2018-10-28 DIAGNOSIS — I10 Essential (primary) hypertension: Secondary | ICD-10-CM

## 2018-10-28 NOTE — Chronic Care Management (AMB) (Signed)
Chronic Care Management   Follow Up Note   10/28/2018 Name: [name] MRN: [id number] DOB: [birth year]  Referred by: Patrick Skiff, NP Reason for referral : Chronic Care Management (F/U on DM2)   [name] is a 48 y.o. year old male who is a primary care patient of Cannady, Patrick Rank, NP. The CCM team was consulted for assistance with chronic disease management and care coordination needs.    Placed a phone call to patient. Patient stated his previous CNA [name] was not caring for him any longer. Patient stated he was unsure of the name of the new caregiver. Patient called out for his brother [name] to ask if he new the caregiver's name. Brother was on the phone with another person and told me he would need to call me back.  Patient's advocate [name] called back and explained there was a new caregiver, he was unsure her name or contact info, but stated she was from the agency "[employer] [name]".  [name] stated the new caregiver was not checking patient's CBG or B/P. Requested [name] call RNCM back with contact information of new caregiver to provide teaching on patient's care.    Review of patient status, including review of consultants reports, relevant laboratory and other test results, and collaboration with appropriate care team members and the patient's provider was performed as part of comprehensive patient evaluation and provision of chronic care management services.    Goals Addressed            This Visit's Progress   . "We want to keep an eye on his blood pressure" (pt-stated)       Current Barriers:  Marland Kitchen Knowledge Deficits related to basic understanding of hypertension pathophysiology and self care management . Non-adherence to scheduled provider appointments . Cognitive Deficits - chronic brain-hydrocephalus syndrome . Does not have the correct equipment- new care giver not monitoring.  Nurse Case Manager Clinical Goal(s):  Marland Kitchen Over the next 60 days, patient  will verbalize understanding of plan for hypertension management . Over the next 90 days, patient will attend all scheduled medical appointments: PCP 12/01/18 10:15am . Over the next 90 days, patient will demonstrate improved adherence to prescribed treatment plan for  hypertension as evidenced by taking all medications as prescribed, monitoring and recording blood  pressure as directed  Interventions:  . Evaluation of current treatment plan related to hypertension self management and patient's adherence to plan as established by provider. . Provided education to patient re: stroke prevention, s/s of heart attack and stroke, DASH diet, complications of uncontrolled blood pressure . Discussed plans with patient for ongoing care management follow up and provided patient with direct contact information for care management team . Advised patient, providing education and rationale, to monitor blood pressure daily and record, calling PCP for findings outside established parameters.-New care giver to be taught to begin monitoring and begin recording . Reviewed scheduled/upcoming provider appointments including:  PCP 12/01/18 10:15am .  Patient Self Care Activities:  . UNABLE to independently self monitor blood pressure - caregiver has purchased digital bp monitor and will being daily monitoring and recording  Please see past updates related to this goal by clicking on the "Past Updates" button in the selected goal       . "We want to take care of his diabetes" (caregiver)  (pt-stated)       Current Barriers:  Marland Kitchen Knowledge Deficits related to basic Diabetes pathophysiology and self care/management . Does not  use cbg meter   Nurse Case Manager Clinical Goal(s):  Over the next 30 days, patient will demonstrate improved adherence to prescribed treatment plan for diabetes self care/management as evidenced by:  . daily monitoring and recording of CBG - previous care giver no longer with patient, new  caregiver not checking sugars. Marland Kitchen adherence to ADA/ carb modified diet . adherence to prescribed medication regimen  Interventions:  . Provided education to patient about basic DM disease process . Discussed plans with patient for ongoing care management follow up and provided patient with direct contact information for care management team . Provided patient with written educational materials related to hypo and hyperglycemia and importance of correct treatment . Reviewed scheduled/upcoming provider appointments including: [facility] 09/30/18 10am for fitting for balance brace and diabetic shoes-cancelled related to COVID19 . Advised patient's advocate [name], providing education and rationale, to check cbg twice daily and record, calling provider office or nurse case manager or pharmacist for findings outside established parameters.-  . Discussed patient's care with [name] patient's advocate the need to educate the new caregiver about the patient's care needs surrounding DM. Marland Kitchen Requested advocate [name] find out new caregiver's name and info so RN CM could contact her . Discussed with advocate [name] patient's need to obtain eye doctor appointment  Patient Self Care Activities:  . UNABLE to independently monitor cbg's, prepare daily meals, manage health conditions  Initial goal documentation          The CM team will reach out to the patient again over the next 7 days.   [name] Patrick Drinkard RN, BSN Nurse Case Education officer, community [facility] Practice/[facility]  [phone]) [employer]

## 2018-10-28 NOTE — Patient Instructions (Signed)
Thank you allowing the Chronic Care Management Team to be a part of your care! It was a pleasure speaking with you today!  1. Please call back with contact information for patient's new caregiver  CCM (Chronic Care Management) Team   Perle Brickhouse RN, BSN Nurse Care Coordinator  [phone]  Catie [facility] PharmD  Clinical Pharmacist  [phone]  Dickie La LCSW Clinical Social Worker [phone]  Goals Addressed            This Visit's Progress   . "We want to keep an eye on his blood pressure" (pt-stated)       Current Barriers:  Marland Kitchen Knowledge Deficits related to basic understanding of hypertension pathophysiology and self care management . Non-adherence to scheduled provider appointments . Cognitive Deficits - chronic brain-hydrocephalus syndrome . Does not have the correct equipment- new care giver not monitoring.  Nurse Case Manager Clinical Goal(s):  Marland Kitchen Over the next 60 days, patient will verbalize understanding of plan for hypertension management . Over the next 90 days, patient will attend all scheduled medical appointments: PCP 12/01/18 10:15am . Over the next 90 days, patient will demonstrate improved adherence to prescribed treatment plan for  hypertension as evidenced by taking all medications as prescribed, monitoring and recording blood  pressure as directed  Interventions:  . Evaluation of current treatment plan related to hypertension self management and patient's adherence to plan as established by provider. . Provided education to patient re: stroke prevention, s/s of heart attack and stroke, DASH diet, complications of uncontrolled blood pressure . Discussed plans with patient for ongoing care management follow up and provided patient with direct contact information for care management team . Advised patient, providing education and rationale, to monitor blood pressure daily and record, calling PCP for findings outside established parameters.-New care  giver to be taught to begin monitoring and begin recording . Reviewed scheduled/upcoming provider appointments including:  PCP 12/01/18 10:15am .  Patient Self Care Activities:  . UNABLE to independently self monitor blood pressure - caregiver has purchased digital bp monitor and will being daily monitoring and recording  Please see past updates related to this goal by clicking on the "Past Updates" button in the selected goal       . "We want to take care of his diabetes" (caregiver)  (pt-stated)       Current Barriers:  Marland Kitchen Knowledge Deficits related to basic Diabetes pathophysiology and self care/management . Does not use cbg meter   Nurse Case Manager Clinical Goal(s):  Over the next 30 days, patient will demonstrate improved adherence to prescribed treatment plan for diabetes self care/management as evidenced by:  . daily monitoring and recording of CBG - previous care giver no longer with patient, new caregiver not checking sugars. Marland Kitchen adherence to ADA/ carb modified diet . adherence to prescribed medication regimen  Interventions:  . Provided education to patient about basic DM disease process . Discussed plans with patient for ongoing care management follow up and provided patient with direct contact information for care management team . Provided patient with written educational materials related to hypo and hyperglycemia and importance of correct treatment . Reviewed scheduled/upcoming provider appointments including: [facility] 09/30/18 10am for fitting for balance brace and diabetic shoes-cancelled related to COVID19 . Advised patient's advocate [name], providing education and rationale, to check cbg twice daily and record, calling provider office or nurse case manager or pharmacist for findings outside established parameters.-  . Discussed patient's care with Clare Gandy  patient's advocate the need to educate the new caregiver about the patient's care needs  surrounding DM. Marland Kitchen Requested advocate [name] find out new caregiver's name and info so RN CM could contact her . Discussed with advocate [name] patient's need to obtain eye doctor appointment  Patient Self Care Activities:  . UNABLE to independently monitor cbg's, prepare daily meals, manage health conditions  Initial goal documentation         The patient verbalized understanding of instructions provided today and declined a print copy of patient instruction materials.   The CM team will reach out to the patient again over the next 7 days.

## 2018-11-03 ENCOUNTER — Telehealth: Payer: Self-pay

## 2018-11-04 ENCOUNTER — Ambulatory Visit (INDEPENDENT_AMBULATORY_CARE_PROVIDER_SITE_OTHER): Payer: Medicare Other | Admitting: *Deleted

## 2018-11-04 ENCOUNTER — Ambulatory Visit: Payer: Self-pay | Admitting: *Deleted

## 2018-11-04 DIAGNOSIS — I10 Essential (primary) hypertension: Secondary | ICD-10-CM

## 2018-11-04 DIAGNOSIS — E1142 Type 2 diabetes mellitus with diabetic polyneuropathy: Secondary | ICD-10-CM

## 2018-11-04 DIAGNOSIS — E1165 Type 2 diabetes mellitus with hyperglycemia: Secondary | ICD-10-CM | POA: Diagnosis not present

## 2018-11-04 DIAGNOSIS — IMO0002 Reserved for concepts with insufficient information to code with codable children: Secondary | ICD-10-CM

## 2018-11-04 NOTE — Chronic Care Management (AMB) (Signed)
  Chronic Care Management   Note  11/04/2018 Name: [name] MRN: [id number] DOB: June 27, 1971  Placed a call to [name]'s Advocate Mr. Clare Gandy, and patient's new care giver [name] with [facility],  patient of [name], NP today to discuss goals for managing their chronic condition of DM and HTN. Unable to reach caregivers at this time, was able to leave a HIPPA compliant voice mail and requested a return call.   Obtained new caregiver information from [facility] at [facility].  New Care giver: [name]- [phone]   Follow up plan: A HIPPA compliant phone message was left for the patient providing contact information and requesting a return call.  The CM team will reach out to the patient again over the next 7 days.   [name] [name] RN, BSN Nurse Case Education officer, community Family Practice/[facility] Care Management  [phone]) Business Mobile

## 2018-11-04 NOTE — Patient Instructions (Signed)
Thank you allowing the Chronic Care Management Team to be a part of your care! It was a pleasure speaking with you today!  1. Check Blood sugar in the am prior to meal 2. Check b/p once a week  CCM (Chronic Care Management) Team   Vester Titsworth RN, BSN Nurse Care Coordinator  [phone]  Catie [facility] PharmD  Clinical Pharmacist  [phone]  Dickie La LCSW Clinical Social Worker [phone]  Goals Addressed            This Visit's Progress   . "We want to take care of his diabetes" (caregiver)  (pt-stated)       Current Barriers:  Marland Kitchen Knowledge Deficits related to basic Diabetes pathophysiology and self care/management . Does not use cbg meter   Nurse Case Manager Clinical Goal(s):  Over the next 90 days, patient will demonstrate improved adherence to prescribed treatment plan for diabetes self care/management as evidenced by:  . daily monitoring and recording of CBG -Spoke with new care giver through [facility] W[place] "[name] [phone] requested she check sugars in the am before meals.  Marland Kitchen adherence to ADA/ carb modified diet- Discussed diabetic diet briefly with new care giver who provides breakfast . adherence to prescribed medication regimen  Interventions:  . Provided education to patient about basic DM disease process . Discussed plans with patient for ongoing care management follow up and provided patient with direct contact information for care management team . Provided patient with written educational materials related to hypo and hyperglycemia and importance of correct treatment . Reviewed scheduled/upcoming provider appointments including: [facility] 09/30/18 10am for fitting for balance brace and diabetic shoes-cancelled related to COVID19 . Advised patient's advocate [name], providing education and rationale, to check cbg twice daily and record, calling provider office or nurse case manager or pharmacist for findings  outside established parameters.- . Discussed with advocate [name] patient's need to obtain eye doctor appointment . Discussed with Advocate [name] about obtaining patient a Blood Pressure cuff to fit pt's arm. Marland Kitchen Spoke with new care giver through [facility] Winstead "[name] [phone] requested she check sugars in the am before meals.   Patient Self Care Activities:  . UNABLE to independently monitor cbg's, prepare daily meals, manage health conditions  Please see past updates related to this goal by clicking on the "Past Updates" button in the selected goal          The patient verbalized understanding of instructions provided today and declined a print copy of patient instruction materials.   The CM team will reach out to the patient again over the next 30 days.  The patient has been provided with contact information for the chronic care management team and has been advised to call with any health related questions or concerns.

## 2018-11-04 NOTE — Chronic Care Management (AMB) (Signed)
  Chronic Care Management   Follow Up Note   11/04/2018 Name: [name] MRN: [id number] DOB: 09-03-1970  Referred by: [name], NP Reason for referral : Care Coordination and Chronic Care Management (DM, HTN)   [name] is a 48 y.o. year old male who is a primary care patient of Cannady, Dorie Rank, NP. The CCM team was consulted for assistance with chronic disease management and care coordination needs.    Review of patient status, including review of consultants reports, relevant laboratory and other test results, and collaboration with appropriate care team members and the patient's provider was performed as part of comprehensive patient evaluation and provision of chronic care management services.   Spoke with new caregiver through [facility] "[place]" and also spoke with patient's advocate Clare Gandy.   Goals Addressed            This Visit's Progress   . "We want to take care of his diabetes" (caregiver)  (pt-stated)       Current Barriers:  Marland Kitchen Knowledge Deficits related to basic Diabetes pathophysiology and self care/management . Does not use cbg meter   Nurse Case Manager Clinical Goal(s):  Over the next 90 days, patient will demonstrate improved adherence to prescribed treatment plan for diabetes self care/management as evidenced by:  . daily monitoring and recording of CBG -Spoke with new care giver through [facility] [place] "Geraldine Contras [phone] requested she check sugars in the am before meals.  Marland Kitchen adherence to ADA/ carb modified diet- Discussed diabetic diet briefly with new care giver who provides breakfast . adherence to prescribed medication regimen  Interventions:  . Provided education to patient about basic DM disease process . Discussed plans with patient for ongoing care management follow up and provided patient with direct contact information for care management team . Provided patient with written educational  materials related to hypo and hyperglycemia and importance of correct treatment . Reviewed scheduled/upcoming provider appointments including: [facility] 09/30/18 10am for fitting for balance brace and diabetic shoes-cancelled related to COVID19 . Advised patient's advocate [name], providing education and rationale, to check cbg twice daily and record, calling provider office or nurse case manager or pharmacist for findings outside established parameters.- . Discussed with advocate [name] patient's need to obtain eye doctor appointment . Discussed with Advocate [name] about obtaining patient a Blood Pressure cuff to fit pt's arm. Marland Kitchen Spoke with new care giver through [facility] [place] "Geraldine Contras [phone] requested she check sugars in the am before meals.   Patient Self Care Activities:  . UNABLE to independently monitor cbg's, prepare daily meals, manage health conditions  Please see past updates related to this goal by clicking on the "Past Updates" button in the selected goal           The CM team will reach out to the patient again over the next 30 days.  The patient has been provided with contact information for the chronic care management team and has been advised to call with any health related questions or concerns.    SIGNATURE

## 2018-11-10 ENCOUNTER — Ambulatory Visit: Payer: Self-pay | Admitting: Pharmacist

## 2018-11-10 ENCOUNTER — Telehealth: Payer: Self-pay

## 2018-11-10 ENCOUNTER — Ambulatory Visit: Payer: Self-pay | Admitting: *Deleted

## 2018-11-10 DIAGNOSIS — IMO0002 Reserved for concepts with insufficient information to code with codable children: Secondary | ICD-10-CM

## 2018-11-10 DIAGNOSIS — E1165 Type 2 diabetes mellitus with hyperglycemia: Principal | ICD-10-CM

## 2018-11-10 DIAGNOSIS — I10 Essential (primary) hypertension: Secondary | ICD-10-CM

## 2018-11-10 DIAGNOSIS — E1142 Type 2 diabetes mellitus with diabetic polyneuropathy: Secondary | ICD-10-CM

## 2018-11-10 NOTE — Chronic Care Management (AMB) (Signed)
  Chronic Care Management   Note  11/10/2018 Name: [name] MRN: [id number] DOB: 1971/06/28  Patient is a 48 year old male followed by Aura Dials, NP for primary care services, referred to chronic care management for support with managing HTN, T2DM, hx CVA.   Contacted patient's new caregiver with [name] to discuss recent BG/BP readings. Left message. Later, received message from Janci Minor, RN that the caregiver would prefer we not contact her directly. Patient also washed his phone, so only contact is his brother, [name].   Follow up plan: - Will attempt to contact RN supervisor with [name] to discuss recommendations for checking BG/BP with patient .  Catie Feliz Beam, PharmD Clinical Pharmacist [facility] [phone]

## 2018-11-10 NOTE — Chronic Care Management (AMB) (Signed)
  Chronic Care Management   Note  11/10/2018 Name: [name] MRN: [id number] DOB: 04-Jun-1971  Received a call from patient's Home care service [facility] to provide patient's brother Jerry's  Number related to patient accidentally washed his phone and it is not working. [facility] rep also gave the number for the nurse who does the care plan for the patient Isabelle Course [phone].  Placed a call to Catie Pharm-D to make her aware of new information. Catie plans to reach out to [facility] to discuss plan of care for patient related to blood sugars and blood pressures.  Follow up plan: The CM team will reach out to the patient again over the next 7 days.   [name] Jaman Aro RN, BSN Nurse Case Education officer, community Family Practice/[facility] Care Management  [phone]) Business Mobile

## 2018-11-13 ENCOUNTER — Ambulatory Visit: Payer: Medicare Other | Admitting: Pharmacist

## 2018-11-13 DIAGNOSIS — IMO0002 Reserved for concepts with insufficient information to code with codable children: Secondary | ICD-10-CM

## 2018-11-13 DIAGNOSIS — I10 Essential (primary) hypertension: Secondary | ICD-10-CM

## 2018-11-13 DIAGNOSIS — E1142 Type 2 diabetes mellitus with diabetic polyneuropathy: Secondary | ICD-10-CM

## 2018-11-13 NOTE — Chronic Care Management (AMB) (Signed)
  Chronic Care Management   Note  11/13/2018 Name: [name] MRN: [id number] DOB: 1971/02/13  Patient is a 48 year old male followed by Aura Dials, NP for primary care services, referred to chronic care management for support with managing HTN, T2DM, hx CVA.   Attempted to contact the Consulting civil engineer for patient's new caregiver with [name] to discuss appropriate BP/BG checks and discuss the CCM program. Left HIPAA compliant message for [facility] [phone].    Catie Feliz Beam, PharmD Clinical Pharmacist [facility] [phone]

## 2018-11-17 ENCOUNTER — Telehealth: Payer: Self-pay

## 2018-11-18 ENCOUNTER — Other Ambulatory Visit: Payer: Self-pay

## 2018-11-18 MED ORDER — METFORMIN HCL ER 500 MG PO TB24: 1000.0000 mg | ORAL_TABLET | Freq: Every day | ORAL | 3 refills | Status: DC

## 2018-11-18 NOTE — Telephone Encounter (Signed)
Fax from [facility] requesting  Rx 90 day supply on Metformin ER 500 mg

## 2018-11-25 ENCOUNTER — Ambulatory Visit: Payer: Self-pay | Admitting: Licensed Clinical Social Worker

## 2018-11-25 DIAGNOSIS — E1142 Type 2 diabetes mellitus with diabetic polyneuropathy: Secondary | ICD-10-CM

## 2018-11-25 DIAGNOSIS — IMO0002 Reserved for concepts with insufficient information to code with codable children: Secondary | ICD-10-CM

## 2018-11-25 NOTE — Chronic Care Management (AMB) (Signed)
  Chronic Care Management    Clinical Social Work General Note  11/25/2018 Name: [name] MRN: [id number] DOB: 1971/03/25  [name] is a 48 y.o. year old male who is a primary care patient of Cannady, Dorie Rank, NP. The CCM was consulted to assist the patient. LCSW completed call today to patient's RN manager [facility] at [phone]. RN provided HIPPA verifications successfully and stated that patient is doing very well since gaining a new aide. She shares that patient's care support worker Clare Gandy has informed her that patient is doing well with managing his health care and mental health needs as well. LCSW will contact patient's care support worker during next outreach and Efraim Kaufmann is agreeable to let [name] know about this. David's number is [phone].   Mr. Teacher, adult education for patient's new caregiver was given information about Chronic Care Management services today. RN manager reports that she also has difficulty establishing contact with patient and his family at times.   Review of patient status, including review of consultants reports, relevant laboratory and other test results, and collaboration with appropriate care team members and the patient's provider was performed as part of comprehensive patient evaluation and provision of chronic care management services.    Follow Up Plan: SW will follow up with patient by phone over the next 30 days in order to assess social work needs are initiate goals and careplan       Dickie La, BSW, MSW, LCSW [facility] Care [facility] [place]  [facility] [email or web address] Phone: [phone]

## 2018-12-01 ENCOUNTER — Ambulatory Visit: Payer: Medicare Other | Admitting: Nurse Practitioner

## 2018-12-01 ENCOUNTER — Telehealth: Payer: Self-pay

## 2018-12-02 ENCOUNTER — Other Ambulatory Visit: Payer: Self-pay

## 2018-12-02 ENCOUNTER — Encounter: Payer: Self-pay | Admitting: Nurse Practitioner

## 2018-12-02 ENCOUNTER — Ambulatory Visit (INDEPENDENT_AMBULATORY_CARE_PROVIDER_SITE_OTHER): Payer: Medicare Other | Admitting: Nurse Practitioner

## 2018-12-02 ENCOUNTER — Ambulatory Visit (INDEPENDENT_AMBULATORY_CARE_PROVIDER_SITE_OTHER): Payer: Medicare Other | Admitting: *Deleted

## 2018-12-02 ENCOUNTER — Ambulatory Visit: Payer: Self-pay | Admitting: Pharmacist

## 2018-12-02 VITALS — BP 140/79 | HR 69 | Temp 98.4°F

## 2018-12-02 DIAGNOSIS — I10 Essential (primary) hypertension: Secondary | ICD-10-CM

## 2018-12-02 DIAGNOSIS — E538 Deficiency of other specified B group vitamins: Secondary | ICD-10-CM | POA: Insufficient documentation

## 2018-12-02 DIAGNOSIS — E1142 Type 2 diabetes mellitus with diabetic polyneuropathy: Secondary | ICD-10-CM

## 2018-12-02 DIAGNOSIS — G918 Other hydrocephalus: Secondary | ICD-10-CM

## 2018-12-02 DIAGNOSIS — E785 Hyperlipidemia, unspecified: Secondary | ICD-10-CM

## 2018-12-02 DIAGNOSIS — E1169 Type 2 diabetes mellitus with other specified complication: Secondary | ICD-10-CM

## 2018-12-02 LAB — LIPID PANEL PICCOLO, WAIVED
Chol/HDL Ratio Piccolo,Waive: 2.1 mg/dL
Cholesterol Piccolo, Waived: 123 mg/dL (ref <200)
HDL Chol Piccolo, Waived: 59 mg/dL (ref >59)
LDL Chol Calc Piccolo Waived: 50 mg/dL (ref <100)
Triglycerides Piccolo,Waived: 73 mg/dL (ref <150)
VLDL Chol Calc Piccolo,Waive: 15 mg/dL (ref <30)

## 2018-12-02 LAB — BAYER DCA HB A1C WAIVED: HB A1C (BAYER DCA - WAIVED): 6.7 % (ref <7.0)

## 2018-12-02 MED ORDER — CITALOPRAM HYDROBROMIDE 10 MG PO TABS: 10.0000 mg | ORAL_TABLET | Freq: Every day | ORAL | 2 refills | Status: DC

## 2018-12-02 MED ORDER — ASPIRIN 81 MG PO CHEW: 81.0000 mg | CHEWABLE_TABLET | Freq: Every day | ORAL | 1 refills | Status: DC

## 2018-12-02 MED ORDER — LISINOPRIL 5 MG PO TABS: 5.0000 mg | ORAL_TABLET | Freq: Every day | ORAL | 2 refills | Status: DC

## 2018-12-02 MED ORDER — GABAPENTIN 300 MG PO CAPS: 300.0000 mg | ORAL_CAPSULE | Freq: Three times a day (TID) | ORAL | 2 refills | Status: DC

## 2018-12-02 MED ORDER — ATORVASTATIN CALCIUM 80 MG PO TABS: 80.0000 mg | ORAL_TABLET | Freq: Every day | ORAL | 2 refills | Status: DC

## 2018-12-02 MED ORDER — AMLODIPINE BESYLATE 10 MG PO TABS: 10.0000 mg | ORAL_TABLET | Freq: Every day | ORAL | 1 refills | Status: DC

## 2018-12-02 MED ORDER — CLOPIDOGREL BISULFATE 75 MG PO TABS: 75.0000 mg | ORAL_TABLET | Freq: Every day | ORAL | 2 refills | Status: DC

## 2018-12-02 MED ORDER — CARVEDILOL 6.25 MG PO TABS: 6.2500 mg | ORAL_TABLET | Freq: Two times a day (BID) | ORAL | 2 refills | Status: DC

## 2018-12-02 NOTE — Assessment & Plan Note (Signed)
Chronic, ongoing.  LDL 50 and TCHOL 123.  Continue current medication regimen.  Return in 3 months.

## 2018-12-02 NOTE — Chronic Care Management (AMB) (Signed)
  Chronic Care Management   Follow Up Note   12/02/2018 Name: [name] MRN: [id number] DOB: 1971-01-16  Referred by: Venita Lick, NP Reason for referral : Chronic Care Management (Medication Management)   [name] is a 48 y.o. year old male who is a primary care patient of Cannady, Barbaraann Faster, NP. The CCM team was consulted for assistance with chronic disease management and care coordination needs.    Met with patient and his peer support worker, [name], face to face during appointment with Marnee Guarneri, NP today.   Review of patient status, including review of consultants reports, relevant laboratory and other test results, and collaboration with appropriate care team members and the patient's provider was performed as part of comprehensive patient evaluation and provision of chronic care management services.    Goals Addressed            This Visit's Progress     Patient Stated   . "I don't have my medications" (pt-stated)       Current Barriers:  . Patient reports that he does not have any of his morning Pill Packs. He gets prescriptions from [place]):  Marland Kitchen Over the next 7 days, patient will work with CCM team to address needs related to medication access  Interventions: . Comprehensive medication review performed.  . Contacted [facility]- patient had already contacted the pharmacy today for refills, and they have prepared the pill packs and can deliver to the patient today.  . Reviewed medication list with the pharmacist; all medications on our list match what is being filled  Patient Self Care Activities:  . Self administers medications as prescribed  Initial goal documentation         Plan:  - PharmD will follow up with patient later this week to ensure he received his medications from the [place], PharmD Clinical Pharmacist [place] [phone]

## 2018-12-02 NOTE — Patient Instructions (Signed)
Visit Information  Goals Addressed            This Visit's Progress     Patient Stated   . "I don't have my medications" (pt-stated)       Current Barriers:  . Patient reports that he does not have any of his morning Pill Packs. He gets prescriptions from [facility]  Pharmacist Clinical Goal(s):  Marland Kitchen Over the next 7 days, patient will work with CCM team to address needs related to medication access  Interventions: . Comprehensive medication review performed.  . Contacted [facility]- patient had already contacted the pharmacy today for refills, and they have prepared the pill packs and can deliver to the patient today.  . Reviewed medication list with the pharmacist; all medications on our list match what is being filled  Patient Self Care Activities:  . Self administers medications as prescribed  Initial goal documentation        The patient verbalized understanding of instructions provided today and declined a print copy of patient instruction materials.   Plan:  - PharmD will follow up with patient later this week to ensure he received his medications from the pharmacy  Catie Feliz Beam, PharmD Clinical Pharmacist [facility] [phone]

## 2018-12-02 NOTE — Assessment & Plan Note (Signed)
Chronic, ongoing.  A1C 6.7% today, improved from previous with no further hypoglycemia.  Continue current medication regimen and collaboration with CCM team.  Refills sent.  Recommend checking BS at home at least twice a day and documenting for provider.  Return in 3 months for f/u.

## 2018-12-02 NOTE — Patient Instructions (Signed)
Carbohydrate Counting for Diabetes Mellitus, Adult  Carbohydrate counting is a method of keeping track of how many carbohydrates you eat. Eating carbohydrates naturally increases the amount of sugar (glucose) in the blood. Counting how many carbohydrates you eat helps keep your blood glucose within normal limits, which helps you manage your diabetes (diabetes mellitus). It is important to know how many carbohydrates you can safely have in each meal. This is different for every person. A diet and nutrition specialist (registered dietitian) can help you make a meal plan and calculate how many carbohydrates you should have at each meal and snack. Carbohydrates are found in the following foods:  Grains, such as breads and cereals.  Dried beans and soy products.  Starchy vegetables, such as potatoes, peas, and corn.  Fruit and fruit juices.  Milk and yogurt.  Sweets and snack foods, such as cake, cookies, candy, chips, and soft drinks. How do I count carbohydrates? There are two ways to count carbohydrates in food. You can use either of the methods or a combination of both. Reading "Nutrition Facts" on packaged food The "Nutrition Facts" list is included on the labels of almost all packaged foods and beverages in the [place] It includes:  The serving size.  Information about nutrients in each serving, including the grams (g) of carbohydrate per serving. To use the "Nutrition Facts":  Decide how many servings you will have.  Multiply the number of servings by the number of carbohydrates per serving.  The resulting number is the total amount of carbohydrates that you will be having. Learning standard serving sizes of other foods When you eat carbohydrate foods that are not packaged or do not include "Nutrition Facts" on the label, you need to measure the servings in order to count the amount of carbohydrates:  Measure the foods that you will eat with a food scale or measuring cup, if needed.   Decide how many standard-size servings you will eat.  Multiply the number of servings by 15. Most carbohydrate-rich foods have about 15 g of carbohydrates per serving. ? For example, if you eat 8 oz (170 g) of strawberries, you will have eaten 2 servings and 30 g of carbohydrates (2 servings x 15 g = 30 g).  For foods that have more than one food mixed, such as soups and casseroles, you must count the carbohydrates in each food that is included. The following list contains standard serving sizes of common carbohydrate-rich foods. Each of these servings has about 15 g of carbohydrates:   hamburger bun or  English muffin.   oz (15 mL) syrup.   oz (14 g) jelly.  1 slice of bread.  1 six-inch tortilla.  3 oz (85 g) cooked rice or pasta.  4 oz (113 g) cooked dried beans.  4 oz (113 g) starchy vegetable, such as peas, corn, or potatoes.  4 oz (113 g) hot cereal.  4 oz (113 g) mashed potatoes or  of a large baked potato.  4 oz (113 g) canned or frozen fruit.  4 oz (120 mL) fruit juice.  4-6 crackers.  6 chicken nuggets.  6 oz (170 g) unsweetened dry cereal.  6 oz (170 g) plain fat-free yogurt or yogurt sweetened with artificial sweeteners.  8 oz (240 mL) milk.  8 oz (170 g) fresh fruit or one small piece of fruit.  24 oz (680 g) popped popcorn. Example of carbohydrate counting Sample meal  3 oz (85 g) chicken breast.  6 oz (170 g)   brown rice.  4 oz (113 g) corn.  8 oz (240 mL) milk.  8 oz (170 g) strawberries with sugar-free whipped topping. Carbohydrate calculation 1. Identify the foods that contain carbohydrates: ? Rice. ? Corn. ? Milk. ? Strawberries. 2. Calculate how many servings you have of each food: ? 2 servings rice. ? 1 serving corn. ? 1 serving milk. ? 1 serving strawberries. 3. Multiply each number of servings by 15 g: ? 2 servings rice x 15 g = 30 g. ? 1 serving corn x 15 g = 15 g. ? 1 serving milk x 15 g = 15 g. ? 1 serving  strawberries x 15 g = 15 g. 4. Add together all of the amounts to find the total grams of carbohydrates eaten: ? 30 g + 15 g + 15 g + 15 g = 75 g of carbohydrates total. Summary  Carbohydrate counting is a method of keeping track of how many carbohydrates you eat.  Eating carbohydrates naturally increases the amount of sugar (glucose) in the blood.  Counting how many carbohydrates you eat helps keep your blood glucose within normal limits, which helps you manage your diabetes.  A diet and nutrition specialist (registered dietitian) can help you make a meal plan and calculate how many carbohydrates you should have at each meal and snack. This information is not intended to replace advice given to you by your health care provider. Make sure you discuss any questions you have with your health care provider. Document Released: 06/24/2005 Document Revised: 01/01/2017 Document Reviewed: 12/06/2015 Elsevier Interactive Patient Education  2019 [employer]  

## 2018-12-02 NOTE — Progress Notes (Signed)
BP 140/79   Pulse 69   Temp 98.4 F (36.9 C) (Oral)   SpO2 97%    Subjective:    Patient ID: [name], male    DOB: 1971/02/10, 48 y.o.   MRN: [id number]  HPI: [name] is a 48 y.o. male  Chief Complaint  Patient presents with  . Diabetes  . Hyperlipidemia  . Hypertension   DIABETES Last A1C 5.5%.  At last visit had medication reduction due to hypoglycemia.  Currently on Metformin XR 1000 MG in morning.  Although he reports his pill pack are not right and he is not taking morning pills.  States no one is really helping him check BS, his health aide will not check them.  He forgets to check BS. Hypoglycemic episodes:no Polydipsia/polyuria: no Visual disturbance: no Chest pain: no Paresthesias: no Glucose Monitoring: no  Accucheck frequency: Not Checking  Fasting glucose:  Post prandial:  Evening:  Before meals: Taking Insulin?: no  Long acting insulin:  Short acting insulin: Blood Pressure Monitoring: rarely Retinal Examination: Up to Date Foot Exam: Up to Date Pneumovax: Up to Date Influenza: Up to Date Aspirin: yes   HYPERTENSION / HYPERLIPIDEMIA At home Amlodipine, Atorvastatin, Carvedilol.  Has not been taking medication consistently due to pill pakc issues and missing morning pill pack.   Satisfied with current treatment? yes Duration of hypertension: chronic BP monitoring frequency: rarely BP range: 120-130/80's BP medication side effects: no Duration of hyperlipidemia: chronic Cholesterol medication side effects: no Cholesterol supplements: none Medication compliance: good compliance Aspirin: yes Recent stressors: no Recurrent headaches: no Visual changes: no Palpitations: no Dyspnea: no Chest pain: no Lower extremity edema: no Dizzy/lightheaded: no  Relevant past medical, surgical, family and social history reviewed and updated as indicated. Interim medical history since our last visit reviewed. Allergies and medications reviewed and  updated.  Review of Systems  Constitutional: Negative for activity change, diaphoresis, fatigue and fever.  Respiratory: Negative for cough, chest tightness, shortness of breath and wheezing.   Cardiovascular: Negative for chest pain, palpitations and leg swelling.  Gastrointestinal: Negative for abdominal distention, abdominal pain, constipation, diarrhea, nausea and vomiting.  Endocrine: Negative for cold intolerance, heat intolerance, polydipsia, polyphagia and polyuria.  Musculoskeletal: Negative.   Skin: Negative.   Neurological: Negative for dizziness, syncope, weakness, light-headedness, numbness and headaches.  Psychiatric/Behavioral: Negative.     Per HPI unless specifically indicated above     Objective:    BP 140/79   Pulse 69   Temp 98.4 F (36.9 C) (Oral)   SpO2 97%   Wt Readings from Last 3 Encounters:  09/02/18 285 lb (129.3 kg)  08/27/18 230 lb (104.3 kg)  08/12/18 281 lb (127.5 kg)    Physical Exam Vitals signs and nursing note reviewed.  Constitutional:      Appearance: He is well-developed.  HENT:     Head: Normocephalic and atraumatic.     Right Ear: Hearing normal. No drainage.     Left Ear: Hearing normal. No drainage.     Mouth/Throat:     Pharynx: Uvula midline.  Eyes:     General: Lids are normal.        Right eye: No discharge.        Left eye: No discharge.     Conjunctiva/sclera: Conjunctivae normal.     Pupils: Pupils are equal, round, and reactive to light.  Neck:     Musculoskeletal: Normal range of motion and neck supple.     Thyroid:  No thyromegaly.     Vascular: No carotid bruit.     Trachea: Trachea normal.  Cardiovascular:     Rate and Rhythm: Normal rate and regular rhythm.     Heart sounds: Normal heart sounds, S1 normal and S2 normal. No murmur. No gallop.   Pulmonary:     Effort: Pulmonary effort is normal.     Breath sounds: Normal breath sounds.  Abdominal:     General: Bowel sounds are normal.     Palpations: Abdomen  is soft. There is no hepatomegaly or splenomegaly.  Musculoskeletal: Normal range of motion.     Right lower leg: Edema (trace) present.     Left lower leg: Edema (trace) present.  Skin:    General: Skin is warm and dry.     Capillary Refill: Capillary refill takes less than 2 seconds.     Findings: No rash.  Neurological:     Mental Status: He is alert and oriented to person, place, and time.  Psychiatric:        Mood and Affect: Mood normal.        Behavior: Behavior normal.        Thought Content: Thought content normal.        Judgment: Judgment normal.     Results for orders placed or performed in visit on 09/02/18  Bayer DCA Hb A1c Waived  Result Value Ref Range   HB A1C (BAYER DCA - WAIVED) 5.5 <7.0 %  Lipid Panel w/o Chol/HDL Ratio  Result Value Ref Range   Cholesterol, Total 116 100 - 199 mg/dL   Triglycerides 68 0 - 149 mg/dL   HDL 50 >60 mg/dL   VLDL Cholesterol Cal 14 5 - 40 mg/dL   LDL Calculated 52 0 - 99 mg/dL  Comprehensive metabolic panel  Result Value Ref Range   Glucose 151 (H) 65 - 99 mg/dL   BUN 12 6 - 24 mg/dL   Creatinine, Ser 0.45 0.76 - 1.27 mg/dL   GFR calc non Af Amer 77 >59 mL/min/1.73   GFR calc Af Amer 89 >59 mL/min/1.73   BUN/Creatinine Ratio 11 9 - 20   Sodium 141 134 - 144 mmol/L   Potassium 4.9 3.5 - 5.2 mmol/L   Chloride 102 96 - 106 mmol/L   CO2 20 20 - 29 mmol/L   Calcium 9.8 8.7 - 10.2 mg/dL   Total Protein 6.4 6.0 - 8.5 g/dL   Albumin 4.3 4.0 - 5.0 g/dL   Globulin, Total 2.1 1.5 - 4.5 g/dL   Albumin/Globulin Ratio 2.0 1.2 - 2.2   Bilirubin Total 0.5 0.0 - 1.2 mg/dL   Alkaline Phosphatase 64 39 - 117 IU/L   AST 11 0 - 40 IU/L   ALT 14 0 - 44 IU/L  CBC with Differential/Platelet  Result Value Ref Range   WBC 4.0 3.4 - 10.8 x10E3/uL   RBC 4.28 4.14 - 5.80 x10E6/uL   Hemoglobin 13.1 13.0 - 17.7 g/dL   Hematocrit 99.7 74.1 - 51.0 %   MCV 89 79 - 97 fL   MCH 30.6 26.6 - 33.0 pg   MCHC 34.5 31.5 - 35.7 g/dL   RDW 42.3 95.3 -  20.2 %   Platelets 275 150 - 450 x10E3/uL   Neutrophils 56 Not Estab. %   Lymphs 24 Not Estab. %   Monocytes 10 Not Estab. %   Eos 9 Not Estab. %   Basos 1 Not Estab. %   Neutrophils Absolute 2.3 1.4 -  7.0 x10E3/uL   Lymphocytes Absolute 1.0 0.7 - 3.1 x10E3/uL   Monocytes Absolute 0.4 0.1 - 0.9 x10E3/uL   EOS (ABSOLUTE) 0.3 0.0 - 0.4 x10E3/uL   Basophils Absolute 0.0 0.0 - 0.2 x10E3/uL   Immature Granulocytes 0 Not Estab. %   Immature Grans (Abs) 0.0 0.0 - 0.1 x10E3/uL  B12  Result Value Ref Range   Vitamin B-12 223 (L) 232 - 1,245 pg/mL      Assessment & Plan:   Problem List Items Addressed This Visit      Cardiovascular and Mediastinum   HTN (hypertension)    Chronic, ongoing.  BP slightly above goal today, but at home on rare checks is at goal.  Continue current medication regimen and adjust as needed at next visit.  Recommend checking BP daily at home and documenting for provider.  He has a BP cuff available at his home.  Return in 3 months for f/u.      Relevant Medications   atorvastatin (LIPITOR) 80 MG tablet   aspirin 81 MG chewable tablet   amLODipine (NORVASC) 10 MG tablet   carvedilol (COREG) 6.25 MG tablet   lisinopril (ZESTRIL) 5 MG tablet     Endocrine   Hyperlipidemia associated with type 2 diabetes mellitus (HCC)    Chronic, ongoing.  LDL 50 and TCHOL 123.  Continue current medication regimen.  Return in 3 months.      Relevant Medications   atorvastatin (LIPITOR) 80 MG tablet   aspirin 81 MG chewable tablet   lisinopril (ZESTRIL) 5 MG tablet   Other Relevant Orders   Lipid Panel Piccolo, Waived     Nervous and Auditory   Type 2 diabetes mellitus with diabetic polyneuropathy (HCC) - Primary    Chronic, ongoing.  A1C 6.7% today, improved from previous with no further hypoglycemia.  Continue current medication regimen and collaboration with CCM team.  Refills sent.  Recommend checking BS at home at least twice a day and documenting for provider.  Return  in 3 months for f/u.      Relevant Medications   atorvastatin (LIPITOR) 80 MG tablet   aspirin 81 MG chewable tablet   citalopram (CELEXA) 10 MG tablet   gabapentin (NEURONTIN) 300 MG capsule   lisinopril (ZESTRIL) 5 MG tablet   Other Relevant Orders   Bayer DCA Hb A1c Waived     Other   Vitamin B12 deficiency    Recheck B12 level and continue supplement.      Relevant Orders   B12       Follow up plan: Return in about 3 months (around 03/04/2019) for T2DM, HTN/HLD.

## 2018-12-02 NOTE — Assessment & Plan Note (Signed)
Chronic, ongoing.  BP slightly above goal today, but at home on rare checks is at goal.  Continue current medication regimen and adjust as needed at next visit.  Recommend checking BP daily at home and documenting for provider.  He has a BP cuff available at his home.  Return in 3 months for f/u.

## 2018-12-02 NOTE — Assessment & Plan Note (Signed)
Recheck B12 level and continue supplement.

## 2018-12-03 LAB — VITAMIN B12: Vitamin B-12: 670 pg/mL (ref 232–1245)
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
## 2018-12-03 NOTE — Progress Notes (Signed)
Normal test results noted.  Please call patient and make them aware of normal results and will continue to monitor at regular visits.  Have a great day.  Look forward to seeing you at your next visit.

## 2018-12-15 ENCOUNTER — Telehealth: Payer: Self-pay

## 2018-12-16 ENCOUNTER — Ambulatory Visit: Payer: Self-pay | Admitting: *Deleted

## 2018-12-16 ENCOUNTER — Telehealth: Payer: Self-pay

## 2018-12-16 DIAGNOSIS — E1142 Type 2 diabetes mellitus with diabetic polyneuropathy: Secondary | ICD-10-CM

## 2018-12-16 DIAGNOSIS — H5015 Alternating exotropia: Secondary | ICD-10-CM | POA: Diagnosis not present

## 2018-12-16 DIAGNOSIS — H35011 Changes in retinal vascular appearance, right eye: Secondary | ICD-10-CM | POA: Diagnosis not present

## 2018-12-16 DIAGNOSIS — H40023 Open angle with borderline findings, high risk, bilateral: Secondary | ICD-10-CM | POA: Diagnosis not present

## 2018-12-16 DIAGNOSIS — E113293 Type 2 diabetes mellitus with mild nonproliferative diabetic retinopathy without macular edema, bilateral: Secondary | ICD-10-CM | POA: Diagnosis not present

## 2018-12-16 NOTE — Chronic Care Management (AMB) (Signed)
  Chronic Care Management   Outreach Note  12/16/2018 Name: [name] MRN: [id number] DOB: [birth year]  Referred by: Venita Lick, NP Reason for referral : No chief complaint on file.   An unsuccessful telephone outreach was attempted today. The patient was referred to the case management team by for assistance with chronic care management and care coordination.   Attempted to reach patient's RN with [facility] an [facility] without success, left vm. Attempted to reach [name] patient's advocate payee without success, left vm.  Follow Up Plan: A HIPPA compliant phone message was left for the patient providing contact information and requesting a return call.  The care management team will reach out to the patient again over the next 14 days.   Merlene Morse Kristalyn Bergstresser RN, BSN Nurse Case Editor, commissioning Family Practice/[facility] Care Management  [phone]) Business Mobile

## 2018-12-30 ENCOUNTER — Telehealth: Payer: Self-pay

## 2019-01-01 ENCOUNTER — Other Ambulatory Visit: Payer: Self-pay

## 2019-01-01 ENCOUNTER — Ambulatory Visit (INDEPENDENT_AMBULATORY_CARE_PROVIDER_SITE_OTHER): Payer: Medicare Other | Admitting: Podiatry

## 2019-01-01 ENCOUNTER — Encounter: Payer: Self-pay | Admitting: Podiatry

## 2019-01-01 VITALS — Temp 97.8°F

## 2019-01-01 DIAGNOSIS — Z9181 History of falling: Secondary | ICD-10-CM

## 2019-01-01 DIAGNOSIS — B351 Tinea unguium: Secondary | ICD-10-CM | POA: Diagnosis not present

## 2019-01-01 DIAGNOSIS — M79676 Pain in unspecified toe(s): Secondary | ICD-10-CM | POA: Diagnosis not present

## 2019-01-01 DIAGNOSIS — Z8673 Personal history of transient ischemic attack (TIA), and cerebral infarction without residual deficits: Secondary | ICD-10-CM

## 2019-01-01 DIAGNOSIS — M79673 Pain in unspecified foot: Secondary | ICD-10-CM

## 2019-01-01 DIAGNOSIS — E0843 Diabetes mellitus due to underlying condition with diabetic autonomic (poly)neuropathy: Secondary | ICD-10-CM

## 2019-01-04 ENCOUNTER — Telehealth: Payer: Self-pay

## 2019-01-05 NOTE — Progress Notes (Signed)
   HPI: 48 year old male h/o diabetes mellitus, and history of multiple strokes presents to the office today for evaluation of elongated, thickened nails that cause pain while ambulating in shoes. He is unable to trim his own nails. Patient is here for further evaluation and treatment.   Past Medical History:  Diagnosis Date  . Depression   . Diabetes mellitus without complication ([place])   . Hydrocephalus in adult [facility])   . Hyperlipidemia   . Hypertension   . Seizures ([place])   . Stroke [facility])      Physical Exam: General: The patient is alert and oriented x3 in no acute distress.  Dermatology: Skin is warm, dry and supple bilateral lower extremities. Negative for open lesions or macerations.  Hypertrophic elongated nails noted 1-5 bilateral  Vascular: Palpable pedal pulses bilaterally.  There is some moderate edema noted to the right foot and ankle. Capillary refill within normal limits.  Neurological: Epicritic and protective threshold diminished bilaterally.   Musculoskeletal Exam: Range of motion within normal limits to all pedal and ankle joints bilateral.  Loss of muscle strength mobility noted to the bilateral lower extremity secondary to the previous history of strokes  Assessment: 1. H/o strokes, multiple 2. Diabetes mellitus w/ peripheral polyneuropathy 3. Pain due to onychomycosis of toenail 4. H/o multiple recurrent falls   Plan of Care:  1. Patient evaluated.   2.  Mechanical debridement of nails 1-5 was performed using a nail nipper without incident or bleeding 3. Today we can arrange an appointment for [facility], Pedorthist, for custom molded bilateral [name] balance braces. His last appointment with Liliane Channel was cancelled.  4. Return to clinic in 3 months.       Edrick Kins, DPM [facility]  Dr. Edrick Kins, DPM    [address], [place] [address]                Office [phone]  Fax [phone]

## 2019-01-06 ENCOUNTER — Other Ambulatory Visit: Payer: Self-pay

## 2019-01-06 ENCOUNTER — Ambulatory Visit: Payer: Medicare Other | Admitting: Orthotics

## 2019-01-06 ENCOUNTER — Telehealth: Payer: Self-pay | Admitting: Nurse Practitioner

## 2019-01-06 DIAGNOSIS — M6788 Other specified disorders of synovium and tendon, other site: Secondary | ICD-10-CM

## 2019-01-06 DIAGNOSIS — Z9181 History of falling: Secondary | ICD-10-CM

## 2019-01-06 DIAGNOSIS — Z8673 Personal history of transient ischemic attack (TIA), and cerebral infarction without residual deficits: Secondary | ICD-10-CM

## 2019-01-06 NOTE — Telephone Encounter (Signed)
Copied from [place] [id number]. Topic: General - Other >> Jan 06, 2019 12:13 PM Sheran Luz wrote: [name], case worker, calling to request hospital bed for patient, stating that patient "plops" down on regular bed too hard and breaks the frame. [name] states they will need RX and also letter of medical necessity.  [name] is requesting call back from [place] or PCP to discuss further. Please advise.

## 2019-01-06 NOTE — Progress Notes (Signed)
Attempted to cast today for b/l Balance braces; however, since he has such a large foot, I was unable to cast with material I had on hand.  Also, I want to talke with Dr. Amalia Hailey about the best possible outcome for [name].  He has had multiple strokes which has left him with weak musculature in both frontal and saggital planes; also his R foot presents with cavus deformity and is varus.  Gait analysis reveals he also has moderate internal tibial torsion and genu recurvatim.   A possible better outcome may come from a custom AFO brace with dorsi assist springs and posterior stops. l would suggest an Rx to [facility] for this.

## 2019-01-13 ENCOUNTER — Other Ambulatory Visit: Payer: Self-pay

## 2019-01-13 ENCOUNTER — Ambulatory Visit: Payer: Medicare Other | Admitting: Licensed Clinical Social Worker

## 2019-01-13 DIAGNOSIS — E1142 Type 2 diabetes mellitus with diabetic polyneuropathy: Secondary | ICD-10-CM

## 2019-01-13 DIAGNOSIS — G918 Other hydrocephalus: Secondary | ICD-10-CM

## 2019-01-13 NOTE — Chronic Care Management (AMB) (Signed)
  Chronic Care Management    Clinical Social Work Follow Up Note  01/13/2019 Name: [name] MRN: [id number] DOB: [birth year]  [name] is a 48 y.o. year old male who is a primary care patient of Cannady, Barbaraann Faster, NP. The CCM team was consulted for assistance with [employer].   Review of patient status, including review of consultants reports, other relevant assessments, and collaboration with appropriate care team members and the patient's provider was performed as part of comprehensive patient evaluation and provision of chronic care management services.     Goals Addressed    . "He needs as much support as he can get"       Current Barriers:  . Financial constraints . Limited social support . Level of care concerns . ADL IADL limitations . Mental Health Concerns  . Social Isolation . Limited education about criss support resources within the area* . Limited access to caregiver . Cognitive Deficits . Memory Deficits . Lacks knowledge of community resource: financial assistance and support resources that are eligible for patient to use.  Clinical Social Work Clinical Goal(s):  Marland Kitchen Over the next 90 days, client will work with SW to address concerns related to lack of self-care and his need for additional support and resources  Interventions: . Patient interviewed and appropriate assessments performed . Provided patient's aide with information about crisis support and financial assistance support resources within the area . Discussed plans with patient for ongoing care management follow up and provided patient with direct contact information for care management team . Advised patient to contact PCP office if they continue to have issues with gaining DME . Collaborated with PCP and RN Case Manager re: need for hospital bed . Collaborated with patient's caregiver [name]  re: patient's need for additional support . Assisted patient/caregiver with obtaining information about  health plan benefits . Provided education to patient/caregiver regarding level of care options. . Confirmed with patient's caregiver that patient received a new pair of eyeglasses   Patient Self Care Activities:  . Attends all scheduled provider appointments . Calls provider office for new concerns or questions  Initial goal documentation   Follow Up Plan: SW will follow up with patient by phone over the next month  Eula Fried, [place], MSW, [email or web address] Phone: [phone]

## 2019-01-20 ENCOUNTER — Ambulatory Visit: Payer: Medicare Other | Admitting: Orthotics

## 2019-01-20 ENCOUNTER — Other Ambulatory Visit: Payer: Self-pay

## 2019-01-20 ENCOUNTER — Ambulatory Visit: Payer: Medicare Other | Admitting: *Deleted

## 2019-01-20 DIAGNOSIS — Z9181 History of falling: Secondary | ICD-10-CM

## 2019-01-20 DIAGNOSIS — Z8673 Personal history of transient ischemic attack (TIA), and cerebral infarction without residual deficits: Secondary | ICD-10-CM

## 2019-01-20 DIAGNOSIS — G918 Other hydrocephalus: Secondary | ICD-10-CM

## 2019-01-20 NOTE — Telephone Encounter (Signed)
Pt calling to get an update on this prescription for his bed. Please advise.

## 2019-01-20 NOTE — Chronic Care Management (AMB) (Signed)
  Chronic Care Management   Follow Up Note   01/20/2019 Name: [name] MRN: [id number] DOB: [birth year]  Referred by: Venita Lick, NP Reason for referral : Chronic Care Management (Need for Hospital Bed)   [name] is a 48 y.o. year old male who is a primary care patient of Cannady, Barbaraann Faster, NP. The CCM team was consulted for assistance with chronic disease management and care coordination needs.    Review of patient status, including review of consultants reports, relevant laboratory and other test results, and collaboration with appropriate care team members and the patient's provider was performed as part of comprehensive patient evaluation and provision of chronic care management services.    Goals Addressed    . Patient is in need of a hospital bed (pt-stated)       Current Barriers:  . Cognitive Deficits . Chronic Disease Management support and education needs related to obtaining a hospital bed  Nurse Case Manager Clinical Goal(s):  Marland Kitchen Over the next 90 days, patient will work with [name] to address needs related to obtaining a hospital bed . Over the next 90 days, patient will work with [name] (community agency) to obtain a hospital bed  Interventions:  . Collaborated with PCP and [name] regarding patient's need for a hospital bed. Paperwork completed and faxed to [place] [phone]) Emailed [name] he confirmed fax received. . Discussed plans with patient for ongoing care management follow up and provided patient with direct contact information for care management team . Provided patient and/or caregiver with verbal information about patient's hospital bed order (community resource). . Received a vm from [name] at [facility] letting me know patient will be eligible for hospital bed delivery on July 24 related to patient received a bed 5 years ago and in order for insurance to cover the cost it has to be 5 years form  the last hospital bed received. [name] stated he was working this out with patient.  . Plan to f/u after July 24 to ensure patient received bed  Patient Self Care Activities:  . Currently UNABLE TO independently get in out of his regular bed without great difficulty related to physical disability   Initial goal documentation.        The care management team will reach out to the patient again over the next 30 days.  The patient has been provided with contact information for the care management team and has been advised to call with any health related questions or concerns.    Merlene Morse Ameah Chanda RN, BSN Nurse Case Editor, commissioning Family Practice/[facility] Care Management  [phone]) Business [employer]

## 2019-01-20 NOTE — Patient Instructions (Signed)
Thank you allowing the Chronic Care Management Team to be a part of your care! It was a pleasure speaking with you today!   CCM (Chronic Care Management) Team   Joe Gee RN, BSN Nurse Care Coordinator  [phone]  Catie [facility] PharmD  Clinical Pharmacist  [phone]  Eula Fried LCSW Clinical Social Worker [phone]  Goals Addressed            This Visit's Progress   . COMPLETED: "He gets constipated"  (pt-stated)       Current Barriers:  Marland Kitchen Knowledge Deficits related to how to address issues with constipation.  Nurse Case Manager Clinical Goal(s):  Marland Kitchen Over the next 30 days, patient will verbalize understanding of plan for decreased constipation.  . Over the next 30  days, patient will work with [name]/Caregiver to address needs related to constipation . Over the next 30 days, patient will demonstrate a decrease in constipation as evidenced by conversation with caregiver to evaluate decrease in patient's symptoms.   Interventions:  . Advised patient to increase water, vegetables and fiber intake.  Marland Kitchen Provided education to patient re: ways to decrease constipation. Nash Dimmer with CCM pharmacist  regarding who advised caregiver to add colace to patient's medications when patient having these symptoms.  . Discussed with caregiver adding more salads and fiber to diet. . Plan to call [facility] RN manager and make her aware patient needs . Discussed with care-giver M . Patient reports improvement  Patient Self Care Activities:  . Currently UNABLE TO independently manage medications or diet related to constipation  Please see past updates related to this goal by clicking on the "Past Updates" button in the selected goal       . Patient is in need of a hospital bed (pt-stated)       Current Barriers:  . Cognitive Deficits . Chronic Disease Management support and education needs related to obtaining a hospital bed  Nurse Case Manager Clinical  Goal(s):  Marland Kitchen Over the next 90 days, patient will work with [facility] to address needs related to obtaining a hospital bed . Over the next 90 days, patient will work with [place] [facility] (community agency) to obtain a hospital bed  Interventions:  . Collaborated with PCP and [facility] regarding patient's need for a hospital bed. Paperwork completed and faxed to [name] [phone]) Emailed Hermina Barters he confirmed fax received. . Discussed plans with patient for ongoing care management follow up and provided patient with direct contact information for care management team . Provided patient and/or caregiver with verbal information about patient's hospital bed order (community resource). . Received a vm from Hermina Barters at [facility] letting me know patient will be eligible for hospital bed delivery on July 24 related to patient received a bed 5 years ago and in order for insurance to cover the cost it has to be 5 years form the last hospital bed received. Mr. Oletta Lamas stated he was working this out with patient.  . Plan to f/u after July 24 to ensure bed was delivered   Patient Self Care Activities:  . Currently UNABLE TO independently get in out of his regular bed without great difficulty related to physical disability   Initial goal documentation.       The patient verbalized understanding of instructions provided today and declined a print copy of patient instruction materials.   The patient has been provided with contact information for the care management team and has  been advised to call with any health related questions or concerns.

## 2019-01-20 NOTE — Progress Notes (Signed)
Sending to [facility] clinic due to multitude of issues with balance and gait; hx of CVA/Stroke, severe ankle instability. [facility] to evaluate/assess need of either MAFO attached to shoe or AFO w/ dorsi assist and posterior stops to address moderate genu recurvatim,

## 2019-01-20 NOTE — Telephone Encounter (Signed)
[name] has order.

## 2019-02-10 ENCOUNTER — Ambulatory Visit (INDEPENDENT_AMBULATORY_CARE_PROVIDER_SITE_OTHER): Payer: Medicare Other | Admitting: *Deleted

## 2019-02-10 DIAGNOSIS — I10 Essential (primary) hypertension: Secondary | ICD-10-CM | POA: Diagnosis not present

## 2019-02-10 DIAGNOSIS — G918 Other hydrocephalus: Secondary | ICD-10-CM

## 2019-02-10 NOTE — Patient Instructions (Signed)
Thank you allowing the Chronic Care Management Team to be a part of your care! It was a pleasure speaking with you today!   CCM (Chronic Care Management) Team   Kamron Portee RN, BSN Nurse Care Coordinator  [phone]  Catie [facility] PharmD  Clinical Pharmacist  [phone]  Eula Fried LCSW Clinical Social Worker [phone]  Goals Addressed            This Visit's Progress   . "We want to keep an eye on his blood pressure" (pt-stated)       Current Barriers:  Marland Kitchen Knowledge Deficits related to basic understanding of hypertension pathophysiology and self care management . Non-adherence to scheduled provider appointments . Cognitive Deficits - chronic brain-hydrocephalus syndrome  Nurse Case Manager Clinical Goal(s):  Marland Kitchen Over the next 60 days, patient will verbalize understanding of plan for hypertension management . Over the next 90 days, patient will attend all scheduled medical appointments: PCP 12/01/18 10:15am . Over the next 90 days, patient will demonstrate improved adherence to prescribed treatment plan for  hypertension as evidenced by taking all medications as prescribed, monitoring and recording blood pressure as directed  Interventions:  . Spoke with [name] patient's payee, david reports he is fixing a note book for patient's blood pressure to be written down.  Narda Amber aware I was unable to get in contact with Hoyt Koch who is RN over patient's care givers in home with [facility].   Patient Self Care Activities:  . UNABLE to independently self monitor blood pressure - caregiver has purchased digital bp monitor and will being daily monitoring and recording  Please see past updates related to this goal by clicking on the "Past Updates" button in the selected goal       . COMPLETED: Patient is in need of a hospital bed (pt-stated)       Current Barriers:  . Cognitive Deficits . Chronic Disease Management support and education needs related  to obtaining a hospital bed  Nurse Case Manager Clinical Goal(s):  Marland Kitchen Over the next 90 days, patient will work with [facility] to address needs related to obtaining a hospital bed . Over the next 90 days, patient will work with [place] [facility] (community agency) to obtain a hospital bed  Interventions:  . Collaborated with PCP and [facility] regarding patient's need for a hospital bed. Paperwork completed and faxed to [place] [phone]) Emailed Hermina Barters he confirmed fax received. . Discussed plans with patient for ongoing care management follow up and provided patient with direct contact information for care management team . Provided patient and/or caregiver with verbal information about patient's hospital bed order (community resource). . Received a vm from Hermina Barters at [facility] letting me know patient will be eligible for hospital bed delivery on July 24 related to patient received a bed 5 years ago and in order for insurance to cover the cost it has to be 5 years form the last hospital bed received. Mr. Oletta Lamas stated he was working this out with patient.  . Plan to f/u after July 24 to ensure bed was delivered  . Follow up call today and patient's payee [name] stated bed was delivered.  Patient Self Care Activities:  . Currently UNABLE TO independently get in out of his regular bed without great difficulty related to physical disability   Initial goal documentation.       The patient verbalized understanding of instructions provided today and declined a  print copy of patient instruction materials.   The patient has been provided with contact information for the care management team and has been advised to call with any health related questions or concerns.

## 2019-02-10 NOTE — Chronic Care Management (AMB) (Signed)
Chronic Care Management   Follow Up Note   02/10/2019 Name: [name] MRN: [id number] DOB: 1971/07/05  Referred by: Patrick Skiff, NP Reason for referral : Chronic Care Management (DM2, Chronic Brain hydrocephalus ) and Care Coordination [facility] Bed)   [name] is a 48 y.o. year old male who is a primary care patient of Cannady, Patrick Rank, NP. The CCM team was consulted for assistance with chronic disease management and care coordination needs.    Review of patient status, including review of consultants reports, relevant laboratory and other test results, and collaboration with appropriate care team members and the patient's provider was performed as part of comprehensive patient evaluation and provision of chronic care management services.    Goals Addressed            This Visit's Progress   . "We want to keep an eye on his blood pressure" (pt-stated)       Current Barriers:  Marland Kitchen Knowledge Deficits related to basic understanding of hypertension pathophysiology and self care management . Non-adherence to scheduled provider appointments . Cognitive Deficits - chronic brain-hydrocephalus syndrome  Nurse Case Manager Clinical Goal(s):  Marland Kitchen Over the next 60 days, patient will verbalize understanding of plan for hypertension management . Over the next 90 days, patient will attend all scheduled medical appointments: PCP 12/01/18 10:15am . Over the next 90 days, patient will demonstrate improved adherence to prescribed treatment plan for  hypertension as evidenced by taking all medications as prescribed, monitoring and recording blood pressure as directed  Interventions:  . Spoke with [name] patient's payee, david reports he is fixing a note book for patient's blood pressure to be written down.  Garnetta Buddy aware I was unable to get in contact with Isabelle Course who is RN over patient's care givers in home with [facility] [employer].   Patient Self Care Activities:  . UNABLE  to independently self monitor blood pressure - caregiver has purchased digital bp monitor and will being daily monitoring and recording  Please see past updates related to this goal by clicking on the "Past Updates" button in the selected goal       . COMPLETED: Patient is in need of a hospital bed (pt-stated)       Current Barriers:  . Cognitive Deficits . Chronic Disease Management support and education needs related to obtaining a hospital bed  Nurse Case Manager Clinical Goal(s):  Marland Kitchen Over the next 90 days, patient will work with [facility] to address needs related to obtaining a hospital bed . Over the next 90 days, patient will work with [facility] (community agency) to obtain a hospital bed  Interventions:  . Collaborated with PCP and [facility] regarding patient's need for a hospital bed. Paperwork completed and faxed to DME company [phone]) Emailed Hollice Gong he confirmed fax received. . Discussed plans with patient for ongoing care management follow up and provided patient with direct contact information for care management team . Provided patient and/or caregiver with verbal information about patient's hospital bed order (community resource). . Received a vm from Hollice Gong at [facility] letting me know patient will be eligible for hospital bed delivery on July 24 related to patient received a bed 5 years ago and in order for insurance to cover the cost it has to be 5 years form the last hospital bed received. [name] stated he was working this out with patient.  . Plan to f/u after July  24 to ensure bed was delivered  . Follow up call today and patient's payee [name] stated bed was delivered.  Patient Self Care Activities:  . Currently UNABLE TO independently get in out of his regular bed without great difficulty related to physical disability   Initial goal documentation.        The care management team will reach out to the patient again over  the next 30 days.  The patient has been provided with contact information for the care management team and has been advised to call with any health related questions or concerns.   Merlene Morse Layli Capshaw RN, BSN Nurse Case Editor, commissioning Family Practice/[facility] Care Management  [phone]) [employer]

## 2019-02-17 ENCOUNTER — Other Ambulatory Visit: Payer: Self-pay | Admitting: Nurse Practitioner

## 2019-02-24 ENCOUNTER — Ambulatory Visit: Payer: Medicare Other | Admitting: Licensed Clinical Social Worker

## 2019-02-24 DIAGNOSIS — Z8673 Personal history of transient ischemic attack (TIA), and cerebral infarction without residual deficits: Secondary | ICD-10-CM

## 2019-02-24 NOTE — Chronic Care Management (AMB) (Signed)
Chronic Care Management    Clinical Social Work Follow Up Note  02/24/2019 Name: [name] MRN: [id number] DOB: [birth year]  [name] is a 48 y.o. year old male who is a primary care patient of Cannady, Barbaraann Faster, NP. The CCM team was consulted for assistance with Financial Difficulties related to managing health care.   Review of patient status, including review of consultants reports, other relevant assessments, and collaboration with appropriate care team members and the patient's provider was performed as part of comprehensive patient evaluation and provision of chronic care management services.    Outpatient Encounter Medications as of 02/24/2019  Medication Sig  . amLODipine (NORVASC) 10 MG tablet Take 1 tablet (10 mg total) by mouth daily.  Marland Kitchen aspirin 81 MG chewable tablet Chew 1 tablet (81 mg total) by mouth daily.  Marland Kitchen atorvastatin (LIPITOR) 80 MG tablet Take 1 tablet (80 mg total) by mouth daily at 6 PM.  . blood glucose meter kit and supplies KIT Dispense based on patient and insurance preference. Use up to four times daily as directed. (FOR ICD-9 250.00, 250.01).  . carvedilol (COREG) 6.25 MG tablet Take 1 tablet (6.25 mg total) by mouth 2 (two) times daily with a meal.  . citalopram (CELEXA) 10 MG tablet Take 1 tablet (10 mg total) by mouth daily.  . clopidogrel (PLAVIX) 75 MG tablet Take 1 tablet (75 mg total) by mouth daily with breakfast.  . gabapentin (NEURONTIN) 300 MG capsule Take 1 capsule (300 mg total) by mouth 3 (three) times daily.  Marland Kitchen glucose blood (ACCU-CHEK AVIVA) test strip Use as instructed  . Lancet Devices (ACCU-CHEK SOFTCLIX) lancets Use as instructed  . lisinopril (ZESTRIL) 5 MG tablet Take 1 tablet (5 mg total) by mouth daily.  . metFORMIN (GLUCOPHAGE-XR) 500 MG 24 hr tablet TAKE 2 TABLETS BY MOUTH ONCE DAILY WITH BREAKFAST  . vitamin B-12 (CYANOCOBALAMIN) 1000 MCG tablet Take 1 tablet (1,000 mcg total) by mouth daily.   No facility-administered encounter  medications on file as of 02/24/2019.      Goals Addressed    . "He needs as much support as he can get"       Current Barriers:  . Financial constraints . Limited social support . Level of care concerns . ADL IADL limitations . Mental Health Concerns  . Social Isolation . Limited education about criss support resources within the area* . Limited access to caregiver . Cognitive Deficits . Memory Deficits . Lacks knowledge of community resource: financial assistance and support resources that are eligible for patient to use.  Clinical Social Work Clinical Goal(s):  Marland Kitchen Over the next 90 days, client will work with SW to address concerns related to lack of self-care and his need for additional support and resources  Interventions: . Patient interviewed and appropriate assessments performed . Provided patient's aide with information about crisis support and financial assistance support resources within the area . Discussed plans with patient for ongoing care management follow up and provided patient with direct contact information for care management team . Confirmed that patient received needed DME (hospital bed) . Confirmed stable transportation to [facility]: Prosthetics and Orthotics appointment tomorrow where patient will receive a new leg brace. Nash Dimmer with patient's caregiver [name]  re: patient's need for additional support . Confirmed that patient now has a Teacher, adult education  . Assisted patient/caregiver with obtaining information about health plan benefits . Provided education to patient/caregiver regarding level of care options. . Confirmed with patient's caregiver  that patient new pair of eyeglasses are working well for him  Patient Self Care Activities:  . Attends all scheduled provider appointments . Calls provider office for new concerns or questions  Please see past updates related to this goal by clicking on the "Past Updates" button in the selected goal       Follow Up Plan: SW will follow up with patient by phone over the next 60 days  Eula Fried, [place], MSW, [email or web address] Phone: [phone]

## 2019-03-04 ENCOUNTER — Encounter: Payer: Self-pay | Admitting: Nurse Practitioner

## 2019-03-04 ENCOUNTER — Other Ambulatory Visit: Payer: Self-pay

## 2019-03-04 ENCOUNTER — Ambulatory Visit (INDEPENDENT_AMBULATORY_CARE_PROVIDER_SITE_OTHER): Payer: Medicare Other | Admitting: Nurse Practitioner

## 2019-03-04 VITALS — BP 118/74 | HR 75 | Temp 98.6°F

## 2019-03-04 DIAGNOSIS — E1142 Type 2 diabetes mellitus with diabetic polyneuropathy: Secondary | ICD-10-CM | POA: Diagnosis not present

## 2019-03-04 DIAGNOSIS — E785 Hyperlipidemia, unspecified: Secondary | ICD-10-CM | POA: Diagnosis not present

## 2019-03-04 DIAGNOSIS — E1165 Type 2 diabetes mellitus with hyperglycemia: Secondary | ICD-10-CM | POA: Diagnosis not present

## 2019-03-04 DIAGNOSIS — E1169 Type 2 diabetes mellitus with other specified complication: Secondary | ICD-10-CM | POA: Diagnosis not present

## 2019-03-04 DIAGNOSIS — I10 Essential (primary) hypertension: Secondary | ICD-10-CM | POA: Diagnosis not present

## 2019-03-04 LAB — BAYER DCA HB A1C WAIVED: HB A1C (BAYER DCA - WAIVED): 7.3 % — ABNORMAL HIGH (ref <7.0)

## 2019-03-04 NOTE — Assessment & Plan Note (Signed)
Chronic, ongoing.  BP at goal today.  Continue current medication regimen and adjust as needed at next visit.  Recommend checking BP daily at home and documenting for provider.  He has a BP cuff available at his home.  Return in 3 months for f/u.

## 2019-03-04 NOTE — Assessment & Plan Note (Signed)
Chronic, ongoing.  A1C today slightly above goal at 7.3%.  Due to history of hypoglycemia will maintain current medication regimen at this time and if continued elevation next visit consider SGLT or GLP add on.  Recommend he focus on diet, as still tends to snack a lot at home.  Continue checking BS daily at home.  Return in 3 months.

## 2019-03-04 NOTE — Progress Notes (Signed)
BP 118/74   Pulse 75   Temp 98.6 F (37 C) (Oral)   SpO2 97%    Subjective:    Patient ID: [name], male    DOB: 1970/10/18, 48 y.o.   MRN: [id number]  HPI: [name] is a 48 y.o. male  Chief Complaint  Patient presents with  . Diabetes  . Hypertension  . Hyperlipidemia    . This visit was completed via telephone due to the restrictions of the COVID-19 pandemic. All issues as above were discussed and addressed but no physical exam was performed. If it was felt that the patient should be evaluated in the office, they were directed there. The patient verbally consented to this visit. Patient was unable to complete an audio/visual visit due to Lack of equipment. Due to the catastrophic nature of the COVID-19 pandemic, this visit was done through audio contact only. . Location of the patient: home . Location of the provider: home . Those involved with this call:  . Provider: Aura Dials, DNP . CMA: Wilhemena Durie, CMA . Front Desk/Registration: Harriet Pho  . Time spent on call: 15 minutes on the phone discussing health concerns. 10 minutes total spent in review of patient's record and preparation of their chart.  . I verified patient identity using two factors (patient name and date of birth). Patient consents verbally to being seen via telemedicine visit today.    DIABETES Continues on Metformin XR 1000 MG daily with last A1C 6.7% three months ago.  Endorses continued poor diet choices at times. Hypoglycemic episodes:no Polydipsia/polyuria: no Visual disturbance: no Chest pain: no Paresthesias: no Glucose Monitoring: yes  Accucheck frequency: Daily  Fasting glucose: he is not sure of numbers, nursing aide performs and he does not have log on hand  Post prandial:  Evening:  Before meals: Taking Insulin?: no  Long acting insulin:  Short acting insulin: Blood Pressure Monitoring: daily Retinal Examination: Not up to Date Foot Exam: Up to Date  Pneumovax: Up to Date Influenza: up to date Aspirin: yes   HYPERTENSION / HYPERLIPIDEMIA Continues on Amlodipine 10 MG, ASA, Plavix, Carvedilol 6.25 MG BID, Lisinopril 5 MG QDAY, and Lipitor 80 MG daily. Satisfied with current treatment? yes Duration of hypertension: chronic BP monitoring frequency: daily BP range: does not have on hand to read to provider BP medication side effects: no Duration of hyperlipidemia: chronic Cholesterol medication side effects: no Cholesterol supplements: none Medication compliance: good compliance Aspirin: yes Recent stressors: no Recurrent headaches: no Visual changes: no Palpitations: no Dyspnea: no Chest pain: no Lower extremity edema: no Dizzy/lightheaded: no  Relevant past medical, surgical, family and social history reviewed and updated as indicated. Interim medical history since our last visit reviewed. Allergies and medications reviewed and updated.  Review of Systems  Constitutional: Negative for activity change, diaphoresis, fatigue and fever.  Respiratory: Negative for cough, chest tightness, shortness of breath and wheezing.   Cardiovascular: Negative for chest pain, palpitations and leg swelling.  Gastrointestinal: Negative for abdominal distention, abdominal pain, constipation, diarrhea, nausea and vomiting.  Endocrine: Negative for cold intolerance, heat intolerance, polydipsia, polyphagia and polyuria.  Neurological: Negative for dizziness, syncope, weakness, light-headedness, numbness and headaches.  Psychiatric/Behavioral: Negative.     Per HPI unless specifically indicated above     Objective:    BP 118/74   Pulse 75   Temp 98.6 F (37 C) (Oral)   SpO2 97%   Wt Readings from Last 3 Encounters:  09/02/18 285 lb (129.3 kg)  08/27/18 230 lb (104.3 kg)  08/12/18 281 lb (127.5 kg)    Physical Exam   Unable to perform due to telephone only visit.  Results for orders placed or performed in visit on 12/02/18  Bayer  DCA Hb A1c Waived  Result Value Ref Range   HB A1C (BAYER DCA - WAIVED) 6.7 <7.0 %  Lipid Panel Piccolo, Waived  Result Value Ref Range   Cholesterol Piccolo, Waived 123 <200 mg/dL   HDL Chol Piccolo, Waived 59 >59 mg/dL   Triglycerides Piccolo,Waived 73 <150 mg/dL   Chol/HDL Ratio Piccolo,Waive 2.1 mg/dL   LDL Chol Calc Piccolo Waived 50 <100 mg/dL   VLDL Chol Calc Piccolo,Waive 15 <30 mg/dL  B12  Result Value Ref Range   Vitamin B-12 670 232 - 1,245 pg/mL      Assessment & Plan:   Problem List Items Addressed This Visit      Cardiovascular and Mediastinum   HTN (hypertension)    Chronic, ongoing.  BP at goal today.  Continue current medication regimen and adjust as needed at next visit.  Recommend checking BP daily at home and documenting for provider.  He has a BP cuff available at his home.  Return in 3 months for f/u.        Endocrine   Hyperlipidemia associated with type 2 diabetes mellitus (HCC)    Chronic, ongoing.  Previous labs LDL 50 and TCHOL 123.  Continue current medication regimen.  Return in 3 months will repeat lipid panel at that time.      Relevant Orders   Comprehensive metabolic panel     Nervous and Auditory   Type 2 diabetes mellitus with diabetic polyneuropathy (HCC) - Primary    Chronic, ongoing.  A1C today slightly above goal at 7.3%.  Due to history of hypoglycemia will maintain current medication regimen at this time and if continued elevation next visit consider SGLT or GLP add on.  Recommend he focus on diet, as still tends to snack a lot at home.  Continue checking BS daily at home.  Return in 3 months.      Relevant Orders   Bayer DCA Hb A1c Waived   Comprehensive metabolic panel   Microalbumin, Urine Waived      I discussed the assessment and treatment plan with the patient. The patient was provided an opportunity to ask questions and all were answered. The patient agreed with the plan and demonstrated an understanding of the instructions.    The patient was advised to call back or seek an in-person evaluation if the symptoms worsen or if the condition fails to improve as anticipated.   I provided 15 minutes of time during this encounter.  Follow up plan: Return in about 3 months (around 06/04/2019) for T2DM, HTN/HLD.

## 2019-03-04 NOTE — Assessment & Plan Note (Signed)
Chronic, ongoing.  Previous labs LDL 50 and TCHOL 123.  Continue current medication regimen.  Return in 3 months will repeat lipid panel at that time.

## 2019-03-04 NOTE — Patient Instructions (Signed)
Carbohydrate Counting for Diabetes Mellitus, Adult  Carbohydrate counting is a method of keeping track of how many carbohydrates you eat. Eating carbohydrates naturally increases the amount of sugar (glucose) in the blood. Counting how many carbohydrates you eat helps keep your blood glucose within normal limits, which helps you manage your diabetes (diabetes mellitus). It is important to know how many carbohydrates you can safely have in each meal. This is different for every person. A diet and nutrition specialist (registered dietitian) can help you make a meal plan and calculate how many carbohydrates you should have at each meal and snack. Carbohydrates are found in the following foods:  Grains, such as breads and cereals.  Dried beans and soy products.  Starchy vegetables, such as potatoes, peas, and corn.  Fruit and fruit juices.  Milk and yogurt.  Sweets and snack foods, such as cake, cookies, candy, chips, and soft drinks. How do I count carbohydrates? There are two ways to count carbohydrates in food. You can use either of the methods or a combination of both. Reading "Nutrition Facts" on packaged food The "Nutrition Facts" list is included on the labels of almost all packaged foods and beverages in the [place] It includes:  The serving size.  Information about nutrients in each serving, including the grams (g) of carbohydrate per serving. To use the "Nutrition Facts":  Decide how many servings you will have.  Multiply the number of servings by the number of carbohydrates per serving.  The resulting number is the total amount of carbohydrates that you will be having. Learning standard serving sizes of other foods When you eat carbohydrate foods that are not packaged or do not include "Nutrition Facts" on the label, you need to measure the servings in order to count the amount of carbohydrates:  Measure the foods that you will eat with a food scale or measuring cup, if needed.   Decide how many standard-size servings you will eat.  Multiply the number of servings by 15. Most carbohydrate-rich foods have about 15 g of carbohydrates per serving. ? For example, if you eat 8 oz (170 g) of strawberries, you will have eaten 2 servings and 30 g of carbohydrates (2 servings x 15 g = 30 g).  For foods that have more than one food mixed, such as soups and casseroles, you must count the carbohydrates in each food that is included. The following list contains standard serving sizes of common carbohydrate-rich foods. Each of these servings has about 15 g of carbohydrates:   hamburger bun or  English muffin.   oz (15 mL) syrup.   oz (14 g) jelly.  1 slice of bread.  1 six-inch tortilla.  3 oz (85 g) cooked rice or pasta.  4 oz (113 g) cooked dried beans.  4 oz (113 g) starchy vegetable, such as peas, corn, or potatoes.  4 oz (113 g) hot cereal.  4 oz (113 g) mashed potatoes or  of a large baked potato.  4 oz (113 g) canned or frozen fruit.  4 oz (120 mL) fruit juice.  4-6 crackers.  6 chicken nuggets.  6 oz (170 g) unsweetened dry cereal.  6 oz (170 g) plain fat-free yogurt or yogurt sweetened with artificial sweeteners.  8 oz (240 mL) milk.  8 oz (170 g) fresh fruit or one small piece of fruit.  24 oz (680 g) popped popcorn. Example of carbohydrate counting Sample meal  3 oz (85 g) chicken breast.  6 oz (170 g)   brown rice.  4 oz (113 g) corn.  8 oz (240 mL) milk.  8 oz (170 g) strawberries with sugar-free whipped topping. Carbohydrate calculation 1. Identify the foods that contain carbohydrates: ? Rice. ? Corn. ? Milk. ? Strawberries. 2. Calculate how many servings you have of each food: ? 2 servings rice. ? 1 serving corn. ? 1 serving milk. ? 1 serving strawberries. 3. Multiply each number of servings by 15 g: ? 2 servings rice x 15 g = 30 g. ? 1 serving corn x 15 g = 15 g. ? 1 serving milk x 15 g = 15 g. ? 1 serving  strawberries x 15 g = 15 g. 4. Add together all of the amounts to find the total grams of carbohydrates eaten: ? 30 g + 15 g + 15 g + 15 g = 75 g of carbohydrates total. Summary  Carbohydrate counting is a method of keeping track of how many carbohydrates you eat.  Eating carbohydrates naturally increases the amount of sugar (glucose) in the blood.  Counting how many carbohydrates you eat helps keep your blood glucose within normal limits, which helps you manage your diabetes.  A diet and nutrition specialist (registered dietitian) can help you make a meal plan and calculate how many carbohydrates you should have at each meal and snack. This information is not intended to replace advice given to you by your health care provider. Make sure you discuss any questions you have with your health care provider. Document Released: 06/24/2005 Document Revised: 01/16/2017 Document Reviewed: 12/06/2015 Elsevier Patient Education  2020 Elsevier Inc.  

## 2019-03-05 DIAGNOSIS — E1142 Type 2 diabetes mellitus with diabetic polyneuropathy: Secondary | ICD-10-CM | POA: Diagnosis not present

## 2019-03-05 LAB — COMPREHENSIVE METABOLIC PANEL
ALT: 15 IU/L (ref 0–44)
AST: 15 IU/L (ref 0–40)
Albumin/Globulin Ratio: 2.2 (ref 1.2–2.2)
Albumin: 4.8 g/dL (ref 4.0–5.0)
Alkaline Phosphatase: 76 IU/L (ref 39–117)
BUN/Creatinine Ratio: 11 (ref 9–20)
BUN: 12 mg/dL (ref 6–24)
Bilirubin Total: 0.4 mg/dL (ref 0.0–1.2)
CO2: 22 mmol/L (ref 20–29)
Calcium: 9.7 mg/dL (ref 8.7–10.2)
Chloride: 101 mmol/L (ref 96–106)
Creatinine, Ser: 1.11 mg/dL (ref 0.76–1.27)
GFR calc Af Amer: 92 mL/min/{1.73_m2} (ref >59)
GFR calc non Af Amer: 79 mL/min/{1.73_m2} (ref >59)
Globulin, Total: 2.2 g/dL (ref 1.5–4.5)
Glucose: 153 mg/dL — ABNORMAL HIGH (ref 65–99)
Potassium: 4.5 mmol/L (ref 3.5–5.2)
Sodium: 142 mmol/L (ref 134–144)
Total Protein: 7 g/dL (ref 6.0–8.5)

## 2019-03-05 LAB — MICROALBUMIN, URINE WAIVED
Creatinine, Urine Waived: 200 mg/dL (ref 10–300)
Microalb, Ur Waived: 150 mg/L — ABNORMAL HIGH (ref 0–19)

## 2019-03-05 NOTE — Addendum Note (Signed)
Addended by: Georgina Peer on: 03/05/2019 01:42 PM   Modules accepted: Orders

## 2019-03-05 NOTE — Progress Notes (Signed)
Normal test results noted.  Please call patient and make them aware of normal results and will continue to monitor at regular visits.  Have a great day.  Look forward to seeing you at your next visit.

## 2019-03-17 ENCOUNTER — Other Ambulatory Visit: Payer: Self-pay | Admitting: Nurse Practitioner

## 2019-03-17 ENCOUNTER — Telehealth: Payer: Self-pay

## 2019-03-30 ENCOUNTER — Ambulatory Visit: Payer: Medicare Other | Admitting: *Deleted

## 2019-03-30 DIAGNOSIS — Z8673 Personal history of transient ischemic attack (TIA), and cerebral infarction without residual deficits: Secondary | ICD-10-CM

## 2019-03-30 DIAGNOSIS — G918 Other hydrocephalus: Secondary | ICD-10-CM

## 2019-03-30 NOTE — Chronic Care Management (AMB) (Signed)
Chronic Care Management   Follow Up Note   03/30/2019 Name: [name] MRN: [id number] DOB: [birth year]  Referred by: Patrick Lick, NP Reason for referral : Care Coordination (Neurology appt)   [name] is a 48 y.o. year old male who is a primary care patient of [name], Patrick Faster, NP. The CCM team was consulted for assistance with chronic disease management and care coordination needs.    Review of patient status, including review of consultants reports, relevant laboratory and other test results, and collaboration with appropriate care team members and the patient's provider was performed as part of comprehensive patient evaluation and provision of chronic care management services.    SDOH (Social Determinants of Health) screening performed today: None. See Care Plan for related entries.   Advanced Directives Status: N See Care Plan and Vynca application for related entries.  Outpatient Encounter Medications as of 03/30/2019  Medication Sig   amLODipine (NORVASC) 10 MG tablet Take 1 tablet (10 mg total) by mouth daily.   aspirin 81 MG chewable tablet Chew 1 tablet (81 mg total) by mouth daily.   atorvastatin (LIPITOR) 80 MG tablet Take 1 tablet (80 mg total) by mouth daily at 6 PM.   blood glucose meter kit and supplies KIT Dispense based on patient and insurance preference. Use up to four times daily as directed. (FOR ICD-9 250.00, 250.01).   carvedilol (COREG) 6.25 MG tablet Take 1 tablet (6.25 mg total) by mouth 2 (two) times daily with a meal.   citalopram (CELEXA) 10 MG tablet Take 1 tablet (10 mg total) by mouth daily.   clopidogrel (PLAVIX) 75 MG tablet Take 1 tablet (75 mg total) by mouth daily with breakfast.   gabapentin (NEURONTIN) 300 MG capsule Take 1 capsule (300 mg total) by mouth 3 (three) times daily.   glucose blood (ACCU-CHEK AVIVA) test strip Use as instructed   Lancet Devices (ACCU-CHEK SOFTCLIX) lancets Use as instructed   lisinopril  (ZESTRIL) 5 MG tablet Take 1 tablet (5 mg total) by mouth daily.   metFORMIN (GLUCOPHAGE-XR) 500 MG 24 hr tablet TAKE 2 TABLETS BY MOUTH ONCE DAILY WITH BREAKFAST   vitamin B-12 (CYANOCOBALAMIN) 1000 MCG tablet Take 1 tablet (1,000 mcg total) by mouth daily.   No facility-administered encounter medications on file as of 03/30/2019.      Goals Addressed            This Visit's Progress    Needs assistance getting in with Neurology (pt-stated)       Current Barriers:   Cognitive Deficits  Nurse Case Manager Clinical Goal(s):   Over the next 30 days, patient will work with [facility] to address needs related to seeing Neurologist  Interventions:   Collaborated with PCP regarding patient's need for Neurology Follow up with Dr. Gurney Maxin.   Reviewed scheduled/upcoming provider appointments including: [facility] Neurology, Gurney Maxin MD Oct 15 @ 12:30 (Left vm for [name] making hime aware of appointment)   Placed a call to patient's Payee/caregiver [name] about neurology appt, he preferred for me to make the appointment for [name] between 10am and 2pm and let him know.   Placed a call to [facility] Neurology dept to schedule appointment, was able to make appt for Oct 15, @ 12:30(Also gave [facility] patient's new address)   [name] stated patient has obtained foot brace and new glasses.    Patient Self Care Activities:   Currently UNABLE TO independently make follow up neurology appt  Please see past updates related to this goal by clicking on the "Past Updates" button in the selected goal          The care management team will reach out to the patient again over the next 30 days.  The patient has been provided with contact information for the care management team and has been advised to call with any health related questions or concerns.    Merlene Morse Taunja Brickner RN, BSN Nurse Case Editor, [facility] Care [facility]  [phone]) Business Mobile

## 2019-03-30 NOTE — Patient Instructions (Signed)
Thank you allowing the Chronic Care Management Team to be a part of your care! It was a pleasure speaking with you today!   CCM (Chronic Care Management) Team   Tariq Pernell RN, BSN Nurse Care Coordinator  [phone]  [facility] PharmD  Clinical Pharmacist  [phone]  Eula Fried LCSW Clinical Social Worker [phone]  Goals Addressed            This Visit's Progress   . Needs assistance getting in with Neurology (pt-stated)       Current Barriers:  . Cognitive Deficits  Nurse Case Manager Clinical Goal(s):  Marland Kitchen Over the next 30 days, patient will work with [facility] to address needs related to seeing Neurologist  Interventions:  . Collaborated with PCP regarding patient's need for Neurology Follow up with Dr. Gurney Maxin.  . Reviewed scheduled/upcoming provider appointments including: [facility] Neurology, Gurney Maxin MD Oct 15 @ 12:30 (Left vm for [name] making hime aware of appointment)  . Placed a call to patient's Payee/caregiver [name] about neurology appt, he preferred for me to make the appointment for Mr. Yielding between 10am and 2pm and let him know.  . Placed a call to [facility] Neurology dept to schedule appointment, was able to make appt for Oct 15, @ 12:30 . [name] stated patient has obtained foot brace and new glasses.    Patient Self Care Activities:  . Currently UNABLE TO independently make follow up neurology appt  Please see past updates related to this goal by clicking on the "Past Updates" button in the selected goal         The patient verbalized understanding of instructions provided today and declined a print copy of patient instruction materials.   The patient has been provided with contact information for the care management team and has been advised to call with any health related questions or concerns.

## 2019-04-09 ENCOUNTER — Telehealth: Payer: Self-pay

## 2019-04-12 ENCOUNTER — Other Ambulatory Visit: Payer: Self-pay | Admitting: Nurse Practitioner

## 2019-04-22 DIAGNOSIS — G910 Communicating hydrocephalus: Secondary | ICD-10-CM | POA: Diagnosis not present

## 2019-05-04 ENCOUNTER — Telehealth: Payer: Self-pay

## 2019-05-10 ENCOUNTER — Ambulatory Visit: Payer: Self-pay | Admitting: Licensed Clinical Social Worker

## 2019-05-10 ENCOUNTER — Telehealth: Payer: Self-pay

## 2019-05-10 NOTE — Chronic Care Management (AMB) (Signed)
  Care Management   Follow Up Note   05/10/2019 Name: KIMI KROFT MRN: [id number] DOB: 11-27-1970  Referred by: Venita Lick, NP Reason for referral : Care Coordination   SOLMON BOHR is a 48 y.o. year old male who is a primary care patient of Cannady, Barbaraann Faster, NP. The care management team was consulted for assistance with care management and care coordination needs.    Review of patient status, including review of consultants reports, relevant laboratory and other test results, and collaboration with appropriate care team members and the patient's provider was performed as part of comprehensive patient evaluation and provision of chronic care management services.    LCSW completed CCM outreach attempt today but was unable to reach patient successfully. A HIPPA compliant voice message was left encouraging patient to return call once available. LCSW rescheduled CCM SW appointment as well.  A HIPPA compliant phone message was left for the patient providing contact information and requesting a return call.   Eula Fried, BSW, MSW, [facility] Care Management [email or web address] Phone: [phone]

## 2019-05-11 ENCOUNTER — Other Ambulatory Visit: Payer: Self-pay | Admitting: Nurse Practitioner

## 2019-06-07 ENCOUNTER — Ambulatory Visit: Payer: Medicare Other | Admitting: Nurse Practitioner

## 2019-06-14 ENCOUNTER — Telehealth: Payer: Self-pay | Admitting: Nurse Practitioner

## 2019-06-14 ENCOUNTER — Encounter: Payer: Self-pay | Admitting: Nurse Practitioner

## 2019-06-14 NOTE — Telephone Encounter (Signed)
Called Pt no answer. Sent letter.  °

## 2019-06-30 ENCOUNTER — Other Ambulatory Visit: Payer: Self-pay | Admitting: Nurse Practitioner

## 2019-06-30 NOTE — Telephone Encounter (Signed)
Requested Prescriptions  Pending Prescriptions Disp Refills  . amLODipine (NORVASC) 10 MG tablet [Pharmacy Med Name: AMLODIPINE BESYLATE 10 MG TAB] 30 tablet 1    Sig: TAKE 1 TABLET BY MOUTH ONCE DAILY     Cardiovascular:  Calcium Channel Blockers Passed - 06/30/2019 10:54 AM      Passed - Last BP in normal range    BP Readings from Last 1 Encounters:  03/04/19 118/74         Passed - Valid encounter within last 6 months    Recent Outpatient Visits          3 months ago Type 2 diabetes mellitus with diabetic polyneuropathy, without long-term current use of insulin ([place])   [place], [name] T, NP   7 months ago Type 2 diabetes mellitus with diabetic polyneuropathy, without long-term current use of insulin ([place])   [place], [name] T, NP   10 months ago Uncontrolled type 2 diabetes mellitus with peripheral neuropathy ([place])   [place] Cannady, Barbaraann Faster, NP   10 months ago Flu vaccine need   [employer], Henrine Screws T, NP   3 years ago Erroneous encounter - disregard   [facility] Roselee Nova, MD

## 2019-07-07 ENCOUNTER — Telehealth: Payer: Self-pay

## 2019-07-12 ENCOUNTER — Ambulatory Visit: Payer: Self-pay | Admitting: Licensed Clinical Social Worker

## 2019-07-13 NOTE — Chronic Care Management (AMB) (Signed)
  Care Management   Follow Up Note   07/13/2019 Name: [name] MRN: [id number] DOB: 11/23/1970  Referred by: [name], NP Reason for referral : Care Coordination   [name] is a 49 y.o. year old male who is a primary care patient of Cannady, Dorie Rank, NP. The care management team was consulted for assistance with care management and care coordination needs.    Review of patient status, including review of consultants reports, relevant laboratory and other test results, and collaboration with appropriate care team members and the patient's provider was performed as part of comprehensive patient evaluation and provision of chronic care management services.    LCSW completed CCM outreach attempt today but was unable to reach patient successfully. A HIPPA compliant voice message was left encouraging patient to return call once available. LCSW rescheduled CCM SW appointment as well.  A HIPPA compliant phone message was left for the patient providing contact information and requesting a return call.   Dickie La, BSW, MSW, LCSW [facility] Care Management [place]  [facility] [email or web address] Phone: [phone]

## 2019-07-30 ENCOUNTER — Other Ambulatory Visit: Payer: Self-pay | Admitting: Nurse Practitioner

## 2019-07-30 NOTE — Telephone Encounter (Signed)
Forwarding medication refill requests to PCP for review. Refills: 0

## 2019-08-17 ENCOUNTER — Telehealth: Payer: Self-pay

## 2019-08-30 ENCOUNTER — Other Ambulatory Visit: Payer: Self-pay | Admitting: Nurse Practitioner

## 2019-08-30 NOTE — Telephone Encounter (Signed)
LOV 03/04/19

## 2019-08-30 NOTE — Telephone Encounter (Signed)
Requested medications are due for refill today?  Yes  Requested medications are on active medication list?  Yes  Last Refill:   07/30/2019 # 60 with no refills  Future visit scheduled? No  Notes to Clinic:   Patient is past due to appointment in office.  Patient was supposed to f/u in 3 months.  This was 5 months ago.  One courtesy refill already provided in January 2021 as indicated above.  Attempted to reach patient by phone to schedule appt.    Left VM for him to call office to schedule appt.

## 2019-08-30 NOTE — Telephone Encounter (Signed)
Called patient to schedule a follow up. Left detailed message (DPR reviewed) for patient to return call to schedule a f/u

## 2019-09-03 ENCOUNTER — Telehealth: Payer: Self-pay

## 2019-09-07 ENCOUNTER — Telehealth: Payer: Self-pay | Admitting: Nurse Practitioner

## 2019-09-07 ENCOUNTER — Telehealth: Payer: Medicare Other | Admitting: General Practice

## 2019-09-07 ENCOUNTER — Ambulatory Visit (INDEPENDENT_AMBULATORY_CARE_PROVIDER_SITE_OTHER): Payer: Medicare Other | Admitting: General Practice

## 2019-09-07 DIAGNOSIS — E1169 Type 2 diabetes mellitus with other specified complication: Secondary | ICD-10-CM

## 2019-09-07 DIAGNOSIS — G918 Other hydrocephalus: Secondary | ICD-10-CM

## 2019-09-07 DIAGNOSIS — E1165 Type 2 diabetes mellitus with hyperglycemia: Secondary | ICD-10-CM

## 2019-09-07 DIAGNOSIS — E1142 Type 2 diabetes mellitus with diabetic polyneuropathy: Secondary | ICD-10-CM

## 2019-09-07 DIAGNOSIS — E785 Hyperlipidemia, unspecified: Secondary | ICD-10-CM | POA: Diagnosis not present

## 2019-09-07 DIAGNOSIS — I10 Essential (primary) hypertension: Secondary | ICD-10-CM

## 2019-09-07 DIAGNOSIS — IMO0002 Reserved for concepts with insufficient information to code with codable children: Secondary | ICD-10-CM
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
## 2019-09-07 NOTE — Telephone Encounter (Signed)
Lvm to make appointment.

## 2019-09-07 NOTE — Telephone Encounter (Signed)
The patient needs to have an appointment to be seen by pcp [name].  For scheduling of appointment please call advocate: Clare Gandy at [phone]. Thank you

## 2019-09-08 NOTE — Telephone Encounter (Signed)
Lvm to make apt.  

## 2019-09-09 NOTE — Telephone Encounter (Signed)
Called pt, no answer.

## 2019-09-10 NOTE — Telephone Encounter (Signed)
Did you call patient or [name] his advocate below number [phone]?  If unable to get patient call [name] and leave message, he should get back.  If no response then send letter please.  Thank you.

## 2019-09-13 ENCOUNTER — Encounter: Payer: Self-pay | Admitting: Nurse Practitioner

## 2019-09-13 NOTE — Telephone Encounter (Signed)
Attempted to call [name] but no answer, mailing letter.

## 2019-09-16 ENCOUNTER — Telehealth: Payer: Self-pay | Admitting: Nurse Practitioner

## 2019-09-16 NOTE — Telephone Encounter (Signed)
   09/16/2019  Name: [name]   MRN: [id number]   DOB: 1971/04/10   AGE: 49 y.o.   GENDER: male   PCP [name], NP.   Called pt's advocate Clare Gandy regarding [employer] Referral for Home Care and Food LMTCB  [name]  Care Guide . Embedded Care Coordination [facility] Management [name]  [phone]

## 2019-09-17 NOTE — Telephone Encounter (Signed)
Email to Clare Gandy  From: [name] [facility])  Sent: Friday, September 17, 2019 3:38 PM To: [email or web address] Subject: SECURE: [place] [employer]  Good Afternoon Mr. Compton , Please see attached list of food banks and the meal calendar through [employer].   Please let me know if you need anything further,  [name]  Care Guide . Embedded Care Coordination [place]  Care Management ??[name]  ??[phone]

## 2019-09-17 NOTE — Telephone Encounter (Signed)
° °  09/17/2019   Name: [name]    MRN: [id number]    DOB: 1971-03-15    AGE: 49 y.o.    GENDER: male    PCP [name], NP.   Called pt's advocate and caregiver [name] regarding [employer] Referral for [employer] and Home Care. He stated that they have [facility] [place] home care set up for [name] and does not need assistance. He did confirm that both [name] and his brother [name] 47 y/o) could use assistance with food (meal delivery, groceries)  CG will email list of food pantrys and [employer] meal calender to pt advocate [name] CG will submit referral to [employer] for meals for both Mr. Lewter and his brother CG will submit application to [facility] to help assist with groceries and food lion gift card.  [name]  Care Guide  Embedded Care Coordination [facility] Management [name]   [phone]

## 2019-09-17 NOTE — Telephone Encounter (Signed)
Email to [facility]  From: [name] [facility])  Sent: Friday, September 17, 2019 3:32 PM To: Carlyle Basques [email or web address]> Subject: ECC Application -[name] - 09/17/19  Good Afternoon Arline Asp,  Please see attached application for [name]. I have updated the [employer].  Thank you and let me know if you have any questions,   [name]  Care Guide . Embedded Care Coordination [place]  Care Management ??[name]  ??[phone]

## 2019-09-17 NOTE — Telephone Encounter (Signed)
Mr. Charlott Holler returning call best # [phone]

## 2019-09-17 NOTE — Telephone Encounter (Signed)
Email to [employer]   From: [name] [facility]  Sent: Friday, September 17, 2019 3:26 PM To: '[email or web address]> Subject: Secure: Clarisa Fling Kitchen Referral - [name]. [name]- 09/17/19  Good [name],  [name] is a patient at [facility] and is 49 y/o and lives with his brother [name] who is 4 y/o. [name] in an electric wheelchair and has had a couple of strokes he could use some assistance with meals. Neither brother is eligible for food stamps.   This referral is for [name] and his brother [name] for meals. No dietary restrictions.  I have sent a list of food pantries and the [employer] Calendar to their family caregiver [name]. Please call him directly at number listed below if you have any questions.  Here is the contact information:  [name]  [address], [name] [place], [place] [address]  Aug 22, 1970 [phone] [name]) [name] is a friend and is helping assist these brothers.  Thank you [name], and please let me know if you need anything further!   [name]  Care Guide . Embedded Care Coordination [place]  Care Management ??[name]  ??[phone]

## 2019-09-17 NOTE — Telephone Encounter (Signed)
09/17/2019 2nd attempt LMTCB knb

## 2019-09-20 NOTE — Telephone Encounter (Signed)
   09/20/2019  Name: [name]   MRN: [id number]   DOB: [birth year]   AGE: 49 y.o.   GENDER: male   PCP [name], NP.   Called pt regarding Publishing rights manager Referral for grocery gift card pick up and directions to [facility]. LMTCB  [employer]  Care Guide . Embedded Care Coordination [facility] Management [name]  [phone]

## 2019-09-20 NOTE — Telephone Encounter (Signed)
Email to [facility]   From: [name] [facility])  Sent: Monday, September 20, 2019 4:32 PM To: [place] Morris Wood ([email or web address]) [email or web address]> Subject: Pt [name] - Caregiver Clare Gandy  Hey [place], [name] just called me back he stated he can come by pretty much anytime tomorrow to pick up the gift card. Could you please call him and give him a time that suits you?  Thanks so much! [phone] [name]) Clare Gandy is a friend and is helping assist these brothers.    [name]  Care Guide . Embedded Care Coordination [place]  Care Management ??[name]  ??[phone]

## 2019-09-24 ENCOUNTER — Other Ambulatory Visit: Payer: Self-pay | Admitting: Nurse Practitioner

## 2019-09-24 ENCOUNTER — Other Ambulatory Visit: Payer: Self-pay

## 2019-09-24 MED ORDER — METFORMIN HCL ER 500 MG PO TB24: 500.0000 mg | ORAL_TABLET | Freq: Two times a day (BID) | ORAL | 0 refills | Status: DC

## 2019-09-24 NOTE — Telephone Encounter (Signed)
Patient has upcoming appointment 09/29/19

## 2019-09-29 ENCOUNTER — Ambulatory Visit: Payer: Medicare Other | Admitting: Nurse Practitioner

## 2019-10-01 ENCOUNTER — Other Ambulatory Visit: Payer: Self-pay

## 2019-10-01 ENCOUNTER — Ambulatory Visit: Payer: Medicare Other | Admitting: Nurse Practitioner

## 2019-10-01 ENCOUNTER — Ambulatory Visit (INDEPENDENT_AMBULATORY_CARE_PROVIDER_SITE_OTHER): Payer: Medicare Other | Admitting: Nurse Practitioner

## 2019-10-01 ENCOUNTER — Encounter: Payer: Self-pay | Admitting: Nurse Practitioner

## 2019-10-01 VITALS — BP 132/80 | HR 80 | Temp 99.2°F

## 2019-10-01 DIAGNOSIS — I10 Essential (primary) hypertension: Secondary | ICD-10-CM | POA: Diagnosis not present

## 2019-10-01 DIAGNOSIS — E785 Hyperlipidemia, unspecified: Secondary | ICD-10-CM | POA: Diagnosis not present

## 2019-10-01 DIAGNOSIS — E538 Deficiency of other specified B group vitamins: Secondary | ICD-10-CM | POA: Diagnosis not present

## 2019-10-01 DIAGNOSIS — Z6838 Body mass index (BMI) 38.0-38.9, adult: Secondary | ICD-10-CM | POA: Diagnosis not present

## 2019-10-01 DIAGNOSIS — E1142 Type 2 diabetes mellitus with diabetic polyneuropathy: Secondary | ICD-10-CM | POA: Diagnosis not present

## 2019-10-01 DIAGNOSIS — E1159 Type 2 diabetes mellitus with other circulatory complications: Secondary | ICD-10-CM

## 2019-10-01 DIAGNOSIS — E1169 Type 2 diabetes mellitus with other specified complication: Secondary | ICD-10-CM | POA: Diagnosis not present

## 2019-10-01 LAB — BAYER DCA HB A1C WAIVED: HB A1C (BAYER DCA - WAIVED): 8.6 % — ABNORMAL HIGH (ref <7.0)

## 2019-10-01 MED ORDER — METFORMIN HCL ER 500 MG PO TB24: 1000.0000 mg | ORAL_TABLET | Freq: Two times a day (BID) | ORAL | 3 refills | Status: DC

## 2019-10-01 MED ORDER — CITALOPRAM HYDROBROMIDE 10 MG PO TABS: 10.0000 mg | ORAL_TABLET | Freq: Every day | ORAL | 3 refills | Status: DC

## 2019-10-01 MED ORDER — METFORMIN HCL ER 500 MG PO TB24: 500.0000 mg | ORAL_TABLET | Freq: Two times a day (BID) | ORAL | 3 refills | Status: DC

## 2019-10-01 MED ORDER — ATORVASTATIN CALCIUM 80 MG PO TABS: ORAL_TABLET | ORAL | 3 refills | Status: DC

## 2019-10-01 MED ORDER — LISINOPRIL 5 MG PO TABS: 5.0000 mg | ORAL_TABLET | Freq: Every day | ORAL | 3 refills | Status: DC

## 2019-10-01 MED ORDER — CARVEDILOL 6.25 MG PO TABS: ORAL_TABLET | ORAL | 3 refills | Status: DC

## 2019-10-01 MED ORDER — GABAPENTIN 300 MG PO CAPS: 300.0000 mg | ORAL_CAPSULE | Freq: Three times a day (TID) | ORAL | 3 refills | Status: DC

## 2019-10-01 NOTE — Assessment & Plan Note (Signed)
Chronic, ongoing.  BP at goal today on recheck today.  Continue current medication regimen and adjust as needed at next visit.  Recommend checking BP daily at home and documenting for provider.  He has a BP cuff available at his home.  Return in 3 months for f/u.

## 2019-10-01 NOTE — Assessment & Plan Note (Signed)
Chronic, ongoing.  Previous labs LDL 50 and TCHOL 123.  Continue current medication regimen and adjust as needed.  Return in 3 months.

## 2019-10-01 NOTE — Progress Notes (Signed)
BP 132/80 (BP Location: Left Arm)   Pulse 80   Temp 99.2 F (37.3 C) (Oral)   SpO2 96%    Subjective:    Patient ID: [name], male    DOB: 09-Mar-1971, 49 y.o.   MRN: [id number]  HPI: [name] is a 49 y.o. male  Chief Complaint  Patient presents with  . Diabetes   His brother is at bedside with him today.  DIABETES Continues on Metformin XR 1000 MG daily with last A1C 7.3% in August, has missed some appointments since this time.  Endorses continued poor diet choices at times.  There was concern from friend reported to CCM team that patient was drinking, patient lives with his brother.  He denies heavy alcohol intake and reports only occasional, 1-2 times one drink a week.  History of B12 deficiency with last level improved with supplement. Hypoglycemic episodes:no Polydipsia/polyuria: no Visual disturbance: no Chest pain: no Paresthesias: no Glucose Monitoring: yes  Accucheck frequency: Daily  Fasting glucose: highest 120  Post prandial:  Evening:  Before meals: Taking Insulin?: no  Long acting insulin:  Short acting insulin: Blood Pressure Monitoring: daily Retinal Examination: Not up to Date Foot Exam: Up to Date Pneumovax: Up to Date Influenza: up to date Aspirin: yes   HYPERTENSION / HYPERLIPIDEMIA Continues on Amlodipine 10 MG, ASA, Plavix, Carvedilol 6.25 MG BID, Lisinopril 5 MG QDAY, and Lipitor 80 MG daily. Satisfied with current treatment? yes Duration of hypertension: chronic BP monitoring frequency: not checking BP range: not checking BP medication side effects: no Duration of hyperlipidemia: chronic Cholesterol medication side effects: no Cholesterol supplements: none Medication compliance: good compliance Aspirin: yes Recent stressors: no Recurrent headaches: no Visual changes: no Palpitations: no Dyspnea: no Chest pain: no Lower extremity edema: no Dizzy/lightheaded: no   DEPRESSION Continues on Celexa 10 MG daily. Mood  status: stable Satisfied with current treatment?: yes Symptom severity: mild  Duration of current treatment : chronic Side effects: no Medication compliance: good compliance Psychotherapy/counseling: none Previous psychiatric medications: none Depressed mood: no Anxious mood: no Anhedonia: no Significant weight loss or gain: no Insomnia: none Fatigue: no Feelings of worthlessness or guilt: no Impaired concentration/indecisiveness: no Suicidal ideations: no Hopelessness: no Crying spells: no Depression screen [facility] 2/9 10/01/2019 09/02/2018 12/28/2015 12/18/2015 10/16/2015  Decreased Interest 0 2 0 0 0  Down, Depressed, Hopeless 0 1 0 0 0  PHQ - 2 Score 0 3 0 0 0  Altered sleeping 0 1 - - -  Tired, decreased energy 0 1 - - -  Change in appetite 0 1 - - -  Feeling bad or failure about yourself  0 1 - - -  Trouble concentrating 0 2 - - -  Moving slowly or fidgety/restless 0 1 - - -  Suicidal thoughts 0 0 - - -  PHQ-9 Score 0 10 - - -  Difficult doing work/chores Not difficult at all Not difficult at all - - -   GAD 7 : Generalized Anxiety Score 10/01/2019  Nervous, Anxious, on Edge 0  Control/stop worrying 0  Worry too much - different things 0  Trouble relaxing 0  Restless 0  Easily annoyed or irritable 0  Afraid - awful might happen 0  Total GAD 7 Score 0    Relevant past medical, surgical, family and social history reviewed and updated as indicated. Interim medical history since our last visit reviewed. Allergies and medications reviewed and updated.  Review of Systems  Constitutional: Negative for  activity change, diaphoresis, fatigue and fever.  Respiratory: Negative for cough, chest tightness, shortness of breath and wheezing.   Cardiovascular: Negative for chest pain, palpitations and leg swelling.  Gastrointestinal: Negative for abdominal distention, abdominal pain, constipation, diarrhea, nausea and vomiting.  Endocrine: Negative for cold intolerance, heat  intolerance, polydipsia, polyphagia and polyuria.  Neurological: Negative for dizziness, syncope, weakness, light-headedness, numbness and headaches.  Psychiatric/Behavioral: Negative.     Per HPI unless specifically indicated above     Objective:    BP 132/80 (BP Location: Left Arm)   Pulse 80   Temp 99.2 F (37.3 C) (Oral)   SpO2 96%   Wt Readings from Last 3 Encounters:  09/02/18 285 lb (129.3 kg)  08/27/18 230 lb (104.3 kg)  08/12/18 281 lb (127.5 kg)    Physical Exam Vitals and nursing note reviewed.  Constitutional:      General: He is awake. He is not in acute distress.    Appearance: He is well-developed. He is obese. He is not ill-appearing.  HENT:     Head: Normocephalic and atraumatic.     Right Ear: Hearing normal. No drainage.     Left Ear: Hearing normal. No drainage.  Eyes:     General: Lids are normal.        Right eye: No discharge.        Left eye: No discharge.     Conjunctiva/sclera: Conjunctivae normal.     Pupils: Pupils are equal, round, and reactive to light.  Neck:     Thyroid: No thyromegaly.     Vascular: No carotid bruit.  Cardiovascular:     Rate and Rhythm: Normal rate and regular rhythm.     Heart sounds: Normal heart sounds, S1 normal and S2 normal. No murmur. No gallop.   Pulmonary:     Effort: Pulmonary effort is normal. No accessory muscle usage or respiratory distress.     Breath sounds: Normal breath sounds.  Abdominal:     General: Bowel sounds are normal.     Palpations: Abdomen is soft.  Musculoskeletal:        General: Normal range of motion.     Cervical back: Normal range of motion and neck supple.     Right lower leg: No edema.     Left lower leg: No edema.  Skin:    General: Skin is warm and dry.     Capillary Refill: Capillary refill takes less than 2 seconds.  Neurological:     Mental Status: He is alert and oriented to person, place, and time.  Psychiatric:        Attention and Perception: Attention normal.          Mood and Affect: Mood normal.        Speech: Speech normal.        Behavior: Behavior normal. Behavior is cooperative.     Results for orders placed or performed in visit on 03/04/19  Bayer DCA Hb A1c Waived  Result Value Ref Range   HB A1C (BAYER DCA - WAIVED) 7.3 (H) <7.0 %  Comprehensive metabolic panel  Result Value Ref Range   Glucose 153 (H) 65 - 99 mg/dL   BUN 12 6 - 24 mg/dL   Creatinine, Ser 0.99 0.76 - 1.27 mg/dL   GFR calc non Af Amer 79 >59 mL/min/1.73   GFR calc Af Amer 92 >59 mL/min/1.73   BUN/Creatinine Ratio 11 9 - 20   Sodium 142 134 - 144 mmol/L  Potassium 4.5 3.5 - 5.2 mmol/L   Chloride 101 96 - 106 mmol/L   CO2 22 20 - 29 mmol/L   Calcium 9.7 8.7 - 10.2 mg/dL   Total Protein 7.0 6.0 - 8.5 g/dL   Albumin 4.8 4.0 - 5.0 g/dL   Globulin, Total 2.2 1.5 - 4.5 g/dL   Albumin/Globulin Ratio 2.2 1.2 - 2.2   Bilirubin Total 0.4 0.0 - 1.2 mg/dL   Alkaline Phosphatase 76 39 - 117 IU/L   AST 15 0 - 40 IU/L   ALT 15 0 - 44 IU/L  Microalbumin, Urine Waived  Result Value Ref Range   Microalb, Ur Waived 150 (H) 0 - 19 mg/L   Creatinine, Urine Waived 200 10 - 300 mg/dL   Microalb/Creat Ratio 30-300 (H) <30 mg/g      Assessment & Plan:   Problem List Items Addressed This Visit      Cardiovascular and Mediastinum   Hypertension associated with diabetes (HCC)    Chronic, ongoing.  BP at goal today on recheck today.  Continue current medication regimen and adjust as needed at next visit.  Recommend checking BP daily at home and documenting for provider.  He has a BP cuff available at his home.  Return in 3 months for f/u.      Relevant Medications   lisinopril (ZESTRIL) 5 MG tablet   carvedilol (COREG) 6.25 MG tablet   atorvastatin (LIPITOR) 80 MG tablet   metFORMIN (GLUCOPHAGE-XR) 500 MG 24 hr tablet   Other Relevant Orders   Bayer DCA Hb A1c Waived   Basic metabolic panel   Microalbumin, Urine Waived     Endocrine   Hyperlipidemia associated with type 2  diabetes mellitus (HCC)    Chronic, ongoing.  Previous labs LDL 50 and TCHOL 123.  Continue current medication regimen and adjust as needed.  Return in 3 months.      Relevant Medications   lisinopril (ZESTRIL) 5 MG tablet   atorvastatin (LIPITOR) 80 MG tablet   metFORMIN (GLUCOPHAGE-XR) 500 MG 24 hr tablet   Other Relevant Orders   Bayer DCA Hb A1c Waived   Lipid Panel w/o Chol/HDL Ratio   Type 2 diabetes mellitus with diabetic polyneuropathy (HCC) - Primary    Chronic, ongoing.  A1C above goal today at 8.7%.  Increase Metformin XR to 1000MG  BID, he denies GI issues.  Due to history of hypoglycemia, which he has had in past, will avoid restart of Glipizide at this time and if continued elevation next visit consider SGLT or GLP add on.  Recommend he focus on diet, as still tends to snack a lot at home, and avoid alcohol use.  Continue checking BS daily at home, reports he is doing this, but unsure they are as both brother and him seemed unsure of numbers at home.  Referral placed to podiatry and ophthalmology.  Return in 3 months.      Relevant Medications   gabapentin (NEURONTIN) 300 MG capsule   lisinopril (ZESTRIL) 5 MG tablet   atorvastatin (LIPITOR) 80 MG tablet   citalopram (CELEXA) 10 MG tablet   metFORMIN (GLUCOPHAGE-XR) 500 MG 24 hr tablet   Other Relevant Orders   Bayer DCA Hb A1c Waived   Basic metabolic panel   Microalbumin, Urine Waived   Ambulatory referral to Podiatry   Ambulatory referral to Ophthalmology     Other   Obesity    Recommend continued focus on healthy diet choices and regular physical activity (30 minutes 5 days  a week).  Recommend focus on small goals and setting timelines to achieve these.       Relevant Medications   metFORMIN (GLUCOPHAGE-XR) 500 MG 24 hr tablet   Vitamin B12 deficiency    Chronic, stable on recent check.  Recheck B12 level today and continue supplement.      Relevant Orders   Vitamin B12      Follow up plan: Return in  about 3 months (around 01/01/2020) for T2DM, HTN/HLD.

## 2019-10-01 NOTE — Assessment & Plan Note (Addendum)
Chronic, ongoing.  A1C above goal today at 8.7%.  Increase Metformin XR to 1000MG  BID, he denies GI issues.  Due to history of hypoglycemia, which he has had in past, will avoid restart of Glipizide at this time and if continued elevation next visit consider SGLT or GLP add on.  Recommend he focus on diet, as still tends to snack a lot at home, and avoid alcohol use.  Continue checking BS daily at home, reports he is doing this, but unsure they are as both brother and him seemed unsure of numbers at home.  Referral placed to podiatry and ophthalmology.  Return in 3 months.

## 2019-10-01 NOTE — Assessment & Plan Note (Signed)
Chronic, stable on recent check.  Recheck B12 level today and continue supplement.

## 2019-10-01 NOTE — Assessment & Plan Note (Signed)
Recommend continued focus on healthy diet choices and regular physical activity (30 minutes 5 days a week).  Recommend focus on small goals and setting timelines to achieve these.

## 2019-10-01 NOTE — Patient Instructions (Signed)
Carbohydrate Counting for Diabetes Mellitus, Adult  Carbohydrate counting is a method of keeping track of how many carbohydrates you eat. Eating carbohydrates naturally increases the amount of sugar (glucose) in the blood. Counting how many carbohydrates you eat helps keep your blood glucose within normal limits, which helps you manage your diabetes (diabetes mellitus). It is important to know how many carbohydrates you can safely have in each meal. This is different for every person. A diet and nutrition specialist (registered dietitian) can help you make a meal plan and calculate how many carbohydrates you should have at each meal and snack. Carbohydrates are found in the following foods:  Grains, such as breads and cereals.  Dried beans and soy products.  Starchy vegetables, such as potatoes, peas, and corn.  Fruit and fruit juices.  Milk and yogurt.  Sweets and snack foods, such as cake, cookies, candy, chips, and soft drinks. How do I count carbohydrates? There are two ways to count carbohydrates in food. You can use either of the methods or a combination of both. Reading "Nutrition Facts" on packaged food The "Nutrition Facts" list is included on the labels of almost all packaged foods and beverages in the [place] It includes:  The serving size.  Information about nutrients in each serving, including the grams (g) of carbohydrate per serving. To use the "Nutrition Facts":  Decide how many servings you will have.  Multiply the number of servings by the number of carbohydrates per serving.  The resulting number is the total amount of carbohydrates that you will be having. Learning standard serving sizes of other foods When you eat carbohydrate foods that are not packaged or do not include "Nutrition Facts" on the label, you need to measure the servings in order to count the amount of carbohydrates:  Measure the foods that you will eat with a food scale or measuring cup, if  needed.  Decide how many standard-size servings you will eat.  Multiply the number of servings by 15. Most carbohydrate-rich foods have about 15 g of carbohydrates per serving. ? For example, if you eat 8 oz (170 g) of strawberries, you will have eaten 2 servings and 30 g of carbohydrates (2 servings x 15 g = 30 g).  For foods that have more than one food mixed, such as soups and casseroles, you must count the carbohydrates in each food that is included. The following list contains standard serving sizes of common carbohydrate-rich foods. Each of these servings has about 15 g of carbohydrates:   hamburger bun or  English muffin.   oz (15 mL) syrup.   oz (14 g) jelly.  1 slice of bread.  1 six-inch tortilla.  3 oz (85 g) cooked rice or pasta.  4 oz (113 g) cooked dried beans.  4 oz (113 g) starchy vegetable, such as peas, corn, or potatoes.  4 oz (113 g) hot cereal.  4 oz (113 g) mashed potatoes or  of a large baked potato.  4 oz (113 g) canned or frozen fruit.  4 oz (120 mL) fruit juice.  4-6 crackers.  6 chicken nuggets.  6 oz (170 g) unsweetened dry cereal.  6 oz (170 g) plain fat-free yogurt or yogurt sweetened with artificial sweeteners.  8 oz (240 mL) milk.  8 oz (170 g) fresh fruit or one small piece of fruit.  24 oz (680 g) popped popcorn. Example of carbohydrate counting Sample meal  3 oz (85 g) chicken breast.  6 oz (170 g)   brown rice.  4 oz (113 g) corn.  8 oz (240 mL) milk.  8 oz (170 g) strawberries with sugar-free whipped topping. Carbohydrate calculation 1. Identify the foods that contain carbohydrates: ? Rice. ? Corn. ? Milk. ? Strawberries. 2. Calculate how many servings you have of each food: ? 2 servings rice. ? 1 serving corn. ? 1 serving milk. ? 1 serving strawberries. 3. Multiply each number of servings by 15 g: ? 2 servings rice x 15 g = 30 g. ? 1 serving corn x 15 g = 15 g. ? 1 serving milk x 15 g = 15 g. ? 1  serving strawberries x 15 g = 15 g. 4. Add together all of the amounts to find the total grams of carbohydrates eaten: ? 30 g + 15 g + 15 g + 15 g = 75 g of carbohydrates total. Summary  Carbohydrate counting is a method of keeping track of how many carbohydrates you eat.  Eating carbohydrates naturally increases the amount of sugar (glucose) in the blood.  Counting how many carbohydrates you eat helps keep your blood glucose within normal limits, which helps you manage your diabetes.  A diet and nutrition specialist (registered dietitian) can help you make a meal plan and calculate how many carbohydrates you should have at each meal and snack. This information is not intended to replace advice given to you by your health care provider. Make sure you discuss any questions you have with your health care provider. Document Revised: 01/16/2017 Document Reviewed: 12/06/2015 Elsevier Patient Education  2020 Elsevier Inc.  

## 2019-10-02 LAB — BASIC METABOLIC PANEL
BUN/Creatinine Ratio: 11 (ref 9–20)
BUN: 12 mg/dL (ref 6–24)
CO2: 22 mmol/L (ref 20–29)
Calcium: 9 mg/dL (ref 8.7–10.2)
Chloride: 101 mmol/L (ref 96–106)
Creatinine, Ser: 1.11 mg/dL (ref 0.76–1.27)
GFR calc Af Amer: 91 mL/min/{1.73_m2} (ref >59)
GFR calc non Af Amer: 79 mL/min/{1.73_m2} (ref >59)
Glucose: 199 mg/dL — ABNORMAL HIGH (ref 65–99)
Potassium: 4.4 mmol/L (ref 3.5–5.2)
Sodium: 140 mmol/L (ref 134–144)

## 2019-10-02 LAB — LIPID PANEL W/O CHOL/HDL RATIO
Cholesterol, Total: 146 mg/dL (ref 100–199)
HDL: 49 mg/dL (ref >39)
LDL Chol Calc (NIH): 79 mg/dL (ref 0–99)
Triglycerides: 97 mg/dL (ref 0–149)
VLDL Cholesterol Cal: 18 mg/dL (ref 5–40)

## 2019-10-02 LAB — VITAMIN B12: Vitamin B-12: 1157 pg/mL (ref 232–1245)

## 2019-10-02 NOTE — Progress Notes (Signed)
Please let [name] know by phone or letter that labs have returned: All labs have returned within good ranges, including kidney function, cholesterol, and B12.  Glucose was elevated, which is expected.  Ensure to take the increased dose of Metformin I sent in.  Have a great day!!

## 2019-10-08 ENCOUNTER — Telehealth: Payer: Self-pay

## 2019-10-19 ENCOUNTER — Other Ambulatory Visit: Payer: Self-pay

## 2019-10-19 ENCOUNTER — Ambulatory Visit (INDEPENDENT_AMBULATORY_CARE_PROVIDER_SITE_OTHER): Payer: Medicare Other | Admitting: Podiatry

## 2019-10-19 DIAGNOSIS — B351 Tinea unguium: Secondary | ICD-10-CM

## 2019-10-19 DIAGNOSIS — Z9181 History of falling: Secondary | ICD-10-CM

## 2019-10-19 DIAGNOSIS — M79676 Pain in unspecified toe(s): Secondary | ICD-10-CM

## 2019-10-19 DIAGNOSIS — Z8673 Personal history of transient ischemic attack (TIA), and cerebral infarction without residual deficits: Secondary | ICD-10-CM

## 2019-10-19 DIAGNOSIS — E0843 Diabetes mellitus due to underlying condition with diabetic autonomic (poly)neuropathy: Secondary | ICD-10-CM | POA: Diagnosis not present

## 2019-10-20 ENCOUNTER — Other Ambulatory Visit: Payer: Self-pay

## 2019-10-20 MED ORDER — METFORMIN HCL ER 500 MG PO TB24: 1000.0000 mg | ORAL_TABLET | Freq: Two times a day (BID) | ORAL | 3 refills | Status: DC

## 2019-10-20 NOTE — Telephone Encounter (Signed)
Fax from [facility]. Request for 90 day supply of Metformin.   Supply sent to [facility] 10/01/2019.   LOV: 10/01/2019; NOV: 01/04/2020

## 2019-10-24 NOTE — Progress Notes (Signed)
   HPI: 49 year old male h/o diabetes mellitus, and history of multiple strokes presents to the office today for evaluation of elongated, thickened nails that cause pain while ambulating in shoes. He is unable to trim his own nails. Patient is here for further evaluation and treatment.   Past Medical History:  Diagnosis Date  . Depression   . Diabetes mellitus without complication (HCC)   . Hydrocephalus in adult [facility])   . Hyperlipidemia   . Hypertension   . Seizures (HCC)   . Stroke [facility])      Physical Exam: General: The patient is alert and oriented x3 in no acute distress.  Dermatology: Skin is warm, dry and supple bilateral lower extremities. Negative for open lesions or macerations.  Hypertrophic elongated nails noted 1-5 bilateral  Vascular: Palpable pedal pulses bilaterally.  There is some moderate edema noted to the right foot and ankle. Capillary refill within normal limits.  Neurological: Epicritic and protective threshold diminished bilaterally.   Musculoskeletal Exam: Range of motion within normal limits to all pedal and ankle joints bilateral.  Loss of muscle strength mobility noted to the bilateral lower extremity secondary to the previous history of strokes  Assessment: 1. H/o strokes, multiple 2. Diabetes mellitus w/ peripheral polyneuropathy 3. Pain due to onychomycosis of toenail 4. H/o multiple recurrent falls   Plan of Care:  1. Patient evaluated.   2. Mechanical debridement of nails 1-5 was performed using a nail nipper without incident or bleeding 3. Appointment with Pedorthist for DM shoes and insoles.  4. Return to clinic as needed.       Felecia Shelling, DPM [facility]  Dr. Felecia Shelling, DPM    [address] [place], [place] [address]                Office [phone]  Fax [phone]

## 2019-10-27 ENCOUNTER — Ambulatory Visit: Payer: Medicare Other | Admitting: Orthotics

## 2019-10-27 ENCOUNTER — Other Ambulatory Visit: Payer: Self-pay

## 2019-10-27 ENCOUNTER — Telehealth: Payer: Self-pay | Admitting: Nurse Practitioner

## 2019-10-27 ENCOUNTER — Ambulatory Visit: Payer: Medicare Other

## 2019-10-27 DIAGNOSIS — M79676 Pain in unspecified toe(s): Secondary | ICD-10-CM

## 2019-10-27 DIAGNOSIS — Z8673 Personal history of transient ischemic attack (TIA), and cerebral infarction without residual deficits: Secondary | ICD-10-CM

## 2019-10-27 DIAGNOSIS — Z9181 History of falling: Secondary | ICD-10-CM

## 2019-10-27 DIAGNOSIS — B351 Tinea unguium: Secondary | ICD-10-CM

## 2019-10-27 NOTE — Telephone Encounter (Signed)
Routing to provider to advise.  

## 2019-10-27 NOTE — Telephone Encounter (Signed)
Yes, I highly recommend he obtain Covid vaccine due to current risk factors and chronic diseases.

## 2019-10-27 NOTE — Telephone Encounter (Signed)
[name], Peer Support for Pt wants CB just to verify that pt would be ok to take covid vacc. If so he will take him. Would like a FU at [phone]

## 2019-10-27 NOTE — Progress Notes (Signed)
Seen by NP

## 2019-10-27 NOTE — Telephone Encounter (Signed)
Called and notified [name] of [employer].

## 2019-11-01 ENCOUNTER — Other Ambulatory Visit: Payer: Self-pay

## 2019-11-01 ENCOUNTER — Encounter: Payer: Self-pay | Admitting: Family Medicine

## 2019-11-01 ENCOUNTER — Ambulatory Visit (INDEPENDENT_AMBULATORY_CARE_PROVIDER_SITE_OTHER): Payer: Medicare Other | Admitting: Family Medicine

## 2019-11-01 VITALS — BP 130/87 | HR 71 | Temp 98.4°F

## 2019-11-01 DIAGNOSIS — E1159 Type 2 diabetes mellitus with other circulatory complications: Secondary | ICD-10-CM | POA: Diagnosis not present

## 2019-11-01 DIAGNOSIS — E1142 Type 2 diabetes mellitus with diabetic polyneuropathy: Secondary | ICD-10-CM | POA: Diagnosis not present

## 2019-11-01 DIAGNOSIS — I10 Essential (primary) hypertension: Secondary | ICD-10-CM | POA: Diagnosis not present

## 2019-11-01 LAB — MICROALBUMIN, URINE WAIVED
Creatinine, Urine Waived: 200 mg/dL (ref 10–300)
Microalb, Ur Waived: 150 mg/L — ABNORMAL HIGH (ref 0–19)
Microalb/Creat Ratio: >300 mg/g — ABNORMAL HIGH (ref <30)

## 2019-11-01 NOTE — Assessment & Plan Note (Signed)
Needs diabetic shoes. Will fill form out for him. Continue to work on diabetes with his PCP. Monitor diet and exercise. Follow up with podiatry as needed. Call with any concerns.

## 2019-11-01 NOTE — Progress Notes (Signed)
BP 130/87   Pulse 71   Temp 98.4 F (36.9 C)   SpO2 99%    Subjective:    Patient ID: [name], male    DOB: 1971/06/11, 49 y.o.   MRN: [id number]  HPI: [name] is a 49 y.o. male  Chief Complaint  Patient presents with  . Diabetes    Patient would like diabetic shoes   Working closely with PCP and podiatry on his sugars. Due for recheck in June on his A1c. Needs diabetic shoes. He has no pain in his feet. Denies any burning or numbness or tingling. He is otherwise feeling well today with no other concerns or complaints.   Relevant past medical, surgical, family and social history reviewed and updated as indicated. Interim medical history since our last visit reviewed. Allergies and medications reviewed and updated.  Review of Systems  Constitutional: Negative.   Respiratory: Negative.   Cardiovascular: Negative.   Gastrointestinal: Negative.   Musculoskeletal: Negative.   Neurological: Negative.   Psychiatric/Behavioral: Negative.     Per HPI unless specifically indicated above     Objective:    BP 130/87   Pulse 71   Temp 98.4 F (36.9 C)   SpO2 99%   Wt Readings from Last 3 Encounters:  09/02/18 285 lb (129.3 kg)  08/27/18 230 lb (104.3 kg)  08/12/18 281 lb (127.5 kg)    Physical Exam Vitals and nursing note reviewed.  Constitutional:      General: He is not in acute distress.    Appearance: Normal appearance. He is obese. He is not ill-appearing, toxic-appearing or diaphoretic.  HENT:     Head: Normocephalic and atraumatic.     Right Ear: External ear normal.     Left Ear: External ear normal.     Nose: Nose normal.     Mouth/Throat:     Mouth: Mucous membranes are moist.     Pharynx: Oropharynx is clear.  Eyes:     General: No scleral icterus.       Right eye: No discharge.        Left eye: No discharge.     Extraocular Movements: Extraocular movements intact.     Conjunctiva/sclera: Conjunctivae normal.     Pupils: Pupils are equal,  round, and reactive to light.  Cardiovascular:     Rate and Rhythm: Normal rate and regular rhythm.     Pulses: Normal pulses.     Heart sounds: Normal heart sounds. No murmur. No friction rub. No gallop.   Pulmonary:     Effort: Pulmonary effort is normal. No respiratory distress.     Breath sounds: Normal breath sounds. No stridor. No wheezing, rhonchi or rales.  Chest:     Chest wall: No tenderness.  Musculoskeletal:        General: Normal range of motion.     Cervical back: Normal range of motion and neck supple.  Skin:    General: Skin is warm and dry.     Capillary Refill: Capillary refill takes less than 2 seconds.     Coloration: Skin is not jaundiced or pale.     Findings: No bruising, erythema, lesion or rash.  Neurological:     General: No focal deficit present.     Mental Status: He is alert and oriented to person, place, and time. Mental status is at baseline.  Psychiatric:        Mood and Affect: Mood normal.  Behavior: Behavior normal.        Thought Content: Thought content normal.        Judgment: Judgment normal.     Results for orders placed or performed in visit on 10/01/19  Bayer DCA Hb A1c Waived  Result Value Ref Range   HB A1C (BAYER DCA - WAIVED) 8.6 (H) <7.0 %  Basic metabolic panel  Result Value Ref Range   Glucose 199 (H) 65 - 99 mg/dL   BUN 12 6 - 24 mg/dL   Creatinine, Ser 1.79 0.76 - 1.27 mg/dL   GFR calc non Af Amer 79 >59 mL/min/1.73   GFR calc Af Amer 91 >59 mL/min/1.73   BUN/Creatinine Ratio 11 9 - 20   Sodium 140 134 - 144 mmol/L   Potassium 4.4 3.5 - 5.2 mmol/L   Chloride 101 96 - 106 mmol/L   CO2 22 20 - 29 mmol/L   Calcium 9.0 8.7 - 10.2 mg/dL  Lipid Panel w/o Chol/HDL Ratio  Result Value Ref Range   Cholesterol, Total 146 100 - 199 mg/dL   Triglycerides 97 0 - 149 mg/dL   HDL 49 >15 mg/dL   VLDL Cholesterol Cal 18 5 - 40 mg/dL   LDL Chol Calc (NIH) 79 0 - 99 mg/dL  Vitamin A56  Result Value Ref Range   Vitamin B-12  1,157 232 - 1,245 pg/mL      Assessment & Plan:   Problem List Items Addressed This Visit      Cardiovascular and Mediastinum   Hypertension associated with diabetes (HCC)   Relevant Orders   Microalbumin, Urine Waived     Endocrine   Type 2 diabetes mellitus with diabetic polyneuropathy (HCC) - Primary    Needs diabetic shoes. Will fill form out for him. Continue to work on diabetes with his PCP. Monitor diet and exercise. Follow up with podiatry as needed. Call with any concerns.       Relevant Medications   donepezil (ARICEPT) 5 MG tablet       Follow up plan: Return As scheduled.

## 2019-11-02 ENCOUNTER — Telehealth: Payer: Medicare Other | Admitting: General Practice

## 2019-11-02 ENCOUNTER — Ambulatory Visit (INDEPENDENT_AMBULATORY_CARE_PROVIDER_SITE_OTHER): Payer: Medicare Other | Admitting: General Practice

## 2019-11-02 DIAGNOSIS — E785 Hyperlipidemia, unspecified: Secondary | ICD-10-CM

## 2019-11-02 DIAGNOSIS — E1165 Type 2 diabetes mellitus with hyperglycemia: Secondary | ICD-10-CM

## 2019-11-02 DIAGNOSIS — I10 Essential (primary) hypertension: Secondary | ICD-10-CM

## 2019-11-02 DIAGNOSIS — E1169 Type 2 diabetes mellitus with other specified complication: Secondary | ICD-10-CM

## 2019-11-02 DIAGNOSIS — E1142 Type 2 diabetes mellitus with diabetic polyneuropathy: Secondary | ICD-10-CM | POA: Diagnosis not present

## 2019-11-02 DIAGNOSIS — E1159 Type 2 diabetes mellitus with other circulatory complications: Secondary | ICD-10-CM

## 2019-11-02 DIAGNOSIS — IMO0002 Reserved for concepts with insufficient information to code with codable children: Secondary | ICD-10-CM

## 2019-11-02 DIAGNOSIS — G918 Other hydrocephalus: Secondary | ICD-10-CM
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
## 2019-11-19 ENCOUNTER — Ambulatory Visit: Payer: Medicare Other | Attending: Internal Medicine

## 2019-11-19 DIAGNOSIS — Z23 Encounter for immunization: Secondary | ICD-10-CM

## 2019-11-19 LAB — HM DIABETES EYE EXAM

## 2019-11-19 NOTE — Progress Notes (Signed)
   Covid-19 Vaccination Clinic  Name:  [name]    MRN: [id number] DOB: [birth year]  11/19/2019  [name] was observed post Covid-19 immunization for 15 minutes without incident. He was provided with Vaccine Information Sheet and instruction to access the V-Safe system.   [name] was instructed to call 911 with any severe reactions post vaccine: Marland Kitchen Difficulty breathing  . Swelling of face and throat  . A fast heartbeat  . A bad rash all over body  . Dizziness and weakness   Immunizations Administered    Name Date Dose VIS Date Route   Pfizer COVID-19 Vaccine 11/19/2019 11:15 AM 0.3 mL 09/01/2018 Intramuscular   Manufacturer: [employer], [employer]   Lot: [id number]   NDC: [address]-0

## 2019-11-20 ENCOUNTER — Ambulatory Visit: Payer: Medicare Other

## 2019-11-22 ENCOUNTER — Telehealth: Payer: Self-pay | Admitting: Podiatry

## 2019-11-22 NOTE — Telephone Encounter (Signed)
Spoke to [employer] and he said pt was shown a pair of shoes and did see Dr Olevia Perches. I explained that Raiford Noble would look when he goes to [employer] office Wednesday to see if they did do measurements and foam impressions. I told [name] that he would hear from [place] probably Thursday.

## 2019-11-22 NOTE — Telephone Encounter (Signed)
Received message om 5.14.2021 from Mr compton checking on diabetic shoes.  I returned call and left message that pt is seen by NP and per medicare has to see the md/do to have diabetic shoe paperwork signed by them. He will then need an appt to pick out shoes with Raiford Noble in our [place] office.

## 2019-11-29 ENCOUNTER — Ambulatory Visit: Payer: Self-pay

## 2019-11-29 NOTE — Chronic Care Management (AMB) (Signed)
  Care Management   Follow Up Note   11/29/2019 Name: [name] MRN: [id number] DOB: 22-Jul-1970  Referred by: [name], NP Reason for referral : Care Coordination   [name] is a 49 y.o. year old male who is a primary care patient of Cannady, Dorie Rank, NP. The care management team was consulted for assistance with care management and care coordination needs.    Review of patient status, including review of consultants reports, relevant laboratory and other test results, and collaboration with appropriate care team members and the patient's provider was performed as part of comprehensive patient evaluation and provision of chronic care management services.    LCSW completed CCM outreach attempt today but was unable to reach patient's caretaker successfully. A HIPPA compliant voice message was left encouraging patient's caretaker to return call once available. LCSW rescheduled CCM SW appointment as well.  A HIPPA compliant phone message was left for the patient providing contact information and requesting a return call.   Dickie La, BSW, MSW, LCSW [facility] Care [facility] [place]  [facility] [email or web address] Phone: [phone]

## 2019-11-30 ENCOUNTER — Telehealth: Payer: Self-pay | Admitting: Podiatry

## 2019-11-30 NOTE — Telephone Encounter (Signed)
Left message for [name] that I have placed the order for the shoes and inserts but when rick seen pt he forgot to have in step into the foam box to get an impression for the inserts. I need the pt to come to the office just for a quick foam impression so we can send it off.

## 2019-12-07 ENCOUNTER — Other Ambulatory Visit: Payer: Self-pay | Admitting: Nurse Practitioner

## 2019-12-07 ENCOUNTER — Ambulatory Visit: Payer: Medicare Other | Attending: Neurology | Admitting: Physical Therapy

## 2019-12-07 NOTE — Telephone Encounter (Signed)
Requested Prescriptions  Pending Prescriptions Disp Refills  . clopidogrel (PLAVIX) 75 MG tablet [Pharmacy Med Name: CLOPIDOGREL BISULFATE 75 MG TAB] 30 tablet 0    Sig: TAKE 1 TABLET BY MOUTH ONCE DAILY WITH BREAKFAST     Hematology: Antiplatelets - clopidogrel Failed - 12/07/2019 10:25 AM      Failed - Evaluate AST, ALT within 2 months of therapy initiation.      Failed - HCT in normal range and within 180 days    Hematocrit  Date Value Ref Range Status  09/02/2018 38.0 37.5 - 51.0 % Final         Failed - HGB in normal range and within 180 days    Hemoglobin  Date Value Ref Range Status  09/02/2018 13.1 13.0 - 17.7 g/dL Final         Failed - PLT in normal range and within 180 days    Platelets  Date Value Ref Range Status  09/02/2018 275 150 - 450 x10E3/uL Final         Passed - ALT in normal range and within 360 days    ALT  Date Value Ref Range Status  03/04/2019 15 0 - 44 IU/L Final   SGPT (ALT)  Date Value Ref Range Status  07/07/2014 15 U/L Final    Comment:    14-63 NOTE: New Reference Range 01/25/14          Passed - AST in normal range and within 360 days    AST  Date Value Ref Range Status  03/04/2019 15 0 - 40 IU/L Final   SGOT(AST)  Date Value Ref Range Status  07/07/2014 10 (L) 15 - 37 Unit/L Final         Passed - Valid encounter within last 6 months    Recent Outpatient Visits          1 month ago Type 2 diabetes mellitus with diabetic polyneuropathy, without long-term current use of insulin (HCC)   [facility] [place], Megan P, DO   2 months ago Type 2 diabetes mellitus with diabetic polyneuropathy, without long-term current use of insulin (HCC)   [facility] Cannady, [name] T, NP   9 months ago Type 2 diabetes mellitus with diabetic polyneuropathy, without long-term current use of insulin (HCC)   [facility] [place], [place] T, NP   1 year ago Type 2 diabetes mellitus with diabetic polyneuropathy,  without long-term current use of insulin (HCC)   [facility] [place], [place] T, NP   1 year ago Uncontrolled type 2 diabetes mellitus with peripheral neuropathy (HCC)   [facility] Cannady, Dorie Rank, NP      Future Appointments            In 4 weeks Cannady, Dorie Rank, NP [employer], [facility]

## 2019-12-10 ENCOUNTER — Ambulatory Visit (INDEPENDENT_AMBULATORY_CARE_PROVIDER_SITE_OTHER): Payer: Medicare Other | Admitting: Nurse Practitioner

## 2019-12-10 ENCOUNTER — Other Ambulatory Visit: Payer: Self-pay

## 2019-12-10 ENCOUNTER — Encounter: Payer: Self-pay | Admitting: Nurse Practitioner

## 2019-12-10 DIAGNOSIS — R159 Full incontinence of feces: Secondary | ICD-10-CM | POA: Insufficient documentation

## 2019-12-10 DIAGNOSIS — N3941 Urge incontinence: Secondary | ICD-10-CM | POA: Diagnosis not present

## 2019-12-10 NOTE — Patient Instructions (Signed)

## 2019-12-10 NOTE — Progress Notes (Signed)
There were no vitals taken for this visit.   Subjective:    Patient ID: [name], male    DOB: December 26, 1970, 49 y.o.   MRN: [id number]  HPI: [name] is a 49 y.o. male  Chief Complaint  Patient presents with  . Urinary Incontinence    Caregiver/Brother [name] states he has been having some accidents. Ongoing appx 1 year, but episodes are becoming more frequent. Wondering if his medications are causing episodes.  . Encopresis    . This visit was completed via telephone due to the restrictions of the COVID-19 pandemic. All issues as above were discussed and addressed but no physical exam was performed. If it was felt that the patient should be evaluated in the office, they were directed there. The patient verbally consented to this visit. Patient was unable to complete an audio/visual visit due to Lack of equipment. Due to the catastrophic nature of the COVID-19 pandemic, this visit was done through audio contact only. . Location of the patient: home . Location of the provider: work . Those involved with this call:  . Provider: Aura Dials, DNP . CMA: Myrtha Mantis, CMA . Front Desk/Registration: Adela Ports  . Time spent on call: 20 minutes on the phone discussing health concerns. 15 minutes total spent in review of patient's record and preparation of their chart.  . I verified patient identity using two factors (patient name and date of birth). Patient consents verbally to being seen via telemedicine visit today.     His brother, [name], is on phone today and provided HPI, did not hear from patient during this visit -- he was in background answering questions via brother.  URINARY INCONTINENCE AND FECAL INCONTINENCE: Is having urinary incontinence episodes, has been ongoing for one year but has become more frequent over the past month or so.  Is T2DM and last A1C had been elevated at 8.6%, Metformin was increased and they report he has been taking as ordered.  He is  also having incontinence of stool, this has been going on for the past 3 months. At baseline he is W/C bound due to past CVA, but is able to transfer to toilet with assist per his brother's report.  He has 2-3 episodes of urinary incontinence a day and then 2-3 times a week is incontinent of stool.  Reports having bowel movement every other day with no straining and no blood in stool, no diarrhea -- stool is formed when passes.  Is drinking alcohol at home, one beer a day during week per report and then a few on weekends.  Has not had colonoscopy, no family history of colon cancer.  Denies polyuria or polyphagia.   Dysuria: no Urinary frequency: yes Urgency: yes Small volume voids: no Symptom severity: no Urinary incontinence: yes Foul odor: no Hematuria: no Abdominal pain: no Back pain: no Suprapubic pain/pressure: no Flank pain: no Fever:  no Vomiting: no Treatments attempted: none  Relevant past medical, surgical, family and social history reviewed and updated as indicated. Interim medical history since our last visit reviewed. Allergies and medications reviewed and updated.  Review of Systems  Constitutional: Negative for activity change, diaphoresis, fatigue and fever.  Respiratory: Negative for cough, chest tightness, shortness of breath and wheezing.   Cardiovascular: Negative for chest pain, palpitations and leg swelling.  Gastrointestinal: Negative for abdominal distention, abdominal pain, blood in stool, constipation, diarrhea, nausea and vomiting.  Endocrine: Negative for cold intolerance, heat intolerance, polydipsia, polyphagia and polyuria.  Genitourinary: Positive for frequency and urgency. Negative for decreased urine volume, dysuria, flank pain and hematuria.  Musculoskeletal: Negative.   Skin: Negative.   Neurological: Negative for dizziness, syncope, weakness, light-headedness, numbness and headaches.  Psychiatric/Behavioral: Negative.     Per HPI unless  specifically indicated above     Objective:    There were no vitals taken for this visit.  Wt Readings from Last 3 Encounters:  09/02/18 285 lb (129.3 kg)  08/27/18 230 lb (104.3 kg)  08/12/18 281 lb (127.5 kg)    Physical Exam   Unable to perform due to telephone visit only.  Results for orders placed or performed in visit on 11/22/19  HM DIABETES EYE EXAM  Result Value Ref Range   HM Diabetic Eye Exam No Retinopathy No Retinopathy      Assessment & Plan:   Problem List Items Addressed This Visit      Other   Incontinence of feces    Over past 3 months with 2-3 episodes a week, no blood in stool or pain.  ? Related to mobility issues.  At this time will place GI referral as may benefit from further work-up and colonoscopy due to age (65).  Obtain labs at visit in 2 weeks.        Relevant Orders   Ambulatory referral to Gastroenterology   Urge incontinence of urine - Primary    Ongoing episodes with increased frequency of episodes over past month per brother report.  ? Related to mobility issues or recent elevation in A1C.  Highly recommend they follow-up in office in two weeks for diabetes check + other labs & physical exam.  At this time continue Metformin as ordered twice a day and recommend cutting back on alcohol intake.  Practice Kegel exercises at home.  Continue to collaborate with CCM team.  Patient would benefit from health aid back in home, which once had, declines at this time.  Return to office in 2 weeks.  If A1C improved and WNL exam at next visit, could consider addition of medication to assist with urgency + check PSA.         I discussed the assessment and treatment plan with the patient. The patient was provided an opportunity to ask questions and all were answered. The patient agreed with the plan and demonstrated an understanding of the instructions.   The patient was advised to call back or seek an in-person evaluation if the symptoms worsen or if the  condition fails to improve as anticipated.   I provided 21+ minutes of time during this encounter.  Follow up plan: Return in about 2 weeks (around 12/24/2019) for T2DM, HTN/HLD, urinary and stool incontinence.

## 2019-12-10 NOTE — Assessment & Plan Note (Signed)
Over past 3 months with 2-3 episodes a week, no blood in stool or pain.  ? Related to mobility issues.  At this time will place GI referral as may benefit from further work-up and colonoscopy due to age (71).  Obtain labs at visit in 2 weeks.

## 2019-12-10 NOTE — Assessment & Plan Note (Addendum)
Ongoing episodes with increased frequency of episodes over past month per brother report.  ? Related to mobility issues or recent elevation in A1C.  Highly recommend they follow-up in office in two weeks for diabetes check + other labs & physical exam.  At this time continue Metformin as ordered twice a day and recommend cutting back on alcohol intake.  Practice Kegel exercises at home.  Continue to collaborate with CCM team.  Patient would benefit from health aid back in home, which once had, declines at this time.  Return to office in 2 weeks.  If A1C improved and WNL exam at next visit, could consider addition of medication to assist with urgency + check PSA.

## 2019-12-13 ENCOUNTER — Encounter: Payer: Medicare Other | Admitting: Physical Therapy

## 2019-12-14 ENCOUNTER — Ambulatory Visit: Payer: Medicare Other

## 2019-12-20 ENCOUNTER — Encounter: Payer: Medicare Other | Admitting: Physical Therapy

## 2019-12-22 ENCOUNTER — Ambulatory Visit: Payer: Medicare Other | Admitting: Orthotics

## 2019-12-22 ENCOUNTER — Other Ambulatory Visit: Payer: Self-pay

## 2019-12-22 DIAGNOSIS — Z9181 History of falling: Secondary | ICD-10-CM

## 2019-12-22 DIAGNOSIS — E0843 Diabetes mellitus due to underlying condition with diabetic autonomic (poly)neuropathy: Secondary | ICD-10-CM

## 2019-12-22 NOTE — Progress Notes (Signed)
Cast for foam impressions to be sent to [facility]

## 2019-12-24 ENCOUNTER — Ambulatory Visit: Payer: Medicare Other | Attending: Internal Medicine

## 2019-12-24 DIAGNOSIS — Z23 Encounter for immunization: Secondary | ICD-10-CM

## 2019-12-24 NOTE — Progress Notes (Signed)
   Covid-19 Vaccination Clinic  Name:  JOHNATHA ZEIDMAN    MRN: [id number] DOB: Oct 29, 1970  12/24/2019  Mr. Culp was observed post Covid-19 immunization for 15 minutes without incident. He was provided with Vaccine Information Sheet and instruction to access the V-Safe system.   Mr. Diloreto was instructed to call 911 with any severe reactions post vaccine: Marland Kitchen Difficulty breathing  . Swelling of face and throat  . A fast heartbeat  . A bad rash all over body  . Dizziness and weakness   Immunizations Administered    Name Date Dose VIS Date Route   Pfizer COVID-19 Vaccine 12/24/2019 11:29 AM 0.3 mL 09/01/2018 Intramuscular   Manufacturer: [employer], [employer]   Lot: [id number]   NDC: [address]-7

## 2019-12-27 ENCOUNTER — Encounter: Payer: Medicare Other | Admitting: Physical Therapy

## 2019-12-31 ENCOUNTER — Encounter: Payer: Self-pay | Admitting: Nurse Practitioner

## 2019-12-31 DIAGNOSIS — E1129 Type 2 diabetes mellitus with other diabetic kidney complication: Secondary | ICD-10-CM | POA: Insufficient documentation

## 2020-01-04 ENCOUNTER — Other Ambulatory Visit: Payer: Self-pay | Admitting: Nurse Practitioner

## 2020-01-04 ENCOUNTER — Ambulatory Visit: Payer: Medicare Other | Admitting: Nurse Practitioner

## 2020-01-04 NOTE — Telephone Encounter (Signed)
Requested Prescriptions  Pending Prescriptions Disp Refills   amLODipine (NORVASC) 10 MG tablet [Pharmacy Med Name: AMLODIPINE BESYLATE 10 MG TAB] 90 tablet     Sig: TAKE 1 TABLET BY MOUTH ONCE DAILY     Cardiovascular:  Calcium Channel Blockers Passed - 01/04/2020  3:46 PM      Passed - Last BP in normal range    BP Readings from Last 1 Encounters:  11/01/19 130/87         Passed - Valid encounter within last 6 months    Recent Outpatient Visits          3 weeks ago Urge incontinence of urine   [facility] [place], [name] T, NP   2 months ago Type 2 diabetes mellitus with diabetic polyneuropathy, without long-term current use of insulin (HCC)   [facility] [place], Megan P, DO   3 months ago Type 2 diabetes mellitus with diabetic polyneuropathy, without long-term current use of insulin (HCC)   [facility] [place], [name] T, NP   10 months ago Type 2 diabetes mellitus with diabetic polyneuropathy, without long-term current use of insulin (HCC)   [facility] [place], [place] T, NP   1 year ago Type 2 diabetes mellitus with diabetic polyneuropathy, without long-term current use of insulin (HCC)   [facility] [place], [place] T, NP              clopidogrel (PLAVIX) 75 MG tablet [Pharmacy Med Name: CLOPIDOGREL BISULFATE 75 MG TAB] 90 tablet 0    Sig: TAKE 1 TABLET BY MOUTH ONCE DAILY WITH BREAKFAST     Hematology: Antiplatelets - clopidogrel Failed - 01/04/2020  3:46 PM      Failed - Evaluate AST, ALT within 2 months of therapy initiation.      Failed - HCT in normal range and within 180 days    Hematocrit  Date Value Ref Range Status  09/02/2018 38.0 37.5 - 51.0 % Final         Failed - HGB in normal range and within 180 days    Hemoglobin  Date Value Ref Range Status  09/02/2018 13.1 13.0 - 17.7 g/dL Final         Failed - PLT in normal range and within 180 days    Platelets  Date Value Ref Range Status   09/02/2018 275 150 - 450 x10E3/uL Final         Passed - ALT in normal range and within 360 days    ALT  Date Value Ref Range Status  03/04/2019 15 0 - 44 IU/L Final   SGPT (ALT)  Date Value Ref Range Status  07/07/2014 15 U/L Final    Comment:    14-63 NOTE: New Reference Range 01/25/14          Passed - AST in normal range and within 360 days    AST  Date Value Ref Range Status  03/04/2019 15 0 - 40 IU/L Final   SGOT(AST)  Date Value Ref Range Status  07/07/2014 10 (L) 15 - 37 Unit/L Final         Passed - Valid encounter within last 6 months    Recent Outpatient Visits          3 weeks ago Urge incontinence of urine   [facility] [place], [name] T, NP   2 months ago Type 2 diabetes mellitus with diabetic polyneuropathy, without long-term current use of insulin (HCC)   [facility] [place], Megan P, DO   3 months ago Type 2 diabetes mellitus with diabetic polyneuropathy, without long-term current use of insulin (HCC)   [facility] [place], [name] T, NP   10 months ago Type 2 diabetes mellitus with diabetic polyneuropathy, without long-term current use of insulin (HCC)   [facility] [place], [place] T, NP   1 year ago Type 2 diabetes mellitus with diabetic polyneuropathy, without long-term current use of insulin (HCC)   [facility] [place], Dorie Rank, NP

## 2020-01-05 ENCOUNTER — Encounter: Payer: Medicare Other | Admitting: Physical Therapy

## 2020-01-11 ENCOUNTER — Ambulatory Visit (INDEPENDENT_AMBULATORY_CARE_PROVIDER_SITE_OTHER): Payer: Medicare Other | Admitting: Nurse Practitioner

## 2020-01-11 ENCOUNTER — Encounter: Payer: Medicare Other | Admitting: Physical Therapy

## 2020-01-11 ENCOUNTER — Other Ambulatory Visit: Payer: Self-pay

## 2020-01-11 ENCOUNTER — Encounter: Payer: Self-pay | Admitting: Nurse Practitioner

## 2020-01-11 VITALS — BP 132/80 | HR 66 | Temp 98.4°F

## 2020-01-11 DIAGNOSIS — E1159 Type 2 diabetes mellitus with other circulatory complications: Secondary | ICD-10-CM

## 2020-01-11 DIAGNOSIS — E1129 Type 2 diabetes mellitus with other diabetic kidney complication: Secondary | ICD-10-CM | POA: Diagnosis not present

## 2020-01-11 DIAGNOSIS — E538 Deficiency of other specified B group vitamins: Secondary | ICD-10-CM

## 2020-01-11 DIAGNOSIS — E1142 Type 2 diabetes mellitus with diabetic polyneuropathy: Secondary | ICD-10-CM | POA: Diagnosis not present

## 2020-01-11 DIAGNOSIS — I10 Essential (primary) hypertension: Secondary | ICD-10-CM

## 2020-01-11 DIAGNOSIS — Z6838 Body mass index (BMI) 38.0-38.9, adult: Secondary | ICD-10-CM

## 2020-01-11 DIAGNOSIS — I152 Hypertension secondary to endocrine disorders: Secondary | ICD-10-CM

## 2020-01-11 DIAGNOSIS — R809 Proteinuria, unspecified: Secondary | ICD-10-CM

## 2020-01-11 DIAGNOSIS — E785 Hyperlipidemia, unspecified: Secondary | ICD-10-CM

## 2020-01-11 DIAGNOSIS — E1169 Type 2 diabetes mellitus with other specified complication: Secondary | ICD-10-CM

## 2020-01-11 LAB — BAYER DCA HB A1C WAIVED: HB A1C (BAYER DCA - WAIVED): 7.8 % — ABNORMAL HIGH (ref <7.0)

## 2020-01-11 MED ORDER — BLOOD GLUCOSE MONITOR KIT: PACK | 0 refills | Status: DC

## 2020-01-11 NOTE — Assessment & Plan Note (Signed)
Recommended eating smaller high protein, low fat meals more frequently and exercising 30 mins a day 5 times a week with a goal of 10-15lb weight loss in the next 3 months. Patient voiced their understanding and motivation to adhere to these recommendations.  

## 2020-01-11 NOTE — Assessment & Plan Note (Signed)
Chronic, ongoing.  A1C above goal today at 7.8%, but trending down from previous. Continue Metformin XR 1000MG  BID, he denies GI issues.  He does not wish to start new medication at this time and wishes to focus on diet changes.  Due to history of hypoglycemia, which he has had in past, will avoid restart of Glipizide at this time and if continued elevation next visit consider SGLT or GLP add on.  Recommend he focus on diet, as still tends to snack a lot at home, and avoid alcohol use.  Continue checking BS daily at home, new glucometer script provided and educated brother and him on BS goals.  Continue Lisinopril for kidney protection.  Return in 3 months.

## 2020-01-11 NOTE — Assessment & Plan Note (Signed)
Chronic, stable on recent check.  Continue daily supplement which is benefiting neuropathy. 

## 2020-01-11 NOTE — Assessment & Plan Note (Signed)
Chronic, ongoing.  BP at goal today.  Continue current medication regimen and adjust as needed at next visit.  Recommend checking BP daily at home and documenting for provider.  He has a BP cuff available at his home. BMP today.  Return in 3 months for f/u.

## 2020-01-11 NOTE — Assessment & Plan Note (Signed)
Chronic, ongoing.  Continue current medication regimen and adjust as needed.  Return in 3 months and obtain lipid panel next visit.

## 2020-01-11 NOTE — Progress Notes (Signed)
BP 132/80   Pulse 66   Temp 98.4 F (36.9 C) (Oral)   SpO2 97%    Subjective:    Patient ID: [name], male    DOB: [birth year], 49 y.o.   MRN: [id number]  HPI: [name] is a 49 y.o. male  Chief Complaint  Patient presents with  . Diabetes  . Hyperlipidemia  . Hypertension   His brother is at bedside with him today -- currently patient lives with him and then he reports a home health person comes out every day to assist.    DIABETES Continues on Metformin XR 1000 MG daily with last A1C 8.6% in March with urine ALB 150, Metformin was increased to 1000 MG BID.  Endorses continued poor diet choices at times.  There was concern from friend reported to CCM team that patient was drinking, patient lives with his brother.  He denies heavy alcohol intake and reports only occasional, 2 times one drink a week -- beer and liquor.  History of B12 deficiency with last level improved with supplement -- last level in March was 1157. Hypoglycemic episodes:no Polydipsia/polyuria: no Visual disturbance: no Chest pain: no Paresthesias: no Glucose Monitoring: yes  Accucheck frequency: not checking  Fasting glucose:   Post prandial:  Evening:  Before meals: Taking Insulin?: no  Long acting insulin:  Short acting insulin: Blood Pressure Monitoring: daily Retinal Examination: Not up to Date Foot Exam: Up to Date Pneumovax: Up to Date Influenza: up to date Aspirin: yes   HYPERTENSION / HYPERLIPIDEMIA Continues on Amlodipine 10 MG, ASA, Plavix, Carvedilol 6.25 MG BID, Lisinopril 5 MG QDAY, and Lipitor 80 MG daily. Satisfied with current treatment? yes Duration of hypertension: chronic BP monitoring frequency: not checking BP range: not checking BP medication side effects: no Duration of hyperlipidemia: chronic Cholesterol medication side effects: no Cholesterol supplements: none Medication compliance: good compliance Aspirin: yes Recent stressors: no Recurrent headaches:  no Visual changes: no Palpitations: no Dyspnea: no Chest pain: no Lower extremity edema: no Dizzy/lightheaded: no   Relevant past medical, surgical, family and social history reviewed and updated as indicated. Interim medical history since our last visit reviewed. Allergies and medications reviewed and updated.  Review of Systems  Constitutional: Negative for activity change, diaphoresis, fatigue and fever.  Respiratory: Negative for cough, chest tightness, shortness of breath and wheezing.   Cardiovascular: Negative for chest pain, palpitations and leg swelling.  Gastrointestinal: Negative for abdominal distention, abdominal pain, constipation, diarrhea, nausea and vomiting.  Endocrine: Negative for cold intolerance, heat intolerance, polydipsia, polyphagia and polyuria.  Neurological: Negative for dizziness, syncope, weakness, light-headedness, numbness and headaches.  Psychiatric/Behavioral: Negative.     Per HPI unless specifically indicated above     Objective:    BP 132/80   Pulse 66   Temp 98.4 F (36.9 C) (Oral)   SpO2 97%   Wt Readings from Last 3 Encounters:  09/02/18 285 lb (129.3 kg)  08/27/18 230 lb (104.3 kg)  08/12/18 281 lb (127.5 kg)    Physical Exam Vitals and nursing note reviewed.  Constitutional:      General: He is awake. He is not in acute distress.    Appearance: He is well-developed. He is obese. He is not ill-appearing.  HENT:     Head: Normocephalic and atraumatic.     Right Ear: Hearing normal. No drainage.     Left Ear: Hearing normal. No drainage.  Eyes:     General: Lids are normal.  Right eye: No discharge.        Left eye: No discharge.     Conjunctiva/sclera: Conjunctivae normal.     Pupils: Pupils are equal, round, and reactive to light.  Neck:     Thyroid: No thyromegaly.     Vascular: No carotid bruit.  Cardiovascular:     Rate and Rhythm: Normal rate and regular rhythm.     Heart sounds: Normal heart sounds, S1 normal  and S2 normal. No murmur heard.  No gallop.   Pulmonary:     Effort: Pulmonary effort is normal. No accessory muscle usage or respiratory distress.     Breath sounds: Normal breath sounds.  Abdominal:     General: Bowel sounds are normal.     Palpations: Abdomen is soft.  Musculoskeletal:        General: Normal range of motion.     Cervical back: Normal range of motion and neck supple.     Right lower leg: No edema.     Left lower leg: No edema.  Skin:    General: Skin is warm and dry.     Capillary Refill: Capillary refill takes less than 2 seconds.  Neurological:     Mental Status: He is alert and oriented to person, place, and time.  Psychiatric:        Attention and Perception: Attention normal.        Mood and Affect: Mood normal.        Speech: Speech normal.        Behavior: Behavior normal. Behavior is cooperative.     Results for orders placed or performed in visit on 11/22/19  HM DIABETES EYE EXAM  Result Value Ref Range   HM Diabetic Eye Exam No Retinopathy No Retinopathy      Assessment & Plan:   Problem List Items Addressed This Visit      Cardiovascular and Mediastinum   Hypertension associated with diabetes (HCC)    Chronic, ongoing.  BP at goal today.  Continue current medication regimen and adjust as needed at next visit.  Recommend checking BP daily at home and documenting for provider.  He has a BP cuff available at his home. BMP today.  Return in 3 months for f/u.      Relevant Orders   Basic metabolic panel     Endocrine   Hyperlipidemia associated with type 2 diabetes mellitus (HCC)    Chronic, ongoing.  Continue current medication regimen and adjust as needed.  Return in 3 months and obtain lipid panel next visit.      Type 2 diabetes mellitus with diabetic polyneuropathy (HCC) - Primary    Chronic, ongoing.  A1C 7.8% today.  Continue Metformin XR 1000MG  BID, he denies GI issues.  He does not wish to add medication at this time.  Due to history  of hypoglycemia, which he has had in past, will avoid restart of Glipizide at this time and if continued elevation next visit consider SGLT or GLP add on.  Recommend he focus on diet, as still tends to snack a lot at home, and avoid alcohol use.  Continue checking BS daily at home.  Return in 3 months.      Relevant Medications   donepezil (ARICEPT) 5 MG tablet   Other Relevant Orders   Bayer DCA Hb A1c Waived   Type 2 diabetes mellitus with proteinuria (HCC)    Chronic, ongoing.  A1C above goal today at 7.8%, but trending down from previous.  Continue Metformin XR 1000MG  BID, he denies GI issues.  He does not wish to start new medication at this time and wishes to focus on diet changes.  Due to history of hypoglycemia, which he has had in past, will avoid restart of Glipizide at this time and if continued elevation next visit consider SGLT or GLP add on.  Recommend he focus on diet, as still tends to snack a lot at home, and avoid alcohol use.  Continue checking BS daily at home, new glucometer script provided and educated brother and him on BS goals.  Continue Lisinopril for kidney protection.  Return in 3 months.        Other   Obesity    Recommended eating smaller high protein, low fat meals more frequently and exercising 30 mins a day 5 times a week with a goal of 10-15lb weight loss in the next 3 months. Patient voiced their understanding and motivation to adhere to these recommendations.       Vitamin B12 deficiency    Chronic, stable on recent check.  Continue daily supplement which is benefiting neuropathy.         Follow up plan: Return in about 3 months (around 04/12/2020) for T2DM, HTN/HLD.

## 2020-01-11 NOTE — Patient Instructions (Signed)

## 2020-01-11 NOTE — Assessment & Plan Note (Signed)
Chronic, ongoing.  A1C 7.8% today.  Continue Metformin XR 1000MG  BID, he denies GI issues.  He does not wish to add medication at this time.  Due to history of hypoglycemia, which he has had in past, will avoid restart of Glipizide at this time and if continued elevation next visit consider SGLT or GLP add on.  Recommend he focus on diet, as still tends to snack a lot at home, and avoid alcohol use.  Continue checking BS daily at home.  Return in 3 months.

## 2020-01-12 ENCOUNTER — Ambulatory Visit (INDEPENDENT_AMBULATORY_CARE_PROVIDER_SITE_OTHER): Payer: Medicare Other | Admitting: General Practice

## 2020-01-12 ENCOUNTER — Telehealth: Payer: Medicare Other | Admitting: General Practice

## 2020-01-12 DIAGNOSIS — E1169 Type 2 diabetes mellitus with other specified complication: Secondary | ICD-10-CM

## 2020-01-12 DIAGNOSIS — E1159 Type 2 diabetes mellitus with other circulatory complications: Secondary | ICD-10-CM

## 2020-01-12 DIAGNOSIS — E1165 Type 2 diabetes mellitus with hyperglycemia: Secondary | ICD-10-CM | POA: Diagnosis not present

## 2020-01-12 DIAGNOSIS — E785 Hyperlipidemia, unspecified: Secondary | ICD-10-CM | POA: Diagnosis not present

## 2020-01-12 DIAGNOSIS — I10 Essential (primary) hypertension: Secondary | ICD-10-CM

## 2020-01-12 DIAGNOSIS — I152 Hypertension secondary to endocrine disorders: Secondary | ICD-10-CM

## 2020-01-12 DIAGNOSIS — G918 Other hydrocephalus: Secondary | ICD-10-CM

## 2020-01-12 DIAGNOSIS — E1142 Type 2 diabetes mellitus with diabetic polyneuropathy: Secondary | ICD-10-CM

## 2020-01-12 DIAGNOSIS — N3941 Urge incontinence: Secondary | ICD-10-CM

## 2020-01-12 DIAGNOSIS — IMO0002 Reserved for concepts with insufficient information to code with codable children: Secondary | ICD-10-CM

## 2020-01-12 LAB — BASIC METABOLIC PANEL
BUN/Creatinine Ratio: 9 (ref 9–20)
BUN: 10 mg/dL (ref 6–24)
CO2: 23 mmol/L (ref 20–29)
Calcium: 9.4 mg/dL (ref 8.7–10.2)
Chloride: 100 mmol/L (ref 96–106)
Creatinine, Ser: 1.08 mg/dL (ref 0.76–1.27)
GFR calc Af Amer: 94 mL/min/{1.73_m2} (ref >59)
GFR calc non Af Amer: 81 mL/min/{1.73_m2} (ref >59)
Glucose: 128 mg/dL — ABNORMAL HIGH (ref 65–99)
Potassium: 4.3 mmol/L (ref 3.5–5.2)
Sodium: 140 mmol/L (ref 134–144)
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
## 2020-01-12 NOTE — Progress Notes (Signed)
Please let Derrius know kidney function and electrolytes continue to be stable on labs.  Have a great day!!

## 2020-01-17 ENCOUNTER — Encounter: Payer: Medicare Other | Admitting: Physical Therapy

## 2020-01-19 ENCOUNTER — Ambulatory Visit: Payer: Medicare Other | Admitting: Orthotics

## 2020-01-26 ENCOUNTER — Ambulatory Visit (INDEPENDENT_AMBULATORY_CARE_PROVIDER_SITE_OTHER): Payer: Medicare Other | Admitting: Orthotics

## 2020-01-26 ENCOUNTER — Other Ambulatory Visit: Payer: Self-pay

## 2020-01-26 ENCOUNTER — Telehealth: Payer: Self-pay | Admitting: Nurse Practitioner

## 2020-01-26 DIAGNOSIS — Z9181 History of falling: Secondary | ICD-10-CM

## 2020-01-26 DIAGNOSIS — Z8673 Personal history of transient ischemic attack (TIA), and cerebral infarction without residual deficits: Secondary | ICD-10-CM

## 2020-01-26 DIAGNOSIS — E0843 Diabetes mellitus due to underlying condition with diabetic autonomic (poly)neuropathy: Secondary | ICD-10-CM | POA: Diagnosis not present

## 2020-01-26 NOTE — Telephone Encounter (Signed)
Phone call to patient; spoke with pts. brother, [name].  Mayra Reel about blood sugar meter.  Brother stated he has the meter, but needs the test strips.  Brother unsure of brand of meter.  Stated the Case Worker, [name] will be contacted to call back and give the information on the blood sugar meter.  [name] also stated that the Case Worker is trying to obtain a BP monitor for patient.  Declined needing the office to order a BP monitor at this time.  Advised pt's brother that office will await call from Case Worker with information on Glucose meter.  Verb. Understanding.

## 2020-01-26 NOTE — Progress Notes (Signed)

## 2020-01-26 NOTE — Telephone Encounter (Signed)
Patient requesting blood pressure monitor and glucose monitor kit please send to  , informed please allow 48 to 72 hour turn around time.   [place], [place]. Phone:  [phone]  Fax:  [phone]

## 2020-02-09 ENCOUNTER — Telehealth: Payer: Self-pay

## 2020-02-09 ENCOUNTER — Other Ambulatory Visit: Payer: Self-pay

## 2020-02-09 ENCOUNTER — Encounter: Payer: Self-pay | Admitting: Gastroenterology

## 2020-02-09 ENCOUNTER — Ambulatory Visit (INDEPENDENT_AMBULATORY_CARE_PROVIDER_SITE_OTHER): Payer: Medicare Other | Admitting: Gastroenterology

## 2020-02-09 VITALS — BP 134/83 | HR 70 | Temp 98.0°F | Ht 73.0 in | Wt 300.0 lb

## 2020-02-09 DIAGNOSIS — R159 Full incontinence of feces: Secondary | ICD-10-CM | POA: Diagnosis not present

## 2020-02-09 MED ORDER — NA SULFATE-K SULFATE-MG SULF 17.5-3.13-1.6 GM/177ML PO SOLN: ORAL | 0 refills | Status: DC

## 2020-02-09 NOTE — Addendum Note (Signed)
Addended by: Adela Ports on: 02/09/2020 04:08 PM   Modules accepted: Orders

## 2020-02-09 NOTE — Progress Notes (Signed)
[name] [address]  [place], [place] [address]  Main: [phone]  Fax: [phone]   Gastroenterology Consultation  Referring Provider:     Venita Lick, NP Primary Care Physician:  Venita Lick, NP Reason for Consultation:     Fecal incontinence        HPI:    Chief Complaint  Patient presents with  . Incontinence of feces    [name] is a 49 y.o. y/o male referred for consultation & management  by Dr. Venita Lick, NP.  Patient presents for evaluation with his brother that he lives with.  History of stroke in 2015.  Brother helps provide history as patient has memory difficulty since the stroke.  They report fecal incontinence since the stroke.  Patient has one bowel movement every other day and sometimes it is soft, sometimes it is watery but they have noticed incontinence of feces.  No blood in stool.  No prior colonoscopy.  No family history of colon cancer.  No abdominal pain, nausea or vomiting or dysphagia.  Past Medical History:  Diagnosis Date  . Depression   . Diabetes mellitus without complication ([place])   . Hydrocephalus in adult [facility])   . Hyperlipidemia   . Hypertension   . Seizures ([place])   . Stroke [facility])     Past Surgical History:  Procedure Laterality Date  . LOOP RECORDER IMPLANT  12/20/13   MDT LinQ implanted by Dr Lovena Le for cryptogenic stroke  . LOOP RECORDER IMPLANT N/A 12/20/2013   Procedure: LOOP RECORDER IMPLANT;  Surgeon: Evans Lance, MD;  Location: [facility] CATH LAB;  Service: Cardiovascular;  Laterality: N/A;    Prior to Admission medications   Medication Sig Start Date End Date Taking? Authorizing Provider  amLODipine (NORVASC) 10 MG tablet TAKE 1 TABLET BY MOUTH ONCE DAILY 12/07/19  Yes Cannady, [name] T, NP  ASPIR-LOW 81 MG EC tablet TAKE 1 TABLET BY MOUTH ONCE DAILY 05/11/19  Yes Cannady, [name] T, NP  atorvastatin (LIPITOR) 80 MG tablet TAKE 1 TABLET BY MOUTH ONCE DAILY AT 6PM 10/01/19  Yes  Cannady, [name] T, NP  blood glucose meter kit and supplies KIT Dispense based on patient and insurance preference. Use up to four times daily as directed. (FOR ICD-9 250.00, 250.01). 01/11/20  Yes Cannady, [name] T, NP  carvedilol (COREG) 6.25 MG tablet TAKE 1 TABLET BY MOUTH TWICE DAILY WITH A MEAL 10/01/19  Yes Cannady, [name] T, NP  citalopram (CELEXA) 10 MG tablet Take 1 tablet (10 mg total) by mouth daily. 10/01/19  Yes Cannady, [name] T, NP  clopidogrel (PLAVIX) 75 MG tablet TAKE 1 TABLET BY MOUTH ONCE DAILY WITH BREAKFAST 01/04/20  Yes Cannady, [name] T, NP  donepezil (ARICEPT) 5 MG tablet Take 1 tablet by mouth daily. 12/14/19  Yes [provider]  gabapentin (NEURONTIN) 300 MG capsule Take 1 capsule (300 mg total) by mouth 3 (three) times daily. 10/01/19  Yes Cannady, Henrine Screws T, NP  glucose blood (ACCU-CHEK AVIVA) test strip Use as instructed 10/27/15  Yes Roselee Nova, MD  Lancet Devices [facility]) lancets Use as instructed 10/27/15  Yes Keith Rake Asad A, MD  lisinopril (ZESTRIL) 5 MG tablet Take 1 tablet (5 mg total) by mouth daily. 10/01/19  Yes Cannady, [name] T, NP  metFORMIN (GLUCOPHAGE-XR) 500 MG 24 hr tablet Take 2 tablets (1,000 mg total) by mouth in the morning and at bedtime. 10/20/19  Yes Cannady, [name] T,  NP  vitamin B-12 (CYANOCOBALAMIN) 1000 MCG tablet Take 1 tablet (1,000 mcg total) by mouth daily. 09/03/18  Yes Marnee Guarneri T, NP    Family History  Problem Relation Age of Onset  . Stroke Father   . Heart disease Father   . Hypertension Father   . Hypertension Mother   . Diabetes Mother   . Arthritis Brother   . Diabetes Maternal Grandfather      Social History   Tobacco Use  . Smoking status: Former Research scientist (life sciences)  . Smokeless tobacco: Never Used  . Tobacco comment: > 4 years quit  Vaping Use  . Vaping Use: Never used  Substance Use Topics  . Alcohol use: Yes    Alcohol/week: 6.0 standard drinks    Types: 6 Cans of beer per week    Comment: in a  week  . Drug use: No    Allergies as of 02/09/2020  . (No Known Allergies)    Review of Systems:    All systems reviewed and negative except where noted in HPI.   Physical Exam:  BP 134/83   Pulse 70   Temp 98 F (36.7 C) (Oral)   Ht _0  (1.854 m)   Wt 300 lb (136.1 kg)   BMI 39.58 kg/m  No LMP for male patient. Psych:  Alert and cooperative. Normal mood and affect. General:   Alert,  Well-developed, well-nourished, pleasant and cooperative in NAD Head:  Normocephalic and atraumatic. Eyes:  Sclera clear, no icterus.   Conjunctiva pink. Ears:  Normal auditory acuity. Nose:  No deformity, discharge, or lesions. Mouth:  No deformity or lesions,oropharynx pink & moist. Neck:  Supple; no masses or thyromegaly. Abdomen:  Normal bowel sounds.  No bruits.  Soft, non-tender and non-distended without masses, hepatosplenomegaly or hernias noted.  No guarding or rebound tenderness.    Msk:  Symmetrical without gross deformities. Good, equal movement & strength bilaterally. Pulses:  Normal pulses noted. Extremities:  No clubbing or edema.  No cyanosis. Neurologic:  Alert and oriented x3;  grossly normal neurologically. Skin:  Intact without significant lesions or rashes. No jaundice. Lymph Nodes:  No significant cervical adenopathy. Psych:  Alert and cooperative. Normal mood and affect.   Labs: CBC    Component Value Date/Time   WBC 4.0 09/02/2018 1056   WBC 4.6 08/27/2018 1519   RBC 4.28 09/02/2018 1056   RBC 4.76 08/27/2018 1519   HGB 13.1 09/02/2018 1056   HCT 38.0 09/02/2018 1056   PLT 275 09/02/2018 1056   MCV 89 09/02/2018 1056   MCV 90 07/07/2014 0625   MCH 30.6 09/02/2018 1056   MCH 30.3 08/27/2018 1519   MCHC 34.5 09/02/2018 1056   MCHC 32.7 08/27/2018 1519   RDW 13.2 09/02/2018 1056   RDW 13.5 07/07/2014 0625   LYMPHSABS 1.0 09/02/2018 1056   LYMPHSABS 1.7 07/07/2014 0625   MONOABS 0.4 07/07/2014 0625   EOSABS 0.3 09/02/2018 1056   EOSABS 0.3 07/07/2014  0625   BASOSABS 0.0 09/02/2018 1056   BASOSABS 0.1 07/07/2014 0625   CMP     Component Value Date/Time   NA 140 01/11/2020 1112   NA 136 07/07/2014 0625   K 4.3 01/11/2020 1112   K 3.7 07/07/2014 0625   CL 100 01/11/2020 1112   CL 102 07/07/2014 0625   CO2 23 01/11/2020 1112   CO2 28 07/07/2014 0625   GLUCOSE 128 (H) 01/11/2020 1112   GLUCOSE 165 (H) 08/28/2018 0530   GLUCOSE 181 (H) 07/07/2014  0625   BUN 10 01/11/2020 1112   BUN 11 07/07/2014 0625   CREATININE 1.08 01/11/2020 1112   CREATININE 1.19 07/07/2014 0625   CALCIUM 9.4 01/11/2020 1112   CALCIUM 8.3 (L) 07/07/2014 0625   PROT 7.0 03/04/2019 1111   PROT 7.3 07/07/2014 0625   ALBUMIN 4.8 03/04/2019 1111   ALBUMIN 3.8 07/07/2014 0625   AST 15 03/04/2019 1111   AST 10 (L) 07/07/2014 0625   ALT 15 03/04/2019 1111   ALT 15 07/07/2014 0625   ALKPHOS 76 03/04/2019 1111   ALKPHOS 77 07/07/2014 0625   BILITOT 0.4 03/04/2019 1111   BILITOT 0.3 07/07/2014 0625   GFRNONAA 81 01/11/2020 1112   GFRNONAA >60 07/07/2014 0625   GFRNONAA >60 12/15/2013 0408   GFRAA 94 01/11/2020 1112   GFRAA >60 07/07/2014 0625   GFRAA >60 12/15/2013 0408    Imaging Studies: No results found.  Assessment and Plan:   DAMIN SALIDO is a 49 y.o. y/o male has been referred for fecal incontinence that started since a stroke in 2015  Symptoms likely due to pelvic floor dysfunction Refer for pelvic floor physical therapy  Patient is due for screening colonoscopy which would also allow [place] to rule out any underlying lesions  I have discussed alternative options, risks & benefits,  which include, but are not limited to, bleeding, infection, perforation,respiratory complication & drug reaction.  The patient agrees with this plan & written consent will be obtained.       Dr [name]  Speech recognition software was used to dictate the above note.

## 2020-02-09 NOTE — Addendum Note (Signed)
Addended by: Adela Ports on: 02/09/2020 04:24 PM   Modules accepted: Kipp Brood

## 2020-02-09 NOTE — Patient Instructions (Addendum)
We will be sending your referral to the rehab center and they will be contacting you with an appointment date and time.

## 2020-02-09 NOTE — Telephone Encounter (Signed)
I faxed a plavix request. Awaiting on response and then notify the patient.

## 2020-02-11 NOTE — Telephone Encounter (Signed)
Called patient but was not able to leave him a voicemail. Therefore, I will mail him a letter with his instructions of when to stop and restart his plavix. Per Mrs. [name] T. Cannady, NP: Patient is to stop his plavix 5 days before his procedure and restart 2 days after his procedure.

## 2020-02-25 ENCOUNTER — Ambulatory Visit (INDEPENDENT_AMBULATORY_CARE_PROVIDER_SITE_OTHER): Payer: Medicare Other | Admitting: Licensed Clinical Social Worker

## 2020-02-25 DIAGNOSIS — E785 Hyperlipidemia, unspecified: Secondary | ICD-10-CM | POA: Diagnosis not present

## 2020-02-25 DIAGNOSIS — E1169 Type 2 diabetes mellitus with other specified complication: Secondary | ICD-10-CM

## 2020-02-25 DIAGNOSIS — I10 Essential (primary) hypertension: Secondary | ICD-10-CM

## 2020-02-25 DIAGNOSIS — E1165 Type 2 diabetes mellitus with hyperglycemia: Secondary | ICD-10-CM

## 2020-02-25 DIAGNOSIS — E1142 Type 2 diabetes mellitus with diabetic polyneuropathy: Secondary | ICD-10-CM

## 2020-02-25 DIAGNOSIS — E1159 Type 2 diabetes mellitus with other circulatory complications: Secondary | ICD-10-CM

## 2020-02-25 DIAGNOSIS — IMO0002 Reserved for concepts with insufficient information to code with codable children: Secondary | ICD-10-CM

## 2020-02-25 NOTE — Chronic Care Management (AMB) (Addendum)
Chronic Care Management    Clinical Social Work Follow Up Note  02/25/2020 Name: SUMIT BRANHAM MRN: [id number] DOB: 1971/03/06  CALEM COCOZZA is a 49 y.o. year old male who is a primary care patient of Cannady, Barbaraann Faster, NP. The CCM team was consulted for assistance with [employer] .   Review of patient status, including review of consultants reports, other relevant assessments, and collaboration with appropriate care team members and the patient's provider was performed as part of comprehensive patient evaluation and provision of chronic care management services.    SDOH (Social Determinants of Health) assessments performed: Yes    Outpatient Encounter Medications as of 02/25/2020  Medication Sig  . amLODipine (NORVASC) 10 MG tablet TAKE 1 TABLET BY MOUTH ONCE DAILY  . ASPIR-LOW 81 MG EC tablet TAKE 1 TABLET BY MOUTH ONCE DAILY  . atorvastatin (LIPITOR) 80 MG tablet TAKE 1 TABLET BY MOUTH ONCE DAILY AT 6PM  . blood glucose meter kit and supplies KIT Dispense based on patient and insurance preference. Use up to four times daily as directed. (FOR ICD-9 250.00, 250.01).  . carvedilol (COREG) 6.25 MG tablet TAKE 1 TABLET BY MOUTH TWICE DAILY WITH A MEAL  . citalopram (CELEXA) 10 MG tablet Take 1 tablet (10 mg total) by mouth daily.  . clopidogrel (PLAVIX) 75 MG tablet TAKE 1 TABLET BY MOUTH ONCE DAILY WITH BREAKFAST  . donepezil (ARICEPT) 5 MG tablet Take 1 tablet by mouth daily.  Marland Kitchen gabapentin (NEURONTIN) 300 MG capsule Take 1 capsule (300 mg total) by mouth 3 (three) times daily.  Marland Kitchen glucose blood (ACCU-CHEK AVIVA) test strip Use as instructed  . Lancet Devices (ACCU-CHEK SOFTCLIX) lancets Use as instructed  . lisinopril (ZESTRIL) 5 MG tablet Take 1 tablet (5 mg total) by mouth daily.  . metFORMIN (GLUCOPHAGE-XR) 500 MG 24 hr tablet Take 2 tablets (1,000 mg total) by mouth in the morning and at bedtime.  . Na Sulfate-K Sulfate-Mg Sulf 17.5-3.13-1.6 GM/177ML SOLN At 5 PM the day  before procedure take 1 bottle and 5 hours before procedure take 1 bottle.  . vitamin B-12 (CYANOCOBALAMIN) 1000 MCG tablet Take 1 tablet (1,000 mcg total) by mouth daily.   No facility-administered encounter medications on file as of 02/25/2020.     Goals Addressed    . "He needs as much support as he can get"       Current Barriers:  . Financial constraints . Limited social support . Level of care concerns . ADL IADL limitations . Mental Health Concerns  . Social Isolation . Limited education about criss support resources within the area* . Limited access to caregiver . Cognitive Deficits . Memory Deficits . Lacks knowledge of community resource: financial assistance and support resources that are eligible for patient to use.  Clinical Social Work Clinical Goal(s):  Marland Kitchen Over the next 90 days, client will work with SW to address concerns related to lack of self-care and ongoing financial barriers . Over the next 120 days, patient/caregiver will work with SW to address concerns related to lack of support/resource connection. LCSW will assist patient in gaining additional support/resource connection and community resource education in order to maintain health and mental health appropriately  .  Over the next 120 days, patient will demonstrate improved adherence to self care as evidenced by implementing healthy self-care into his daily routine such as: attending all medical appointments, deep breathing exercises, taking time for self-reflection, taking medications as prescribed, drinking water and daily exercise to improve  mobility.  .  Over the next 120 days, patient will demonstrate improved health management independence as evidenced by implementing healthy self-care skills and positive support/resources into his daily routine to help cope with stressors and improve overall health and well-being  .              Over the next 120 days, patient or caregiver will verbalize basic understanding of  depression/stress process and self health management plan as evidenced by his participation in development of long term plan of care and institution of self health management strategies  Interventions: . Patient interviewed and appropriate assessments performed . Provided patient's aide with information about crisis support and financial assistance support resources within the area . Discussed plans with patient for ongoing care management follow up and provided patient with direct contact information for care management team . UPDATE- [name] states that he contacted PCP office a month ago for DME assistance and has not received a call back. Patient recently moved to a different (more expensive apartment) and left his blood pressure and glucose monitor and maintenance threw this equipment away by the time they went back to retrieve it. Patient has not been able to keep track of his blood sugar/pressure due to this. LCSW will in basket message CCM RNCM. [name] reports that patient's wheelchair has died as well and they do not have the funds/resources to buy a new battery and the wheelchair takes two. [name] is wondering if a wheelchair battery prescription can be me completed in order for Medicare or Medicaid to help cover cost. LCSW informed patient that this may not be possible but will check with CCM team and PCP.  Marland Kitchen Confirmed stable transportation to patient's colonoscopy procedure on 03/01/20 : patient has ongoing issues  . Collaborated with patient's caregiver [name]  re: patient's need for additional resource support, DME and financial assistance.  . Confirmed that patient now has a payee representative  . Assisted patient/caregiver with obtaining information about health plan benefits . Provided education to patient/caregiver regarding level of care options. . Confirmed with patient's caregiver that patient new pair of eyeglasses are working well for him  Patient Self Care Activities:  . Attends all  scheduled provider appointments . Calls provider office for new concerns or questions  Please see past updates related to this goal by clicking on the "Past Updates" button in the selected goal       Follow Up Plan: SW will follow up with patient by phone over the next 60 days  Eula Fried, [place], MSW, [email or web address] Phone: [phone]

## 2020-02-28 ENCOUNTER — Other Ambulatory Visit
Admission: RE | Admit: 2020-02-28 | Discharge: 2020-02-28 | Disposition: A | Discharge status: Home / Self Care | Payer: Medicare Other | Source: Ambulatory Visit | Attending: Gastroenterology | Admitting: Gastroenterology

## 2020-02-28 ENCOUNTER — Other Ambulatory Visit: Payer: Self-pay

## 2020-02-28 DIAGNOSIS — Z20822 Contact with and (suspected) exposure to covid-19: Secondary | ICD-10-CM | POA: Diagnosis not present

## 2020-02-28 DIAGNOSIS — Z01812 Encounter for preprocedural laboratory examination: Secondary | ICD-10-CM | POA: Insufficient documentation

## 2020-02-28 LAB — SARS CORONAVIRUS 2 (TAT 6-24 HRS): SARS Coronavirus 2: NEGATIVE

## 2020-02-29 ENCOUNTER — Encounter: Payer: Self-pay | Admitting: Gastroenterology

## 2020-03-01 ENCOUNTER — Ambulatory Visit: Payer: Medicare Other | Admitting: Certified Registered Nurse Anesthetist

## 2020-03-01 ENCOUNTER — Other Ambulatory Visit: Payer: Self-pay

## 2020-03-01 ENCOUNTER — Encounter
Admission: RE | Disposition: A | Discharge status: Home / Self Care | Payer: Self-pay | Source: Home / Self Care | Attending: Gastroenterology

## 2020-03-01 ENCOUNTER — Ambulatory Visit
Admission: RE | Admit: 2020-03-01 | Discharge: 2020-03-01 | Disposition: A | Discharge status: Home / Self Care | Payer: Medicare Other | Attending: Gastroenterology | Admitting: Gastroenterology

## 2020-03-01 DIAGNOSIS — Z539 Procedure and treatment not carried out, unspecified reason: Secondary | ICD-10-CM | POA: Diagnosis not present

## 2020-03-01 SURGERY — COLONOSCOPY WITH PROPOFOL
Anesthesia: General

## 2020-03-02 ENCOUNTER — Telehealth: Payer: Self-pay

## 2020-03-02 ENCOUNTER — Other Ambulatory Visit: Payer: Self-pay

## 2020-03-02 ENCOUNTER — Telehealth: Payer: Self-pay | Admitting: Gastroenterology

## 2020-03-02 DIAGNOSIS — R159 Full incontinence of feces: Secondary | ICD-10-CM

## 2020-03-02 MED ORDER — NA SULFATE-K SULFATE-MG SULF 17.5-3.13-1.6 GM/177ML PO SOLN
ORAL | 0 refills | Status: DC
Start: 2020-03-02 — End: 2020-12-14

## 2020-03-02 NOTE — Telephone Encounter (Signed)
They would need to reach out to GI to reschedule this procedure.

## 2020-03-02 NOTE — Telephone Encounter (Signed)
Pls Call [name] pt brother back to resch colonoscopy. His brother has had a stroke and [name] followed the instructions of the stopping plavix 5 days before but when he was taken back for procedure pt stated that he was on the plavix , did not fully understand as his brother gives the meds etc. Now they need to be recontacted for resch of the procedure Call [name] [phone]

## 2020-03-02 NOTE — Telephone Encounter (Signed)
Called and advised to contact gi per [name].

## 2020-03-02 NOTE — Telephone Encounter (Signed)
RX for meter and supplies faxed to [facility] and RX for BP cuff faxed to [facility]. Called and notified patient's brother that this was done for him.

## 2020-03-02 NOTE — Telephone Encounter (Signed)
Called patient's brother-Jerry (POA) and asked him when he would like to reschedule his brother's colonoscopy. He stated that he wanted it to be done next week. I told him that we needed at least 5 days since he takes Plavix. [name] understood and stated that we could schedule it on 03/08/2020. I told him that it was a great day. I went over all of the instructions with him and told him that I would send the prescription to the drug store. I also reminded him to stop his brother's Plavix starting tomorrow and he understood and had no further questions.

## 2020-03-02 NOTE — Telephone Encounter (Signed)
Patients brother calling stating pt answered the BT question wrong yesterday with the nurse, and they would not do his procedure. Please call pt brother [name] back to reschedule procedure with Dr. Karie Schwalbe.

## 2020-03-03 ENCOUNTER — Other Ambulatory Visit: Payer: Self-pay | Admitting: Nurse Practitioner

## 2020-03-03 MED ORDER — ONETOUCH VERIO VI STRP: ORAL_STRIP | 12 refills | Status: DC

## 2020-03-03 MED ORDER — BLOOD PRESSURE MONITOR AUTOMAT DEVI: 1 refills | Status: AC

## 2020-03-03 MED ORDER — ONETOUCH VERIO FLEX SYSTEM W/DEVICE KIT: PACK | 1 refills | Status: DC

## 2020-03-06 ENCOUNTER — Other Ambulatory Visit: Payer: Self-pay

## 2020-03-06 ENCOUNTER — Other Ambulatory Visit
Admission: RE | Admit: 2020-03-06 | Discharge: 2020-03-06 | Disposition: A | Discharge status: Home / Self Care | Payer: Medicare Other | Source: Ambulatory Visit | Attending: Gastroenterology | Admitting: Gastroenterology

## 2020-03-06 DIAGNOSIS — Z20822 Contact with and (suspected) exposure to covid-19: Secondary | ICD-10-CM | POA: Insufficient documentation

## 2020-03-06 DIAGNOSIS — Z01812 Encounter for preprocedural laboratory examination: Secondary | ICD-10-CM | POA: Diagnosis present

## 2020-03-06 LAB — SARS CORONAVIRUS 2 (TAT 6-24 HRS): SARS Coronavirus 2: NEGATIVE

## 2020-03-07 ENCOUNTER — Encounter: Payer: Self-pay | Admitting: Gastroenterology

## 2020-03-08 ENCOUNTER — Ambulatory Visit
Admission: RE | Admit: 2020-03-08 | Discharge: 2020-03-08 | Disposition: A | Discharge status: Home / Self Care | Payer: Medicare Other | Attending: Gastroenterology | Admitting: Gastroenterology

## 2020-03-08 ENCOUNTER — Ambulatory Visit: Payer: Medicare Other | Admitting: Certified Registered"

## 2020-03-08 ENCOUNTER — Encounter
Admission: RE | Disposition: A | Discharge status: Home / Self Care | Payer: Self-pay | Source: Home / Self Care | Attending: Gastroenterology

## 2020-03-08 ENCOUNTER — Other Ambulatory Visit: Payer: Self-pay

## 2020-03-08 ENCOUNTER — Encounter: Payer: Self-pay | Admitting: Gastroenterology

## 2020-03-08 DIAGNOSIS — Z79899 Other long term (current) drug therapy: Secondary | ICD-10-CM | POA: Diagnosis not present

## 2020-03-08 DIAGNOSIS — F329 Major depressive disorder, single episode, unspecified: Secondary | ICD-10-CM | POA: Diagnosis not present

## 2020-03-08 DIAGNOSIS — Z8673 Personal history of transient ischemic attack (TIA), and cerebral infarction without residual deficits: Secondary | ICD-10-CM | POA: Diagnosis not present

## 2020-03-08 DIAGNOSIS — I10 Essential (primary) hypertension: Secondary | ICD-10-CM | POA: Insufficient documentation

## 2020-03-08 DIAGNOSIS — E119 Type 2 diabetes mellitus without complications: Secondary | ICD-10-CM | POA: Insufficient documentation

## 2020-03-08 DIAGNOSIS — Z7984 Long term (current) use of oral hypoglycemic drugs: Secondary | ICD-10-CM | POA: Diagnosis not present

## 2020-03-08 DIAGNOSIS — E785 Hyperlipidemia, unspecified: Secondary | ICD-10-CM | POA: Diagnosis not present

## 2020-03-08 DIAGNOSIS — R569 Unspecified convulsions: Secondary | ICD-10-CM | POA: Insufficient documentation

## 2020-03-08 DIAGNOSIS — Z87891 Personal history of nicotine dependence: Secondary | ICD-10-CM | POA: Insufficient documentation

## 2020-03-08 DIAGNOSIS — K529 Noninfective gastroenteritis and colitis, unspecified: Secondary | ICD-10-CM | POA: Diagnosis not present

## 2020-03-08 DIAGNOSIS — Z1211 Encounter for screening for malignant neoplasm of colon: Secondary | ICD-10-CM

## 2020-03-08 DIAGNOSIS — Z7982 Long term (current) use of aspirin: Secondary | ICD-10-CM | POA: Insufficient documentation

## 2020-03-08 DIAGNOSIS — Z7902 Long term (current) use of antithrombotics/antiplatelets: Secondary | ICD-10-CM | POA: Diagnosis not present

## 2020-03-08 DIAGNOSIS — R159 Full incontinence of feces: Secondary | ICD-10-CM

## 2020-03-08 HISTORY — PX: COLONOSCOPY WITH PROPOFOL: SHX5780

## 2020-03-08 LAB — GLUCOSE, CAPILLARY: Glucose-Capillary: 149 mg/dL — ABNORMAL HIGH (ref 70–99)

## 2020-03-08 SURGERY — COLONOSCOPY WITH PROPOFOL
Anesthesia: General

## 2020-03-08 MED ORDER — PROPOFOL 500 MG/50ML IV EMUL
INTRAVENOUS | Status: AC
  Filled 2020-03-08: qty 50

## 2020-03-08 MED ORDER — EPHEDRINE SULFATE 50 MG/ML IJ SOLN
INTRAMUSCULAR | Status: DC | PRN
  Administered 2020-03-08 (×2): 5 mg via INTRAVENOUS

## 2020-03-08 MED ORDER — LIDOCAINE HCL (PF) 2 % IJ SOLN
INTRAMUSCULAR | Status: AC
  Filled 2020-03-08: qty 10

## 2020-03-08 MED ORDER — ESMOLOL HCL 100 MG/10ML IV SOLN
INTRAVENOUS | Status: AC
  Filled 2020-03-08: qty 10

## 2020-03-08 MED ORDER — EPHEDRINE 5 MG/ML INJ
INTRAVENOUS | Status: AC
  Filled 2020-03-08: qty 10

## 2020-03-08 MED ORDER — PROPOFOL 10 MG/ML IV BOLUS
INTRAVENOUS | Status: DC | PRN
  Administered 2020-03-08: 30 mg via INTRAVENOUS

## 2020-03-08 MED ORDER — LIDOCAINE HCL (CARDIAC) PF 100 MG/5ML IV SOSY
PREFILLED_SYRINGE | INTRAVENOUS | Status: DC | PRN
  Administered 2020-03-08: 100 mg via INTRAVENOUS

## 2020-03-08 MED ORDER — MIDAZOLAM HCL 2 MG/2ML IJ SOLN
INTRAMUSCULAR | Status: AC
  Filled 2020-03-08: qty 2

## 2020-03-08 MED ORDER — GLYCOPYRROLATE 0.2 MG/ML IJ SOLN
INTRAMUSCULAR | Status: AC
  Filled 2020-03-08: qty 4

## 2020-03-08 MED ORDER — SODIUM CHLORIDE 0.9 % IV SOLN: INTRAVENOUS | Status: DC

## 2020-03-08 MED ORDER — PROPOFOL 500 MG/50ML IV EMUL
INTRAVENOUS | Status: DC | PRN
  Administered 2020-03-08: 155 ug/kg/min via INTRAVENOUS

## 2020-03-08 MED ORDER — GLYCOPYRROLATE 0.2 MG/ML IJ SOLN
INTRAMUSCULAR | Status: DC | PRN
  Administered 2020-03-08 (×2): .2 mg via INTRAVENOUS

## 2020-03-08 MED ORDER — LIDOCAINE HCL (PF) 2 % IJ SOLN
INTRAMUSCULAR | Status: AC
  Filled 2020-03-08: qty 5

## 2020-03-08 NOTE — H&P (Signed)
Vonda Antigua, MD [address] [place], [place], [place], [address] [address], [place], [place], [place], [address] Phone: [phone]  Fax: [phone]  Primary Care Physician:  Venita Lick, NP   Pre-Procedure History & Physical: HPI:  [name] is a 49 y.o. male is here for a colonoscopy.   Past Medical History:  Diagnosis Date  . Depression   . Diabetes mellitus without complication ([place])   . Hydrocephalus in adult [facility])   . Hyperlipidemia   . Hypertension   . Seizures ([place])   . Stroke [facility])     Past Surgical History:  Procedure Laterality Date  . LOOP RECORDER IMPLANT  12/20/13   MDT LinQ implanted by Dr Lovena Le for cryptogenic stroke  . LOOP RECORDER IMPLANT N/A 12/20/2013   Procedure: LOOP RECORDER IMPLANT;  Surgeon: Evans Lance, MD;  Location: [facility] CATH LAB;  Service: Cardiovascular;  Laterality: N/A;    Prior to Admission medications   Medication Sig Start Date End Date Taking? Authorizing Provider  clopidogrel (PLAVIX) 75 MG tablet TAKE 1 TABLET BY MOUTH ONCE DAILY WITH BREAKFAST 01/04/20  Yes Cannady, [name] T, NP  amLODipine (NORVASC) 10 MG tablet TAKE 1 TABLET BY MOUTH ONCE DAILY 03/03/20   Cannady, [name] T, NP  ASPIR-LOW 81 MG EC tablet TAKE 1 TABLET BY MOUTH ONCE DAILY 05/11/19   Cannady, [name] T, NP  atorvastatin (LIPITOR) 80 MG tablet TAKE 1 TABLET BY MOUTH ONCE DAILY AT 6PM 10/01/19   Cannady, Henrine Screws T, NP  blood glucose meter kit and supplies KIT Dispense based on patient and insurance preference. Use up to four times daily as directed. (FOR ICD-9 250.00, 250.01). 01/11/20   Cannady, Henrine Screws T, NP  Blood Glucose Monitoring Suppl (ONETOUCH VERIO FLEX SYSTEM) w/Device KIT Use to check blood sugar at least 2-3 times a day, in morning fasting and then 2 hours after a meal. 03/03/20   Cannady, [name] T, NP  Blood Pressure Monitoring (BLOOD PRESSURE MONITOR AUTOMAT) DEVI Use to monitor blood pressure once daily. 03/03/20   Cannady, Henrine Screws T, NP   carvedilol (COREG) 6.25 MG tablet TAKE 1 TABLET BY MOUTH TWICE DAILY WITH A MEAL 10/01/19   Cannady, [name] T, NP  citalopram (CELEXA) 10 MG tablet Take 1 tablet (10 mg total) by mouth daily. 10/01/19   Cannady, Henrine Screws T, NP  donepezil (ARICEPT) 5 MG tablet Take 1 tablet by mouth daily. 12/14/19   [provider]  gabapentin (NEURONTIN) 300 MG capsule Take 1 capsule (300 mg total) by mouth 3 (three) times daily. 10/01/19   Marnee Guarneri T, NP  glucose blood (ONETOUCH VERIO) test strip Use to check blood sugar at least 2-3 times a day, in morning fasting and 2 hours after a meal. 03/03/20   Venita Lick, NP  Lancet Devices (ACCU-CHEK [place]) lancets Use as instructed 10/27/15   Roselee Nova, MD  lisinopril (ZESTRIL) 5 MG tablet Take 1 tablet (5 mg total) by mouth daily. 10/01/19   Cannady, Henrine Screws T, NP  metFORMIN (GLUCOPHAGE-XR) 500 MG 24 hr tablet Take 2 tablets (1,000 mg total) by mouth in the morning and at bedtime. 10/20/19   Cannady, [name] T, NP  Na Sulfate-K Sulfate-Mg Sulf 17.5-3.13-1.6 GM/177ML SOLN At 5 PM the day before procedure take 1 bottle and 5 hours before procedure take 1 bottle. 03/02/20   Virgel Manifold, MD  vitamin B-12 (CYANOCOBALAMIN) 1000 MCG tablet Take 1 tablet (1,000 mcg total) by mouth daily. 09/03/18   Cannady,  Barbaraann Faster, NP    Allergies as of 03/02/2020  . (No Known Allergies)    Family History  Problem Relation Age of Onset  . Stroke Father   . Heart disease Father   . Hypertension Father   . Hypertension Mother   . Diabetes Mother   . Arthritis Brother   . Diabetes Maternal Grandfather     Social History   Socioeconomic History  . Marital status: Single    Spouse name: Not on file  . Number of children: Not on file  . Years of education: Not on file  . Highest education level: Not on file  Occupational History  . Not on file  Tobacco Use  . Smoking status: Former Research scientist (life sciences)  . Smokeless tobacco: Never Used  . Tobacco comment: > 4  years quit  Vaping Use  . Vaping Use: Never used  Substance and Sexual Activity  . Alcohol use: Yes    Alcohol/week: 6.0 standard drinks    Types: 6 Cans of beer per week    Comment: in a week  . Drug use: No  . Sexual activity: Not Currently  Other Topics Concern  . Not on file  Social History Narrative  . Not on file   Social Determinants of Health   Financial Resource Strain: High Risk  . Difficulty of Paying Living Expenses: Very hard  Food Insecurity: No Food Insecurity  . Worried About Charity fundraiser in the Last Year: Never true  . Ran Out of Food in the Last Year: Never true  Transportation Needs: No Transportation Needs  . Lack of Transportation (Medical): No  . Lack of Transportation (Non-Medical): No  Physical Activity: Inactive  . Days of Exercise per Week: 0 days  . Minutes of Exercise per Session: 0 min  Stress: No Stress Concern Present  . Feeling of Stress : Not at all  Social Connections: Unknown  . Frequency of Communication with Friends and Family: More than three times a week  . Frequency of Social Gatherings with Friends and Family: More than three times a week  . Attends Religious Services: More than 4 times per year  . Active Member of Clubs or Organizations: Yes  . Attends Archivist Meetings: Never  . Marital Status: Not on file  Intimate Partner Violence:   . Fear of Current or Ex-Partner: Not on file  . Emotionally Abused: Not on file  . Physically Abused: Not on file  . Sexually Abused: Not on file    Review of Systems: See HPI, otherwise negative ROS  Physical Exam: BP (!) 173/101   Pulse 67   Temp (!) 97.4 F (36.3 C) (Temporal)   Resp 20   Ht 6' 1.5" (1.867 m)   Wt 136.1 kg   SpO2 100%   BMI 39.04 kg/m  General:   Alert,  pleasant and cooperative in NAD Head:  Normocephalic and atraumatic. Neck:  Supple; no masses or thyromegaly. Lungs:  Clear throughout to auscultation, normal respiratory effort.    Heart:   +S1, +S2, Regular rate and rhythm, No edema. Abdomen:  Soft, nontender and nondistended. Normal bowel sounds, without guarding, and without rebound.   Neurologic:  Alert and  oriented x4;  grossly normal neurologically.  Impression/Plan: [name] is here for a colonoscopy to be performed for average risk screening.  Risks, benefits, limitations, and alternatives regarding  colonoscopy have been reviewed with the patient.  Questions have been answered.  All parties agreeable.  Virgel Manifold, MD  03/08/2020, 10:15 AM

## 2020-03-08 NOTE — Transfer of Care (Signed)
Immediate Anesthesia Transfer of Care Note  Patient: AYIDEN MILLIMAN  Procedure(s) Performed: COLONOSCOPY WITH PROPOFOL (N/A )  Patient Location: Endoscopy Unit  Anesthesia Type:General  Level of Consciousness: drowsy and patient cooperative  Airway & Oxygen Therapy: Patient Spontanous Breathing and Patient connected to face mask oxygen  Post-op Assessment: Report given to RN and Post -op Vital signs reviewed and stable  Post vital signs: Reviewed and stable  Last Vitals:  Vitals Value Taken Time  BP 93/60 03/08/20 1217  Temp    Pulse 93 03/08/20 1217  Resp 11 03/08/20 1217  SpO2 99 % 03/08/20 1217    Last Pain:  Vitals:   03/08/20 1009  TempSrc: Temporal  PainSc: 0-No pain         Complications: No complications documented.

## 2020-03-08 NOTE — Anesthesia Preprocedure Evaluation (Signed)
Anesthesia Evaluation  Patient identified by MRN, date of birth, ID band Patient awake    Reviewed: Allergy & Precautions, H&P , NPO status , Patient's Chart, lab work & pertinent test results  History of Anesthesia Complications Negative for: history of anesthetic complications  Airway Mallampati: III  TM Distance: <3 FB Neck ROM: limited    Dental  (+) Chipped, Missing, Poor Dentition   Pulmonary neg shortness of breath, former smoker,    Pulmonary exam normal        Cardiovascular Exercise Tolerance: Good hypertension, (-) angina(-) Past MI and (-) DOE Normal cardiovascular exam     Neuro/Psych Seizures -,  PSYCHIATRIC DISORDERS  Neuromuscular disease CVA (right sided weakness), Residual Symptoms negative psych ROS   GI/Hepatic negative GI ROS, Neg liver ROS, neg GERD  ,  Endo/Other  diabetes, Type 2  Renal/GU negative Renal ROS  negative genitourinary   Musculoskeletal   Abdominal   Peds  Hematology negative hematology ROS (+)   Anesthesia Other Findings Past Medical History: No date: Depression No date: Diabetes mellitus without complication (HCC) No date: Hydrocephalus in adult [facility]) No date: Hyperlipidemia No date: Hypertension No date: Seizures (HCC) No date: Stroke [facility])  Past Surgical History: 12/20/13: LOOP RECORDER IMPLANT     Comment:  MDT LinQ implanted by Dr Ladona Ridgel for cryptogenic stroke 12/20/2013: LOOP RECORDER IMPLANT; N/A     Comment:  Procedure: LOOP RECORDER IMPLANT;  Surgeon: Marinus Maw, MD;  Location: [facility] CATH LAB;  Service:               Cardiovascular;  Laterality: N/A;  BMI    Body Mass Index: 39.04 kg/m      Reproductive/Obstetrics negative OB ROS                             Anesthesia Physical Anesthesia Plan  ASA: III  Anesthesia Plan: General   Post-op Pain Management:    Induction: Intravenous  PONV Risk Score and Plan:  Propofol infusion and TIVA  Airway Management Planned: Natural Airway and Nasal Cannula  Additional Equipment:   Intra-op Plan:   Post-operative Plan:   Informed Consent: I have reviewed the patients History and Physical, chart, labs and discussed the procedure including the risks, benefits and alternatives for the proposed anesthesia with the patient or authorized representative who has indicated his/her understanding and acceptance.     Dental Advisory Given  Plan Discussed with: Anesthesiologist, CRNA and Surgeon  Anesthesia Plan Comments: (Patient consented for risks of anesthesia including but not limited to:  - adverse reactions to medications - risk of intubation if required - damage to eyes, teeth, lips or other oral mucosa - nerve damage due to positioning  - sore throat or hoarseness - Damage to heart, brain, nerves, lungs, other parts of body or loss of life  Patient voiced understanding.)        Anesthesia Quick Evaluation

## 2020-03-08 NOTE — Anesthesia Procedure Notes (Signed)
Procedure Name: General with mask airway Performed by: Fletcher-Harrison, Matthieu Loftus, CRNA Pre-anesthesia Checklist: Patient identified, Emergency Drugs available, Suction available and Patient being monitored Patient Re-evaluated:Patient Re-evaluated prior to induction Oxygen Delivery Method: Simple face mask Induction Type: IV induction Placement Confirmation: positive ETCO2 and CO2 detector Dental Injury: Teeth and Oropharynx as per pre-operative assessment        

## 2020-03-08 NOTE — Op Note (Signed)
[facility] Patient Name: [name] Procedure Date: 03/08/2020 10:10 AM MRN: [id number] Account #: [id number] Date of Birth: [birth year] Admit Type: Outpatient Age: 49 Room: [facility] ENDO ROOM 4 Gender: Male Note Status: Finalized Procedure:             Colonoscopy Indications:           Screening for colorectal malignant neoplasm Providers:             Jemaine Prokop B. Maximino Greenland MD, MD Referring MD:          Sallye Lat Md, MD (Referring MD) Medicines:             Monitored Anesthesia Care Complications:         No immediate complications. Procedure:             Pre-Anesthesia Assessment:                        - Prior to the procedure, a History and Physical was                         performed, and patient medications, allergies and                         sensitivities were reviewed. The patient's tolerance                         of previous anesthesia was reviewed.                        - The risks and benefits of the procedure and the                         sedation options and risks were discussed with the                         patient. All questions were answered and informed                         consent was obtained.                        - Patient identification and proposed procedure were                         verified prior to the procedure by the physician, the                         nurse, the anesthetist and the technician. The                         procedure was verified in the pre-procedure area in                         the procedure room in the endoscopy suite.                        - ASA Grade Assessment: II - A patient with mild  systemic disease.                        - After reviewing the risks and benefits, the patient                         was deemed in satisfactory condition to undergo the                         procedure.                        After obtaining informed consent, the  colonoscope was                         passed under direct vision. Throughout the procedure,                         the patient's blood pressure, pulse, and oxygen                         saturations were monitored continuously. The                         Colonoscope was introduced through the anus and                         advanced to the the cecum, identified by appendiceal                         orifice and ileocecal valve. The colonoscopy was                         performed with ease. The patient tolerated the                         procedure well. The quality of the bowel preparation                         was good. Findings:      The perianal and digital rectal examinations were normal.      The rectum, sigmoid colon, descending colon, transverse colon, ascending       colon and cecum appeared normal. Biopsies for histology were taken with       a cold forceps from the entire colon for evaluation of microscopic       colitis.      The retroflexed view of the distal rectum and anal verge was normal and       showed no anal or rectal abnormalities. Impression:            - The rectum, sigmoid colon, descending colon,                         transverse colon, ascending colon and cecum are                         normal. Biopsied.                        - The distal rectum and anal  verge are normal on                         retroflexion view. Recommendation:        - Discharge patient to home.                        - Resume previous diet.                        - Continue present medications.                        - Repeat colonoscopy in 10 years for screening                         purposes.                        - Return to primary care physician as previously                         scheduled.                        - The findings and recommendations were discussed with                         the patient.                        - The findings and recommendations  were discussed with                         the patient's family.                        - In the future, if patient develops new symptoms such                         as blood per rectum, abdominal pain, weight loss,                         altered bowel habits or any other reason for concern,                         patient should discuss this with thier PCP as they may                         need a GI referral at that time or evaluation for need                         for colonoscopy earlier than the recommended screening                         colonoscopy.                        In addition, if patient's family history of colon  cancer changes (no family history at this time) in the                         future, earlier screening may be indicated and patient                         should discuss this with PCP as well. Procedure Code(s):     --- Professional ---                        [phone], Colonoscopy, flexible; with biopsy, single or                         multiple Diagnosis Code(s):     --- Professional ---                        Z12.11, Encounter for screening for malignant neoplasm                         of colon CPT copyright 2019 [employer]. All rights reserved. The codes documented in this report are preliminary and upon coder review may  be revised to meet current compliance requirements.  Melodie Bouillon, MD Michel Bickers B. Maximino Greenland MD, MD 03/08/2020 12:15:11 PM This report has been signed electronically. Number of Addenda: 0 Note Initiated On: 03/08/2020 10:10 AM Scope Withdrawal Time: 0 hours 14 minutes 4 seconds  Total Procedure Duration: 0 hours 24 minutes 7 seconds  Estimated Blood Loss:  Estimated blood loss: none.      [facility]

## 2020-03-09 ENCOUNTER — Encounter: Payer: Self-pay | Admitting: Gastroenterology

## 2020-03-09 LAB — SURGICAL PATHOLOGY

## 2020-03-09 NOTE — Anesthesia Postprocedure Evaluation (Addendum)
Anesthesia Post Note  Patient: [name]  Procedure(s) Performed: COLONOSCOPY WITH PROPOFOL (N/A )  Patient location during evaluation: Endoscopy Anesthesia Type: General Level of consciousness: awake and alert Pain management: pain level controlled Vital Signs Assessment: post-procedure vital signs reviewed and stable Respiratory status: spontaneous breathing, nonlabored ventilation, respiratory function stable and patient connected to nasal cannula oxygen Cardiovascular status: blood pressure returned to baseline and stable Postop Assessment: no apparent nausea or vomiting Anesthetic complications: no   No complications documented.   Last Vitals:  Vitals:   03/08/20 1237 03/08/20 1247  BP: (!) 149/94 (!) 143/97  Pulse: 89 88  Resp: 13 15  Temp:    SpO2: 99% 100%    Last Pain:  Vitals:   03/08/20 1247  TempSrc:   PainSc: 0-No pain                 Cleda Mccreedy Azia Toutant

## 2020-03-16 ENCOUNTER — Ambulatory Visit: Payer: Medicare Other | Admitting: Nurse Practitioner

## 2020-03-16 ENCOUNTER — Telehealth: Payer: Self-pay

## 2020-03-16 NOTE — Telephone Encounter (Signed)
Called patient but discussed results with his POA-Jerry. [name] understood the information below and had no further questions.

## 2020-03-16 NOTE — Telephone Encounter (Signed)
-----   Message from Pasty Spillers, MD sent at 03/14/2020  1:30 PM EDT ----- [name] please let the patient know, the biopsies from his colon showed minimal colitis with no chronic changes.  This may be due to resolving infection, medication such as NSAIDs which he should avoid.  He should keep his follow-up appointment with [place] to rediscuss symptoms at that time and follow-up with pelvic floor physical therapy like we discussed.

## 2020-03-20 ENCOUNTER — Ambulatory Visit (INDEPENDENT_AMBULATORY_CARE_PROVIDER_SITE_OTHER): Payer: Medicare Other

## 2020-03-20 VITALS — Ht 73.0 in | Wt 325.0 lb

## 2020-03-20 DIAGNOSIS — Z0000 Encounter for general adult medical examination without abnormal findings: Secondary | ICD-10-CM

## 2020-03-20 NOTE — Progress Notes (Signed)
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 Screening  Completed    Health Maintenance  Health Maintenance Due  Topic Date Due  .  Hepatitis C Screening  Never done  . FOOT EXAM  09/15/2019  . INFLUENZA VACCINE  02/06/2020    Colorectal cancer screening: Completed 03/08/2020. Repeat every 10 years  Lung Cancer Screening: (Low Dose CT Chest recommended if Age 33-80 years, 30 pack-year currently smoking OR have quit w/in 15years.) does not qualify.   Lung Cancer Screening Referral: no  Additional Screening:  Hepatitis C Screening: does qualify;   Vision Screening: Recommended annual ophthalmology exams for early detection of glaucoma and other disorders of the eye. Is the patient up to date with their annual eye exam?  Yes  Who is the provider or what is the name of the office in which the patient attends annual eye exams? Does not remember If pt is not established with a provider, would they like to be referred to a provider to establish care? No .   Dental Screening: Recommended annual dental exams for proper oral hygiene  Community Resource Referral / Chronic Care Management: CRR required this visit?  No   CCM required this visit?  No      Plan:     I have personally reviewed and noted the following in the patient's chart:   . Medical and social history . Use of alcohol, tobacco or  illicit drugs  . Current medications and supplements . Functional ability and status . Nutritional status . Physical activity . Advanced directives . List of other physicians . Hospitalizations, surgeries, and ER visits in previous 12 months . Vitals . Screenings to include cognitive, depression, and falls . Referrals and appointments  In addition, I have reviewed and discussed with patient certain preventive protocols, quality metrics, and best practice recommendations. A written personalized care plan for preventive services as well as general preventive health recommendations were provided to patient.     Kellie Simmering, LPN   02/17/7516   Nurse Notes: 6 CIT not completed due to cognitive deficits noted. Brother answered all questions.

## 2020-03-20 NOTE — Patient Instructions (Signed)
Mr. [name] , Thank you for taking time to come for your Medicare Wellness Visit. I appreciate your ongoing commitment to your health goals. Please review the following plan we discussed and let me know if I can assist you in the future.   Screening recommendations/referrals: Colonoscopy: completed 03/08/2020 Recommended yearly ophthalmology/optometry visit for glaucoma screening and checkup Recommended yearly dental visit for hygiene and checkup  Vaccinations: Influenza vaccine: due Pneumococcal vaccine: completed 10/20/2006 Tdap vaccine: completed 10/16/2015 Shingles vaccine: n/a   Covid-19:  12/24/2019, 11/19/2019  Advanced directives: Advance directive discussed with you today.   Conditions/risks identified: none  Next appointment: Follow up in one year for your annual wellness visit   Preventive Care 40-64 Years, Male Preventive care refers to lifestyle choices and visits with your health care provider that can promote health and wellness. What does preventive care include?  A yearly physical exam. This is also called an annual well check.  Dental exams once or twice a year.  Routine eye exams. Ask your health care provider how often you should have your eyes checked.  Personal lifestyle choices, including:  Daily care of your teeth and gums.  Regular physical activity.  Eating a healthy diet.  Avoiding tobacco and drug use.  Limiting alcohol use.  Practicing safe sex.  Taking low-dose aspirin every day starting at age 79. What happens during an annual well check? The services and screenings done by your health care provider during your annual well check will depend on your age, overall health, lifestyle risk factors, and family history of disease. Counseling  Your health care provider may ask you questions about your:  Alcohol use.  Tobacco use.  Drug use.  Emotional well-being.  Home and relationship well-being.  Sexual activity.  Eating habits.  Work  and work Astronomer. Screening  You may have the following tests or measurements:  Height, weight, and BMI.  Blood pressure.  Lipid and cholesterol levels. These may be checked every 5 years, or more frequently if you are over 16 years old.  Skin check.  Lung cancer screening. You may have this screening every year starting at age 53 if you have a 30-pack-year history of smoking and currently smoke or have quit within the past 15 years.  Fecal occult blood test (FOBT) of the stool. You may have this test every year starting at age 68.  Flexible sigmoidoscopy or colonoscopy. You may have a sigmoidoscopy every 5 years or a colonoscopy every 10 years starting at age 59.  Prostate cancer screening. Recommendations will vary depending on your family history and other risks.  Hepatitis C blood test.  Hepatitis B blood test.  Sexually transmitted disease (STD) testing.  Diabetes screening. This is done by checking your blood sugar (glucose) after you have not eaten for a while (fasting). You may have this done every 1-3 years. Discuss your test results, treatment options, and if necessary, the need for more tests with your health care provider. Vaccines  Your health care provider may recommend certain vaccines, such as:  Influenza vaccine. This is recommended every year.  Tetanus, diphtheria, and acellular pertussis (Tdap, Td) vaccine. You may need a Td booster every 10 years.  Zoster vaccine. You may need this after age 74.  Pneumococcal 13-valent conjugate (PCV13) vaccine. You may need this if you have certain conditions and have not been vaccinated.  Pneumococcal polysaccharide (PPSV23) vaccine. You may need one or two doses if you smoke cigarettes or if you have certain conditions. Talk to  your health care provider about which screenings and vaccines you need and how often you need them. This information is not intended to replace advice given to you by your health care  provider. Make sure you discuss any questions you have with your health care provider. Document Released: 07/21/2015 Document Revised: 03/13/2016 Document Reviewed: 04/25/2015 Elsevier Interactive Patient Education  2017 [employer] Prevention in the Home Falls can cause injuries. They can happen to people of all ages. There are many things you can do to make your home safe and to help prevent falls. What can I do on the outside of my home?  Regularly fix the edges of walkways and driveways and fix any cracks.  Remove anything that might make you trip as you walk through a door, such as a raised step or threshold.  Trim any bushes or trees on the path to your home.  Use bright outdoor lighting.  Clear any walking paths of anything that might make someone trip, such as rocks or tools.  Regularly check to see if handrails are loose or broken. Make sure that both sides of any steps have handrails.  Any raised decks and porches should have guardrails on the edges.  Have any leaves, snow, or ice cleared regularly.  Use sand or salt on walking paths during winter.  Clean up any spills in your garage right away. This includes oil or grease spills. What can I do in the bathroom?  Use night lights.  Install grab bars by the toilet and in the tub and shower. Do not use towel bars as grab bars.  Use non-skid mats or decals in the tub or shower.  If you need to sit down in the shower, use a plastic, non-slip stool.  Keep the floor dry. Clean up any water that spills on the floor as soon as it happens.  Remove soap buildup in the tub or shower regularly.  Attach bath mats securely with double-sided non-slip rug tape.  Do not have throw rugs and other things on the floor that can make you trip. What can I do in the bedroom?  Use night lights.  Make sure that you have a light by your bed that is easy to reach.  Do not use any sheets or blankets that are too big for your  bed. They should not hang down onto the floor.  Have a firm chair that has side arms. You can use this for support while you get dressed.  Do not have throw rugs and other things on the floor that can make you trip. What can I do in the kitchen?  Clean up any spills right away.  Avoid walking on wet floors.  Keep items that you use a lot in easy-to-reach places.  If you need to reach something above you, use a strong step stool that has a grab bar.  Keep electrical cords out of the way.  Do not use floor polish or wax that makes floors slippery. If you must use wax, use non-skid floor wax.  Do not have throw rugs and other things on the floor that can make you trip. What can I do with my stairs?  Do not leave any items on the stairs.  Make sure that there are handrails on both sides of the stairs and use them. Fix handrails that are broken or loose. Make sure that handrails are as long as the stairways.  Check any carpeting to make sure that it  is firmly attached to the stairs. Fix any carpet that is loose or worn.  Avoid having throw rugs at the top or bottom of the stairs. If you do have throw rugs, attach them to the floor with carpet tape.  Make sure that you have a light switch at the top of the stairs and the bottom of the stairs. If you do not have them, ask someone to add them for you. What else can I do to help prevent falls?  Wear shoes that:  Do not have high heels.  Have rubber bottoms.  Are comfortable and fit you well.  Are closed at the toe. Do not wear sandals.  If you use a stepladder:  Make sure that it is fully opened. Do not climb a closed stepladder.  Make sure that both sides of the stepladder are locked into place.  Ask someone to hold it for you, if possible.  Clearly mark and make sure that you can see:  Any grab bars or handrails.  First and last steps.  Where the edge of each step is.  Use tools that help you move around (mobility  aids) if they are needed. These include:  Canes.  Walkers.  Scooters.  Crutches.  Turn on the lights when you go into a dark area. Replace any light bulbs as soon as they burn out.  Set up your furniture so you have a clear path. Avoid moving your furniture around.  If any of your floors are uneven, fix them.  If there are any pets around you, be aware of where they are.  Review your medicines with your doctor. Some medicines can make you feel dizzy. This can increase your chance of falling. Ask your doctor what other things that you can do to help prevent falls. This information is not intended to replace advice given to you by your health care provider. Make sure you discuss any questions you have with your health care provider. Document Released: 04/20/2009 Document Revised: 11/30/2015 Document Reviewed: 07/29/2014 Elsevier Interactive Patient Education  2017 [employer].

## 2020-03-22 ENCOUNTER — Ambulatory Visit (INDEPENDENT_AMBULATORY_CARE_PROVIDER_SITE_OTHER): Payer: Medicare Other | Admitting: General Practice

## 2020-03-22 ENCOUNTER — Telehealth: Payer: Medicare Other | Admitting: General Practice

## 2020-03-22 DIAGNOSIS — I10 Essential (primary) hypertension: Secondary | ICD-10-CM

## 2020-03-22 DIAGNOSIS — E1159 Type 2 diabetes mellitus with other circulatory complications: Secondary | ICD-10-CM | POA: Diagnosis not present

## 2020-03-22 DIAGNOSIS — E1169 Type 2 diabetes mellitus with other specified complication: Secondary | ICD-10-CM | POA: Diagnosis not present

## 2020-03-22 DIAGNOSIS — E785 Hyperlipidemia, unspecified: Secondary | ICD-10-CM | POA: Diagnosis not present

## 2020-03-22 DIAGNOSIS — G910 Communicating hydrocephalus: Secondary | ICD-10-CM

## 2020-03-22 DIAGNOSIS — G918 Other hydrocephalus: Secondary | ICD-10-CM

## 2020-03-22 DIAGNOSIS — E1142 Type 2 diabetes mellitus with diabetic polyneuropathy: Secondary | ICD-10-CM
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
## 2020-03-24 ENCOUNTER — Telehealth: Payer: Self-pay | Admitting: Nurse Practitioner

## 2020-03-24 NOTE — Telephone Encounter (Signed)
[name] pt's case worker dropped off an application for [employer] paratransit service. Put paper in back with clinical

## 2020-03-29 NOTE — Telephone Encounter (Signed)
Forms reviewed and placed in providers folder for signature when she returns.

## 2020-03-30 ENCOUNTER — Telehealth: Payer: Self-pay

## 2020-03-30 NOTE — Telephone Encounter (Signed)
    MA9/23/2021 1st Attempt  Name: [name]   MRN: [id number]   DOB: 1970-07-26   AGE: 49 y.o.   GENDER: male   PCP Aura Dials T, NP.   03/30/20 Unable to leave message on brother's voicemail numbe is no longer in service.  Left messag on [name] listed on [facility] 09/02/2018 patient's meals will be delivered starting today.  Will attemp to call again in the next few days. 03/30/20 Spoke with [name] at [employer]  3 meals will be delivered starting today and be delivered every Thursday between 1-4pm.     Shamar Kracke, AAS Paralegal, [facility] Care Guide . Embedded Care Coordination [facility] Health  Care Management  [address] [place], [place] [address] [email or web address]  [phone]   [email or web address]

## 2020-04-03 ENCOUNTER — Telehealth: Payer: Self-pay

## 2020-04-03 NOTE — Telephone Encounter (Signed)
     MA9/27/2021  Name: [name]   MRN: [id number]   DOB: 12/10/1970   AGE: 49 y.o.   GENDER: male   PCP [name], NP.   04/03/20 Spoke with patient's brother/caretaker patient did receive his meals from [employer] they started last Thursday 9/23.  No other resources are needed at this time. Closing referral.    Candie Gintz, AAS Paralegal, [facility] Care Guide . Embedded Care Coordination [facility] Health  Care Management  [address] [place], [place] [address] [email or web address]  [phone]   [email or web address]

## 2020-04-08 ENCOUNTER — Other Ambulatory Visit: Payer: Self-pay | Admitting: Nurse Practitioner

## 2020-04-12 NOTE — Telephone Encounter (Signed)
Left detailed voice message to pick up completed form.

## 2020-04-17 ENCOUNTER — Telehealth: Payer: Self-pay | Admitting: Licensed Clinical Social Worker

## 2020-04-17 ENCOUNTER — Telehealth: Payer: Self-pay

## 2020-04-17 NOTE — Telephone Encounter (Signed)
  Chronic Care Management    Clinical Social Work General Follow Up Note  04/17/2020 Name: [name] MRN: [id number] DOB: August 13, 1970  [name] is a 49 y.o. year old male who is a primary care patient of Cannady, Barbaraann Faster, NP. The CCM team was consulted for assistance with [employer] .   Review of patient status, including review of consultants reports, relevant laboratory and other test results, and collaboration with appropriate care team members and the patient's provider was performed as part of comprehensive patient evaluation and provision of chronic care management services.    LCSW completed CCM outreach attempt today but was unable to reach patient successfully. A HIPPA compliant voice message was left encouraging patient to return call once available. LCSW will ask Scheduling Care Guide to reschedule CCM SW appointment with patient as well.  Outpatient Encounter Medications as of 04/17/2020  Medication Sig  . amLODipine (NORVASC) 10 MG tablet TAKE 1 TABLET BY MOUTH ONCE DAILY  . ASPIR-LOW 81 MG EC tablet TAKE 1 TABLET BY MOUTH ONCE DAILY  . atorvastatin (LIPITOR) 80 MG tablet TAKE 1 TABLET BY MOUTH ONCE DAILY AT 6PM  . blood glucose meter kit and supplies KIT Dispense based on patient and insurance preference. Use up to four times daily as directed. (FOR ICD-9 250.00, 250.01).  . Blood Glucose Monitoring Suppl (ONETOUCH VERIO FLEX SYSTEM) w/Device KIT Use to check blood sugar at least 2-3 times a day, in morning fasting and then 2 hours after a meal.  . Blood Pressure Monitoring (BLOOD PRESSURE MONITOR AUTOMAT) DEVI Use to monitor blood pressure once daily.  . carvedilol (COREG) 6.25 MG tablet TAKE 1 TABLET BY MOUTH TWICE DAILY WITH A MEAL  . citalopram (CELEXA) 10 MG tablet Take 1 tablet (10 mg total) by mouth daily.  . clopidogrel (PLAVIX) 75 MG tablet TAKE 1 TABLET BY MOUTH ONCE DAILY WITH BREAKFAST  . donepezil (ARICEPT) 5 MG tablet Take 1 tablet by mouth daily.    Marland Kitchen gabapentin (NEURONTIN) 300 MG capsule Take 1 capsule (300 mg total) by mouth 3 (three) times daily.  Marland Kitchen glucose blood (ONETOUCH VERIO) test strip Use to check blood sugar at least 2-3 times a day, in morning fasting and 2 hours after a meal.  . Lancet Devices (ACCU-CHEK SOFTCLIX) lancets Use as instructed  . lisinopril (ZESTRIL) 5 MG tablet Take 1 tablet (5 mg total) by mouth daily.  . metFORMIN (GLUCOPHAGE-XR) 500 MG 24 hr tablet Take 2 tablets (1,000 mg total) by mouth in the morning and at bedtime.  . Na Sulfate-K Sulfate-Mg Sulf 17.5-3.13-1.6 GM/177ML SOLN At 5 PM the day before procedure take 1 bottle and 5 hours before procedure take 1 bottle.  . vitamin B-12 (CYANOCOBALAMIN) 1000 MCG tablet Take 1 tablet (1,000 mcg total) by mouth daily.   No facility-administered encounter medications on file as of 04/17/2020.   Follow Up Plan: Kelliher will reach out to patient to reschedule appointment.   Eula Fried, BSW, MSW, [facility] Care Management [email or web address] Phone: [phone]

## 2020-04-17 NOTE — Telephone Encounter (Signed)
Confirmed with front office staff that paperwork was picked up.

## 2020-05-03 ENCOUNTER — Other Ambulatory Visit: Payer: Self-pay

## 2020-05-03 ENCOUNTER — Ambulatory Visit: Payer: Medicare Other | Admitting: Gastroenterology

## 2020-05-03 ENCOUNTER — Encounter: Payer: Self-pay | Admitting: Gastroenterology

## 2020-05-03 ENCOUNTER — Ambulatory Visit (INDEPENDENT_AMBULATORY_CARE_PROVIDER_SITE_OTHER): Payer: Medicare Other | Admitting: Gastroenterology

## 2020-05-03 VITALS — BP 138/84 | HR 82 | Temp 98.1°F | Ht 73.0 in | Wt 300.0 lb

## 2020-05-03 DIAGNOSIS — R197 Diarrhea, unspecified: Secondary | ICD-10-CM | POA: Diagnosis not present

## 2020-05-03 DIAGNOSIS — R159 Full incontinence of feces: Secondary | ICD-10-CM

## 2020-05-03 MED ORDER — METAMUCIL 48.57 % PO POWD: 1.0000 | Freq: Every day | ORAL | 1 refills | Status: AC

## 2020-05-04 NOTE — Progress Notes (Signed)
Vonda Antigua, MD [address]  [place]  [place], [place] [address]  Main: [phone]  Fax: [phone]   Primary Care Physician: Venita Lick, NP   Chief Complaint  Patient presents with  . Diarrhea    Patient continues to have diarrhea.    HPI: [name] is a 49 y.o. male here for follow-up of fecal incontinence.  This was thought to be due to pelvic floor dysfunction and patient was referred to pelvic floor physical therapy.  Patient and family state they were able to get pelvic floor physical therapy at home and this helped, but patient continues to have fecal incontinence.  This only occurs once a day when he has bowel movement once a day.  He wears diapers.  No blood in stool.  No weight loss.  No abdominal pain.  No nausea or vomiting.  Good appetite.  Patient underwent colonoscopy since last visit which was normal.  Random colon biopsies showed patchy minimal active colitis with no chronic features.  Previous history: Patient presents for evaluation with his brother that he lives with.  History of stroke in 2015.  Brother helps provide history as patient has memory difficulty since the stroke.  They report fecal incontinence since the stroke.  Patient has one bowel movement every other day and sometimes it is soft, sometimes it is watery but they have noticed incontinence of feces.  No blood in stool.  No prior colonoscopy.  No family history of colon cancer.  No abdominal pain, nausea or vomiting or dysphagia.  Current Outpatient Medications  Medication Sig Dispense Refill  . amLODipine (NORVASC) 10 MG tablet TAKE 1 TABLET BY MOUTH ONCE DAILY 90 tablet 0  . ASPIR-LOW 81 MG EC tablet TAKE 1 TABLET BY MOUTH ONCE DAILY 30 tablet 0  . atorvastatin (LIPITOR) 80 MG tablet TAKE 1 TABLET BY MOUTH ONCE DAILY AT 6PM 90 tablet 3  . blood glucose meter kit and supplies KIT Dispense based on patient and insurance preference. Use up to four times daily as directed.  (FOR ICD-9 250.00, 250.01). 1 each 0  . Blood Glucose Monitoring Suppl (ONETOUCH VERIO FLEX SYSTEM) w/Device KIT Use to check blood sugar at least 2-3 times a day, in morning fasting and then 2 hours after a meal. 1 kit 1  . Blood Pressure Monitoring (BLOOD PRESSURE MONITOR AUTOMAT) DEVI Use to monitor blood pressure once daily. 1 each 1  . carvedilol (COREG) 6.25 MG tablet TAKE 1 TABLET BY MOUTH TWICE DAILY WITH A MEAL 180 tablet 3  . citalopram (CELEXA) 10 MG tablet Take 1 tablet (10 mg total) by mouth daily. 90 tablet 3  . clopidogrel (PLAVIX) 75 MG tablet TAKE 1 TABLET BY MOUTH ONCE DAILY WITH BREAKFAST 90 tablet 0  . donepezil (ARICEPT) 5 MG tablet Take 1 tablet by mouth daily.    Marland Kitchen gabapentin (NEURONTIN) 300 MG capsule Take 1 capsule (300 mg total) by mouth 3 (three) times daily. 270 capsule 3  . glucose blood (ONETOUCH VERIO) test strip Use to check blood sugar at least 2-3 times a day, in morning fasting and 2 hours after a meal. 100 each 12  . Lancet Devices (ACCU-CHEK SOFTCLIX) lancets Use as instructed 1 each 0  . lisinopril (ZESTRIL) 5 MG tablet Take 1 tablet (5 mg total) by mouth daily. 90 tablet 3  . metFORMIN (GLUCOPHAGE-XR) 500 MG 24 hr tablet Take 2 tablets (1,000 mg total) by mouth in the morning and at bedtime. [place]  tablet 3  . Na Sulfate-K Sulfate-Mg Sulf 17.5-3.13-1.6 GM/177ML SOLN At 5 PM the day before procedure take 1 bottle and 5 hours before procedure take 1 bottle. 354 mL 0  . vitamin B-12 (CYANOCOBALAMIN) 1000 MCG tablet Take 1 tablet (1,000 mcg total) by mouth daily. 30 tablet 5  . Psyllium (METAMUCIL) 48.57 % POWD Take 1 Dose by mouth daily. 1254 g 1   No current facility-administered medications for this visit.    Allergies as of 05/03/2020  . (No Known Allergies)    ROS:  General: Negative for anorexia, weight loss, fever, chills, fatigue, weakness. ENT: Negative for hoarseness, difficulty swallowing , nasal congestion. CV: Negative for chest pain, angina,  palpitations, dyspnea on exertion, peripheral edema.  Respiratory: Negative for dyspnea at rest, dyspnea on exertion, cough, sputum, wheezing.  GI: See history of present illness. GU:  Negative for dysuria, hematuria, urinary incontinence, urinary frequency, nocturnal urination.  Endo: Negative for unusual weight change.    Physical Examination:   BP 138/84   Pulse 82   Temp 98.1 F (36.7 C) (Oral)   Ht _0  (1.854 m)   Wt 300 lb (136.1 kg)   BMI 39.58 kg/m   General: Well-nourished, well-developed in no acute distress.  Eyes: No icterus. Conjunctivae pink. Mouth: Oropharyngeal mucosa moist and pink , no lesions erythema or exudate. Neck: Supple, Trachea midline Abdomen: Bowel sounds are normal, nontender, nondistended, no hepatosplenomegaly or masses, no abdominal bruits or hernia , no rebound or guarding.   Extremities: No lower extremity edema. No clubbing or deformities. Neuro: Alert and oriented x 3.  Grossly intact. Skin: Warm and dry, no jaundice.   Psych: Alert and cooperative, normal mood and affect.   Labs: CMP     Component Value Date/Time   NA 140 01/11/2020 1112   NA 136 07/07/2014 0625   K 4.3 01/11/2020 1112   K 3.7 07/07/2014 0625   CL 100 01/11/2020 1112   CL 102 07/07/2014 0625   CO2 23 01/11/2020 1112   CO2 28 07/07/2014 0625   GLUCOSE 128 (H) 01/11/2020 1112   GLUCOSE 165 (H) 08/28/2018 0530   GLUCOSE 181 (H) 07/07/2014 0625   BUN 10 01/11/2020 1112   BUN 11 07/07/2014 0625   CREATININE 1.08 01/11/2020 1112   CREATININE 1.19 07/07/2014 0625   CALCIUM 9.4 01/11/2020 1112   CALCIUM 8.3 (L) 07/07/2014 0625   PROT 7.0 03/04/2019 1111   PROT 7.3 07/07/2014 0625   ALBUMIN 4.8 03/04/2019 1111   ALBUMIN 3.8 07/07/2014 0625   AST 15 03/04/2019 1111   AST 10 (L) 07/07/2014 0625   ALT 15 03/04/2019 1111   ALT 15 07/07/2014 0625   ALKPHOS 76 03/04/2019 1111   ALKPHOS 77 07/07/2014 0625   BILITOT 0.4 03/04/2019 1111   BILITOT 0.3 07/07/2014 0625    GFRNONAA 81 01/11/2020 1112   GFRNONAA >60 07/07/2014 0625   GFRNONAA >60 12/15/2013 0408   GFRAA 94 01/11/2020 1112   GFRAA >60 07/07/2014 0625   GFRAA >60 12/15/2013 0408   Lab Results  Component Value Date   WBC 4.0 09/02/2018   HGB 13.1 09/02/2018   HCT 38.0 09/02/2018   MCV 89 09/02/2018   PLT 275 09/02/2018    Imaging Studies: No results found.  Assessment and Plan:   [name] is a 49 y.o. y/o male here for follow-up of fecal incontinence  Patchy minimal active colitis noted on previous biopsies, did not have any chronic features  Avoid NSAID  use such as Ibuprofen, Aleeve, advil, motrin, BC and Goodie powder, Naproxen, Meloxicam and others.   Continue pelvic floor physical therapy  Start Metamucil daily to help bulk stool  If no improvement in 3 -4 weeks, start Imodium as necessary.  Patient will have a phone follow-up with [place] prior to this  Obtain fecal pancreatic elastase to rule out EPI, and celiac panel as well      Dr Vonda Antigua

## 2020-05-05 LAB — CELIAC DISEASE PANEL
Endomysial IgA: NEGATIVE
IgA/Immunoglobulin A, Serum: 212 mg/dL (ref 90–386)
Transglutaminase IgA: <2 U/mL (ref 0–3)

## 2020-05-08 ENCOUNTER — Telehealth: Payer: Self-pay

## 2020-05-08 NOTE — Telephone Encounter (Signed)
Called patient to let him know that he did not have celiac disease and to do what Dr. Maximino Greenland had recommended for him to do if his symptoms persisted. Patient understood and had no questions.

## 2020-05-08 NOTE — Telephone Encounter (Signed)
-----   Message from Pasty Spillers, MD sent at 05/05/2020 11:20 AM EDT ----- Kandis Cocking please let the patient know, blood work does not show celiac disease

## 2020-05-11 ENCOUNTER — Other Ambulatory Visit: Payer: Self-pay | Admitting: Gastroenterology

## 2020-05-11 NOTE — Telephone Encounter (Signed)
Pt has been r/s and states that the [phone] is ok to call

## 2020-05-23 LAB — PANCREATIC ELASTASE, FECAL: Pancreatic Elastase, Fecal: 459 ug Elast./g (ref >200)

## 2020-05-31 ENCOUNTER — Telehealth: Payer: Self-pay | Admitting: General Practice

## 2020-05-31 ENCOUNTER — Telehealth: Payer: Self-pay

## 2020-05-31 NOTE — Telephone Encounter (Signed)
  Chronic Care Management   Outreach Note  05/31/2020 Name: [name] MRN: [id number] DOB: May 27, 1971  Referred by: [name], NP Reason for referral : Chronic Care Management (RNCM Chronic Disease Management and Care Corrdination Needs)   An unsuccessful telephone outreach was attempted today. The patient was referred to the case management team for assistance with care management and care coordination.   Follow Up Plan: The care management team will reach out to the patient again over the next 30 to 60 days.    Alto Denver RN, MSN, CCM Community Care Coordinator [place]  [facility] Mobile: [phone]

## 2020-06-12 ENCOUNTER — Other Ambulatory Visit: Payer: Self-pay | Admitting: Nurse Practitioner

## 2020-06-12 NOTE — Telephone Encounter (Signed)
Requested medication (s) are due for refill today: yes  Requested medication (s) are on the active medication list: yes  Last refill:  01/04/20  Future visit scheduled: no  Notes to clinic:  overdue lab work   Requested Prescriptions  Pending Prescriptions Disp Refills   clopidogrel (PLAVIX) 75 MG tablet [Pharmacy Med Name: CLOPIDOGREL BISULFATE 75 MG TAB] 90 tablet     Sig: TAKE 1 TABLET BY MOUTH ONCE DAILY WITH BREAKFAST      Hematology: Antiplatelets - clopidogrel Failed - 06/12/2020 12:45 PM      Failed - Evaluate AST, ALT within 2 months of therapy initiation.      Failed - ALT in normal range and within 360 days    ALT  Date Value Ref Range Status  03/04/2019 15 0 - 44 IU/L Final   SGPT (ALT)  Date Value Ref Range Status  07/07/2014 15 U/L Final    Comment:    14-63 NOTE: New Reference Range 01/25/14           Failed - AST in normal range and within 360 days    AST  Date Value Ref Range Status  03/04/2019 15 0 - 40 IU/L Final   SGOT(AST)  Date Value Ref Range Status  07/07/2014 10 (L) 15 - 37 Unit/L Final          Failed - HCT in normal range and within 180 days    Hematocrit  Date Value Ref Range Status  09/02/2018 38.0 37.5 - 51.0 % Final          Failed - HGB in normal range and within 180 days    Hemoglobin  Date Value Ref Range Status  09/02/2018 13.1 13.0 - 17.7 g/dL Final          Failed - PLT in normal range and within 180 days    Platelets  Date Value Ref Range Status  09/02/2018 275 150 - 450 x10E3/uL Final          Passed - Valid encounter within last 6 months    Recent Outpatient Visits           5 months ago Type 2 diabetes mellitus with diabetic polyneuropathy, without long-term current use of insulin ([place])   [place], [place] T, NP   6 months ago Urge incontinence of urine   [place], [name] T, NP   7 months ago Type 2 diabetes mellitus with diabetic polyneuropathy, without  long-term current use of insulin ([place])   [place], Megan P, DO   8 months ago Type 2 diabetes mellitus with diabetic polyneuropathy, without long-term current use of insulin ([place])   [place], [place] T, NP   1 year ago Type 2 diabetes mellitus with diabetic polyneuropathy, without long-term current use of insulin ([place])   [place], Barbaraann Faster, NP       Future Appointments             In 3 weeks Tahiliani, Lennette Bihari, MD [facility]             Refused Prescriptions Disp Refills   metFORMIN (GLUCOPHAGE-XR) 500 MG 24 hr tablet [Pharmacy Med Name: METFORMIN HCL ER 500 MG TAB] 180 tablet     Sig: TAKE 2 TABLETS BY MOUTH ONCE EVERY MORNING AND TAKE 2 TABLETS BY MOUTH AT BEDTIME      Endocrinology:  Diabetes - Biguanides Passed - 06/12/2020 12:45 PM  Passed - Cr in normal range and within 360 days    Creatinine  Date Value Ref Range Status  07/07/2014 1.19 0.60 - 1.30 mg/dL Final   Creatinine, Ser  Date Value Ref Range Status  01/11/2020 1.08 0.76 - 1.27 mg/dL Final          Passed - HBA1C is between 0 and 7.9 and within 180 days    Hemoglobin A1C  Date Value Ref Range Status  09/27/2015 6.5  Final   HB A1C (BAYER DCA - WAIVED)  Date Value Ref Range Status  01/11/2020 7.8 (H) <7.0 % Final    Comment:                                          Diabetic Adult            <7.0                                       Healthy Adult        4.3 - 5.7                                                           (DCCT/NGSP) American Diabetes Association's Summary of Glycemic Recommendations for Adults with Diabetes: Hemoglobin A1c <7.0%. More stringent glycemic goals (A1c <6.0%) may further reduce complications at the cost of increased risk of hypoglycemia.           Passed - AA eGFR in normal range and within 360 days    EGFR (African American)  Date Value Ref Range Status  07/07/2014 >60 >37mL/min  Final  12/15/2013 >60  Final   GFR calc Af Amer  Date Value Ref Range Status  01/11/2020 94 >59 mL/min/1.73 Final    Comment:    **[facility] currently reports eGFR in compliance with the current**   recommendations of the [employer]. [facility] will   update reporting as new guidelines are published from the NKF-ASN   Task force.    EGFR (Non-African Amer.)  Date Value Ref Range Status  07/07/2014 >60 >48mL/min Final    Comment:    eGFR values <73mL/min/1.73 m2 may be an indication of chronic kidney disease (CKD). Calculated eGFR, using the MRDR Study equation, is useful in  patients with stable renal function. The eGFR calculation will not be reliable in acutely ill patients when serum creatinine is changing rapidly. It is not useful in patients on dialysis. The eGFR calculation may not be applicable to patients at the low and high extremes of body sizes, pregnant women, and vegetarians.   12/15/2013 >60  Final    Comment:    eGFR values <72mL/min/1.73 m2 may be an indication of chronic kidney disease (CKD). Calculated eGFR is useful in patients with stable renal function. The eGFR calculation will not be reliable in acutely ill patients when serum creatinine is changing rapidly. It is not useful in  patients on dialysis. The eGFR calculation may not be applicable to patients at the low and high extremes of body sizes, pregnant women, and vegetarians.    GFR calc non Af Wyvonnia Lora  Date Value Ref Range Status  01/11/2020 81 >59 mL/min/1.73 Final          Passed - Valid encounter within last 6 months    Recent Outpatient Visits           5 months ago Type 2 diabetes mellitus with diabetic polyneuropathy, without long-term current use of insulin ([place])   [place], [place] T, NP   6 months ago Urge incontinence of urine   [place], [name] T, NP   7 months ago Type 2 diabetes mellitus with diabetic polyneuropathy,  without long-term current use of insulin ([place])   [place], Megan P, DO   8 months ago Type 2 diabetes mellitus with diabetic polyneuropathy, without long-term current use of insulin ([place])   [place], [place] T, NP   1 year ago Type 2 diabetes mellitus with diabetic polyneuropathy, without long-term current use of insulin ([place])   [place], Barbaraann Faster, NP       Future Appointments             In 3 weeks Tahiliani, Lennette Bihari, MD [place]

## 2020-06-12 NOTE — Telephone Encounter (Signed)
Patient over due for appointment. Was due for 3 month f/up in October. Routing to admin for appointment provider for refill.

## 2020-06-12 NOTE — Telephone Encounter (Signed)
Requested Prescriptions  Pending Prescriptions Disp Refills  . clopidogrel (PLAVIX) 75 MG tablet [Pharmacy Med Name: CLOPIDOGREL BISULFATE 75 MG TAB] 90 tablet     Sig: TAKE 1 TABLET BY MOUTH ONCE DAILY WITH BREAKFAST     Hematology: Antiplatelets - clopidogrel Failed - 06/12/2020 12:45 PM      Failed - Evaluate AST, ALT within 2 months of therapy initiation.      Failed - ALT in normal range and within 360 days    ALT  Date Value Ref Range Status  03/04/2019 15 0 - 44 IU/L Final   SGPT (ALT)  Date Value Ref Range Status  07/07/2014 15 U/L Final    Comment:    14-63 NOTE: New Reference Range 01/25/14          Failed - AST in normal range and within 360 days    AST  Date Value Ref Range Status  03/04/2019 15 0 - 40 IU/L Final   SGOT(AST)  Date Value Ref Range Status  07/07/2014 10 (L) 15 - 37 Unit/L Final         Failed - HCT in normal range and within 180 days    Hematocrit  Date Value Ref Range Status  09/02/2018 38.0 37.5 - 51.0 % Final         Failed - HGB in normal range and within 180 days    Hemoglobin  Date Value Ref Range Status  09/02/2018 13.1 13.0 - 17.7 g/dL Final         Failed - PLT in normal range and within 180 days    Platelets  Date Value Ref Range Status  09/02/2018 275 150 - 450 x10E3/uL Final         Passed - Valid encounter within last 6 months    Recent Outpatient Visits          5 months ago Type 2 diabetes mellitus with diabetic polyneuropathy, without long-term current use of insulin ([place])   [place], [place] T, NP   6 months ago Urge incontinence of urine   [place], [name] T, NP   7 months ago Type 2 diabetes mellitus with diabetic polyneuropathy, without long-term current use of insulin ([place])   [place], Megan P, DO   8 months ago Type 2 diabetes mellitus with diabetic polyneuropathy, without long-term current use of insulin ([place])   [place], [place] T, NP   1 year ago Type 2 diabetes mellitus with diabetic polyneuropathy, without long-term current use of insulin ([place])   [place], Barbaraann Faster, NP      Future Appointments            In 3 weeks Virgel Manifold, MD [place]           . metFORMIN (GLUCOPHAGE-XR) 500 MG 24 hr tablet [Pharmacy Med Name: METFORMIN HCL ER 500 MG TAB] 180 tablet     Sig: TAKE 2 TABLETS BY MOUTH ONCE EVERY MORNING AND TAKE 2 TABLETS BY MOUTH AT BEDTIME     Endocrinology:  Diabetes - Biguanides Passed - 06/12/2020 12:45 PM      Passed - Cr in normal range and within 360 days    Creatinine  Date Value Ref Range Status  07/07/2014 1.19 0.60 - 1.30 mg/dL Final   Creatinine, Ser  Date Value Ref Range Status  01/11/2020 1.08 0.76 - 1.27 mg/dL Final  Passed - HBA1C is between 0 and 7.9 and within 180 days    Hemoglobin A1C  Date Value Ref Range Status  09/27/2015 6.5  Final   HB A1C (BAYER DCA - WAIVED)  Date Value Ref Range Status  01/11/2020 7.8 (H) <7.0 % Final    Comment:                                          Diabetic Adult            <7.0                                       Healthy Adult        4.3 - 5.7                                                           (DCCT/NGSP) American Diabetes Association's Summary of Glycemic Recommendations for Adults with Diabetes: Hemoglobin A1c <7.0%. More stringent glycemic goals (A1c <6.0%) may further reduce complications at the cost of increased risk of hypoglycemia.          Passed - AA eGFR in normal range and within 360 days    EGFR (African American)  Date Value Ref Range Status  07/07/2014 >60 >62m/min Final  12/15/2013 >60  Final   GFR calc Af Amer  Date Value Ref Range Status  01/11/2020 94 >59 mL/min/1.73 Final    Comment:    **[facility] currently reports eGFR in compliance with the current**   recommendations of the [employer] [facility] will   update  reporting as new guidelines are published from the NKF-ASN   Task force.    EGFR (Non-African Amer.)  Date Value Ref Range Status  07/07/2014 >60 >625mmin Final    Comment:    eGFR values <6023min/1.73 m2 may be an indication of chronic kidney disease (CKD). Calculated eGFR, using the MRDR Study equation, is useful in  patients with stable renal function. The eGFR calculation will not be reliable in acutely ill patients when serum creatinine is changing rapidly. It is not useful in patients on dialysis. The eGFR calculation may not be applicable to patients at the low and high extremes of body sizes, pregnant women, and vegetarians.   12/15/2013 >60  Final    Comment:    eGFR values <21m22mn/1.73 m2 may be an indication of chronic kidney disease (CKD). Calculated eGFR is useful in patients with stable renal function. The eGFR calculation will not be reliable in acutely ill patients when serum creatinine is changing rapidly. It is not useful in  patients on dialysis. The eGFR calculation may not be applicable to patients at the low and high extremes of body sizes, pregnant women, and vegetarians.    GFR calc non Af Amer  Date Value Ref Range Status  01/11/2020 81 >59 mL/min/1.73 Final         Passed - Valid encounter within last 6 months    Recent Outpatient Visits          5 months ago Type 2 diabetes mellitus with diabetic polyneuropathy, without long-term current  use of insulin ([place])   [place], [place] T, NP   6 months ago Urge incontinence of urine   [facility] [place], [place] T, NP   7 months ago Type 2 diabetes mellitus with diabetic polyneuropathy, without long-term current use of insulin ([place])   [place], Megan P, DO   8 months ago Type 2 diabetes mellitus with diabetic polyneuropathy, without long-term current use of insulin ([place])   [place], Stoy T, NP   1 year ago Type 2  diabetes mellitus with diabetic polyneuropathy, without long-term current use of insulin ([place])   [place], Barbaraann Faster, NP      Future Appointments            In 3 weeks Tahiliani, Lennette Bihari, MD [place]

## 2020-06-23 ENCOUNTER — Ambulatory Visit: Payer: Medicare Other | Admitting: Licensed Clinical Social Worker

## 2020-06-23 DIAGNOSIS — E1142 Type 2 diabetes mellitus with diabetic polyneuropathy: Secondary | ICD-10-CM

## 2020-06-23 DIAGNOSIS — E1169 Type 2 diabetes mellitus with other specified complication: Secondary | ICD-10-CM

## 2020-06-23 DIAGNOSIS — Z8673 Personal history of transient ischemic attack (TIA), and cerebral infarction without residual deficits: Secondary | ICD-10-CM

## 2020-06-23 DIAGNOSIS — E1159 Type 2 diabetes mellitus with other circulatory complications: Secondary | ICD-10-CM

## 2020-06-23 DIAGNOSIS — G910 Communicating hydrocephalus: Secondary | ICD-10-CM

## 2020-06-23 NOTE — Chronic Care Management (AMB) (Signed)
Chronic Care Management    Clinical Social Work Follow Up Note  06/23/2020 Name: [name] MRN: [id number] DOB: 06/25/1971  [name] is a 49 y.o. year old male who is a primary care patient of [place]ady, Barbaraann Faster, NP. The CCM team was consulted for assistance with [employer]  and [employer].   Review of patient status, including review of consultants reports, other relevant assessments, and collaboration with appropriate care team members and the patient's provider was performed as part of comprehensive patient evaluation and provision of chronic care management services.    SDOH (Social Determinants of Health) assessments performed: Yes    Outpatient Encounter Medications as of 06/23/2020  Medication Sig  . amLODipine (NORVASC) 10 MG tablet TAKE 1 TABLET BY MOUTH ONCE DAILY  . ASPIR-LOW 81 MG EC tablet TAKE 1 TABLET BY MOUTH ONCE DAILY  . atorvastatin (LIPITOR) 80 MG tablet TAKE 1 TABLET BY MOUTH ONCE DAILY AT 6PM  . blood glucose meter kit and supplies KIT Dispense based on patient and insurance preference. Use up to four times daily as directed. (FOR ICD-9 250.00, 250.01).  . Blood Glucose Monitoring Suppl (ONETOUCH VERIO FLEX SYSTEM) w/Device KIT Use to check blood sugar at least 2-3 times a day, in morning fasting and then 2 hours after a meal.  . Blood Pressure Monitoring (BLOOD PRESSURE MONITOR AUTOMAT) DEVI Use to monitor blood pressure once daily.  . carvedilol (COREG) 6.25 MG tablet TAKE 1 TABLET BY MOUTH TWICE DAILY WITH A MEAL  . citalopram (CELEXA) 10 MG tablet Take 1 tablet (10 mg total) by mouth daily.  . clopidogrel (PLAVIX) 75 MG tablet TAKE 1 TABLET BY MOUTH ONCE DAILY WITH BREAKFAST  . donepezil (ARICEPT) 5 MG tablet Take 1 tablet by mouth daily.  Marland Kitchen gabapentin (NEURONTIN) 300 MG capsule Take 1 capsule (300 mg total) by mouth 3 (three) times daily.  Marland Kitchen glucose blood (ONETOUCH VERIO) test strip Use to check blood sugar at least 2-3 times a day, in  morning fasting and 2 hours after a meal.  . Lancet Devices (ACCU-CHEK SOFTCLIX) lancets Use as instructed  . lisinopril (ZESTRIL) 5 MG tablet Take 1 tablet (5 mg total) by mouth daily.  . metFORMIN (GLUCOPHAGE-XR) 500 MG 24 hr tablet Take 2 tablets (1,000 mg total) by mouth in the morning and at bedtime.  . Na Sulfate-K Sulfate-Mg Sulf 17.5-3.13-1.6 GM/177ML SOLN At 5 PM the day before procedure take 1 bottle and 5 hours before procedure take 1 bottle.  . vitamin B-12 (CYANOCOBALAMIN) 1000 MCG tablet Take 1 tablet (1,000 mcg total) by mouth daily.   No facility-administered encounter medications on file as of 06/23/2020.     Goals Addressed    . SW-Find Help in My Community       Timeframe:  Long-Range Goal Priority:  Medium Start Date:  06/23/20                           Expected End Date: 09/21/20                  Follow Up Date- 90 days from today on 06/23/20   - begin a notebook of services in my neighborhood or community - call 211 when I need some help - follow-up on any referrals for help I am given - think ahead to make sure my need does not become an emergency - make a note about what I need to have by  the phone or take with me, like an identification card or social security number have a back-up plan - have a back-up plan - make a list of family or friends that I can call    Why is this important?    Knowing how and where to find help for yourself or family in your neighborhood and community is an important skill.   You will want to take some steps to learn how.    Current Barriers:  . Financial constraints . Limited social support . Level of care concerns . ADL IADL limitations . Mental Health Concerns  . Social Isolation . Limited education about criss support resources within the area* . Limited access to caregiver . Cognitive Deficits . Memory Deficits . Lacks knowledge of community resource: financial assistance and support resources that are eligible for  patient to use.  Clinical Social Work Clinical Goal(s):  Marland Kitchen Over the next 90 days, client will work with SW to address concerns related to lack of self-care and ongoing financial barriers . Over the next 120 days, patient/caregiver will work with SW to address concerns related to lack of support/resource connection. LCSW will assist patient in gaining additional support/resource connection and community resource education in order to maintain health and mental health appropriately  .  Over the next 120 days, patient will demonstrate improved adherence to self care as evidenced by implementing healthy self-care into his daily routine such as: attending all medical appointments, deep breathing exercises, taking time for self-reflection, taking medications as prescribed, drinking water and daily exercise to improve mobility.  .  Over the next 120 days, patient will demonstrate improved health management independence as evidenced by implementing healthy self-care skills and positive support/resources into his daily routine to help cope with stressors and improve overall health and well-being  .              Over the next 120 days, patient or caregiver will verbalize basic understanding of depression/stress process and self health management plan as evidenced by his participation in development of long term plan of care and institution of self health management strategies  Interventions: . Patient interviewed and appropriate assessments performed . Provided patient's aide and patient with information about crisis support and financial assistance support resources within the area . Patient reports that he relocated to a nicer apartment and is residing with there with his brother who is an excellent support for him.  . Patient shares that he was able to get relief from his stomach issues. Health coping skill education provided for chronic health care management.  . Discussed plans with patient for ongoing care  management follow up and provided patient with direct contact information for care management team . Confirmed stable transportation to patient's upcoming medical appointments.  Nash Dimmer with patient's caregiver [name]  re: patient's need for additional resource support, DME and financial assistance.  . Confirmed that patient now has a payee representative  . Assisted patient/caregiver with obtaining information about health plan benefits . Provided education to patient/caregiver regarding level of care options. . Patient reports that his managing his stress very well. He reports implementing appropriate self-care including: taking medications as prescribed, eating healthy, drinking more water and sleeping at least 7-8 hours per night. . Confirmed with patient's caregiver that patient new pair of eyeglasses are working well for him . Confirmed with patient that meals are still being delivered to him by [employer].   Patient Self Care Activities:  . Attends  all scheduled provider appointments . Calls provider office for new concerns or questions  Please see past updates related to this goal by clicking on the "Past Updates" button in the selected goal     Follow Up Plan: SW will follow up with patient by phone over the next quarter  Eula Fried, [place], MSW, [email or web address] Phone: [phone]

## 2020-07-05 ENCOUNTER — Other Ambulatory Visit: Payer: Self-pay | Admitting: Nurse Practitioner

## 2020-07-05 ENCOUNTER — Telehealth: Payer: Self-pay

## 2020-07-05 NOTE — Telephone Encounter (Signed)
Last ordered on 06/12/20 with refills available. Refusing Plavix request at this time.

## 2020-07-05 NOTE — Telephone Encounter (Signed)
Plavix last ordered 06/12/20 #90 tabs-this medication is not needed refusing request at this time.  Norvasc 10 mg approved per protocol. Requested Prescriptions  Pending Prescriptions Disp Refills   clopidogrel (PLAVIX) 75 MG tablet [Pharmacy Med Name: CLOPIDOGREL BISULFATE 75 MG TAB] 90 tablet 0    Sig: TAKE 1 TABLET BY MOUTH ONCE DAILY WITH BREAKFAST     Hematology: Antiplatelets - clopidogrel Failed - 07/05/2020 11:23 AM      Failed - Evaluate AST, ALT within 2 months of therapy initiation.      Failed - ALT in normal range and within 360 days    ALT  Date Value Ref Range Status  03/04/2019 15 0 - 44 IU/L Final   SGPT (ALT)  Date Value Ref Range Status  07/07/2014 15 U/L Final    Comment:    14-63 NOTE: New Reference Range 01/25/14          Failed - AST in normal range and within 360 days    AST  Date Value Ref Range Status  03/04/2019 15 0 - 40 IU/L Final   SGOT(AST)  Date Value Ref Range Status  07/07/2014 10 (L) 15 - 37 Unit/L Final         Failed - HCT in normal range and within 180 days    Hematocrit  Date Value Ref Range Status  09/02/2018 38.0 37.5 - 51.0 % Final         Failed - HGB in normal range and within 180 days    Hemoglobin  Date Value Ref Range Status  09/02/2018 13.1 13.0 - 17.7 g/dL Final         Failed - PLT in normal range and within 180 days    Platelets  Date Value Ref Range Status  09/02/2018 275 150 - 450 x10E3/uL Final         Passed - Valid encounter within last 6 months    Recent Outpatient Visits          5 months ago Type 2 diabetes mellitus with diabetic polyneuropathy, without long-term current use of insulin (HCC)   [facility] Ruth, [place] T, NP   6 months ago Urge incontinence of urine   [facility] Theba, [name] T, NP   8 months ago Type 2 diabetes mellitus with diabetic polyneuropathy, without long-term current use of insulin (HCC)   [facility] Johnson, Megan P, DO   9  months ago Type 2 diabetes mellitus with diabetic polyneuropathy, without long-term current use of insulin (HCC)   [facility] [place]acier, Tollette T, NP   1 year ago Type 2 diabetes mellitus with diabetic polyneuropathy, without long-term current use of insulin (HCC)   [facility] Evaro, Dorie Rank, NP      Future Appointments            Tomorrow Pasty Spillers, MD [facility]   In 2 weeks MacDiarmid, Lorin Picket, MD [facility]            amLODipine (NORVASC) 10 MG tablet [Pharmacy Med Name: AMLODIPINE BESYLATE 10 MG TAB] 90 tablet 0    Sig: TAKE 1 TABLET BY MOUTH ONCE DAILY     Cardiovascular:  Calcium Channel Blockers Passed - 07/05/2020 11:23 AM      Passed - Last BP in normal range    BP Readings from Last 1 Encounters:  05/03/20 138/84         Passed - Valid encounter within last  6 months    Recent Outpatient Visits          5 months ago Type 2 diabetes mellitus with diabetic polyneuropathy, without long-term current use of insulin (HCC)   [facility] [place], Jewett T, NP   6 months ago Urge incontinence of urine   [facility] [place], Winfield T, NP   8 months ago Type 2 diabetes mellitus with diabetic polyneuropathy, without long-term current use of insulin (HCC)   [facility] Johnson, Megan P, DO   9 months ago Type 2 diabetes mellitus with diabetic polyneuropathy, without long-term current use of insulin (HCC)   [facility] [place], [name] T, NP   1 year ago Type 2 diabetes mellitus with diabetic polyneuropathy, without long-term current use of insulin (HCC)   [facility] [place], Dorie Rank, NP      Future Appointments            Tomorrow Pasty Spillers, MD [facility] [place]   In 2 weeks MacDiarmid, Lorin Picket, MD [facility]

## 2020-07-05 NOTE — Telephone Encounter (Signed)
[facility] sent a fax requesting for patient's Metformin be changed to a 90 day supply.

## 2020-07-05 NOTE — Telephone Encounter (Signed)
He will need up to date visit before 90 day supply sent.  Thank you:)

## 2020-07-06 ENCOUNTER — Telehealth (INDEPENDENT_AMBULATORY_CARE_PROVIDER_SITE_OTHER): Payer: Medicare Other | Admitting: Gastroenterology

## 2020-07-06 DIAGNOSIS — R159 Full incontinence of feces: Secondary | ICD-10-CM

## 2020-07-06 NOTE — Progress Notes (Signed)
Patrick Antigua, MD [address]  [place]  [place],  [address]  Main: [phone]  Fax: [phone]   Primary Care Physician: Venita Lick, NP  Virtual Visit via Telephone Note  I connected with patient on 07/06/20 at  2:45 PM EST by telephone and verified that I am speaking with the correct person using two identifiers.   I discussed the limitations, risks, security and privacy concerns of performing an evaluation and management service by telephone and the availability of in person appointments. I also discussed with the patient that there may be a patient responsible charge related to this service. The patient expressed understanding and agreed to proceed.  Location of Patient: Home Location of Provider: Home Persons involved: Patient and provider only during the visit (nursing staff and front desk staff was involved in communicating with the patient prior to the appointment, reviewing medications and checking them in)   History of Present Illness: Chief complaint: Incontinence  HPI: [name] is a 49 y.o. male previously seen for fecal incontinence and pelvic floor physical therapy was recommended. Patient received this and symptoms had improved. Patient is reporting resolution of symptoms at this time. Denies any further episodes of incontinence since his last visit with [place]. Reports one formed bowel movement daily. No blood in stool. No abdominal pain. No weight loss. No nausea or vomiting. Good appetite.  Previous history: Patient underwent colonoscopy since last visit which was normal.  Random colon biopsies showed patchy minimal active colitis with no chronic features.  Fecal pancreatic elastase was normal TTG IgA negative, total IgA normal and endomysial IgA were negative  Current Outpatient Medications  Medication Sig Dispense Refill  . amLODipine (NORVASC) 10 MG tablet TAKE 1 TABLET BY MOUTH ONCE DAILY 90 tablet 0  . ASPIR-LOW 81 MG EC  tablet TAKE 1 TABLET BY MOUTH ONCE DAILY 30 tablet 0  . atorvastatin (LIPITOR) 80 MG tablet TAKE 1 TABLET BY MOUTH ONCE DAILY AT 6PM 90 tablet 3  . blood glucose meter kit and supplies KIT Dispense based on patient and insurance preference. Use up to four times daily as directed. (FOR ICD-9 250.00, 250.01). 1 each 0  . Blood Glucose Monitoring Suppl (ONETOUCH VERIO FLEX SYSTEM) w/Device KIT Use to check blood sugar at least 2-3 times a day, in morning fasting and then 2 hours after a meal. 1 kit 1  . Blood Pressure Monitoring (BLOOD PRESSURE MONITOR AUTOMAT) DEVI Use to monitor blood pressure once daily. 1 each 1  . carvedilol (COREG) 6.25 MG tablet TAKE 1 TABLET BY MOUTH TWICE DAILY WITH A MEAL 180 tablet 3  . citalopram (CELEXA) 10 MG tablet Take 1 tablet (10 mg total) by mouth daily. 90 tablet 3  . clopidogrel (PLAVIX) 75 MG tablet TAKE 1 TABLET BY MOUTH ONCE DAILY WITH BREAKFAST 90 tablet 0  . donepezil (ARICEPT) 5 MG tablet Take 1 tablet by mouth daily.    Marland Kitchen gabapentin (NEURONTIN) 300 MG capsule Take 1 capsule (300 mg total) by mouth 3 (three) times daily. 270 capsule 3  . glucose blood (ONETOUCH VERIO) test strip Use to check blood sugar at least 2-3 times a day, in morning fasting and 2 hours after a meal. 100 each 12  . Lancet Devices (ACCU-CHEK SOFTCLIX) lancets Use as instructed 1 each 0  . lisinopril (ZESTRIL) 5 MG tablet Take 1 tablet (5 mg total) by mouth daily. 90 tablet 3  . metFORMIN (GLUCOPHAGE-XR) 500 MG 24 hr tablet Take 2  tablets (1,000 mg total) by mouth in the morning and at bedtime. 180 tablet 3  . Na Sulfate-K Sulfate-Mg Sulf 17.5-3.13-1.6 GM/177ML SOLN At 5 PM the day before procedure take 1 bottle and 5 hours before procedure take 1 bottle. 354 mL 0  . vitamin B-12 (CYANOCOBALAMIN) 1000 MCG tablet Take 1 tablet (1,000 mcg total) by mouth daily. 30 tablet 5   No current facility-administered medications for this visit.    Allergies as of 07/06/2020  . (No Known  Allergies)    Review of Systems:    All systems reviewed and negative except where noted in HPI.   Observations/Objective:  Labs: CMP     Component Value Date/Time   NA 140 01/11/2020 1112   NA 136 07/07/2014 0625   K 4.3 01/11/2020 1112   K 3.7 07/07/2014 0625   CL 100 01/11/2020 1112   CL 102 07/07/2014 0625   CO2 23 01/11/2020 1112   CO2 28 07/07/2014 0625   GLUCOSE 128 (H) 01/11/2020 1112   GLUCOSE 165 (H) 08/28/2018 0530   GLUCOSE 181 (H) 07/07/2014 0625   BUN 10 01/11/2020 1112   BUN 11 07/07/2014 0625   CREATININE 1.08 01/11/2020 1112   CREATININE 1.19 07/07/2014 0625   CALCIUM 9.4 01/11/2020 1112   CALCIUM 8.3 (L) 07/07/2014 0625   PROT 7.0 03/04/2019 1111   PROT 7.3 07/07/2014 0625   ALBUMIN 4.8 03/04/2019 1111   ALBUMIN 3.8 07/07/2014 0625   AST 15 03/04/2019 1111   AST 10 (L) 07/07/2014 0625   ALT 15 03/04/2019 1111   ALT 15 07/07/2014 0625   ALKPHOS 76 03/04/2019 1111   ALKPHOS 77 07/07/2014 0625   BILITOT 0.4 03/04/2019 1111   BILITOT 0.3 07/07/2014 0625   GFRNONAA 81 01/11/2020 1112   GFRNONAA >60 07/07/2014 0625   GFRNONAA >60 12/15/2013 0408   GFRAA 94 01/11/2020 1112   GFRAA >60 07/07/2014 0625   GFRAA >60 12/15/2013 0408   Lab Results  Component Value Date   WBC 4.0 09/02/2018   HGB 13.1 09/02/2018   HCT 38.0 09/02/2018   MCV 89 09/02/2018   PLT 275 09/02/2018    Imaging Studies: No results found.  Assessment and Plan:   [name] is a 49 y.o. y/o male here for follow-up of fecal incontinence which has now resolved  Assessment and Plan: Patient reports resolution of symptoms at this time  Continue pelvic floor physical therapy as needed  Patient denies any diarrhea  Patient has no further questions at this time  I have encouraged him to call us back if symptoms return or if him or his family had any further questions or concerns    Follow Up Instructions: 6 months   I discussed the assessment and treatment plan with  the patient. The patient was provided an opportunity to ask questions and all were answered. The patient agreed with the plan and demonstrated an understanding of the instructions.   The patient was advised to call back or seek an in-person evaluation if the symptoms worsen or if the condition fails to improve as anticipated.  I provided 11 minutes of non-face-to-face time during this encounter. Additional time was spent in reviewing patient's chart, placing orders etc.   Virgel Manifold, MD  Speech recognition software was used to dictate this note.

## 2020-07-13 ENCOUNTER — Other Ambulatory Visit: Payer: Self-pay

## 2020-07-13 ENCOUNTER — Emergency Department: Payer: Medicare Other

## 2020-07-13 ENCOUNTER — Emergency Department
Admission: EM | Admit: 2020-07-13 | Discharge: 2020-07-13 | Disposition: A | Discharge status: Home / Self Care | Payer: Medicare Other | Attending: Emergency Medicine | Admitting: Emergency Medicine

## 2020-07-13 DIAGNOSIS — Z79899 Other long term (current) drug therapy: Secondary | ICD-10-CM | POA: Diagnosis not present

## 2020-07-13 DIAGNOSIS — W19XXXA Unspecified fall, initial encounter: Secondary | ICD-10-CM | POA: Diagnosis not present

## 2020-07-13 DIAGNOSIS — Z7982 Long term (current) use of aspirin: Secondary | ICD-10-CM | POA: Diagnosis not present

## 2020-07-13 DIAGNOSIS — R531 Weakness: Secondary | ICD-10-CM | POA: Diagnosis not present

## 2020-07-13 DIAGNOSIS — E1142 Type 2 diabetes mellitus with diabetic polyneuropathy: Secondary | ICD-10-CM | POA: Diagnosis not present

## 2020-07-13 DIAGNOSIS — I10 Essential (primary) hypertension: Secondary | ICD-10-CM | POA: Diagnosis not present

## 2020-07-13 DIAGNOSIS — E1121 Type 2 diabetes mellitus with diabetic nephropathy: Secondary | ICD-10-CM | POA: Insufficient documentation

## 2020-07-13 DIAGNOSIS — Z87891 Personal history of nicotine dependence: Secondary | ICD-10-CM | POA: Insufficient documentation

## 2020-07-13 DIAGNOSIS — Z7984 Long term (current) use of oral hypoglycemic drugs: Secondary | ICD-10-CM | POA: Diagnosis not present

## 2020-07-13 LAB — BASIC METABOLIC PANEL
Anion gap: 12 (ref 5–15)
BUN: 11 mg/dL (ref 6–20)
CO2: 29 mmol/L (ref 22–32)
Calcium: 9.4 mg/dL (ref 8.9–10.3)
Chloride: 100 mmol/L (ref 98–111)
Creatinine, Ser: 1.2 mg/dL (ref 0.61–1.24)
GFR, Estimated: >60 mL/min (ref >60)
Glucose, Bld: 145 mg/dL — ABNORMAL HIGH (ref 70–99)
Potassium: 4.1 mmol/L (ref 3.5–5.1)
Sodium: 141 mmol/L (ref 135–145)

## 2020-07-13 LAB — CBC
HCT: 41.4 % (ref 39.0–52.0)
Hemoglobin: 13.6 g/dL (ref 13.0–17.0)
MCH: 30.9 pg (ref 26.0–34.0)
MCHC: 32.9 g/dL (ref 30.0–36.0)
MCV: 94.1 fL (ref 80.0–100.0)
Platelets: 205 10*3/uL (ref 150–400)
RBC: 4.4 MIL/uL (ref 4.22–5.81)
RDW: 13.9 % (ref 11.5–15.5)
WBC: 4.2 10*3/uL (ref 4.0–10.5)
nRBC: 0 % (ref 0.0–0.2)

## 2020-07-13 LAB — URINALYSIS, COMPLETE (UACMP) WITH MICROSCOPIC
Bacteria, UA: NONE SEEN
Bilirubin Urine: NEGATIVE
Glucose, UA: NEGATIVE mg/dL
Hgb urine dipstick: NEGATIVE
Ketones, ur: NEGATIVE mg/dL
Leukocytes,Ua: NEGATIVE
Nitrite: NEGATIVE
Protein, ur: 100 mg/dL — AB
Specific Gravity, Urine: 1.018 (ref 1.005–1.030)
Squamous Epithelial / HPF: NONE SEEN (ref 0–5)
pH: 7 (ref 5.0–8.0)

## 2020-07-13 NOTE — ED Provider Notes (Signed)
[facility] Emergency Department Provider Note ____________________________________________   Event Date/Time   First MD Initiated Contact with Patient 07/13/20 1259     (approximate)  I have reviewed the triage vital signs and the nursing notes.   HISTORY  Chief Complaint Weakness  HPI NUR KRASINSKI is a 50 y.o. male with history of diabetes, hydrocephalus, hypertension, CVA, and seizures presents to the emergency department for treatment and evaluation after sustaining a fall earlier today.  Patient denies pain.  He denies striking his head.  He states that he has felt a little weaker than usual today.  He denies any Covid-like symptoms.         Past Medical History:  Diagnosis Date  . Depression   . Diabetes mellitus without complication ([place])   . Hydrocephalus in adult [facility])   . Hyperlipidemia   . Hypertension   . Seizures ([place])   . Stroke [facility])     Patient Active Problem List   Diagnosis Date Noted  . Special screening for malignant neoplasms, colon   . Type 2 diabetes mellitus with proteinuria ([place]) 12/31/2019  . Incontinence of feces 12/10/2019  . Urge incontinence of urine 12/10/2019  . Chronic communicating hydrocephalus ([place]) 04/22/2019  . Vitamin B12 deficiency 12/02/2018  . Chronic pain of right ankle 12/18/2015  . Obesity 10/16/2015  . Hyperlipidemia associated with type 2 diabetes mellitus ([place]) 09/13/2015  . History of stroke 12/17/2013  . Hypertension associated with diabetes ([place]) 12/17/2013  . Cardiomyopathy ([place]) 12/17/2013  . Type 2 diabetes mellitus with diabetic polyneuropathy ([place]) 12/17/2013    Past Surgical History:  Procedure Laterality Date  . COLONOSCOPY WITH PROPOFOL N/A 03/08/2020   Procedure: COLONOSCOPY WITH PROPOFOL;  Surgeon: Virgel Manifold, MD;  Location: [facility] ENDOSCOPY;  Service: Endoscopy;  Laterality: N/A;  . LOOP RECORDER IMPLANT  12/20/13   MDT LinQ implanted by Dr Lovena Le for cryptogenic stroke   . LOOP RECORDER IMPLANT N/A 12/20/2013   Procedure: LOOP RECORDER IMPLANT;  Surgeon: Evans Lance, MD;  Location: [facility] CATH LAB;  Service: Cardiovascular;  Laterality: N/A;    Prior to Admission medications   Medication Sig Start Date End Date Taking? Authorizing Provider  amLODipine (NORVASC) 10 MG tablet TAKE 1 TABLET BY MOUTH ONCE DAILY 07/05/20   Cannady, [name] T, NP  ASPIR-LOW 81 MG EC tablet TAKE 1 TABLET BY MOUTH ONCE DAILY 05/11/19   Cannady, [name] T, NP  atorvastatin (LIPITOR) 80 MG tablet TAKE 1 TABLET BY MOUTH ONCE DAILY AT 6PM 10/01/19   Cannady, Henrine Screws T, NP  blood glucose meter kit and supplies KIT Dispense based on patient and insurance preference. Use up to four times daily as directed. (FOR ICD-9 250.00, 250.01). 01/11/20   Cannady, Henrine Screws T, NP  Blood Glucose Monitoring Suppl (ONETOUCH VERIO FLEX SYSTEM) w/Device KIT Use to check blood sugar at least 2-3 times a day, in morning fasting and then 2 hours after a meal. 03/03/20   Cannady, [name] T, NP  Blood Pressure Monitoring (BLOOD PRESSURE MONITOR AUTOMAT) DEVI Use to monitor blood pressure once daily. 03/03/20   Cannady, Henrine Screws T, NP  carvedilol (COREG) 6.25 MG tablet TAKE 1 TABLET BY MOUTH TWICE DAILY WITH A MEAL 10/01/19   Cannady, [name] T, NP  citalopram (CELEXA) 10 MG tablet Take 1 tablet (10 mg total) by mouth daily. 10/01/19   Marnee Guarneri T, NP  clopidogrel (PLAVIX) 75 MG tablet TAKE 1 TABLET BY MOUTH ONCE DAILY WITH BREAKFAST 06/12/20   Cannady,  [name] T, NP  donepezil (ARICEPT) 5 MG tablet Take 1 tablet by mouth daily. 12/14/19   [provider]  gabapentin (NEURONTIN) 300 MG capsule Take 1 capsule (300 mg total) by mouth 3 (three) times daily. 10/01/19   Aura Dials T, NP  glucose blood (ONETOUCH VERIO) test strip Use to check blood sugar at least 2-3 times a day, in morning fasting and 2 hours after a meal. 03/03/20   [name], NP  Lancet Devices (ACCU-CHEK [place]) lancets Use as instructed 10/27/15    Ellyn Hack, MD  lisinopril (ZESTRIL) 5 MG tablet Take 1 tablet (5 mg total) by mouth daily. 10/01/19   Cannady, [name] T, NP  metFORMIN (GLUCOPHAGE-XR) 500 MG 24 hr tablet Take 2 tablets (1,000 mg total) by mouth in the morning and at bedtime. 10/20/19   Cannady, [name] T, NP  Na Sulfate-K Sulfate-Mg Sulf 17.5-3.13-1.6 GM/177ML SOLN At 5 PM the day before procedure take 1 bottle and 5 hours before procedure take 1 bottle. 03/02/20   Pasty Spillers, MD  vitamin B-12 (CYANOCOBALAMIN) 1000 MCG tablet Take 1 tablet (1,000 mcg total) by mouth daily. 09/03/18   [name], NP    Allergies Patient has no known allergies.  Family History  Problem Relation Age of Onset  . Stroke Father   . Heart disease Father   . Hypertension Father   . Hypertension Mother   . Diabetes Mother   . Arthritis Brother   . Diabetes Maternal Grandfather     Social History Social History   Tobacco Use  . Smoking status: Former Games developer  . Smokeless tobacco: Never Used  . Tobacco comment: > 4 years quit  Vaping Use  . Vaping Use: Never used  Substance Use Topics  . Alcohol use: Yes    Alcohol/week: 9.0 standard drinks    Types: 7 Cans of beer, 2 Standard drinks or equivalent per week    Comment: in a week  . Drug use: No    Review of Systems  Constitutional: No fever/chills Eyes: No visual changes. ENT: No sore throat. Cardiovascular: Denies chest pain. Respiratory: Denies shortness of breath. Gastrointestinal: No abdominal pain.  No nausea, no vomiting.  No diarrhea.  No constipation. Genitourinary: Negative for dysuria. Musculoskeletal: Negative for back pain.  Negative for hip pain Skin: Negative for rash. Neurological: Negative for headaches, focal weakness or numbness. ____________________________________________   PHYSICAL EXAM:  VITAL SIGNS: ED Triage Vitals  Enc Vitals Group     BP 07/13/20 0934 (!) 130/96     Pulse Rate 07/13/20 0934 99     Resp 07/13/20 0934 18      Temp 07/13/20 0934 98.3 F (36.8 C)     Temp Source 07/13/20 0934 Oral     SpO2 07/13/20 0934 98 %     Weight 07/13/20 0935 300 lb (136.1 kg)     Height 07/13/20 0935 6\' 1"  (1.854 m)     Head Circumference --      Peak Flow --      Pain Score 07/13/20 0935 0     Pain Loc --      Pain Edu? --      Excl. in GC? --     Constitutional: Alert and oriented. Well appearing and in no acute distress. Eyes: Conjunctivae are normal. Head: Atraumatic. Nose: No congestion/rhinnorhea. Mouth/Throat: Mucous membranes are moist. Oropharynx non-erythematous. Neck: No stridor.   Hematological/Lymphatic/Immunilogical: No cervical lymphadenopathy. Cardiovascular: Normal rate, regular rhythm. Grossly normal  heart sounds.  Good peripheral circulation. Respiratory: Normal respiratory effort.  No retractions. Lungs CTAB. Gastrointestinal: Soft and nontender. No distention. No abdominal bruits. Genitourinary:  Musculoskeletal: No hip pain on flexion, internal or external rotation. No lower extremity tenderness nor edema.  No joint effusions. Neurologic:  Normal speech and language. Slight weakness in right arm. Skin:  Skin is warm, dry and intact. No rash noted. Psychiatric: Mood and affect are normal. Speech and behavior are normal.  ____________________________________________   LABS (all labs ordered are listed, but only abnormal results are displayed)  Labs Reviewed  BASIC METABOLIC PANEL - Abnormal; Notable for the following components:      Result Value   Glucose, Bld 145 (*)    All other components within normal limits  URINALYSIS, COMPLETE (UACMP) WITH MICROSCOPIC - Abnormal; Notable for the following components:   Color, Urine YELLOW (*)    APPearance CLEAR (*)    Protein, ur 100 (*)    All other components within normal limits  CBC   ____________________________________________  EKG  ED ECG REPORT I, Kelliann Pendergraph, FNP-BC personally viewed and interpreted this ECG.   Date:  07/13/2020  EKG Time: 0933  Rate: 97  Rhythm: normal sinus rhythm  Axis: normal  Intervals: Prolonged QT  ST&T Change: no ST elevation  ____________________________________________  RADIOLOGY  ED MD interpretation:    Head CT is negative for acute concerns.  I, Sherrie George, personally viewed and evaluated these images (plain radiographs) as part of my medical decision making, as well as reviewing the written report by the radiologist.  Official radiology report(s): No results found.  ____________________________________________   PROCEDURES  Procedure(s) performed (including Critical Care):  Procedures  ____________________________________________   INITIAL IMPRESSION / ASSESSMENT AND PLAN     50 year old male presenting to the emergency department for treatment and evaluation after falling.  See HPI for further details.  DIFFERENTIAL DIAGNOSIS  ICH, CVA, electrolyte imbalance, acute cystitis, generalized weakness  ED COURSE  Labs are overall reassuring.  Urinalysis does not indicate infection. Plan will be to discharge back home as long as he is able to ambulate.  Unsteady gait. Will try to have him use a walker.  Brother at bedside who reports that this is not out of the ordinary for him to be unsteady. He will be discharged home with a walker and advised to follow up with primary care.    ___________________________________________   FINAL CLINICAL IMPRESSION(S) / ED DIAGNOSES  Final diagnoses:  Fall, initial encounter  Weakness     ED Discharge Orders    None       VEARL ALLBAUGH was evaluated in Emergency Department on 07/14/2020 for the symptoms described in the history of present illness. He was evaluated in the context of the global COVID-19 pandemic, which necessitated consideration that the patient might be at risk for infection with the SARS-CoV-2 virus that causes COVID-19. Institutional protocols and algorithms that pertain to the  evaluation of patients at risk for COVID-19 are in a state of rapid change based on information released by regulatory bodies including the CDC and federal and state organizations. These policies and algorithms were followed during the patient's care in the ED.   Note:  This document was prepared using Dragon voice recognition software and may include unintentional dictation errors.   Victorino Dike, FNP 07/14/20 1523    Vladimir Crofts, MD 07/14/20 [phone]

## 2020-07-13 NOTE — ED Notes (Signed)
*  Pt's deficit from previous stroke is on R side

## 2020-07-13 NOTE — ED Notes (Signed)
Attempted to ambulate pt; pt able to get himself up out of bed but is unsteady, unable to take a step forward. Implemented use of a walker, pt was able to takes steps and turn around; he does have tendency to lean backwards to an unsteady position but with reminders will correct.

## 2020-07-13 NOTE — ED Notes (Signed)
Pt provided with urinal for urine sample.

## 2020-07-13 NOTE — ED Notes (Signed)
Pt provided with walker and discharge instructions

## 2020-07-13 NOTE — ED Triage Notes (Addendum)
Pt comes into the ED via EMS from group home on [address], with c/o fall while going to the BR, not able to ambulate, CBG 169, 153/92, HR 92, 97%RA, RR23, deficits on the left side from previous stroke  Pt states he just feels very weak today, normally walks on his own, states he was not able to today. Pt denies any pain from the fall, denies any N/V/D. Pt does c/o some mild dizziness. denies hitting his head when he fell

## 2020-07-13 NOTE — Discharge Instructions (Signed)
Please follow up with primary care for symptoms that are not improving over the next few days.  Return to the ER for symptoms that change or worsen if unable to schedule an appointment.  

## 2020-07-18 ENCOUNTER — Encounter: Payer: Self-pay | Admitting: Nurse Practitioner

## 2020-07-18 ENCOUNTER — Ambulatory Visit (INDEPENDENT_AMBULATORY_CARE_PROVIDER_SITE_OTHER): Payer: Medicare Other | Admitting: Nurse Practitioner

## 2020-07-18 ENCOUNTER — Other Ambulatory Visit: Payer: Self-pay

## 2020-07-18 VITALS — BP 122/80 | HR 67 | Temp 98.3°F | Ht 72.99 in | Wt 274.0 lb

## 2020-07-18 DIAGNOSIS — E1159 Type 2 diabetes mellitus with other circulatory complications: Secondary | ICD-10-CM | POA: Diagnosis not present

## 2020-07-18 DIAGNOSIS — E785 Hyperlipidemia, unspecified: Secondary | ICD-10-CM

## 2020-07-18 DIAGNOSIS — R809 Proteinuria, unspecified: Secondary | ICD-10-CM

## 2020-07-18 DIAGNOSIS — E1142 Type 2 diabetes mellitus with diabetic polyneuropathy: Secondary | ICD-10-CM

## 2020-07-18 DIAGNOSIS — E538 Deficiency of other specified B group vitamins: Secondary | ICD-10-CM

## 2020-07-18 DIAGNOSIS — I152 Hypertension secondary to endocrine disorders: Secondary | ICD-10-CM

## 2020-07-18 DIAGNOSIS — R9431 Abnormal electrocardiogram [ECG] [EKG]: Secondary | ICD-10-CM

## 2020-07-18 DIAGNOSIS — G910 Communicating hydrocephalus: Secondary | ICD-10-CM

## 2020-07-18 DIAGNOSIS — E1169 Type 2 diabetes mellitus with other specified complication: Secondary | ICD-10-CM | POA: Diagnosis not present

## 2020-07-18 DIAGNOSIS — E1129 Type 2 diabetes mellitus with other diabetic kidney complication: Secondary | ICD-10-CM

## 2020-07-18 DIAGNOSIS — I429 Cardiomyopathy, unspecified: Secondary | ICD-10-CM

## 2020-07-18 DIAGNOSIS — R531 Weakness: Secondary | ICD-10-CM

## 2020-07-18 DIAGNOSIS — Z8673 Personal history of transient ischemic attack (TIA), and cerebral infarction without residual deficits: Secondary | ICD-10-CM

## 2020-07-18 LAB — MICROALBUMIN, URINE WAIVED
Creatinine, Urine Waived: 200 mg/dL (ref 10–300)
Microalb, Ur Waived: 150 mg/L — ABNORMAL HIGH (ref 0–19)

## 2020-07-18 LAB — BAYER DCA HB A1C WAIVED: HB A1C (BAYER DCA - WAIVED): 6.1 % (ref <7.0)

## 2020-07-18 MED ORDER — CARVEDILOL 6.25 MG PO TABS
ORAL_TABLET | ORAL | 4 refills | Status: DC
Start: 2020-07-18 — End: 2021-08-03

## 2020-07-18 MED ORDER — ONETOUCH VERIO FLEX SYSTEM W/DEVICE KIT: PACK | 1 refills | Status: DC

## 2020-07-18 MED ORDER — AMLODIPINE BESYLATE 10 MG PO TABS: 10.0000 mg | ORAL_TABLET | Freq: Every day | ORAL | 4 refills | Status: DC

## 2020-07-18 MED ORDER — CITALOPRAM HYDROBROMIDE 10 MG PO TABS: 10.0000 mg | ORAL_TABLET | Freq: Every day | ORAL | 4 refills | Status: DC

## 2020-07-18 MED ORDER — LISINOPRIL 5 MG PO TABS: 5.0000 mg | ORAL_TABLET | Freq: Every day | ORAL | 4 refills | Status: DC

## 2020-07-18 MED ORDER — CLOPIDOGREL BISULFATE 75 MG PO TABS: ORAL_TABLET | ORAL | 4 refills | Status: DC

## 2020-07-18 MED ORDER — VITAMIN B-12 1000 MCG PO TABS: 1000.0000 ug | ORAL_TABLET | Freq: Every day | ORAL | 4 refills | Status: DC

## 2020-07-18 MED ORDER — METFORMIN HCL ER 500 MG PO TB24: 1000.0000 mg | ORAL_TABLET | Freq: Two times a day (BID) | ORAL | 4 refills | Status: DC

## 2020-07-18 MED ORDER — ATORVASTATIN CALCIUM 80 MG PO TABS: ORAL_TABLET | ORAL | 4 refills | Status: DC

## 2020-07-18 MED ORDER — GABAPENTIN 300 MG PO CAPS: 300.0000 mg | ORAL_CAPSULE | Freq: Three times a day (TID) | ORAL | 4 refills | Status: DC

## 2020-07-18 MED ORDER — ONETOUCH VERIO VI STRP: ORAL_STRIP | 12 refills | Status: DC

## 2020-07-18 NOTE — Assessment & Plan Note (Signed)
Chronic, ongoing.  Continue current medication regimen and adjust as needed.  Lipid panel today.  Refills sent. Return in 3 months.

## 2020-07-18 NOTE — Patient Instructions (Signed)

## 2020-07-18 NOTE — Assessment & Plan Note (Signed)
Chronic, ongoing.  A1C 6.1% today.  Continue Metformin XR 1000MG  BID, he denies GI issues.  Due to history of hypoglycemia, which he has had in past, will avoid restart of Glipizide at this time and if elevation next visit consider SGLT or GLP add on. Continue Gabapentin for discomfort. Recommend he focus on diet, as still tends to snack a lot at home, and avoid alcohol use. Continue checking BS daily at home.  Home PT order placed for fall risk.  Return in 3 months.

## 2020-07-18 NOTE — Assessment & Plan Note (Addendum)
Chronic, ongoing.  BP at goal today.  Continue current medication regimen and adjust as needed at next visit.  Recommend checking BP daily at home and documenting for provider.  He has a BP cuff available at his home. BMP and TSH today.  Refills sent.  Return in 3 months for f/u.

## 2020-07-18 NOTE — Progress Notes (Signed)
BP 122/80   Pulse 67   Temp 98.3 F (36.8 C) (Oral)   Ht 6' 0.99" (1.854 m)   Wt 274 lb (124.3 kg)   SpO2 98%   BMI 36.16 kg/m    Subjective:    Patient ID: [name], male    DOB: January 18, 1971, 50 y.o.   MRN: [id number]  HPI: [name] is a 50 y.o. male  Chief Complaint  Patient presents with  . ER Follow-up     Fall and did not have strength enough to get back in bed. Patient just go weak all of the sudden   His brother is at bedside with him today -- currently patient lives with him and then he reports a home health person comes out every day to assist.    ER FOLLOW UP Was in ER on 07/13/20 post fall at home.  No head injury with fall.  Reported feeling a little weaker then usual.  On EKG in ER had NST with non specific T wave changes and prolonged QT, has history of stroke several years ago and has not seen cardiology recently.  Last saw neurology, Dr. Malvin Johns, 06/20/20 for chronic hydrocephalus with decline in memory noted, continues on Aricept -- followed by GI for stool incontinence issues and had referral to urology placed for neuro for urine incontinence.  No further falls since leaving hospital.  Patient reports legs do not feel weak at this time, states it was one episode. Time since discharge: 5 days Hospital/facility: [facility] Diagnosis: fall Procedures/tests: CT head -- no new infarcts Consultants: none New medications: none Discharge instructions:  Follow-up with PCP Status: stable   DIABETES Continues on Metformin XR 1000 MG BID with last A1C 7.8% in July 2021 with urine ALB 150, missed follow-up visits after July.  Endorses continued poor diet choices at times.  There was concern from friend reported to CCM team that patient was drinking, patient lives with his brother.  He denies heavy alcohol intake and reports only occasional, 1-2 drinks a week -- beer and liquor.  History of B12 deficiency with last level improved with supplement -- last level in March was  1157. Hypoglycemic episodes:no Polydipsia/polyuria: no Visual disturbance: no Chest pain: no Paresthesias: no Glucose Monitoring: yes  Accucheck frequency: not checking often -- thinks they have wrong strips  Fasting glucose:   Post prandial:  Evening:  Before meals: Taking Insulin?: no  Long acting insulin:  Short acting insulin: Blood Pressure Monitoring: daily Retinal Examination: Not up to Date Foot Exam: Up to Date Pneumovax: Up to Date Influenza: up to date Aspirin: yes   HYPERTENSION / HYPERLIPIDEMIA Continues on Amlodipine 10 MG, ASA, Plavix, Carvedilol 6.25 MG BID, Lisinopril 5 MG QDAY, and Lipitor 80 MG daily. Satisfied with current treatment? yes Duration of hypertension: chronic BP monitoring frequency: not checking BP range: not checking BP medication side effects: no Duration of hyperlipidemia: chronic Cholesterol medication side effects: no Cholesterol supplements: none Medication compliance: good compliance Aspirin: yes Recent stressors: no Recurrent headaches: no Visual changes: no Palpitations: no Dyspnea: no Chest pain: no Lower extremity edema: no Dizzy/lightheaded: no   Relevant past medical, surgical, family and social history reviewed and updated as indicated. Interim medical history since our last visit reviewed. Allergies and medications reviewed and updated.  Review of Systems  Constitutional: Negative for activity change, diaphoresis, fatigue and fever.  Respiratory: Negative for cough, chest tightness, shortness of breath and wheezing.   Cardiovascular: Negative for chest pain, palpitations  and leg swelling.  Gastrointestinal: Negative for abdominal distention, abdominal pain, constipation, diarrhea, nausea and vomiting.  Endocrine: Negative for cold intolerance, heat intolerance, polydipsia, polyphagia and polyuria.  Neurological: Negative for dizziness, syncope, weakness, light-headedness, numbness and headaches.   Psychiatric/Behavioral: Negative.     Per HPI unless specifically indicated above     Objective:    BP 122/80   Pulse 67   Temp 98.3 F (36.8 C) (Oral)   Ht 6' 0.99" (1.854 m)   Wt 274 lb (124.3 kg)   SpO2 98%   BMI 36.16 kg/m   Wt Readings from Last 3 Encounters:  07/18/20 274 lb (124.3 kg)  07/13/20 300 lb (136.1 kg)  05/03/20 300 lb (136.1 kg)    Physical Exam Vitals and nursing note reviewed.  Constitutional:      General: He is awake. He is not in acute distress.    Appearance: He is well-developed. He is obese. He is not ill-appearing.  HENT:     Head: Normocephalic and atraumatic.     Right Ear: Hearing normal. No drainage.     Left Ear: Hearing normal. No drainage.  Eyes:     General: Lids are normal.        Right eye: No discharge.        Left eye: No discharge.     Conjunctiva/sclera: Conjunctivae normal.     Pupils: Pupils are equal, round, and reactive to light.  Neck:     Thyroid: No thyromegaly.     Vascular: No carotid bruit.  Cardiovascular:     Rate and Rhythm: Normal rate and regular rhythm.     Heart sounds: Normal heart sounds, S1 normal and S2 normal. No murmur heard. No gallop.   Pulmonary:     Effort: Pulmonary effort is normal. No accessory muscle usage or respiratory distress.     Breath sounds: Normal breath sounds.  Abdominal:     General: Bowel sounds are normal.     Palpations: Abdomen is soft.  Musculoskeletal:        General: Normal range of motion.     Cervical back: Normal range of motion and neck supple.     Right lower leg: No edema.     Left lower leg: No edema.  Skin:    General: Skin is warm and dry.     Capillary Refill: Capillary refill takes less than 2 seconds.  Neurological:     Mental Status: He is alert and oriented to person, place, and time.  Psychiatric:        Attention and Perception: Attention normal.        Mood and Affect: Mood normal.        Speech: Speech normal.        Behavior: Behavior normal.  Behavior is cooperative.     Diabetic Foot Exam - Simple   Simple Foot Form Visual Inspection See comments: Yes Sensation Testing See comments: Yes Pulse Check See comments: Yes Comments PT/DP pulses bilaterally 1+.  Sensation 6/10 bilaterally.  Onychomycosis present.     Results for orders placed or performed during the hospital encounter of 07/13/20  Basic metabolic panel  Result Value Ref Range   Sodium 141 135 - 145 mmol/L   Potassium 4.1 3.5 - 5.1 mmol/L   Chloride 100 98 - 111 mmol/L   CO2 29 22 - 32 mmol/L   Glucose, Bld 145 (H) 70 - 99 mg/dL   BUN 11 6 - 20 mg/dL   Creatinine,  Ser 1.20 0.61 - 1.24 mg/dL   Calcium 9.4 8.9 - 30.0 mg/dL   GFR, Estimated >92 >33 mL/min   Anion gap 12 5 - 15  CBC  Result Value Ref Range   WBC 4.2 4.0 - 10.5 K/uL   RBC 4.40 4.22 - 5.81 MIL/uL   Hemoglobin 13.6 13.0 - 17.0 g/dL   HCT 00.7 62.2 - 63.3 %   MCV 94.1 80.0 - 100.0 fL   MCH 30.9 26.0 - 34.0 pg   MCHC 32.9 30.0 - 36.0 g/dL   RDW 35.4 56.2 - 56.3 %   Platelets 205 150 - 400 K/uL   nRBC 0.0 0.0 - 0.2 %  Urinalysis, Complete w Microscopic  Result Value Ref Range   Color, Urine YELLOW (A) YELLOW   APPearance CLEAR (A) CLEAR   Specific Gravity, Urine 1.018 1.005 - 1.030   pH 7.0 5.0 - 8.0   Glucose, UA NEGATIVE NEGATIVE mg/dL   Hgb urine dipstick NEGATIVE NEGATIVE   Bilirubin Urine NEGATIVE NEGATIVE   Ketones, ur NEGATIVE NEGATIVE mg/dL   Protein, ur 893 (A) NEGATIVE mg/dL   Nitrite NEGATIVE NEGATIVE   Leukocytes,Ua NEGATIVE NEGATIVE   RBC / HPF 0-5 0 - 5 RBC/hpf   WBC, UA 0-5 0 - 5 WBC/hpf   Bacteria, UA NONE SEEN NONE SEEN   Squamous Epithelial / LPF NONE SEEN 0 - 5      Assessment & Plan:   Problem List Items Addressed This Visit      Cardiovascular and Mediastinum   Hypertension associated with diabetes (HCC)    Chronic, ongoing.  BP at goal today.  Continue current medication regimen and adjust as needed at next visit.  Recommend checking BP daily at home  and documenting for provider.  He has a BP cuff available at his home. BMP and TSH today.  Refills sent.  Return in 3 months for f/u.      Relevant Medications   amLODipine (NORVASC) 10 MG tablet   atorvastatin (LIPITOR) 80 MG tablet   carvedilol (COREG) 6.25 MG tablet   lisinopril (ZESTRIL) 5 MG tablet   metFORMIN (GLUCOPHAGE-XR) 500 MG 24 hr tablet   Other Relevant Orders   Bayer DCA Hb A1c Waived   Microalbumin, Urine Waived   Comprehensive metabolic panel   TSH   Ambulatory referral to Cardiology   Cardiomyopathy [facility])    With recent abnormal EKG in ER setting and some weakness with fall, referral to cardiology placed.      Relevant Medications   amLODipine (NORVASC) 10 MG tablet   atorvastatin (LIPITOR) 80 MG tablet   carvedilol (COREG) 6.25 MG tablet   lisinopril (ZESTRIL) 5 MG tablet     Endocrine   Hyperlipidemia associated with type 2 diabetes mellitus (HCC)    Chronic, ongoing.  Continue current medication regimen and adjust as needed.  Lipid panel today.  Refills sent. Return in 3 months.      Relevant Medications   atorvastatin (LIPITOR) 80 MG tablet   lisinopril (ZESTRIL) 5 MG tablet   metFORMIN (GLUCOPHAGE-XR) 500 MG 24 hr tablet   Other Relevant Orders   Bayer DCA Hb A1c Waived   Comprehensive metabolic panel   Lipid Panel w/o Chol/HDL Ratio   Type 2 diabetes mellitus with diabetic polyneuropathy (HCC)    Chronic, ongoing.  A1C 6.1% today.  Continue Metformin XR 1000MG  BID, he denies GI issues.  Due to history of hypoglycemia, which he has had in past, will avoid restart of Glipizide  at this time and if elevation next visit consider SGLT or GLP add on. Continue Gabapentin for discomfort. Recommend he focus on diet, as still tends to snack a lot at home, and avoid alcohol use. Continue checking BS daily at home.  Home PT order placed for fall risk.  Return in 3 months.      Relevant Medications   atorvastatin (LIPITOR) 80 MG tablet   citalopram (CELEXA) 10 MG  tablet   gabapentin (NEURONTIN) 300 MG capsule   lisinopril (ZESTRIL) 5 MG tablet   metFORMIN (GLUCOPHAGE-XR) 500 MG 24 hr tablet   Type 2 diabetes mellitus with proteinuria (HCC) - Primary    Chronic, ongoing.  A1C at goal today at 6.1% & urine ALB 150, A1c trending down from previous. Continue Metformin XR 1000MG  BID, he denies GI issues.  May be able to reduce if remains stable.  Due to history of hypoglycemia, which he has had in past, will avoid restart of Glipizide at this time and if elevation next visit consider SGLT or GLP add on.  Recommend he focus on diet, as still tends to snack a lot at home, and avoid alcohol use. Continue checking BS daily at home, new glucometer script provided and educated brother and him on BS goals.  Continue Lisinopril for kidney protection.  Return in 3 months.      Relevant Medications   atorvastatin (LIPITOR) 80 MG tablet   lisinopril (ZESTRIL) 5 MG tablet   metFORMIN (GLUCOPHAGE-XR) 500 MG 24 hr tablet   Other Relevant Orders   Bayer DCA Hb A1c Waived     Nervous and Auditory   Chronic communicating hydrocephalus (HCC)    Ongoing, continue collaboration with neurology and current memory medication as ordered by them.        Other   History of stroke    Continue collaboration with neurology.      Relevant Orders   Ambulatory referral to Cardiology   Morbid obesity (HCC)    BMI 36.16 with T2DM, HTN.  Recommended eating smaller high protein, low fat meals more frequently and exercising 30 mins a day 5 times a week with a goal of 10-15lb weight loss in the next 3 months. Patient voiced their understanding and motivation to adhere to these recommendations.       Relevant Medications   metFORMIN (GLUCOPHAGE-XR) 500 MG 24 hr tablet   Vitamin B12 deficiency    Chronic, stable on recent check.  Continue daily supplement which is benefiting neuropathy.      Relevant Orders   Vitamin B12    Other Visit Diagnoses    Weakness       Referral for  home PT for strengthening.   Relevant Orders   Ambulatory referral to Home Health   Abnormal EKG       Referral to cardiology   Relevant Orders   Ambulatory referral to Cardiology      Follow up plan: Return in about 3 months (around 10/16/2020) for T2DM, HTN/HLD, MOOD.

## 2020-07-18 NOTE — Assessment & Plan Note (Signed)
Ongoing, continue collaboration with neurology and current memory medication as ordered by them.

## 2020-07-18 NOTE — Assessment & Plan Note (Signed)
With recent abnormal EKG in ER setting and some weakness with fall, referral to cardiology placed.

## 2020-07-18 NOTE — Assessment & Plan Note (Addendum)
Chronic, ongoing.  A1C at goal today at 6.1% & urine ALB 150, A1c trending down from previous. Continue Metformin XR 1000MG  BID, he denies GI issues.  May be able to reduce if remains stable.  Due to history of hypoglycemia, which he has had in past, will avoid restart of Glipizide at this time and if elevation next visit consider SGLT or GLP add on.  Recommend he focus on diet, as still tends to snack a lot at home, and avoid alcohol use. Continue checking BS daily at home, new glucometer script provided and educated brother and him on BS goals.  Continue Lisinopril for kidney protection.  Return in 3 months.

## 2020-07-18 NOTE — Assessment & Plan Note (Signed)
Continue collaboration with neurology. 

## 2020-07-18 NOTE — Assessment & Plan Note (Signed)
Chronic, stable on recent check.  Continue daily supplement which is benefiting neuropathy.

## 2020-07-18 NOTE — Assessment & Plan Note (Signed)
BMI 36.16 with T2DM, HTN.  Recommended eating smaller high protein, low fat meals more frequently and exercising 30 mins a day 5 times a week with a goal of 10-15lb weight loss in the next 3 months. Patient voiced their understanding and motivation to adhere to these recommendations.

## 2020-07-19 LAB — TSH: TSH: 0.793 u[IU]/mL (ref 0.450–4.500)

## 2020-07-19 LAB — COMPREHENSIVE METABOLIC PANEL
ALT: 18 IU/L (ref 0–44)
AST: 17 IU/L (ref 0–40)
Albumin/Globulin Ratio: 1.6 (ref 1.2–2.2)
Albumin: 4.6 g/dL (ref 4.0–5.0)
Alkaline Phosphatase: 71 IU/L (ref 44–121)
BUN/Creatinine Ratio: 9 (ref 9–20)
BUN: 9 mg/dL (ref 6–24)
Bilirubin Total: 0.8 mg/dL (ref 0.0–1.2)
CO2: 21 mmol/L (ref 20–29)
Calcium: 9.2 mg/dL (ref 8.7–10.2)
Chloride: 101 mmol/L (ref 96–106)
Creatinine, Ser: 0.97 mg/dL (ref 0.76–1.27)
GFR calc Af Amer: 107 mL/min/{1.73_m2} (ref >59)
GFR calc non Af Amer: 93 mL/min/{1.73_m2} (ref >59)
Globulin, Total: 2.8 g/dL (ref 1.5–4.5)
Glucose: 126 mg/dL — ABNORMAL HIGH (ref 65–99)
Potassium: 3.9 mmol/L (ref 3.5–5.2)
Sodium: 142 mmol/L (ref 134–144)
Total Protein: 7.4 g/dL (ref 6.0–8.5)

## 2020-07-19 LAB — LIPID PANEL W/O CHOL/HDL RATIO
Cholesterol, Total: 117 mg/dL (ref 100–199)
HDL: 46 mg/dL (ref >39)
LDL Chol Calc (NIH): 54 mg/dL (ref 0–99)
Triglycerides: 86 mg/dL (ref 0–149)
VLDL Cholesterol Cal: 17 mg/dL (ref 5–40)

## 2020-07-19 LAB — VITAMIN B12: Vitamin B-12: 1548 pg/mL — ABNORMAL HIGH (ref 232–1245)

## 2020-07-19 NOTE — Progress Notes (Signed)
Please alert Megan, may need to call [name] or brother if on DPR, that his labs have returned.  Everything remains stable and I recommend continuing all current medications.  Also please alert them that cardiology tried to call to schedule and was unable to contact.  Please see what is best contact for them to call and schedule, then alert Frances Furbish or [facility] of best contact.  Thanks.  I want to ensure he sees them for assessment.

## 2020-07-24 ENCOUNTER — Ambulatory Visit: Payer: Medicare Other | Admitting: Urology

## 2020-07-28 ENCOUNTER — Encounter: Payer: Self-pay | Admitting: Urology

## 2020-07-28 ENCOUNTER — Telehealth: Payer: Medicare Other | Admitting: General Practice

## 2020-07-28 ENCOUNTER — Ambulatory Visit: Payer: Medicare Other | Admitting: Cardiology

## 2020-07-28 ENCOUNTER — Ambulatory Visit: Payer: Medicare Other | Admitting: Urology

## 2020-07-28 ENCOUNTER — Ambulatory Visit: Payer: Self-pay | Admitting: General Practice

## 2020-07-28 DIAGNOSIS — Z8673 Personal history of transient ischemic attack (TIA), and cerebral infarction without residual deficits: Secondary | ICD-10-CM

## 2020-07-28 DIAGNOSIS — G910 Communicating hydrocephalus: Secondary | ICD-10-CM

## 2020-07-28 DIAGNOSIS — E785 Hyperlipidemia, unspecified: Secondary | ICD-10-CM

## 2020-07-28 DIAGNOSIS — E1129 Type 2 diabetes mellitus with other diabetic kidney complication: Secondary | ICD-10-CM

## 2020-07-28 DIAGNOSIS — E1159 Type 2 diabetes mellitus with other circulatory complications: Secondary | ICD-10-CM

## 2020-07-28 DIAGNOSIS — N3941 Urge incontinence: Secondary | ICD-10-CM

## 2020-07-28 DIAGNOSIS — G918 Other hydrocephalus: Secondary | ICD-10-CM

## 2020-07-28 NOTE — Chronic Care Management (AMB) (Signed)
Chronic Care Management   CCM RN Visit Note  07/28/2020 Name: [name] MRN: [id number] DOB: 02-17-1971  Subjective: [name] is a 50 y.o. year old male who is a primary care patient of Cannady, Barbaraann Faster, NP. The care management team was consulted for assistance with disease management and care coordination needs.    Engaged with patient by telephone for follow up visit in response to provider referral for case management and/or care coordination services.   Consent to Services:  The patient currently enrolled in CCM services   Patient agreed to services and verbal consent obtained.   Assessment: Review of patient past medical history, allergies, medications, health status, including review of consultants reports, laboratory and other test data, was performed as part of comprehensive evaluation and provision of chronic care management services.   SDOH (Social Determinants of Health) assessments and interventions performed:    CCM Care Plan  No Known Allergies  Outpatient Encounter Medications as of 07/28/2020  Medication Sig   amLODipine (NORVASC) 10 MG tablet Take 1 tablet (10 mg total) by mouth daily.   ASPIR-LOW 81 MG EC tablet TAKE 1 TABLET BY MOUTH ONCE DAILY   atorvastatin (LIPITOR) 80 MG tablet TAKE 1 TABLET BY MOUTH ONCE DAILY AT 6PM   Blood Glucose Monitoring Suppl ([place]) w/Device KIT Use to check blood sugar at least 2-3 times a day, in morning fasting and then 2 hours after a meal.   Blood Pressure Monitoring (BLOOD PRESSURE MONITOR AUTOMAT) DEVI Use to monitor blood pressure once daily.   carvedilol (COREG) 6.25 MG tablet TAKE 1 TABLET BY MOUTH TWICE DAILY WITH A MEAL   citalopram (CELEXA) 10 MG tablet Take 1 tablet (10 mg total) by mouth daily.   clopidogrel (PLAVIX) 75 MG tablet TAKE 1 TABLET BY MOUTH ONCE DAILY WITH BREAKFAST   donepezil (ARICEPT) 5 MG tablet Take 1 tablet by mouth daily.   gabapentin (NEURONTIN) 300 MG capsule  Take 1 capsule (300 mg total) by mouth 3 (three) times daily.   glucose blood (ONETOUCH VERIO) test strip Use to check blood sugar at least 2-3 times a day, in morning fasting and 2 hours after a meal.   Lancet Devices (ACCU-CHEK SOFTCLIX) lancets Use as instructed   lisinopril (ZESTRIL) 5 MG tablet Take 1 tablet (5 mg total) by mouth daily.   metFORMIN (GLUCOPHAGE-XR) 500 MG 24 hr tablet Take 2 tablets (1,000 mg total) by mouth in the morning and at bedtime.   Na Sulfate-K Sulfate-Mg Sulf 17.5-3.13-1.6 GM/177ML SOLN At 5 PM the day before procedure take 1 bottle and 5 hours before procedure take 1 bottle.   vitamin B-12 (CYANOCOBALAMIN) 1000 MCG tablet Take 1 tablet (1,000 mcg total) by mouth daily.   No facility-administered encounter medications on file as of 07/28/2020.    Patient Active Problem List   Diagnosis Date Noted   Type 2 diabetes mellitus with proteinuria ([place]) 12/31/2019   Incontinence of feces 12/10/2019   Urge incontinence of urine 12/10/2019   Chronic communicating hydrocephalus ([place]) 04/22/2019   Vitamin B12 deficiency 12/02/2018   Chronic pain of right ankle 12/18/2015   Morbid obesity ([place]) 10/16/2015   Hyperlipidemia associated with type 2 diabetes mellitus ([place]) 09/13/2015   History of stroke 12/17/2013   Hypertension associated with diabetes ([place]) 12/17/2013   Cardiomyopathy ([place]) 12/17/2013   Type 2 diabetes mellitus with diabetic polyneuropathy ([place]) 12/17/2013    Conditions to be addressed/monitored:HTN, HLD, DMII and post stroke, brain hydrocephalus, and  urinary incontience   Care Plan : RNCM: Management of HLD  Updates made by Vanita Ingles since 07/28/2020 12:00 AM    Problem: RNCM: Management of HLD   Priority: Medium    Goal: RNCM: HLD management   Priority: Medium  Note:   Current Barriers:   Poorly controlled hyperlipidemia, complicated by Brain syndrome, post CVA, ETOH use   Current antihyperlipidemic regimen: Atorvastatin  80 mg QD  Most recent lipid panel:     Component Value Date/Time   CHOL 117 07/18/2020 1028   CHOL 123 12/02/2018 1323   CHOL 205 (H) 12/15/2013 0408   TRIG 86 07/18/2020 1028   TRIG 73 12/02/2018 1323   TRIG 154 12/15/2013 0408   HDL 46 07/18/2020 1028   HDL 49 12/15/2013 0408   CHOLHDL 2.5 10/11/2015 1027   VLDL 15 12/02/2018 1323   VLDL 31 12/15/2013 0408   LDLCALC 54 07/18/2020 1028   LDLCALC 125 (H) 12/15/2013 0408     ASCVD risk enhancing conditions: age 31,  DM, HTN,  former smoker  Unable to independently manage HLD  Unable to self administer medications as prescribed  Does not attend all scheduled provider appointments  Does not adhere to prescribed medication regimen  Lacks social connections  Unable to perform ADLs independently  Unable to perform IADLs independently  Does not maintain contact with provider office  Does not contact provider office for questions/concerns  RN Care Manager Clinical Goal(s):   Over the next 120 days, patient will work with Consulting civil engineer, providers, and care team towards execution of optimized self-health management plan  Over the next 120 days, patient will verbalize understanding of plan for effective management of HLD  Over the next 120 days, patient will work with [facility] and pcp to address needs related to HLD  Over the next 120 days, patient will attend all scheduled medical appointments: 10-19-2020 and reschedule appointment with cardiology that was missed on 07-28-2020  Interventions:  Collaboration with Venita Lick, NP regarding development and update of comprehensive plan of care as evidenced by provider attestation and co-signature  Inter-disciplinary care team collaboration (see longitudinal plan of care)  Medication review performed; medication list updated in electronic medical record.   Inter-disciplinary care team collaboration (see longitudinal plan of care)  Referred to pharmacy team for  assistance with HLD medication management  Evaluation of current treatment plan related to HLD and patient's adherence to plan as established by provider.  Advised patient to call the office for changes in condition or quesitons  Provided education to patient re: following a heart healthy/ADA diet   Discussed plans with patient for ongoing care management follow up and provided patient with direct contact information for care management team  Reviewed scheduled/upcoming provider appointments including: 10-19-2020  Cut back on alcohol consumption   Patient Goals/Self-Care Activities:  Over the next 120 days, patient will:   - call for medicine refill 2 or 3 days before it runs out - call if I am sick and can't take my medicine - keep a list of all the medicines I take; vitamins and herbals too - learn to read medicine labels - use a pillbox to sort medicine - use an alarm clock or phone to remind me to take my medicine - change to whole grain breads, cereal, pasta - drink 6 to 8 glasses of water each day - eat 3 to 5 servings of fruits and vegetables each day - eat 5 or 6 small meals each  day - fill half the plate with nonstarchy vegetables - limit fast food meals to no more than 1 per week - manage portion size - prepare main meal at home 3 to 5 days each week - be open to making changes - I can manage, know and watch for signs of a heart attack - if I have chest pain, call for help - learn about small changes that will make a big difference - learn my personal risk factors  - calendar and clock use encouraged - cognitive-stimulating activities promoted - consistent daily routine encouraged - extra time for response allowed - memory aid use encouraged - repetition utilized - social relationships promoted - written communication utilized  Follow Up Plan: Telephone follow up appointment with care management team member scheduled for: 09-11-2020 at 0945 am     Task: RNCM:  HLD   Note:   Care Management Activities:    - calendar and clock use encouraged - cognitive-stimulating activities promoted - consistent daily routine encouraged - extra time for response allowed - memory aid use encouraged - repetition utilized - social relationships promoted - written communication utilized       Care Plan : RNCM: Urinary Incontinence (Adult)  Updates made by Vanita Ingles since 07/28/2020 12:00 AM    Problem: RNCM: Disorder Identification (Urinary Incontinence)   Priority: High    Goal: Urinary Incontinence Identified   Note:   Current Barriers:   Knowledge Deficits related to effective management of urge urinary incontinence   Chronic Disease Management support and education needs related to urinary incontinence   Lacks caregiver support.   Film/video editor.   Literacy barriers  Transportation barriers  Non-adherence to scheduled provider appointments  Non-adherence to prescribed medication regimen  Cognitive Deficits  Unable to independently manage urinary incontinence   Unable to self administer medications as prescribed  Does not attend all scheduled provider appointments  Does not adhere to prescribed medication regimen  Lacks social connections  Unable to perform IADLs independently  Does not maintain contact with provider office  Does not contact provider office for questions/concerns  Nurse Case Manager Clinical Goal(s):   Over the next 120 days, patient will verbalize understanding of plan for effective management of urinary incontinence   Over the next 120 days, patient will work with RNCM, pcp, and specialist to address needs related to urinary incontience   Over the next 120 days, patient will attend all scheduled medical appointments: patient had an appointment today to see specialist but had an "accident" and urinated on himself. [name] said he would have to reschedule his brothers appointment. Reminded him to  make sure he rescheduled the appointment. He was calling after call with RNCM.   Over the next 120 days, patient will demonstrate improved adherence to prescribed treatment plan for urinary incontinence  as evidenced bycontrol of incontinence episodes, decrease in accidents and effective management  Interventions:   1:1 collaboration with Venita Lick, NP regarding development and update of comprehensive plan of care as evidenced by provider attestation and co-signature  Inter-disciplinary care team collaboration (see longitudinal plan of care)  Evaluation of current treatment plan related to urinary incontinence  and patient's adherence to plan as established by provider.  Advised patient to call and get missed appointment rescheduled and the importance of follow up  Provided education to patient re: monitoring for urinary incontinence and frequency   Discussed plans with patient for ongoing care management follow up and provided patient with direct contact information  for care management team  Patient Goals/Self-Care Activities Over the next 120  days, patient will:  - Patient will self administer medications as prescribed Patient will attend all scheduled provider appointments Patient will attend church or other social activities Patient will continue to perform ADL's independently Patient will continue to perform IADL's independently Patient will call provider office for new concerns or questions Patient will work with BSW to address care coordination needs and will continue to work with the clinical team to address health care and disease management related needs.   - nature and severity of incontinence assessed - use of voiding and symptom diary use promoted  Follow Up Plan: Telephone follow up appointment with care management team member scheduled for: 09-08-2020 at 0945 am       Task: RNCM: Identify Presence and Type of Urinary Incontinence   Note:   Care Management  Activities:    - nature and severity of incontinence assessed - use of voiding and symptom diary use promoted       Care Plan : RNCM: Stroke (Adult)  Updates made by Vanita Ingles since 07/28/2020 12:00 AM    Problem: RNCM: Emotional Adjustment to Disease (Stroke) and Brain Hydrocephalus syndrome   Priority: Medium    Goal: RNCM: Optimal Coping: Stroke and Brain syndrome   Priority: Medium  Note:   Current Barriers:   Knowledge Deficits related to effective management of post CVA and brain hydrocephalus syndrome   Care Coordination needs related to community resource and support  in a patient with post CVA and Brain syndrome   Chronic Disease Management support and education needs related to management of post CVA and Brain hydrocephalus syndrome   Lacks caregiver support.   Literacy barriers  Non-adherence to scheduled provider appointments  Non-adherence to prescribed medication regimen  Cognitive Deficits  Unable to independently manage care and is dependant on his brother and other to help with his ADLs/IADLS, appointments, and other needs  Unable to self administer medications as prescribed  Does not attend all scheduled provider appointments  Does not adhere to prescribed medication regimen  Lacks social connections  Unable to perform IADLs independently  Does not maintain contact with provider office  Does not contact provider office for questions/concerns  Nurse Case Manager Clinical Goal(s):   Over the next 120 days, patient will verbalize understanding of plan for effective management of post CVA and brain hydrocephalus syndrome   Over the next 120 days, patient will work with [facility], CCM team, pcp, and specialist  to address needs related to changes in condition related to post CVA and brain hydrocephalus syndrome   Over the next 120 days, patient will attend all scheduled medical appointments: 10-19-2020 with pcp. Sees neurologist on a regular basis.    Interventions:   1:1 collaboration with Venita Lick, NP regarding development and update of comprehensive plan of care as evidenced by provider attestation and co-signature  Inter-disciplinary care team collaboration (see longitudinal plan of care)  Evaluation of current treatment plan related to post CVA care, and brain hydrocephalus syndrome  and patient's adherence to plan as established by provider.  Advised patient to call the provider for changes in condition or new quesitons   Provided education to patient re: safety in the home, evaluation of recent fall with ER visit   Discussed plans with patient for ongoing care management follow up and provided patient with direct contact information for care management team  Reviewed scheduled/upcoming provider appointments including: 10-19-2020 at 10:40  am  Patient Goals/Self-Care Activities Over the next 120 days, patient will:  - Patient will self administer medications as prescribed Patient will attend all scheduled provider appointments Patient will call pharmacy for medication refills Patient will continue to perform ADL's independently Patient will continue to perform IADL's independently Patient will call provider office for new concerns or questions Patient will work with BSW to address care coordination needs and will continue to work with the clinical team to address health care and disease management related needs.   - counseling provided - decision-making supported - depression screen reviewed - goal-setting facilitated - positive reinforcement provided - problem-solving facilitated - self-care encouraged - self-reflection promoted - verbalization of feelings encouraged  Follow Up Plan: Telephone follow up appointment with care management team member scheduled for: 09-08-2020 at 9:45 am        Task: RNCM: Support Psychosocial Response to Stroke   Note:   Care Management Activities:    - counseling  provided - decision-making supported - depression screen reviewed - goal-setting facilitated - positive reinforcement provided - problem-solving facilitated - self-care encouraged - self-reflection promoted - verbalization of feelings encouraged       Care Plan : RNCM: Hypertension (Adult)  Updates made by Vanita Ingles since 07/28/2020 12:00 AM    Problem: RNCM: Hypertension (Hypertension)   Priority: Medium    Goal: RNCM: Hypertension Monitored   Priority: Medium  Note:   Objective:   Last practice recorded BP readings:  BP Readings from Last 3 Encounters:  07/18/20 122/80  07/13/20 (!) 130/102  05/03/20 138/84     Most recent eGFR/CrCl: No results found for: EGFR  No components found for: CRCL Current Barriers:   Knowledge Deficits related to basic understanding of hypertension pathophysiology and self care management  Knowledge Deficits related to understanding of medications prescribed for management of hypertension  Non-adherence to prescribed medication regimen  Non-adherence to scheduled provider appointments  Transportation barriers  Cognitive Deficits  Limited Social Support  Unable to independently manage HTN  Unable to self administer medications as prescribed  Does not attend all scheduled provider appointments  Does not adhere to prescribed medication regimen  Lacks social connections  Unable to perform IADLs independently  Does not maintain contact with provider office  Does not contact provider office for questions/concerns Case Manager Clinical Goal(s):   Over the next 120 days, patient will verbalize understanding of plan for hypertension management  Over the next 120 days, patient will attend all scheduled medical appointments: 10-19-2020 with pcp, has to reschedule with cardiology, missed appointment for 07-28-2020  Over the next 120 days, patient will demonstrate improved adherence to prescribed treatment plan for hypertension as  evidenced by taking all medications as prescribed, monitoring and recording blood pressure as directed, adhering to low sodium/DASH diet  Over the next 120 days, patient will demonstrate improved health management independence as evidenced by checking blood pressure as directed and notifying PCP if SBP>160 or DBP > 90, taking all medications as prescribe, and adhering to a low sodium diet as discussed.  Over the next 120 days, patient will verbalize basic understanding of hypertension disease process and self health management plan as evidenced by effective management of HTN in the home setting, continuing to take blood pressures at home and calling for changes in condition  Interventions:   Collaboration with Venita Lick, NP regarding development and update of comprehensive plan of care as evidenced by provider attestation and co-signature  Inter-disciplinary care team collaboration (see longitudinal  plan of care)  Evaluation of current treatment plan related to hypertension self management and patient's adherence to plan as established by provider.  Provided education to patient re: stroke prevention, s/s of heart attack and stroke, DASH diet, complications of uncontrolled blood pressure  Reviewed medications with patient and discussed importance of compliance  Discussed plans with patient for ongoing care management follow up and provided patient with direct contact information for care management team  Advised patient, providing education and rationale, to monitor blood pressure daily and record, calling PCP for findings outside established parameters. The brother is checking his blood pressure daily and recording. This am his blood pressure was 107/82. Praised for accomplishment of checking blood pressures in the home setting.   Reviewed scheduled/upcoming provider appointments including: 10-19-2020. The patient was to see the cardiologist today but had to cancel appointment due to  having an accident and urinating on self. Reminded the patients brother [name] the importance of making a new appointment to see the cardiologist. Verbalized understanding.  Patient Goals/Self-Care Activities  Over the next 120 days, patient will:  - UNABLE to independently manage HTN Self administers medications as prescribed Attends all scheduled provider appointments Calls provider office for new concerns, questions, or BP outside discussed parameters Checks BP and records as discussed Follows a low sodium diet/DASH diet - blood pressure trends reviewed - depression screen reviewed - home or ambulatory blood pressure monitoring encouraged Follow Up Plan: Telephone follow up appointment with care management team member scheduled for: 09-08-2020 at 0945 am   Task: RNCM: Identify and Monitor Blood Pressure Elevation   Note:   Care Management Activities:    - blood pressure trends reviewed - depression screen reviewed - home or ambulatory blood pressure monitoring encouraged       Care Plan : RNCM: Diabetes Type 2 (Adult)  Updates made by Marlowe Sax since 07/28/2020 12:00 AM    Problem: RNCM: Glycemic Management (Diabetes, Type 2)   Priority: Medium    Goal: RNCM: Glycemic Management Optimized   Priority: Medium  Note:   Objective:  Lab Results  Component Value Date   HGBA1C 6.1 07/18/2020    Lab Results  Component Value Date   CREATININE 0.97 07/18/2020   CREATININE 1.20 07/13/2020   CREATININE 1.08 01/11/2020     No results found for: EGFR Current Barriers:   Knowledge Deficits related to basic Diabetes pathophysiology and self care/management  Knowledge Deficits related to medications used for management of diabetes  Literacy barriers  Cognitive Deficits  Limited Social Support  Unable to self administer medications as prescribed  Does not attend all scheduled provider appointments  Does not adhere to prescribed medication regimen  Lacks social  connections  Unable to perform IADLs independently  Does not maintain contact with provider office  Does not contact provider office for questions/concerns Case Manager Clinical Goal(s):   Collaboration with Aura Dials T, NP regarding development and update of comprehensive plan of care as evidenced by provider attestation and co-signature  Inter-disciplinary care team collaboration (see longitudinal plan of care)  Over the next 120 days, patient will demonstrate improved adherence to prescribed treatment plan for diabetes self care/management as evidenced by:   daily monitoring and recording of CBG   adherence to ADA/ carb modified diet  adherence to prescribed medication regimen Interventions:   Provided education to patient about basic DM disease process  Reviewed medications with patient and discussed importance of medication adherence  Discussed plans with patient for ongoing care  management follow up and provided patient with direct contact information for care management team  Provided patient with written educational materials related to hypo and hyperglycemia and importance of correct treatment  Reviewed scheduled/upcoming provider appointments including: 10-19-2020  Advised patient, providing education and rationale, to check cbg BID and record, calling pcp for findings outside established parameters.  Readings for 07-28-2020 was 97 and 07-27-2020 was 119  Review of patient status, including review of consultants reports, relevant laboratory and other test results, and medications completed. Patient Goals/Self-Care Activities  Over the next 120 days, patient will:  - UNABLE to independently manage DM Self administers oral medications as prescribed Self administers insulin as prescribed Attends all scheduled provider appointments Checks blood sugars as prescribed and utilize hyper and hypoglycemia protocol as needed Adheres to prescribed ADA/carb modified -  barriers to adherence to treatment plan identified - blood glucose monitoring encouraged - blood glucose readings reviewed - individualized medical nutrition therapy provided - mutual A1C goal set or reviewed - resources required to improve adherence to care identified - self-awareness of signs/symptoms of hypo or hyperglycemia encouraged - use of blood glucose monitoring log promoted Follow Up Plan: Telephone follow up appointment with care management team member scheduled for: 09-08-2020 at 0945 am   Task: RNCM: Alleviate Barriers to Glycemic Management   Note:   Care Management Activities:    - barriers to adherence to treatment plan identified - blood glucose monitoring encouraged - blood glucose readings reviewed - individualized medical nutrition therapy provided - mutual A1C goal set or reviewed - resources required to improve adherence to care identified - self-awareness of signs/symptoms of hypo or hyperglycemia encouraged - use of blood glucose monitoring log promoted         Plan:Telephone follow up appointment with care management team member scheduled for:  09-08-2020 at Jay am  Noreene Larsson RN, MSN, [facility] Mobile: [phone]
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
## 2020-07-31 ENCOUNTER — Encounter: Payer: Self-pay | Admitting: Cardiology

## 2020-08-08 ENCOUNTER — Telehealth: Payer: Self-pay | Admitting: Nurse Practitioner

## 2020-08-08 NOTE — Telephone Encounter (Signed)
Called to check the status of a home health order that was faxed on 01/18.  Please call to update at [phone], ext. [phone]

## 2020-08-09 NOTE — Telephone Encounter (Signed)
Called and spoke with [name]. Let her know that I do not see the order scanned into his chart. Asked for order to be re-faxed so we can sign these.

## 2020-08-10 NOTE — Telephone Encounter (Signed)
Forms received and placed in providers folder for signature.

## 2020-08-11 NOTE — Telephone Encounter (Signed)
Forms faxed back for the patient.

## 2020-08-21 ENCOUNTER — Other Ambulatory Visit: Payer: Self-pay

## 2020-08-21 ENCOUNTER — Ambulatory Visit (INDEPENDENT_AMBULATORY_CARE_PROVIDER_SITE_OTHER): Payer: Medicare Other | Admitting: Cardiology

## 2020-08-21 ENCOUNTER — Encounter: Payer: Self-pay | Admitting: Urology

## 2020-08-21 ENCOUNTER — Ambulatory Visit (INDEPENDENT_AMBULATORY_CARE_PROVIDER_SITE_OTHER): Payer: Medicare Other | Admitting: Urology

## 2020-08-21 ENCOUNTER — Encounter: Payer: Self-pay | Admitting: Cardiology

## 2020-08-21 VITALS — BP 137/83 | HR 72 | Ht 73.0 in | Wt 275.0 lb

## 2020-08-21 VITALS — BP 116/80 | HR 66 | Ht 73.0 in | Wt 275.0 lb

## 2020-08-21 DIAGNOSIS — E7800 Pure hypercholesterolemia, unspecified: Secondary | ICD-10-CM | POA: Diagnosis not present

## 2020-08-21 DIAGNOSIS — N3941 Urge incontinence: Secondary | ICD-10-CM | POA: Diagnosis not present

## 2020-08-21 DIAGNOSIS — I10 Essential (primary) hypertension: Secondary | ICD-10-CM

## 2020-08-21 DIAGNOSIS — R32 Unspecified urinary incontinence: Secondary | ICD-10-CM | POA: Diagnosis not present

## 2020-08-21 DIAGNOSIS — I639 Cerebral infarction, unspecified: Secondary | ICD-10-CM | POA: Diagnosis not present

## 2020-08-21 LAB — URINALYSIS, COMPLETE
Bilirubin, UA: NEGATIVE
Glucose, UA: NEGATIVE
Ketones, UA: NEGATIVE
Leukocytes,UA: NEGATIVE
Nitrite, UA: NEGATIVE
Protein,UA: NEGATIVE
Specific Gravity, UA: 1.015 (ref 1.005–1.030)
Urobilinogen, Ur: 4 mg/dL — ABNORMAL HIGH (ref 0.2–1.0)
pH, UA: 7 (ref 5.0–7.5)

## 2020-08-21 LAB — MICROSCOPIC EXAMINATION

## 2020-08-21 LAB — BLADDER SCAN AMB NON-IMAGING

## 2020-08-21 MED ORDER — TAMSULOSIN HCL 0.4 MG PO CAPS: 0.4000 mg | ORAL_CAPSULE | Freq: Every day | ORAL | 11 refills | Status: DC

## 2020-08-21 NOTE — Progress Notes (Signed)
Cardiology Office Note:    Date:  08/21/2020   ID:  [name], DOB [birth year], MRN [id number]  PCP:  Venita Lick, NP   [place]  Cardiologist:  Kate Sable, MD  Advanced Practice Provider:  No care team member to display Electrophysiologist:  None       Referring MD: Venita Lick, NP   Chief Complaint  Patient presents with  . New Patient (Initial Visit)    Ref by Marnee Guarneri for HTN associated with diabetes. Medications reviewed by the patient's pre packed medications. Patient c/o shortness of breath with little to no exertion.      History of Present Illness:    [name] is a 50 y.o. male with a hx of hypertension, hyperlipidemia, diabetes, CVA who presents due to history of CVA.  Patient had a stroke in 2015, has an ILR implant placed since 2015 after his stroke.  No evidence of atrial fibrillation or flutter noted.  His stroke cause right-sided weakness, resulting with him using a cane.  He denies chest pain or shortness of breath.  Denies ever smoking.  Takes all his medications as prescribed.  Presents today with his oldest brother.  Due to history of strokes, family wanted patient to get checked out and make sure his heart is okay.  Denies palpitations, presyncope or syncope.  Past Medical History:  Diagnosis Date  . Depression   . Diabetes mellitus without complication ([place])   . Hydrocephalus in adult [facility])   . Hyperlipidemia   . Hypertension   . Seizures ([place])   . Stroke [facility])     Past Surgical History:  Procedure Laterality Date  . COLONOSCOPY WITH PROPOFOL N/A 03/08/2020   Procedure: COLONOSCOPY WITH PROPOFOL;  Surgeon: Virgel Manifold, MD;  Location: [facility] ENDOSCOPY;  Service: Endoscopy;  Laterality: N/A;  . LOOP RECORDER IMPLANT  12/20/13   MDT LinQ implanted by Dr Lovena Le for cryptogenic stroke  . LOOP RECORDER IMPLANT N/A 12/20/2013   Procedure: LOOP RECORDER IMPLANT;  Surgeon: Evans Lance, MD;   Location: [facility] CATH LAB;  Service: Cardiovascular;  Laterality: N/A;    Current Medications: Current Meds  Medication Sig  . amLODipine (NORVASC) 10 MG tablet Take 1 tablet (10 mg total) by mouth daily.  . ASPIR-LOW 81 MG EC tablet TAKE 1 TABLET BY MOUTH ONCE DAILY  . atorvastatin (LIPITOR) 80 MG tablet TAKE 1 TABLET BY MOUTH ONCE DAILY AT 6PM  . Blood Glucose Monitoring Suppl ([place] FLEX SYSTEM) w/Device KIT Use to check blood sugar at least 2-3 times a day, in morning fasting and then 2 hours after a meal.  . Blood Pressure Monitoring (BLOOD PRESSURE MONITOR AUTOMAT) DEVI Use to monitor blood pressure once daily.  . carvedilol (COREG) 6.25 MG tablet TAKE 1 TABLET BY MOUTH TWICE DAILY WITH A MEAL  . citalopram (CELEXA) 10 MG tablet Take 1 tablet (10 mg total) by mouth daily.  . clopidogrel (PLAVIX) 75 MG tablet TAKE 1 TABLET BY MOUTH ONCE DAILY WITH BREAKFAST  . donepezil (ARICEPT) 5 MG tablet Take 1 tablet by mouth daily.  Marland Kitchen gabapentin (NEURONTIN) 300 MG capsule Take 1 capsule (300 mg total) by mouth 3 (three) times daily.  Marland Kitchen glucose blood (ONETOUCH VERIO) test strip Use to check blood sugar at least 2-3 times a day, in morning fasting and 2 hours after a meal.  . Lancet Devices (ACCU-CHEK SOFTCLIX) lancets Use as instructed  . lisinopril (ZESTRIL) 5 MG tablet  Take 1 tablet (5 mg total) by mouth daily.  . metFORMIN (GLUCOPHAGE-XR) 500 MG 24 hr tablet Take 2 tablets (1,000 mg total) by mouth in the morning and at bedtime.  . Na Sulfate-K Sulfate-Mg Sulf 17.5-3.13-1.6 GM/177ML SOLN At 5 PM the day before procedure take 1 bottle and 5 hours before procedure take 1 bottle.     Allergies:   Patient has no known allergies.   Social History   Socioeconomic History  . Marital status: Single    Spouse name: Not on file  . Number of children: Not on file  . Years of education: Not on file  . Highest education level: Not on file  Occupational History  . Not on file  Tobacco Use  .  Smoking status: Former Research scientist (life sciences)  . Smokeless tobacco: Never Used  . Tobacco comment: > 4 years quit  Vaping Use  . Vaping Use: Never used  Substance and Sexual Activity  . Alcohol use: Yes    Alcohol/week: 9.0 standard drinks    Types: 7 Cans of beer, 2 Standard drinks or equivalent per week    Comment: in a week  . Drug use: No  . Sexual activity: Not Currently  Other Topics Concern  . Not on file  Social History Narrative  . Not on file   Social Determinants of Health   Financial Resource Strain: Low Risk   . Difficulty of Paying Living Expenses: Not hard at all  Food Insecurity: No Food Insecurity  . Worried About Charity fundraiser in the Last Year: Never true  . Ran Out of Food in the Last Year: Never true  Transportation Needs: No Transportation Needs  . Lack of Transportation (Medical): No  . Lack of Transportation (Non-Medical): No  Physical Activity: Insufficiently Active  . Days of Exercise per Week: 2 days  . Minutes of Exercise per Session: 30 min  Stress: No Stress Concern Present  . Feeling of Stress : Not at all  Social Connections: Unknown  . Frequency of Communication with Friends and Family: More than three times a week  . Frequency of Social Gatherings with Friends and Family: More than three times a week  . Attends Religious Services: More than 4 times per year  . Active Member of Clubs or Organizations: Yes  . Attends Archivist Meetings: Never  . Marital Status: Not on file     Family History: The patient's family history includes Arthritis in his brother; Diabetes in his maternal grandfather and mother; Heart disease in his father; Hypertension in his father and mother; Stroke in his father.  ROS:   Please see the history of present illness.     All other systems reviewed and are negative.  EKGs/Labs/Other Studies Reviewed:    The following studies were reviewed today:   EKG:  EKG is  ordered today.  The ekg ordered today  demonstrates normal sinus rhythm, nonspecific T wave changes.  Recent Labs: 07/13/2020: Hemoglobin 13.6; Platelets 205 07/18/2020: ALT 18; BUN 9; Creatinine, Ser 0.97; Potassium 3.9; Sodium 142; TSH 0.793  Recent Lipid Panel    Component Value Date/Time   CHOL 117 07/18/2020 1028   CHOL 123 12/02/2018 1323   CHOL 205 (H) 12/15/2013 0408   TRIG 86 07/18/2020 1028   TRIG 73 12/02/2018 1323   TRIG 154 12/15/2013 0408   HDL 46 07/18/2020 1028   HDL 49 12/15/2013 0408   CHOLHDL 2.5 10/11/2015 1027   VLDL 15 12/02/2018 1323  VLDL 31 12/15/2013 0408   LDLCALC 54 07/18/2020 1028   LDLCALC 125 (H) 12/15/2013 0408     Risk Assessment/Calculations:      Physical Exam:    VS:  BP 116/80 (BP Location: Right Arm, Patient Position: Sitting, Cuff Size: Large)   Pulse 66   Ht 6' 1" (1.854 m)   Wt 275 lb (124.7 kg)   SpO2 97%   BMI 36.28 kg/m     Wt Readings from Last 3 Encounters:  08/21/20 275 lb (124.7 kg)  08/21/20 275 lb (124.7 kg)  07/18/20 274 lb (124.3 kg)     GEN:  Well nourished, well developed in no acute distress HEENT: Normal NECK: No JVD; No carotid bruits LYMPHATICS: No lymphadenopathy CARDIAC: RRR, no murmurs, rubs, gallops RESPIRATORY:  Clear to auscultation without rales, wheezing or rhonchi  ABDOMEN: Soft, non-tender, non-distended MUSCULOSKELETAL:  No edema; right extremity weakness noted SKIN: Warm and dry NEUROLOGIC:  Alert and oriented x 3 PSYCHIATRIC:  Normal affect   ASSESSMENT:    1. Cerebrovascular accident (CVA), unspecified mechanism ([place])   2. Primary hypertension   3. Pure hypercholesterolemia    PLAN:    In order of problems listed above:  1. History of CVA, on aspirin, Plavix, statin.  Follows up with neurology.  ILR implant has not shown any A. fib or flutter.  Get echocardiogram.  Denies any symptoms of chest pain or shortness of breath.  No indication for stress testing at this point.  Walks with a walker. 2. Hypertension, BP  controlled.  Continue current BP meds. 3. Hyperlipidemia, continue Lipitor.  Follow-up after echocardiogram.    Medication Adjustments/Labs and Tests Ordered: Current medicines are reviewed at length with the patient today.  Concerns regarding medicines are outlined above.  Orders Placed This Encounter  Procedures  . EKG 12-Lead  . ECHOCARDIOGRAM COMPLETE   No orders of the defined types were placed in this encounter.   Patient Instructions  Medication Instructions:   1. Your physician recommends that you continue on your current medications as directed. Please refer to the Current Medication list given to you today.  *If you need a refill on your cardiac medications before your next appointment, please call your pharmacy*   Lab Work:  1. None ordered  If you have labs (blood work) drawn today and your tests are completely normal, you will receive your results only by: Marland Kitchen MyChart Message (if you have MyChart) OR . A paper copy in the mail If you have any lab test that is abnormal or we need to change your treatment, we will call you to review the results.   Testing/Procedures:  1. Echocardiogram Please return to [facility] on ______________ at _______________ AM/PM for an Echocardiogram. Your physician has requested that you have an echocardiogram. Echocardiography is a painless test that uses sound waves to create images of your heart. It provides your doctor with information about the size and shape of your heart and how well your heart's chambers and valves are working. This procedure takes approximately one hour. There are no restrictions for this procedure. Please note; depending on visual quality an IV may need to be placed.     Follow-Up: At [facility], you and your health needs are our priority.  As part of our continuing mission to provide you with exceptional heart care, we have created designated Provider Care Teams.  These Care Teams include  your primary Cardiologist (physician) and Advanced Practice Providers (APPs -  Physician  Assistants and Nurse Practitioners) who all work together to provide you with the care you need, when you need it.  We recommend signing up for the patient portal called "MyChart".  Sign up information is provided on this After Visit Summary.  MyChart is used to connect with patients for Virtual Visits (Telemedicine).  Patients are able to view lab/test results, encounter notes, upcoming appointments, etc.  Non-urgent messages can be sent to your provider as well.   To learn more about what you can do with MyChart, go to [email or web address].    Your next appointment:   After ECHO   The format for your next appointment:   In Person  Provider:   You may see Agbor-Et[place] or one of the following Advanced Practice Providers on your designated Care Team:    Murray Hodgkins, NP  Christell Faith, PA-C  Marrianne Mood, PA-C  Cadence [place], [place]  Laurann Montana, NP        Signed, Kate Sable, MD  08/21/2020 1:08 PM    [place]

## 2020-08-21 NOTE — Patient Instructions (Signed)
Medication Instructions:   1. Your physician recommends that you continue on your current medications as directed. Please refer to the Current Medication list given to you today.  *If you need a refill on your cardiac medications before your next appointment, please call your pharmacy*   Lab Work:  1. None ordered  If you have labs (blood work) drawn today and your tests are completely normal, you will receive your results only by: Marland Kitchen MyChart Message (if you have MyChart) OR . A paper copy in the mail If you have any lab test that is abnormal or we need to change your treatment, we will call you to review the results.   Testing/Procedures:  1. Echocardiogram Please return to [facility] on ______________ at _______________ AM/PM for an Echocardiogram. Your physician has requested that you have an echocardiogram. Echocardiography is a painless test that uses sound waves to create images of your heart. It provides your doctor with information about the size and shape of your heart and how well your heart's chambers and valves are working. This procedure takes approximately one hour. There are no restrictions for this procedure. Please note; depending on visual quality an IV may need to be placed.     Follow-Up: At [facility], you and your health needs are our priority.  As part of our continuing mission to provide you with exceptional heart care, we have created designated Provider Care Teams.  These Care Teams include your primary Cardiologist (physician) and Advanced Practice Providers (APPs -  Physician Assistants and Nurse Practitioners) who all work together to provide you with the care you need, when you need it.  We recommend signing up for the patient portal called "MyChart".  Sign up information is provided on this After Visit Summary.  MyChart is used to connect with patients for Virtual Visits (Telemedicine).  Patients are able to view lab/test results, encounter  notes, upcoming appointments, etc.  Non-urgent messages can be sent to your provider as well.   To learn more about what you can do with MyChart, go to [email or web address].    Your next appointment:   After ECHO   The format for your next appointment:   In Person  Provider:   You may see Agbor-Etang or one of the following Advanced Practice Providers on your designated Care Team:    Nicolasa Ducking, NP  Eula Listen, PA-C  Marisue Ivan, PA-C  Cadence [place], [place]  Gillian Shields, NP

## 2020-08-21 NOTE — Progress Notes (Signed)
 08/21/2020 9:23 AM   [name] 01/31/1971 [id number]  Referring provider: Potter, Zachary E, MD [address] [facility] [place],  [place] [address]  Chief Complaint  Patient presents with  . Urinary Incontinence    HPI: I was consulted to assist the patient's urgency incontinence over the last 2 years.  He has had a stroke and utilizes a cane and walks slowly.  No stress incontinence or bedwetting.  He wears 2 pads a day that can be damp or soaked.  Patient said his flow was reasonable and he feels empty  He is on oral hypoglycemics.  No history of bladder infections kidney stones or bladder surgery   PMH: Past Medical History:  Diagnosis Date  . Depression   . Diabetes mellitus without complication (HCC)   . Hydrocephalus in adult (HCC)   . Hyperlipidemia   . Hypertension   . Seizures (HCC)   . Stroke (HCC)     Surgical History: Past Surgical History:  Procedure Laterality Date  . COLONOSCOPY WITH PROPOFOL N/A 03/08/2020   Procedure: COLONOSCOPY WITH PROPOFOL;  Surgeon: Tahiliani, Varnita B, MD;  Location: [facility] ENDOSCOPY;  Service: Endoscopy;  Laterality: N/A;  . LOOP RECORDER IMPLANT  12/20/13   MDT LinQ implanted by Dr Taylor for cryptogenic stroke  . LOOP RECORDER IMPLANT N/A 12/20/2013   Procedure: LOOP RECORDER IMPLANT;  Surgeon: Gregg W Taylor, MD;  Location: [facility] CATH LAB;  Service: Cardiovascular;  Laterality: N/A;    Home Medications:  Allergies as of 08/21/2020   No Known Allergies     Medication List       Accurate as of August 21, 2020  9:23 AM. If you have any questions, ask your nurse or doctor.        accu-chek softclix lancets Use as instructed   amLODipine 10 MG tablet Commonly known as: NORVASC Take 1 tablet (10 mg total) by mouth daily.   Aspir-Low 81 MG EC tablet Generic drug: aspirin TAKE 1 TABLET BY MOUTH ONCE DAILY   atorvastatin 80 MG tablet Commonly known as: LIPITOR TAKE 1 TABLET BY MOUTH  ONCE DAILY AT 6PM   Blood Pressure Monitor Automat Devi Use to monitor blood pressure once daily.   carvedilol 6.25 MG tablet Commonly known as: COREG TAKE 1 TABLET BY MOUTH TWICE DAILY WITH A MEAL   citalopram 10 MG tablet Commonly known as: CELEXA Take 1 tablet (10 mg total) by mouth daily.   clopidogrel 75 MG tablet Commonly known as: PLAVIX TAKE 1 TABLET BY MOUTH ONCE DAILY WITH BREAKFAST   donepezil 5 MG tablet Commonly known as: ARICEPT Take 1 tablet by mouth daily.   gabapentin 300 MG capsule Commonly known as: NEURONTIN Take 1 capsule (300 mg total) by mouth 3 (three) times daily.   lisinopril 5 MG tablet Commonly known as: ZESTRIL Take 1 tablet (5 mg total) by mouth daily.   metFORMIN 500 MG 24 hr tablet Commonly known as: GLUCOPHAGE-XR Take 2 tablets (1,000 mg total) by mouth in the morning and at bedtime.   Na Sulfate-K Sulfate-Mg Sulf 17.5-3.13-1.6 GM/177ML Soln At 5 PM the day before procedure take 1 bottle and 5 hours before procedure take 1 bottle.   OneTouch Verio Flex System w/Device Kit Use to check blood sugar at least 2-3 times a day, in morning fasting and then 2 hours after a meal.   OneTouch Verio test strip Generic drug: glucose blood Use to check blood sugar at least 2-3 times a day,   in morning fasting and 2 hours after a meal.   vitamin B-12 1000 MCG tablet Commonly known as: CYANOCOBALAMIN Take 1 tablet (1,000 mcg total) by mouth daily.       Allergies: No Known Allergies  Family History: Family History  Problem Relation Age of Onset  . Stroke Father   . Heart disease Father   . Hypertension Father   . Hypertension Mother   . Diabetes Mother   . Arthritis Brother   . Diabetes Maternal Grandfather     Social History:  reports that he has quit smoking. He has never used smokeless tobacco. He reports current alcohol use of about 9.0 standard drinks of alcohol per week. He reports that he does not use drugs.  ROS:                                         Physical Exam: There were no vitals taken for this visit.  Constitutional:  Alert and oriented, No acute distress. HEENT: [place] AT, moist mucus membranes.  Trachea midline, no masses. Cardiovascular: No clubbing, cyanosis, or edema. Respiratory: Normal respiratory effort, no increased work of breathing. GI: Abdomen is soft, nontender, nondistended, no abdominal masses GU: No CVA tenderness.  Mobility.  Difficult to examine but benign prostate.  Male genitalia normal Skin: No rashes, bruises or suspicious lesions. Lymph: No cervical or inguinal adenopathy. Neurologic: Grossly intact, no focal deficits, moving all 4 extremities. Psychiatric: Normal mood and affect.  Laboratory Data: Lab Results  Component Value Date   WBC 4.2 07/13/2020   HGB 13.6 07/13/2020   HCT 41.4 07/13/2020   MCV 94.1 07/13/2020   PLT 205 07/13/2020    Lab Results  Component Value Date   CREATININE 0.97 07/18/2020    No results found for: PSA  No results found for: TESTOSTERONE  Lab Results  Component Value Date   HGBA1C 6.1 07/18/2020    Urinalysis    Component Value Date/Time   COLORURINE YELLOW (A) 07/13/2020 1313   APPEARANCEUR CLEAR (A) 07/13/2020 1313   APPEARANCEUR Clear 12/14/2013 0959   LABSPEC 1.018 07/13/2020 1313   LABSPEC 1.010 12/14/2013 0959   PHURINE 7.0 07/13/2020 1313   GLUCOSEU NEGATIVE 07/13/2020 1313   GLUCOSEU Negative 12/14/2013 0959   HGBUR NEGATIVE 07/13/2020 1313   BILIRUBINUR NEGATIVE 07/13/2020 1313   BILIRUBINUR Negative 12/14/2013 [place] 07/13/2020 1313   PROTEINUR 100 (A) 07/13/2020 1313   UROBILINOGEN 1.0 12/22/2013 1648   NITRITE NEGATIVE 07/13/2020 1313   LEUKOCYTESUR NEGATIVE 07/13/2020 1313   LEUKOCYTESUR Negative 12/14/2013 0959    Pertinent Imaging: Urine negative but sent for culture.  Chart reviewed.  Residual 115 mL  Assessment & Plan: Reassess in 6 weeks with a residual on  Flomax.  Patient aware there is many ways of helping him.  Mild elevated residual may be false positive.  1. Urinary incontinence, unspecified type  - Urinalysis, Complete - Bladder Scan (Post Void Residual) in office   No follow-ups on file.  Reece Packer, MD  [place] [address] [place], [place] [address] [phone]

## 2020-08-24 LAB — CULTURE, URINE COMPREHENSIVE

## 2020-08-29 ENCOUNTER — Telehealth: Payer: Self-pay

## 2020-08-29 NOTE — Telephone Encounter (Signed)
Would need visit for documentation for this for Medicare -- would need face to face visit.

## 2020-08-29 NOTE — Telephone Encounter (Unsigned)
Copied from CRM [id number]. Topic: General - Other >> Aug 29, 2020  4:23 PM [name] wrote: Reason for CRM: [name] called asking if [name] could get an order for a power wheelchair.    If [name] could write an order he will come by the office and pick it up and take it to [name]   CB#  [phone]

## 2020-08-30 NOTE — Telephone Encounter (Signed)
Scheduled 3/2

## 2020-09-05 ENCOUNTER — Other Ambulatory Visit: Payer: Self-pay

## 2020-09-05 ENCOUNTER — Ambulatory Visit (INDEPENDENT_AMBULATORY_CARE_PROVIDER_SITE_OTHER): Payer: Medicare Other

## 2020-09-05 DIAGNOSIS — I6389 Other cerebral infarction: Secondary | ICD-10-CM

## 2020-09-05 DIAGNOSIS — I639 Cerebral infarction, unspecified: Secondary | ICD-10-CM

## 2020-09-05 DIAGNOSIS — R0602 Shortness of breath: Secondary | ICD-10-CM

## 2020-09-05 LAB — ECHOCARDIOGRAM COMPLETE
AR max vel: 2.95 cm2
AV Area VTI: 2.24 cm2
AV Area mean vel: 2.24 cm2
AV Mean grad: 3 mmHg
AV Peak grad: 4.9 mmHg
Ao pk vel: 1.11 m/s
Area-P 1/2: 4.01 cm2
S' Lateral: 3.9 cm

## 2020-09-05 MED ORDER — PERFLUTREN LIPID MICROSPHERE
1.0000 mL | INTRAVENOUS | Status: AC | PRN
  Administered 2020-09-05: 2 mL via INTRAVENOUS

## 2020-09-06 ENCOUNTER — Ambulatory Visit (INDEPENDENT_AMBULATORY_CARE_PROVIDER_SITE_OTHER): Payer: Medicare Other | Admitting: Nurse Practitioner

## 2020-09-06 ENCOUNTER — Encounter: Payer: Self-pay | Admitting: Nurse Practitioner

## 2020-09-06 DIAGNOSIS — I639 Cerebral infarction, unspecified: Secondary | ICD-10-CM

## 2020-09-06 DIAGNOSIS — Z7409 Other reduced mobility: Secondary | ICD-10-CM

## 2020-09-06 NOTE — Patient Instructions (Signed)
Diabetes Mellitus and Nutrition, Adult When you have diabetes, or diabetes mellitus, it is very important to have healthy eating habits because your blood sugar (glucose) levels are greatly affected by what you eat and drink. Eating healthy foods in the right amounts, at about the same times every day, can help you:  Control your blood glucose.  Lower your risk of heart disease.  Improve your blood pressure.  Reach or maintain a healthy weight. What can affect my meal plan? Every person with diabetes is different, and each person has different needs for a meal plan. Your health care provider may recommend that you work with a dietitian to make a meal plan that is best for you. Your meal plan may vary depending on factors such as:  The calories you need.  The medicines you take.  Your weight.  Your blood glucose, blood pressure, and cholesterol levels.  Your activity level.  Other health conditions you have, such as heart or kidney disease. How do carbohydrates affect me? Carbohydrates, also called carbs, affect your blood glucose level more than any other type of food. Eating carbs naturally raises the amount of glucose in your blood. Carb counting is a method for keeping track of how many carbs you eat. Counting carbs is important to keep your blood glucose at a healthy level, especially if you use insulin or take certain oral diabetes medicines. It is important to know how many carbs you can safely have in each meal. This is different for every person. Your dietitian can help you calculate how many carbs you should have at each meal and for each snack. How does alcohol affect me? Alcohol can cause a sudden decrease in blood glucose (hypoglycemia), especially if you use insulin or take certain oral diabetes medicines. Hypoglycemia can be a life-threatening condition. Symptoms of hypoglycemia, such as sleepiness, dizziness, and confusion, are similar to symptoms of having too much  alcohol.  Do not drink alcohol if: ? Your health care provider tells you not to drink. ? You are pregnant, may be pregnant, or are planning to become pregnant.  If you drink alcohol: ? Do not drink on an empty stomach. ? Limit how much you use to:  0-1 drink a day for women.  0-2 drinks a day for men. ? Be aware of how much alcohol is in your drink. In the [place] one drink equals one 12 oz bottle of beer (355 mL), one 5 oz glass of wine (148 mL), or one 1 oz glass of hard liquor (44 mL). ? Keep yourself hydrated with water, diet soda, or unsweetened iced tea.  Keep in mind that regular soda, juice, and other mixers may contain a lot of sugar and must be counted as carbs. What are tips for following this plan? Reading food labels  Start by checking the serving size on the "Nutrition Facts" label of packaged foods and drinks. The amount of calories, carbs, fats, and other nutrients listed on the label is based on one serving of the item. Many items contain more than one serving per package.  Check the total grams (g) of carbs in one serving. You can calculate the number of servings of carbs in one serving by dividing the total carbs by 15. For example, if a food has 30 g of total carbs per serving, it would be equal to 2 servings of carbs.  Check the number of grams (g) of saturated fats and trans fats in one serving. Choose foods that have   a low amount or none of these fats.  Check the number of milligrams (mg) of salt (sodium) in one serving. Most people should limit total sodium intake to less than 2,300 mg per day.  Always check the nutrition information of foods labeled as "low-fat" or "nonfat." These foods may be higher in added sugar or refined carbs and should be avoided.  Talk to your dietitian to identify your daily goals for nutrients listed on the label. Shopping  Avoid buying canned, pre-made, or processed foods. These foods tend to be high in fat, sodium, and added  sugar.  Shop around the outside edge of the grocery store. This is where you will most often find fresh fruits and vegetables, bulk grains, fresh meats, and fresh dairy. Cooking  Use low-heat cooking methods, such as baking, instead of high-heat cooking methods like deep frying.  Cook using healthy oils, such as olive, canola, or sunflower oil.  Avoid cooking with butter, cream, or high-fat meats. Meal planning  Eat meals and snacks regularly, preferably at the same times every day. Avoid going long periods of time without eating.  Eat foods that are high in fiber, such as fresh fruits, vegetables, beans, and whole grains. Talk with your dietitian about how many servings of carbs you can eat at each meal.  Eat 4-6 oz (112-168 g) of lean protein each day, such as lean meat, chicken, fish, eggs, or tofu. One ounce (oz) of lean protein is equal to: ? 1 oz (28 g) of meat, chicken, or fish. ? 1 egg. ?  cup (62 g) of tofu.  Eat some foods each day that contain healthy fats, such as avocado, nuts, seeds, and fish.   What foods should I eat? Fruits Berries. Apples. Oranges. Peaches. Apricots. Plums. Grapes. Mango. Papaya. Pomegranate. Kiwi. Cherries. Vegetables Lettuce. Spinach. Leafy greens, including kale, chard, collard greens, and mustard greens. Beets. Cauliflower. Cabbage. Broccoli. Carrots. Green beans. Tomatoes. Peppers. Onions. Cucumbers. Brussels sprouts. Grains Whole grains, such as whole-wheat or whole-grain bread, crackers, tortillas, cereal, and pasta. Unsweetened oatmeal. Quinoa. Brown or wild rice. Meats and other proteins Seafood. Poultry without skin. Lean cuts of poultry and beef. Tofu. Nuts. Seeds. Dairy Low-fat or fat-free dairy products such as milk, yogurt, and cheese. The items listed above may not be a complete list of foods and beverages you can eat. Contact a dietitian for more information. What foods should I avoid? Fruits Fruits canned with  syrup. Vegetables Canned vegetables. Frozen vegetables with butter or cream sauce. Grains Refined white flour and flour products such as bread, pasta, snack foods, and cereals. Avoid all processed foods. Meats and other proteins Fatty cuts of meat. Poultry with skin. Breaded or fried meats. Processed meat. Avoid saturated fats. Dairy Full-fat yogurt, cheese, or milk. Beverages Sweetened drinks, such as soda or iced tea. The items listed above may not be a complete list of foods and beverages you should avoid. Contact a dietitian for more information. Questions to ask a health care provider  Do I need to meet with a diabetes educator?  Do I need to meet with a dietitian?  What number can I call if I have questions?  When are the best times to check my blood glucose? Where to find more information:  American Diabetes Association: diabetes.org  Academy of Nutrition and Dietetics: www.eatright.org  National Institute of Diabetes and Digestive and Kidney Diseases: www.niddk.nih.gov  Association of Diabetes Care and Education Specialists: www.diabeteseducator.org Summary  It is important to have healthy eating   habits because your blood sugar (glucose) levels are greatly affected by what you eat and drink.  A healthy meal plan will help you control your blood glucose and maintain a healthy lifestyle.  Your health care provider may recommend that you work with a dietitian to make a meal plan that is best for you.  Keep in mind that carbohydrates (carbs) and alcohol have immediate effects on your blood glucose levels. It is important to count carbs and to use alcohol carefully. This information is not intended to replace advice given to you by your health care provider. Make sure you discuss any questions you have with your health care provider. Document Revised: 06/01/2019 Document Reviewed: 06/01/2019 Elsevier Patient Education  2021 Elsevier Inc.  

## 2020-09-06 NOTE — Assessment & Plan Note (Addendum)
Due to history of CVA and decreased lower extremity strength.  Will work on obtaining a power chair for patient, this would offer benefit for ADLs and ability to attend provider visits.  Order to be sent to [facility] per family and patient request.

## 2020-09-06 NOTE — Progress Notes (Addendum)
BP 126/81   Pulse 69   Temp 98.2 F (36.8 C) (Oral)   Ht 6\' 1"  (1.854 m)   Wt 280 lb (127 kg)   SpO2 98%   BMI 36.94 kg/m    Subjective:    Patient ID: , male    DOB: [birth year], 50 y.o.   MRN: [id number]  HPI: [name] is a 50 y.o. male  Chief Complaint  Patient presents with  . Discuss Powerwheel Chair    Patient is here with paper work to have completed for him to possibly get a power wheel chair.   Here today with his caregiver/brother to work on obtaining a scooter/pov.  Would like order sent to [facility].    MOBILITY ISSUES: History of CVA 7-8 years ago.  At this time is dependent on manual wheelchair to get to appointments and perform ADLs, but has difficulty with this as uncomfortable on upper body and difficulty maneuvering in this -- in past has tried alternate methods like cane and walker, but unsuccessful with both of these.  In past has had a scooter which offered benefit.  Has been unable to ambulate since his stroke due to decreased function bilateral lower extremities.  Patient can transfer independently from chair to bed and vice versa.  A manual w/c would be difficult for him to utilize as well, as he enjoys being active to his highest level and this would decrease his current independence + left arm is stronger then right.  Would benefit from power chair to assist with ADLs.  He has control of upper body and is able to maneuver a power chair + cognitively understands how to safely use the power chair.  Previously had scooter after stroke, but has not had one in years now which has made ADLs difficult and attendance to appointments, missing some appointments due to lack of ability to maneuver and attend due to manual wheelchair only at this time.  Without a power chair his MRADLs would be significantly affected, as he would be unable to transfer to areas in his home to cook and bath + would not be able to attend provider appointments to  assess his overall health, as a diabetic attending appointments every 3 months is pertinent.  A power chair would greatly enhance his ability to attend appointments and have some independence with ADLs, including eating, bathing, feeding, toileting.  He is mentally capable of using a power chair.  Relevant past medical, surgical, family and social history reviewed and updated as indicated. Interim medical history since our last visit reviewed. Allergies and medications reviewed and updated.  Review of Systems  Constitutional: Negative for activity change, diaphoresis, fatigue and fever.  Respiratory: Negative for cough, chest tightness, shortness of breath and wheezing.   Cardiovascular: Negative for chest pain, palpitations and leg swelling.  Gastrointestinal: Negative.   Neurological: Negative.   Psychiatric/Behavioral: Negative.     Per HPI unless specifically indicated above     Objective:    BP 126/81   Pulse 69   Temp 98.2 F (36.8 C) (Oral)   Ht 6\' 1"  (1.854 m)   Wt 280 lb (127 kg)   SpO2 98%   BMI 36.94 kg/m   Wt Readings from Last 3 Encounters:  09/07/20 269 lb 2 oz (122.1 kg)  09/06/20 280 lb (127 kg)  08/21/20 275 lb (124.7 kg)    Physical Exam Vitals and nursing note reviewed.  Constitutional:      General:  He is awake. He is not in acute distress.    Appearance: He is well-developed. He is obese. He is not ill-appearing.  HENT:     Head: Normocephalic and atraumatic.     Right Ear: Hearing normal. No drainage.     Left Ear: Hearing normal. No drainage.  Eyes:     General: Lids are normal.        Right eye: No discharge.        Left eye: No discharge.     Conjunctiva/sclera: Conjunctivae normal.     Pupils: Pupils are equal, round, and reactive to light.  Neck:     Thyroid: No thyromegaly.     Vascular: No carotid bruit.  Cardiovascular:     Rate and Rhythm: Normal rate and regular rhythm.     Heart sounds: Normal heart sounds, S1 normal and S2 normal.  No murmur heard. No gallop.   Pulmonary:     Effort: Pulmonary effort is normal. No accessory muscle usage or respiratory distress.     Breath sounds: Normal breath sounds.  Abdominal:     General: Bowel sounds are normal.     Palpations: Abdomen is soft.  Musculoskeletal:        General: Normal range of motion.     Right upper arm: Normal.     Left upper arm: Normal.     Right hand: Normal.     Left hand: Normal.     Cervical back: Normal range of motion and neck supple.     Right lower leg: No edema.     Left lower leg: No edema.     Comments: In wheelchair at this time, wheelchair from office.  Strength LUE 5/5 and RUE 4/5.  Strength bilateral lower extremities is 3/5, decreased, able to transfer from W/C to chair in office room with pivot safely.  Skin:    General: Skin is warm and dry.     Capillary Refill: Capillary refill takes less than 2 seconds.  Neurological:     Mental Status: He is alert and oriented to person, place, and time.  Psychiatric:        Attention and Perception: Attention normal.        Mood and Affect: Mood normal.        Speech: Speech normal.        Behavior: Behavior normal. Behavior is cooperative.    Results for orders placed or performed in visit on 09/05/20  ECHOCARDIOGRAM COMPLETE  Result Value Ref Range   AR max vel 2.95 cm2   AV Peak grad 4.9 mmHg   Ao pk vel 1.11 m/s   S' Lateral 3.90 cm   Area-P 1/2 4.01 cm2   AV Area VTI 2.24 cm2   AV Mean grad 3.0 mmHg   AV Area mean vel 2.24 cm2      Assessment & Plan:   Problem List Items Addressed This Visit      Other   Poor mobility    Due to history of CVA and decreased lower extremity strength.  Will work on obtaining a power chair for patient, this would offer benefit for ADLs and ability to attend provider visits.  Order to be sent to [facility] per family and patient request.            Follow up plan: Return if symptoms worsen or fail to improve.

## 2020-09-07 ENCOUNTER — Telehealth: Payer: Self-pay

## 2020-09-07 ENCOUNTER — Encounter: Payer: Self-pay | Admitting: Cardiology

## 2020-09-07 ENCOUNTER — Ambulatory Visit (INDEPENDENT_AMBULATORY_CARE_PROVIDER_SITE_OTHER): Payer: Medicare Other | Admitting: Cardiology

## 2020-09-07 ENCOUNTER — Other Ambulatory Visit: Payer: Self-pay

## 2020-09-07 VITALS — BP 130/80 | HR 80 | Ht 73.0 in | Wt 269.1 lb

## 2020-09-07 DIAGNOSIS — I10 Essential (primary) hypertension: Secondary | ICD-10-CM | POA: Diagnosis not present

## 2020-09-07 DIAGNOSIS — I639 Cerebral infarction, unspecified: Secondary | ICD-10-CM

## 2020-09-07 DIAGNOSIS — E7800 Pure hypercholesterolemia, unspecified: Secondary | ICD-10-CM | POA: Diagnosis not present

## 2020-09-07 NOTE — Progress Notes (Signed)
Cardiology Office Note:    Date:  09/07/2020   ID:  [name], DOB 12/18/1970, MRN [id number]  PCP:  Venita Lick, NP   [place]  Cardiologist:  Kate Sable, MD  Advanced Practice Provider:  No care team member to display Electrophysiologist:  None       Referring MD: Venita Lick, NP   Chief Complaint  Patient presents with  . Other    Post echo no complaints today. Meds reviewed verbally with pt.     History of Present Illness:    [name] is a 50 y.o. male with a hx of hypertension, hyperlipidemia, diabetes, CVA who presents due to history of CVA.  Implantable loop recorder placed since 2015 after his stroke has not shown any arrhythmias such as A. fib or flutter.  Echocardiogram was ordered after last visit.  He currently uses a cane to help with ambulation.  Has no concerns at this time.  Takes all his blood pressure medications as prescribed.  Family/brother states his strength is getting better with physical therapy although patient sometimes declines performing exercises.  Past Medical History:  Diagnosis Date  . Depression   . Diabetes mellitus without complication ([place])   . Hydrocephalus in adult [facility])   . Hyperlipidemia   . Hypertension   . Seizures ([place])   . Stroke [facility])     Past Surgical History:  Procedure Laterality Date  . COLONOSCOPY WITH PROPOFOL N/A 03/08/2020   Procedure: COLONOSCOPY WITH PROPOFOL;  Surgeon: Virgel Manifold, MD;  Location: [facility] ENDOSCOPY;  Service: Endoscopy;  Laterality: N/A;  . LOOP RECORDER IMPLANT  12/20/13   MDT LinQ implanted by Dr Lovena Le for cryptogenic stroke  . LOOP RECORDER IMPLANT N/A 12/20/2013   Procedure: LOOP RECORDER IMPLANT;  Surgeon: Evans Lance, MD;  Location: [facility] CATH LAB;  Service: Cardiovascular;  Laterality: N/A;    Current Medications: Current Meds  Medication Sig  . amLODipine (NORVASC) 10 MG tablet Take 1 tablet (10 mg total) by mouth daily.  .  ASPIR-LOW 81 MG EC tablet TAKE 1 TABLET BY MOUTH ONCE DAILY  . atorvastatin (LIPITOR) 80 MG tablet TAKE 1 TABLET BY MOUTH ONCE DAILY AT 6PM  . Blood Glucose Monitoring Suppl ([place] FLEX SYSTEM) w/Device KIT Use to check blood sugar at least 2-3 times a day, in morning fasting and then 2 hours after a meal.  . Blood Pressure Monitoring (BLOOD PRESSURE MONITOR AUTOMAT) DEVI Use to monitor blood pressure once daily.  . carvedilol (COREG) 6.25 MG tablet TAKE 1 TABLET BY MOUTH TWICE DAILY WITH A MEAL  . citalopram (CELEXA) 10 MG tablet Take 1 tablet (10 mg total) by mouth daily.  . clopidogrel (PLAVIX) 75 MG tablet TAKE 1 TABLET BY MOUTH ONCE DAILY WITH BREAKFAST  . donepezil (ARICEPT) 5 MG tablet Take 1 tablet by mouth daily.  Marland Kitchen gabapentin (NEURONTIN) 300 MG capsule Take 1 capsule (300 mg total) by mouth 3 (three) times daily.  Marland Kitchen glucose blood (ONETOUCH VERIO) test strip Use to check blood sugar at least 2-3 times a day, in morning fasting and 2 hours after a meal.  . Lancet Devices (ACCU-CHEK SOFTCLIX) lancets Use as instructed  . lisinopril (ZESTRIL) 5 MG tablet Take 1 tablet (5 mg total) by mouth daily.  . metFORMIN (GLUCOPHAGE-XR) 500 MG 24 hr tablet Take 2 tablets (1,000 mg total) by mouth in the morning and at bedtime.  . Na Sulfate-K Sulfate-Mg Sulf 17.5-3.13-1.6 GM/177ML SOLN  At 5 PM the day before procedure take 1 bottle and 5 hours before procedure take 1 bottle.  . tamsulosin (FLOMAX) 0.4 MG CAPS capsule Take 0.4 mg by mouth daily.     Allergies:   Patient has no known allergies.   Social History   Socioeconomic History  . Marital status: Single    Spouse name: Not on file  . Number of children: Not on file  . Years of education: Not on file  . Highest education level: Not on file  Occupational History  . Not on file  Tobacco Use  . Smoking status: Former Research scientist (life sciences)  . Smokeless tobacco: Never Used  . Tobacco comment: > 4 years quit  Vaping Use  . Vaping Use: Never used   Substance and Sexual Activity  . Alcohol use: Yes    Alcohol/week: 9.0 standard drinks    Types: 7 Cans of beer, 2 Standard drinks or equivalent per week    Comment: in a week  . Drug use: No  . Sexual activity: Not Currently  Other Topics Concern  . Not on file  Social History Narrative  . Not on file   Social Determinants of Health   Financial Resource Strain: Low Risk   . Difficulty of Paying Living Expenses: Not hard at all  Food Insecurity: No Food Insecurity  . Worried About Charity fundraiser in the Last Year: Never true  . Ran Out of Food in the Last Year: Never true  Transportation Needs: No Transportation Needs  . Lack of Transportation (Medical): No  . Lack of Transportation (Non-Medical): No  Physical Activity: Insufficiently Active  . Days of Exercise per Week: 2 days  . Minutes of Exercise per Session: 30 min  Stress: No Stress Concern Present  . Feeling of Stress : Not at all  Social Connections: Not on file     Family History: The patient's family history includes Arthritis in his brother; Diabetes in his maternal grandfather and mother; Heart disease in his father; Hypertension in his father and mother; Stroke in his father.  ROS:   Please see the history of present illness.     All other systems reviewed and are negative.  EKGs/Labs/Other Studies Reviewed:    The following studies were reviewed today:   EKG:  EKG not  ordered today.    Recent Labs: 07/13/2020: Hemoglobin 13.6; Platelets 205 07/18/2020: ALT 18; BUN 9; Creatinine, Ser 0.97; Potassium 3.9; Sodium 142; TSH 0.793  Recent Lipid Panel    Component Value Date/Time   CHOL 117 07/18/2020 1028   CHOL 123 12/02/2018 1323   CHOL 205 (H) 12/15/2013 0408   TRIG 86 07/18/2020 1028   TRIG 73 12/02/2018 1323   TRIG 154 12/15/2013 0408   HDL 46 07/18/2020 1028   HDL 49 12/15/2013 0408   CHOLHDL 2.5 10/11/2015 1027   VLDL 15 12/02/2018 1323   VLDL 31 12/15/2013 0408   LDLCALC 54 07/18/2020  1028   LDLCALC 125 (H) 12/15/2013 0408     Risk Assessment/Calculations:      Physical Exam:    VS:  BP 130/80 (BP Location: Left Arm, Patient Position: Sitting, Cuff Size: Large)   Pulse 80   Ht _0  (1.854 m)   Wt 269 lb 2 oz (122.1 kg)   SpO2 98%   BMI 35.51 kg/m     Wt Readings from Last 3 Encounters:  09/07/20 269 lb 2 oz (122.1 kg)  09/06/20 280 lb (127 kg)  08/21/20  275 lb (124.7 kg)     GEN:  Well nourished, well developed in no acute distress HEENT: Normal NECK: No JVD; No carotid bruits LYMPHATICS: No lymphadenopathy CARDIAC: RRR, no murmurs, rubs, gallops RESPIRATORY:  Clear to auscultation without rales, wheezing or rhonchi  ABDOMEN: Soft, non-tender, non-distended MUSCULOSKELETAL:  No edema; right extremity weakness noted SKIN: Warm and dry NEUROLOGIC:  Alert and oriented x 3 PSYCHIATRIC:  Normal affect   ASSESSMENT:    1. Cerebrovascular accident (CVA), unspecified mechanism ([place])   2. Primary hypertension   3. Pure hypercholesterolemia    PLAN:    In order of problems listed above:  1. History of CVA, on aspirin, Plavix, statin.  ILR implant has not shown any A. fib or flutter. Echocardiogram 09/2020 with preserved ejection fraction, no evidence of thrombus.  Continue physical therapy. 2. Hypertension, BP controlled.  Continue Coreg, Norvasc 3. Hyperlipidemia, continue Lipitor.  Follow-up in 1 year.   Medication Adjustments/Labs and Tests Ordered: Current medicines are reviewed at length with the patient today.  Concerns regarding medicines are outlined above.  No orders of the defined types were placed in this encounter.  No orders of the defined types were placed in this encounter.   Patient Instructions  Medication Instructions:  Your physician recommends that you continue on your current medications as directed. Please refer to the Current Medication list given to you today.  *If you need a refill on your cardiac medications before  your next appointment, please call your pharmacy*   Lab Work: None ordered If you have labs (blood work) drawn today and your tests are completely normal, you will receive your results only by: Marland Kitchen MyChart Message (if you have MyChart) OR . A paper copy in the mail If you have any lab test that is abnormal or we need to change your treatment, we will call you to review the results.   Testing/Procedures: None ordered   Follow-Up: At [facility], you and your health needs are our priority.  As part of our continuing mission to provide you with exceptional heart care, we have created designated Provider Care Teams.  These Care Teams include your primary Cardiologist (physician) and Advanced Practice Providers (APPs -  Physician Assistants and Nurse Practitioners) who all work together to provide you with the care you need, when you need it.  We recommend signing up for the patient portal called "MyChart".  Sign up information is provided on this After Visit Summary.  MyChart is used to connect with patients for Virtual Visits (Telemedicine).  Patients are able to view lab/test results, encounter notes, upcoming appointments, etc.  Non-urgent messages can be sent to your provider as well.   To learn more about what you can do with MyChart, go to [email or web address].    Your next appointment:   1 year(s)  The format for your next appointment:   In Person  Provider:   Kate Sable, MD   Other Instructions      Signed, Kate Sable, MD  09/07/2020 11:24 AM    [place]

## 2020-09-07 NOTE — Telephone Encounter (Signed)
Copied from CRM [id number]. Topic: Quick Communication - Rx Refill/Question >> Sep 06, 2020  4:41 PM Aretta Nip wrote: CRM for notification. See Telephone encounter for: 09/06/20. Pt was in today with an appt w/th [name] and there was a discussion to send the power wheelchair request to [facility], pt states now that it needs to go to [facility], Their phone is [phone]. They do not have fax# . They are sorry for any inconvenience.  Contact # [phone]

## 2020-09-07 NOTE — Telephone Encounter (Signed)
Noted and will sign

## 2020-09-07 NOTE — Patient Instructions (Signed)

## 2020-09-07 NOTE — Telephone Encounter (Signed)
Written prescription placed in [name]'s folder to be signed.

## 2020-09-07 NOTE — Telephone Encounter (Signed)
In your folder for signature 

## 2020-09-07 NOTE — Telephone Encounter (Signed)
-----   Message from [name], NP sent at 09/06/2020  7:59 PM EST ----- Please write script for a scooter for this patient and I will sign.  Have added all needed details to this note and family wishes order faxed to [facility].  Thank you:)

## 2020-09-07 NOTE — Telephone Encounter (Signed)
Written prescription is completed and placed in Dr.[name]'s folder to be signed.

## 2020-09-08 ENCOUNTER — Ambulatory Visit (INDEPENDENT_AMBULATORY_CARE_PROVIDER_SITE_OTHER): Payer: Medicare Other | Admitting: General Practice

## 2020-09-08 ENCOUNTER — Telehealth: Payer: Medicare Other | Admitting: General Practice

## 2020-09-08 DIAGNOSIS — G918 Other hydrocephalus: Secondary | ICD-10-CM

## 2020-09-08 DIAGNOSIS — Z8673 Personal history of transient ischemic attack (TIA), and cerebral infarction without residual deficits: Secondary | ICD-10-CM

## 2020-09-08 DIAGNOSIS — I152 Hypertension secondary to endocrine disorders: Secondary | ICD-10-CM

## 2020-09-08 DIAGNOSIS — E1129 Type 2 diabetes mellitus with other diabetic kidney complication: Secondary | ICD-10-CM

## 2020-09-08 DIAGNOSIS — N3941 Urge incontinence: Secondary | ICD-10-CM

## 2020-09-08 DIAGNOSIS — E1169 Type 2 diabetes mellitus with other specified complication: Secondary | ICD-10-CM

## 2020-09-08 DIAGNOSIS — E1159 Type 2 diabetes mellitus with other circulatory complications: Secondary | ICD-10-CM

## 2020-09-08 DIAGNOSIS — E785 Hyperlipidemia, unspecified: Secondary | ICD-10-CM

## 2020-09-08 NOTE — Patient Instructions (Signed)
Visit Information  PATIENT GOALS: Patient Care Plan: General Social Work (Adult)    Problem Identified: Coping Skills (General Plan of Care)     Goal: Coping Skills Enhanced   Start Date: 06/23/2020  Priority: Medium  Note:   Evidence-based guidance:   Acknowledge, normalize and validate difficulty of making life-long lifestyle changes.   Identify current effective and ineffective coping strategies.   Encourage patient and caregiver participation in care to increase self-esteem, confidence and feelings of control.   Consider alternative and complementary therapy approaches such as meditation, mindfulness or yoga.   Encourage participation in cognitive behavioral therapy to foster a positive identity, increase self-awareness, as well as bolster self-esteem, confidence and self-efficacy.   Discuss spirituality; be present as concerns are identified; encourage journaling, prayer, worship services, meditation or pastoral counseling.   Encourage participation in pleasurable group activities such as hobbies, singing, sports or volunteering).   Encourage the use of mindfulness; refer for training or intensive intervention.   Consider the use of meditative movement therapy such as tai chi, yoga or qigong.   Promote a regular daily exercise program based on tolerance, ability and patient choice to support positive thinking about disease or aging.   Notes:    Current Barriers:   Financial constraints  Limited social support  Level of care concerns  ADL IADL limitations  Mental Health Concerns   Social Isolation  Limited education about criss support resources within the area*  Limited access to caregiver  Cognitive Deficits  Memory Deficits  Lacks knowledge of community resource: financial assistance and support resources that are eligible for patient to use.  Clinical Social Work Clinical Goal(s):   Over the next 90 days, client will work with SW to address  concerns related to lack of self-care and ongoing financial barriers  Over the next 120 days, patient/caregiver will work with SW to address concerns related to lack of support/resource connection. LCSW will assist patient in gaining additional support/resource connection and community resource education in order to maintain health and mental health appropriately    Over the next 120 days, patient will demonstrate improved adherence to self care as evidenced by implementing healthy self-care into his daily routine such as: attending all medical appointments, deep breathing exercises, taking time for self-reflection, taking medications as prescribed, drinking water and daily exercise to improve mobility.    Over the next 120 days, patient will demonstrate improved health management independence as evidenced by implementing healthy self-care skills and positive support/resources into his daily routine to help cope with stressors and improve overall health and well-being                Over the next 120 days, patient or caregiver will verbalize basic understanding of depression/stress process and self health management plan as evidenced by his participation in development of long term plan of care and institution of self health management strategies  Interventions:  Patient interviewed and appropriate assessments performed  Provided patient's aide and patient with information about crisis support and financial assistance support resources within the area  Patient reports that he relocated to a nicer apartment and is residing with there with his brother who is an excellent support for him.   Patient shares that he was able to get relief from his stomach issues. Health coping skill education provided for chronic health care management.   Discussed plans with patient for ongoing care management follow up and provided patient with direct contact information for care management team  Confirmed  stable  transportation to patient's upcoming medical appointments.   Collaborated with patient's caregiver [name]  re: patient's need for additional resource support, DME and financial assistance.   Confirmed that patient now has a payee representative   Assisted patient/caregiver with obtaining information about health plan benefits  Provided education to patient/caregiver regarding level of care options.  Confirmed with patient's caregiver that patient new pair of eyeglasses are working well for him  Confirmed with patient that meals are still being delivered to him by [employer].   Patient Self Care Activities:   Attends all scheduled provider appointments  Calls provider office for new concerns or questions   Task: Support Psychosocial Response to Risk or Actual Health Condition   Note:   Care Management Activities:    - active listening utilized - counseling provided - current coping strategies identified - decision-making supported - healthy lifestyle promoted - journaling promoted - meditative movement therapy encouraged - mindfulness encouraged - participation in counseling encouraged - problem-solving facilitated - relaxation techniques promoted - self-reflection promoted - spiritual activities promoted - verbalization of feelings encouraged    Notes:    Patient Care Plan: RNCM: Management of HLD    Problem Identified: RNCM: Management of HLD   Priority: Medium    Goal: RNCM: HLD management   Priority: Medium  Note:   Current Barriers:   Poorly controlled hyperlipidemia, complicated by Brain syndrome, post CVA, ETOH use   Current antihyperlipidemic regimen: Atorvastatin 80 mg QD  Most recent lipid panel:   Lab Results   Component  Value  Date     CHOL  117  07/18/2020     HDL  46  07/18/2020     LDLCALC  54  07/18/2020     TRIG  86  07/18/2020     CHOLHDL  2.5  10/11/2015      ASCVD risk enhancing conditions: age 50,   DM, HTN,  former smoker  Unable to independently manage HLD  Unable to self administer medications as prescribed  Does not attend all scheduled provider appointments  Does not adhere to prescribed medication regimen  Lacks social connections  Unable to perform ADLs independently  Unable to perform IADLs independently  Does not maintain contact with provider office  Does not contact provider office for questions/concerns  RN Care Manager Clinical Goal(s):   Over the next 120 days, patient will work with Consulting civil engineer, providers, and care team towards execution of optimized self-health management plan  Over the next 120 days, patient will verbalize understanding of plan for effective management of HLD  Over the next 120 days, patient will work with [facility] and pcp to address needs related to HLD  Over the next 120 days, patient will attend all scheduled medical appointments: 10-19-2020 at 10:40.  Saw pcp on 09-06-2020 and cardiologist on 09-07-2020 Interventions:  Collaboration with Venita Lick, NP regarding development and update of comprehensive plan of care as evidenced by provider attestation and co-signature  Inter-disciplinary care team collaboration (see longitudinal plan of care)  Medication review performed; medication list updated in electronic medical record.   Inter-disciplinary care team collaboration (see longitudinal plan of care)  Referred to pharmacy team for assistance with HLD medication management  Evaluation of current treatment plan related to HLD and patient's adherence to plan as established by provider. 09-08-2020: The patient is doing well. He saw the cardiologist yesterday and pcp on 09-06-2020.  The patient has had no changes in his medications regimen. The  patients brother states he is doing well and eating good. Denies any new changes.   Advised patient to call the office for changes in condition or quesitons.  09-08-2020: Reviewed and reinforced    Provided education to patient re: following a heart healthy/ADA diet   Discussed plans with patient for ongoing care management follow up and provided patient with direct contact information for care management team  Reviewed scheduled/upcoming provider appointments including: 10-19-2020  Cut back on alcohol consumption   Patient Goals/Self-Care Activities:  Over the next 120 days, patient will:   - call for medicine refill 2 or 3 days before it runs out - call if I am sick and can't take my medicine - keep a list of all the medicines I take; vitamins and herbals too - learn to read medicine labels - use a pillbox to sort medicine - use an alarm clock or phone to remind me to take my medicine - change to whole grain breads, cereal, pasta - drink 6 to 8 glasses of water each day - eat 3 to 5 servings of fruits and vegetables each day - eat 5 or 6 small meals each day - fill half the plate with nonstarchy vegetables - limit fast food meals to no more than 1 per week - manage portion size - prepare main meal at home 3 to 5 days each week - be open to making changes - I can manage, know and watch for signs of a heart attack - if I have chest pain, call for help - learn about small changes that will make a big difference - learn my personal risk factors  - calendar and clock use encouraged - cognitive-stimulating activities promoted - consistent daily routine encouraged - extra time for response allowed - memory aid use encouraged - repetition utilized - social relationships promoted - written communication utilized  Follow Up Plan: Telephone follow up appointment with care management team member scheduled for: 11-17-2020 at 0945 am     Task: RNCM: HLD   Note:   Care Management Activities:    - calendar and clock use encouraged - cognitive-stimulating activities promoted - consistent daily routine encouraged - extra time for response allowed - memory aid use  encouraged - repetition utilized - social relationships promoted - written communication utilized       Patient Care Plan: RNCM: Urinary Incontinence (Adult)    Problem Identified: RNCM: Disorder Identification (Urinary Incontinence)   Priority: High    Goal: Urinary Incontinence Identified   Note:   Current Barriers:   Knowledge Deficits related to effective management of urge urinary incontinence   Chronic Disease Management support and education needs related to urinary incontinence   Lacks caregiver support.   Film/video editor.   Literacy barriers  Transportation barriers  Non-adherence to scheduled provider appointments  Non-adherence to prescribed medication regimen  Cognitive Deficits  Unable to independently manage urinary incontinence   Unable to self administer medications as prescribed  Does not attend all scheduled provider appointments  Does not adhere to prescribed medication regimen  Lacks social connections  Unable to perform IADLs independently  Does not maintain contact with provider office  Does not contact provider office for questions/concerns  Nurse Case Manager Clinical Goal(s):   Over the next 120 days, patient will verbalize understanding of plan for effective management of urinary incontinence   Over the next 120 days, patient will work with RNCM, pcp, and specialist to address needs related to urinary incontience   Over  the next 120 days, patient will attend all scheduled medical appointments: patient had an appointment today to see specialist but had an "accident" and urinated on himself. [name] said he would have to reschedule his brothers appointment. Reminded him to make sure he rescheduled the appointment. He was calling after call with RNCM.   Over the next 120 days, patient will demonstrate improved adherence to prescribed treatment plan for urinary incontinence  as evidenced bycontrol of incontinence episodes, decrease  in accidents and effective management  Interventions:   1:1 collaboration with Venita Lick, NP regarding development and update of comprehensive plan of care as evidenced by provider attestation and co-signature  Inter-disciplinary care team collaboration (see longitudinal plan of care)  Evaluation of current treatment plan related to urinary incontinence  and patient's adherence to plan as established by provider. 09-08-2020: The patients brother states his urinary incontinence has improved significantly.  The patient is doing well today. Has no new concerns related to urinary health.   Advised patient to call and get missed appointment rescheduled and the importance of follow up  Provided education to patient re: monitoring for urinary incontinence and frequency   Discussed plans with patient for ongoing care management follow up and provided patient with direct contact information for care management team  Patient Goals/Self-Care Activities Over the next 120  days, patient will:  - Patient will self administer medications as prescribed Patient will attend all scheduled provider appointments Patient will attend church or other social activities Patient will continue to perform ADL's independently Patient will continue to perform IADL's independently Patient will call provider office for new concerns or questions Patient will work with BSW to address care coordination needs and will continue to work with the clinical team to address health care and disease management related needs.   - nature and severity of incontinence assessed - use of voiding and symptom diary use promoted  Follow Up Plan: Telephone follow up appointment with care management team member scheduled for: 11-17-2020 at 0945 am       Task: RNCM: Identify Presence and Type of Urinary Incontinence   Note:   Care Management Activities:    - nature and severity of incontinence assessed - use of voiding and  symptom diary use promoted       Patient Care Plan: RNCM: Stroke (Adult)    Problem Identified: RNCM: Emotional Adjustment to Disease (Stroke) and Brain Hydrocephalus syndrome   Priority: Medium    Goal: RNCM: Optimal Coping: Stroke and Brain syndrome   Priority: Medium  Note:   Current Barriers:   Knowledge Deficits related to effective management of post CVA and brain hydrocephalus syndrome   Care Coordination needs related to community resource and support  in a patient with post CVA and Brain syndrome   Chronic Disease Management support and education needs related to management of post CVA and Brain hydrocephalus syndrome   Lacks caregiver support.   Literacy barriers  Non-adherence to scheduled provider appointments  Non-adherence to prescribed medication regimen  Cognitive Deficits  Unable to independently manage care and is dependant on his brother and other to help with his ADLs/IADLS, appointments, and other needs  Unable to self administer medications as prescribed  Does not attend all scheduled provider appointments  Does not adhere to prescribed medication regimen  Lacks social connections  Unable to perform IADLs independently  Does not maintain contact with provider office  Does not contact provider office for questions/concerns  Nurse Case Manager Clinical Goal(s):  Over the next 120 days, patient will verbalize understanding of plan for effective management of post CVA and brain hydrocephalus syndrome   Over the next 120 days, patient will work with [facility], CCM team, pcp, and specialist  to address needs related to changes in condition related to post CVA and brain hydrocephalus syndrome   Over the next 120 days, patient will attend all scheduled medical appointments: 10-19-2020 with pcp. Sees neurologist on a regular basis.   Interventions:   1:1 collaboration with Venita Lick, NP regarding development and update of comprehensive plan of  care as evidenced by provider attestation and co-signature  Inter-disciplinary care team collaboration (see longitudinal plan of care)  Evaluation of current treatment plan related to post CVA care, and brain hydrocephalus syndrome  and patient's adherence to plan as established by provider. 09-08-2020: The patient saw the pcp and cardiologist this week. He is stable at this time and his brother is taking his blood pressures at home regularly. Denies any new concerns at this time. The patient corresponded with the RNCM by speaker phone with his brother today. Order has been submitted for a power chair/scooter by the pcp to [employer] supply due to the patients mobility. The patient is using a cane when walking and PT has helped with strengthening. The patient has not had any new falls. Safety precautions reviewed with the patient and the patients brother.   Advised patient to call the provider for changes in condition or new quesitons   Provided education to patient re: safety in the home, evaluation of recent fall with ER visit. 09-08-2020: The patient remains safe in his home at present time.   Discussed plans with patient for ongoing care management follow up and provided patient with direct contact information for care management team  Reviewed scheduled/upcoming provider appointments including: 10-19-2020 at 10:40 am  Patient Goals/Self-Care Activities Over the next 120 days, patient will:  - Patient will self administer medications as prescribed Patient will attend all scheduled provider appointments Patient will call pharmacy for medication refills Patient will continue to perform ADL's independently Patient will continue to perform IADL's independently Patient will call provider office for new concerns or questions Patient will work with BSW to address care coordination needs and will continue to work with the clinical team to address health care and disease management related needs.    - counseling provided - decision-making supported - depression screen reviewed - goal-setting facilitated - positive reinforcement provided - problem-solving facilitated - self-care encouraged - self-reflection promoted - verbalization of feelings encouraged  Follow Up Plan: Telephone follow up appointment with care management team member scheduled for: 11-17-2020 at 9:45 am        Task: RNCM: Support Psychosocial Response to Stroke   Note:   Care Management Activities:    - counseling provided - decision-making supported - depression screen reviewed - goal-setting facilitated - positive reinforcement provided - problem-solving facilitated - self-care encouraged - self-reflection promoted - verbalization of feelings encouraged       Patient Care Plan: RNCM: Hypertension (Adult)    Problem Identified: RNCM: Hypertension (Hypertension)   Priority: Medium    Goal: RNCM: Hypertension Monitored   Priority: Medium  Note:   Objective:   Last practice recorded BP readings:   BP Readings from Last 3 Encounters:   09/07/20  130/80   09/06/20  126/81   08/21/20  116/80      Most recent eGFR/CrCl: No results found for: EGFR  No components found  for: CRCL Current Barriers:   Knowledge Deficits related to basic understanding of hypertension pathophysiology and self care management  Knowledge Deficits related to understanding of medications prescribed for management of hypertension  Non-adherence to prescribed medication regimen  Non-adherence to scheduled provider appointments  Transportation barriers  Cognitive Deficits  Limited Social Support  Unable to independently manage HTN  Unable to self administer medications as prescribed  Does not attend all scheduled provider appointments  Does not adhere to prescribed medication regimen  Lacks social connections  Unable to perform IADLs independently  Does not maintain contact with provider  office  Does not contact provider office for questions/concerns Case Manager Clinical Goal(s):   Over the next 120 days, patient will verbalize understanding of plan for hypertension management  Over the next 120 days, patient will attend all scheduled medical appointments: 10-19-2020 with pcp, saw pcp on 09-06-2020 and cardiologist on 09-07-2020  Over the next 120 days, patient will demonstrate improved adherence to prescribed treatment plan for hypertension as evidenced by taking all medications as prescribed, monitoring and recording blood pressure as directed, adhering to low sodium/DASH diet  Over the next 120 days, patient will demonstrate improved health management independence as evidenced by checking blood pressure as directed and notifying PCP if SBP>160 or DBP > 90, taking all medications as prescribe, and adhering to a low sodium diet as discussed.  Over the next 120 days, patient will verbalize basic understanding of hypertension disease process and self health management plan as evidenced by effective management of HTN in the home setting, continuing to take blood pressures at home and calling for changes in condition  Interventions:   Collaboration with Venita Lick, NP regarding development and update of comprehensive plan of care as evidenced by provider attestation and co-signature  Inter-disciplinary care team collaboration (see longitudinal plan of care)  Evaluation of current treatment plan related to hypertension self management and patient's adherence to plan as established by provider. 09-08-2020: The patient is doing well. The brother states he has had no changes in his conditions. He is eating well and sleeping well.   Provided education to patient re: stroke prevention, s/s of heart attack and stroke, DASH diet, complications of uncontrolled blood pressure  Reviewed medications with patient and discussed importance of compliance  Discussed plans with patient for  ongoing care management follow up and provided patient with direct contact information for care management team  Advised patient, providing education and rationale, to monitor blood pressure daily and record, calling PCP for findings outside established parameters. The brother is checking his blood pressure daily and recording. This am his blood pressure was 107/82. Praised for accomplishment of checking blood pressures in the home setting. 09-08-2020: Blood pressure readings at home for the last 3 readings: 160/100, 148/90, and 143/70. Praised for taking readings. Education on monitoring for blood pressures systolic >062 and diastolic >37.  The patients brother verbalized understanding.   Reviewed scheduled/upcoming provider appointments including: 10-19-2020.   Patient Goals/Self-Care Activities  Over the next 120 days, patient will:  - UNABLE to independently manage HTN Self administers medications as prescribed Attends all scheduled provider appointments Calls provider office for new concerns, questions, or BP outside discussed parameters Checks BP and records as discussed Follows a low sodium diet/DASH diet - blood pressure trends reviewed - depression screen reviewed - home or ambulatory blood pressure monitoring encouraged Follow Up Plan: Telephone follow up appointment with care management team member scheduled for: 11-17-2020 at 0945 am   Task:  RNCM: Identify and Monitor Blood Pressure Elevation   Note:   Care Management Activities:    - blood pressure trends reviewed - depression screen reviewed - home or ambulatory blood pressure monitoring encouraged       Patient Care Plan: RNCM: Diabetes Type 2 (Adult)    Problem Identified: RNCM: Glycemic Management (Diabetes, Type 2)   Priority: Medium    Goal: RNCM: Glycemic Management Optimized   Priority: Medium  Note:   Objective:  Lab Results  Component Value Date   HGBA1C 6.1 07/18/2020    Lab Results  Component Value  Date   CREATININE 0.97 07/18/2020   CREATININE 1.20 07/13/2020   CREATININE 1.08 01/11/2020     No results found for: EGFR Current Barriers:   Knowledge Deficits related to basic Diabetes pathophysiology and self care/management  Knowledge Deficits related to medications used for management of diabetes  Literacy barriers  Cognitive Deficits  Limited Social Support  Unable to self administer medications as prescribed  Does not attend all scheduled provider appointments  Does not adhere to prescribed medication regimen  Lacks social connections  Unable to perform IADLs independently  Does not maintain contact with provider office  Does not contact provider office for questions/concerns Case Manager Clinical Goal(s):   Collaboration with Marnee Guarneri T, NP regarding development and update of comprehensive plan of care as evidenced by provider attestation and co-signature  Inter-disciplinary care team collaboration (see longitudinal plan of care)  Over the next 120 days, patient will demonstrate improved adherence to prescribed treatment plan for diabetes self care/management as evidenced by:   daily monitoring and recording of CBG: 09-08-2020: last 3 readings are 134, 102, 112.  The patients brother had not taken results at the time of the call. Praised the brother for consistently checking his blood sugars.   adherence to ADA/ carb modified diet. 09-08-2020: Review and education on eating healthy options   adherence to prescribed medication regimen. 09-08-2020: Confirms adherence to medications regimen  Interventions:   Provided education to patient about basic DM disease process  Reviewed medications with patient and discussed importance of medication adherence  Discussed plans with patient for ongoing care management follow up and provided patient with direct contact information for care management team  Provided patient with written educational materials related to  hypo and hyperglycemia and importance of correct treatment  Reviewed scheduled/upcoming provider appointments including: 10-19-2020  Advised patient, providing education and rationale, to check cbg BID and record, calling pcp for findings outside established parameters.  Readings for 07-28-2020 was 97 and 07-27-2020 was 119. 09-08-2020: Last 3 readings: 134, 102, 112. Denies any episodes of highs or lows.   Review of patient status, including review of consultants reports, relevant laboratory and other test results, and medications completed. Patient Goals/Self-Care Activities  Over the next 120 days, patient will:  - UNABLE to independently manage DM Self administers oral medications as prescribed Self administers insulin as prescribed Attends all scheduled provider appointments Checks blood sugars as prescribed and utilize hyper and hypoglycemia protocol as needed Adheres to prescribed ADA/carb modified - barriers to adherence to treatment plan identified - blood glucose monitoring encouraged - blood glucose readings reviewed - individualized medical nutrition therapy provided - mutual A1C goal set or reviewed - resources required to improve adherence to care identified - self-awareness of signs/symptoms of hypo or hyperglycemia encouraged - use of blood glucose monitoring log promoted Follow Up Plan: Telephone follow up appointment with care management team member scheduled for: 11-17-2020 at  0945 am   Task: RNCM: Alleviate Barriers to Glycemic Management   Note:   Care Management Activities:    - barriers to adherence to treatment plan identified - blood glucose monitoring encouraged - blood glucose readings reviewed - individualized medical nutrition therapy provided - mutual A1C goal set or reviewed - resources required to improve adherence to care identified - self-awareness of signs/symptoms of hypo or hyperglycemia encouraged - use of blood glucose monitoring log promoted          Patient verbalizes understanding of instructions provided today and agrees to view in [place].   Telephone follow up appointment with care management team member scheduled for: 11-17-2020 at [address] am  Noreene Larsson RN, MSN, [facility] Mobile: [phone]

## 2020-09-08 NOTE — Chronic Care Management (AMB) (Signed)
Chronic Care Management   CCM RN Visit Note  09/08/2020 Name: [name] MRN: [id number] DOB: 03/09/1971  Subjective: [name] is a 50 y.o. year old male who is a primary care patient of Cannady, Barbaraann Faster, NP. The care management team was consulted for assistance with disease management and care coordination needs.    Engaged with patient by telephone for follow up visit in response to provider referral for case management and/or care coordination services. Spoke to the patients brother Bevan Disney with patient on speaker phone.   Consent to Services:  The patient was given information about Chronic Care Management services, agreed to services, and gave verbal consent prior to initiation of services.  Please see initial visit note for detailed documentation.   Patient agreed to services and verbal consent obtained.   Assessment: Review of patient past medical history, allergies, medications, health status, including review of consultants reports, laboratory and other test data, was performed as part of comprehensive evaluation and provision of chronic care management services.   SDOH (Social Determinants of Health) assessments and interventions performed:    CCM Care Plan  No Known Allergies  Outpatient Encounter Medications as of 09/08/2020  Medication Sig   amLODipine (NORVASC) 10 MG tablet Take 1 tablet (10 mg total) by mouth daily.   ASPIR-LOW 81 MG EC tablet TAKE 1 TABLET BY MOUTH ONCE DAILY   atorvastatin (LIPITOR) 80 MG tablet TAKE 1 TABLET BY MOUTH ONCE DAILY AT 6PM   Blood Glucose Monitoring Suppl ([place]) w/Device KIT Use to check blood sugar at least 2-3 times a day, in morning fasting and then 2 hours after a meal.   Blood Pressure Monitoring (BLOOD PRESSURE MONITOR AUTOMAT) DEVI Use to monitor blood pressure once daily.   carvedilol (COREG) 6.25 MG tablet TAKE 1 TABLET BY MOUTH TWICE DAILY WITH A MEAL   citalopram (CELEXA) 10 MG tablet  Take 1 tablet (10 mg total) by mouth daily.   clopidogrel (PLAVIX) 75 MG tablet TAKE 1 TABLET BY MOUTH ONCE DAILY WITH BREAKFAST   donepezil (ARICEPT) 5 MG tablet Take 1 tablet by mouth daily.   gabapentin (NEURONTIN) 300 MG capsule Take 1 capsule (300 mg total) by mouth 3 (three) times daily.   glucose blood (ONETOUCH VERIO) test strip Use to check blood sugar at least 2-3 times a day, in morning fasting and 2 hours after a meal.   Lancet Devices (ACCU-CHEK SOFTCLIX) lancets Use as instructed   lisinopril (ZESTRIL) 5 MG tablet Take 1 tablet (5 mg total) by mouth daily.   metFORMIN (GLUCOPHAGE-XR) 500 MG 24 hr tablet Take 2 tablets (1,000 mg total) by mouth in the morning and at bedtime.   Na Sulfate-K Sulfate-Mg Sulf 17.5-3.13-1.6 GM/177ML SOLN At 5 PM the day before procedure take 1 bottle and 5 hours before procedure take 1 bottle.   tamsulosin (FLOMAX) 0.4 MG CAPS capsule Take 0.4 mg by mouth daily.   No facility-administered encounter medications on file as of 09/08/2020.    Patient Active Problem List   Diagnosis Date Noted   Poor mobility 09/06/2020   Type 2 diabetes mellitus with proteinuria ([place]) 12/31/2019   Incontinence of feces 12/10/2019   Urge incontinence of urine 12/10/2019   Chronic communicating hydrocephalus ([place]) 04/22/2019   Vitamin B12 deficiency 12/02/2018   Chronic pain of right ankle 12/18/2015   Morbid obesity ([place]) 10/16/2015   Hyperlipidemia associated with type 2 diabetes mellitus ([place]) 09/13/2015   History of stroke 12/17/2013  Hypertension associated with diabetes ([place]) 12/17/2013   Cardiomyopathy ([place]) 12/17/2013   Type 2 diabetes mellitus with diabetic polyneuropathy ([place]) 12/17/2013    Conditions to be addressed/monitored:HTN, HLD, DMII and Past CVA and brain hydrochephalus syndrome and urinary incontience   Care Plan : RNCM: Management of HLD  Updates made by Vanita Ingles since 09/08/2020 12:00 AM    Problem: RNCM: Management  of HLD   Priority: Medium    Goal: RNCM: HLD management   Priority: Medium  Note:   Current Barriers:   Poorly controlled hyperlipidemia, complicated by Brain syndrome, post CVA, ETOH use   Current antihyperlipidemic regimen: Atorvastatin 80 mg QD  Most recent lipid panel:   Lab Results   Component  Value  Date     CHOL  117  07/18/2020     HDL  46  07/18/2020     LDLCALC  54  07/18/2020     TRIG  86  07/18/2020     CHOLHDL  2.5  10/11/2015      ASCVD risk enhancing conditions: age 36,  DM, HTN,  former smoker  Unable to independently manage HLD  Unable to self administer medications as prescribed  Does not attend all scheduled provider appointments  Does not adhere to prescribed medication regimen  Lacks social connections  Unable to perform ADLs independently  Unable to perform IADLs independently  Does not maintain contact with provider office  Does not contact provider office for questions/concerns  RN Care Manager Clinical Goal(s):   Over the next 120 days, patient will work with Consulting civil engineer, providers, and care team towards execution of optimized self-health management plan  Over the next 120 days, patient will verbalize understanding of plan for effective management of HLD  Over the next 120 days, patient will work with [facility] and pcp to address needs related to HLD  Over the next 120 days, patient will attend all scheduled medical appointments: 10-19-2020 at 10:40.  Saw pcp on 09-06-2020 and cardiologist on 09-07-2020 Interventions:  Collaboration with Venita Lick, NP regarding development and update of comprehensive plan of care as evidenced by provider attestation and co-signature  Inter-disciplinary care team collaboration (see longitudinal plan of care)  Medication review performed; medication list updated in electronic medical record.   Inter-disciplinary care team collaboration (see longitudinal plan of  care)  Referred to pharmacy team for assistance with HLD medication management  Evaluation of current treatment plan related to HLD and patient's adherence to plan as established by provider. 09-08-2020: The patient is doing well. He saw the cardiologist yesterday and pcp on 09-06-2020.  The patient has had no changes in his medications regimen. The patients brother states he is doing well and eating good. Denies any new changes.   Advised patient to call the office for changes in condition or quesitons.  09-08-2020: Reviewed and reinforced   Provided education to patient re: following a heart healthy/ADA diet   Discussed plans with patient for ongoing care management follow up and provided patient with direct contact information for care management team  Reviewed scheduled/upcoming provider appointments including: 10-19-2020  Cut back on alcohol consumption   Patient Goals/Self-Care Activities:  Over the next 120 days, patient will:   - call for medicine refill 2 or 3 days before it runs out - call if I am sick and can't take my medicine - keep a list of all the medicines I take; vitamins and herbals too - learn to read medicine labels - use  a pillbox to sort medicine - use an alarm clock or phone to remind me to take my medicine - change to whole grain breads, cereal, pasta - drink 6 to 8 glasses of water each day - eat 3 to 5 servings of fruits and vegetables each day - eat 5 or 6 small meals each day - fill half the plate with nonstarchy vegetables - limit fast food meals to no more than 1 per week - manage portion size - prepare main meal at home 3 to 5 days each week - be open to making changes - I can manage, know and watch for signs of a heart attack - if I have chest pain, call for help - learn about small changes that will make a big difference - learn my personal risk factors  - calendar and clock use encouraged - cognitive-stimulating activities promoted - consistent  daily routine encouraged - extra time for response allowed - memory aid use encouraged - repetition utilized - social relationships promoted - written communication utilized  Follow Up Plan: Telephone follow up appointment with care management team member scheduled for: 11-17-2020 at 0945 am     Care Plan : RNCM: Urinary Incontinence (Adult)  Updates made by Vanita Ingles since 09/08/2020 12:00 AM    Problem: RNCM: Disorder Identification (Urinary Incontinence)   Priority: High    Goal: Urinary Incontinence Identified   Note:   Current Barriers:   Knowledge Deficits related to effective management of urge urinary incontinence   Chronic Disease Management support and education needs related to urinary incontinence   Lacks caregiver support.   Film/video editor.   Literacy barriers  Transportation barriers  Non-adherence to scheduled provider appointments  Non-adherence to prescribed medication regimen  Cognitive Deficits  Unable to independently manage urinary incontinence   Unable to self administer medications as prescribed  Does not attend all scheduled provider appointments  Does not adhere to prescribed medication regimen  Lacks social connections  Unable to perform IADLs independently  Does not maintain contact with provider office  Does not contact provider office for questions/concerns  Nurse Case Manager Clinical Goal(s):   Over the next 120 days, patient will verbalize understanding of plan for effective management of urinary incontinence   Over the next 120 days, patient will work with RNCM, pcp, and specialist to address needs related to urinary incontience   Over the next 120 days, patient will attend all scheduled medical appointments: patient had an appointment today to see specialist but had an "accident" and urinated on himself. [name] said he would have to reschedule his brothers appointment. Reminded him to make sure he rescheduled the  appointment. He was calling after call with RNCM.   Over the next 120 days, patient will demonstrate improved adherence to prescribed treatment plan for urinary incontinence  as evidenced bycontrol of incontinence episodes, decrease in accidents and effective management  Interventions:   1:1 collaboration with Venita Lick, NP regarding development and update of comprehensive plan of care as evidenced by provider attestation and co-signature  Inter-disciplinary care team collaboration (see longitudinal plan of care)  Evaluation of current treatment plan related to urinary incontinence  and patient's adherence to plan as established by provider. 09-08-2020: The patients brother states his urinary incontinence has improved significantly.  The patient is doing well today. Has no new concerns related to urinary health.   Advised patient to call and get missed appointment rescheduled and the importance of follow up  Provided education to  patient re: monitoring for urinary incontinence and frequency   Discussed plans with patient for ongoing care management follow up and provided patient with direct contact information for care management team  Patient Goals/Self-Care Activities Over the next 120  days, patient will:  - Patient will self administer medications as prescribed Patient will attend all scheduled provider appointments Patient will attend church or other social activities Patient will continue to perform ADL's independently Patient will continue to perform IADL's independently Patient will call provider office for new concerns or questions Patient will work with BSW to address care coordination needs and will continue to work with the clinical team to address health care and disease management related needs.   - nature and severity of incontinence assessed - use of voiding and symptom diary use promoted  Follow Up Plan: Telephone follow up appointment with care management team  member scheduled for: 11-17-2020 at 0945 am       Care Plan : RNCM: Stroke (Adult)  Updates made by Vanita Ingles since 09/08/2020 12:00 AM    Problem: RNCM: Emotional Adjustment to Disease (Stroke) and Brain Hydrocephalus syndrome   Priority: Medium    Goal: RNCM: Optimal Coping: Stroke and Brain syndrome   Priority: Medium  Note:   Current Barriers:   Knowledge Deficits related to effective management of post CVA and brain hydrocephalus syndrome   Care Coordination needs related to community resource and support  in a patient with post CVA and Brain syndrome   Chronic Disease Management support and education needs related to management of post CVA and Brain hydrocephalus syndrome   Lacks caregiver support.   Literacy barriers  Non-adherence to scheduled provider appointments  Non-adherence to prescribed medication regimen  Cognitive Deficits  Unable to independently manage care and is dependant on his brother and other to help with his ADLs/IADLS, appointments, and other needs  Unable to self administer medications as prescribed  Does not attend all scheduled provider appointments  Does not adhere to prescribed medication regimen  Lacks social connections  Unable to perform IADLs independently  Does not maintain contact with provider office  Does not contact provider office for questions/concerns  Nurse Case Manager Clinical Goal(s):   Over the next 120 days, patient will verbalize understanding of plan for effective management of post CVA and brain hydrocephalus syndrome   Over the next 120 days, patient will work with [facility], CCM team, pcp, and specialist  to address needs related to changes in condition related to post CVA and brain hydrocephalus syndrome   Over the next 120 days, patient will attend all scheduled medical appointments: 10-19-2020 with pcp. Sees neurologist on a regular basis.   Interventions:   1:1 collaboration with Venita Lick, NP  regarding development and update of comprehensive plan of care as evidenced by provider attestation and co-signature  Inter-disciplinary care team collaboration (see longitudinal plan of care)  Evaluation of current treatment plan related to post CVA care, and brain hydrocephalus syndrome  and patient's adherence to plan as established by provider. 09-08-2020: The patient saw the pcp and cardiologist this week. He is stable at this time and his brother is taking his blood pressures at home regularly. Denies any new concerns at this time. The patient corresponded with the RNCM by speaker phone with his brother today. Order has been submitted for a power chair/scooter by the pcp to [employer] supply due to the patients mobility. The patient is using a cane when walking and PT has helped  with strengthening. The patient has not had any new falls. Safety precautions reviewed with the patient and the patients brother.   Advised patient to call the provider for changes in condition or new quesitons   Provided education to patient re: safety in the home, evaluation of recent fall with ER visit. 09-08-2020: The patient remains safe in his home at present time.   Discussed plans with patient for ongoing care management follow up and provided patient with direct contact information for care management team  Reviewed scheduled/upcoming provider appointments including: 10-19-2020 at 10:40 am  Patient Goals/Self-Care Activities Over the next 120 days, patient will:  - Patient will self administer medications as prescribed Patient will attend all scheduled provider appointments Patient will call pharmacy for medication refills Patient will continue to perform ADL's independently Patient will continue to perform IADL's independently Patient will call provider office for new concerns or questions Patient will work with BSW to address care coordination needs and will continue to work with the clinical team to  address health care and disease management related needs.   - counseling provided - decision-making supported - depression screen reviewed - goal-setting facilitated - positive reinforcement provided - problem-solving facilitated - self-care encouraged - self-reflection promoted - verbalization of feelings encouraged  Follow Up Plan: Telephone follow up appointment with care management team member scheduled for: 11-17-2020 at 9:45 am        Care Plan : RNCM: Hypertension (Adult)  Updates made by Vanita Ingles since 09/08/2020 12:00 AM    Problem: RNCM: Hypertension (Hypertension)   Priority: Medium    Goal: RNCM: Hypertension Monitored   Priority: Medium  Note:   Objective:   Last practice recorded BP readings:   BP Readings from Last 3 Encounters:   09/07/20  130/80   09/06/20  126/81   08/21/20  116/80      Most recent eGFR/CrCl: No results found for: EGFR  No components found for: CRCL Current Barriers:   Knowledge Deficits related to basic understanding of hypertension pathophysiology and self care management  Knowledge Deficits related to understanding of medications prescribed for management of hypertension  Non-adherence to prescribed medication regimen  Non-adherence to scheduled provider appointments  Transportation barriers  Cognitive Deficits  Limited Social Support  Unable to independently manage HTN  Unable to self administer medications as prescribed  Does not attend all scheduled provider appointments  Does not adhere to prescribed medication regimen  Lacks social connections  Unable to perform IADLs independently  Does not maintain contact with provider office  Does not contact provider office for questions/concerns Case Manager Clinical Goal(s):   Over the next 120 days, patient will verbalize understanding of plan for hypertension management  Over the next 120 days, patient will attend all scheduled medical appointments:  10-19-2020 with pcp, saw pcp on 09-06-2020 and cardiologist on 09-07-2020  Over the next 120 days, patient will demonstrate improved adherence to prescribed treatment plan for hypertension as evidenced by taking all medications as prescribed, monitoring and recording blood pressure as directed, adhering to low sodium/DASH diet  Over the next 120 days, patient will demonstrate improved health management independence as evidenced by checking blood pressure as directed and notifying PCP if SBP>160 or DBP > 90, taking all medications as prescribe, and adhering to a low sodium diet as discussed.  Over the next 120 days, patient will verbalize basic understanding of hypertension disease process and self health management plan as evidenced by effective management of HTN in the home  setting, continuing to take blood pressures at home and calling for changes in condition  Interventions:   Collaboration with Venita Lick, NP regarding development and update of comprehensive plan of care as evidenced by provider attestation and co-signature  Inter-disciplinary care team collaboration (see longitudinal plan of care)  Evaluation of current treatment plan related to hypertension self management and patient's adherence to plan as established by provider. 09-08-2020: The patient is doing well. The brother states he has had no changes in his conditions. He is eating well and sleeping well.   Provided education to patient re: stroke prevention, s/s of heart attack and stroke, DASH diet, complications of uncontrolled blood pressure  Reviewed medications with patient and discussed importance of compliance  Discussed plans with patient for ongoing care management follow up and provided patient with direct contact information for care management team  Advised patient, providing education and rationale, to monitor blood pressure daily and record, calling PCP for findings outside established parameters. The brother is  checking his blood pressure daily and recording. This am his blood pressure was 107/82. Praised for accomplishment of checking blood pressures in the home setting. 09-08-2020: Blood pressure readings at home for the last 3 readings: 160/100, 148/90, and 143/70. Praised for taking readings. Education on monitoring for blood pressures systolic >932 and diastolic >67.  The patients brother verbalized understanding.   Reviewed scheduled/upcoming provider appointments including: 10-19-2020.   Patient Goals/Self-Care Activities  Over the next 120 days, patient will:  - UNABLE to independently manage HTN Self administers medications as prescribed Attends all scheduled provider appointments Calls provider office for new concerns, questions, or BP outside discussed parameters Checks BP and records as discussed Follows a low sodium diet/DASH diet - blood pressure trends reviewed - depression screen reviewed - home or ambulatory blood pressure monitoring encouraged Follow Up Plan: Telephone follow up appointment with care management team member scheduled for: 11-17-2020 at 0945 am   Care Plan : RNCM: Diabetes Type 2 (Adult)  Updates made by Vanita Ingles since 09/08/2020 12:00 AM    Problem: RNCM: Glycemic Management (Diabetes, Type 2)   Priority: Medium    Goal: RNCM: Glycemic Management Optimized   Priority: Medium  Note:   Objective:  Lab Results  Component Value Date   HGBA1C 6.1 07/18/2020    Lab Results  Component Value Date   CREATININE 0.97 07/18/2020   CREATININE 1.20 07/13/2020   CREATININE 1.08 01/11/2020     No results found for: EGFR Current Barriers:   Knowledge Deficits related to basic Diabetes pathophysiology and self care/management  Knowledge Deficits related to medications used for management of diabetes  Literacy barriers  Cognitive Deficits  Limited Social Support  Unable to self administer medications as prescribed  Does not attend all scheduled provider  appointments  Does not adhere to prescribed medication regimen  Lacks social connections  Unable to perform IADLs independently  Does not maintain contact with provider office  Does not contact provider office for questions/concerns Case Manager Clinical Goal(s):   Collaboration with Marnee Guarneri T, NP regarding development and update of comprehensive plan of care as evidenced by provider attestation and co-signature  Inter-disciplinary care team collaboration (see longitudinal plan of care)  Over the next 120 days, patient will demonstrate improved adherence to prescribed treatment plan for diabetes self care/management as evidenced by:   daily monitoring and recording of CBG: 09-08-2020: last 3 readings are 134, 102, 112.  The patients brother had not taken results at the  time of the call. Praised the brother for consistently checking his blood sugars.   adherence to ADA/ carb modified diet. 09-08-2020: Review and education on eating healthy options   adherence to prescribed medication regimen. 09-08-2020: Confirms adherence to medications regimen  Interventions:   Provided education to patient about basic DM disease process  Reviewed medications with patient and discussed importance of medication adherence  Discussed plans with patient for ongoing care management follow up and provided patient with direct contact information for care management team  Provided patient with written educational materials related to hypo and hyperglycemia and importance of correct treatment  Reviewed scheduled/upcoming provider appointments including: 10-19-2020  Advised patient, providing education and rationale, to check cbg BID and record, calling pcp for findings outside established parameters.  Readings for 07-28-2020 was 97 and 07-27-2020 was 119. 09-08-2020: Last 3 readings: 134, 102, 112. Denies any episodes of highs or lows.   Review of patient status, including review of consultants reports,  relevant laboratory and other test results, and medications completed. Patient Goals/Self-Care Activities  Over the next 120 days, patient will:  - UNABLE to independently manage DM Self administers oral medications as prescribed Self administers insulin as prescribed Attends all scheduled provider appointments Checks blood sugars as prescribed and utilize hyper and hypoglycemia protocol as needed Adheres to prescribed ADA/carb modified - barriers to adherence to treatment plan identified - blood glucose monitoring encouraged - blood glucose readings reviewed - individualized medical nutrition therapy provided - mutual A1C goal set or reviewed - resources required to improve adherence to care identified - self-awareness of signs/symptoms of hypo or hyperglycemia encouraged - use of blood glucose monitoring log promoted Follow Up Plan: Telephone follow up appointment with care management team member scheduled for: 11-17-2020 at 0945 am     Plan:Telephone follow up appointment with care management team member scheduled for:  11-17-2020 at [place] am  Noreene Larsson RN, MSN, [facility] Mobile: [phone]

## 2020-09-13 ENCOUNTER — Telehealth: Payer: Self-pay

## 2020-09-13 NOTE — Telephone Encounter (Signed)
[name] called in to inquire of paper work left on 09/06/20 to be completed and faxed to 2 different locations would like to come in and collect copies of this paper work. Please call Ph#  [phone]

## 2020-09-14 ENCOUNTER — Telehealth: Payer: Self-pay | Admitting: Licensed Clinical Social Worker

## 2020-09-14 ENCOUNTER — Ambulatory Visit: Payer: Medicare Other | Admitting: Licensed Clinical Social Worker

## 2020-09-14 DIAGNOSIS — E1129 Type 2 diabetes mellitus with other diabetic kidney complication: Secondary | ICD-10-CM

## 2020-09-14 DIAGNOSIS — R809 Proteinuria, unspecified: Secondary | ICD-10-CM

## 2020-09-14 DIAGNOSIS — I152 Hypertension secondary to endocrine disorders: Secondary | ICD-10-CM

## 2020-09-14 DIAGNOSIS — E785 Hyperlipidemia, unspecified: Secondary | ICD-10-CM

## 2020-09-14 DIAGNOSIS — E1169 Type 2 diabetes mellitus with other specified complication: Secondary | ICD-10-CM

## 2020-09-14 DIAGNOSIS — G918 Other hydrocephalus: Secondary | ICD-10-CM

## 2020-09-14 DIAGNOSIS — E1159 Type 2 diabetes mellitus with other circulatory complications: Secondary | ICD-10-CM | POA: Diagnosis not present

## 2020-09-14 NOTE — Telephone Encounter (Signed)
  Chronic Care Management    Clinical Social Work General Follow Up Note  09/14/2020 Name: ZAKIAH BECKERMAN MRN: [id number] DOB: 01-08-1971  [name] is a 50 y.o. year old male who is a primary care patient of Cannady, Barbaraann Faster, NP. The CCM team was consulted for assistance with [employer] .   Review of patient status, including review of consultants reports, relevant laboratory and other test results, and collaboration with appropriate care team members and the patient's provider was performed as part of comprehensive patient evaluation and provision of chronic care management services.    LCSW completed CCM outreach attempt today but was unable to reach patient successfully. A HIPPA compliant voice message was left encouraging patient to return call once available. LCSW will ask Scheduling Care Guide to reschedule CCM SW appointment with patient as well.   Outpatient Encounter Medications as of 09/14/2020  Medication Sig  . amLODipine (NORVASC) 10 MG tablet Take 1 tablet (10 mg total) by mouth daily.  . ASPIR-LOW 81 MG EC tablet TAKE 1 TABLET BY MOUTH ONCE DAILY  . atorvastatin (LIPITOR) 80 MG tablet TAKE 1 TABLET BY MOUTH ONCE DAILY AT 6PM  . Blood Glucose Monitoring Suppl ([place] FLEX SYSTEM) w/Device KIT Use to check blood sugar at least 2-3 times a day, in morning fasting and then 2 hours after a meal.  . Blood Pressure Monitoring (BLOOD PRESSURE MONITOR AUTOMAT) DEVI Use to monitor blood pressure once daily.  . carvedilol (COREG) 6.25 MG tablet TAKE 1 TABLET BY MOUTH TWICE DAILY WITH A MEAL  . citalopram (CELEXA) 10 MG tablet Take 1 tablet (10 mg total) by mouth daily.  . clopidogrel (PLAVIX) 75 MG tablet TAKE 1 TABLET BY MOUTH ONCE DAILY WITH BREAKFAST  . donepezil (ARICEPT) 5 MG tablet Take 1 tablet by mouth daily.  Marland Kitchen gabapentin (NEURONTIN) 300 MG capsule Take 1 capsule (300 mg total) by mouth 3 (three) times daily.  Marland Kitchen glucose blood (ONETOUCH VERIO) test strip Use to  check blood sugar at least 2-3 times a day, in morning fasting and 2 hours after a meal.  . Lancet Devices (ACCU-CHEK SOFTCLIX) lancets Use as instructed  . lisinopril (ZESTRIL) 5 MG tablet Take 1 tablet (5 mg total) by mouth daily.  . metFORMIN (GLUCOPHAGE-XR) 500 MG 24 hr tablet Take 2 tablets (1,000 mg total) by mouth in the morning and at bedtime.  . Na Sulfate-K Sulfate-Mg Sulf 17.5-3.13-1.6 GM/177ML SOLN At 5 PM the day before procedure take 1 bottle and 5 hours before procedure take 1 bottle.  . tamsulosin (FLOMAX) 0.4 MG CAPS capsule Take 0.4 mg by mouth daily.   No facility-administered encounter medications on file as of 09/14/2020.    Follow Up Plan: Annetta will reach out to patient to reschedule appointment.   Eula Fried, BSW, MSW, [facility] Care Management [email or web address] Phone: [phone]
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
## 2020-09-14 NOTE — Progress Notes (Signed)
Please send CAP paper to DSS.  It was accidentally sent to [facility].  Thanks.:)

## 2020-09-15 NOTE — Telephone Encounter (Signed)
Paperwork faxed again to DSS and patient notified that a copy was placed up front for patient to pick up.

## 2020-09-19 ENCOUNTER — Telehealth: Payer: Self-pay | Admitting: *Deleted

## 2020-09-19 NOTE — Telephone Encounter (Signed)
Form for PARATRANSIT brought in for Dr. Harvest Dark to fill out. Placed in [facility] for REVIEW

## 2020-10-02 ENCOUNTER — Ambulatory Visit (INDEPENDENT_AMBULATORY_CARE_PROVIDER_SITE_OTHER): Payer: Medicare Other | Admitting: Urology

## 2020-10-02 ENCOUNTER — Other Ambulatory Visit: Payer: Self-pay

## 2020-10-02 VITALS — BP 116/77 | HR 66

## 2020-10-02 DIAGNOSIS — R32 Unspecified urinary incontinence: Secondary | ICD-10-CM

## 2020-10-02 DIAGNOSIS — I639 Cerebral infarction, unspecified: Secondary | ICD-10-CM | POA: Diagnosis not present

## 2020-10-02 DIAGNOSIS — N3941 Urge incontinence: Secondary | ICD-10-CM | POA: Diagnosis not present

## 2020-10-02 LAB — BLADDER SCAN AMB NON-IMAGING: Scan Result: 0

## 2020-10-02 NOTE — Progress Notes (Signed)
10/02/2020 9:24 AM   [name] [birth year] [id number]  Referring provider: Venita Lick, NP [address] [place],  [place] [address]  No chief complaint on file.   HPI: I was consulted to assist the patient's urgency incontinence over the last 2 years.  He has had a stroke and utilizes a cane and walks slowly.  No stress incontinence or bedwetting.  He wears 2 pads a day that can be damp or soaked.  Patient said his flow was reasonable and he feels empty  Reassess in 6 weeks with a residual on Flomax.  Patient aware there is many ways of helping him.  Mild elevated residual of 115 may be false positive.  Today Frequency stable.  Urgency much better.  Urge incontinence improved.  Very pleased.  Clinically not infected   PMH: Past Medical History:  Diagnosis Date  . Depression   . Diabetes mellitus without complication ([place])   . Hydrocephalus in adult [facility])   . Hyperlipidemia   . Hypertension   . Seizures ([place])   . Stroke [facility])     Surgical History: Past Surgical History:  Procedure Laterality Date  . COLONOSCOPY WITH PROPOFOL N/A 03/08/2020   Procedure: COLONOSCOPY WITH PROPOFOL;  Surgeon: Virgel Manifold, MD;  Location: [facility] ENDOSCOPY;  Service: Endoscopy;  Laterality: N/A;  . LOOP RECORDER IMPLANT  12/20/13   MDT LinQ implanted by Dr Lovena Le for cryptogenic stroke  . LOOP RECORDER IMPLANT N/A 12/20/2013   Procedure: LOOP RECORDER IMPLANT;  Surgeon: Evans Lance, MD;  Location: [facility] CATH LAB;  Service: Cardiovascular;  Laterality: N/A;    Home Medications:  Allergies as of 10/02/2020   No Known Allergies     Medication List       Accurate as of October 02, 2020  9:24 AM. If you have any questions, ask your nurse or doctor.        accu-chek softclix lancets Use as instructed   amLODipine 10 MG tablet Commonly known as: NORVASC Take 1 tablet (10 mg total) by mouth daily.   Aspir-Low 81 MG EC tablet Generic drug: aspirin TAKE 1 TABLET BY MOUTH ONCE  DAILY   atorvastatin 80 MG tablet Commonly known as: LIPITOR TAKE 1 TABLET BY MOUTH ONCE DAILY AT 6PM   Blood Pressure Monitor Automat Devi Use to monitor blood pressure once daily.   carvedilol 6.25 MG tablet Commonly known as: COREG TAKE 1 TABLET BY MOUTH TWICE DAILY WITH A MEAL   citalopram 10 MG tablet Commonly known as: CELEXA Take 1 tablet (10 mg total) by mouth daily.   clopidogrel 75 MG tablet Commonly known as: PLAVIX TAKE 1 TABLET BY MOUTH ONCE DAILY WITH BREAKFAST   donepezil 5 MG tablet Commonly known as: ARICEPT Take 1 tablet by mouth daily.   gabapentin 300 MG capsule Commonly known as: NEURONTIN Take 1 capsule (300 mg total) by mouth 3 (three) times daily.   lisinopril 5 MG tablet Commonly known as: ZESTRIL Take 1 tablet (5 mg total) by mouth daily.   metFORMIN 500 MG 24 hr tablet Commonly known as: GLUCOPHAGE-XR Take 2 tablets (1,000 mg total) by mouth in the morning and at bedtime.   Na Sulfate-K Sulfate-Mg Sulf 17.5-3.13-1.6 GM/177ML Soln At 5 PM the day before procedure take 1 bottle and 5 hours before procedure take 1 bottle.   OneTouch Verio Flex System w/Device Kit Use to check blood sugar at least 2-3 times a day, in morning fasting and then 2 hours after  a meal.   OneTouch Verio test strip Generic drug: glucose blood Use to check blood sugar at least 2-3 times a day, in morning fasting and 2 hours after a meal.   tamsulosin 0.4 MG Caps capsule Commonly known as: FLOMAX Take 0.4 mg by mouth daily.       Allergies: No Known Allergies  Family History: Family History  Problem Relation Age of Onset  . Stroke Father   . Heart disease Father   . Hypertension Father   . Hypertension Mother   . Diabetes Mother   . Arthritis Brother   . Diabetes Maternal Grandfather     Social History:  reports that he has quit smoking. He has never used smokeless tobacco. He reports current alcohol use of about 9.0 standard drinks of alcohol per week.  He reports that he does not use drugs.  ROS:                                        Physical Exam: There were no vitals taken for this visit.    Laboratory Data: Lab Results  Component Value Date   WBC 4.2 07/13/2020   HGB 13.6 07/13/2020   HCT 41.4 07/13/2020   MCV 94.1 07/13/2020   PLT 205 07/13/2020    Lab Results  Component Value Date   CREATININE 0.97 07/18/2020    No results found for: PSA  No results found for: TESTOSTERONE  Lab Results  Component Value Date   HGBA1C 6.1 07/18/2020    Urinalysis    Component Value Date/Time   COLORURINE YELLOW (A) 07/13/2020 1313   APPEARANCEUR Clear 08/21/2020 0924   LABSPEC 1.018 07/13/2020 1313   LABSPEC 1.010 12/14/2013 0959   PHURINE 7.0 07/13/2020 1313   GLUCOSEU Negative 08/21/2020 0924   GLUCOSEU Negative 12/14/2013 0959   HGBUR NEGATIVE 07/13/2020 1313   BILIRUBINUR Negative 08/21/2020 0924   BILIRUBINUR Negative 12/14/2013 [place] 07/13/2020 1313   PROTEINUR Negative 08/21/2020 0924   PROTEINUR 100 (A) 07/13/2020 1313   UROBILINOGEN 1.0 12/22/2013 1648   NITRITE Negative 08/21/2020 0924   NITRITE NEGATIVE 07/13/2020 1313   LEUKOCYTESUR Negative 08/21/2020 0924   LEUKOCYTESUR NEGATIVE 07/13/2020 1313   LEUKOCYTESUR Negative 12/14/2013 0959    Pertinent Imaging:   Assessment & Plan: Reassess durability in 4 months  There are no diagnoses linked to this encounter.  No follow-ups on file.  Reece Packer, MD  [facility] [address], [place], [place] [address] [phone]

## 2020-10-05 ENCOUNTER — Telehealth: Payer: Self-pay

## 2020-10-05 NOTE — Telephone Encounter (Signed)
Prescription signed and faxed over to [facility].

## 2020-10-09 ENCOUNTER — Encounter: Payer: Self-pay | Admitting: Urology

## 2020-10-09 ENCOUNTER — Ambulatory Visit (INDEPENDENT_AMBULATORY_CARE_PROVIDER_SITE_OTHER): Payer: Medicare Other | Admitting: Urology

## 2020-10-09 VITALS — BP 128/82 | HR 82

## 2020-10-09 DIAGNOSIS — I639 Cerebral infarction, unspecified: Secondary | ICD-10-CM

## 2020-10-09 DIAGNOSIS — R32 Unspecified urinary incontinence: Secondary | ICD-10-CM

## 2020-10-09 DIAGNOSIS — N3946 Mixed incontinence: Secondary | ICD-10-CM

## 2020-10-09 MED ORDER — MIRABEGRON ER 50 MG PO TB24: 50.0000 mg | ORAL_TABLET | Freq: Every day | ORAL | 11 refills | Status: DC

## 2020-10-09 NOTE — Progress Notes (Signed)
10/09/2020 9:50 AM   [name] August 26, 1970 [id number]  Referring provider: Venita Lick, NP [address] [place],  [place] [address]  Chief Complaint  Patient presents with  . Urinary Incontinence    HPI: I was consulted to assist the patient's urgency incontinence over the last 2 years. He has had a stroke and utilizes a cane and walks slowly. No stress incontinence or bedwetting. He wears 2 pads a day that can be damp or soaked.  Patient said his flow was reasonable and he feels empty  Reassess in 6 weeks with a residual on Flomax. Patient aware there is many ways of helping him. Mild elevated residual of 115 may be false positive.  Today Frequency stable.  Urgency much better.  Urge incontinence improved.  Very pleased.  Clinically not infected  TOday Patient came in today 1 week later now with urge incontinence.  Urine might be strong smelling.  He could not leave a sample but his family or friend will bring back one today and will send it for culture.  I will add Myrbetriq.  They were asking about a condom catheter eventually and it may be needed.  Hopefully we can help him with L.   PMH: Past Medical History:  Diagnosis Date  . Depression   . Diabetes mellitus without complication ([place])   . Hydrocephalus in adult [facility])   . Hyperlipidemia   . Hypertension   . Seizures ([place])   . Stroke [facility])     Surgical History: Past Surgical History:  Procedure Laterality Date  . COLONOSCOPY WITH PROPOFOL N/A 03/08/2020   Procedure: COLONOSCOPY WITH PROPOFOL;  Surgeon: Virgel Manifold, MD;  Location: [facility] ENDOSCOPY;  Service: Endoscopy;  Laterality: N/A;  . LOOP RECORDER IMPLANT  12/20/13   MDT LinQ implanted by Dr Lovena Le for cryptogenic stroke  . LOOP RECORDER IMPLANT N/A 12/20/2013   Procedure: LOOP RECORDER IMPLANT;  Surgeon: Evans Lance, MD;  Location: [facility] CATH LAB;  Service: Cardiovascular;  Laterality: N/A;    Home Medications:  Allergies as of 10/09/2020    No Known Allergies     Medication List       Accurate as of October 09, 2020  9:50 AM. If you have any questions, ask your nurse or doctor.        accu-chek softclix lancets Use as instructed   amLODipine 10 MG tablet Commonly known as: NORVASC Take 1 tablet (10 mg total) by mouth daily.   Aspir-Low 81 MG EC tablet Generic drug: aspirin TAKE 1 TABLET BY MOUTH ONCE DAILY   atorvastatin 80 MG tablet Commonly known as: LIPITOR TAKE 1 TABLET BY MOUTH ONCE DAILY AT 6PM   Blood Pressure Monitor Automat Devi Use to monitor blood pressure once daily.   carvedilol 6.25 MG tablet Commonly known as: COREG TAKE 1 TABLET BY MOUTH TWICE DAILY WITH A MEAL   citalopram 10 MG tablet Commonly known as: CELEXA Take 1 tablet (10 mg total) by mouth daily.   clopidogrel 75 MG tablet Commonly known as: PLAVIX TAKE 1 TABLET BY MOUTH ONCE DAILY WITH BREAKFAST   donepezil 5 MG tablet Commonly known as: ARICEPT Take 1 tablet by mouth daily.   gabapentin 300 MG capsule Commonly known as: NEURONTIN Take 1 capsule (300 mg total) by mouth 3 (three) times daily.   lisinopril 5 MG tablet Commonly known as: ZESTRIL Take 1 tablet (5 mg total) by mouth daily.   metFORMIN 500 MG 24 hr tablet Commonly known  as: GLUCOPHAGE-XR Take 2 tablets (1,000 mg total) by mouth in the morning and at bedtime.   Na Sulfate-K Sulfate-Mg Sulf 17.5-3.13-1.6 GM/177ML Soln At 5 PM the day before procedure take 1 bottle and 5 hours before procedure take 1 bottle.   OneTouch Verio Flex System w/Device Kit Use to check blood sugar at least 2-3 times a day, in morning fasting and then 2 hours after a meal.   OneTouch Verio test strip Generic drug: glucose blood Use to check blood sugar at least 2-3 times a day, in morning fasting and 2 hours after a meal.   tamsulosin 0.4 MG Caps capsule Commonly known as: FLOMAX Take 0.4 mg by mouth daily.       Allergies: No Known Allergies  Family History: Family  History  Problem Relation Age of Onset  . Stroke Father   . Heart disease Father   . Hypertension Father   . Hypertension Mother   . Diabetes Mother   . Arthritis Brother   . Diabetes Maternal Grandfather     Social History:  reports that he has quit smoking. He has never used smokeless tobacco. He reports current alcohol use of about 9.0 standard drinks of alcohol per week. He reports that he does not use drugs.  ROS:                                        Physical Exam: There were no vitals taken for this visit.  Constitutional:  Alert and oriented, No acute distress.  Laboratory Data: Lab Results  Component Value Date   WBC 4.2 07/13/2020   HGB 13.6 07/13/2020   HCT 41.4 07/13/2020   MCV 94.1 07/13/2020   PLT 205 07/13/2020    Lab Results  Component Value Date   CREATININE 0.97 07/18/2020    No results found for: PSA  No results found for: TESTOSTERONE  Lab Results  Component Value Date   HGBA1C 6.1 07/18/2020    Urinalysis    Component Value Date/Time   COLORURINE YELLOW (A) 07/13/2020 1313   APPEARANCEUR Clear 08/21/2020 0924   LABSPEC 1.018 07/13/2020 1313   LABSPEC 1.010 12/14/2013 0959   PHURINE 7.0 07/13/2020 1313   GLUCOSEU Negative 08/21/2020 0924   GLUCOSEU Negative 12/14/2013 0959   HGBUR NEGATIVE 07/13/2020 1313   BILIRUBINUR Negative 08/21/2020 0924   BILIRUBINUR Negative 12/14/2013 0959   KETONESUR NEGATIVE 07/13/2020 1313   PROTEINUR Negative 08/21/2020 0924   PROTEINUR 100 (A) 07/13/2020 1313   UROBILINOGEN 1.0 12/22/2013 1648   NITRITE Negative 08/21/2020 0924   NITRITE NEGATIVE 07/13/2020 1313   LEUKOCYTESUR Negative 08/21/2020 0924   LEUKOCYTESUR NEGATIVE 07/13/2020 1313   LEUKOCYTESUR Negative 12/14/2013 0959    Pertinent Imaging:   Assessment & Plan: Reassess in 6 weeks on Myrbetriq 50 mg samples and prescription and Flomax.  Patient triggering is going from sitting to standing position.  At patient  request I gave a box of condom catheters with the prescription.  Hoping to get him nearly dry as well  There are no diagnoses linked to this encounter.  No follow-ups on file.  Reece Packer, MD  [facility] [address], [place], [place] [address] [phone]

## 2020-10-09 NOTE — Patient Instructions (Signed)
Patient will call to figure out condom catheter size to order correct size.

## 2020-10-10 LAB — URINALYSIS, COMPLETE
Bilirubin, UA: NEGATIVE
Glucose, UA: NEGATIVE
Ketones, UA: NEGATIVE
Leukocytes,UA: NEGATIVE
Nitrite, UA: NEGATIVE
Protein,UA: NEGATIVE
RBC, UA: NEGATIVE
Specific Gravity, UA: 1.01 (ref 1.005–1.030)
Urobilinogen, Ur: 1 mg/dL (ref 0.2–1.0)
pH, UA: 6 (ref 5.0–7.5)

## 2020-10-10 LAB — MICROSCOPIC EXAMINATION
Epithelial Cells (non renal): NONE SEEN /hpf (ref 0–10)
RBC, Urine: NONE SEEN /hpf (ref 0–2)

## 2020-10-16 ENCOUNTER — Telehealth: Payer: Self-pay | Admitting: *Deleted

## 2020-10-16 LAB — CULTURE, URINE COMPREHENSIVE

## 2020-10-16 MED ORDER — SULFAMETHOXAZOLE-TRIMETHOPRIM 800-160 MG PO TABS: 1.0000 | ORAL_TABLET | Freq: Two times a day (BID) | ORAL | 0 refills | Status: DC

## 2020-10-16 NOTE — Telephone Encounter (Addendum)
Left patient a VM, sent in RX to [facility].   ----- Message from Veneta Penton, CMA sent at 10/16/2020  3:05 PM EDT -----  ----- Message ----- From: Levada Schilling, CMA Sent: 10/16/2020   2:50 PM EDT To: Veneta Penton, CMA   ----- Message ----- From: Alfredo Martinez, MD Sent: 10/16/2020   2:45 PM EDT To: Levada Schilling, CMA   Septra DS one bid for 7 days    ----- Message ----- From: Levada Schilling, CMA Sent: 10/16/2020   1:41 PM EDT To: Alfredo Martinez, MD   ----- Message ----- From: Interface, [facility] Lab Results In Sent: 10/10/2020   5:38 AM EDT To: Jennette Kettle Clinical

## 2020-10-17 ENCOUNTER — Telehealth: Payer: Self-pay

## 2020-10-17 NOTE — Telephone Encounter (Signed)
Copied from CRM [id number]. Topic: General - Call Back - No Documentation >> Oct 17, 2020  3:25 PM Randol Kern wrote: There was an amendment to the office notes on 09/06/2020 and that is why Medicaid denied the claim for the patient's power wheelchair. Please advise  Requesting a call back from someone that can assist on resolving this  Best [phone] Erie Noe, [employer]

## 2020-10-18 ENCOUNTER — Encounter: Payer: Self-pay | Admitting: Nurse Practitioner

## 2020-10-18 ENCOUNTER — Telehealth: Payer: Self-pay

## 2020-10-18 NOTE — Telephone Encounter (Signed)
Noted, will view on return to office.

## 2020-10-18 NOTE — Telephone Encounter (Signed)
Called and spoke with Dr.Coble, office note placed in your folder please see me and I will let you know where to sign it at.

## 2020-10-18 NOTE — Telephone Encounter (Signed)
On 09/29/20 the supply center had sent forms asking me to addend note to state power wheelchair and not POV, so I performed this action.  Can you see if this means I need to perform another office visit with patient please?  Ask medical supply.  If so I believe he may be scheduled upcoming and can add then.

## 2020-10-18 NOTE — Telephone Encounter (Signed)
Routing to provider  

## 2020-10-18 NOTE — Telephone Encounter (Signed)
Will need to call back around 12:30 and speak with Dr.Coble to see what is needed.

## 2020-10-18 NOTE — Telephone Encounter (Signed)
Lvm to r/s 4/14 appt

## 2020-10-18 NOTE — Telephone Encounter (Signed)
-----   Message from Levada Schilling, [place] sent at 10/16/2020  2:50 PM EDT -----  ----- Message ----- From: Alfredo Martinez, MD Sent: 10/16/2020   2:45 PM EDT To: Levada Schilling, CMA   Septra DS one bid for 7 days    ----- Message ----- From: Levada Schilling, CMA Sent: 10/16/2020   1:41 PM EDT To: Alfredo Martinez, MD   ----- Message ----- From: Interface, [facility] Lab Results In Sent: 10/10/2020   5:38 AM EDT To: Jennette Kettle Clinical

## 2020-10-19 ENCOUNTER — Ambulatory Visit: Payer: Medicare Other | Admitting: Nurse Practitioner

## 2020-10-26 ENCOUNTER — Telehealth: Payer: Self-pay

## 2020-10-26 NOTE — Telephone Encounter (Signed)
Copied from CRM [id number]. Topic: General - Call Back - No Documentation >> Oct 17, 2020  3:25 PM Randol Kern wrote: There was an amendment to the office notes on 09/06/2020 and that is why Medicaid denied the claim for the patient's power wheelchair. Please advise  Requesting a call back from someone that can assist on resolving this  Best [phone] Erie Noe, [employer] >> Oct 26, 2020  1:56 PM Tamela Oddi wrote: Mr. [name] is calling to get an update on a request that was sent a few weeks ago for a portable commode and wheelchair.  He stated he has not heard anything yet.  Please advise and call to discuss at [phone]

## 2020-10-26 NOTE — Telephone Encounter (Signed)
Replied via mychart.

## 2020-10-30 ENCOUNTER — Ambulatory Visit: Payer: Medicare Other | Admitting: Nurse Practitioner

## 2020-10-30 NOTE — Telephone Encounter (Signed)
There is a couple places that need to be signed, I can come talk to you when ready.

## 2020-10-30 NOTE — Telephone Encounter (Signed)
Have forms in my office, Tiffany what else do we need to add for these?

## 2020-10-30 NOTE — Telephone Encounter (Signed)
Completed forms

## 2020-10-31 NOTE — Telephone Encounter (Signed)
Prescription written and ready for signature

## 2020-11-02 ENCOUNTER — Telehealth: Payer: Self-pay

## 2020-11-14 ENCOUNTER — Ambulatory Visit (INDEPENDENT_AMBULATORY_CARE_PROVIDER_SITE_OTHER): Payer: Medicare Other | Admitting: Nurse Practitioner

## 2020-11-14 ENCOUNTER — Encounter: Payer: Self-pay | Admitting: Nurse Practitioner

## 2020-11-14 ENCOUNTER — Other Ambulatory Visit: Payer: Self-pay

## 2020-11-14 VITALS — BP 120/80 | HR 69 | Temp 98.8°F | Wt 255.8 lb

## 2020-11-14 DIAGNOSIS — E1129 Type 2 diabetes mellitus with other diabetic kidney complication: Secondary | ICD-10-CM

## 2020-11-14 DIAGNOSIS — Z6833 Body mass index (BMI) 33.0-33.9, adult: Secondary | ICD-10-CM

## 2020-11-14 DIAGNOSIS — E1169 Type 2 diabetes mellitus with other specified complication: Secondary | ICD-10-CM

## 2020-11-14 DIAGNOSIS — I152 Hypertension secondary to endocrine disorders: Secondary | ICD-10-CM

## 2020-11-14 DIAGNOSIS — E785 Hyperlipidemia, unspecified: Secondary | ICD-10-CM

## 2020-11-14 DIAGNOSIS — E6609 Other obesity due to excess calories: Secondary | ICD-10-CM

## 2020-11-14 DIAGNOSIS — E1159 Type 2 diabetes mellitus with other circulatory complications: Secondary | ICD-10-CM

## 2020-11-14 DIAGNOSIS — I639 Cerebral infarction, unspecified: Secondary | ICD-10-CM

## 2020-11-14 DIAGNOSIS — R809 Proteinuria, unspecified: Secondary | ICD-10-CM

## 2020-11-14 DIAGNOSIS — E1142 Type 2 diabetes mellitus with diabetic polyneuropathy: Secondary | ICD-10-CM | POA: Diagnosis not present

## 2020-11-14 DIAGNOSIS — E538 Deficiency of other specified B group vitamins: Secondary | ICD-10-CM

## 2020-11-14 LAB — BAYER DCA HB A1C WAIVED: HB A1C (BAYER DCA - WAIVED): 5.5 % (ref <7.0)

## 2020-11-14 NOTE — Assessment & Plan Note (Addendum)
Chronic, ongoing.  A1C at goal today at 5.5% & urine ALB 150 last visit, A1c trending down from previous. Decrease Metformin XR to 500 MG BID, wish to avoid hypoglycemia which he has experienced in past.  May be able to reduce further if remains stable.  Due to history of hypoglycemia, which he has had in past, will avoid restart of Glipizide at this time and if elevation could consider SGLT or GLP add on.  Recommend he focus on diet, as still tends to snack a lot at home, and avoid alcohol use. Continue checking BS daily at home.  Continue Lisinopril for kidney protection.  Return in 3 months.

## 2020-11-14 NOTE — Assessment & Plan Note (Signed)
Chronic, ongoing.  Continue current medication regimen and adjust as needed.  Lipid panel done last visit with LDL 54.  Refills sent in last visit. Return in 3 months.

## 2020-11-14 NOTE — Assessment & Plan Note (Signed)
Chronic, ongoing.  BP at goal today.  Continue current medication regimen and adjust as needed at next visit.  Recommend checking BP daily at home and documenting for provider.  He has a BP cuff available at his home. CMP and TSH up to date.  Refills sent last visit.  Return in 3 months for f/u.

## 2020-11-14 NOTE — Assessment & Plan Note (Signed)
Chronic, stable on recent check.  Continue daily supplement which is benefiting neuropathy.

## 2020-11-14 NOTE — Assessment & Plan Note (Signed)
Chronic, ongoing.  A1C 5.5% today.  Decrease Metformin XR to 500 MG BID, he denies GI issues.  Due to history of hypoglycemia, which he has had in past, will avoid restart of Glipizide at this time and if elevation consider SGLT or GLP add on. Continue Gabapentin for discomfort. Recommend he focus on diet, as still tends to snack a lot at home, and avoid alcohol use. Continue checking BS daily at home.  Return in 3 months.

## 2020-11-14 NOTE — Progress Notes (Signed)
BP 120/80   Pulse 69   Temp 98.8 F (37.1 C) (Oral)   Wt 255 lb 12.8 oz (116 kg)   SpO2 98%   BMI 33.75 kg/m    Subjective:    Patient ID: [name], male    DOB: March 07, 1971, 50 y.o.   MRN: [id number]  HPI: [name] is a 50 y.o. male  Chief Complaint  Patient presents with  . Diabetes    Patient's brother stating patient readings have been doing well.    DIABETES Continues on Metformin XR 1000 MG BID with last A1C 6.1% in Januray 2022 with urine ALB 150.  Endorses intermittent poor diet choices.  He reports drinking about 2-4 beers a day and liquor on weekends, cutting back.  History of B12 deficiency with last level improved with supplement.  Does reports a reduced appetite/cutting back on some things, has lost 25 pounds since March.  Non smoker -- quit after high school.  Denies night sweats, fever, SOB, CP, N&V, blood in stool, or abdominal pain.   Hypoglycemic episodes:no Polydipsia/polyuria: no Visual disturbance: no Chest pain: no Paresthesias: no Glucose Monitoring: yes             Accucheck frequency: daily             Fasting glucose: 123 today             Post prandial:             Evening:             Before meals: Taking Insulin?: no             Long acting insulin:             Short acting insulin: Blood Pressure Monitoring: daily Retinal Examination: Not up to Date Foot Exam: Up to Date Pneumovax: Up to Date Influenza: up to date Aspirin: yes   HYPERTENSION / HYPERLIPIDEMIA Continues on Amlodipine 10 MG, ASA, Plavix, Carvedilol 6.25 MG BID, Lisinopril 5 MG QDAY, and Lipitor 80 MG daily. Satisfied with current treatment? yes Duration of hypertension: chronic BP monitoring frequency: not checking BP range: not checking BP medication side effects: no Duration of hyperlipidemia: chronic Cholesterol medication side effects: no Cholesterol supplements: none Medication compliance: good compliance Aspirin: yes Recent stressors: no Recurrent  headaches: no Visual changes: no Palpitations: no Dyspnea: no Chest pain: no Lower extremity edema: no Dizzy/lightheaded: no    Relevant past medical, surgical, family and social history reviewed and updated as indicated. Interim medical history since our last visit reviewed. Allergies and medications reviewed and updated.  Review of Systems  Constitutional: Negative for activity change, diaphoresis, fatigue and fever.  Respiratory: Negative for cough, chest tightness, shortness of breath and wheezing.   Cardiovascular: Negative for chest pain, palpitations and leg swelling.  Gastrointestinal: Negative for abdominal distention, abdominal pain, constipation, diarrhea, nausea and vomiting.  Endocrine: Negative for cold intolerance, heat intolerance, polydipsia, polyphagia and polyuria.  Neurological: Negative for dizziness, syncope, weakness, light-headedness, numbness and headaches.  Psychiatric/Behavioral: Negative.     Per HPI unless specifically indicated above     Objective:    BP 120/80   Pulse 69   Temp 98.8 F (37.1 C) (Oral)   Wt 255 lb 12.8 oz (116 kg)   SpO2 98%   BMI 33.75 kg/m   Wt Readings from Last 3 Encounters:  11/14/20 255 lb 12.8 oz (116 kg)  09/07/20 269 lb 2 oz (122.1 kg)  09/06/20 280  lb (127 kg)    Physical Exam Vitals and nursing note reviewed.  Constitutional:      General: He is awake. He is not in acute distress.    Appearance: He is well-developed. He is obese. He is not ill-appearing.  HENT:     Head: Normocephalic and atraumatic.     Right Ear: Hearing normal. No drainage.     Left Ear: Hearing normal. No drainage.  Eyes:     General: Lids are normal.        Right eye: No discharge.        Left eye: No discharge.     Conjunctiva/sclera: Conjunctivae normal.     Pupils: Pupils are equal, round, and reactive to light.  Neck:     Thyroid: No thyromegaly.     Vascular: No carotid bruit.  Cardiovascular:     Rate and Rhythm: Normal  rate and regular rhythm.     Heart sounds: Normal heart sounds, S1 normal and S2 normal. No murmur heard. No gallop.   Pulmonary:     Effort: Pulmonary effort is normal. No accessory muscle usage or respiratory distress.     Breath sounds: Normal breath sounds.  Abdominal:     General: Bowel sounds are normal.     Palpations: Abdomen is soft.  Musculoskeletal:        General: Normal range of motion.     Cervical back: Normal range of motion and neck supple.     Right lower leg: No edema.     Left lower leg: No edema.     Comments: Utilizing walker today, although periods of unsteady on feet with this.  Skin:    General: Skin is warm and dry.     Capillary Refill: Capillary refill takes less than 2 seconds.  Neurological:     Mental Status: He is alert and oriented to person, place, and time.  Psychiatric:        Attention and Perception: Attention normal.        Mood and Affect: Mood normal.        Speech: Speech normal.        Behavior: Behavior normal. Behavior is cooperative.     Results for orders placed or performed in visit on 10/09/20  CULTURE, URINE COMPREHENSIVE   Specimen: Urine   UR  Result Value Ref Range   Urine Culture, Comprehensive Final report (A)    Organism ID, Bacteria Comment (A)    ANTIMICROBIAL SUSCEPTIBILITY Comment   Microscopic Examination   Urine  Result Value Ref Range   WBC, UA 0-5 0 - 5 /hpf   RBC None seen 0 - 2 /hpf   Epithelial Cells (non renal) None seen 0 - 10 /hpf   Bacteria, UA Few None seen/Few   Yeast, UA Present (A) None seen  Urinalysis, Complete  Result Value Ref Range   Specific Gravity, UA 1.010 1.005 - 1.030   pH, UA 6.0 5.0 - 7.5   Color, UA Yellow Yellow   Appearance Ur Clear Clear   Leukocytes,UA Negative Negative   Protein,UA Negative Negative/Trace   Glucose, UA Negative Negative   Ketones, UA Negative Negative   RBC, UA Negative Negative   Bilirubin, UA Negative Negative   Urobilinogen, Ur 1.0 0.2 - 1.0 mg/dL    Nitrite, UA Negative Negative   Microscopic Examination See below:       Assessment & Plan:   Problem List Items Addressed This Visit  Cardiovascular and Mediastinum   Hypertension associated with diabetes (HCC)    Chronic, ongoing.  BP at goal today.  Continue current medication regimen and adjust as needed at next visit.  Recommend checking BP daily at home and documenting for provider.  He has a BP cuff available at his home. CMP and TSH up to date.  Refills sent last visit.  Return in 3 months for f/u.        Endocrine   Hyperlipidemia associated with type 2 diabetes mellitus (HCC)    Chronic, ongoing.  Continue current medication regimen and adjust as needed.  Lipid panel done last visit with LDL 54.  Refills sent in last visit. Return in 3 months.      Type 2 diabetes mellitus with diabetic polyneuropathy (HCC)    Chronic, ongoing.  A1C 5.5% today.  Decrease Metformin XR to 500 MG BID, he denies GI issues.  Due to history of hypoglycemia, which he has had in past, will avoid restart of Glipizide at this time and if elevation consider SGLT or GLP add on. Continue Gabapentin for discomfort. Recommend he focus on diet, as still tends to snack a lot at home, and avoid alcohol use. Continue checking BS daily at home.  Return in 3 months.      Relevant Orders   Bayer DCA Hb A1c Waived   Type 2 diabetes mellitus with proteinuria (HCC) - Primary    Chronic, ongoing.  A1C at goal today at 5.5% & urine ALB 150 last visit, A1c trending down from previous. Decrease Metformin XR to 500 MG BID, wish to avoid hypoglycemia which he has experienced in past.  May be able to reduce further if remains stable.  Due to history of hypoglycemia, which he has had in past, will avoid restart of Glipizide at this time and if elevation could consider SGLT or GLP add on.  Recommend he focus on diet, as still tends to snack a lot at home, and avoid alcohol use. Continue checking BS daily at home.  Continue  Lisinopril for kidney protection.  Return in 3 months.        Other   Obesity    BMI 33.75 with T2DM, HTN.  Recommended eating smaller high protein, low fat meals more frequently and exercising 30 mins a day 5 times a week with a goal of 10-15lb weight loss in the next 3 months. Patient voiced their understanding and motivation to adhere to these recommendations.  Will bring back in 4 weeks to recheck weight as concerned with quick loss -- recommend continue cut back on alcohol -- if ongoing loss will obtain labs and imaging next visit + consider return to GI.       Vitamin B12 deficiency    Chronic, stable on recent check.  Continue daily supplement which is benefiting neuropathy.          Follow up plan: Return in about 4 weeks (around 12/12/2020) for WEIGHT CHECK.

## 2020-11-14 NOTE — Patient Instructions (Addendum)
CUT BACK TO METFORMIN one tablet in the morning and one tablet in the evening.   Diabetes Mellitus and Exercise Exercising regularly is important for overall health, especially for people who have diabetes mellitus. Exercising is not only about losing weight. It has many other health benefits, such as increasing muscle strength and bone density and reducing body fat and stress. This leads to improved fitness, flexibility, and endurance, all of which result in better overall health. What are the benefits of exercise if I have diabetes? Exercise has many benefits for people with diabetes. They include:  Helping to lower and control blood sugar (glucose).  Helping the body to respond better to the hormone insulin by improving insulin sensitivity.  Reducing how much insulin the body needs.  Lowering the risk for heart disease by: ? Lowering "bad" cholesterol and triglyceride levels. ? Increasing "good" cholesterol levels. ? Lowering blood pressure. ? Lowering blood glucose levels. What is my activity plan? Your health care provider or certified diabetes educator can help you make a plan for the type and frequency of exercise that works for you. This is called your activity plan. Be sure to:  Get at least 150 minutes of medium-intensity or high-intensity exercise each week. Exercises may include brisk walking, biking, or water aerobics.  Do stretching and strengthening exercises, such as yoga or weight lifting, at least 2 times a week.  Spread out your activity over at least 3 days of the week.  Get some form of physical activity each day. ? Do not go more than 2 days in a row without some kind of physical activity. ? Avoid being inactive for more than 90 minutes at a time. Take frequent breaks to walk or stretch.  Choose exercises or activities that you enjoy. Set realistic goals.  Start slowly and gradually increase your exercise intensity over time.   How do I manage my diabetes  during exercise? Monitor your blood glucose  Check your blood glucose before and after exercising. If your blood glucose is: ? 240 mg/dL (73.4 mmol/L) or higher before you exercise, check your urine for ketones. These are chemicals created by the liver. If you have ketones in your urine, do not exercise until your blood glucose returns to normal. ? 100 mg/dL (5.6 mmol/L) or lower, eat a snack containing 15-20 grams of carbohydrate. Check your blood glucose 15 minutes after the snack to make sure that your glucose level is above 100 mg/dL (5.6 mmol/L) before you start your exercise.  Know the symptoms of low blood glucose (hypoglycemia) and how to treat it. Your risk for hypoglycemia increases during and after exercise. Follow these tips and your health care provider's instructions  Keep a carbohydrate snack that is fast-acting for use before, during, and after exercise to help prevent or treat hypoglycemia.  Avoid injecting insulin into areas of the body that are going to be exercised. For example, avoid injecting insulin into: ? Your arms, when you are about to play tennis. ? Your legs, when you are about to go jogging.  Keep records of your exercise habits. Doing this can help you and your health care provider adjust your diabetes management plan as needed. Write down: ? Food that you eat before and after you exercise. ? Blood glucose levels before and after you exercise. ? The type and amount of exercise you have done.  Work with your health care provider when you start a new exercise or activity. He or she may need to: ? Make  sure that the activity is safe for you. ? Adjust your insulin, other medicines, and food that you eat.  Drink plenty of water while you exercise. This prevents loss of water (dehydration) and problems caused by a lot of heat in the body (heat stroke).   Where to find more information  American Diabetes Association: www.diabetes.org Summary  Exercising  regularly is important for overall health, especially for people who have diabetes mellitus.  Exercising has many health benefits. It increases muscle strength and bone density and reduces body fat and stress. It also lowers and controls blood glucose.  Your health care provider or certified diabetes educator can help you make an activity plan for the type and frequency of exercise that works for you.  Work with your health care provider to make sure any new activity is safe for you. Also work with your health care provider to adjust your insulin, other medicines, and the food you eat. This information is not intended to replace advice given to you by your health care provider. Make sure you discuss any questions you have with your health care provider. Document Revised: 03/22/2019 Document Reviewed: 03/22/2019 Elsevier Patient Education  2021 [employer].

## 2020-11-14 NOTE — Assessment & Plan Note (Addendum)
BMI 33.75 with T2DM, HTN.  Recommended eating smaller high protein, low fat meals more frequently and exercising 30 mins a day 5 times a week with a goal of 10-15lb weight loss in the next 3 months. Patient voiced their understanding and motivation to adhere to these recommendations.  Will bring back in 4 weeks to recheck weight as concerned with quick loss -- recommend continue cut back on alcohol -- if ongoing loss will obtain labs and imaging next visit + consider return to GI.

## 2020-11-17 ENCOUNTER — Telehealth: Payer: Self-pay

## 2020-11-30 ENCOUNTER — Telehealth: Payer: Self-pay

## 2020-11-30 NOTE — Telephone Encounter (Signed)
Please reach out to see what is needed.  I have sent all paperwork.

## 2020-11-30 NOTE — Telephone Encounter (Signed)
Reached out to [place] to gather more information about what the patient needs and was unable to leave a message due to the voicemail.

## 2020-11-30 NOTE — Telephone Encounter (Signed)
Copied from CRM [id number]. Topic: General - Other >> Nov 29, 2020  4:13 PM Herby Abraham C wrote: Reason for CRM: Erie Noe is calling in to follow up on pt's letter/ paperwork. She says that she has been receiving assistance from providers assistant. Erie Noe would like a call back to discuss further. She says that she has been working on this a month or so.     [phone] -

## 2020-12-01 ENCOUNTER — Telehealth: Payer: Self-pay

## 2020-12-01 NOTE — Telephone Encounter (Signed)
Copied from CRM [id number]. Topic: General - Other >> Dec 01, 2020 10:46 AM Pawlus, Maxine Glenn A wrote: Reason for CRM: Clare Gandy called on behalf of the pt, requested a call back from the social worker Dickie La, please call back

## 2020-12-01 NOTE — Telephone Encounter (Signed)
Spoke with representative from [employer] to inform them that [name] states she has completed and sent over all requested paperwork. Representative verbalized understanding and states he would let Erie Noe know as she is currently out of the office.

## 2020-12-05 NOTE — Telephone Encounter (Signed)
New note placed in your folder for signature

## 2020-12-11 ENCOUNTER — Telehealth: Payer: Self-pay | Admitting: Licensed Clinical Social Worker

## 2020-12-11 NOTE — Telephone Encounter (Signed)
    Clinical Social Work  Chronic Care Management   Phone Outreach    12/11/2020 Name: [name] MRN: [id number] DOB: 1971/03/30  [name] is a 50 y.o. year old male who is a primary care patient of Cannady, Dorie Rank, NP .   CCM LCSW reached out to patient today by phone to introduce self, assess needs and offer Care Management services and interventions.    Telephone outreach was unsuccessful  Plan:CCM LCSW will wait for return call. If no return call is received, Will reach out to patient again in the next 7 days .   Review of patient status, including review of consultants reports, relevant laboratory and other test results, and collaboration with appropriate care team members and the patient's provider was performed as part of comprehensive patient evaluation and provision of care management services.     Jenel Lucks, MSW, LCSW [facility] Care Management [name]  [facility] [email or web address] Phone [phone] 11:16 AM

## 2020-12-11 NOTE — Telephone Encounter (Signed)
error 

## 2020-12-14 ENCOUNTER — Ambulatory Visit (INDEPENDENT_AMBULATORY_CARE_PROVIDER_SITE_OTHER): Payer: Medicare Other | Admitting: Nurse Practitioner

## 2020-12-14 ENCOUNTER — Ambulatory Visit (INDEPENDENT_AMBULATORY_CARE_PROVIDER_SITE_OTHER): Payer: Medicare Other | Admitting: Licensed Clinical Social Worker

## 2020-12-14 ENCOUNTER — Other Ambulatory Visit: Payer: Self-pay

## 2020-12-14 ENCOUNTER — Encounter: Payer: Self-pay | Admitting: Nurse Practitioner

## 2020-12-14 VITALS — BP 127/86 | HR 69 | Temp 98.5°F | Wt 270.4 lb

## 2020-12-14 DIAGNOSIS — Z8673 Personal history of transient ischemic attack (TIA), and cerebral infarction without residual deficits: Secondary | ICD-10-CM

## 2020-12-14 DIAGNOSIS — E6609 Other obesity due to excess calories: Secondary | ICD-10-CM

## 2020-12-14 DIAGNOSIS — E1142 Type 2 diabetes mellitus with diabetic polyneuropathy: Secondary | ICD-10-CM | POA: Diagnosis not present

## 2020-12-14 DIAGNOSIS — E1129 Type 2 diabetes mellitus with other diabetic kidney complication: Secondary | ICD-10-CM

## 2020-12-14 DIAGNOSIS — I152 Hypertension secondary to endocrine disorders: Secondary | ICD-10-CM

## 2020-12-14 DIAGNOSIS — E1159 Type 2 diabetes mellitus with other circulatory complications: Secondary | ICD-10-CM | POA: Diagnosis not present

## 2020-12-14 DIAGNOSIS — Z7409 Other reduced mobility: Secondary | ICD-10-CM

## 2020-12-14 DIAGNOSIS — Z6833 Body mass index (BMI) 33.0-33.9, adult: Secondary | ICD-10-CM | POA: Diagnosis not present

## 2020-12-14 DIAGNOSIS — R809 Proteinuria, unspecified: Secondary | ICD-10-CM

## 2020-12-14 DIAGNOSIS — I639 Cerebral infarction, unspecified: Secondary | ICD-10-CM

## 2020-12-14 NOTE — Progress Notes (Signed)
BP 127/86   Pulse 69   Temp 98.5 F (36.9 C) (Oral)   Wt 270 lb 6.4 oz (122.7 kg)   SpO2 99%   BMI 35.67 kg/m    Subjective:    Patient ID: [name], male    DOB: 1970-12-24, 50 y.o.   MRN: [id number]  HPI: [name] is a 50 y.o. male  Chief Complaint  Patient presents with   Weight Check   DIABETES Continues on Metformin XR 500 MG BID (reduced recent visit) with last A1C 5.5% in May 2022 with urine ALB 150 in January 2022.  He reports drinking about 2 beers a day and liquor on weekends, cutting back.  Does report a reduced appetite/cutting back on some things, has maintained weight at this time.  Non smoker -- quit after high school.  Denies night sweats, fever, SOB, CP, N&V, blood in stool, or abdominal pain.   Hypoglycemic episodes:no Polydipsia/polyuria: no Visual disturbance: no Chest pain: no Paresthesias: no Glucose Monitoring: yes             Accucheck frequency: daily             Fasting glucose: 118 today             Post prandial:             Evening:             Before meals: Taking Insulin?: no             Long acting insulin:             Short acting insulin: Blood Pressure Monitoring: daily Retinal Examination: Not up to Date Foot Exam: Up to Date Pneumovax: Up to Date Influenza: up to date Aspirin: yes    Relevant past medical, surgical, family and social history reviewed and updated as indicated. Interim medical history since our last visit reviewed. Allergies and medications reviewed and updated.  Review of Systems  Constitutional:  Negative for activity change, diaphoresis, fatigue and fever.  Respiratory:  Negative for cough, chest tightness, shortness of breath and wheezing.   Cardiovascular:  Negative for chest pain, palpitations and leg swelling.  Gastrointestinal: Negative.   Endocrine: Negative for cold intolerance, heat intolerance, polydipsia, polyphagia and polyuria.  Neurological: Negative.   Psychiatric/Behavioral:  Negative.     Per HPI unless specifically indicated above     Objective:    BP 127/86   Pulse 69   Temp 98.5 F (36.9 C) (Oral)   Wt 270 lb 6.4 oz (122.7 kg)   SpO2 99%   BMI 35.67 kg/m   Wt Readings from Last 3 Encounters:  12/14/20 270 lb 6.4 oz (122.7 kg)  11/14/20 255 lb 12.8 oz (116 kg)  09/07/20 269 lb 2 oz (122.1 kg)    Physical Exam Vitals and nursing note reviewed.  Constitutional:      General: He is awake. He is not in acute distress.    Appearance: He is well-developed. He is obese. He is not ill-appearing.  HENT:     Head: Normocephalic and atraumatic.     Right Ear: Hearing normal. No drainage.     Left Ear: Hearing normal. No drainage.  Eyes:     General: Lids are normal.        Right eye: No discharge.        Left eye: No discharge.     Conjunctiva/sclera: Conjunctivae normal.     Pupils: Pupils are equal, round,  and reactive to light.  Neck:     Thyroid: No thyromegaly.     Vascular: No carotid bruit.  Cardiovascular:     Rate and Rhythm: Normal rate and regular rhythm.     Heart sounds: Normal heart sounds, S1 normal and S2 normal. No murmur heard.   No gallop.  Pulmonary:     Effort: Pulmonary effort is normal. No accessory muscle usage or respiratory distress.     Breath sounds: Normal breath sounds.  Abdominal:     General: Bowel sounds are normal.     Palpations: Abdomen is soft.  Musculoskeletal:        General: Normal range of motion.     Cervical back: Normal range of motion and neck supple.     Right lower leg: No edema.     Left lower leg: No edema.     Comments: Utilizing walker today, although periods of unsteady on feet with this.  Skin:    General: Skin is warm and dry.     Capillary Refill: Capillary refill takes less than 2 seconds.  Neurological:     Mental Status: He is alert and oriented to person, place, and time.  Psychiatric:        Attention and Perception: Attention normal.        Mood and Affect: Mood normal.         Speech: Speech normal.        Behavior: Behavior normal. Behavior is cooperative.    Results for orders placed or performed in visit on 11/14/20  Bayer DCA Hb A1c Waived  Result Value Ref Range   HB A1C (BAYER DCA - WAIVED) 5.5 <7.0 %      Assessment & Plan:   Problem List Items Addressed This Visit       Endocrine   Type 2 diabetes mellitus with proteinuria (HCC) - Primary    Chronic, ongoing.  A1C at goal recent visit at 5.5% & urine ALB 150 January 2022, A1c trending down from previous. Continue Metformin XR at reduced dose 500 MG BID, wish to avoid hypoglycemia which he has experienced in past.  May be able to reduce further if remains stable.  Due to history of hypoglycemia, which he has had in past, will avoid restart of Glipizide at this time and if elevation could consider SGLT or GLP add on.  Recommend he focus on diet, as still tends to snack a lot at home, and avoid alcohol use. Continue checking BS daily at home.  Continue Lisinopril for kidney protection.  Return in 2 months.         Other   Obesity    BMI 33.75 with T2DM, HTN.  Recommended eating smaller high protein, low fat meals more frequently and exercising 30 mins a day 5 times a week with a goal of 10-15lb weight loss in the next 3 months. Patient voiced their understanding and motivation to adhere to these recommendations.  Recommend continue cut back on alcohol -- if any abnormal loss will obtain labs and imaging next visit + consider return to GI.  At this time has gained and is maintaining weight -- ?accuracy of recent weight in office.          Follow up plan: Return in about 2 months (around 02/13/2021) for T2DM, HTN/HLD.

## 2020-12-14 NOTE — Patient Instructions (Signed)
Diabetes Mellitus and Nutrition, Adult When you have diabetes, or diabetes mellitus, it is very important to have healthy eating habits because your blood sugar (glucose) levels are greatly affected by what you eat and drink. Eating healthy foods in the right amounts, at about the same times every day, can help you:  Control your blood glucose.  Lower your risk of heart disease.  Improve your blood pressure.  Reach or maintain a healthy weight. What can affect my meal plan? Every person with diabetes is different, and each person has different needs for a meal plan. Your health care provider may recommend that you work with a dietitian to make a meal plan that is best for you. Your meal plan may vary depending on factors such as:  The calories you need.  The medicines you take.  Your weight.  Your blood glucose, blood pressure, and cholesterol levels.  Your activity level.  Other health conditions you have, such as heart or kidney disease. How do carbohydrates affect me? Carbohydrates, also called carbs, affect your blood glucose level more than any other type of food. Eating carbs naturally raises the amount of glucose in your blood. Carb counting is a method for keeping track of how many carbs you eat. Counting carbs is important to keep your blood glucose at a healthy level, especially if you use insulin or take certain oral diabetes medicines. It is important to know how many carbs you can safely have in each meal. This is different for every person. Your dietitian can help you calculate how many carbs you should have at each meal and for each snack. How does alcohol affect me? Alcohol can cause a sudden decrease in blood glucose (hypoglycemia), especially if you use insulin or take certain oral diabetes medicines. Hypoglycemia can be a life-threatening condition. Symptoms of hypoglycemia, such as sleepiness, dizziness, and confusion, are similar to symptoms of having too much  alcohol.  Do not drink alcohol if: ? Your health care provider tells you not to drink. ? You are pregnant, may be pregnant, or are planning to become pregnant.  If you drink alcohol: ? Do not drink on an empty stomach. ? Limit how much you use to:  0-1 drink a day for women.  0-2 drinks a day for men. ? Be aware of how much alcohol is in your drink. In the [place] one drink equals one 12 oz bottle of beer (355 mL), one 5 oz glass of wine (148 mL), or one 1 oz glass of hard liquor (44 mL). ? Keep yourself hydrated with water, diet soda, or unsweetened iced tea.  Keep in mind that regular soda, juice, and other mixers may contain a lot of sugar and must be counted as carbs. What are tips for following this plan? Reading food labels  Start by checking the serving size on the "Nutrition Facts" label of packaged foods and drinks. The amount of calories, carbs, fats, and other nutrients listed on the label is based on one serving of the item. Many items contain more than one serving per package.  Check the total grams (g) of carbs in one serving. You can calculate the number of servings of carbs in one serving by dividing the total carbs by 15. For example, if a food has 30 g of total carbs per serving, it would be equal to 2 servings of carbs.  Check the number of grams (g) of saturated fats and trans fats in one serving. Choose foods that have   a low amount or none of these fats.  Check the number of milligrams (mg) of salt (sodium) in one serving. Most people should limit total sodium intake to less than 2,300 mg per day.  Always check the nutrition information of foods labeled as "low-fat" or "nonfat." These foods may be higher in added sugar or refined carbs and should be avoided.  Talk to your dietitian to identify your daily goals for nutrients listed on the label. Shopping  Avoid buying canned, pre-made, or processed foods. These foods tend to be high in fat, sodium, and added  sugar.  Shop around the outside edge of the grocery store. This is where you will most often find fresh fruits and vegetables, bulk grains, fresh meats, and fresh dairy. Cooking  Use low-heat cooking methods, such as baking, instead of high-heat cooking methods like deep frying.  Cook using healthy oils, such as olive, canola, or sunflower oil.  Avoid cooking with butter, cream, or high-fat meats. Meal planning  Eat meals and snacks regularly, preferably at the same times every day. Avoid going long periods of time without eating.  Eat foods that are high in fiber, such as fresh fruits, vegetables, beans, and whole grains. Talk with your dietitian about how many servings of carbs you can eat at each meal.  Eat 4-6 oz (112-168 g) of lean protein each day, such as lean meat, chicken, fish, eggs, or tofu. One ounce (oz) of lean protein is equal to: ? 1 oz (28 g) of meat, chicken, or fish. ? 1 egg. ?  cup (62 g) of tofu.  Eat some foods each day that contain healthy fats, such as avocado, nuts, seeds, and fish.   What foods should I eat? Fruits Berries. Apples. Oranges. Peaches. Apricots. Plums. Grapes. Mango. Papaya. Pomegranate. Kiwi. Cherries. Vegetables Lettuce. Spinach. Leafy greens, including kale, chard, collard greens, and mustard greens. Beets. Cauliflower. Cabbage. Broccoli. Carrots. Green beans. Tomatoes. Peppers. Onions. Cucumbers. Brussels sprouts. Grains Whole grains, such as whole-wheat or whole-grain bread, crackers, tortillas, cereal, and pasta. Unsweetened oatmeal. Quinoa. Brown or wild rice. Meats and other proteins Seafood. Poultry without skin. Lean cuts of poultry and beef. Tofu. Nuts. Seeds. Dairy Low-fat or fat-free dairy products such as milk, yogurt, and cheese. The items listed above may not be a complete list of foods and beverages you can eat. Contact a dietitian for more information. What foods should I avoid? Fruits Fruits canned with  syrup. Vegetables Canned vegetables. Frozen vegetables with butter or cream sauce. Grains Refined white flour and flour products such as bread, pasta, snack foods, and cereals. Avoid all processed foods. Meats and other proteins Fatty cuts of meat. Poultry with skin. Breaded or fried meats. Processed meat. Avoid saturated fats. Dairy Full-fat yogurt, cheese, or milk. Beverages Sweetened drinks, such as soda or iced tea. The items listed above may not be a complete list of foods and beverages you should avoid. Contact a dietitian for more information. Questions to ask a health care provider  Do I need to meet with a diabetes educator?  Do I need to meet with a dietitian?  What number can I call if I have questions?  When are the best times to check my blood glucose? Where to find more information:  American Diabetes Association: diabetes.org  Academy of Nutrition and Dietetics: www.eatright.org  National Institute of Diabetes and Digestive and Kidney Diseases: www.niddk.nih.gov  Association of Diabetes Care and Education Specialists: www.diabeteseducator.org Summary  It is important to have healthy eating   habits because your blood sugar (glucose) levels are greatly affected by what you eat and drink.  A healthy meal plan will help you control your blood glucose and maintain a healthy lifestyle.  Your health care provider may recommend that you work with a dietitian to make a meal plan that is best for you.  Keep in mind that carbohydrates (carbs) and alcohol have immediate effects on your blood glucose levels. It is important to count carbs and to use alcohol carefully. This information is not intended to replace advice given to you by your health care provider. Make sure you discuss any questions you have with your health care provider. Document Revised: 06/01/2019 Document Reviewed: 06/01/2019 Elsevier Patient Education  2021 Elsevier Inc.  

## 2020-12-14 NOTE — Assessment & Plan Note (Addendum)
Chronic, ongoing.  A1C at goal recent visit at 5.5% & urine ALB 150 January 2022, A1c trending down from previous. Continue Metformin XR at reduced dose 500 MG BID, wish to avoid hypoglycemia which he has experienced in past.  May be able to reduce further if remains stable.  Due to history of hypoglycemia, which he has had in past, will avoid restart of Glipizide at this time and if elevation could consider SGLT or GLP add on.  Recommend he focus on diet, as still tends to snack a lot at home, and avoid alcohol use. Continue checking BS daily at home.  Continue Lisinopril for kidney protection.  Return in 2 months.

## 2020-12-14 NOTE — Assessment & Plan Note (Signed)
BMI 33.75 with T2DM, HTN.  Recommended eating smaller high protein, low fat meals more frequently and exercising 30 mins a day 5 times a week with a goal of 10-15lb weight loss in the next 3 months. Patient voiced their understanding and motivation to adhere to these recommendations.  Recommend continue cut back on alcohol -- if any abnormal loss will obtain labs and imaging next visit + consider return to GI.  At this time has gained and is maintaining weight -- ?accuracy of recent weight in office.

## 2020-12-15 ENCOUNTER — Telehealth: Payer: Self-pay

## 2020-12-15 ENCOUNTER — Ambulatory Visit: Payer: Medicare Other | Admitting: Licensed Clinical Social Worker

## 2020-12-15 DIAGNOSIS — E1159 Type 2 diabetes mellitus with other circulatory complications: Secondary | ICD-10-CM

## 2020-12-15 DIAGNOSIS — E1142 Type 2 diabetes mellitus with diabetic polyneuropathy: Secondary | ICD-10-CM

## 2020-12-15 DIAGNOSIS — Z7409 Other reduced mobility: Secondary | ICD-10-CM

## 2020-12-15 NOTE — Telephone Encounter (Signed)
Called [employer], they stated that they had not gotten the corrected paperwork, I emailed it to them she did receive it, she stated that she will send it to medicare for approval.  Tried to call and notify caregiver.

## 2020-12-15 NOTE — Telephone Encounter (Signed)
Pt's care giver Clare Gandy (dpr) came in office to ask about the power wheelchair that they have yet to get stated this has been going on since March Pt stated he is unsure where the disconnect is and would like this looked into as he feel this is neglectful.

## 2020-12-18 NOTE — Telephone Encounter (Signed)
Patient's brother notified   

## 2020-12-21 NOTE — Patient Instructions (Signed)
Visit Information   Goals Addressed             This Visit's Progress    "He needs as much support as he can get"   On track    Patient Self Care Activities:  Attend all scheduled provider appointments Call provider office for new concerns or questions      SW-Find Help in My Community   On track    Timeframe:  Long-Range Goal Priority:  Medium Start Date:   09/14/20                        Expected End Date:  03/07/21                     Follow Up Date- 12/15/20  Patient Self Care Activities:  Attend all scheduled provider appointments Call provider office for new concerns or questions         Patient verbalizes understanding of instructions provided today and agrees to view in MyChart.   Telephone follow up appointment with care management team member scheduled for:01/05/21  Jenel Lucks, MSW, LCSW [facility] Care Management [facility]  [facility] [email or web address] Phone [phone] 7:33 AM

## 2020-12-21 NOTE — Patient Instructions (Signed)
Visit Information   Goals Addressed             This Visit's Progress    "He needs as much support as he can get"   On track    Patient Self Care Activities:  Attend all scheduled provider appointments Call provider office for new concerns or questions      SW-Find Help in My Community   On track    Timeframe:  Long-Range Goal Priority:  Medium Start Date:   09/14/20                        Expected End Date:  03/07/21                     Follow Up Date- 12/15/20  Patient Self Care Activities:  Attend all scheduled provider appointments Call provider office for new concerns or questions         Patient verbalizes understanding of instructions provided today and agrees to view in MyChart.   Telephone follow up appointment with care management team member scheduled for:12/15/20  Jenel Lucks, MSW, LCSW [facility] Care Management [facility]  [facility] [email or web address] Phone [phone] 7:23 AM

## 2020-12-21 NOTE — Chronic Care Management (AMB) (Signed)
Chronic Care Management    Clinical Social Work Note  12/21/2020 Name: [name] MRN: [id number] DOB: May 20, 1971  [name] is a 50 y.o. year old male who is a primary care patient of Cannady, Barbaraann Faster, NP. The CCM team was consulted to assist the patient with chronic disease management and/or care coordination needs related to: Level of Care Concerns.   Engaged with patient's friend, [name], by telephone for follow up visit in response to provider referral for social work chronic care management and care coordination services.   Consent to Services:  The patient was given information about Chronic Care Management services, agreed to services, and gave verbal consent prior to initiation of services.  Please [name] initial visit note for detailed documentation.   Patient agreed to services and consent obtained.   Assessment: Patient's friend, [name], provided all information during this encounter.CCM LCSW updated him regarding his request for CAP services. [name] Care Plan below for interventions and patient self-care actives. Recent life changes /stressors: Housing and current APS case Recommendation: Patient may benefit from, and is in agreement to work with LCSW to address care coordination needs and will continue to work with the clinical team to address health care and disease management related needs.  Follow up Plan: Patient would like continued follow-up.  CCM LCSW will follow up with patient on 01/05/21. Patient will call office if needed prior to next encounter.  SDOH (Social Determinants of Health) assessments and interventions performed:    Advanced Directives Status: Not addressed in this encounter.  CCM Care Plan  No Active Allergies  Outpatient Encounter Medications as of 12/15/2020  Medication Sig   amLODipine (NORVASC) 10 MG tablet Take 1 tablet (10 mg total) by mouth daily.   ASPIR-LOW 81 MG EC tablet TAKE 1 TABLET BY MOUTH ONCE DAILY   atorvastatin  (LIPITOR) 80 MG tablet TAKE 1 TABLET BY MOUTH ONCE DAILY AT 6PM   Blood Glucose Monitoring Suppl ([place]) w/Device KIT Use to check blood sugar at least 2-3 times a day, in morning fasting and then 2 hours after a meal.   Blood Pressure Monitoring (BLOOD PRESSURE MONITOR AUTOMAT) DEVI Use to monitor blood pressure once daily.   carvedilol (COREG) 6.25 MG tablet TAKE 1 TABLET BY MOUTH TWICE DAILY WITH A MEAL   citalopram (CELEXA) 10 MG tablet Take 1 tablet (10 mg total) by mouth daily.   clopidogrel (PLAVIX) 75 MG tablet TAKE 1 TABLET BY MOUTH ONCE DAILY WITH BREAKFAST   donepezil (ARICEPT) 5 MG tablet Take 1 tablet by mouth daily.   gabapentin (NEURONTIN) 300 MG capsule Take 1 capsule (300 mg total) by mouth 3 (three) times daily.   glucose blood (ONETOUCH VERIO) test strip Use to check blood sugar at least 2-3 times a day, in morning fasting and 2 hours after a meal.   Lancet Devices (ACCU-CHEK SOFTCLIX) lancets Use as instructed   lisinopril (ZESTRIL) 5 MG tablet Take 1 tablet (5 mg total) by mouth daily.   metFORMIN (GLUCOPHAGE-XR) 500 MG 24 hr tablet Take 2 tablets (1,000 mg total) by mouth in the morning and at bedtime.   mirabegron ER (MYRBETRIQ) 50 MG TB24 tablet Take 1 tablet (50 mg total) by mouth daily.   Multiple Vitamin (MULTIVITAMIN) tablet Take 1 tablet by mouth daily.   tamsulosin (FLOMAX) 0.4 MG CAPS capsule Take 0.4 mg by mouth daily.   No facility-administered encounter medications on file as of 12/15/2020.    Patient Active  Problem List   Diagnosis Date Noted   Poor mobility 09/06/2020   Type 2 diabetes mellitus with proteinuria ([place]) 12/31/2019   Incontinence of feces 12/10/2019   Urge incontinence of urine 12/10/2019   Chronic communicating hydrocephalus ([place]) 04/22/2019   Vitamin B12 deficiency 12/02/2018   Chronic pain of right ankle 12/18/2015   Obesity 10/16/2015   Hyperlipidemia associated with type 2 diabetes mellitus ([place]) 09/13/2015    History of stroke 12/17/2013   Hypertension associated with diabetes ([place]) 12/17/2013   Cardiomyopathy ([place]) 12/17/2013   Type 2 diabetes mellitus with diabetic polyneuropathy ([place]) 12/17/2013    Conditions to be addressed/monitored:  Level of care concerns, ADL IADL limitations, and Inability to perform ADL's independently  Care Plan : General Social Work (Adult)  Updates made by Patrick Chesterfield, LCSW since 12/21/2020 12:00 AM     Problem: Coping Skills (General Plan of Care)      Long-Range Goal: Coping Skills Enhanced   Start Date: 09/14/2020  This Visit's Progress: On track  Recent Progress: On track  Priority: Medium  Note:   Timeframe:  Long-Range Goal Priority:  Medium Start Date:   09/14/20                        Expected End Date:  03/07/21                     Follow Up Date- 01/05/21  Current Barriers:  Financial constraints Limited social support Level of care concerns ADL IADL limitations Mental Health Concerns  Social Isolation Limited education about criss support resources within the area* Limited access to caregiver Cognitive Deficits Memory Deficits Lacks knowledge of community resource: financial assistance and support resources that are eligible for patient to use.  Clinical Social Work Clinical Goal(s):  Over the next 90 days, client will work with SW to address concerns related to lack of self-care and ongoing financial barriers Over the next 120 days, patient/caregiver will work with SW to address concerns related to lack of support/resource connection. LCSW will assist patient in gaining additional support/resource connection and community resource education in order to maintain health and mental health appropriately   Over the next 120 days, patient will demonstrate improved adherence to self care as evidenced by implementing healthy self-care into his daily routine such as: attending all medical appointments, deep breathing exercises, taking time for  self-reflection, taking medications as prescribed, drinking water and daily exercise to improve mobility.   Over the next 120 days, patient will demonstrate improved health management independence as evidenced by implementing healthy self-care skills and positive support/resources into his daily routine to help cope with stressors and improve overall health and well-being               Over the next 120 days, patient or caregiver will verbalize basic understanding of depression/stress process and self health management plan as evidenced by his participation in development of long term plan of care and institution of self health management strategies  Interventions: Patient's friend, [name], provided all hx for patient. Patient has hx of strokes resulting in limited mobility (balance concerns) Per [name], patient is in need of a power scooter to get around. Pt's brother resides with patient at a boarding house; however, there are concerns regarding patient's safety due to stairs. An APS report has been filed in the community due to concerns of neglect/exploitation He is interested in CAP services for patient. States that  he submitted paperwork to PCP few weeks ago and it was completed incorrectly. He is requesting that the paperwork be completed again with emphasis that patient is unable to independently care for self, so that an aid will be provided to him within the home-LCSW updated [name] of submission of ppwk to PCP's CMA. He was appreciative for the assistance CCM LCSW discussed other supportive services to assist with patient care, such as, the TCLI program. Family is interested in CAP services, which will allow caregiver (brother) to be financially compensated to provide aid care Patient is his own legal guardian CCM LCSW provided a blank copy of the CAP application to Lead CMA at clinic to complete and provide to PCP for submission Discussed plans with patient for ongoing care  management follow up and provided patient with direct contact information for care management team Confirmed stable transportation to patient's upcoming medical appointments.   Patient Self Care Activities:  Attend all scheduled provider appointments Call provider office for new concerns or questions      [name], MSW, [email or web address] Phone [phone] 7:30 AM

## 2020-12-21 NOTE — Chronic Care Management (AMB) (Signed)
Chronic Care Management    Clinical Social Work Note  12/21/2020 Name: [name] MRN: [id number] DOB: 06/14/1971  [name] is a 50 y.o. year old male who is a primary care patient of Cannady, Barbaraann Faster, NP. The CCM team was consulted to assist the patient with chronic disease management and/or care coordination needs related to: Level of Care Concerns.   Engaged with patient's friend by telephone for follow up visit in response to provider referral for social work chronic care management and care coordination services.   Consent to Services:  The patient was given information about Chronic Care Management services, agreed to services, and gave verbal consent prior to initiation of services.  Please see initial visit note for detailed documentation.   Patient agreed to services and consent obtained.   Assessment: Patient's friend, [name], provided all information during this encounter. He is requesting CAP services for patient to obtain additional support in the home. States PCP needs to identify that patient is appropriate for care in a facility, in order, for CAP to provide aid services to keep patient in the home. See Care Plan below for interventions and patient self-care actives. Recent life changes [name]: Housing has stairs that are difficult for patient to navigate and open APS case Recommendation: Patient may benefit from, and is in agreement to work with LCSW to address care coordination needs and will continue to work with the clinical team to address health care and disease management related needs.  Follow up Plan: Patient would like continued follow-up.  CCM LCSW will follow up with patient on 12/15/20. Patient will call office if needed prior to next encounter.   SDOH (Social Determinants of Health) assessments and interventions performed:    Advanced Directives Status: Not addressed in this encounter.  CCM Care Plan  No Active Allergies  Outpatient  Encounter Medications as of 12/14/2020  Medication Sig   amLODipine (NORVASC) 10 MG tablet Take 1 tablet (10 mg total) by mouth daily.   ASPIR-LOW 81 MG EC tablet TAKE 1 TABLET BY MOUTH ONCE DAILY   atorvastatin (LIPITOR) 80 MG tablet TAKE 1 TABLET BY MOUTH ONCE DAILY AT 6PM   Blood Glucose Monitoring Suppl ([place]) w/Device KIT Use to check blood sugar at least 2-3 times a day, in morning fasting and then 2 hours after a meal.   Blood Pressure Monitoring (BLOOD PRESSURE MONITOR AUTOMAT) DEVI Use to monitor blood pressure once daily.   carvedilol (COREG) 6.25 MG tablet TAKE 1 TABLET BY MOUTH TWICE DAILY WITH A MEAL   citalopram (CELEXA) 10 MG tablet Take 1 tablet (10 mg total) by mouth daily.   clopidogrel (PLAVIX) 75 MG tablet TAKE 1 TABLET BY MOUTH ONCE DAILY WITH BREAKFAST   donepezil (ARICEPT) 5 MG tablet Take 1 tablet by mouth daily.   gabapentin (NEURONTIN) 300 MG capsule Take 1 capsule (300 mg total) by mouth 3 (three) times daily.   glucose blood (ONETOUCH VERIO) test strip Use to check blood sugar at least 2-3 times a day, in morning fasting and 2 hours after a meal.   Lancet Devices (ACCU-CHEK SOFTCLIX) lancets Use as instructed   lisinopril (ZESTRIL) 5 MG tablet Take 1 tablet (5 mg total) by mouth daily.   metFORMIN (GLUCOPHAGE-XR) 500 MG 24 hr tablet Take 2 tablets (1,000 mg total) by mouth in the morning and at bedtime.   mirabegron ER (MYRBETRIQ) 50 MG TB24 tablet Take 1 tablet (50 mg total) by mouth daily.  Multiple Vitamin (MULTIVITAMIN) tablet Take 1 tablet by mouth daily.   tamsulosin (FLOMAX) 0.4 MG CAPS capsule Take 0.4 mg by mouth daily.   No facility-administered encounter medications on file as of 12/14/2020.    Patient Active Problem List   Diagnosis Date Noted   Poor mobility 09/06/2020   Type 2 diabetes mellitus with proteinuria ([place]) 12/31/2019   Incontinence of feces 12/10/2019   Urge incontinence of urine 12/10/2019   Chronic communicating  hydrocephalus ([place]) 04/22/2019   Vitamin B12 deficiency 12/02/2018   Chronic pain of right ankle 12/18/2015   Obesity 10/16/2015   Hyperlipidemia associated with type 2 diabetes mellitus ([place]) 09/13/2015   History of stroke 12/17/2013   Hypertension associated with diabetes ([place]) 12/17/2013   Cardiomyopathy ([place]) 12/17/2013   Type 2 diabetes mellitus with diabetic polyneuropathy ([place]) 12/17/2013    Conditions to be addressed/monitored: Level of care concerns  Care Plan : General Social Work (Adult)  Updates made by Rebekah Chesterfield, LCSW since 12/21/2020 12:00 AM     Problem: Coping Skills (General Plan of Care)      Long-Range Goal: Coping Skills Enhanced   Start Date: 09/14/2020  This Visit's Progress: On track  Priority: Medium  Note:   Timeframe:  Long-Range Goal Priority:  Medium Start Date:   09/14/20                        Expected End Date:  03/07/21                     Follow Up Date- 12/15/20  Current Barriers:  Financial constraints Limited social support Level of care concerns ADL IADL limitations Mental Health Concerns  Social Isolation Limited education about criss support resources within the area* Limited access to caregiver Cognitive Deficits Memory Deficits Lacks knowledge of community resource: financial assistance and support resources that are eligible for patient to use.  Clinical Social Work Clinical Goal(s):  Over the next 90 days, client will work with SW to address concerns related to lack of self-care and ongoing financial barriers Over the next 120 days, patient/caregiver will work with SW to address concerns related to lack of support/resource connection. LCSW will assist patient in gaining additional support/resource connection and community resource education in order to maintain health and mental health appropriately   Over the next 120 days, patient will demonstrate improved adherence to self care as evidenced by implementing healthy  self-care into his daily routine such as: attending all medical appointments, deep breathing exercises, taking time for self-reflection, taking medications as prescribed, drinking water and daily exercise to improve mobility.   Over the next 120 days, patient will demonstrate improved health management independence as evidenced by implementing healthy self-care skills and positive support/resources into his daily routine to help cope with stressors and improve overall health and well-being               Over the next 120 days, patient or caregiver will verbalize basic understanding of depression/stress process and self health management plan as evidenced by his participation in development of long term plan of care and institution of self health management strategies  Interventions: Patient's friend, [name], provided all hx for patient. Patient has hx of strokes resulting in limited mobility (balance concerns) Per Mr. Compton, patient is in need of a power scooter to get around. Pt's brother resides with patient at a boarding house; however, there are concerns regarding patient's safety due to  stairs. An APS report has been filed in the community due to concerns of neglect/exploitation He is interested in CAP services for patient. States that he submitted paperwork to PCP few weeks ago and it was completed incorrectly. He is requesting that the paperwork be completed again with emphasis that patient is unable to independently care for self, so that an aid will be provided to him within the home CCM LCSW discussed other supportive services to assist with patient care, such as, the TCLI program. Family is interested in CAP services, which will allow caregiver (brother) to be financially compensated to provide aid care Patient is his own legal guardian CCM LCSW provided a blank copy of the CAP application to Lead CMA at clinic to complete and provide to PCP for submission Discussed plans with patient  for ongoing care management follow up and provided patient with direct contact information for care management team Confirmed stable transportation to patient's upcoming medical appointments.   Patient Self Care Activities:  Attend all scheduled provider appointments Call provider office for new concerns or questions    Task: Support Psychosocial Response to Risk or Actual [place], MSW, [email or web address] Phone [phone] 7:23 AM

## 2020-12-22 ENCOUNTER — Telehealth: Payer: Self-pay

## 2021-01-03 ENCOUNTER — Telehealth: Payer: Self-pay | Admitting: Nurse Practitioner

## 2021-01-03 NOTE — Telephone Encounter (Signed)
[name] called to let office know that since it took so long for provider to send or correctly fill out CAP forms / they have had to start all over with a new application/ he would like to come by office and get a hard  copy of the form/ please advise

## 2021-01-04 NOTE — Telephone Encounter (Signed)
Left message for [name] to apologize for the inconvenience of not receiving the CAP forms and it delaying care for patient. Jaclyn Prime at his earliest convenience give our office a call back or he could drop off forms for patient to be completed.

## 2021-01-05 ENCOUNTER — Telehealth: Payer: Self-pay

## 2021-01-05 ENCOUNTER — Telehealth: Payer: Self-pay | Admitting: Licensed Clinical Social Worker

## 2021-01-05 NOTE — Telephone Encounter (Signed)
    Clinical Social Work  Chronic Care Management   Phone Outreach    01/05/2021 Name: [name] MRN: [id number] DOB: [birth year]  [name] is a 50 y.o. year old male who is a primary care patient of Cannady, Dorie Rank, NP .   F/U phone call today to assess needs, and progress with care plan goals.   A HIPPA compliant phone message was left for the patient providing contact information and requesting a return call.   Plan:CCM LCSW will wait for return call. If no return call is received, Will reach out to patient again in the next 30 .   Review of patient status, including review of consultants reports, relevant laboratory and other test results, and collaboration with appropriate care team members and the patient's provider was performed as part of comprehensive patient evaluation and provision of care management services.    Jenel Lucks, MSW, LCSW [facility] Care Management [place]  [facility] [email or web address] Phone [phone] 2:56 PM

## 2021-01-09 NOTE — Telephone Encounter (Signed)
[name] patient's brother states he will drop off hard copy of patient required paperwork tomorrow to be completed by provider. Verbalized understanding.

## 2021-01-17 ENCOUNTER — Telehealth: Payer: Medicare Other | Admitting: *Deleted

## 2021-01-17 NOTE — Telephone Encounter (Signed)
Pt. Form [place] DEPT OF HEALTH MEDICAID REQUEST was dropped off for [name] to fill out. Placed in [facility] for Review

## 2021-01-18 NOTE — Telephone Encounter (Signed)
Paperwork placed in providers folder for signature 

## 2021-01-19 ENCOUNTER — Telehealth: Payer: Self-pay | Admitting: General Practice

## 2021-01-19 ENCOUNTER — Telehealth: Payer: Self-pay

## 2021-01-19 NOTE — Telephone Encounter (Signed)
  Care Management   Follow Up Note   01/19/2021 Name: [name] MRN: [id number] DOB: 1971-01-07   Referred by: [name], NP Reason for referral : Chronic Care Management (RNCM: Follow up for Chronic Disease Management and Care Coordination Needs )   An unsuccessful telephone outreach was attempted today. The patient was referred to the case management team for assistance with care management and care coordination.   Follow Up Plan: A HIPPA compliant phone message was left for the patient providing contact information and requesting a return call.   Alto Denver RN, MSN, CCM Community Care Coordinator [place]  [facility] Mobile: [phone]

## 2021-02-05 ENCOUNTER — Other Ambulatory Visit: Payer: Self-pay

## 2021-02-05 ENCOUNTER — Ambulatory Visit (INDEPENDENT_AMBULATORY_CARE_PROVIDER_SITE_OTHER): Payer: Medicare Other | Admitting: Urology

## 2021-02-05 ENCOUNTER — Encounter: Payer: Self-pay | Admitting: Urology

## 2021-02-05 VITALS — BP 126/82 | HR 71 | Ht 73.0 in | Wt 270.0 lb

## 2021-02-05 DIAGNOSIS — I639 Cerebral infarction, unspecified: Secondary | ICD-10-CM

## 2021-02-05 DIAGNOSIS — N3946 Mixed incontinence: Secondary | ICD-10-CM | POA: Diagnosis not present

## 2021-02-05 LAB — URINALYSIS, COMPLETE
Bilirubin, UA: NEGATIVE
Glucose, UA: NEGATIVE
Ketones, UA: NEGATIVE
Leukocytes,UA: NEGATIVE
Nitrite, UA: NEGATIVE
Protein,UA: NEGATIVE
Specific Gravity, UA: 1.015 (ref 1.005–1.030)
Urobilinogen, Ur: 1 mg/dL (ref 0.2–1.0)
pH, UA: 6.5 (ref 5.0–7.5)

## 2021-02-05 LAB — MICROSCOPIC EXAMINATION
Bacteria, UA: NONE SEEN
Epithelial Cells (non renal): NONE SEEN /hpf (ref 0–10)

## 2021-02-05 MED ORDER — MIRABEGRON ER 50 MG PO TB24: 50.0000 mg | ORAL_TABLET | Freq: Every day | ORAL | 11 refills | Status: DC

## 2021-02-05 MED ORDER — CIPROFLOXACIN HCL 250 MG PO TABS: 250.0000 mg | ORAL_TABLET | Freq: Two times a day (BID) | ORAL | 0 refills | Status: DC

## 2021-02-05 MED ORDER — TAMSULOSIN HCL 0.4 MG PO CAPS: 0.4000 mg | ORAL_CAPSULE | Freq: Every day | ORAL | 11 refills | Status: DC

## 2021-02-05 MED ORDER — TRIMETHOPRIM 100 MG PO TABS
100.0000 mg | ORAL_TABLET | Freq: Every day | ORAL | 11 refills | Status: DC
Start: 2021-02-05 — End: 2021-04-23

## 2021-02-05 NOTE — Progress Notes (Signed)
02/05/2021 9:06 AM   [name] Mar 01, 1971 [id number]  Referring provider: Venita Lick, NP [address] [place],   [address]  Chief Complaint  Patient presents with   Follow-up    HPI: I was consulted to assist the patient's urgency incontinence over the last 2 years.  He has had a stroke and utilizes a cane and walks slowly.  No stress incontinence or bedwetting.  He wears 2 pads a day that can be damp or soaked.   Patient said his flow was reasonable and he feels empty   Reassess in 6 weeks with a residual on Flomax.  Patient aware there is many ways of helping him.  Mild elevated residual of 115 may be false positive.   Urgency much better.  Urge incontinence improved.  Very pleased.     Patient came in today 1 week later now with urge incontinence.  Urine might be strong smelling.  He could not leave a sample but his family or friend will bring back one today and will send it for culture.  I will add Myrbetriq.  They were asking about a condom catheter eventually and it may be needed.    Reassess in 6 weeks on Myrbetriq 50 mg samples and prescription and Flomax.  Patient triggering is going from sitting to standing position.  At patient request I gave a box of condom catheters with the prescription.  Hoping to get him nearly dry as well  Today Urgency stable.  Last culture positive Urgency incontinence worse.  They tried condom catheters and they fall off.  It is difficult to say but the patient based on chart review and today may or may not have gotten the antibiotic.  Difficult to say if he is clinically infected today    PMH: Past Medical History:  Diagnosis Date   Depression    Diabetes mellitus without complication ([place])    Hydrocephalus in adult [facility])    Hyperlipidemia    Hypertension    Seizures ([place])    Stroke [facility])     Surgical History: Past Surgical History:  Procedure Laterality Date   COLONOSCOPY WITH PROPOFOL N/A 03/08/2020   Procedure:  COLONOSCOPY WITH PROPOFOL;  Surgeon: Virgel Manifold, MD;  Location: [facility] ENDOSCOPY;  Service: Endoscopy;  Laterality: N/A;   LOOP RECORDER IMPLANT  12/20/13   MDT LinQ implanted by Dr Lovena Le for cryptogenic stroke   LOOP RECORDER IMPLANT N/A 12/20/2013   Procedure: LOOP RECORDER IMPLANT;  Surgeon: Evans Lance, MD;  Location: [facility] CATH LAB;  Service: Cardiovascular;  Laterality: N/A;    Home Medications:  Allergies as of 02/05/2021   No Active Allergies      Medication List        Accurate as of February 05, 2021  9:06 AM. If you have any questions, ask your nurse or doctor.          accu-chek softclix lancets Use as instructed   amLODipine 10 MG tablet Commonly known as: NORVASC Take 1 tablet (10 mg total) by mouth daily.   Aspir-Low 81 MG EC tablet Generic drug: aspirin TAKE 1 TABLET BY MOUTH ONCE DAILY   atorvastatin 80 MG tablet Commonly known as: LIPITOR TAKE 1 TABLET BY MOUTH ONCE DAILY AT 6PM   Blood Pressure Monitor Automat Devi Use to monitor blood pressure once daily.   carvedilol 6.25 MG tablet Commonly known as: COREG TAKE 1 TABLET BY MOUTH TWICE DAILY WITH A MEAL   citalopram 10 MG  tablet Commonly known as: CELEXA Take 1 tablet (10 mg total) by mouth daily.   clopidogrel 75 MG tablet Commonly known as: PLAVIX TAKE 1 TABLET BY MOUTH ONCE DAILY WITH BREAKFAST   donepezil 5 MG tablet Commonly known as: ARICEPT Take 1 tablet by mouth daily.   gabapentin 300 MG capsule Commonly known as: NEURONTIN Take 1 capsule (300 mg total) by mouth 3 (three) times daily.   lisinopril 5 MG tablet Commonly known as: ZESTRIL Take 1 tablet (5 mg total) by mouth daily.   metFORMIN 500 MG 24 hr tablet Commonly known as: GLUCOPHAGE-XR Take 2 tablets (1,000 mg total) by mouth in the morning and at bedtime.   mirabegron ER 50 MG Tb24 tablet Commonly known as: MYRBETRIQ Take 1 tablet (50 mg total) by mouth daily.   multivitamin tablet Take 1 tablet by mouth  daily.   OneTouch Verio Flex System w/Device Kit Use to check blood sugar at least 2-3 times a day, in morning fasting and then 2 hours after a meal.   OneTouch Verio test strip Generic drug: glucose blood Use to check blood sugar at least 2-3 times a day, in morning fasting and 2 hours after a meal.   tamsulosin 0.4 MG Caps capsule Commonly known as: FLOMAX Take 0.4 mg by mouth daily.        Allergies: No Active Allergies  Family History: Family History  Problem Relation Age of Onset   Stroke Father    Heart disease Father    Hypertension Father    Hypertension Mother    Diabetes Mother    Arthritis Brother    Diabetes Maternal Grandfather     Social History:  reports that he has quit smoking. He has never used smokeless tobacco. He reports current alcohol use of about 9.0 standard drinks of alcohol per week. He reports that he does not use drugs.  ROS:                                        Physical Exam: BP 126/82   Pulse 71   Ht 6' 1"  (1.854 m)   Wt 122.5 kg   BMI 35.62 kg/m   Constitutional:  Alert and oriented, No acute distress. HEENT: [place] AT, moist mucus membranes.  Trachea midline, no masses. Laboratory Data: Lab Results  Component Value Date   WBC 4.2 07/13/2020   HGB 13.6 07/13/2020   HCT 41.4 07/13/2020   MCV 94.1 07/13/2020   PLT 205 07/13/2020    Lab Results  Component Value Date   CREATININE 0.97 07/18/2020    No results found for: PSA  No results found for: TESTOSTERONE  Lab Results  Component Value Date   HGBA1C 5.5 11/14/2020    Urinalysis    Component Value Date/Time   COLORURINE YELLOW (A) 07/13/2020 1313   APPEARANCEUR Clear 10/09/2020 1611   LABSPEC 1.018 07/13/2020 1313   LABSPEC 1.010 12/14/2013 0959   PHURINE 7.0 07/13/2020 1313   GLUCOSEU Negative 10/09/2020 1611   GLUCOSEU Negative 12/14/2013 0959   HGBUR NEGATIVE 07/13/2020 1313   BILIRUBINUR Negative 10/09/2020 1611   BILIRUBINUR  Negative 12/14/2013 0959   KETONESUR NEGATIVE 07/13/2020 1313   PROTEINUR Negative 10/09/2020 1611   PROTEINUR 100 (A) 07/13/2020 1313   UROBILINOGEN 1.0 12/22/2013 1648   NITRITE Negative 10/09/2020 1611   NITRITE NEGATIVE 07/13/2020 1313   LEUKOCYTESUR Negative 10/09/2020 1611  LEUKOCYTESUR NEGATIVE 07/13/2020 1313   LEUKOCYTESUR Negative 12/14/2013 0959    Pertinent Imaging: Urine sent for culture.  Based on last culture I called and ciprofloxacin 250 mg twice a day for 1 week.  He will then go on daily suppression therapy trimethoprim 100 mg 3x11.  Stay on Myrbetriq and Flomax.  Reassess in 8 weeks.  Percutaneous tibial nerve stimulation is an option.  Timed voiding and reasonable goals is important  Assessment & Plan:    There are no diagnoses linked to this encounter.  No follow-ups on file.  Reece Packer, MD  [facility] [address], [place], [place] [address] [phone]

## 2021-02-05 NOTE — Patient Instructions (Addendum)
Take Cipro twice a day for one week then start Trimethoprim daily

## 2021-02-08 LAB — CULTURE, URINE COMPREHENSIVE

## 2021-02-13 ENCOUNTER — Ambulatory Visit: Payer: Medicare Other | Admitting: Nurse Practitioner

## 2021-02-14 ENCOUNTER — Telehealth: Payer: Medicare Other | Admitting: General Practice

## 2021-02-14 ENCOUNTER — Ambulatory Visit (INDEPENDENT_AMBULATORY_CARE_PROVIDER_SITE_OTHER): Payer: Medicare Other | Admitting: General Practice

## 2021-02-14 DIAGNOSIS — E1169 Type 2 diabetes mellitus with other specified complication: Secondary | ICD-10-CM | POA: Diagnosis not present

## 2021-02-14 DIAGNOSIS — E1142 Type 2 diabetes mellitus with diabetic polyneuropathy: Secondary | ICD-10-CM

## 2021-02-14 DIAGNOSIS — I152 Hypertension secondary to endocrine disorders: Secondary | ICD-10-CM

## 2021-02-14 DIAGNOSIS — N3941 Urge incontinence: Secondary | ICD-10-CM

## 2021-02-14 DIAGNOSIS — E1159 Type 2 diabetes mellitus with other circulatory complications: Secondary | ICD-10-CM | POA: Diagnosis not present

## 2021-02-14 DIAGNOSIS — Z7409 Other reduced mobility: Secondary | ICD-10-CM

## 2021-02-14 DIAGNOSIS — G910 Communicating hydrocephalus: Secondary | ICD-10-CM

## 2021-02-14 DIAGNOSIS — Z8673 Personal history of transient ischemic attack (TIA), and cerebral infarction without residual deficits: Secondary | ICD-10-CM

## 2021-02-14 DIAGNOSIS — E785 Hyperlipidemia, unspecified: Secondary | ICD-10-CM

## 2021-02-14 NOTE — Patient Instructions (Signed)
Visit Information  PATIENT GOALS:  Goals Addressed             This Visit's Progress    RNCM: Keep or Improve My Strength       Timeframe:  Long-Range Goal Priority:  Medium Start Date:    02-14-2021                         Expected End Date:    02-14-2022                   Follow Up Date 04/18/2021    - eat healthy to increase strength - increase activity or exercise time a little every week - know who to call for help if I fall - learn how to get up if I fall - plan activity for times when energy is the highest    Why is this important?   Before the stroke you probably did not think much about being safe when you are up and about.  Now, it may be harder for you to get around.  It may also be easier for you to trip or fall.  It is common to have muscle weakness after a stroke. You may also feel like you cannot control an arm or leg.  It will be helpful to work with a physical therapist to get your strength and muscle control back.  It is good to stay as active as you can. Walking and stretching help you stay strong and flexible.  The physical therapist will develop an exercise program just for you.     Notes: 02-14-2021: The patient has his new wheelchair and is happy for that. The patient needs a ramp and a care guide referral placed to see about assistance with a ramp, unsure if this is possible since the patient rents his apartment. [name] is trying to find the patient a more suitable place to live. The patient is stable except for a current UTI that he is being treated for. Will continue to monitor.      RNCM: Plan for Long Term Care-Stroke       Follow Up Date 04/18/2021    - check out stroke recovery classes in the community - check with a lawyer who knows elder law - check with a financial planner - complete a Chief Technology Officer; include caregiver - hold a family meeting with family members and loved ones - check out other places when staying at home is no longer  possible (assisted living center, nursing home) - check out services like in-home help or adult day care - list the symptoms that would make staying at home too hard - look at health insurance policy and other saving accounts so you know how much money you have - make a list future care and financial needs - make a list of people who can help and what they can do    Why is this important?   Having and recovering from a stroke can be scary and stressful.  You can reduce stress by planning.  Preparing for the future is one of the most important things to do.  Thinking about how much care you/your loved one will need and how much it will cost is not easy.  You/your loved one can be part of making decisions for the future.  Making sure that your/your loved one's wishes for care are known is important.     Notes: 02-14-2021: Talked with  [name] who is a friend that helps with the patients care. They are trying to find a more suitable place for the patient to live. The patients brother lives with him and is his primary caregiver. They have filled out CAP paperwork and turned it in but have not heard from it. The patient sees the pcp on 02-16-2021 and [name] is asking for assistance with getting the patient a new hospital bed. Will send message to the pcp concerning the request. The patient also working with the LCSW.         Patient verbalizes understanding of instructions provided today and agrees to view in MyChart.   Telephone follow up appointment with care management team member scheduled for:04-18-2021 at 345 pm  Alto Denver RN, MSN, CCM Community Care Coordinator [place]  [facility] Mobile: [phone]

## 2021-02-14 NOTE — Chronic Care Management (AMB) (Signed)
Chronic Care Management   CCM RN Visit Note  02/14/2021 Name: [name] MRN: [id number] DOB: 25-Feb-1971  Subjective: [name] is a 50 y.o. year old male who is a primary care patient of Cannady, Barbaraann Faster, NP. The care management team was consulted for assistance with disease management and care coordination needs.    Engaged with patient by telephone for follow up visit in response to provider referral for case management and/or care coordination services.   Consent to Services:  The patient was given information about Chronic Care Management services, agreed to services, and gave verbal consent prior to initiation of services.  Please see initial visit note for detailed documentation.   Patient agreed to services and verbal consent obtained.   Assessment: Review of patient past medical history, allergies, medications, health status, including review of consultants reports, laboratory and other test data, was performed as part of comprehensive evaluation and provision of chronic care management services.   SDOH (Social Determinants of Health) assessments and interventions performed:    CCM Care Plan  No Active Allergies  Outpatient Encounter Medications as of 02/14/2021  Medication Sig   amLODipine (NORVASC) 10 MG tablet Take 1 tablet (10 mg total) by mouth daily.   ASPIR-LOW 81 MG EC tablet TAKE 1 TABLET BY MOUTH ONCE DAILY   atorvastatin (LIPITOR) 80 MG tablet TAKE 1 TABLET BY MOUTH ONCE DAILY AT 6PM   Blood Glucose Monitoring Suppl ([place]) w/Device KIT Use to check blood sugar at least 2-3 times a day, in morning fasting and then 2 hours after a meal.   Blood Pressure Monitoring (BLOOD PRESSURE MONITOR AUTOMAT) DEVI Use to monitor blood pressure once daily.   carvedilol (COREG) 6.25 MG tablet TAKE 1 TABLET BY MOUTH TWICE DAILY WITH A MEAL   ciprofloxacin (CIPRO) 250 MG tablet Take 1 tablet (250 mg total) by mouth 2 (two) times daily.   citalopram  (CELEXA) 10 MG tablet Take 1 tablet (10 mg total) by mouth daily.   clopidogrel (PLAVIX) 75 MG tablet TAKE 1 TABLET BY MOUTH ONCE DAILY WITH BREAKFAST   donepezil (ARICEPT) 5 MG tablet Take 1 tablet by mouth daily.   gabapentin (NEURONTIN) 300 MG capsule Take 1 capsule (300 mg total) by mouth 3 (three) times daily.   glucose blood (ONETOUCH VERIO) test strip Use to check blood sugar at least 2-3 times a day, in morning fasting and 2 hours after a meal.   Lancet Devices (ACCU-CHEK SOFTCLIX) lancets Use as instructed   lisinopril (ZESTRIL) 5 MG tablet Take 1 tablet (5 mg total) by mouth daily.   metFORMIN (GLUCOPHAGE-XR) 500 MG 24 hr tablet Take 2 tablets (1,000 mg total) by mouth in the morning and at bedtime.   mirabegron ER (MYRBETRIQ) 50 MG TB24 tablet Take 1 tablet (50 mg total) by mouth daily.   Multiple Vitamin (MULTIVITAMIN) tablet Take 1 tablet by mouth daily.   tamsulosin (FLOMAX) 0.4 MG CAPS capsule Take 1 capsule (0.4 mg total) by mouth daily.   trimethoprim (TRIMPEX) 100 MG tablet Take 1 tablet (100 mg total) by mouth daily.   No facility-administered encounter medications on file as of 02/14/2021.    Patient Active Problem List   Diagnosis Date Noted   Poor mobility 09/06/2020   Type 2 diabetes mellitus with proteinuria ([place]) 12/31/2019   Incontinence of feces 12/10/2019   Urge incontinence of urine 12/10/2019   Chronic communicating hydrocephalus ([place]) 04/22/2019   Vitamin B12 deficiency 12/02/2018   Chronic  pain of right ankle 12/18/2015   Obesity 10/16/2015   Hyperlipidemia associated with type 2 diabetes mellitus ([place]) 09/13/2015   History of stroke 12/17/2013   Hypertension associated with diabetes ([place]) 12/17/2013   Cardiomyopathy ([place]) 12/17/2013   Type 2 diabetes mellitus with diabetic polyneuropathy ([place]) 12/17/2013    Conditions to be addressed/monitored:HTN, HLD, DMII, and urinary incontinence, stroke and brain hydrocephalus syndrone   Care Plan : RNCM:  Management of HLD  Updates made by Vanita Ingles since 02/14/2021 12:00 AM     Problem: RNCM: Management of HLD   Priority: Medium     Long-Range Goal: RNCM: HLD management   Start Date: 07/28/2020  Expected End Date: 09/28/2021  This Visit's Progress: On track  Priority: Medium  Note:   Current Barriers:  Poorly controlled hyperlipidemia, complicated by Brain syndrome, post CVA, ETOH use  Current antihyperlipidemic regimen: Atorvastatin 80 mg QD Most recent lipid panel:  Lab Results  Component Value Date   CHOL 117 07/18/2020   HDL 46 07/18/2020   LDLCALC 54 07/18/2020   TRIG 86 07/18/2020   CHOLHDL 2.5 10/11/2015    ASCVD risk enhancing conditions: age 55,  DM, HTN,  former smoker Unable to independently manage HLD Unable to self administer medications as prescribed Does not attend all scheduled provider appointments Does not adhere to prescribed medication regimen Lacks social connections Unable to perform ADLs independently Unable to perform IADLs independently Does not maintain contact with provider office Does not contact provider office for [place]):   patient will work with Consulting civil engineer, providers, and care team towards execution of optimized self-health management plan patient will verbalize understanding of plan for effective management of HLD  patient will work with [facility] and pcp to address needs related to HLD  patient will attend all scheduled medical appointments: Saw urologist recently, follow up with the pcp on 02-16-2021 Interventions: Collaboration with Venita Lick, NP regarding development and update of comprehensive plan of care as evidenced by provider attestation and co-signature Inter-disciplinary care team collaboration (see longitudinal plan of care) Medication review performed; medication list updated in electronic medical record.  Inter-disciplinary care team collaboration (see longitudinal plan  of care) Referred to pharmacy team for assistance with HLD medication management Evaluation of current treatment plan related to HLD and patient's adherence to plan as established by provider. 02-14-2021: Spoke with [name], the patient is doing well with his HLD. He is eating well and no new concerns related to heart health noted at this time. Will continue to monitor.  Advised patient to call the office for changes in condition or quesitons.  02-14-2021: Reviewed and reinforced  Provided education to patient re: following a heart healthy/ADA diet . 02-14-2021: Reviewed. The patients brother does the best he can with caring for the patient. [name] states they have food and [name] prepares it for [employer]. No acute needs noted.  Discussed plans with patient for ongoing care management follow up and provided patient with direct contact information for care management team Reviewed scheduled/upcoming provider appointments including: 02-16-2021 Cut back on alcohol consumption   Patient Goals/Self-Care Activities: Over the next 120 days, patient will:   - call for medicine refill 2 or 3 days before it runs out - call if I am sick and can't take my medicine - keep a list of all the medicines I take; vitamins and herbals too - learn to read medicine labels - use a pillbox to sort medicine -  use an alarm clock or phone to remind me to take my medicine - change to whole grain breads, cereal, pasta - drink 6 to 8 glasses of water each day - eat 3 to 5 servings of fruits and vegetables each day - eat 5 or 6 small meals each day - fill half the plate with nonstarchy vegetables - limit fast food meals to no more than 1 per week - manage portion size - prepare main meal at home 3 to 5 days each week - be open to making changes - I can manage, know and watch for signs of a heart attack - if I have chest pain, call for help - learn about small changes that will make a big difference - learn my personal  risk factors  - calendar and clock use encouraged - cognitive-stimulating activities promoted - consistent daily routine encouraged - extra time for response allowed - memory aid use encouraged - repetition utilized - social relationships promoted - written communication utilized  Follow Up Plan: Telephone follow up appointment with care management team member scheduled for: 04-18-2021 at 345 pm      Task: RNCM: HLD Completed 02/14/2021  Outcome: Positive  Note:   Care Management Activities:    - calendar and clock use encouraged - cognitive-stimulating activities promoted - consistent daily routine encouraged - extra time for response allowed - memory aid use encouraged - repetition utilized - social relationships promoted - written communication utilized        Care Plan : RNCM: Urinary Incontinence (Adult)  Updates made by Vanita Ingles since 02/14/2021 12:00 AM     Problem: RNCM: Disorder Identification (Urinary Incontinence)   Priority: High     Long-Range Goal: Urinary Incontinence Identified   Start Date: 07/28/2020  Expected End Date: 09/24/2021  This Visit's Progress: On track  Priority: High  Note:   Current Barriers:  Knowledge Deficits related to effective management of urge urinary incontinence  Chronic Disease Management support and education needs related to urinary incontinence  Lacks caregiver support.  Film/video editor.  Literacy barriers Transportation barriers Non-adherence to scheduled provider appointments Non-adherence to prescribed medication regimen Cognitive Deficits Unable to independently manage urinary incontinence  Unable to self administer medications as prescribed Does not attend all scheduled provider appointments Does not adhere to prescribed medication regimen Lacks social connections Unable to perform IADLs independently Does not maintain contact with provider office Does not contact provider office for  questions/concerns  Nurse Case Manager Clinical Goal(s):  patient will verbalize understanding of plan for effective management of urinary incontinence  ,patient will work with RNCM, pcp, and specialist to address needs related to urinary incontience   patient will attend all scheduled medical appointments: patient had an appointment today to see specialist but had an "accident" and urinated on himself. [name] said he would have to reschedule his brothers appointment. Reminded him to make sure he rescheduled the appointment. He was calling after call with RNCM.   patient will demonstrate improved adherence to prescribed treatment plan for urinary incontinence  as evidenced bycontrol of incontinence episodes, decrease in accidents and effective management  Interventions:  1:1 collaboration with Venita Lick, NP regarding development and update of comprehensive plan of care as evidenced by provider attestation and co-signature Inter-disciplinary care team collaboration (see longitudinal plan of care) Evaluation of current treatment plan related to urinary incontinence  and patient's adherence to plan as established by provider. 02-14-2021: The patient saw the urologist on 02-05-2021 for urge incontinence  and UTI. The patient is currently taking abx for UTI. [name], a friend states that the patient is doing well except for the incontinence issues he has been experiencing. States he is incontinent of urine and stool. Has seen GI specialist in the past and recently the urologist. Is open to recommendations. Has follow up with the pcp on 02-16-2021.  Advised patient to call and get missed appointment rescheduled and the importance of follow up. 02-14-2021: Reviewed the importance of keeping scheduled appointments.  Provided education to patient re: monitoring for urinary incontinence and frequency  Discussed plans with patient for ongoing care management follow up and provided patient with direct contact  information for care management team  Patient Goals/Self-Care Activities Over the next 120  days, patient will:  - Patient will self administer medications as prescribed Patient will attend all scheduled provider appointments Patient will attend church or other social activities Patient will continue to perform ADL's independently Patient will continue to perform IADL's independently Patient will call provider office for new concerns or questions Patient will work with BSW to address care coordination needs and will continue to work with the clinical team to address health care and disease management related needs.   - nature and severity of incontinence assessed - use of voiding and symptom diary use promoted  Follow Up Plan: Telephone follow up appointment with care management team member scheduled for: 04-18-2021 at 345 pm        Task: RNCM: Identify Presence and Type of Urinary Incontinence Completed 02/14/2021  Outcome: Positive  Note:   Care Management Activities:    - nature and severity of incontinence assessed - use of voiding and symptom diary use promoted        Care Plan : RNCM: Stroke (Adult)  Updates made by Vanita Ingles since 02/14/2021 12:00 AM     Problem: RNCM: Emotional Adjustment to Disease (Stroke) and Brain Hydrocephalus syndrome   Priority: Medium     Long-Range Goal: RNCM: Optimal Coping: Stroke and Brain syndrome   Start Date: 07/28/2020  Expected End Date: 11/27/2021  This Visit's Progress: On track  Priority: Medium  Note:   Current Barriers:  Knowledge Deficits related to effective management of post CVA and brain hydrocephalus syndrome  Care Coordination needs related to community resource and support  in a patient with post CVA and Brain syndrome  Chronic Disease Management support and education needs related to management of post CVA and Brain hydrocephalus syndrome  Lacks caregiver support.  Literacy barriers Non-adherence to scheduled  provider appointments Non-adherence to prescribed medication regimen Cognitive Deficits Unable to independently manage care and is dependant on his brother and other to help with his ADLs/IADLS, appointments, and other needs Unable to self administer medications as prescribed Does not attend all scheduled provider appointments Does not adhere to prescribed medication regimen Lacks social connections Unable to perform IADLs independently Does not maintain contact with provider office Does not contact provider office for questions/concerns  Nurse Case Manager Clinical Goal(s):   patient will verbalize understanding of plan for effective management of post CVA and brain hydrocephalus syndrome   patient will work with [facility], CCM team, pcp, and specialist  to address needs related to changes in condition related to post CVA and brain hydrocephalus syndrome   patient will attend all scheduled medical appointments: upcoming appointment with the pcp on 02-16-2021. Sees neurologist on a regular basis.   Interventions:  1:1 collaboration with Venita Lick, NP regarding development and update of  comprehensive plan of care as evidenced by provider attestation and co-signature Inter-disciplinary care team collaboration (see longitudinal plan of care) Evaluation of current treatment plan related to post CVA care, and brain hydrocephalus syndrome  and patient's adherence to plan as established by provider. 09-08-2020: The patient saw the pcp and cardiologist this week. He is stable at this time and his brother is taking his blood pressures at home regularly. Denies any new concerns at this time. The patient corresponded with the RNCM by speaker phone with his brother today. Order has been submitted for a power chair/scooter by the pcp to [employer] supply due to the patients mobility. The patient is using a cane when walking and PT has helped with strengthening. The patient has not had any new falls.  Safety precautions reviewed with the patient and the patients brother. 02-14-2021: Spoke with [name], friend of the patient today. [name] states the patient has his wheelchair and he is very happy about that. He is needing a ramp right now. Will place a care guide referral for assistance in [facility] for a wheelchair ramp. Education provided that this may be an issue since the patient lives in an apartment and rents the apartment. [name] states he is working on helping the patient find a new place to live. The patient has an aide coming in to assist the patient. He cannot remember the agency she is from but states her name is Anne Ng.  [name] is also asking about a hospital bed for the patient. The patient has a hospital bed but [name] states it is worn out. Will collaborate with pcp. Per records the patient received a hospital bed in 2020 so do not know if the insurance will pay for a new bed. Will see what recommendations are. [name] states the patient is stable but he is working on some things so that the patient can stay out of assisted living. The patient wants to remain independent and living on his own. An APS report was filed but [name] states they could not find any reason to move forward with the report.  Advised patient to call the provider for changes in condition or new quesitons  Provided education to patient re: safety in the home, evaluation of recent fall with ER visit. 02-14-2021: The patient remains safe in his home at present time. Denies any new falls.  Discussed plans with patient for ongoing care management follow up and provided patient with direct contact information for care management team Reviewed scheduled/upcoming provider appointments including: with pcp 02-16-2021 at 2 pm  Patient Goals/Self-Care Activities Over the next 120 days, patient will:  - Patient will self administer medications as prescribed Patient will attend all scheduled provider appointments Patient will  call pharmacy for medication refills Patient will continue to perform ADL's independently Patient will continue to perform IADL's independently Patient will call provider office for new concerns or questions Patient will work with BSW to address care coordination needs and will continue to work with the clinical team to address health care and disease management related needs.   - counseling provided - decision-making supported - depression screen reviewed - goal-setting facilitated - positive reinforcement provided - problem-solving facilitated - self-care encouraged - self-reflection promoted - verbalization of feelings encouraged  Follow Up Plan: Telephone follow up appointment with care management team member scheduled for: 04-18-2021 at 345 pm         Task: RNCM: Support Psychosocial Response to Stroke Completed 02/14/2021  Outcome: Positive  Note:  Care Management Activities:    - counseling provided - decision-making supported - depression screen reviewed - goal-setting facilitated - positive reinforcement provided - problem-solving facilitated - self-care encouraged - self-reflection promoted - verbalization of feelings encouraged        Care Plan : RNCM: Hypertension (Adult)  Updates made by Vanita Ingles since 02/14/2021 12:00 AM     Problem: RNCM: Hypertension (Hypertension)   Priority: Medium     Long-Range Goal: RNCM: Hypertension Monitored   Start Date: 07/28/2020  Expected End Date: 02/09/2022  This Visit's Progress: On track  Priority: Medium  Note:   Objective:  Last practice recorded BP readings:  BP Readings from Last 3 Encounters:  02/05/21 126/82  12/14/20 127/86  11/14/20 120/80    Most recent eGFR/CrCl: No results found for: EGFR  No components found for: CRCL Current Barriers:  Knowledge Deficits related to basic understanding of hypertension pathophysiology and self care management Knowledge Deficits related to understanding of  medications prescribed for management of hypertension Non-adherence to prescribed medication regimen Non-adherence to scheduled provider appointments Transportation barriers Cognitive Deficits Limited Social Support Unable to independently manage HTN Unable to self administer medications as prescribed Does not attend all scheduled provider appointments Does not adhere to prescribed medication regimen Lacks social connections Unable to perform IADLs independently Does not maintain contact with provider office Does not contact provider office for questions/concerns Case Manager Clinical Goal(s):   patient will verbalize understanding of plan for hypertension management  patient will attend all scheduled medical appointments: 48-06-2021 with the pcp  patient will demonstrate improved adherence to prescribed treatment plan for hypertension as evidenced by taking all medications as prescribed, monitoring and recording blood pressure as directed, adhering to low sodium/DASH diet  patient will demonstrate improved health management independence as evidenced by checking blood pressure as directed and notifying PCP if SBP>160 or DBP > 90, taking all medications as prescribe, and adhering to a low sodium diet as discussed.  patient will verbalize basic understanding of hypertension disease process and self health management plan as evidenced by effective management of HTN in the home setting, continuing to take blood pressures at home and calling for changes in condition  Interventions:  Collaboration with Venita Lick, NP regarding development and update of comprehensive plan of care as evidenced by provider attestation and co-signature Inter-disciplinary care team collaboration (see longitudinal plan of care) Evaluation of current treatment plan related to hypertension self management and patient's adherence to plan as established by provider. 02-14-2021: The patient is doing well with HTN  management. No new concerns related to HTN at this time. Will continue to monitor.  Provided education to patient re: stroke prevention, s/s of heart attack and stroke, DASH diet, complications of uncontrolled blood pressure Reviewed medications with patient and discussed importance of compliance Discussed plans with patient for ongoing care management follow up and provided patient with direct contact information for care management team Advised patient, providing education and rationale, to monitor blood pressure daily and record, calling PCP for findings outside established parameters. The brother is checking his blood pressure daily and recording. This am his blood pressure was 107/82. Praised for accomplishment of checking blood pressures in the home setting. 09-08-2020: Blood pressure readings at home for the last 3 readings: 160/100, 148/90, and 143/70. Praised for taking readings. Education on monitoring for blood pressures systolic >917 and diastolic >91.  The patients brother verbalized understanding.  Reviewed scheduled/upcoming provider appointments including: 02-16-2021 at 2 pm Patient Goals/Self-Care Activities  Over the next 120 days, patient will:  - UNABLE to independently manage HTN Self administers medications as prescribed Attends all scheduled provider appointments Calls provider office for new concerns, questions, or BP outside discussed parameters Checks BP and records as discussed Follows a low sodium diet/DASH diet - blood pressure trends reviewed - depression screen reviewed - home or ambulatory blood pressure monitoring encouraged Follow Up Plan: Telephone follow up appointment with care management team member scheduled for: 04-18-2021 at 345 pm    Task: RNCM: Identify and Monitor Blood Pressure Elevation Completed 02/14/2021  Outcome: Positive  Note:   Care Management Activities:    - blood pressure trends reviewed - depression screen reviewed - home or ambulatory  blood pressure monitoring encouraged        Care Plan : RNCM: Diabetes Type 2 (Adult)  Updates made by Vanita Ingles since 02/14/2021 12:00 AM  Completed 02/14/2021   Problem: RNCM: Glycemic Management (Diabetes, Type 2) Resolved 02/14/2021  Priority: Medium     Goal: RNCM: Glycemic Management Optimized Completed 02/14/2021  Start Date: 07/28/2020  This Visit's Progress: On track  Priority: Medium  Note:   Objective: DM stable at this time. Completing goal. Will continue to monitor for changes.  Lab Results  Component Value Date   HGBA1C 5.5 11/14/2020   Lab Results  Component Value Date   CREATININE 0.97 07/18/2020   CREATININE 1.20 07/13/2020   CREATININE 1.08 01/11/2020   No results found for: EGFR Current Barriers:  Knowledge Deficits related to basic Diabetes pathophysiology and self care/management Knowledge Deficits related to medications used for management of diabetes Literacy barriers Cognitive Deficits Limited Social Support Unable to self administer medications as prescribed Does not attend all scheduled provider appointments Does not adhere to prescribed medication regimen Lacks social connections Unable to perform IADLs independently Does not maintain contact with provider office Does not contact provider office for questions/concerns Case Manager Clinical Goal(s):  Collaboration with Marnee Guarneri T, NP regarding development and update of comprehensive plan of care as evidenced by provider attestation and co-signature Inter-disciplinary care team collaboration (see longitudinal plan of care) Over the next 120 days, patient will demonstrate improved adherence to prescribed treatment plan for diabetes self care/management as evidenced by:  daily monitoring and recording of CBG: 09-08-2020: last 3 readings are 134, 102, 112.  The patients brother had not taken results at the time of the call. Praised the brother for consistently checking his blood sugars.   adherence to ADA/ carb modified diet. 09-08-2020: Review and education on eating healthy options  adherence to prescribed medication regimen. 09-08-2020: Confirms adherence to medications regimen  Interventions:  Provided education to patient about basic DM disease process Reviewed medications with patient and discussed importance of medication adherence Discussed plans with patient for ongoing care management follow up and provided patient with direct contact information for care management team Provided patient with written educational materials related to hypo and hyperglycemia and importance of correct treatment Reviewed scheduled/upcoming provider appointments including: 10-19-2020 Advised patient, providing education and rationale, to check cbg BID and record, calling pcp for findings outside established parameters.  Readings for 07-28-2020 was 97 and 07-27-2020 was 119. 09-08-2020: Last 3 readings: 134, 102, 112. Denies any episodes of highs or lows.  Review of patient status, including review of consultants reports, relevant laboratory and other test results, and medications completed. Patient Goals/Self-Care Activities Over the next 120 days, patient will:  - UNABLE to independently manage DM Self administers oral medications as prescribed  Self administers insulin as prescribed Attends all scheduled provider appointments Checks blood sugars as prescribed and utilize hyper and hypoglycemia protocol as needed Adheres to prescribed ADA/carb modified - barriers to adherence to treatment plan identified - blood glucose monitoring encouraged - blood glucose readings reviewed - individualized medical nutrition therapy provided - mutual A1C goal set or reviewed - resources required to improve adherence to care identified - self-awareness of signs/symptoms of hypo or hyperglycemia encouraged - use of blood glucose monitoring log promoted Follow Up Plan: Telephone follow up appointment with care  management team member scheduled for: 11-17-2020 at 0945 am    Task: RNCM: Alleviate Barriers to Glycemic Management Completed 02/14/2021  Outcome: Positive  Note:   Care Management Activities:    - barriers to adherence to treatment plan identified - blood glucose monitoring encouraged - blood glucose readings reviewed - individualized medical nutrition therapy provided - mutual A1C goal set or reviewed - resources required to improve adherence to care identified - self-awareness of signs/symptoms of hypo or hyperglycemia encouraged - use of blood glucose monitoring log promoted         Plan:Telephone follow up appointment with care management team member scheduled for:  04-18-2021 at 345 pm  [place], MSN, [facility] Mobile: [phone]

## 2021-02-16 ENCOUNTER — Ambulatory Visit (INDEPENDENT_AMBULATORY_CARE_PROVIDER_SITE_OTHER): Payer: Medicare Other | Admitting: Nurse Practitioner

## 2021-02-16 ENCOUNTER — Other Ambulatory Visit: Payer: Self-pay

## 2021-02-16 ENCOUNTER — Encounter: Payer: Self-pay | Admitting: Nurse Practitioner

## 2021-02-16 VITALS — BP 128/77 | HR 76 | Wt 270.0 lb

## 2021-02-16 DIAGNOSIS — I639 Cerebral infarction, unspecified: Secondary | ICD-10-CM

## 2021-02-16 DIAGNOSIS — I429 Cardiomyopathy, unspecified: Secondary | ICD-10-CM

## 2021-02-16 DIAGNOSIS — E1159 Type 2 diabetes mellitus with other circulatory complications: Secondary | ICD-10-CM

## 2021-02-16 DIAGNOSIS — E66811 Obesity, class 1: Secondary | ICD-10-CM

## 2021-02-16 DIAGNOSIS — E1129 Type 2 diabetes mellitus with other diabetic kidney complication: Secondary | ICD-10-CM | POA: Diagnosis not present

## 2021-02-16 DIAGNOSIS — E6609 Other obesity due to excess calories: Secondary | ICD-10-CM

## 2021-02-16 DIAGNOSIS — E1142 Type 2 diabetes mellitus with diabetic polyneuropathy: Secondary | ICD-10-CM | POA: Diagnosis not present

## 2021-02-16 DIAGNOSIS — Z6833 Body mass index (BMI) 33.0-33.9, adult: Secondary | ICD-10-CM

## 2021-02-16 DIAGNOSIS — I152 Hypertension secondary to endocrine disorders: Secondary | ICD-10-CM

## 2021-02-16 DIAGNOSIS — E1169 Type 2 diabetes mellitus with other specified complication: Secondary | ICD-10-CM

## 2021-02-16 DIAGNOSIS — N3941 Urge incontinence: Secondary | ICD-10-CM

## 2021-02-16 DIAGNOSIS — E538 Deficiency of other specified B group vitamins: Secondary | ICD-10-CM

## 2021-02-16 DIAGNOSIS — E785 Hyperlipidemia, unspecified: Secondary | ICD-10-CM

## 2021-02-16 DIAGNOSIS — R809 Proteinuria, unspecified: Secondary | ICD-10-CM

## 2021-02-16 LAB — BAYER DCA HB A1C WAIVED: HB A1C (BAYER DCA - WAIVED): 5.5 % (ref <7.0)

## 2021-02-16 MED ORDER — METFORMIN HCL ER 500 MG PO TB24: 500.0000 mg | ORAL_TABLET | Freq: Every day | ORAL | 4 refills | Status: DC

## 2021-02-16 NOTE — Progress Notes (Signed)
BP 128/77   Pulse 76   Wt 270 lb (122.5 kg)   SpO2 98%   BMI 35.62 kg/m    Subjective:    Patient ID: [name], male    DOB: 1970-09-15, 50 y.o.   MRN: [id number]  HPI: [name] is a 50 y.o. male  Chief Complaint  Patient presents with   Urinary Incontinence    Patient brother states patient is still having urinary/bladder issues and states patient says he will not have to use the restroom and when he stands up he has leakage. Patient states he has been having issues for the last year and nothing seems to helps the patient.    DIABETES Continues on Metformin XR 500 MG BID (reduced last visit) with last A1C 5.5% in May 2022 and urine ALB 150.  Endorses intermittent poor diet choices.    He reports drinking about 2-4 beers a day and liquor on weekends, cutting back.  History of B12 deficiency with last level improved with supplement.   Hypoglycemic episodes:no Polydipsia/polyuria: no Visual disturbance: no Chest pain: no Paresthesias: no Glucose Monitoring: yes             Accucheck frequency: daily             Fasting glucose: none over 150 recently             Post prandial:             Evening:             Before meals: Taking Insulin?: no             Long acting insulin:             Short acting insulin: Blood Pressure Monitoring: daily Retinal Examination: Not up to Date -- not scheduled Foot Exam: Up to Date Pneumovax: Up to Date Influenza: up to date Aspirin: yes   DEPRESSION Continues on Celexa 10 MG daily. Mood status: stable Satisfied with current treatment?: yes Symptom severity: moderate  Duration of current treatment : chronic Side effects: no Medication compliance: good compliance Psychotherapy/counseling: in the past Depressed mood:none Anxious mood: no Anhedonia: no Significant weight loss or gain: no Insomnia: none Fatigue: no Feelings of worthlessness or guilt: no Impaired concentration/indecisiveness: no Suicidal ideations:  no Hopelessness: no Crying spells: no Depression screen [facility] 2/9 02/16/2021 12/14/2020 03/20/2020 10/01/2019 09/02/2018  Decreased Interest 0 0 0 0 2  Down, Depressed, Hopeless 0 0 0 0 1  PHQ - 2 Score 0 0 0 0 3  Altered sleeping 0 - - 0 1  Tired, decreased energy 0 - - 0 1  Change in appetite 0 - - 0 1  Feeling bad or failure about yourself  0 - - 0 1  Trouble concentrating 0 - - 0 2  Moving slowly or fidgety/restless 0 - - 0 1  Suicidal thoughts 0 - - 0 0  PHQ-9 Score 0 - - 0 10  Difficult doing work/chores Not difficult at all - - Not difficult at all Not difficult at all      HYPERTENSION / HYPERLIPIDEMIA Continues on Amlodipine 10 MG, ASA, Plavix, Carvedilol 6.25 MG BID, Lisinopril 5 MG QDAY, and Lipitor 80 MG daily. Satisfied with current treatment? yes Duration of hypertension: chronic BP monitoring frequency: not checking BP range: not checking BP medication side effects: no Duration of hyperlipidemia: chronic Cholesterol medication side effects: no Cholesterol supplements: none Medication compliance: good compliance Aspirin: yes Recent stressors:  no Recurrent headaches: no Visual changes: no Palpitations: no Dyspnea: no Chest pain: no Lower extremity edema: no Dizzy/lightheaded: no    URINARY INCONTINENCE Followed by urology with last visit 02/05/21 with Dr. Sherron Monday.  They have placed him on daily suppression for UTI with Trimethoprim 100 MG.  He is to stay on Myrbetriq and Flomax, then return in 8 weeks.  They are consider PTN stimulation.  Changing positions causes him urge to urinate.  Continues to have incontinence issues even with medication on board. Status: uncontrolled Satisfied with current treatment?: yes Medication side effects: no Medication compliance: good compliance Duration: chronic Nocturia: 2-3x per night Urinary frequency:yes Incomplete voiding: no Urgency: yes Weak urinary stream: no Straining to start stream: no Dysuria: no Onset:  gradual Severity: moderate  Relevant past medical, surgical, family and social history reviewed and updated as indicated. Interim medical history since our last visit reviewed. Allergies and medications reviewed and updated.  Review of Systems  Constitutional:  Negative for activity change, diaphoresis, fatigue and fever.  Respiratory:  Negative for cough, chest tightness, shortness of breath and wheezing.   Cardiovascular:  Negative for chest pain, palpitations and leg swelling.  Gastrointestinal:  Negative for abdominal distention, abdominal pain, constipation, diarrhea, nausea and vomiting.  Endocrine: Negative for cold intolerance, heat intolerance, polydipsia, polyphagia and polyuria.  Neurological:  Negative for dizziness, syncope, weakness, light-headedness, numbness and headaches.  Psychiatric/Behavioral: Negative.     Per HPI unless specifically indicated above     Objective:    BP 128/77   Pulse 76   Wt 270 lb (122.5 kg)   SpO2 98%   BMI 35.62 kg/m   Wt Readings from Last 3 Encounters:  02/16/21 270 lb (122.5 kg)  02/05/21 270 lb (122.5 kg)  12/14/20 270 lb 6.4 oz (122.7 kg)    Physical Exam Vitals and nursing note reviewed.  Constitutional:      General: He is awake. He is not in acute distress.    Appearance: He is well-developed. He is obese. He is not ill-appearing.  HENT:     Head: Normocephalic and atraumatic.     Right Ear: Hearing normal. No drainage.     Left Ear: Hearing normal. No drainage.  Eyes:     General: Lids are normal.        Right eye: No discharge.        Left eye: No discharge.     Conjunctiva/sclera: Conjunctivae normal.     Pupils: Pupils are equal, round, and reactive to light.  Neck:     Thyroid: No thyromegaly.     Vascular: No carotid bruit.  Cardiovascular:     Rate and Rhythm: Normal rate and regular rhythm.     Heart sounds: Normal heart sounds, S1 normal and S2 normal. No murmur heard.   No gallop.  Pulmonary:     Effort:  Pulmonary effort is normal. No accessory muscle usage or respiratory distress.     Breath sounds: Normal breath sounds.  Abdominal:     General: Bowel sounds are normal.     Palpations: Abdomen is soft.  Musculoskeletal:        General: Normal range of motion.     Cervical back: Normal range of motion and neck supple.     Right lower leg: No edema.     Left lower leg: No edema.     Comments: Utilizing electric wheel chair today.  Skin:    General: Skin is warm and dry.  Capillary Refill: Capillary refill takes less than 2 seconds.  Neurological:     Mental Status: He is alert and oriented to person, place, and time.  Psychiatric:        Attention and Perception: Attention normal.        Mood and Affect: Mood normal.        Speech: Speech normal.        Behavior: Behavior normal. Behavior is cooperative.    Results for orders placed or performed in visit on 02/05/21  CULTURE, URINE COMPREHENSIVE   Specimen: Urine   UR  Result Value Ref Range   Urine Culture, Comprehensive Final report    Organism ID, Bacteria Comment   Microscopic Examination   Urine  Result Value Ref Range   WBC, UA 0-5 0 - 5 /hpf   RBC 0-2 0 - 2 /hpf   Epithelial Cells (non renal) None seen 0 - 10 /hpf   Bacteria, UA None seen None seen/Few  Urinalysis, Complete  Result Value Ref Range   Specific Gravity, UA 1.015 1.005 - 1.030   pH, UA 6.5 5.0 - 7.5   Color, UA Yellow Yellow   Appearance Ur Clear Clear   Leukocytes,UA Negative Negative   Protein,UA Negative Negative/Trace   Glucose, UA Negative Negative   Ketones, UA Negative Negative   RBC, UA Trace (A) Negative   Bilirubin, UA Negative Negative   Urobilinogen, Ur 1.0 0.2 - 1.0 mg/dL   Nitrite, UA Negative Negative   Microscopic Examination See below:       Assessment & Plan:   Problem List Items Addressed This Visit       Cardiovascular and Mediastinum   Hypertension associated with diabetes (HCC)    Chronic, ongoing.  BP at goal  today.  Continue current medication regimen and adjust as needed.  Recommend checking BP daily at home and documenting for provider. He has a BP cuff available at his home. BMP today.  Refills up to date.  Return in 3 months for f/u.      Relevant Medications   metFORMIN (GLUCOPHAGE-XR) 500 MG 24 hr tablet   Other Relevant Orders   Bayer DCA Hb A1c Waived   Basic metabolic panel   Cardiomyopathy (HCC)    Stable, followed by cardiology, continue this collaboration.        Endocrine   Hyperlipidemia associated with type 2 diabetes mellitus (HCC)    Chronic, ongoing.  Continue current medication regimen and adjust as needed.  Lipid panel today.  Refills up to date. Return in 3 months.      Relevant Medications   metFORMIN (GLUCOPHAGE-XR) 500 MG 24 hr tablet   Other Relevant Orders   Bayer DCA Hb A1c Waived   Lipid Panel w/o Chol/HDL Ratio   Type 2 diabetes mellitus with diabetic polyneuropathy (HCC) - Primary    Chronic, ongoing.  A1C 5.5% today.  Decrease Metformin XR to 500 MG QDAY -- script sent, he denies GI issues.  Due to history of hypoglycemia, which he has had in past, will avoid restart of Glipizide at this time and if elevation consider SGLT or GLP add on. Continue Gabapentin for discomfort. Recommend he focus on diet, as still tends to snack a lot at home, and avoid alcohol use. Continue checking BS daily at home.  Return in 3 months.      Relevant Medications   metFORMIN (GLUCOPHAGE-XR) 500 MG 24 hr tablet   Other Relevant Orders   Bayer DCA Hb A1c  Waived   Basic metabolic panel   Ambulatory referral to Ophthalmology   Type 2 diabetes mellitus with proteinuria (HCC)    Chronic, ongoing.  A1C 5.5% today.  Decrease Metformin XR to 500 MG QDAY -- script sent, he denies GI issues.  Due to history of hypoglycemia, which he has had in past, will avoid restart of Glipizide at this time and if elevation consider SGLT or GLP add on. Continue Gabapentin for discomfort. Recommend he  focus on diet, as still tends to snack a lot at home, and avoid alcohol use. Continue checking BS daily at home.  Return in 3 months.      Relevant Medications   metFORMIN (GLUCOPHAGE-XR) 500 MG 24 hr tablet   Other Relevant Orders   Bayer DCA Hb A1c Waived   Ambulatory referral to Ophthalmology     Other   Obesity    BMI 33.75 with T2DM, HTN.  Recommended eating smaller high protein, low fat meals more frequently and exercising 30 mins a day 5 times a week with a goal of 10-15lb weight loss in the next 3 months. Patient voiced their understanding and motivation to adhere to these recommendations.  Recommend continue cut back on alcohol.      Relevant Medications   metFORMIN (GLUCOPHAGE-XR) 500 MG 24 hr tablet   Vitamin B12 deficiency    Chronic, stable. Recheck today.  Continue daily supplement which is benefiting neuropathy.      Relevant Orders   Vitamin B12   Urge incontinence of urine    Followed by urology, continue this collaboration and medications as prescribed by them.  Recent note reviewed.        Follow up plan: Return in about 3 months (around 05/19/2021) for T2DM, HTN/HLD, INCONTINENCE, MOOD.

## 2021-02-16 NOTE — Assessment & Plan Note (Signed)
Chronic, ongoing.  Continue current medication regimen and adjust as needed.  Lipid panel today.  Refills up to date. Return in 3 months. 

## 2021-02-16 NOTE — Patient Instructions (Signed)
Diabetes Mellitus and Nutrition, Adult When you have diabetes, or diabetes mellitus, it is very important to have healthy eating habits because your blood sugar (glucose) levels are greatly affected by what you eat and drink. Eating healthy foods in the right amounts, at about the same times every day, can help you:  Control your blood glucose.  Lower your risk of heart disease.  Improve your blood pressure.  Reach or maintain a healthy weight. What can affect my meal plan? Every person with diabetes is different, and each person has different needs for a meal plan. Your health care provider may recommend that you work with a dietitian to make a meal plan that is best for you. Your meal plan may vary depending on factors such as:  The calories you need.  The medicines you take.  Your weight.  Your blood glucose, blood pressure, and cholesterol levels.  Your activity level.  Other health conditions you have, such as heart or kidney disease. How do carbohydrates affect me? Carbohydrates, also called carbs, affect your blood glucose level more than any other type of food. Eating carbs naturally raises the amount of glucose in your blood. Carb counting is a method for keeping track of how many carbs you eat. Counting carbs is important to keep your blood glucose at a healthy level, especially if you use insulin or take certain oral diabetes medicines. It is important to know how many carbs you can safely have in each meal. This is different for every person. Your dietitian can help you calculate how many carbs you should have at each meal and for each snack. How does alcohol affect me? Alcohol can cause a sudden decrease in blood glucose (hypoglycemia), especially if you use insulin or take certain oral diabetes medicines. Hypoglycemia can be a life-threatening condition. Symptoms of hypoglycemia, such as sleepiness, dizziness, and confusion, are similar to symptoms of having too much  alcohol.  Do not drink alcohol if: ? Your health care provider tells you not to drink. ? You are pregnant, may be pregnant, or are planning to become pregnant.  If you drink alcohol: ? Do not drink on an empty stomach. ? Limit how much you use to:  0-1 drink a day for women.  0-2 drinks a day for men. ? Be aware of how much alcohol is in your drink. In the [place] one drink equals one 12 oz bottle of beer (355 mL), one 5 oz glass of wine (148 mL), or one 1 oz glass of hard liquor (44 mL). ? Keep yourself hydrated with water, diet soda, or unsweetened iced tea.  Keep in mind that regular soda, juice, and other mixers may contain a lot of sugar and must be counted as carbs. What are tips for following this plan? Reading food labels  Start by checking the serving size on the "Nutrition Facts" label of packaged foods and drinks. The amount of calories, carbs, fats, and other nutrients listed on the label is based on one serving of the item. Many items contain more than one serving per package.  Check the total grams (g) of carbs in one serving. You can calculate the number of servings of carbs in one serving by dividing the total carbs by 15. For example, if a food has 30 g of total carbs per serving, it would be equal to 2 servings of carbs.  Check the number of grams (g) of saturated fats and trans fats in one serving. Choose foods that have   a low amount or none of these fats.  Check the number of milligrams (mg) of salt (sodium) in one serving. Most people should limit total sodium intake to less than 2,300 mg per day.  Always check the nutrition information of foods labeled as "low-fat" or "nonfat." These foods may be higher in added sugar or refined carbs and should be avoided.  Talk to your dietitian to identify your daily goals for nutrients listed on the label. Shopping  Avoid buying canned, pre-made, or processed foods. These foods tend to be high in fat, sodium, and added  sugar.  Shop around the outside edge of the grocery store. This is where you will most often find fresh fruits and vegetables, bulk grains, fresh meats, and fresh dairy. Cooking  Use low-heat cooking methods, such as baking, instead of high-heat cooking methods like deep frying.  Cook using healthy oils, such as olive, canola, or sunflower oil.  Avoid cooking with butter, cream, or high-fat meats. Meal planning  Eat meals and snacks regularly, preferably at the same times every day. Avoid going long periods of time without eating.  Eat foods that are high in fiber, such as fresh fruits, vegetables, beans, and whole grains. Talk with your dietitian about how many servings of carbs you can eat at each meal.  Eat 4-6 oz (112-168 g) of lean protein each day, such as lean meat, chicken, fish, eggs, or tofu. One ounce (oz) of lean protein is equal to: ? 1 oz (28 g) of meat, chicken, or fish. ? 1 egg. ?  cup (62 g) of tofu.  Eat some foods each day that contain healthy fats, such as avocado, nuts, seeds, and fish.   What foods should I eat? Fruits Berries. Apples. Oranges. Peaches. Apricots. Plums. Grapes. Mango. Papaya. Pomegranate. Kiwi. Cherries. Vegetables Lettuce. Spinach. Leafy greens, including kale, chard, collard greens, and mustard greens. Beets. Cauliflower. Cabbage. Broccoli. Carrots. Green beans. Tomatoes. Peppers. Onions. Cucumbers. Brussels sprouts. Grains Whole grains, such as whole-wheat or whole-grain bread, crackers, tortillas, cereal, and pasta. Unsweetened oatmeal. Quinoa. Brown or wild rice. Meats and other proteins Seafood. Poultry without skin. Lean cuts of poultry and beef. Tofu. Nuts. Seeds. Dairy Low-fat or fat-free dairy products such as milk, yogurt, and cheese. The items listed above may not be a complete list of foods and beverages you can eat. Contact a dietitian for more information. What foods should I avoid? Fruits Fruits canned with  syrup. Vegetables Canned vegetables. Frozen vegetables with butter or cream sauce. Grains Refined white flour and flour products such as bread, pasta, snack foods, and cereals. Avoid all processed foods. Meats and other proteins Fatty cuts of meat. Poultry with skin. Breaded or fried meats. Processed meat. Avoid saturated fats. Dairy Full-fat yogurt, cheese, or milk. Beverages Sweetened drinks, such as soda or iced tea. The items listed above may not be a complete list of foods and beverages you should avoid. Contact a dietitian for more information. Questions to ask a health care provider  Do I need to meet with a diabetes educator?  Do I need to meet with a dietitian?  What number can I call if I have questions?  When are the best times to check my blood glucose? Where to find more information:  American Diabetes Association: diabetes.org  Academy of Nutrition and Dietetics: www.eatright.org  National Institute of Diabetes and Digestive and Kidney Diseases: www.niddk.nih.gov  Association of Diabetes Care and Education Specialists: www.diabeteseducator.org Summary  It is important to have healthy eating   habits because your blood sugar (glucose) levels are greatly affected by what you eat and drink.  A healthy meal plan will help you control your blood glucose and maintain a healthy lifestyle.  Your health care provider may recommend that you work with a dietitian to make a meal plan that is best for you.  Keep in mind that carbohydrates (carbs) and alcohol have immediate effects on your blood glucose levels. It is important to count carbs and to use alcohol carefully. This information is not intended to replace advice given to you by your health care provider. Make sure you discuss any questions you have with your health care provider. Document Revised: 06/01/2019 Document Reviewed: 06/01/2019 Elsevier Patient Education  2021 Elsevier Inc.  

## 2021-02-16 NOTE — Assessment & Plan Note (Signed)
BMI 33.75 with T2DM, HTN.  Recommended eating smaller high protein, low fat meals more frequently and exercising 30 mins a day 5 times a week with a goal of 10-15lb weight loss in the next 3 months. Patient voiced their understanding and motivation to adhere to these recommendations.  Recommend continue cut back on alcohol.

## 2021-02-16 NOTE — Assessment & Plan Note (Signed)
Followed by urology, continue this collaboration and medications as prescribed by them.  Recent note reviewed.

## 2021-02-16 NOTE — Assessment & Plan Note (Signed)
Chronic, ongoing.  A1C 5.5% today.  Decrease Metformin XR to 500 MG QDAY -- script sent, he denies GI issues.  Due to history of hypoglycemia, which he has had in past, will avoid restart of Glipizide at this time and if elevation consider SGLT or GLP add on. Continue Gabapentin for discomfort. Recommend he focus on diet, as still tends to snack a lot at home, and avoid alcohol use. Continue checking BS daily at home.  Return in 3 months.

## 2021-02-16 NOTE — Assessment & Plan Note (Signed)
Chronic, ongoing.  A1C 5.5% today.  Decrease Metformin XR to 500 MG QDAY -- script sent, he denies GI issues.  Due to history of hypoglycemia, which he has had in past, will avoid restart of Glipizide at this time and if elevation consider SGLT or GLP add on. Continue Gabapentin for discomfort. Recommend he focus on diet, as still tends to snack a lot at home, and avoid alcohol use. Continue checking BS daily at home.  Return in 3 months. 

## 2021-02-16 NOTE — Assessment & Plan Note (Signed)
Stable, followed by cardiology, continue this collaboration.

## 2021-02-16 NOTE — Assessment & Plan Note (Signed)
Chronic, stable. Recheck today.  Continue daily supplement which is benefiting neuropathy.

## 2021-02-16 NOTE — Assessment & Plan Note (Signed)
Chronic, ongoing.  BP at goal today.  Continue current medication regimen and adjust as needed.  Recommend checking BP daily at home and documenting for provider. He has a BP cuff available at his home. BMP today.  Refills up to date.  Return in 3 months for f/u.

## 2021-02-17 LAB — VITAMIN B12: Vitamin B-12: 1476 pg/mL — ABNORMAL HIGH (ref 232–1245)

## 2021-02-17 LAB — LIPID PANEL W/O CHOL/HDL RATIO
Cholesterol, Total: 113 mg/dL (ref 100–199)
HDL: 61 mg/dL (ref >39)
LDL Chol Calc (NIH): 39 mg/dL (ref 0–99)
Triglycerides: 56 mg/dL (ref 0–149)
VLDL Cholesterol Cal: 13 mg/dL (ref 5–40)

## 2021-02-17 LAB — BASIC METABOLIC PANEL
BUN/Creatinine Ratio: 9 (ref 9–20)
BUN: 10 mg/dL (ref 6–24)
CO2: 21 mmol/L (ref 20–29)
Calcium: 10.1 mg/dL (ref 8.7–10.2)
Chloride: 95 mmol/L — ABNORMAL LOW (ref 96–106)
Creatinine, Ser: 1.06 mg/dL (ref 0.76–1.27)
Glucose: 90 mg/dL (ref 65–99)
Potassium: 4.9 mmol/L (ref 3.5–5.2)
Sodium: 137 mmol/L (ref 134–144)
eGFR: 87 mL/min/{1.73_m2} (ref >59)

## 2021-02-17 NOTE — Progress Notes (Signed)
Contacted via [place] [facility] [place], labs have returned: - Kidney function, creatinine and eGFR, is normal. - Cholesterol levels look great, continue your statin daily. - B12 level a bit elevated now, I would change to taking Vitamin B12 every other day and not every day.   - Glucose was 90 on labs, as I discussed cut back on Metformin to once a day only. Any questions? Keep being awesome!!  Thank you for allowing me to participate in your care.  I appreciate you. Kindest regards, Renita Brocks

## 2021-02-26 ENCOUNTER — Telehealth: Payer: Self-pay | Admitting: Nurse Practitioner

## 2021-02-26 LAB — HM DIABETES EYE EXAM

## 2021-02-26 NOTE — Telephone Encounter (Signed)
After reviewing [name]'s note, pharmacy notified that medication has been decreased to one a day.

## 2021-02-26 NOTE — Telephone Encounter (Signed)
[name], from [employer], calling on behalf of pt. She states that the pts Metformin prescription for 2 in the AM and 2 in the PM, but recent prescription was written as one a day. Please advise.       [phone]

## 2021-02-27 ENCOUNTER — Telehealth: Payer: Self-pay | Admitting: *Deleted

## 2021-02-27 NOTE — Telephone Encounter (Signed)
   Telephone encounter was:  Successful.  02/27/2021 Name: DORRIEN GRUNDER MRN: [id number] DOB: 01-27-1971  [name] is a 50 y.o. year old male who is a primary care patient of Cannady, Dorie Rank, NP . The community resource team was consulted for assistance with patients peer support david requested [place] housinglists and also emailed him information on independent Living services   Care guide performed the following interventions: Patient provided with information about care guide support team and interviewed to confirm resource needs.  Follow Up Plan:  No further follow up planned at this time. The patient has been provided with needed resources. Alois Cliche -[facility] Guide , Embedded Care Coordination [facility], Care Management  [phone] [address] [place] , [place] [address] Email : [email or web address]

## 2021-03-05 ENCOUNTER — Other Ambulatory Visit: Payer: Self-pay

## 2021-03-05 ENCOUNTER — Ambulatory Visit (INDEPENDENT_AMBULATORY_CARE_PROVIDER_SITE_OTHER): Payer: Medicare Other | Admitting: Physician Assistant

## 2021-03-05 VITALS — BP 121/84 | HR 67

## 2021-03-05 DIAGNOSIS — N3941 Urge incontinence: Secondary | ICD-10-CM | POA: Diagnosis not present

## 2021-03-05 LAB — URINALYSIS, COMPLETE
Bilirubin, UA: NEGATIVE
Glucose, UA: NEGATIVE
Ketones, UA: NEGATIVE
Leukocytes,UA: NEGATIVE
Nitrite, UA: NEGATIVE
Specific Gravity, UA: 1.015 (ref 1.005–1.030)
Urobilinogen, Ur: 2 mg/dL — ABNORMAL HIGH (ref 0.2–1.0)
pH, UA: 7.5 (ref 5.0–7.5)

## 2021-03-05 LAB — MICROSCOPIC EXAMINATION: Bacteria, UA: NONE SEEN

## 2021-03-05 MED ORDER — TROSPIUM CHLORIDE 20 MG PO TABS: 20.0000 mg | ORAL_TABLET | Freq: Two times a day (BID) | ORAL | 0 refills | Status: DC

## 2021-03-05 NOTE — Progress Notes (Signed)
In and Out Catheterization  Patient is present today for a I & O catheterization due to urinary frequency. Patient was cleaned and prepped in a sterile fashion with betadine . A 14FR coude cath was inserted no complications were noted , of urine return was noted, urine was yellow in color. A clean urine sample was collected for urinalysis. Bladder was drained  And catheter was removed with out difficulty.    Performed by: Franchot Erichsen, CMA & Gerarda Gunther, CMA

## 2021-03-05 NOTE — Progress Notes (Signed)
03/05/2021 12:32 PM   [name] 1970-10-29 [id number]  CC: Chief Complaint  Patient presents with   Urinary Frequency    HPI: [name] is a 50 y.o. male with PMH stroke, diabetes, and urge incontinence on Flomax and Myrbetriq 50 mg x 1 month who presents today for evaluation of ongoing urinary symptoms.  He is accompanied today by his brother, who contributes to HPI.  Today he reports he thinks in his urge incontinence slightly improved after starting Myrbetriq, however he is not yet at his treatment goal.  His brother adds that he sometimes has 3 episodes of urinary leakage daily and that there is not a wash machine where they live, so he has concerns about odor associated with accumulating laundry.  Patient previously tried condom catheters, but he reports these have been coming off.  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  In-office catheterized UA today positive for trace intact blood, 1+ protein, and 2+0 EU/dL urobilinogen; urine microscopy with 3-10 RBCs/HPF.  Catheterized residual was measured at 175 mL.  PMH: Past Medical History:  Diagnosis Date   Depression    Diabetes mellitus without complication ([place])    Hydrocephalus in adult [facility])    Hyperlipidemia    Hypertension    Seizures ([place])    Stroke [facility])     Surgical History: Past Surgical History:  Procedure Laterality Date   COLONOSCOPY WITH PROPOFOL N/A 03/08/2020   Procedure: COLONOSCOPY WITH PROPOFOL;  Surgeon: Virgel Manifold, MD;  Location: [facility] ENDOSCOPY;  Service: Endoscopy;  Laterality: N/A;   LOOP RECORDER IMPLANT  12/20/13   MDT LinQ implanted by Dr Lovena Le for cryptogenic stroke   LOOP RECORDER IMPLANT N/A 12/20/2013   Procedure: LOOP RECORDER IMPLANT;  Surgeon: Evans Lance, MD;  Location: [facility] CATH LAB;  Service: Cardiovascular;  Laterality: N/A;    Home Medications:  Allergies as of 03/05/2021   No Active Allergies      Medication List         Accurate as of March 05, 2021 12:32 PM. If you have any questions, ask your nurse or doctor.          accu-chek softclix lancets Use as instructed   amLODipine 10 MG tablet Commonly known as: NORVASC Take 1 tablet (10 mg total) by mouth daily.   Aspir-Low 81 MG EC tablet Generic drug: aspirin TAKE 1 TABLET BY MOUTH ONCE DAILY   atorvastatin 80 MG tablet Commonly known as: LIPITOR TAKE 1 TABLET BY MOUTH ONCE DAILY AT 6PM   Blood Pressure Monitor Automat Devi Use to monitor blood pressure once daily.   carvedilol 6.25 MG tablet Commonly known as: COREG TAKE 1 TABLET BY MOUTH TWICE DAILY WITH A MEAL   ciprofloxacin 250 MG tablet Commonly known as: Cipro Take 1 tablet (250 mg total) by mouth 2 (two) times daily.   citalopram 10 MG tablet Commonly known as: CELEXA Take 1 tablet (10 mg total) by mouth daily.   clopidogrel 75 MG tablet Commonly known as: PLAVIX TAKE 1 TABLET BY MOUTH ONCE DAILY WITH BREAKFAST   donepezil 5 MG tablet Commonly known as: ARICEPT Take 1 tablet by mouth daily.   gabapentin 300 MG capsule Commonly known as: NEURONTIN Take 1 capsule (300 mg total) by mouth 3 (three) times daily.   lisinopril 5 MG tablet Commonly known as: ZESTRIL Take 1 tablet (5 mg total) by mouth daily.   metFORMIN 500 MG 24 hr tablet Commonly known  as: GLUCOPHAGE-XR Take 1 tablet (500 mg total) by mouth daily with breakfast.   mirabegron ER 50 MG Tb24 tablet Commonly known as: MYRBETRIQ Take 1 tablet (50 mg total) by mouth daily.   multivitamin tablet Take 1 tablet by mouth daily.   OneTouch Verio Flex System w/Device Kit Use to check blood sugar at least 2-3 times a day, in morning fasting and then 2 hours after a meal.   OneTouch Verio test strip Generic drug: glucose blood Use to check blood sugar at least 2-3 times a day, in morning fasting and 2 hours after a meal.   tamsulosin 0.4 MG Caps capsule Commonly known as: FLOMAX Take 1 capsule (0.4  mg total) by mouth daily.   trimethoprim 100 MG tablet Commonly known as: TRIMPEX Take 1 tablet (100 mg total) by mouth daily.   trospium 20 MG tablet Commonly known as: SANCTURA Take 1 tablet (20 mg total) by mouth 2 (two) times daily. Started by: Debroah Loop, PA-C        Allergies:  No Active Allergies  Family History: Family History  Problem Relation Age of Onset   Stroke Father    Heart disease Father    Hypertension Father    Hypertension Mother    Diabetes Mother    Arthritis Brother    Diabetes Maternal Grandfather     Social History:   reports that he has quit smoking. He has never used smokeless tobacco. He reports current alcohol use of about 9.0 standard drinks per week. He reports that he does not use drugs.  Physical Exam: BP 121/84   Pulse 67   Constitutional:  Alert and oriented, no acute distress, nontoxic appearing HEENT: [place], AT Cardiovascular: No clubbing, cyanosis, or edema Respiratory: Normal respiratory effort, no increased work of breathing Skin: No rashes, bruises or suspicious lesions Neurologic: Grossly intact, no focal deficits, moving all 4 extremities Psychiatric: Normal mood and affect  Laboratory Results: Results for orders placed or performed in visit on 03/05/21  Microscopic Examination   Urine  Result Value Ref Range   WBC, UA 0-5 0 - 5 /hpf   RBC 3-10 (A) 0 - 2 /hpf   Epithelial Cells (non renal) 0-10 0 - 10 /hpf   Bacteria, UA None seen None seen/Few  Urinalysis, Complete  Result Value Ref Range   Specific Gravity, UA 1.015 1.005 - 1.030   pH, UA 7.5 5.0 - 7.5   Color, UA Yellow Yellow   Appearance Ur Hazy (A) Clear   Leukocytes,UA Negative Negative   Protein,UA 1+ (A) Negative/Trace   Glucose, UA Negative Negative   Ketones, UA Negative Negative   RBC, UA Trace (A) Negative   Bilirubin, UA Negative Negative   Urobilinogen, Ur 2.0 (H) 0.2 - 1.0 mg/dL   Nitrite, UA Negative Negative   Microscopic  Examination See below:     Assessment & Plan:   1. Urge incontinence Symptoms improved on combination Flomax and Myrbetriq, however patient is far from his treatment goal.  Additionally, he may have an element of incomplete emptying.  Given some symptomatic improvement on Myrbetriq, I recommend augmenting with trospium with plans for close follow-up with symptom recheck with Dr. Matilde Sprang next month.  Patient is in agreement with this plan.  Unfortunately, I do not think he is a good candidate for penile incontinence clamp given his reports of buried penis.  May consider chronic indwelling Foley catheter in the future if his primary goal is reduced episodes of leakage. - Urinalysis,  Complete - trospium (SANCTURA) 20 MG tablet; Take 1 tablet (20 mg total) by mouth 2 (two) times daily.  Dispense: 60 tablet; Refill: 0   Return in about 4 weeks (around 04/02/2021) for Symptom recheck with PVR with Dr. Matilde Sprang.  Debroah Loop, PA-C  [facility] [address] [place], [place] [address] [phone]

## 2021-04-02 ENCOUNTER — Encounter: Payer: Self-pay | Admitting: Urology

## 2021-04-02 ENCOUNTER — Ambulatory Visit: Payer: Self-pay | Admitting: Urology

## 2021-04-03 ENCOUNTER — Ambulatory Visit: Payer: Medicare Other

## 2021-04-03 DIAGNOSIS — Z0000 Encounter for general adult medical examination without abnormal findings: Secondary | ICD-10-CM

## 2021-04-03 NOTE — Progress Notes (Signed)
Subjective:    [name] is a 50 y.o. male who presents for Medicare Annual/Subsequent preventive examination.   I connected with  [name] on 04/03/21 by an audio only telemedicine application and verified that I am speaking with the correct person using two identifiers.   I discussed the limitations, risks, security and privacy concerns of performing an evaluation and management service by telephone and the availability of in person appointments. I also discussed with the patient that there may be a patient responsible charge related to this service. The patient expressed understanding and verbally consented to this telephonic visit.  Location of Patient: Home Location of Provider: Office  List any persons and their role that are participating in the visit with the patient.   Preventive Screening-Counseling & Management  Tobacco Social History   Tobacco Use  Smoking Status Former  Smokeless Tobacco Never  Tobacco Comments   > 4 years quit    Problems Prior to Visit 1.   Current Problems (verified) Patient Active Problem List   Diagnosis Date Noted   Poor mobility 09/06/2020   Type 2 diabetes mellitus with proteinuria ([place]) 12/31/2019   Incontinence of feces 12/10/2019   Urge incontinence of urine 12/10/2019   Chronic communicating hydrocephalus ([place]) 04/22/2019   Vitamin B12 deficiency 12/02/2018   Chronic pain of right ankle 12/18/2015   Obesity 10/16/2015   Hyperlipidemia associated with type 2 diabetes mellitus ([place]) 09/13/2015   History of stroke 12/17/2013   Hypertension associated with diabetes ([place]) 12/17/2013   Cardiomyopathy ([place]) 12/17/2013   Type 2 diabetes mellitus with diabetic polyneuropathy ([place]) 12/17/2013    Medications Prior to Visit Current Outpatient Medications on File Prior to Visit  Medication Sig Dispense Refill   amLODipine (NORVASC) 10 MG tablet Take 1 tablet (10 mg total) by mouth daily. 90 tablet 4   ASPIR-LOW 81 MG EC tablet  TAKE 1 TABLET BY MOUTH ONCE DAILY 30 tablet 0   atorvastatin (LIPITOR) 80 MG tablet TAKE 1 TABLET BY MOUTH ONCE DAILY AT 6PM 90 tablet 4   Blood Glucose Monitoring Suppl ([place]) w/Device KIT Use to check blood sugar at least 2-3 times a day, in morning fasting and then 2 hours after a meal. 1 kit 1   Blood Pressure Monitoring (BLOOD PRESSURE MONITOR AUTOMAT) DEVI Use to monitor blood pressure once daily. 1 each 1   carvedilol (COREG) 6.25 MG tablet TAKE 1 TABLET BY MOUTH TWICE DAILY WITH A MEAL 180 tablet 4   ciprofloxacin (CIPRO) 250 MG tablet Take 1 tablet (250 mg total) by mouth 2 (two) times daily. 14 tablet 0   citalopram (CELEXA) 10 MG tablet Take 1 tablet (10 mg total) by mouth daily. 90 tablet 4   clopidogrel (PLAVIX) 75 MG tablet TAKE 1 TABLET BY MOUTH ONCE DAILY WITH BREAKFAST 90 tablet 4   donepezil (ARICEPT) 5 MG tablet Take 1 tablet by mouth daily.     gabapentin (NEURONTIN) 300 MG capsule Take 1 capsule (300 mg total) by mouth 3 (three) times daily. 270 capsule 4   glucose blood (ONETOUCH VERIO) test strip Use to check blood sugar at least 2-3 times a day, in morning fasting and 2 hours after a meal. 100 each 12   Lancet Devices (ACCU-CHEK SOFTCLIX) lancets Use as instructed 1 each 0   lisinopril (ZESTRIL) 5 MG tablet Take 1 tablet (5 mg total) by mouth daily. 90 tablet 4   metFORMIN (GLUCOPHAGE-XR) 500 MG 24 hr tablet Take 1  tablet (500 mg total) by mouth daily with breakfast. 90 tablet 4   mirabegron ER (MYRBETRIQ) 50 MG TB24 tablet Take 1 tablet (50 mg total) by mouth daily. 30 tablet 11   Multiple Vitamin (MULTIVITAMIN) tablet Take 1 tablet by mouth daily.     tamsulosin (FLOMAX) 0.4 MG CAPS capsule Take 1 capsule (0.4 mg total) by mouth daily. 30 capsule 11   trimethoprim (TRIMPEX) 100 MG tablet Take 1 tablet (100 mg total) by mouth daily. 30 tablet 11   trospium (SANCTURA) 20 MG tablet Take 1 tablet (20 mg total) by mouth 2 (two) times daily. 60 tablet 0    No current facility-administered medications on file prior to visit.    Current Medications (verified) Current Outpatient Medications  Medication Sig Dispense Refill   amLODipine (NORVASC) 10 MG tablet Take 1 tablet (10 mg total) by mouth daily. 90 tablet 4   ASPIR-LOW 81 MG EC tablet TAKE 1 TABLET BY MOUTH ONCE DAILY 30 tablet 0   atorvastatin (LIPITOR) 80 MG tablet TAKE 1 TABLET BY MOUTH ONCE DAILY AT 6PM 90 tablet 4   Blood Glucose Monitoring Suppl ([place]) w/Device KIT Use to check blood sugar at least 2-3 times a day, in morning fasting and then 2 hours after a meal. 1 kit 1   Blood Pressure Monitoring (BLOOD PRESSURE MONITOR AUTOMAT) DEVI Use to monitor blood pressure once daily. 1 each 1   carvedilol (COREG) 6.25 MG tablet TAKE 1 TABLET BY MOUTH TWICE DAILY WITH A MEAL 180 tablet 4   ciprofloxacin (CIPRO) 250 MG tablet Take 1 tablet (250 mg total) by mouth 2 (two) times daily. 14 tablet 0   citalopram (CELEXA) 10 MG tablet Take 1 tablet (10 mg total) by mouth daily. 90 tablet 4   clopidogrel (PLAVIX) 75 MG tablet TAKE 1 TABLET BY MOUTH ONCE DAILY WITH BREAKFAST 90 tablet 4   donepezil (ARICEPT) 5 MG tablet Take 1 tablet by mouth daily.     gabapentin (NEURONTIN) 300 MG capsule Take 1 capsule (300 mg total) by mouth 3 (three) times daily. 270 capsule 4   glucose blood (ONETOUCH VERIO) test strip Use to check blood sugar at least 2-3 times a day, in morning fasting and 2 hours after a meal. 100 each 12   Lancet Devices (ACCU-CHEK SOFTCLIX) lancets Use as instructed 1 each 0   lisinopril (ZESTRIL) 5 MG tablet Take 1 tablet (5 mg total) by mouth daily. 90 tablet 4   metFORMIN (GLUCOPHAGE-XR) 500 MG 24 hr tablet Take 1 tablet (500 mg total) by mouth daily with breakfast. 90 tablet 4   mirabegron ER (MYRBETRIQ) 50 MG TB24 tablet Take 1 tablet (50 mg total) by mouth daily. 30 tablet 11   Multiple Vitamin (MULTIVITAMIN) tablet Take 1 tablet by mouth daily.     tamsulosin  (FLOMAX) 0.4 MG CAPS capsule Take 1 capsule (0.4 mg total) by mouth daily. 30 capsule 11   trimethoprim (TRIMPEX) 100 MG tablet Take 1 tablet (100 mg total) by mouth daily. 30 tablet 11   trospium (SANCTURA) 20 MG tablet Take 1 tablet (20 mg total) by mouth 2 (two) times daily. 60 tablet 0   No current facility-administered medications for this visit.     Allergies (verified) Patient has no active allergies.   PAST HISTORY  Family History Family History  Problem Relation Age of Onset   Stroke Father    Heart disease Father    Hypertension Father    Hypertension Mother  Diabetes Mother    Arthritis Brother    Diabetes Maternal Grandfather     Social History Social History   Tobacco Use   Smoking status: Former   Smokeless tobacco: Never   Tobacco comments:    > 4 years quit  Substance Use Topics   Alcohol use: Yes    Alcohol/week: 9.0 standard drinks    Types: 7 Cans of beer, 2 Standard drinks or equivalent per week    Comment: in a week    Are there smokers in your home (other than you)?  No  Risk Factors Current exercise habits: The patient does not participate in regular exercise at present.  Dietary issues discussed:     Cardiac risk factors: none.  Depression Screen (Note: if answer to either of the following is "Yes", a more complete depression screening is indicated)   Q1: Over the past two weeks, have you felt down, depressed or hopeless? No  Q2: Over the past two weeks, have you felt little interest or pleasure in doing things? No  Have you lost interest or pleasure in daily life? No  Do you often feel hopeless? No  Do you cry easily over simple problems? No  Activities of Daily Living In your present state of health, do you have any difficulty performing the following activities?:  Driving? Yes Managing money?  No Feeding yourself? Yes Getting from bed to chair? Yes Climbing a flight of stairs? No Preparing food and eating?: No Bathing or  showering? Yes Getting dressed: No Getting to the toilet? No  Using the toilet:No Moving around from place to place: No In the past year have you fallen or had a near fall?:No   Are you sexually active?  No  Do you have more than one partner?  No  Hearing Difficulties: No Do you often ask people to speak up or repeat themselves? No Do you experience ringing or noises in your ears? Yes Do you have difficulty understanding soft or whispered voices? No   Do you feel that you have a problem with memory? No  Do you often misplace items? No  Do you feel safe at home?  Yes  Cognitive Testing  Alert? Yes  Normal Appearance?Yes  Oriented to person? Yes  Place? Yes   Time? Yes  Recall of three objects?  Yes  Can perform simple calculations? Yes  Displays appropriate judgment?Yes  Can read the correct time from a watch face?Yes   Advanced Directives have been discussed with the patient? Yes   List the Names of Other Physician/Practitioners you currently use: 1.    Indicate any recent Medical Services you may have received from other than [facility] providers in the past year (date may be approximate).  Immunization History  Administered Date(s) Administered   Influenza,inj,Quad PF,6+ Mos 09/19/2015, 08/12/2018   PFIZER(Purple Top)SARS-COV-2 Vaccination 11/19/2019, 12/24/2019   Pneumococcal Polysaccharide-23 10/20/2006   Tdap 10/16/2015    Screening Tests Health Maintenance  Topic Date Due   COVID-19 Vaccine (3 - Booster for Pfizer series) 05/25/2020   INFLUENZA VACCINE  02/05/2021   Hepatitis C Screening  07/18/2021 (Originally 08/28/1988)   FOOT EXAM  07/18/2021   HEMOGLOBIN A1C  08/19/2021   OPHTHALMOLOGY EXAM  02/26/2022   TETANUS/TDAP  10/15/2025   COLONOSCOPY (Pts 45-26yr Insurance coverage will need to be confirmed)  03/08/2030   HIV Screening  Completed   HPV VACCINES  Aged Out    All answers were reviewed with the patient and necessary referrals were  made:  Jerelene Redden, [place]   04/03/2021   History reviewed: allergies, current medications, past family history, past medical history, past social history, past surgical history, and problem list  Review of Systems Defer to PCP    Objective:     Vision by Snellen chart: right JSR:PRXYVO to measure, left PFY:TWKMQK to measure There were no vitals taken for this visit. There is no height or weight on file to calculate BMI.       Assessment:          Plan:     During the course of the visit the patient was educated and counseled about appropriate screening and preventive services including:   Influenza vaccine  Diet review for nutrition referral? Yes ____  Not Indicated ____   Patient Instructions (the written plan) was given to the patient.  Medicare Attestation I have personally reviewed: The patient's medical and social history Their use of alcohol, tobacco or illicit drugs Their current medications and supplements The patient's functional ability including ADLs,fall risks, home safety risks, cognitive, and hearing and visual impairment Diet and physical activities Evidence for depression or mood disorders  The patient's weight, height, BMI, and visual acuity have been recorded in the chart.  I have made referrals, counseling, and provided education to the patient based on review of the above and I have provided the patient with a written personalized care plan for preventive services.    Non face to face 60 minutes  Jerelene Redden, [place]   04/03/2021     Mr. Daggs , Thank you for taking time to come for your Medicare Wellness Visit. I appreciate your ongoing commitment to your health goals. Please review the following plan we discussed and let me know if I can assist you in the future.   These are the goals we discussed:  Goals       "He needs as much support as he can get"      Patient Self Care Activities:  Attend all scheduled provider appointments Call provider  office for new concerns or questions      "I don't have my medications" (pt-stated)      Current Barriers:  Patient reports that he does not have any of his morning Pill Packs. He gets prescriptions from [place]):  Over the next 7 days, patient will work with CCM team to address needs related to medication access  Interventions: Comprehensive medication review performed.  Contacted [facility]- patient had already contacted the pharmacy today for refills, and they have prepared the pill packs and can deliver to the patient today.  Reviewed medication list with the pharmacist; all medications on our list match what is being filled  Patient Self Care Activities:  Self administers medications as prescribed  Initial goal documentation       Patient Stated      03/20/2020, no goals      RNCM: Keep or Improve My Strength      Timeframe:  Long-Range Goal Priority:  Medium Start Date:    02-14-2021                         Expected End Date:    02-14-2022                   Follow Up Date 04/18/2021    - eat healthy to increase strength - increase activity or exercise time  a little every week - know who to call for help if I fall - learn how to get up if I fall - plan activity for times when energy is the highest    Why is this important?   Before the stroke you probably did not think much about being safe when you are up and about.  Now, it may be harder for you to get around.  It may also be easier for you to trip or fall.  It is common to have muscle weakness after a stroke. You may also feel like you cannot control an arm or leg.  It will be helpful to work with a physical therapist to get your strength and muscle control back.  It is good to stay as active as you can. Walking and stretching help you stay strong and flexible.  The physical therapist will develop an exercise program just for you.     Notes: 02-14-2021: The patient has his new  wheelchair and is happy for that. The patient needs a ramp and a care guide referral placed to see about assistance with a ramp, unsure if this is possible since the patient rents his apartment. [name] is trying to find the patient a more suitable place to live. The patient is stable except for a current UTI that he is being treated for. Will continue to monitor.       RNCM: Plan for [place]      Follow Up Date 04/18/2021    - check out stroke recovery classes in the community - check with a lawyer who knows elder law - check with a financial planner - complete a Medical laboratory scientific officer; include caregiver - hold a family meeting with family members and loved ones - check out other places when staying at home is no longer possible (assisted living center, nursing home) - check out services like in-home help or adult day care - list the symptoms that would make staying at home too hard - look at health insurance policy and other saving accounts so you know how much money you have - make a list future care and financial needs - make a list of people who can help and what they can do    Why is this important?   Having and recovering from a stroke can be scary and stressful.  You can reduce stress by planning.  Preparing for the future is one of the most important things to do.  Thinking about how much care you/your loved one will need and how much it will cost is not easy.  You/your loved one can be part of making decisions for the future.  Making sure that your/your loved one's wishes for care are known is important.     Notes: 02-14-2021: Talked with [name] who is a friend that helps with the patients care. They are trying to find a more suitable place for the patient to live. The patients brother lives with him and is his primary caregiver. They have filled out CAP paperwork and turned it in but have not heard from it. The patient sees the pcp on 02-16-2021 and [name] is asking for  assistance with getting the patient a new hospital bed. Will send message to the pcp concerning the request. The patient also working with the LCSW.       SW-Find Help in My Community      Timeframe:  Long-Range Goal Priority:  Medium Start Date:   09/14/20  Expected End Date:  03/07/21                     Follow Up Date- 12/15/20  Patient Self Care Activities:  Attend all scheduled provider appointments Call provider office for new concerns or questions        This is a list of the screening recommended for you and due dates:  Health Maintenance  Topic Date Due   COVID-19 Vaccine (3 - Booster for Pfizer series) 05/25/2020   Flu Shot  02/05/2021   Hepatitis C Screening: USPSTF Recommendation to screen - Ages 18-79 yo.  07/18/2021*   Complete foot exam   07/18/2021   Hemoglobin A1C  08/19/2021   Eye exam for diabetics  02/26/2022   Tetanus Vaccine  10/15/2025   Colon Cancer Screening  03/08/2030   HIV Screening  Completed   HPV Vaccine  Aged Out  *Topic was postponed. The date shown is not the original due date.

## 2021-04-18 ENCOUNTER — Ambulatory Visit (INDEPENDENT_AMBULATORY_CARE_PROVIDER_SITE_OTHER): Payer: Medicare Other

## 2021-04-18 ENCOUNTER — Telehealth: Payer: Medicare Other | Admitting: General Practice

## 2021-04-18 DIAGNOSIS — G918 Other hydrocephalus: Secondary | ICD-10-CM

## 2021-04-18 DIAGNOSIS — E1169 Type 2 diabetes mellitus with other specified complication: Secondary | ICD-10-CM

## 2021-04-18 DIAGNOSIS — Z8673 Personal history of transient ischemic attack (TIA), and cerebral infarction without residual deficits: Secondary | ICD-10-CM

## 2021-04-18 DIAGNOSIS — I152 Hypertension secondary to endocrine disorders: Secondary | ICD-10-CM

## 2021-04-18 DIAGNOSIS — I10 Essential (primary) hypertension: Secondary | ICD-10-CM

## 2021-04-18 DIAGNOSIS — E1159 Type 2 diabetes mellitus with other circulatory complications: Secondary | ICD-10-CM

## 2021-04-18 DIAGNOSIS — N3941 Urge incontinence: Secondary | ICD-10-CM

## 2021-04-18 NOTE — Patient Instructions (Signed)
Visit Information  PATIENT GOALS:  Goals Addressed             This Visit's Progress    RNCM: Keep or Improve My Strength       Timeframe:  Long-Range Goal Priority:  Medium Start Date:    02-14-2021                         Expected End Date:    02-14-2022                   Follow Up Date 06/20/2021    - eat healthy to increase strength - increase activity or exercise time a little every week - know who to call for help if I fall - learn how to get up if I fall - plan activity for times when energy is the highest    Why is this important?   Before the stroke you probably did not think much about being safe when you are up and about.  Now, it may be harder for you to get around.  It may also be easier for you to trip or fall.  It is common to have muscle weakness after a stroke. You may also feel like you cannot control an arm or leg.  It will be helpful to work with a physical therapist to get your strength and muscle control back.  It is good to stay as active as you can. Walking and stretching help you stay strong and flexible.  The physical therapist will develop an exercise program just for you.     Notes: 02-14-2021: The patient has his new wheelchair and is happy for that. The patient needs a ramp and a care guide referral placed to see about assistance with a ramp, unsure if this is possible since the patient rents his apartment. [name] is trying to find the patient a more suitable place to live. The patient is stable except for a current UTI that he is being treated for. Will continue to monitor. 04-18-2021: The patient is doing well except for urinary incontinence. The patients brother states he is doing okay except for that.       RNCM: Plan for Long Term Care-Stroke       Follow Up Date 06/20/2021    - check out stroke recovery classes in the community - check with a lawyer who knows elder law - check with a financial planner - complete a Chief Technology Officer; include  caregiver - hold a family meeting with family members and loved ones - check out other places when staying at home is no longer possible (assisted living center, nursing home) - check out services like in-home help or adult day care - list the symptoms that would make staying at home too hard - look at health insurance policy and other saving accounts so you know how much money you have - make a list future care and financial needs - make a list of people who can help and what they can do    Why is this important?   Having and recovering from a stroke can be scary and stressful.  You can reduce stress by planning.  Preparing for the future is one of the most important things to do.  Thinking about how much care you/your loved one will need and how much it will cost is not easy.  You/your loved one can be part of making decisions for the future.  Making sure that your/your loved one's wishes for care are known is important.     Notes: 02-14-2021: Talked with [name] who is a friend that helps with the patients care. They are trying to find a more suitable place for the patient to live. The patients brother lives with him and is his primary caregiver. They have filled out CAP paperwork and turned it in but have not heard from it. The patient sees the pcp on 02-16-2021 and [name] is asking for assistance with getting the patient a new hospital bed. Will send message to the pcp concerning the request. The patient also working with the LCSW. 05-19-2021: The patients brother states that he is eating and doing his normal activities. They are still trying to find a new place to live.         Patient verbalizes understanding of instructions provided today and agrees to view in MyChart.   Telephone follow up appointment with care management team member scheduled for: 06-20-2021 at 0945 am  Alto Denver RN, MSN, CCM Community Care Coordinator [facility]  [facility] Mobile: [phone]

## 2021-04-18 NOTE — Chronic Care Management (AMB) (Signed)
Chronic Care Management   CCM RN Visit Note  04/18/2021 Name: [name] MRN: [id number] DOB: 02-28-1971  Subjective: [name] is a 50 y.o. year old male who is a primary care patient of Cannady, Patrick Faster, NP. The care management team was consulted for assistance with disease management and care coordination needs.    Engaged with patient by telephone for follow up visit in response to provider referral for case management and/or care coordination services. Spoke to the patients brother [name].   Consent to Services:  The patient was given information about Chronic Care Management services, agreed to services, and gave verbal consent prior to initiation of services.  Please see initial visit note for detailed documentation.   Patient agreed to services and verbal consent obtained.   Assessment: Review of patient past medical history, allergies, medications, health status, including review of consultants reports, laboratory and other test data, was performed as part of comprehensive evaluation and provision of chronic care management services.   SDOH (Social Determinants of Health) assessments and interventions performed:    CCM Care Plan  No Known Allergies  Outpatient Encounter Medications as of 04/18/2021  Medication Sig   amLODipine (NORVASC) 10 MG tablet Take 1 tablet (10 mg total) by mouth daily.   ASPIR-LOW 81 MG EC tablet TAKE 1 TABLET BY MOUTH ONCE DAILY   atorvastatin (LIPITOR) 80 MG tablet TAKE 1 TABLET BY MOUTH ONCE DAILY AT 6PM   Blood Glucose Monitoring Suppl ([place]) w/Device KIT Use to check blood sugar at least 2-3 times a day, in morning fasting and then 2 hours after a meal.   Blood Pressure Monitoring (BLOOD PRESSURE MONITOR AUTOMAT) DEVI Use to monitor blood pressure once daily.   carvedilol (COREG) 6.25 MG tablet TAKE 1 TABLET BY MOUTH TWICE DAILY WITH A MEAL   ciprofloxacin (CIPRO) 250 MG tablet Take 1 tablet (250 mg total) by mouth 2  (two) times daily.   citalopram (CELEXA) 10 MG tablet Take 1 tablet (10 mg total) by mouth daily.   clopidogrel (PLAVIX) 75 MG tablet TAKE 1 TABLET BY MOUTH ONCE DAILY WITH BREAKFAST   donepezil (ARICEPT) 5 MG tablet Take 1 tablet by mouth daily.   gabapentin (NEURONTIN) 300 MG capsule Take 1 capsule (300 mg total) by mouth 3 (three) times daily.   glucose blood (ONETOUCH VERIO) test strip Use to check blood sugar at least 2-3 times a day, in morning fasting and 2 hours after a meal.   Lancet Devices (ACCU-CHEK SOFTCLIX) lancets Use as instructed   lisinopril (ZESTRIL) 5 MG tablet Take 1 tablet (5 mg total) by mouth daily.   metFORMIN (GLUCOPHAGE-XR) 500 MG 24 hr tablet Take 1 tablet (500 mg total) by mouth daily with breakfast.   mirabegron ER (MYRBETRIQ) 50 MG TB24 tablet Take 1 tablet (50 mg total) by mouth daily.   Multiple Vitamin (MULTIVITAMIN) tablet Take 1 tablet by mouth daily.   tamsulosin (FLOMAX) 0.4 MG CAPS capsule Take 1 capsule (0.4 mg total) by mouth daily.   trimethoprim (TRIMPEX) 100 MG tablet Take 1 tablet (100 mg total) by mouth daily.   trospium (SANCTURA) 20 MG tablet Take 1 tablet (20 mg total) by mouth 2 (two) times daily.   No facility-administered encounter medications on file as of 04/18/2021.    Patient Active Problem List   Diagnosis Date Noted   Poor mobility 09/06/2020   Type 2 diabetes mellitus with proteinuria ([place]) 12/31/2019   Incontinence of feces 12/10/2019  Urge incontinence of urine 12/10/2019   Chronic communicating hydrocephalus ([place]) 04/22/2019   Vitamin B12 deficiency 12/02/2018   Chronic pain of right ankle 12/18/2015   Obesity 10/16/2015   Hyperlipidemia associated with type 2 diabetes mellitus ([place]) 09/13/2015   History of stroke 12/17/2013   Hypertension associated with diabetes ([place]) 12/17/2013   Cardiomyopathy ([place]) 12/17/2013   Type 2 diabetes mellitus with diabetic polyneuropathy ([place]) 12/17/2013    Conditions to be  addressed/monitored:HTN, HLD, and history of stroke, brain syndrome, and urinary incontienence   Care Plan : RNCM: Management of HLD  Updates made by Vanita Ingles, RN since 04/18/2021 12:00 AM     Problem: RNCM: Management of HLD   Priority: Medium     Long-Range Goal: RNCM: HLD management   Start Date: 07/28/2020  Expected End Date: 09/28/2021  This Visit's Progress: On track  Recent Progress: On track  Priority: Medium  Note:   Current Barriers:  Poorly controlled hyperlipidemia, complicated by Brain syndrome, post CVA, ETOH use  Current antihyperlipidemic regimen: Atorvastatin 80 mg QD Most recent lipid panel:  Lab Results  Component Value Date   CHOL 113 02/16/2021   HDL 61 02/16/2021   LDLCALC 39 02/16/2021   TRIG 56 02/16/2021   CHOLHDL 2.5 10/11/2015    ASCVD risk enhancing conditions: age 13,  DM, HTN,  former smoker Unable to independently manage HLD Unable to self administer medications as prescribed Does not attend all scheduled provider appointments Does not adhere to prescribed medication regimen Lacks social connections Unable to perform ADLs independently Unable to perform IADLs independently Does not maintain contact with provider office Does not contact provider office for [place]):   patient will work with Consulting civil engineer, providers, and care team towards execution of optimized self-health management plan patient will verbalize understanding of plan for effective management of HLD  patient will [facility] with [facility] and pcp to address needs related to HLD  patient will attend all scheduled medical appointments: Saw urologist 04-23-2021,  pcp on 02-16-2021 on 05-21-2021 Interventions: Collaboration with Venita Lick, NP regarding development and update of comprehensive plan of care as evidenced by provider attestation and co-signature Inter-disciplinary care team collaboration (see longitudinal plan of  care) Medication review performed; medication list updated in electronic medical record.  Inter-disciplinary care team collaboration (see longitudinal plan of care) Referred to pharmacy team for assistance with HLD medication management Evaluation of current treatment plan related to HLD and patient's adherence to plan as established by provider. 02-14-2021: Spoke with [name], the patient is doing well with his HLD. He is eating well and no new concerns related to heart health noted at this time. Will continue to monitor. 04-18-2021: The patient saw the pcp in August and lab work was good for HLD. No changes in the current plan of care. Advised patient to call the office for changes in condition or quesitons.  04-18-2021: Reviewed and reinforced  Provided education to patient re: following a heart healthy/ADA diet . 02-14-2021: Reviewed. The patients brother does the best he can with caring for the patient. [name] states they have food and [name] prepares it for [employer]. No acute needs noted. 04-18-2021: Denies any new concerns with dietary restrictions Discussed plans with patient for ongoing care management follow up and provided patient with direct contact information for care management team Reviewed scheduled/upcoming provider appointments including: 04-23-2021 with urologist and 05-21-2021 with pcp  Cut back on alcohol consumption   Patient Goals/Self-Care  Activities:  patient will:   - call for medicine refill 2 or 3 days before it runs out - call if I am sick and can't take my medicine - keep a list of all the medicines I take; vitamins and herbals too - learn to read medicine labels - use a pillbox to sort medicine - use an alarm clock or phone to remind me to take my medicine - change to whole grain breads, cereal, pasta - drink 6 to 8 glasses of water each day - eat 3 to 5 servings of fruits and vegetables each day - eat 5 or 6 small meals each day - fill half the plate with  nonstarchy vegetables - limit fast food meals to no more than 1 per week - manage portion size - prepare main meal at home 3 to 5 days each week - be open to making changes - I can manage, know and watch for signs of a heart attack - if I have chest pain, call for help - learn about small changes that will make a big difference - learn my personal risk factors  - calendar and clock use encouraged - cognitive-stimulating activities promoted - consistent daily routine encouraged - extra time for response allowed - memory aid use encouraged - repetition utilized - social relationships promoted - written communication utilized  Follow Up Plan: Telephone follow up appointment with care management team member scheduled for: 06-20-2021 at 0945 am      Care Plan : RNCM: Urinary Incontinence (Adult)  Updates made by Vanita Ingles, RN since 04/18/2021 12:00 AM     Problem: RNCM: Disorder Identification (Urinary Incontinence)   Priority: High     Long-Range Goal: Urinary Incontinence Identified   Start Date: 07/28/2020  Expected End Date: 09/24/2021  This Visit's Progress: On track  Recent Progress: On track  Priority: High  Note:   Current Barriers:  Knowledge Deficits related to effective management of urge urinary incontinence  Chronic Disease Management support and education needs related to urinary incontinence  Lacks caregiver support.  Film/video editor.  Literacy barriers Transportation barriers Non-adherence to scheduled provider appointments Non-adherence to prescribed medication regimen Cognitive Deficits Unable to independently manage urinary incontinence  Unable to self administer medications as prescribed Does not attend all scheduled provider appointments Does not adhere to prescribed medication regimen Lacks social connections Unable to perform IADLs independently Does not maintain contact with provider office Does not contact provider office for  questions/concerns  Nurse Case Manager Clinical Goal(s):  patient will verbalize understanding of plan for effective management of urinary incontinence  ,patient will work with RNCM, pcp, and specialist to address needs related to urinary incontience   patient will attend all scheduled medical appointments: patient had an appointment today to see specialist but had an "accident" and urinated on himself. [name] said he would have to reschedule his brothers appointment. Reminded him to make sure he rescheduled the appointment. He was calling after call with RNCM.   patient will demonstrate improved adherence to prescribed treatment plan for urinary incontinence  as evidenced bycontrol of incontinence episodes, decrease in accidents and effective management  Interventions:  1:1 collaboration with Venita Lick, NP regarding development and update of comprehensive plan of care as evidenced by provider attestation and co-signature Inter-disciplinary care team collaboration (see longitudinal plan of care) Evaluation of current treatment plan related to urinary incontinence  and patient's adherence to plan as established by provider. 02-14-2021: The patient saw the urologist on 02-05-2021 for  urge incontinence and UTI. The patient is currently taking abx for UTI. [name], a friend states that the patient is doing well except for the incontinence issues he has been experiencing. States he is incontinent of urine and stool. Has seen GI specialist in the past and recently the urologist. Is open to recommendations. Has follow up with the pcp on 02-16-2021. 04-18-2021: [name] states the patient is still having worse urinary incontinence Review with [name] that the patient has an upcoming appointment Monday with the specialist. [name] ask if the [facility] could call back tomorrow and help him remember this as he is not at home to write it on the calendar. The patient is having several accidents and [name] feels it is not better  at all. Education and support given.  Advised patient to call and get missed appointment rescheduled and the importance of follow up. 04-18-2021: Reviewed the importance of keeping scheduled appointments.  Provided education to patient re: monitoring for urinary incontinence and frequency  Discussed plans with patient for ongoing care management follow up and provided patient with direct contact information for care management team  Patient Goals/Self-Care Activities patient will:  - Patient will self administer medications as prescribed Patient will attend all scheduled provider appointments Patient will attend church or other social activities Patient will continue to perform ADL's independently Patient will continue to perform IADL's independently Patient will call provider office for new concerns or questions Patient will work with BSW to address care coordination needs and will continue to work with the clinical team to address health care and disease management related needs.   - nature and severity of incontinence assessed - use of voiding and symptom diary use promoted  Follow Up Plan: Telephone follow up appointment with care management team member scheduled for: 06-20-2021 at 0945 am        Care Plan : RNCM: Stroke (Adult)  Updates made by Vanita Ingles, RN since 04/18/2021 12:00 AM     Problem: RNCM: Emotional Adjustment to Disease (Stroke) and Brain Hydrocephalus syndrome   Priority: Medium     Long-Range Goal: RNCM: Optimal Coping: Stroke and Brain syndrome   Start Date: 07/28/2020  Expected End Date: 11/27/2021  This Visit's Progress: On track  Recent Progress: On track  Priority: Medium  Note:   Current Barriers:  Knowledge Deficits related to effective management of post CVA and brain hydrocephalus syndrome  Care Coordination needs related to community resource and support  in a patient with post CVA and Brain syndrome  Chronic Disease Management support and  education needs related to management of post CVA and Brain hydrocephalus syndrome  Lacks caregiver support.  Literacy barriers Non-adherence to scheduled provider appointments Non-adherence to prescribed medication regimen Cognitive Deficits Unable to independently manage care and is dependant on his brother and other to help with his ADLs/IADLS, appointments, and other needs Unable to self administer medications as prescribed Does not attend all scheduled provider appointments Does not adhere to prescribed medication regimen Lacks social connections Unable to perform IADLs independently Does not maintain contact with provider office Does not contact provider office for questions/concerns  Nurse Case Manager Clinical Goal(s):   patient will verbalize understanding of plan for effective management of post CVA and brain hydrocephalus syndrome   patient will work with [facility], CCM team, pcp, and specialist  to address needs related to changes in condition related to post CVA and brain hydrocephalus syndrome   patient will attend all scheduled medical appointments: upcoming appointment with the  pcp on 02-16-2021. Sees neurologist on a regular basis.   Interventions:  1:1 collaboration with Venita Lick, NP regarding development and update of comprehensive plan of care as evidenced by provider attestation and co-signature Inter-disciplinary care team collaboration (see longitudinal plan of care) Evaluation of current treatment plan related to post CVA care, and brain hydrocephalus syndrome  and patient's adherence to plan as established by provider. 09-08-2020: The patient saw the pcp and cardiologist this week. He is stable at this time and his brother is taking his blood pressures at home regularly. Denies any new concerns at this time. The patient corresponded with the RNCM by speaker phone with his brother today. Order has been submitted for a power chair/scooter by the pcp to [employer]  supply due to the patients mobility. The patient is using a cane when walking and PT has helped with strengthening. The patient has not had any new falls. Safety precautions reviewed with the patient and the patients brother. 02-14-2021: Spoke with [name], friend of the patient today. [name] states the patient has his wheelchair and he is very happy about that. He is needing a ramp right now. Will place a care guide referral for assistance in [facility] for a wheelchair ramp. Education provided that this may be an issue since the patient lives in an apartment and rents the apartment. [name] states he is working on helping the patient find a new place to live. The patient has an aide coming in to assist the patient. He cannot remember the agency she is from but states her name is Anne Ng.  [name] is also asking about a hospital bed for the patient. The patient has a hospital bed but [name] states it is worn out. Will collaborate with pcp. Per records the patient received a hospital bed in 2020 so do not know if the insurance will pay for a new bed. Will see what recommendations are. [name] states the patient is stable but he is working on some things so that the patient can stay out of assisted living. The patient wants to remain independent and living on his own. An APS report was filed but [name] states they could not find any reason to move forward with the report. 04-18-2021: The patient has no new needs at this time for management of post stroke. The patients brother did not have long to talk today and ask for a return call on 04-19-2021. Advised patient to call the provider for changes in condition or new quesitons  Provided education to patient re: safety in the home, evaluation of recent fall with ER visit. 04-18-2021: The patient remains safe in his home at present time. Denies any new falls.  Discussed plans with patient for ongoing care management follow up and provided patient with direct contact  information for care management team Reviewed scheduled/upcoming provider appointments including: with pcp 05-21-2021 at 2 pm  Patient Goals/Self-Care Activities  patient will:  - Patient will self administer medications as prescribed Patient will attend all scheduled provider appointments Patient will call pharmacy for medication refills Patient will continue to perform ADL's independently Patient will continue to perform IADL's independently Patient will call provider office for new concerns or questions Patient will work with BSW to address care coordination needs and will continue to work with the clinical team to address health care and disease management related needs.   - counseling provided - decision-making supported - depression screen reviewed - goal-setting facilitated - positive reinforcement provided - problem-solving  facilitated - self-care encouraged - self-reflection promoted - verbalization of feelings encouraged  Follow Up Plan: Telephone follow up appointment with care management team member scheduled for: 06-20-2021 at 0945 am         Care Plan : RNCM: Hypertension (Adult)  Updates made by Vanita Ingles, RN since 04/18/2021 12:00 AM     Problem: RNCM: Hypertension (Hypertension)   Priority: Medium     Long-Range Goal: RNCM: Hypertension Monitored   Start Date: 07/28/2020  Expected End Date: 02/09/2022  This Visit's Progress: On track  Recent Progress: On track  Priority: Medium  Note:   Objective:  Last practice recorded BP readings:  BP Readings from Last 3 Encounters:  03/05/21 121/84  02/16/21 128/77  02/05/21 126/82    Most recent eGFR/CrCl: No results found for: EGFR  No components found for: CRCL Current Barriers:  Knowledge Deficits related to basic understanding of hypertension pathophysiology and self care management Knowledge Deficits related to understanding of medications prescribed for management of hypertension Non-adherence to  prescribed medication regimen Non-adherence to scheduled provider appointments Transportation barriers Cognitive Deficits Limited Social Support Unable to independently manage HTN Unable to self administer medications as prescribed Does not attend all scheduled provider appointments Does not adhere to prescribed medication regimen Lacks social connections Unable to perform IADLs independently Does not maintain contact with provider office Does not contact provider office for questions/concerns Case Manager Clinical Goal(s):   patient will verbalize understanding of plan for hypertension management  patient will attend all scheduled medical appointments: 05-21-2021 at 2 pm with the pcp  patient will demonstrate improved adherence to prescribed treatment plan for hypertension as evidenced by taking all medications as prescribed, monitoring and recording blood pressure as directed, adhering to low sodium/DASH diet  patient will demonstrate improved health management independence as evidenced by checking blood pressure as directed and notifying PCP if SBP>160 or DBP > 90, taking all medications as prescribe, and adhering to a low sodium diet as discussed.  patient will verbalize basic understanding of hypertension disease process and self health management plan as evidenced by effective management of HTN in the home setting, continuing to take blood pressures at home and calling for changes in condition  Interventions:  Collaboration with Venita Lick, NP regarding development and update of comprehensive plan of care as evidenced by provider attestation and co-signature Inter-disciplinary care team collaboration (see longitudinal plan of care) Evaluation of current treatment plan related to hypertension self management and patient's adherence to plan as established by provider. 04-18-2021: The patient is doing well with HTN management. No new concerns related to HTN at this time. Will continue  to monitor.  Provided education to patient re: stroke prevention, s/s of heart attack and stroke, DASH diet, complications of uncontrolled blood pressure Reviewed medications with patient and discussed importance of compliance. 04-18-2021: The patient is compliant with medications Discussed plans with patient for ongoing care management follow up and provided patient with direct contact information for care management team Advised patient, providing education and rationale, to monitor blood pressure daily and record, calling PCP for findings outside established parameters. The brother is checking his blood pressure daily and recording. This am his blood pressure was 107/82. Praised for accomplishment of checking blood pressures in the home setting. 09-08-2020: Blood pressure readings at home for the last 3 readings: 160/100, 148/90, and 143/70. Praised for taking readings. Education on monitoring for blood pressures systolic >536 and diastolic >64.  The patients brother verbalized understanding.  Reviewed  scheduled/upcoming provider appointments including: 05-21-2021 at 2 pm Patient Goals/Self-Care Activities patient will:  - UNABLE to independently manage HTN Self administers medications as prescribed Attends all scheduled provider appointments Calls provider office for new concerns, questions, or BP outside discussed parameters Checks BP and records as discussed Follows a low sodium diet/DASH diet - blood pressure trends reviewed - depression screen reviewed - home or ambulatory blood pressure monitoring encouraged Follow Up Plan: Telephone follow up appointment with care management team member scheduled for: 06-20-2021 at 0945 am     Plan:Telephone follow up appointment with care management team member scheduled for:  06-20-2021 at [place] am  [place], MSN, [employer] [facility] Mobile: [phone]

## 2021-04-19 ENCOUNTER — Ambulatory Visit: Payer: Self-pay

## 2021-04-19 ENCOUNTER — Ambulatory Visit: Payer: Medicare Other | Admitting: Nurse Practitioner

## 2021-04-19 DIAGNOSIS — Z8673 Personal history of transient ischemic attack (TIA), and cerebral infarction without residual deficits: Secondary | ICD-10-CM

## 2021-04-19 DIAGNOSIS — N3941 Urge incontinence: Secondary | ICD-10-CM

## 2021-04-19 DIAGNOSIS — I152 Hypertension secondary to endocrine disorders: Secondary | ICD-10-CM

## 2021-04-19 DIAGNOSIS — G918 Other hydrocephalus: Secondary | ICD-10-CM

## 2021-04-19 DIAGNOSIS — E1159 Type 2 diabetes mellitus with other circulatory complications: Secondary | ICD-10-CM

## 2021-04-19 NOTE — Patient Instructions (Signed)
Visit Information  PATIENT GOALS:  Goals Addressed             This Visit's Progress    RNCM: Plan for Long Term Care-Stroke       Follow Up Date 06/20/2021    - check out stroke recovery classes in the community - check with a lawyer who knows elder law - check with a Forensic scientist - complete a Chief Technology Officer; include caregiver - hold a family meeting with family members and loved ones - check out other places when staying at home is no longer possible (assisted living center, nursing home) - check out services like in-home help or adult day care - list the symptoms that would make staying at home too hard - look at health insurance policy and other saving accounts so you know how much money you have - make a list future care and financial needs - make a list of people who can help and what they can do    Why is this important?   Having and recovering from a stroke can be scary and stressful.  You can reduce stress by planning.  Preparing for the future is one of the most important things to do.  Thinking about how much care you/your loved one will need and how much it will cost is not easy.  You/your loved one can be part of making decisions for the future.  Making sure that your/your loved one's wishes for care are known is important.     Notes: 02-14-2021: Talked with [name] who is a friend that helps with the patients care. They are trying to find a more suitable place for the patient to live. The patients brother lives with him and is his primary caregiver. They have filled out CAP paperwork and turned it in but have not heard from it. The patient sees the pcp on 02-16-2021 and [name] is asking for assistance with getting the patient a new hospital bed. Will send message to the pcp concerning the request. The patient also working with the LCSW. 05-19-2021: The patients brother states that he is eating and doing his normal activities. They are still trying to find a new place  to live. 04-19-2021: The patients brother had ask for a call back to make sure he had the appointment information down for the patient for his appointment on Monday with the urologist. The information provided for 04-23-2021, 330 pm appointment with the urologist at [facility]. [name] wrote down the information and will call for adequate transportation. Will continue to monitor.         Patient verbalizes understanding of instructions provided today and agrees to view in MyChart.   Telephone follow up appointment with care management team member scheduled for: 06-20-2021 at 0945 am  Alto Denver RN, MSN, CCM Community Care Coordinator [place]  [facility] Mobile: [phone]

## 2021-04-19 NOTE — Chronic Care Management (AMB) (Signed)
Chronic Care Management   CCM RN Visit Note  04/19/2021 Name: [name] MRN: [id number] DOB: December 31, 1970  Subjective: [name] is a 50 y.o. year old male who is a primary care patient of Cannady, Patrick Faster, NP. The care management team was consulted for assistance with disease management and care coordination needs.    Engaged with patient by telephone for follow up visit in response to provider referral for case management and/or care coordination services.   Consent to Services:  The patient was given information about Chronic Care Management services, agreed to services, and gave verbal consent prior to initiation of services.  Please see initial visit note for detailed documentation.   Patient agreed to services and verbal consent obtained.   Assessment: Review of patient past medical history, allergies, medications, health status, including review of consultants reports, laboratory and other test data, was performed as part of comprehensive evaluation and provision of chronic care management services.   SDOH (Social Determinants of Health) assessments and interventions performed:    CCM Care Plan  No Known Allergies  Outpatient Encounter Medications as of 04/19/2021  Medication Sig   amLODipine (NORVASC) 10 MG tablet Take 1 tablet (10 mg total) by mouth daily.   ASPIR-LOW 81 MG EC tablet TAKE 1 TABLET BY MOUTH ONCE DAILY   atorvastatin (LIPITOR) 80 MG tablet TAKE 1 TABLET BY MOUTH ONCE DAILY AT 6PM   Blood Glucose Monitoring Suppl ([place]) w/Device KIT Use to check blood sugar at least 2-3 times a day, in morning fasting and then 2 hours after a meal.   Blood Pressure Monitoring (BLOOD PRESSURE MONITOR AUTOMAT) DEVI Use to monitor blood pressure once daily.   carvedilol (COREG) 6.25 MG tablet TAKE 1 TABLET BY MOUTH TWICE DAILY WITH A MEAL   ciprofloxacin (CIPRO) 250 MG tablet Take 1 tablet (250 mg total) by mouth 2 (two) times daily.   citalopram  (CELEXA) 10 MG tablet Take 1 tablet (10 mg total) by mouth daily.   clopidogrel (PLAVIX) 75 MG tablet TAKE 1 TABLET BY MOUTH ONCE DAILY WITH BREAKFAST   donepezil (ARICEPT) 5 MG tablet Take 1 tablet by mouth daily.   gabapentin (NEURONTIN) 300 MG capsule Take 1 capsule (300 mg total) by mouth 3 (three) times daily.   glucose blood (ONETOUCH VERIO) test strip Use to check blood sugar at least 2-3 times a day, in morning fasting and 2 hours after a meal.   Lancet Devices (ACCU-CHEK SOFTCLIX) lancets Use as instructed   lisinopril (ZESTRIL) 5 MG tablet Take 1 tablet (5 mg total) by mouth daily.   metFORMIN (GLUCOPHAGE-XR) 500 MG 24 hr tablet Take 1 tablet (500 mg total) by mouth daily with breakfast.   mirabegron ER (MYRBETRIQ) 50 MG TB24 tablet Take 1 tablet (50 mg total) by mouth daily.   Multiple Vitamin (MULTIVITAMIN) tablet Take 1 tablet by mouth daily.   tamsulosin (FLOMAX) 0.4 MG CAPS capsule Take 1 capsule (0.4 mg total) by mouth daily.   trimethoprim (TRIMPEX) 100 MG tablet Take 1 tablet (100 mg total) by mouth daily.   trospium (SANCTURA) 20 MG tablet Take 1 tablet (20 mg total) by mouth 2 (two) times daily.   No facility-administered encounter medications on file as of 04/19/2021.    Patient Active Problem List   Diagnosis Date Noted   Poor mobility 09/06/2020   Type 2 diabetes mellitus with proteinuria ([place]) 12/31/2019   Incontinence of feces 12/10/2019   Urge incontinence of urine 12/10/2019  Chronic communicating hydrocephalus ([place]) 04/22/2019   Vitamin B12 deficiency 12/02/2018   Chronic pain of right ankle 12/18/2015   Obesity 10/16/2015   Hyperlipidemia associated with type 2 diabetes mellitus ([place]) 09/13/2015   History of stroke 12/17/2013   Hypertension associated with diabetes ([place]) 12/17/2013   Cardiomyopathy ([place]) 12/17/2013   Type 2 diabetes mellitus with diabetic polyneuropathy ([place]) 12/17/2013    Conditions to be addressed/monitored:HTN and history of stroke,  brain syndrome, and urinary Incontinence  Care Plan : RNCM: Urinary Incontinence (Adult)  Updates made by Vanita Ingles, RN since 04/19/2021 12:00 AM     Problem: RNCM: Disorder Identification (Urinary Incontinence)   Priority: High     Long-Range Goal: Urinary Incontinence Identified   Start Date: 07/28/2020  Expected End Date: 09/24/2021  This Visit's Progress: On track  Recent Progress: On track  Priority: High  Note:   Current Barriers:  Knowledge Deficits related to effective management of urge urinary incontinence  Chronic Disease Management support and education needs related to urinary incontinence  Lacks caregiver support.  Film/video editor.  Literacy barriers Transportation barriers Non-adherence to scheduled provider appointments Non-adherence to prescribed medication regimen Cognitive Deficits Unable to independently manage urinary incontinence  Unable to self administer medications as prescribed Does not attend all scheduled provider appointments Does not adhere to prescribed medication regimen Lacks social connections Unable to perform IADLs independently Does not maintain contact with provider office Does not contact provider office for questions/concerns  Nurse Case Manager Clinical Goal(s):  patient will verbalize understanding of plan for effective management of urinary incontinence  ,patient will work with RNCM, pcp, and specialist to address needs related to urinary incontience   patient will attend all scheduled medical appointments: patient had an appointment today to see specialist but had an "accident" and urinated on himself. [name] said he would have to reschedule his brothers appointment. Reminded him to make sure he rescheduled the appointment. He was calling after call with RNCM. 04-19-2021: The RNCM called [name] the patients brother and gave him information on the patients appointment for 04-23-2021 at 330 pm at [place]  patient will demonstrate improved adherence to prescribed treatment plan for urinary incontinence  as evidenced bycontrol of incontinence episodes, decrease in accidents and effective management  Interventions:  1:1 collaboration with Venita Lick, NP regarding development and update of comprehensive plan of care as evidenced by provider attestation and co-signature Inter-disciplinary care team collaboration (see longitudinal plan of care) Evaluation of current treatment plan related to urinary incontinence  and patient's adherence to plan as established by provider. 02-14-2021: The patient saw the urologist on 02-05-2021 for urge incontinence and UTI. The patient is currently taking abx for UTI. [name], a friend states that the patient is doing well except for the incontinence issues he has been experiencing. States he is incontinent of urine and stool. Has seen GI specialist in the past and recently the urologist. Is open to recommendations. Has follow up with the pcp on 02-16-2021. 04-18-2021: [name] states the patient is still having worse urinary incontinence Review with [name] that the patient has an upcoming appointment Monday with the specialist. [name] ask if the [facility] could call back tomorrow and help him remember this as he is not at home to write it on the calendar. The patient is having several accidents and [name] feels it is not better at all. Education and support given. 04-19-2021: The patients brother [name] says that the urinary incontinence is still a "big" problem.  He states sometimes he makes it to use the bathroom or his urinal and sometimes he doesn't.  He has a lot of "accidents".  He states he does not have an issue when he is sleeping but when he is awake it can sometimes come on quickly. The patients brother is concerned about this.  Education and support given.  Advised patient to call and get missed appointment rescheduled and the importance of follow up. 04-18-2021:  Reviewed the importance of keeping scheduled appointments. 04-19-2021: Provided the patients brother with appointment information for follow up with the urologist on 04-23-2021 at 3:30 pm. [name] is going to arrange transportation for the patient to get to the appointment. Also reminded [name] of appointment with the pcp in November.  Provided education to patient re: monitoring for urinary incontinence and frequency  Discussed plans with patient for ongoing care management follow up and provided patient with direct contact information for care management team  Patient Goals/Self-Care Activities patient will:  - Patient will self administer medications as prescribed Patient will attend all scheduled provider appointments Patient will attend church or other social activities Patient will continue to perform ADL's independently Patient will continue to perform IADL's independently Patient will call provider office for new concerns or questions Patient will work with BSW to address care coordination needs and will continue to work with the clinical team to address health care and disease management related needs.   - nature and severity of incontinence assessed - use of voiding and symptom diary use promoted  Follow Up Plan: Telephone follow up appointment with care management team member scheduled for: 06-20-2021 at 0945 am        Care Plan : RNCM: Stroke (Adult)  Updates made by Vanita Ingles, RN since 04/19/2021 12:00 AM     Problem: RNCM: Emotional Adjustment to Disease (Stroke) and Brain Hydrocephalus syndrome   Priority: Medium     Long-Range Goal: RNCM: Optimal Coping: Stroke and Brain syndrome   Start Date: 07/28/2020  Expected End Date: 11/27/2021  This Visit's Progress: On track  Recent Progress: On track  Priority: Medium  Note:   Current Barriers:  Knowledge Deficits related to effective management of post CVA and brain hydrocephalus syndrome  Care Coordination needs  related to community resource and support  in a patient with post CVA and Brain syndrome  Chronic Disease Management support and education needs related to management of post CVA and Brain hydrocephalus syndrome  Lacks caregiver support.  Literacy barriers Non-adherence to scheduled provider appointments Non-adherence to prescribed medication regimen Cognitive Deficits Unable to independently manage care and is dependant on his brother and other to help with his ADLs/IADLS, appointments, and other needs Unable to self administer medications as prescribed Does not attend all scheduled provider appointments Does not adhere to prescribed medication regimen Lacks social connections Unable to perform IADLs independently Does not maintain contact with provider office Does not contact provider office for questions/concerns  Nurse Case Manager Clinical Goal(s):   patient will verbalize understanding of plan for effective management of post CVA and brain hydrocephalus syndrome   patient will work with [facility], CCM team, pcp, and specialist  to address needs related to changes in condition related to post CVA and brain hydrocephalus syndrome   patient will attend all scheduled medical appointments: upcoming appointment with the pcp on 02-16-2021. Sees neurologist on a regular basis.   Interventions:  1:1 collaboration with Venita Lick, NP regarding development and update of comprehensive plan of care  as evidenced by provider attestation and co-signature Inter-disciplinary care team collaboration (see longitudinal plan of care) Evaluation of current treatment plan related to post CVA care, and brain hydrocephalus syndrome  and patient's adherence to plan as established by provider. 09-08-2020: The patient saw the pcp and cardiologist this week. He is stable at this time and his brother is taking his blood pressures at home regularly. Denies any new concerns at this time. The patient corresponded with  the RNCM by speaker phone with his brother today. Order has been submitted for a power chair/scooter by the pcp to [employer] supply due to the patients mobility. The patient is using a cane when walking and PT has helped with strengthening. The patient has not had any new falls. Safety precautions reviewed with the patient and the patients brother. 02-14-2021: Spoke with [name], friend of the patient today. [name] states the patient has his wheelchair and he is very happy about that. He is needing a ramp right now. Will place a care guide referral for assistance in [facility] for a wheelchair ramp. Education provided that this may be an issue since the patient lives in an apartment and rents the apartment. [name] states he is working on helping the patient find a new place to live. The patient has an aide coming in to assist the patient. He cannot remember the agency she is from but states her name is Anne Ng.  [name] is also asking about a hospital bed for the patient. The patient has a hospital bed but [name] states it is worn out. Will collaborate with pcp. Per records the patient received a hospital bed in 2020 so do not know if the insurance will pay for a new bed. Will see what recommendations are. [name] states the patient is stable but he is working on some things so that the patient can stay out of assisted living. The patient wants to remain independent and living on his own. An APS report was filed but [name] states they could not find any reason to move forward with the report. 04-18-2021: The patient has no new needs at this time for management of post stroke. The patients brother did not have long to talk today and ask for a return call on 04-19-2021. 04-19-2021: Spoke with [name] and made sure he had information for the [place] for the patients upcoming appointment on 04-23-2021. Education provided on setting up transportation and making sure the patient went to his  appointment since he was still having issues with urinary incontinence. Will continue to monitor for needs.  Advised patient to call the provider for changes in condition or new quesitons  Provided education to patient re: safety in the home, evaluation of recent fall with ER visit. 04-18-2021: The patient remains safe in his home at present time. Denies any new falls.  Discussed plans with patient for ongoing care management follow up and provided patient with direct contact information for care management team Reviewed scheduled/upcoming provider appointments including: with pcp 05-21-2021 at 2 pm  Patient Goals/Self-Care Activities  patient will:  - Patient will self administer medications as prescribed Patient will attend all scheduled provider appointments Patient will call pharmacy for medication refills Patient will continue to perform ADL's independently Patient will continue to perform IADL's independently Patient will call provider office for new concerns or questions Patient will work with BSW to address care coordination needs and will continue to work with the clinical team to address health care and disease  management related needs.   - counseling provided - decision-making supported - depression screen reviewed - goal-setting facilitated - positive reinforcement provided - problem-solving facilitated - self-care encouraged - self-reflection promoted - verbalization of feelings encouraged  Follow Up Plan: Telephone follow up appointment with care management team member scheduled for: 06-20-2021 at 0945 am         Care Plan : RNCM: Hypertension (Adult)  Updates made by Vanita Ingles, RN since 04/19/2021 12:00 AM     Problem: RNCM: Hypertension (Hypertension)   Priority: Medium     Long-Range Goal: RNCM: Hypertension Monitored   Start Date: 07/28/2020  Expected End Date: 02/09/2022  This Visit's Progress: On track  Recent Progress: On track  Priority: Medium   Note:   Objective:  Last practice recorded BP readings:  BP Readings from Last 3 Encounters:  03/05/21 121/84  02/16/21 128/77  02/05/21 126/82    Most recent eGFR/CrCl: No results found for: EGFR  No components found for: CRCL Current Barriers:  Knowledge Deficits related to basic understanding of hypertension pathophysiology and self care management Knowledge Deficits related to understanding of medications prescribed for management of hypertension Non-adherence to prescribed medication regimen Non-adherence to scheduled provider appointments Transportation barriers Cognitive Deficits Limited Social Support Unable to independently manage HTN Unable to self administer medications as prescribed Does not attend all scheduled provider appointments Does not adhere to prescribed medication regimen Lacks social connections Unable to perform IADLs independently Does not maintain contact with provider office Does not contact provider office for questions/concerns Case Manager Clinical Goal(s):   patient will verbalize understanding of plan for hypertension management  patient will attend all scheduled medical appointments: 05-21-2021 at 2 pm with the pcp  patient will demonstrate improved adherence to prescribed treatment plan for hypertension as evidenced by taking all medications as prescribed, monitoring and recording blood pressure as directed, adhering to low sodium/DASH diet  patient will demonstrate improved health management independence as evidenced by checking blood pressure as directed and notifying PCP if SBP>160 or DBP > 90, taking all medications as prescribe, and adhering to a low sodium diet as discussed.  patient will verbalize basic understanding of hypertension disease process and self health management plan as evidenced by effective management of HTN in the home setting, continuing to take blood pressures at home and calling for changes in condition  Interventions:   Collaboration with Venita Lick, NP regarding development and update of comprehensive plan of care as evidenced by provider attestation and co-signature Inter-disciplinary care team collaboration (see longitudinal plan of care) Evaluation of current treatment plan related to hypertension self management and patient's adherence to plan as established by provider. 04-18-2021: The patient is doing well with HTN management. No new concerns related to HTN at this time. Will continue to monitor.  Provided education to patient re: stroke prevention, s/s of heart attack and stroke, DASH diet, complications of uncontrolled blood pressure Reviewed medications with patient and discussed importance of compliance. 04-19-2021: The patient is compliant with medications Discussed plans with patient for ongoing care management follow up and provided patient with direct contact information for care management team Advised patient, providing education and rationale, to monitor blood pressure daily and record, calling PCP for findings outside established parameters. The brother is checking his blood pressure daily and recording. This am his blood pressure was 107/82. Praised for accomplishment of checking blood pressures in the home setting. 09-08-2020: Blood pressure readings at home for the last 3 readings: 160/100, 148/90, and  143/70. Praised for taking readings. Education on monitoring for blood pressures systolic >466 and diastolic >59.  The patients brother verbalized understanding. 04-19-2021: Reviewed with the patients brother the patients blood pressure readings. The patients brother did not get specific readings but states it has been within normal limits. The patient is eating well and sleeping well.  Reviewed scheduled/upcoming provider appointments including: 05-21-2021 at 2 pm Patient Goals/Self-Care Activities patient will:  - UNABLE to independently manage HTN Self administers medications as  prescribed Attends all scheduled provider appointments Calls provider office for new concerns, questions, or BP outside discussed parameters Checks BP and records as discussed Follows a low sodium diet/DASH diet - blood pressure trends reviewed - depression screen reviewed - home or ambulatory blood pressure monitoring encouraged Follow Up Plan: Telephone follow up appointment with care management team member scheduled for: 06-20-2021 at 0945 am     Plan:Telephone follow up appointment with care management team member scheduled for:  06-20-2021 at [place] am  [employer], MSN, [facility] Mobile: [phone]

## 2021-04-23 ENCOUNTER — Ambulatory Visit (INDEPENDENT_AMBULATORY_CARE_PROVIDER_SITE_OTHER): Payer: Medicare Other | Admitting: Urology

## 2021-04-23 ENCOUNTER — Other Ambulatory Visit: Payer: Self-pay

## 2021-04-23 VITALS — BP 122/80 | HR 70

## 2021-04-23 DIAGNOSIS — N3946 Mixed incontinence: Secondary | ICD-10-CM | POA: Diagnosis not present

## 2021-04-23 DIAGNOSIS — I639 Cerebral infarction, unspecified: Secondary | ICD-10-CM

## 2021-04-23 NOTE — Progress Notes (Signed)
04/23/2021 3:46 PM   [name] [birth year] [id number]  Referring provider: Venita Lick, NP [address] [place],  [place] [address]  Chief Complaint  Patient presents with   Urinary Incontinence    HPI: I was consulted to assist the patient's urgency incontinence over the last 2 years.  He has had a stroke and utilizes a cane and walks slowly.  No stress incontinence or bedwetting.  He wears 2 pads a day that can be damp or soaked.   Patient said his flow was reasonable and he feels empty   Reassess in 6 weeks with a residual on Flomax.  Patient aware there is many ways of helping him.  Mild elevated residual of 115 may be false positive.   Urgency much better.  Urge incontinence improved.  Very pleased.     Patient came in today 1 week later now with urge incontinence.  Urine might be strong smelling.  He could not leave a sample but his family or friend will bring back one today and will send it for culture.  I will add Myrbetriq.  They were asking about a condom catheter eventually and it may be needed.     Reassess in 6 weeks on Myrbetriq 50 mg samples and prescription and Flomax.  Patient triggering is going from sitting to standing position.  At patient request I gave a box of condom catheters with the prescription.  Hoping to get him nearly dry as well   Last culture positive Urgency incontinence worse.  They tried condom catheters and they fall off.  It is difficult to say but the patient based on chart review and today may or may not have gotten the antibiotic.  Difficult to say if he is clinically infected today    Based on last culture I called and ciprofloxacin 250 mg twice a day for 1 week.  He will then go on daily suppression therapy trimethoprim 100 mg 3x11.  Stay on Myrbetriq and Flomax.  Reassess in 8 weeks.  Percutaneous tibial nerve stimulation is an option.  Timed voiding and reasonable goals is important  Today Trospium was added to the Flomax and  Myrbetriq by nurse practitioner.  Was considering a Foley catheter.  The patient can use a cane.  I had him stop the trospium and Myrbetriq since is not working.  I do not think he still on Flomax and I will check next time.  Clinically not infected   PMH: Past Medical History:  Diagnosis Date   Depression    Diabetes mellitus without complication ([place])    Hydrocephalus in adult [facility])    Hyperlipidemia    Hypertension    Seizures ([place])    Stroke [facility])     Surgical History: Past Surgical History:  Procedure Laterality Date   COLONOSCOPY WITH PROPOFOL N/A 03/08/2020   Procedure: COLONOSCOPY WITH PROPOFOL;  Surgeon: Virgel Manifold, MD;  Location: [facility] ENDOSCOPY;  Service: Endoscopy;  Laterality: N/A;   LOOP RECORDER IMPLANT  12/20/13   MDT LinQ implanted by Dr Lovena Le for cryptogenic stroke   LOOP RECORDER IMPLANT N/A 12/20/2013   Procedure: LOOP RECORDER IMPLANT;  Surgeon: Evans Lance, MD;  Location: [facility] CATH LAB;  Service: Cardiovascular;  Laterality: N/A;    Home Medications:  Allergies as of 04/23/2021   No Known Allergies      Medication List        Accurate as of April 23, 2021  3:46 PM. If you have any  questions, ask your nurse or doctor.          STOP taking these medications    ciprofloxacin 250 MG tablet Commonly known as: Cipro Stopped by: Reece Packer, MD   mirabegron ER 50 MG Tb24 tablet Commonly known as: MYRBETRIQ Stopped by: Reece Packer, MD   tamsulosin 0.4 MG Caps capsule Commonly known as: FLOMAX Stopped by: Reece Packer, MD   trimethoprim 100 MG tablet Commonly known as: TRIMPEX Stopped by: Reece Packer, MD   trospium 20 MG tablet Commonly known as: SANCTURA Stopped by: Reece Packer, MD       TAKE these medications    accu-chek softclix lancets Use as instructed   amLODipine 10 MG tablet Commonly known as: NORVASC Take 1 tablet (10 mg total) by mouth daily.   Aspir-Low 81 MG EC  tablet Generic drug: aspirin TAKE 1 TABLET BY MOUTH ONCE DAILY   atorvastatin 80 MG tablet Commonly known as: LIPITOR TAKE 1 TABLET BY MOUTH ONCE DAILY AT 6PM   Blood Pressure Monitor Automat Devi Use to monitor blood pressure once daily.   carvedilol 6.25 MG tablet Commonly known as: COREG TAKE 1 TABLET BY MOUTH TWICE DAILY WITH A MEAL   citalopram 10 MG tablet Commonly known as: CELEXA Take 1 tablet (10 mg total) by mouth daily.   clopidogrel 75 MG tablet Commonly known as: PLAVIX TAKE 1 TABLET BY MOUTH ONCE DAILY WITH BREAKFAST   donepezil 5 MG tablet Commonly known as: ARICEPT Take 1 tablet by mouth daily.   gabapentin 300 MG capsule Commonly known as: NEURONTIN Take 1 capsule (300 mg total) by mouth 3 (three) times daily.   lisinopril 5 MG tablet Commonly known as: ZESTRIL Take 1 tablet (5 mg total) by mouth daily.   metFORMIN 500 MG 24 hr tablet Commonly known as: GLUCOPHAGE-XR Take 1 tablet (500 mg total) by mouth daily with breakfast.   multivitamin tablet Take 1 tablet by mouth daily.   OneTouch Verio Flex System w/Device Kit Use to check blood sugar at least 2-3 times a day, in morning fasting and then 2 hours after a meal.   OneTouch Verio test strip Generic drug: glucose blood Use to check blood sugar at least 2-3 times a day, in morning fasting and 2 hours after a meal.        Allergies: No Known Allergies  Family History: Family History  Problem Relation Age of Onset   Stroke Father    Heart disease Father    Hypertension Father    Hypertension Mother    Diabetes Mother    Arthritis Brother    Diabetes Maternal Grandfather     Social History:  reports that he has quit smoking. He has never used smokeless tobacco. He reports current alcohol use of about 9.0 standard drinks per week. He reports that he does not use drugs.  ROS:                                        Physical Exam: BP 122/80   Pulse 70    Constitutional:  Alert and oriented, No acute distress. HEENT: [place] AT, moist mucus membranes.  Trachea midline, no masses.  Laboratory Data: Lab Results  Component Value Date   WBC 4.2 07/13/2020   HGB 13.6 07/13/2020   HCT 41.4 07/13/2020   MCV 94.1 07/13/2020   PLT 205 07/13/2020  Lab Results  Component Value Date   CREATININE 1.06 02/16/2021    No results found for: PSA  No results found for: TESTOSTERONE  Lab Results  Component Value Date   HGBA1C 5.5 02/16/2021    Urinalysis    Component Value Date/Time   COLORURINE YELLOW (A) 07/13/2020 1313   APPEARANCEUR Hazy (A) 03/05/2021 1156   LABSPEC 1.018 07/13/2020 1313   LABSPEC 1.010 12/14/2013 0959   PHURINE 7.0 07/13/2020 1313   GLUCOSEU Negative 03/05/2021 1156   GLUCOSEU Negative 12/14/2013 0959   HGBUR NEGATIVE 07/13/2020 1313   BILIRUBINUR Negative 03/05/2021 1156   BILIRUBINUR Negative 12/14/2013 0959   KETONESUR NEGATIVE 07/13/2020 1313   PROTEINUR 1+ (A) 03/05/2021 1156   PROTEINUR 100 (A) 07/13/2020 1313   UROBILINOGEN 1.0 12/22/2013 1648   NITRITE Negative 03/05/2021 1156   NITRITE NEGATIVE 07/13/2020 1313   LEUKOCYTESUR Negative 03/05/2021 1156   LEUKOCYTESUR NEGATIVE 07/13/2020 1313   LEUKOCYTESUR Negative 12/14/2013 0959    Pertinent Imaging: Reassess on the new beta 3 agonist in 5 weeks samples given.  Botox is a distant option but high risk of retention.  Assessment & Plan: Noted above.  Percutaneous tibial nerve stimulation could benefit and will be recommended  There are no diagnoses linked to this encounter.  No follow-ups on file.  Reece Packer, MD  [facility] [address] [place], [place] [address] [phone]

## 2021-04-24 ENCOUNTER — Ambulatory Visit (INDEPENDENT_AMBULATORY_CARE_PROVIDER_SITE_OTHER): Payer: Medicare Other | Admitting: Nurse Practitioner

## 2021-04-24 ENCOUNTER — Other Ambulatory Visit: Payer: Self-pay

## 2021-04-24 ENCOUNTER — Encounter: Payer: Self-pay | Admitting: Nurse Practitioner

## 2021-04-24 VITALS — BP 119/75 | HR 64 | Temp 98.3°F | Wt 272.0 lb

## 2021-04-24 DIAGNOSIS — E1142 Type 2 diabetes mellitus with diabetic polyneuropathy: Secondary | ICD-10-CM

## 2021-04-24 DIAGNOSIS — M79675 Pain in left toe(s): Secondary | ICD-10-CM

## 2021-04-24 DIAGNOSIS — I639 Cerebral infarction, unspecified: Secondary | ICD-10-CM

## 2021-04-24 MED ORDER — ACETAMINOPHEN 325 MG PO TABS: 650.0000 mg | ORAL_TABLET | Freq: Four times a day (QID) | ORAL | 0 refills | Status: DC | PRN

## 2021-04-24 MED ORDER — MUPIROCIN CALCIUM 2 % EX CREA: 1.0000 "application " | TOPICAL_CREAM | Freq: Two times a day (BID) | CUTANEOUS | 0 refills | Status: DC

## 2021-04-24 NOTE — Assessment & Plan Note (Signed)
He has a custom made pair of shoes from prior podiatrist. He would like his toenails trimmed and a possible new pair of shoes. Will place referral to podiatry.

## 2021-04-24 NOTE — Patient Instructions (Addendum)
Wash foot with soap and water daily and then apply a pea sized amount of bactroban ointment to the bottom of your nail for 1 week.   You can take 2 tylenol every 6 hours as needed for pain.

## 2021-04-24 NOTE — Progress Notes (Signed)
Acute Office Visit  Subjective:    Patient ID: [name], male    DOB: 06-09-1971, 50 y.o.   MRN: [id number]  Chief Complaint  Patient presents with   Toe Pain    Big toe on left foot, stumped toe two weeks ago,    HPI Patient is in today for left great toe pain. He stubbed his toe but doesn't fully remember when. He states that he has a throbbing pain that comes and goes. He is able to walk without issues. He states that he needs his toe nails trimmed. His brother is in the room, providing some of the history. He denies redness, swelling, and fevers.   Past Medical History:  Diagnosis Date   Depression    Diabetes mellitus without complication ([place])    Hydrocephalus in adult [facility])    Hyperlipidemia    Hypertension    Seizures ([place])    Stroke [facility])     Past Surgical History:  Procedure Laterality Date   COLONOSCOPY WITH PROPOFOL N/A 03/08/2020   Procedure: COLONOSCOPY WITH PROPOFOL;  Surgeon: Virgel Manifold, MD;  Location: [facility] ENDOSCOPY;  Service: Endoscopy;  Laterality: N/A;   LOOP RECORDER IMPLANT  12/20/13   MDT LinQ implanted by Dr Lovena Le for cryptogenic stroke   LOOP RECORDER IMPLANT N/A 12/20/2013   Procedure: LOOP RECORDER IMPLANT;  Surgeon: Evans Lance, MD;  Location: [facility] CATH LAB;  Service: Cardiovascular;  Laterality: N/A;    Family History  Problem Relation Age of Onset   Stroke Father    Heart disease Father    Hypertension Father    Hypertension Mother    Diabetes Mother    Arthritis Brother    Diabetes Maternal Grandfather     Social History   Socioeconomic History   Marital status: Single    Spouse name: Not on file   Number of children: Not on file   Years of education: Not on file   Highest education level: Not on file  Occupational History   Not on file  Tobacco Use   Smoking status: Former   Smokeless tobacco: Never   Tobacco comments:    > 4 years quit  Vaping Use   Vaping Use: Never used  Substance and Sexual Activity    Alcohol use: Yes    Alcohol/week: 9.0 standard drinks    Types: 7 Cans of beer, 2 Standard drinks or equivalent per week    Comment: in a week   Drug use: No   Sexual activity: Not Currently  Other Topics Concern   Not on file  Social History Narrative   Not on file   Social Determinants of Health   Financial Resource Strain: Medium Risk   Difficulty of Paying Living Expenses: Somewhat hard  Food Insecurity: No Food Insecurity   Worried About Charity fundraiser in the Last Year: Never true   Ran Out of Food in the Last Year: Never true  Transportation Needs: No Transportation Needs   Lack of Transportation (Medical): No   Lack of Transportation (Non-Medical): No  Physical Activity: Inactive   Days of Exercise per Week: 0 days   Minutes of Exercise per Session: 0 min  Stress: No Stress Concern Present   Feeling of Stress : Not at all  Social Connections: Moderately Integrated   Frequency of Communication with Friends and Family: More than three times a week   Frequency of Social Gatherings with Friends and Family: More than three times a week  Attends Religious Services: More than 4 times per year   Active Member of Clubs or Organizations: Yes   Attends Archivist Meetings: More than 4 times per year   Marital Status: Never married  Human resources officer Violence: Not At Risk   Fear of Current or Ex-Partner: No   Emotionally Abused: No   Physically Abused: No   Sexually Abused: No    Outpatient Medications Prior to Visit  Medication Sig Dispense Refill   amLODipine (NORVASC) 10 MG tablet Take 1 tablet (10 mg total) by mouth daily. 90 tablet 4   ASPIR-LOW 81 MG EC tablet TAKE 1 TABLET BY MOUTH ONCE DAILY 30 tablet 0   atorvastatin (LIPITOR) 80 MG tablet TAKE 1 TABLET BY MOUTH ONCE DAILY AT 6PM 90 tablet 4   Blood Glucose Monitoring Suppl ([place]) w/Device KIT Use to check blood sugar at least 2-3 times a day, in morning fasting and then 2 hours  after a meal. 1 kit 1   Blood Pressure Monitoring (BLOOD PRESSURE MONITOR AUTOMAT) DEVI Use to monitor blood pressure once daily. 1 each 1   carvedilol (COREG) 6.25 MG tablet TAKE 1 TABLET BY MOUTH TWICE DAILY WITH A MEAL 180 tablet 4   citalopram (CELEXA) 10 MG tablet Take 1 tablet (10 mg total) by mouth daily. 90 tablet 4   clopidogrel (PLAVIX) 75 MG tablet TAKE 1 TABLET BY MOUTH ONCE DAILY WITH BREAKFAST 90 tablet 4   donepezil (ARICEPT) 5 MG tablet Take 1 tablet by mouth daily.     gabapentin (NEURONTIN) 300 MG capsule Take 1 capsule (300 mg total) by mouth 3 (three) times daily. 270 capsule 4   glucose blood (ONETOUCH VERIO) test strip Use to check blood sugar at least 2-3 times a day, in morning fasting and 2 hours after a meal. 100 each 12   Lancet Devices (ACCU-CHEK SOFTCLIX) lancets Use as instructed 1 each 0   lisinopril (ZESTRIL) 5 MG tablet Take 1 tablet (5 mg total) by mouth daily. 90 tablet 4   metFORMIN (GLUCOPHAGE-XR) 500 MG 24 hr tablet Take 1 tablet (500 mg total) by mouth daily with breakfast. 90 tablet 4   Multiple Vitamin (MULTIVITAMIN) tablet Take 1 tablet by mouth daily.     No facility-administered medications prior to visit.    No Known Allergies  Review of Systems  Constitutional: Negative.   Respiratory: Negative.    Cardiovascular: Negative.   Musculoskeletal:  Positive for arthralgias (left great toe).  Skin: Negative.   Neurological: Negative.       Objective:    Physical Exam Vitals and nursing note reviewed.  Constitutional:      Appearance: Normal appearance.  HENT:     Head: Normocephalic.  Eyes:     Conjunctiva/sclera: Conjunctivae normal.  Cardiovascular:     Rate and Rhythm: Normal rate.     Pulses: Normal pulses.  Pulmonary:     Effort: Pulmonary effort is normal.  Musculoskeletal:        General: No swelling or tenderness.     Cervical back: Normal range of motion.     Comments: No edema or redness to left great toe. There is a small  area of open skin near the bottom of his nail. No drainage or signs of infection  Skin:    General: Skin is warm.  Neurological:     General: No focal deficit present.     Mental Status: He is alert and oriented to person, place, and  time.  Psychiatric:        Mood and Affect: Mood normal.        Behavior: Behavior normal.        Thought Content: Thought content normal.        Judgment: Judgment normal.    BP 119/75   Pulse 64   Temp 98.3 F (36.8 C) (Oral)   Wt 272 lb (123.4 kg)   SpO2 98%   BMI 35.89 kg/m  Wt Readings from Last 3 Encounters:  04/24/21 272 lb (123.4 kg)  02/16/21 270 lb (122.5 kg)  02/05/21 270 lb (122.5 kg)    Health Maintenance Due  Topic Date Due   COVID-19 Vaccine (3 - Booster for Pfizer series) 05/25/2020   INFLUENZA VACCINE  02/05/2021    There are no preventive care reminders to display for this patient.   Lab Results  Component Value Date   TSH 0.793 07/18/2020   Lab Results  Component Value Date   WBC 4.2 07/13/2020   HGB 13.6 07/13/2020   HCT 41.4 07/13/2020   MCV 94.1 07/13/2020   PLT 205 07/13/2020   Lab Results  Component Value Date   NA 137 02/16/2021   K 4.9 02/16/2021   CO2 21 02/16/2021   GLUCOSE 90 02/16/2021   BUN 10 02/16/2021   CREATININE 1.06 02/16/2021   BILITOT 0.8 07/18/2020   ALKPHOS 71 07/18/2020   AST 17 07/18/2020   ALT 18 07/18/2020   PROT 7.4 07/18/2020   ALBUMIN 4.6 07/18/2020   CALCIUM 10.1 02/16/2021   ANIONGAP 12 07/13/2020   EGFR 87 02/16/2021   Lab Results  Component Value Date   CHOL 113 02/16/2021   Lab Results  Component Value Date   HDL 61 02/16/2021   Lab Results  Component Value Date   LDLCALC 39 02/16/2021   Lab Results  Component Value Date   TRIG 56 02/16/2021   Lab Results  Component Value Date   CHOLHDL 2.5 10/11/2015   Lab Results  Component Value Date   HGBA1C 5.5 02/16/2021       Assessment & Plan:   Problem List Items Addressed This Visit        Endocrine   Type 2 diabetes mellitus with diabetic polyneuropathy ([place])    He has a custom made pair of shoes from prior podiatrist. He would like his toenails trimmed and a possible new pair of shoes. Will place referral to podiatry.       Relevant Orders   Ambulatory referral to Podiatry   Other Visit Diagnoses     Great toe pain, left    -  Primary   No signs of infection, will have him wash with soap and water daily, pat dry and apply small amount of mupriocin. Tylenol prn pain. F/U for worsening symptoms.         Meds ordered this encounter  Medications   mupirocin cream (BACTROBAN) 2 %    Sig: Apply 1 application topically 2 (two) times daily.    Dispense:  15 g    Refill:  0   acetaminophen (TYLENOL) 325 MG tablet    Sig: Take 2 tablets (650 mg total) by mouth every 6 (six) hours as needed for mild pain.    Dispense:  30 tablet    Refill:  0     Charyl Dancer, NP

## 2021-04-26 ENCOUNTER — Ambulatory Visit: Payer: Medicare Other | Admitting: Podiatry

## 2021-05-07 DIAGNOSIS — E1159 Type 2 diabetes mellitus with other circulatory complications: Secondary | ICD-10-CM | POA: Diagnosis not present

## 2021-05-07 DIAGNOSIS — E1169 Type 2 diabetes mellitus with other specified complication: Secondary | ICD-10-CM | POA: Diagnosis not present

## 2021-05-07 DIAGNOSIS — I152 Hypertension secondary to endocrine disorders: Secondary | ICD-10-CM

## 2021-05-07 DIAGNOSIS — I10 Essential (primary) hypertension: Secondary | ICD-10-CM | POA: Diagnosis not present

## 2021-05-07 DIAGNOSIS — E785 Hyperlipidemia, unspecified: Secondary | ICD-10-CM | POA: Diagnosis not present

## 2021-05-21 ENCOUNTER — Ambulatory Visit: Payer: Medicare Other | Admitting: Nurse Practitioner

## 2021-05-21 DIAGNOSIS — E1142 Type 2 diabetes mellitus with diabetic polyneuropathy: Secondary | ICD-10-CM

## 2021-05-21 DIAGNOSIS — E1169 Type 2 diabetes mellitus with other specified complication: Secondary | ICD-10-CM

## 2021-05-21 DIAGNOSIS — E1159 Type 2 diabetes mellitus with other circulatory complications: Secondary | ICD-10-CM

## 2021-05-22 IMAGING — CT CT HEAD W/O CM
3 series · 14 of 47 positions shown, 16 images · non-contrast
Comparison: April 04, 2016

CLINICAL DATA: Dizziness and gait disturbance

EXAM:
CT HEAD WITHOUT CONTRAST
TECHNIQUE: Contiguous axial images were obtained from the base of the skull
through the vertex without intravenous contrast.

[Series 2: head wo · axial · 0.47mm/px · z∈[-127,+8]mm · 8 of 33 slices shown, 10 images]
[im 3/33  brain]
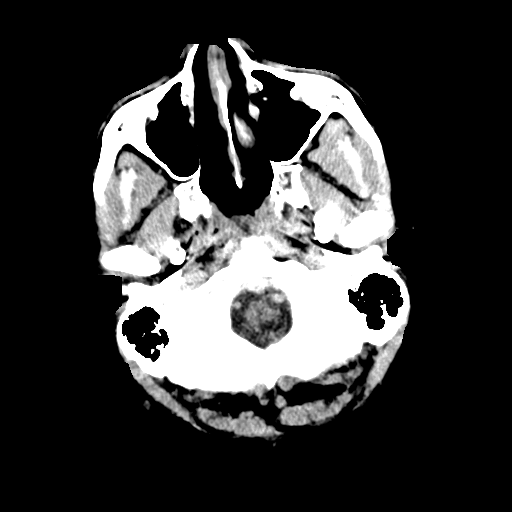
[im 3/33  bone]
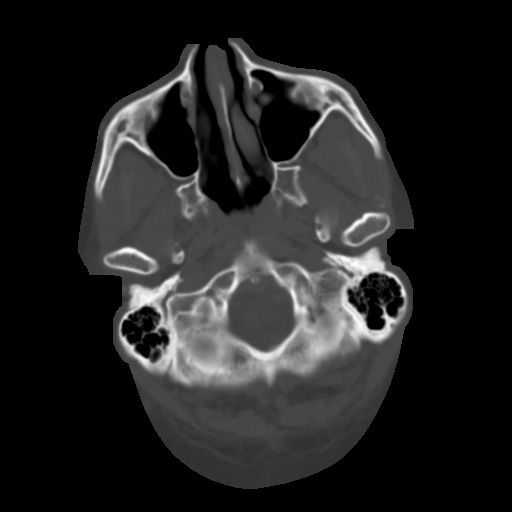
[im 7/33  brain]
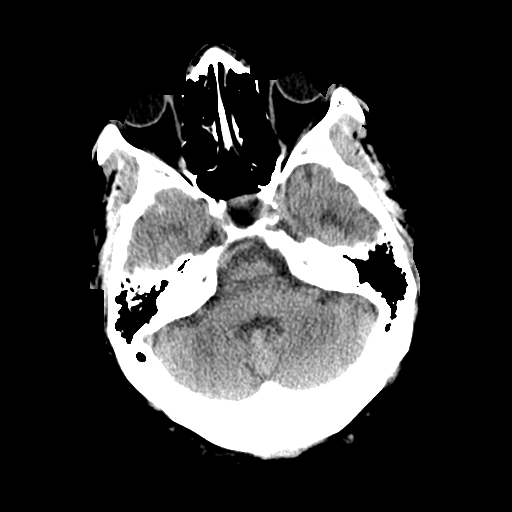
[im 10/33  brain]
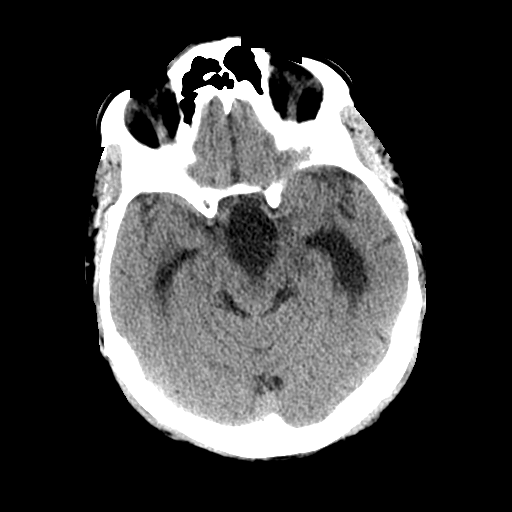
[im 15/33  brain]
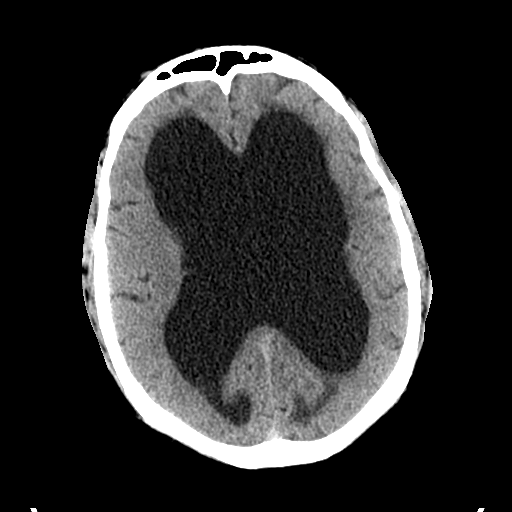
[im 18/33  brain]
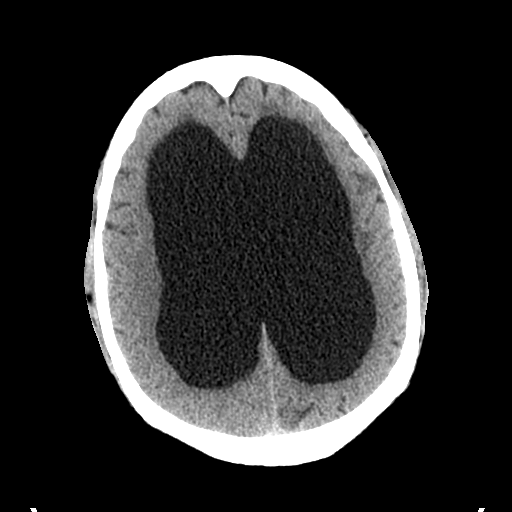
[im 18/33  bone]
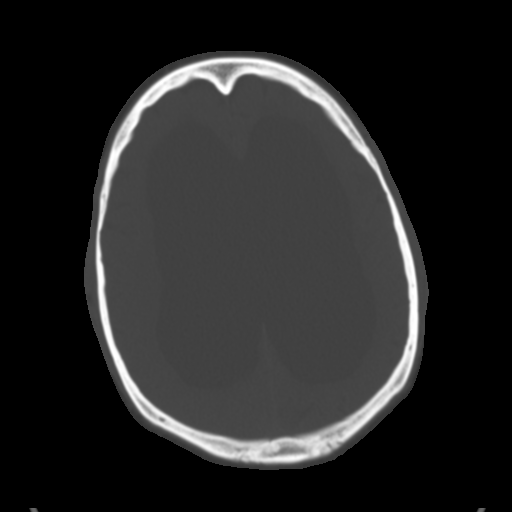
[im 23/33  brain]
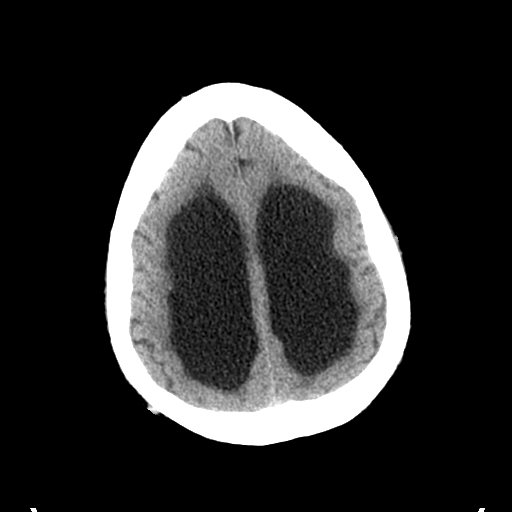
[im 26/33  brain]
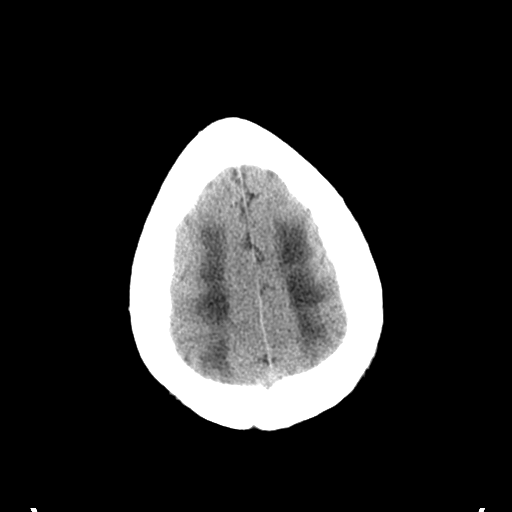
[im 30/33  brain]
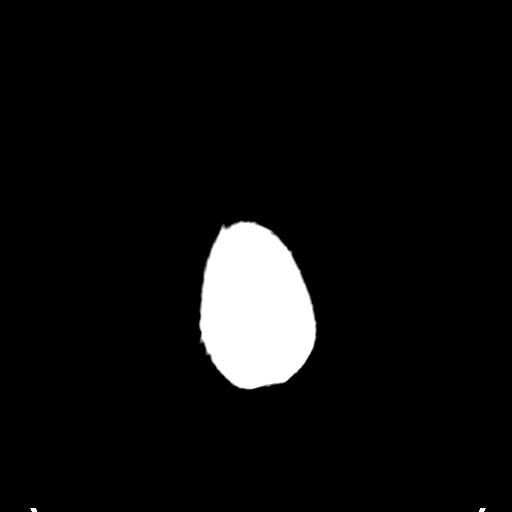

[Series 4: coronal soft tissue · coronal · 0.29mm/px · 3 of 77 slices shown]
[im 30/77  brain]
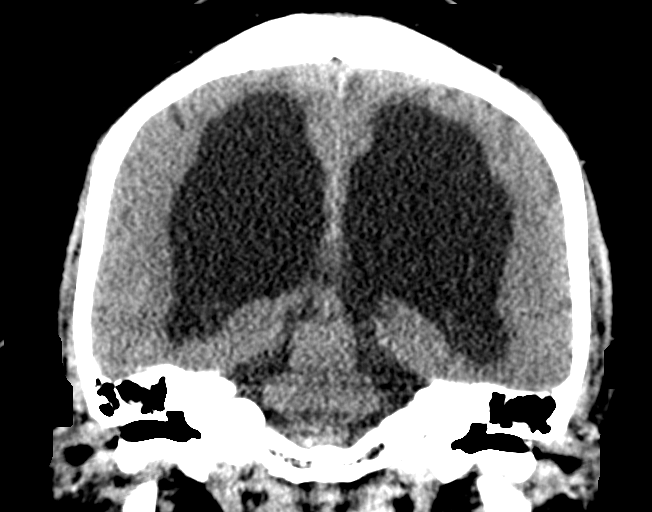
[im 36/77  brain]
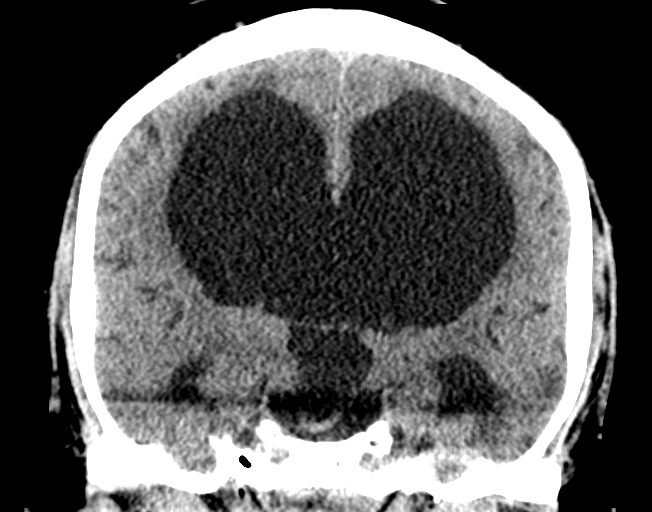
[im 42/77  brain]
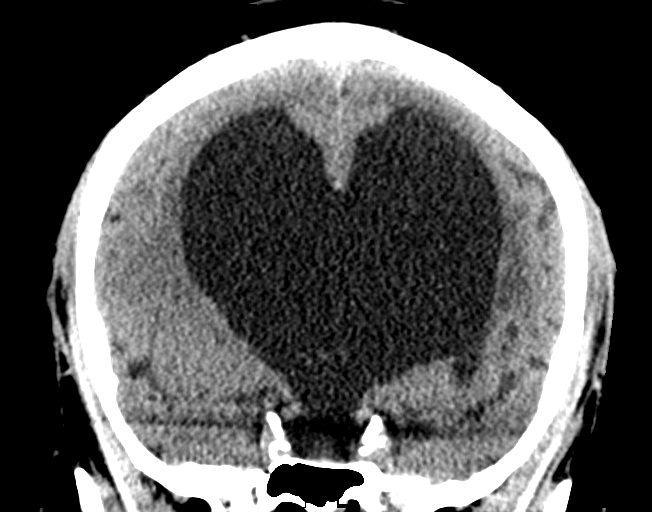

[Series 5: sagittal soft tissue · sagittal · 0.29mm/px · 3 of 64 slices shown]
[im 22/64  brain]
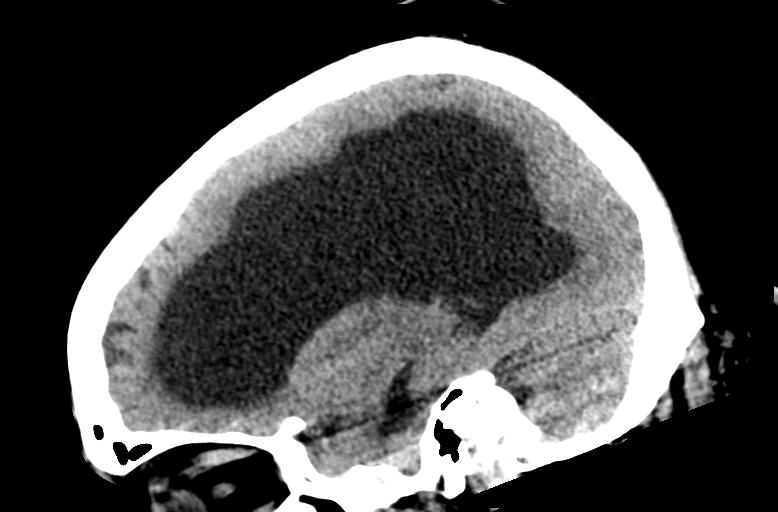
[im 32/64  brain]
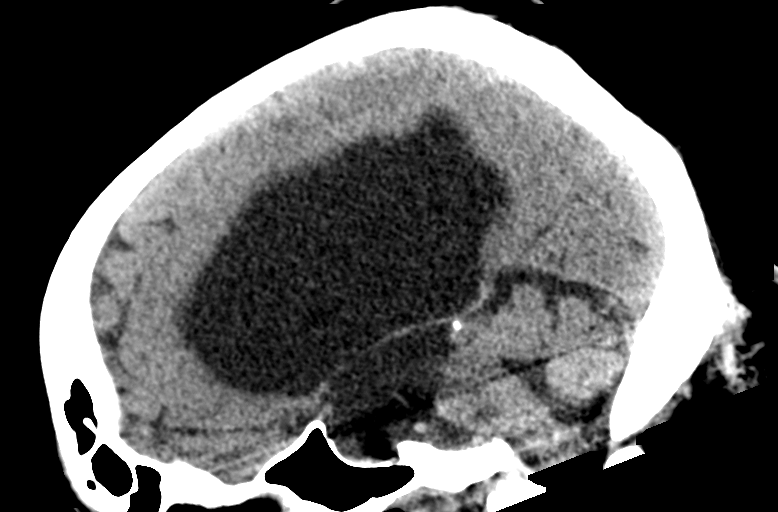
[im 43/64  brain]
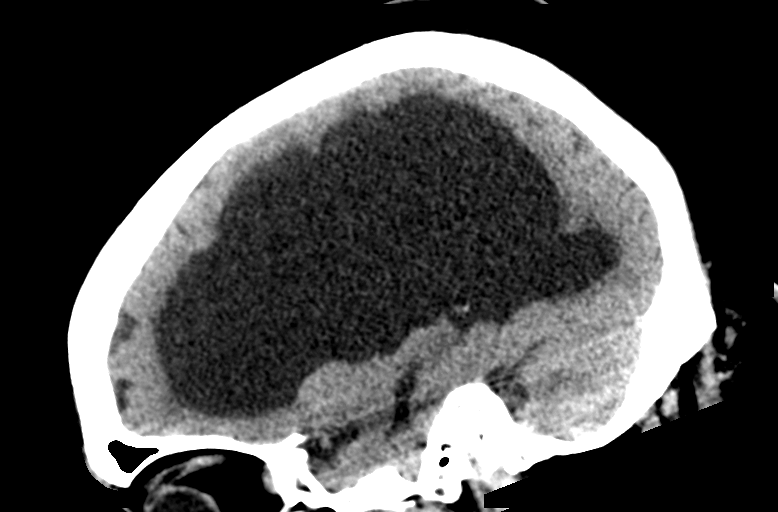

[14 of 47 positions shown; findings below may reference images not displayed]

FINDINGS: Brain: Marked enlargement of the lateral and third ventricles is a
stable finding. Fourth ventricle is normal in size and
configuration, stable. There is no appreciable intracranial mass,
hemorrhage, extra-axial fluid collection, or midline shift. There is
evidence of prior infarct in the left extreme capsule, stable. No
acute appearing infarct is evident.

Vascular: No hyperdense vessel. There is calcification in each
carotid siphon region.

Skull: Bony calvarium appears intact.

Sinuses/Orbits: There is mucosal thickening in right maxillary
antrum. Subcentimeter left maxillary antral retention cyst noted.
There is opacification and mucosal thickening in several ethmoid air
cells. Orbits appear symmetric bilaterally.

Other: Visualized mastoid air cells are clear.
IMPRESSION: Marked enlargement of the lateral and third ventricles is a stable
finding, with normal sized fourth ventricle. Appearance felt to be
indicative of aqueductal stenosis. No change in ventricle
appearance. Sulci appear normal. Prior infarct in the left extreme
capsule is stable. No acute infarct. No mass or hemorrhage.

There are foci of arterial vascular calcification. There are foci of
paranasal sinus disease.

## 2021-05-28 ENCOUNTER — Ambulatory Visit: Payer: Medicare Other | Admitting: Urology

## 2021-05-28 ENCOUNTER — Encounter: Payer: Self-pay | Admitting: Urology

## 2021-05-30 ENCOUNTER — Ambulatory Visit: Payer: Medicare Other | Admitting: Podiatry

## 2021-06-04 ENCOUNTER — Ambulatory Visit: Payer: Medicare Other | Admitting: Podiatry

## 2021-06-05 ENCOUNTER — Ambulatory Visit: Payer: Medicare Other | Admitting: Podiatry

## 2021-06-07 ENCOUNTER — Encounter: Payer: Self-pay | Admitting: Podiatry

## 2021-06-07 ENCOUNTER — Encounter (INDEPENDENT_AMBULATORY_CARE_PROVIDER_SITE_OTHER): Payer: Medicare Other | Admitting: Podiatry

## 2021-06-07 NOTE — Progress Notes (Signed)
This encounter was created in error - please disregard.

## 2021-06-20 ENCOUNTER — Telehealth: Payer: Medicare Other

## 2021-06-28 ENCOUNTER — Ambulatory Visit: Payer: Medicare Other | Admitting: Podiatry

## 2021-07-10 ENCOUNTER — Ambulatory Visit (INDEPENDENT_AMBULATORY_CARE_PROVIDER_SITE_OTHER): Payer: Medicare Other | Admitting: *Deleted

## 2021-07-10 DIAGNOSIS — Z0000 Encounter for general adult medical examination without abnormal findings: Secondary | ICD-10-CM | POA: Diagnosis not present

## 2021-07-10 NOTE — Patient Instructions (Signed)
[name] , Thank you for taking time to come for your Medicare Wellness Visit. I appreciate your ongoing commitment to your health goals. Please review the following plan we discussed and let me know if I can assist you in the future.   Screening recommendations/referrals:  Recommended yearly ophthalmology/optometry visit for glaucoma screening and checkup Recommended yearly dental visit for hygiene and checkup  Vaccinations: Influenza vaccine:  Education provided Pneumococcal vaccine: up to date Tdap vaccine: up to date     Advanced directives: Education provided  Conditions/risks identified:     Preventive Care 40-64 Years, Male Preventive care refers to lifestyle choices and visits with your health care provider that can promote health and wellness. What does preventive care include? A yearly physical exam. This is also called an annual well check. Dental exams once or twice a year. Routine eye exams. Ask your health care provider how often you should have your eyes checked. Personal lifestyle choices, including: Daily care of your teeth and gums. Regular physical activity. Eating a healthy diet. Avoiding tobacco and drug use. Limiting alcohol use. Practicing safe sex. Taking low-dose aspirin every day starting at age 68. What happens during an annual well check? The services and screenings done by your health care provider during your annual well check will depend on your age, overall health, lifestyle risk factors, and family history of disease. Counseling  Your health care provider may ask you questions about your: Alcohol use. Tobacco use. Drug use. Emotional well-being. Home and relationship well-being. Sexual activity. Eating habits. Work and work Astronomer. Screening  You may have the following tests or measurements: Height, weight, and BMI. Blood pressure. Lipid and cholesterol levels. These may be checked every 5 years, or more frequently if you are  over 45 years old. Skin check. Lung cancer screening. You may have this screening every year starting at age 100 if you have a 30-pack-year history of smoking and currently smoke or have quit within the past 15 years. Fecal occult blood test (FOBT) of the stool. You may have this test every year starting at age 27. Flexible sigmoidoscopy or colonoscopy. You may have a sigmoidoscopy every 5 years or a colonoscopy every 10 years starting at age 46. Prostate cancer screening. Recommendations will vary depending on your family history and other risks. Hepatitis C blood test. Hepatitis B blood test. Sexually transmitted disease (STD) testing. Diabetes screening. This is done by checking your blood sugar (glucose) after you have not eaten for a while (fasting). You may have this done every 1-3 years. Discuss your test results, treatment options, and if necessary, the need for more tests with your health care provider. Vaccines  Your health care provider may recommend certain vaccines, such as: Influenza vaccine. This is recommended every year. Tetanus, diphtheria, and acellular pertussis (Tdap, Td) vaccine. You may need a Td booster every 10 years. Zoster vaccine. You may need this after age 60. Pneumococcal 13-valent conjugate (PCV13) vaccine. You may need this if you have certain conditions and have not been vaccinated. Pneumococcal polysaccharide (PPSV23) vaccine. You may need one or two doses if you smoke cigarettes or if you have certain conditions. Talk to your health care provider about which screenings and vaccines you need and how often you need them. This information is not intended to replace advice given to you by your health care provider. Make sure you discuss any questions you have with your health care provider. Document Released: 07/21/2015 Document Revised: 03/13/2016 Document Reviewed: 04/25/2015 Elsevier Interactive  Patient Education  2017 [employer].  Fall Prevention in the  [facility] can cause injuries. They can happen to people of all ages. There are many things you can do to make your home safe and to help prevent falls. What can I do on the outside of my home? Regularly fix the edges of walkways and driveways and fix any cracks. Remove anything that might make you trip as you walk through a door, such as a raised step or threshold. Trim any bushes or trees on the path to your home. Use bright outdoor lighting. Clear any walking paths of anything that might make someone trip, such as rocks or tools. Regularly check to see if handrails are loose or broken. Make sure that both sides of any steps have handrails. Any raised decks and porches should have guardrails on the edges. Have any leaves, snow, or ice cleared regularly. Use sand or salt on walking paths during winter. Clean up any spills in your garage right away. This includes oil or grease spills. What can I do in the bathroom? Use night lights. Install grab bars by the toilet and in the tub and shower. Do not use towel bars as grab bars. Use non-skid mats or decals in the tub or shower. If you need to sit down in the shower, use a plastic, non-slip stool. Keep the floor dry. Clean up any water that spills on the floor as soon as it happens. Remove soap buildup in the tub or shower regularly. Attach bath mats securely with double-sided non-slip rug tape. Do not have throw rugs and other things on the floor that can make you trip. What can I do in the bedroom? Use night lights. Make sure that you have a light by your bed that is easy to reach. Do not use any sheets or blankets that are too big for your bed. They should not hang down onto the floor. Have a firm chair that has side arms. You can use this for support while you get dressed. Do not have throw rugs and other things on the floor that can make you trip. What can I do in the kitchen? Clean up any spills right away. Avoid walking on wet  floors. Keep items that you use a lot in easy-to-reach places. If you need to reach something above you, use a strong step stool that has a grab bar. Keep electrical cords out of the way. Do not use floor polish or wax that makes floors slippery. If you must use wax, use non-skid floor wax. Do not have throw rugs and other things on the floor that can make you trip. What can I do with my stairs? Do not leave any items on the stairs. Make sure that there are handrails on both sides of the stairs and use them. Fix handrails that are broken or loose. Make sure that handrails are as long as the stairways. Check any carpeting to make sure that it is firmly attached to the stairs. Fix any carpet that is loose or worn. Avoid having throw rugs at the top or bottom of the stairs. If you do have throw rugs, attach them to the floor with carpet tape. Make sure that you have a light switch at the top of the stairs and the bottom of the stairs. If you do not have them, ask someone to add them for you. What else can I do to help prevent falls? Wear shoes that: Do not have high heels.  Have rubber bottoms. Are comfortable and fit you well. Are closed at the toe. Do not wear sandals. If you use a stepladder: Make sure that it is fully opened. Do not climb a closed stepladder. Make sure that both sides of the stepladder are locked into place. Ask someone to hold it for you, if possible. Clearly mark and make sure that you can see: Any grab bars or handrails. First and last steps. Where the edge of each step is. Use tools that help you move around (mobility aids) if they are needed. These include: Canes. Walkers. Scooters. Crutches. Turn on the lights when you go into a dark area. Replace any light bulbs as soon as they burn out. Set up your furniture so you have a clear path. Avoid moving your furniture around. If any of your floors are uneven, fix them. If there are any pets around you, be aware of  where they are. Review your medicines with your doctor. Some medicines can make you feel dizzy. This can increase your chance of falling. Ask your doctor what other things that you can do to help prevent falls. This information is not intended to replace advice given to you by your health care provider. Make sure you discuss any questions you have with your health care provider. Document Released: 04/20/2009 Document Revised: 11/30/2015 Document Reviewed: 07/29/2014 Elsevier Interactive Patient Education  2017 [employer].

## 2021-07-10 NOTE — Progress Notes (Signed)
Subjective:   [name] is a 51 y.o. male who presents for Medicare Annual/Subsequent preventive examination.  I connected with  [name] on 07/10/21 by a   telephone  enabled telemedicine application and verified that I am speaking with the correct person using two identifiers.   I discussed the limitations of evaluation and management by telemedicine. The patient expressed understanding and agreed to proceed.  Patient location: home  Provider location:  Tele-Health not in office    Review of Systems     Cardiac Risk Factors include: advanced age (>19mn, >>6women);diabetes mellitus;male gender;hypertension;sedentary lifestyle;obesity (BMI >30kg/m2)     Objective:    Today's Vitals   There is no height or weight on file to calculate BMI.  Advanced Directives 07/10/2021 07/13/2020 03/20/2020 03/08/2020 08/27/2018 12/28/2015 12/18/2015  Does Patient Have a Medical Advance Directive? No No No No No No No  Would patient like information on creating a medical advance directive? No - Patient declined No - Patient declined - No - Patient declined No - Patient declined No - patient declined information No - patient declined information    Current Medications (verified) Outpatient Encounter Medications as of 07/10/2021  Medication Sig   acetaminophen (TYLENOL) 325 MG tablet Take 2 tablets (650 mg total) by mouth every 6 (six) hours as needed for mild pain.   amLODipine (NORVASC) 10 MG tablet Take 1 tablet (10 mg total) by mouth daily.   ASPIR-LOW 81 MG EC tablet TAKE 1 TABLET BY MOUTH ONCE DAILY   atorvastatin (LIPITOR) 80 MG tablet TAKE 1 TABLET BY MOUTH ONCE DAILY AT 6PM   Blood Glucose Monitoring Suppl (OVienna w/Device KIT Use to check blood sugar at least 2-3 times a day, in morning fasting and then 2 hours after a meal.   Blood Pressure Monitoring (BLOOD PRESSURE MONITOR AUTOMAT) DEVI Use to monitor blood pressure once daily.   carvedilol (COREG) 6.25 MG  tablet TAKE 1 TABLET BY MOUTH TWICE DAILY WITH A MEAL   citalopram (CELEXA) 10 MG tablet Take 1 tablet (10 mg total) by mouth daily.   clopidogrel (PLAVIX) 75 MG tablet TAKE 1 TABLET BY MOUTH ONCE DAILY WITH BREAKFAST   donepezil (ARICEPT) 5 MG tablet Take 1 tablet by mouth daily.   gabapentin (NEURONTIN) 300 MG capsule Take 1 capsule (300 mg total) by mouth 3 (three) times daily.   glucose blood (ONETOUCH VERIO) test strip Use to check blood sugar at least 2-3 times a day, in morning fasting and 2 hours after a meal.   Lancet Devices (ACCU-CHEK SOFTCLIX) lancets Use as instructed   lisinopril (ZESTRIL) 5 MG tablet Take 1 tablet (5 mg total) by mouth daily.   metFORMIN (GLUCOPHAGE-XR) 500 MG 24 hr tablet Take 1 tablet (500 mg total) by mouth daily with breakfast.   Multiple Vitamin (MULTIVITAMIN) tablet Take 1 tablet by mouth daily.   mupirocin cream (BACTROBAN) 2 % Apply 1 application topically 2 (two) times daily.   No facility-administered encounter medications on file as of 07/10/2021.    Allergies (verified) Patient has no known allergies.   History: Past Medical History:  Diagnosis Date   Depression    Diabetes mellitus without complication ([facility]    Hydrocephalus in adult ([facility]    Hyperlipidemia    Hypertension    [facility]    [facility] ([facility]    Past Surgical History:  Procedure Laterality Date   COLONOSCOPY WITH PROPOFOL N/A 03/08/2020   Procedure: COLONOSCOPY WITH PROPOFOL;  Surgeon: Virgel Manifold, MD;  Location: [facility] ENDOSCOPY;  Service: Endoscopy;  Laterality: N/A;   LOOP RECORDER IMPLANT  12/20/13   MDT LinQ implanted by Dr Lovena Le for cryptogenic stroke   LOOP RECORDER IMPLANT N/A 12/20/2013   Procedure: LOOP RECORDER IMPLANT;  Surgeon: Evans Lance, MD;  Location: [facility] CATH LAB;  Service: Cardiovascular;  Laterality: N/A;   Family History  Problem Relation Age of Onset   Stroke Father    Heart disease Father    Hypertension Father    Hypertension Mother     Diabetes Mother    Arthritis Brother    Diabetes Maternal Grandfather    Social History   Socioeconomic History   Marital status: Single    Spouse name: Not on file   Number of children: Not on file   Years of education: Not on file   Highest education level: Not on file  Occupational History   Not on file  Tobacco Use   Smoking status: Former   Smokeless tobacco: Never   Tobacco comments:    > 4 years quit  Vaping Use   Vaping Use: Never used  Substance and Sexual Activity   Alcohol use: Yes    Alcohol/week: 9.0 standard drinks    Types: 7 Cans of beer, 2 Standard drinks or equivalent per week    Comment: in a week   Drug use: No   Sexual activity: Not Currently  Other Topics Concern   Not on file  Social History Narrative   Not on file   Social Determinants of Health   Financial Resource Strain: Low Risk    Difficulty of Paying Living Expenses: Not hard at all  Food Insecurity: No Food Insecurity   Worried About Charity fundraiser in the Last Year: Never true   Ran Out of Food in the Last Year: Never true  Transportation Needs: No Transportation Needs   Lack of Transportation (Medical): No   Lack of Transportation (Non-Medical): No  Physical Activity: Inactive   Days of Exercise per Week: 0 days   Minutes of Exercise per Session: 0 min  Stress: No Stress Concern Present   Feeling of Stress : Not at all  Social Connections: Socially Isolated   Frequency of Communication with Friends and Family: Three times a week   Frequency of Social Gatherings with Friends and Family: Three times a week   Attends Religious Services: Never   Active Member of Clubs or Organizations: No   Attends Archivist Meetings: Never   Marital Status: Never married    Tobacco Counseling Counseling given: Not Answered Tobacco comments: > 4 years quit   Clinical Intake:  Pre-visit preparation completed: Yes  Pain : No/denies pain     Nutritional Risks:  None Diabetes: Yes CBG done?: No Did pt. bring in CBG monitor from home?: No  How often do you need to have someone help you when you read instructions, pamphlets, or other written materials from your doctor or pharmacy?: 1 - Never  Diabetic?  Yes   Nutrition Risk Assessment:  Has the patient had any N/V/D within the last 2 months?  No  Does the patient have any non-healing wounds?  No  Has the patient had any unintentional weight loss or weight gain?  No   Diabetes:  Is the patient diabetic?  Yes  If diabetic, was a CBG obtained today?  No  Did the patient bring in their glucometer from home?  No  How  often do you monitor your CBG's? 1 x daily.   Financial Strains and Diabetes Management:  Are you having any financial strains with the device, your supplies or your medication? No .  Does the patient want to be seen by Chronic Care Management for management of their diabetes?  No  Would the patient like to be referred to a Nutritionist or for Diabetic Management?  No   Diabetic Exams:  Diabetic Eye Exam: Completed . Overdue for diabetic eye exam. Pt has been advised about the importance in completing this exam.  Diabetic Foot Exam: . Pt has been advised about the importance in completing this exam.   Interpreter Needed?: No  Information entered by :: Leroy Kennedy LPN   Activities of Daily Living In your present state of health, do you have any difficulty performing the following activities: 07/10/2021 04/03/2021  Hearing? N N  Vision? N Y  Difficulty concentrating or making decisions? N N  Walking or climbing stairs? Y Y  Dressing or bathing? Y Y  Doing errands, shopping? Tempie Donning  Preparing Food and eating ? Y N  Using the Toilet? Y Y  In the past six months, have you accidently leaked urine? N Y  Do you have problems with loss of bowel control? N Y  Managing your Medications? Y Y  Managing your Finances? Tempie Donning  Housekeeping or managing your Housekeeping? Y N  Some recent  data might be hidden    Patient Care Team: Venita Lick, NP as PCP - General (Nurse Practitioner) Kate Sable, MD as PCP - Cardiology (Cardiology) Vanita Ingles, RN as Case Manager (General Practice) Rebekah Chesterfield, LCSW as Social Worker (Licensed Clinical Social Worker)  Indicate any recent [place] you may have received from other than [facility] providers in the past year (date may be approximate).     Assessment:   This is a routine wellness examination for [place].  Hearing/Vision screen Hearing Screening - Comments:: No trouble hearing Vision Screening - Comments:: Up to date [facility]  Dietary issues and exercise activities discussed: Current Exercise Habits: The patient does not participate in regular exercise at present, Exercise limited by: Other - see comments   Goals Addressed             This Visit's Progress    Patient Stated       No goals       Depression Screen PHQ 2/9 Scores 07/10/2021 04/24/2021 04/24/2021 04/03/2021 02/16/2021 12/14/2020 07/18/2020  PHQ - 2 Score 0 0 0 0 0 0 -  PHQ- 9 Score - 1 - - 0 - -  Exception Documentation - - - - - - Other- indicate reason in comment box    Fall Risk Fall Risk  07/10/2021 04/24/2021 04/03/2021 12/14/2020 07/18/2020  Falls in the past year? 0 1 0 0 1  Number falls in past yr: 0 0 0 0 1  Injury with Fall? 0 0 0 0 0  Risk for fall due to : - Impaired balance/gait;Impaired mobility;History of fall(s) No Fall Risks Impaired balance/gait;Impaired mobility Impaired balance/gait  Follow up Falls evaluation completed;Education provided;Falls prevention discussed Falls evaluation completed Falls evaluation completed Falls evaluation completed -    FALL RISK PREVENTION PERTAINING TO THE HOME:  Any stairs in or around the home? No  If so, are there any without handrails? No  Home free of loose throw rugs in walkways, pet beds, electrical cords, etc? Yes  Adequate lighting in your home to reduce  risk of falls?  Yes   ASSISTIVE DEVICES UTILIZED TO PREVENT FALLS:  Life alert? No  Use of a cane, walker or w/c? Yes  Grab bars in the bathroom? Yes  Shower chair or bench in shower? Yes  Elevated toilet seat or a handicapped toilet? No   TIMED UP AND GO:  Was the test performed? No .    Cognitive Function:  Normal cognitive status assessed by direct observation by this Nurse Health Advisor. No abnormalities found.       6CIT Screen 04/03/2021  What Year? 0 points  What month? 0 points  What time? 0 points  Count back from 20 2 points  Months in reverse 4 points  Repeat phrase 0 points  Total Score 6    Immunizations Immunization History  Administered Date(s) Administered   Influenza,inj,Quad PF,6+ Mos 09/19/2015, 08/12/2018   PFIZER(Purple Top)SARS-COV-2 Vaccination 11/19/2019, 12/24/2019   Pneumococcal Polysaccharide-23 10/20/2006   Tdap 10/16/2015    TDAP status: Up to date  Flu Vaccine status: Due, Education has been provided regarding the importance of this vaccine. Advised may receive this vaccine at local pharmacy or Health Dept. Aware to provide a copy of the vaccination record if obtained from local pharmacy or Health Dept. Verbalized acceptance and understanding.  Pneumococcal vaccine status: Up to date  Covid-19 vaccine status: Information provided on how to obtain vaccines.   Qualifies for Shingles Vaccine? No   Zostavax completed No   Shingrix Completed?: No.    Education has been provided regarding the importance of this vaccine. Patient has been advised to call insurance company to determine out of pocket expense if they have not yet received this vaccine. Advised may also receive vaccine at local pharmacy or Health Dept. Verbalized acceptance and understanding.  Screening Tests Health Maintenance  Topic Date Due   Pneumococcal Vaccine 36-1 Years old (2 - PCV) 10/20/2007   COVID-19 Vaccine (3 - Booster for Pfizer series) 02/18/2020   INFLUENZA VACCINE   02/05/2021   Hepatitis C Screening  07/18/2021 (Originally 08/28/1988)   FOOT EXAM  07/18/2021   HEMOGLOBIN A1C  08/19/2021   OPHTHALMOLOGY EXAM  02/26/2022   TETANUS/TDAP  10/15/2025   COLONOSCOPY (Pts 45-78yr Insurance coverage will need to be confirmed)  03/08/2030   HIV Screening  Completed   HPV VACCINES  Aged Out    Health Maintenance  Health Maintenance Due  Topic Date Due   Pneumococcal Vaccine 11618Years old (2 - PCV) 10/20/2007   COVID-19 Vaccine (3 - Booster for Pfizer series) 02/18/2020   INFLUENZA VACCINE  02/05/2021      Lung Cancer Screening: (Low Dose CT Chest recommended if Age 49-80years, 30 pack-year currently smoking OR have quit w/in 15years.) does not qualify.   Lung Cancer Screening Referral:   Additional Screening:  Hepatitis C Screening: does not qualify;   Vision Screening: Recommended annual ophthalmology exams for early detection of glaucoma and other disorders of the eye. Is the patient up to date with their annual eye exam?  Yes  Who is the provider or what is the name of the [facility] which the patient [facility]ends annual [facility] exams[facility] provider, would they like to be referred to a provider to establish care? No .   Dental Screening: Recommended annual dental exams for proper oral hygiene  Community Resource Referral / Chronic Care Management: CRR required this visit?  No   CCM required this visit?  No  Plan:     I have personally reviewed and noted the following in the patients chart:   Medical and social history Use of alcohol, tobacco or illicit drugs  Current medications and supplements including opioid prescriptions. Patient is not currently taking opioid prescriptions. Functional ability and status Nutritional status Physical activity Advanced directives List of other physicians Hospitalizations, surgeries, and ER visits in previous 12 months Vitals Screenings to include  cognitive, depression, and falls Referrals and appointments  In addition, I have reviewed and discussed with patient certain preventive protocols, quality metrics, and best practice recommendations. A written personalized care plan for preventive services as well as general preventive health recommendations were provided to patient.     Leroy Kennedy, LPN   03/12/8440   Nurse Notes

## 2021-07-19 ENCOUNTER — Encounter: Payer: Self-pay | Admitting: Podiatry

## 2021-07-19 ENCOUNTER — Ambulatory Visit (INDEPENDENT_AMBULATORY_CARE_PROVIDER_SITE_OTHER): Payer: Medicare Other | Admitting: Podiatry

## 2021-07-19 ENCOUNTER — Other Ambulatory Visit: Payer: Self-pay

## 2021-07-19 DIAGNOSIS — E0843 Diabetes mellitus due to underlying condition with diabetic autonomic (poly)neuropathy: Secondary | ICD-10-CM | POA: Diagnosis not present

## 2021-07-19 DIAGNOSIS — B351 Tinea unguium: Secondary | ICD-10-CM | POA: Diagnosis not present

## 2021-07-19 DIAGNOSIS — M79676 Pain in unspecified toe(s): Secondary | ICD-10-CM

## 2021-07-19 DIAGNOSIS — Z8673 Personal history of transient ischemic attack (TIA), and cerebral infarction without residual deficits: Secondary | ICD-10-CM

## 2021-07-19 NOTE — Progress Notes (Signed)
This patient returns to my office for at risk foot care.  This patient requires this care by a professional since this patient will be at risk due to having type 2 diabetes with neuropathy and coagulation defect due to plavix.  This patient is unable to cut nails himself since the patient cannot reach his nails.These nails are painful walking and wearing shoes.  This patient presents for at risk foot care today.  General Appearance  Alert, conversant and in no acute stress.  Vascular  Dorsalis pedis and posterior tibial  pulses are palpable  bilaterally.  Capillary return is within normal limits  bilaterally. Temperature is within normal limits  bilaterally.  Neurologic  Senn-Weinstein monofilament wire test within normal limits  bilaterally. Muscle power within normal limits bilaterally.  Nails Thick disfigured discolored nails with subungual debris  from hallux to fifth toes bilaterally. No evidence of bacterial infection or drainage bilaterally.  Orthopedic  No limitations of motion  feet .  No crepitus or effusions noted.  No bony pathology or digital deformities noted.  Skin  normotropic skin with no porokeratosis noted bilaterally.  No signs of infections or ulcers noted.     Onychomycosis  Pain in right toes  Pain in left toes  Consent was obtained for treatment procedures.   Mechanical debridement of nails 1-5  bilaterally performed with a nail nipper.  Filed with dremel without incident.  His left hallux toenail is unattached from his nail bed.  Patient is having no redness or drainage or pain.  Discussed cutting this nail leaving 1/2 nail intact despite its unattachment.  If the nail becomes symptomatic he needs to be evaluated.  His brother came back to the treatment room asking for diabetic shoes.  He will be evaluated for shoes next visit.   Return office visit   3 months                   Told patient to return for periodic foot care and evaluation due to potential at risk  complications.   Helane Gunther DPM

## 2021-07-23 ENCOUNTER — Ambulatory Visit: Payer: Medicare Other | Admitting: Urology

## 2021-08-03 ENCOUNTER — Encounter: Payer: Self-pay | Admitting: Nurse Practitioner

## 2021-08-03 ENCOUNTER — Other Ambulatory Visit: Payer: Self-pay | Admitting: Nurse Practitioner

## 2021-08-03 NOTE — Telephone Encounter (Signed)
Requested Prescriptions  Pending Prescriptions Disp Refills   lisinopril (ZESTRIL) 5 MG tablet [Pharmacy Med Name: LISINOPRIL 5 MG TAB] 90 tablet 4    Sig: TAKE 1 TABLET BY MOUTH ONCE DAILY     Cardiovascular:  ACE Inhibitors Passed - 08/03/2021 10:14 AM      Passed - Cr in normal range and within 180 days    Creatinine  Date Value Ref Range Status  07/07/2014 1.19 0.60 - 1.30 mg/dL Final   Creatinine, Ser  Date Value Ref Range Status  02/16/2021 1.06 0.76 - 1.27 mg/dL Final         Passed - K in normal range and within 180 days    Potassium  Date Value Ref Range Status  02/16/2021 4.9 3.5 - 5.2 mmol/L Final  07/07/2014 3.7 3.5 - 5.1 mmol/L Final         Passed - Patient is not pregnant      Passed - Last BP in normal range    BP Readings from Last 1 Encounters:  04/24/21 119/75         Passed - Valid encounter within last 6 months    Recent Outpatient Visits          3 months ago Great toe pain, left   [facility] McElwee, Lauren A, NP   5 months ago Type 2 diabetes mellitus with diabetic polyneuropathy, without long-term current use of insulin ([place])   [place], [name] T, NP   7 months ago Type 2 diabetes mellitus with proteinuria ([place])   [place], [name] T, NP   8 months ago Type 2 diabetes mellitus with proteinuria ([place])   [place], Henrine Screws T, NP   11 months ago Poor mobility   [place], Arispe T, NP      Future Appointments            In 3 days MacDiarmid, Nicki Reaper, MD [place]            citalopram (CELEXA) 10 MG tablet [Pharmacy Med Name: CITALOPRAM HYDROBROMIDE 10 MG TAB] 90 tablet 4    Sig: TAKE 1 TABLET BY MOUTH ONCE DAILY     Psychiatry:  Antidepressants - SSRI Passed - 08/03/2021 10:14 AM      Passed - Valid encounter within last 6 months    Recent Outpatient Visits          3 months ago Great toe pain, left    [place], Lauren A, NP   5 months ago Type 2 diabetes mellitus with diabetic polyneuropathy, without long-term current use of insulin ([place])   [place], [name] T, NP   7 months ago Type 2 diabetes mellitus with proteinuria ([place])   [place], [name] T, NP   8 months ago Type 2 diabetes mellitus with proteinuria ([place])   [place], [place]ami[place] T, NP   11 months ago Poor mobility   [place], [place] T, NP      Future Appointments            In 3 days MacDiarmid, Nicki Reaper, MD [facility]            carvedilol (COREG) 6.25 MG tablet [Pharmacy Med Name: CARVEDILOL 6.25 MG TAB] 180 tablet 4    Sig: TAKE 1 TABLET BY MOUTH TWICE DAILY WITH A MEAL     Cardiovascular:  Beta Blockers Passed -  08/03/2021 10:14 AM      Passed - Last BP in normal range    BP Readings from Last 1 Encounters:  04/24/21 119/75         Passed - Last Heart Rate in normal range    Pulse Readings from Last 1 Encounters:  04/24/21 64         Passed - Valid encounter within last 6 months    Recent Outpatient Visits          3 months ago Great toe pain, left   [facility] McElwee, Lauren A, NP   5 months ago Type 2 diabetes mellitus with diabetic polyneuropathy, without long-term current use of insulin ([place])   [place], [name] T, NP   7 months ago Type 2 diabetes mellitus with proteinuria ([place])   [place], [name] T, NP   8 months ago Type 2 diabetes mellitus with proteinuria ([place])   [place], [place] T, NP   11 months ago Poor mobility   [employer], [place] T, NP      Future Appointments            In 3 days MacDiarmid, Nicki Reaper, MD [facility]            atorvastatin (LIPITOR) 80 MG tablet [Pharmacy Med Name: ATORVASTATIN CALCIUM 80 MG TAB] 90 tablet 4     Sig: TAKE 1 TABLET BY MOUTH ONCE DAILY AT 6PM     Cardiovascular:  Antilipid - Statins Passed - 08/03/2021 10:14 AM      Passed - Total Cholesterol in normal range and within 360 days    Cholesterol, Total  Date Value Ref Range Status  02/16/2021 113 100 - 199 mg/dL Final   Cholesterol  Date Value Ref Range Status  12/15/2013 205 (H) 0 - 200 mg/dL Final   Cholesterol Piccolo, Waived  Date Value Ref Range Status  12/02/2018 123 <200 mg/dL Final    Comment:                            Desirable                <200                         Borderline High      200- 239                         High                     >239          Passed - LDL in normal range and within 360 days    Ldl Cholesterol, Calc  Date Value Ref Range Status  12/15/2013 125 (H) 0 - 100 mg/dL Final   LDL Chol Calc (NIH)  Date Value Ref Range Status  02/16/2021 39 0 - 99 mg/dL Final         Passed - HDL in normal range and within 360 days    HDL Cholesterol  Date Value Ref Range Status  12/15/2013 49 40 - 60 mg/dL Final   HDL  Date Value Ref Range Status  02/16/2021 61 >39 mg/dL Final         Passed - Triglycerides in normal range and within 360 days  Triglycerides  Date Value Ref Range Status  02/16/2021 56 0 - 149 mg/dL Final  12/15/2013 154 0 - 200 mg/dL Final   Triglycerides Piccolo,Waived  Date Value Ref Range Status  12/02/2018 73 <150 mg/dL Final    Comment:                            Normal                   <150                         Borderline High     150 - 199                         High                200 - 499                         Very High                >499          Passed - Patient is not pregnant      Passed - Valid encounter within last 12 months    Recent Outpatient Visits          3 months ago Great toe pain, left   [facility] McElwee, Lauren A, NP   5 months ago Type 2 diabetes mellitus with diabetic polyneuropathy, without long-term  current use of insulin ([place])   [place], [name] T, NP   7 months ago Type 2 diabetes mellitus with proteinuria ([place])   [place], [name] T, NP   8 months ago Type 2 diabetes mellitus with proteinuria ([place])   [place], Henrine Screws T, NP   11 months ago Poor mobility   [employer], [place] T, NP      Future Appointments            In 3 days MacDiarmid, Nicki Reaper, MD [facility]            amLODipine (NORVASC) 10 MG tablet [Pharmacy Med Name: AMLODIPINE BESYLATE 10 MG TAB] 90 tablet 4    Sig: TAKE 1 TABLET BY MOUTH ONCE DAILY     Cardiovascular:  Calcium Channel Blockers Passed - 08/03/2021 10:14 AM      Passed - Last BP in normal range    BP Readings from Last 1 Encounters:  04/24/21 119/75         Passed - Valid encounter within last 6 months    Recent Outpatient Visits          3 months ago Great toe pain, left   [facility] McElwee, Lauren A, NP   5 months ago Type 2 diabetes mellitus with diabetic polyneuropathy, without long-term current use of insulin ([place])   [place], [name] T, NP   7 months ago Type 2 diabetes mellitus with proteinuria ([place])   [place], [name] T, NP   8 months ago Type 2 diabetes mellitus with proteinuria ([place])   [place], [place] T, NP   11 months ago Poor mobility   [facility] [place], Henrine Screws T, NP      Future Appointments  In 3 days MacDiarmid, Nicki Reaper, [place]

## 2021-08-06 ENCOUNTER — Other Ambulatory Visit: Payer: Self-pay

## 2021-08-06 ENCOUNTER — Ambulatory Visit (INDEPENDENT_AMBULATORY_CARE_PROVIDER_SITE_OTHER): Payer: Medicare Other | Admitting: Urology

## 2021-08-06 VITALS — BP 131/83 | HR 74 | Ht 70.0 in | Wt 275.0 lb

## 2021-08-06 DIAGNOSIS — N3946 Mixed incontinence: Secondary | ICD-10-CM | POA: Diagnosis not present

## 2021-08-06 NOTE — Progress Notes (Signed)
° °08/06/2021 °1:05 PM  ° °[name] °08/25/1970 °[id number] ° °Referring provider: Cannady, [name] T, NP °[address] °[place],  [place] [address] ° °No chief complaint on file. ° ° °HPI: °I was consulted to assist the patient's urgency incontinence over the last 2 years.  He has had a stroke and utilizes a cane and walks slowly.  No stress incontinence or bedwetting.  He wears 2 pads a day that can be damp or soaked. °  °Patient said his flow was reasonable and he feels empty °  °Reassess in 6 weeks with a residual on Flomax.  Patient aware there is many ways of helping him.  Mild elevated residual of 115 may be false positive. °  °Urgency much better.  Urge incontinence improved.  Very pleased.   °  °Patient came in today 1 week later now with urge incontinence.  Urine might be strong smelling.  He could not leave a sample but his family or friend will bring back one today and will send it for culture.  I will add Myrbetriq.  They were asking about a condom catheter eventually and it may be needed.   °  °Reassess in 6 weeks on Myrbetriq 50 mg samples and prescription and Flomax.  Patient triggering is going from sitting to standing position.  At patient request I gave a box of condom catheters with the prescription.  Hoping to get him nearly dry as well °  °Last culture positive °Urgency incontinence worse.  They tried condom catheters and they fall off.  It is difficult to say but the patient based on chart review and today may or may not have gotten the antibiotic.  Difficult to say if he is clinically infected today   °  °Based on last culture I called and ciprofloxacin 250 mg twice a day for 1 week.  He will then go on daily suppression therapy trimethoprim 100 mg 3x11.  Stay on Myrbetriq and Flomax.  Reassess in 8 weeks.  Percutaneous tibial nerve stimulation is an option.  Timed voiding and reasonable goals is important °  °Trospium was added to the Flomax and Myrbetriq by nurse practitioner.  Was considering  a Foley catheter. °  °The patient can use a cane.  I had him stop the trospium and Myrbetriq since is not working.  I do not think he still on Flomax and I will check next time.   ° °Reassess on the new beta 3 agonist in 5 weeks samples given.  Botox is a distant option but high risk of retention. °  °Percutaneous tibial nerve stimulation could benefit and will be recommended ° °Today °Gemtesa helped urge incontinence some.  Patient needs help to transfer from wheelchair.  He still on Flomax.  I went over percutaneous tibial nerve stimulation.  Handout given.  Clinically not infected ° ° °PMH: °Past Medical History:  °Diagnosis Date  ° Depression   ° Diabetes mellitus without complication (HCC)   ° Hydrocephalus in adult (HCC)   ° Hyperlipidemia   ° Hypertension   ° Seizures (HCC)   ° Stroke (HCC)   ° ° °Surgical History: °Past Surgical History:  °Procedure Laterality Date  ° COLONOSCOPY WITH PROPOFOL N/A 03/08/2020  ° Procedure: COLONOSCOPY WITH PROPOFOL;  Surgeon: Tahiliani, Varnita B, MD;  Location: [facility] ENDOSCOPY;  Service: Endoscopy;  Laterality: N/A;  ° LOOP RECORDER IMPLANT  12/20/13  ° MDT LinQ implanted by Dr Taylor for cryptogenic stroke  ° LOOP RECORDER IMPLANT N/A 12/20/2013  °   12/20/2013   Procedure: LOOP RECORDER IMPLANT;  Surgeon: Evans Lance, MD;  Location: [facility] CATH LAB;  Service: Cardiovascular;  Laterality: N/A;    Home Medications:  Allergies as of 08/06/2021   No Known Allergies      Medication List        Accurate as of August 06, 2021  1:05 PM. If you have any questions, ask your nurse or doctor.          accu-chek softclix lancets Use as instructed   acetaminophen 325 MG tablet Commonly known as: Tylenol Take 2 tablets (650 mg total) by mouth every 6 (six) hours as needed for mild pain.   amLODipine 10 MG tablet Commonly known as: NORVASC TAKE 1 TABLET BY MOUTH ONCE DAILY   Aspir-Low 81 MG EC tablet Generic drug: aspirin TAKE 1 TABLET BY MOUTH ONCE DAILY   atorvastatin 80 MG  tablet Commonly known as: LIPITOR TAKE 1 TABLET BY MOUTH ONCE DAILY AT 6PM   Blood Pressure Monitor Automat Devi Use to monitor blood pressure once daily.   carvedilol 6.25 MG tablet Commonly known as: COREG TAKE 1 TABLET BY MOUTH TWICE DAILY WITH A MEAL   citalopram 10 MG tablet Commonly known as: CELEXA TAKE 1 TABLET BY MOUTH ONCE DAILY   clopidogrel 75 MG tablet Commonly known as: PLAVIX TAKE 1 TABLET BY MOUTH ONCE DAILY WITH BREAKFAST   donepezil 5 MG tablet Commonly known as: ARICEPT Take 1 tablet by mouth daily.   gabapentin 300 MG capsule Commonly known as: NEURONTIN Take 1 capsule (300 mg total) by mouth 3 (three) times daily.   lisinopril 5 MG tablet Commonly known as: ZESTRIL TAKE 1 TABLET BY MOUTH ONCE DAILY   metFORMIN 500 MG 24 hr tablet Commonly known as: GLUCOPHAGE-XR Take 1 tablet (500 mg total) by mouth daily with breakfast.   multivitamin tablet Take 1 tablet by mouth daily.   mupirocin cream 2 % Commonly known as: Bactroban Apply 1 application topically 2 (two) times daily.   Myrbetriq 50 MG Tb24 tablet Generic drug: mirabegron ER Take 50 mg by mouth daily.   OneTouch Verio Flex System w/Device Kit Use to check blood sugar at least 2-3 times a day, in morning fasting and then 2 hours after a meal.   OneTouch Verio test strip Generic drug: glucose blood Use to check blood sugar at least 2-3 times a day, in morning fasting and 2 hours after a meal.   tamsulosin 0.4 MG Caps capsule Commonly known as: FLOMAX Take 0.4 mg by mouth daily.        Allergies: No Known Allergies  Family History: Family History  Problem Relation Age of Onset   Stroke Father    Heart disease Father    Hypertension Father    Hypertension Mother    Diabetes Mother    Arthritis Brother    Diabetes Maternal Grandfather     Social History:  reports that he has quit smoking. He has never used smokeless tobacco. He reports current alcohol use of about 9.0  standard drinks per week. He reports that he does not use drugs.  ROS:                                        Physical Exam: There were no vitals taken for this visit.    Laboratory Data: Lab Results  Component Value Date   WBC  4.2 07/13/2020   HGB 13.6 07/13/2020   HCT 41.4 07/13/2020   MCV 94.1 07/13/2020   PLT 205 07/13/2020    Lab Results  Component Value Date   CREATININE 1.06 02/16/2021    No results found for: PSA  No results found for: TESTOSTERONE  Lab Results  Component Value Date   HGBA1C 5.5 02/16/2021    Urinalysis    Component Value Date/Time   COLORURINE YELLOW (A) 07/13/2020 1313   APPEARANCEUR Hazy (A) 03/05/2021 1156   LABSPEC 1.018 07/13/2020 1313   LABSPEC 1.010 12/14/2013 0959   PHURINE 7.0 07/13/2020 1313   GLUCOSEU Negative 03/05/2021 1156   GLUCOSEU Negative 12/14/2013 0959   HGBUR NEGATIVE 07/13/2020 1313   BILIRUBINUR Negative 03/05/2021 1156   BILIRUBINUR Negative 12/14/2013 0959   KETONESUR NEGATIVE 07/13/2020 1313   PROTEINUR 1+ (A) 03/05/2021 1156   PROTEINUR 100 (A) 07/13/2020 1313   UROBILINOGEN 1.0 12/22/2013 1648   NITRITE Negative 03/05/2021 1156   NITRITE NEGATIVE 07/13/2020 1313   LEUKOCYTESUR Negative 03/05/2021 1156   LEUKOCYTESUR NEGATIVE 07/13/2020 1313   LEUKOCYTESUR Negative 12/14/2013 0959    Pertinent Imaging:   Assessment & Plan: Unfortunately were reaching the end of the treatment algorithm.  I did not order urodynamics.  He would be at high risk for retention with Botox.  Long-term Foley catheter or suprapubic tube not that ideal.  Gemtesa samples and prescription given.  Handout given in order percutaneous tibial nerve stimulation.  There are no diagnoses linked to this encounter.  No follow-ups on file.  Reece Packer, MD  [facility] [address] [place], [place] [address] [phone]

## 2021-08-13 ENCOUNTER — Ambulatory Visit: Payer: Medicare Other

## 2021-08-13 ENCOUNTER — Other Ambulatory Visit: Payer: Self-pay

## 2021-08-15 ENCOUNTER — Telehealth: Payer: Medicare Other

## 2021-08-15 ENCOUNTER — Ambulatory Visit (INDEPENDENT_AMBULATORY_CARE_PROVIDER_SITE_OTHER): Payer: Medicare Other

## 2021-08-15 DIAGNOSIS — N3941 Urge incontinence: Secondary | ICD-10-CM

## 2021-08-15 DIAGNOSIS — E1142 Type 2 diabetes mellitus with diabetic polyneuropathy: Secondary | ICD-10-CM

## 2021-08-15 DIAGNOSIS — E1169 Type 2 diabetes mellitus with other specified complication: Secondary | ICD-10-CM

## 2021-08-15 DIAGNOSIS — G918 Other hydrocephalus: Secondary | ICD-10-CM

## 2021-08-15 DIAGNOSIS — I10 Essential (primary) hypertension: Secondary | ICD-10-CM

## 2021-08-15 DIAGNOSIS — I152 Hypertension secondary to endocrine disorders: Secondary | ICD-10-CM

## 2021-08-15 DIAGNOSIS — Z8673 Personal history of transient ischemic attack (TIA), and cerebral infarction without residual deficits: Secondary | ICD-10-CM

## 2021-08-15 NOTE — Patient Instructions (Signed)
Visit Information  Thank you for taking time to visit with me today. Please don't hesitate to contact me if I can be of assistance to you before our next scheduled telephone appointment.  Following are the goals we discussed today:  RNCM Clinical Goal(s):  Patient will verbalize understanding of plan for management of HTN, HLD, DMII, and Urinary Incontinence and history of brain hydrocephalus syndrome as evidenced by keeping appointments, stable conditions, calling the office for questions or concerns and working with the CCM team to optimize health and well being  take all medications exactly as prescribed and will call provider for medication related questions as evidenced by compliance with medications and calling for refills before running out of medications    attend all scheduled medical appointments: keep appointments with pcp  as evidenced by compliance with routine office visits and calling  the office for schedule change needs         demonstrate improved and ongoing adherence to prescribed treatment plan for HTN, HLD, DMII, and urinary incontinence, and  history of stroke with brain hydrocephalus  as evidenced by VS stable, lab work stable, no exacerbations of chronic conditions, and patient able to maintain health and well being continue to work with Consulting civil engineer and/or Social Worker to address care management and care coordination needs related to HTN, HLD, DMII, and urinary incontinence and history of stroke with brain syndrome as evidenced by adherence to CM Team Scheduled appointments     demonstrate ongoing self health care management ability effective management of chronic conditions as evidenced by working with the CCM team  through collaboration with Consulting civil engineer, provider, and care team.    Interventions: 1:1 collaboration with primary care provider regarding development and update of comprehensive plan of care as evidenced by provider attestation and  co-signature Inter-disciplinary care team collaboration (see longitudinal plan of care) Evaluation of current treatment plan related to  self management and patient's adherence to plan as established by provider     Diabetes:  (Status: Goal on Track (progressing): YES.) Long Term Goal         Lab Results  Component Value Date    HGBA1C 5.5 02/16/2021    Assessed patient's understanding of A1c goal: <7% Provided education to patient about basic DM disease process; Reviewed medications with patient and discussed importance of medication adherence. 08-15-2021; States compliance with medications         Reviewed prescribed diet with patient heart healthy/ADA diet. 08-15-2021: The brother denies any issues with appetite or following of dietary restrictions ; Counseled on importance of regular laboratory monitoring as prescribed. 08-15-2021: Has regular lab testing;        Discussed plans with patient for ongoing care management follow up and provided patient with direct contact information for care management team;      Provided patient with written educational materials related to hypo and hyperglycemia and importance of correct treatment. 08-15-2021: Review of hypo and hyperglycemia. The patients brother assist with his care and denies any issues with his DM management ;       Advised patient, providing education and rationale, to check cbg twice daily, when you have symptoms of low or high blood sugar, and before and after exercise and record. 08-15-2021: Per the patients brother his blood sugars are ranging from 80-150's. Denies any issues with DM management.     call provider for findings outside established parameters;       Review of patient status, including review of  consultants reports, relevant laboratory and other test results, and medications completed;       Screening for signs and symptoms of depression related to chronic disease state;        Assessed social determinant of health barriers;   Last vision test in December 2022 per the patients brother         Urinary Incontinence   (Status: Goal on Track (progressing): YES.) Long Term Goal  Evaluation of current treatment plan related to  Urinary incontinence , Limited social support, Level of care concerns, Mental Health Concerns , and Memory Deficits self-management and patient's adherence to plan as established by provider. Discussed plans with patient for ongoing care management follow up and provided patient with direct contact information for care management team Advised patient to call the specialist for changes in urinary health and well being, changes in urinary habits or worsening sx and sx of incontinence ; Provided education to patient re: routine follow up visits with the provider, monitoring for sx and sx of infection, especially UTI's. Calling the office for changes in urinary health and well being; Reviewed medications with patient and discussed compliance. The patient is taking Myrbetriq 50 mg and the patients brother states since he is taking this he is doing a lot better with not having "accidents". States the medication is working well; Provided patient with urinary health and wellness  educational materials related to urinary incontinence ; Discussed plans with patient for ongoing care management follow up and provided patient with direct contact information for care management team; Advised patient to discuss changes in urinary habits, questions, or concerns with provider; Review of housing situation. The patient is trying to get housing through [facility]. Peer support person [name] is assisting the patient and his brother with this request. [name] states that he has not received any current updates about new housing at this time.    Hyperlipidemia:  (Status: Goal on Track (progressing): YES.) Long Term Goal       Lab Results  Component Value Date    CHOL 113 02/16/2021    HDL 61 02/16/2021     LDLCALC 39 02/16/2021    TRIG 56 02/16/2021    CHOLHDL 2.5 10/11/2015      Medication review performed; medication list updated in electronic medical record.  Provider established cholesterol goals reviewed; Counseled on importance of regular laboratory monitoring as prescribed; Provided HLD educational materials; Reviewed role and benefits of statin for ASCVD risk reduction; Discussed strategies to manage statin-induced myalgias; Reviewed importance of limiting foods high in cholesterol;   Hypertension: (Status: Goal on Track (progressing): YES.) Long Term Goal  Last practice recorded BP readings:     BP Readings from Last 3 Encounters:  08/06/21 131/83  04/24/21 119/75  04/23/21 122/80  Most recent eGFR/CrCl:       Lab Results  Component Value Date    EGFR 87 02/16/2021    No components found for: CRCL   Evaluation of current treatment plan related to hypertension self management and patient's adherence to plan as established by provider;   Provided education to patient re: stroke prevention, s/s of heart attack and stroke; Reviewed prescribed diet heart healthy/ADA diet  Reviewed medications with patient and discussed importance of compliance;  Discussed plans with patient for ongoing care management follow up and provided patient with direct contact information for care management team; Advised patient, providing education and rationale, to monitor blood pressure daily and record, calling PCP for findings outside established  parameters;  Advised patient to discuss changes in blood pressure or heart health with provider; Provided education on prescribed diet heart healthy/ADA diet ;  Discussed complications of poorly controlled blood pressure such as heart disease, stroke, circulatory complications, vision complications, kidney impairment, sexual dysfunction;      History of CVA with brain hydrocephalus syndrome  (Status: Goal on Track (progressing): YES.) Long Term Goal   Evaluation of current treatment plan related to  History of CVA with brain hydrocephalus syndrome , Memory Deficits self-management and patient's adherence to plan as established by provider. Discussed plans with patient for ongoing care management follow up and provided patient with direct contact information for care management team Advised patient to call the office for changes in mood, anxiety, depression, or residual effects from past CVA with brain syndrome. His brother lives with him and assist in his care. He has a peer support person that also aides in management of his care. The patient has what he needs and follows up accordingly with the specialist and pcp; Provided education to patient re: changes in neurologist health and well being and to call the office for questions of concerns; Reviewed medications with patient and discussed compliance the patient is compliant with medications; Provided patient with neurological changes, safety concerns with ambulation and fall prevention educational materials related to past history of stroke with brain syndrome; Discussed plans with patient for ongoing care management follow up and provided patient with direct contact information for care management team; Screening for signs and symptoms of depression related to chronic disease state;  Assessed social determinant of health barriers;  The patient is looking into housing with  the [facility]. They have filled out paperwork and are waiting to hear back from the process. [name] is assisting the patient and his brother with the paperwork for this. Will continue to monitor.     Patient Goals/Self-Care Activities: Take medications as prescribed   Attend all scheduled provider appointments Call pharmacy for medication refills 3-7 days in advance of running out of medications Attend church or other social activities Perform all self care activities independently  Perform IADL's  (shopping, preparing meals, housekeeping, managing finances) independently Call provider office for new concerns or questions  Work with the social worker to address care coordination needs and will continue to work with the clinical team to address health care and disease management related needs call the Suicide and Crisis Lifeline: 988 call the [place] National Suicide Prevention Lifeline: (704)488-1975 or TTY: 272-063-8563 TTY 240-139-0713) to talk to a trained counselor call 1-800-273-TALK (toll free, 24 hour hotline) if experiencing a Mental Health or [place]  keep appointment with eye doctor check blood sugar at prescribed times: twice daily, when you have symptoms of low or high blood sugar, and before and after exercise check feet daily for cuts, sores or redness enter blood sugar readings and medication or insulin into daily log take the blood sugar log to all doctor visits trim toenails straight across. 08-15-2021: Saw the podiatrist recently and had nails trimmed. Will go back in 3 months. The patient will also be evaluated for diabetic shoes.  drink 6 to 8 glasses of water each day eat fish at least once per week fill half of plate with vegetables join a weight loss program limit fast food meals to no more than 1 per week manage portion size prepare main meal at home 3 to 5 days each week read food labels for fat, fiber, carbohydrates and  portion size reduce red meat to 2 to 3 times a week switch to sugar-free drinks do heel pump exercise 2 to 3 times each day keep feet up while sitting wash and dry feet carefully every day wear comfortable, cotton socks wear comfortable, well-fitting shoes check blood pressure 3 times per week choose a place to take my blood pressure (home, clinic or office, retail store) write blood pressure results in a log or diary learn about high blood pressure keep a blood pressure log take blood pressure log to all doctor  appointments call doctor for signs and symptoms of high blood pressure develop an action plan for high blood pressure keep all doctor appointments take medications for blood pressure exactly as prescribed report new symptoms to your doctor eat more whole grains, fruits and vegetables, lean meats and healthy fats - call for medicine refill 2 or 3 days before it runs out - take all medications exactly as prescribed - call doctor with any symptoms you believe are related to your medicine - call doctor when you experience any new symptoms - go to all doctor appointments as scheduled - adhere to prescribed diet: heart healthy/ADA diet  Our next appointment is by telephone on 10-10-2021 at 0930 am  Please call the care guide team at [phone] if you need to cancel or reschedule your appointment.   If you are experiencing a Mental Health or [place] or need someone to talk to, please call the Suicide and Crisis Lifeline: 988 call the [place] National Suicide Prevention Lifeline: (520)023-8048 or TTY: 515-584-6226 TTY 618-636-8482) to talk to a trained counselor call 1-800-273-TALK (toll free, 24 hour hotline)   Patient verbalizes understanding of instructions and care plan provided today and agrees to view in [place]. Active MyChart status confirmed with patient.    Noreene Larsson RN, MSN, [facility] Mobile: [phone]

## 2021-08-15 NOTE — Chronic Care Management (AMB) (Signed)
Chronic Care Management   CCM RN Visit Note  08/15/2021 Name: [name] MRN: [id number] DOB: 1971-02-20  Subjective: [name] is a 51 y.o. year old male who is a primary care patient of Cannady, Patrick Faster, NP. The care management team was consulted for assistance with disease management and care coordination needs.    Engaged with patient by telephone for follow up visit in response to provider referral for case management and/or care coordination services. Spoke to the patients brother, [name]. Also called and left a message on [employer]  Consent to Services:  The patient was given information about Chronic Care Management services, agreed to services, and gave verbal consent prior to initiation of services.  Please see initial visit note for detailed documentation.   Patient agreed to services and verbal consent obtained.   Assessment: Review of patient past medical history, allergies, medications, health status, including review of consultants reports, laboratory and other test data, was performed as part of comprehensive evaluation and provision of chronic care management services.   SDOH (Social Determinants of Health) assessments and interventions performed:    CCM Care Plan  No Known Allergies  Outpatient Encounter Medications as of 08/15/2021  Medication Sig   acetaminophen (TYLENOL) 325 MG tablet Take 2 tablets (650 mg total) by mouth every 6 (six) hours as needed for mild pain.   amLODipine (NORVASC) 10 MG tablet TAKE 1 TABLET BY MOUTH ONCE DAILY   ASPIR-LOW 81 MG EC tablet TAKE 1 TABLET BY MOUTH ONCE DAILY   atorvastatin (LIPITOR) 80 MG tablet TAKE 1 TABLET BY MOUTH ONCE DAILY AT 6PM   Blood Glucose Monitoring Suppl (ONETOUCH VERIO FLEX SYSTEM) w/Device KIT Use to check blood sugar at least 2-3 times a day, in morning fasting and then 2 hours after a meal.   Blood Pressure Monitoring (BLOOD PRESSURE MONITOR AUTOMAT) DEVI Use to monitor blood pressure once  daily.   carvedilol (COREG) 6.25 MG tablet TAKE 1 TABLET BY MOUTH TWICE DAILY WITH A MEAL   citalopram (CELEXA) 10 MG tablet TAKE 1 TABLET BY MOUTH ONCE DAILY   clopidogrel (PLAVIX) 75 MG tablet TAKE 1 TABLET BY MOUTH ONCE DAILY WITH BREAKFAST   donepezil (ARICEPT) 5 MG tablet Take 1 tablet by mouth daily.   gabapentin (NEURONTIN) 300 MG capsule Take 1 capsule (300 mg total) by mouth 3 (three) times daily.   glucose blood (ONETOUCH VERIO) test strip Use to check blood sugar at least 2-3 times a day, in morning fasting and 2 hours after a meal.   Lancet Devices (ACCU-CHEK SOFTCLIX) lancets Use as instructed   lisinopril (ZESTRIL) 5 MG tablet TAKE 1 TABLET BY MOUTH ONCE DAILY   metFORMIN (GLUCOPHAGE-XR) 500 MG 24 hr tablet Take 1 tablet (500 mg total) by mouth daily with breakfast.   Multiple Vitamin (MULTIVITAMIN) tablet Take 1 tablet by mouth daily.   mupirocin cream (BACTROBAN) 2 % Apply 1 application topically 2 (two) times daily.   MYRBETRIQ 50 MG TB24 tablet Take 50 mg by mouth daily.   tamsulosin (FLOMAX) 0.4 MG CAPS capsule Take 0.4 mg by mouth daily.   No facility-administered encounter medications on file as of 08/15/2021.    Patient Active Problem List   Diagnosis Date Noted   Poor mobility 09/06/2020   Type 2 diabetes mellitus with proteinuria ([place]) 12/31/2019   Incontinence of feces 12/10/2019   Urge incontinence of urine 12/10/2019   Chronic communicating hydrocephalus ([place]) 04/22/2019   Vitamin B12 deficiency 12/02/2018  Chronic pain of right ankle 12/18/2015   Obesity 10/16/2015   Hyperlipidemia associated with type 2 diabetes mellitus ([place]) 09/13/2015   History of stroke 12/17/2013   Hypertension associated with diabetes ([place]) 12/17/2013   Cardiomyopathy ([place]) 12/17/2013   Type 2 diabetes mellitus with diabetic polyneuropathy ([place]) 12/17/2013    Conditions to be addressed/monitored:HTN, HLD, DMII, and urinary incontinence, history of CVA with brain hydrocephalus  syndrome   Care Plan : RNCM: General Plan of Care (Adult) for Chronic Disease Management and Care Coordination Needs  Updates made by Vanita Ingles, RN since 08/15/2021 12:00 AM     Problem: RNCM: Development of Plan of care for Chronic Disease Management (HTN, HLD, DM, Urinary Incontinence, history of CVA with brain syndrome)   Priority: High     Long-Range Goal: RNCM: Effective Management of Plan of care for Chronic Disease Management (HTN, HLD, DM, Urinary Incontinence, history of CVA with brain syndrome)   Start Date: 08/15/2021  Expected End Date: 08/15/2022  Priority: High  Note:   Current Barriers:  Knowledge Deficits related to plan of care for management of HTN, HLD, DMII, and Urinary incontinence, and history of CVA with brain hydrocephalus syndrome   Care Coordination needs related to Level of care concerns and Mental Health Concerns   Chronic Disease Management support and education needs related to HTN, HLD, DMII, and urinary incontinence, history of CVA with brain hydrocephalus syndrome Lacks caregiver support.        Cognitive Deficits  RNCM Clinical Goal(s):  Patient will verbalize understanding of plan for management of HTN, HLD, DMII, and Urinary Incontinence and history of brain hydrocephalus syndrome as evidenced by keeping appointments, stable conditions, calling the office for questions or concerns and working with the CCM team to optimize health and well being  take all medications exactly as prescribed and will call provider for medication related questions as evidenced by compliance with medications and calling for refills before running out of medications    attend all scheduled medical appointments: keep appointments with pcp  as evidenced by compliance with routine office visits and calling  the office for schedule change needs         demonstrate improved and ongoing adherence to prescribed treatment plan for HTN, HLD, DMII, and urinary incontinence, and  history of  stroke with brain hydrocephalus  as evidenced by VS stable, lab work stable, no exacerbations of chronic conditions, and patient able to maintain health and well being continue to work with Consulting civil engineer and/or Social Worker to address care management and care coordination needs related to HTN, HLD, DMII, and urinary incontinence and history of stroke with brain syndrome as evidenced by adherence to CM Team Scheduled appointments     demonstrate ongoing self health care management ability effective management of chronic conditions as evidenced by working with the CCM team  through collaboration with Consulting civil engineer, provider, and care team.   Interventions: 1:1 collaboration with primary care provider regarding development and update of comprehensive plan of care as evidenced by provider attestation and co-signature Inter-disciplinary care team collaboration (see longitudinal plan of care) Evaluation of current treatment plan related to  self management and patient's adherence to plan as established by provider   Diabetes:  (Status: Goal on Track (progressing): YES.) Long Term Goal   Lab Results  Component Value Date   HGBA1C 5.5 02/16/2021    Assessed patient's understanding of A1c goal: <7% Provided education to patient about basic DM disease process; Reviewed medications  with patient and discussed importance of medication adherence. 08-15-2021; States compliance with medications         Reviewed prescribed diet with patient heart healthy/ADA diet. 08-15-2021: The brother denies any issues with appetite or following of dietary restrictions ; Counseled on importance of regular laboratory monitoring as prescribed. 08-15-2021: Has regular lab testing;        Discussed plans with patient for ongoing care management follow up and provided patient with direct contact information for care management team;      Provided patient with written educational materials related to hypo and hyperglycemia and  importance of correct treatment. 08-15-2021: Review of hypo and hyperglycemia. The patients brother assist with his care and denies any issues with his DM management ;       Advised patient, providing education and rationale, to check cbg twice daily, when you have symptoms of low or high blood sugar, and before and after exercise and record. 08-15-2021: Per the patients brother his blood sugars are ranging from 80-150's. Denies any issues with DM management.     call provider for findings outside established parameters;       Review of patient status, including review of consultants reports, relevant laboratory and other test results, and medications completed;       Screening for signs and symptoms of depression related to chronic disease state;        Assessed social determinant of health barriers;  Last vision test in December 2022 per the patients brother        Urinary Incontinence   (Status: Goal on Track (progressing): YES.) Long Term Goal  Evaluation of current treatment plan related to  Urinary incontinence , Limited social support, Level of care concerns, Mental Health Concerns , and Memory Deficits self-management and patient's adherence to plan as established by provider. Discussed plans with patient for ongoing care management follow up and provided patient with direct contact information for care management team Advised patient to call the specialist for changes in urinary health and well being, changes in urinary habits or worsening sx and sx of incontinence ; Provided education to patient re: routine follow up visits with the provider, monitoring for sx and sx of infection, especially UTI's. Calling the office for changes in urinary health and well being; Reviewed medications with patient and discussed compliance. The patient is taking Myrbetriq 50 mg and the patients brother states since he is taking this he is doing a lot better with not having "accidents". States the medication is  working well; Provided patient with urinary health and wellness  educational materials related to urinary incontinence ; Discussed plans with patient for ongoing care management follow up and provided patient with direct contact information for care management team; Advised patient to discuss changes in urinary habits, questions, or concerns with provider; Review of housing situation. The patient is trying to get housing through [facility]. Peer support person [name] is assisting the patient and his brother with this request. [name] states that he has not received any current updates about new housing at this time.   Hyperlipidemia:  (Status: Goal on Track (progressing): YES.) Long Term Goal  Lab Results  Component Value Date   CHOL 113 02/16/2021   HDL 61 02/16/2021   LDLCALC 39 02/16/2021   TRIG 56 02/16/2021   CHOLHDL 2.5 10/11/2015     Medication review performed; medication list updated in electronic medical record.  Provider established cholesterol goals reviewed; Counseled on importance of regular laboratory  monitoring as prescribed; Provided HLD educational materials; Reviewed role and benefits of statin for ASCVD risk reduction; Discussed strategies to manage statin-induced myalgias; Reviewed importance of limiting foods high in cholesterol;  Hypertension: (Status: Goal on Track (progressing): YES.) Long Term Goal  Last practice recorded BP readings:  BP Readings from Last 3 Encounters:  08/06/21 131/83  04/24/21 119/75  04/23/21 122/80  Most recent eGFR/CrCl:  Lab Results  Component Value Date   EGFR 87 02/16/2021    No components found for: CRCL  Evaluation of current treatment plan related to hypertension self management and patient's adherence to plan as established by provider;   Provided education to patient re: stroke prevention, s/s of heart attack and stroke; Reviewed prescribed diet heart healthy/ADA diet  Reviewed medications with  patient and discussed importance of compliance;  Discussed plans with patient for ongoing care management follow up and provided patient with direct contact information for care management team; Advised patient, providing education and rationale, to monitor blood pressure daily and record, calling PCP for findings outside established parameters;  Advised patient to discuss changes in blood pressure or heart health with provider; Provided education on prescribed diet heart healthy/ADA diet ;  Discussed complications of poorly controlled blood pressure such as heart disease, stroke, circulatory complications, vision complications, kidney impairment, sexual dysfunction;    History of CVA with brain hydrocephalus syndrome  (Status: Goal on Track (progressing): YES.) Long Term Goal  Evaluation of current treatment plan related to  History of CVA with brain hydrocephalus syndrome , Memory Deficits self-management and patient's adherence to plan as established by provider. Discussed plans with patient for ongoing care management follow up and provided patient with direct contact information for care management team Advised patient to call the office for changes in mood, anxiety, depression, or residual effects from past CVA with brain syndrome. His brother lives with him and assist in his care. He has a peer support person that also aides in management of his care. The patient has what he needs and follows up accordingly with the specialist and pcp; Provided education to patient re: changes in neurologist health and well being and to call the office for questions of concerns; Reviewed medications with patient and discussed compliance the patient is compliant with medications; Provided patient with neurological changes, safety concerns with ambulation and fall prevention educational materials related to past history of stroke with brain syndrome; Discussed plans with patient for ongoing care management follow  up and provided patient with direct contact information for care management team; Screening for signs and symptoms of depression related to chronic disease state;  Assessed social determinant of health barriers;  The patient is looking into housing with  the [facility]. They have filled out paperwork and are waiting to hear back from the process. [name] is assisting the patient and his brother with the paperwork for this. Will continue to monitor.    Patient Goals/Self-Care Activities: Take medications as prescribed   Attend all scheduled provider appointments Call pharmacy for medication refills 3-7 days in advance of running out of medications Attend church or other social activities Perform all self care activities independently  Perform IADL's (shopping, preparing meals, housekeeping, managing finances) independently Call provider office for new concerns or questions  Work with the social worker to address care coordination needs and will continue to work with the clinical team to address health care and disease management related needs call the Suicide and Crisis Lifeline: 988 call the [place] National  Suicide Prevention Lifeline: 6141154081 or TTY: (986)171-7601 TTY (380)069-3810) to talk to a trained counselor call 1-800-273-TALK (toll free, 24 hour hotline) if experiencing a Mental Health or [place]  keep appointment with eye doctor check blood sugar at prescribed times: twice daily, when you have symptoms of low or high blood sugar, and before and after exercise check feet daily for cuts, sores or redness enter blood sugar readings and medication or insulin into daily log take the blood sugar log to all doctor visits trim toenails straight across. 08-15-2021: Saw the podiatrist recently and had nails trimmed. Will go back in 3 months. The patient will also be evaluated for diabetic shoes.  drink 6 to 8 glasses of water each day eat fish at least  once per week fill half of plate with vegetables join a weight loss program limit fast food meals to no more than 1 per week manage portion size prepare main meal at home 3 to 5 days each week read food labels for fat, fiber, carbohydrates and portion size reduce red meat to 2 to 3 times a week switch to sugar-free drinks do heel pump exercise 2 to 3 times each day keep feet up while sitting wash and dry feet carefully every day wear comfortable, cotton socks wear comfortable, well-fitting shoes check blood pressure 3 times per week choose a place to take my blood pressure (home, clinic or office, retail store) write blood pressure results in a log or diary learn about high blood pressure keep a blood pressure log take blood pressure log to all doctor appointments call doctor for signs and symptoms of high blood pressure develop an action plan for high blood pressure keep all doctor appointments take medications for blood pressure exactly as prescribed report new symptoms to your doctor eat more whole grains, fruits and vegetables, lean meats and healthy fats - call for medicine refill 2 or 3 days before it runs out - take all medications exactly as prescribed - call doctor with any symptoms you believe are related to your medicine - call doctor when you experience any new symptoms - go to all doctor appointments as scheduled - adhere to prescribed diet: heart healthy/ADA diet       Plan:Telephone follow up appointment with care management team member scheduled for:  10-10-2021 at [place] am  Noreene Larsson RN, MSN, [facility] Mobile: [phone]

## 2021-08-16 ENCOUNTER — Telehealth: Payer: Self-pay | Admitting: Cardiology

## 2021-08-16 NOTE — Telephone Encounter (Signed)
Tried to reach the pt to schedule an appt for pre op clearance. VM not set up on [phone] which is listed as primary number to call. I will try the other number on file as well [phone], no answer or vm. I will update the requesting office that we are trying to reach the pt to make appt for pre op clearance. In hopes if the requesting office does s/w the pt if they may please let the pt know he needs to call his cardiologist office to make appt for pre op clearance.

## 2021-08-16 NOTE — Telephone Encounter (Signed)
° °  Pre-operative Risk Assessment    Patient Name: [name]  DOB: Nov 10, 1970 MRN: [id number]      Request for Surgical Clearance    Procedure:   PTNS (Percutaneous Tibinal Nerve Stimulation)  Date of Surgery:  Clearance TBD                                 Surgeon:  not indicated  Surgeon's Group or Practice Name:  [facility]  Phone number:  [phone] Fax number:  [phone]   Type of Clearance Requested:   - Medical    Type of Anesthesia:  Not Indicated   Additional requests/questions:    Courtney Heys   08/16/2021, 3:45 PM

## 2021-08-16 NOTE — Telephone Encounter (Signed)
° °  Name: [name]  DOB: [birth year]  MRN: [id number]  Primary Cardiologist: Debbe Odea, MD  Chart reviewed as part of pre-operative protocol coverage. Because of [name]'s past medical history and time since last visit, he will require a follow-up visit in order to better assess preoperative cardiovascular risk.  Pre-op covering staff: - Please schedule appointment and call patient to inform them. If patient already had an upcoming appointment within acceptable timeframe, please add "pre-op clearance" to the appointment notes so provider is aware. - Please contact requesting surgeon's office via preferred method (i.e, phone, fax) to inform them of need for appointment prior to surgery.  If applicable, this message will also be routed to pharmacy pool and/or primary cardiologist for input on holding anticoagulant/antiplatelet agent as requested below so that this information is available to the clearing provider at time of patient's appointment.   [place], [place]  08/16/2021, 4:31 PM

## 2021-08-17 NOTE — Telephone Encounter (Signed)
Pt has been scheduled for pre op appt with Ward Givens, NP 08/22/21 @ 3:10 in the [place] office. Pt thanked me for the call and the help. I will forward notes to NP for upcoming appt. Will send FYI to requesting office the pt has appt 08/22/21

## 2021-08-20 ENCOUNTER — Ambulatory Visit: Payer: Medicare Other

## 2021-08-22 ENCOUNTER — Encounter: Payer: Self-pay | Admitting: Nurse Practitioner

## 2021-08-22 ENCOUNTER — Ambulatory Visit: Payer: Medicare Other | Admitting: Nurse Practitioner

## 2021-08-22 NOTE — Progress Notes (Unsigned)
Office Visit    Patient Name: [name] Date of Encounter: 08/22/2021  Primary Care Provider:  Venita Lick, NP Primary Cardiologist:  Kate Sable, MD  Chief Complaint    51 year old male with a history of hypertension, hyperlipidemia, diabetes, diastolic dysfunction, stroke, and urge incontinence, who presents for follow-up related to ***  Past Medical History    Past Medical History:  Diagnosis Date   Depression    Diabetes mellitus without complication ([place])    Diastolic dysfunction    a. 09/2020 Echo: EF 50-55%, no rwma, GrII DD. Nl RV size/fxn.  Mildly dil LA.   Hydrocephalus in adult [facility])    Hyperlipidemia    Hypertension    Seizures ([place])    Stroke [facility]    Past Surgical History:  Procedure Laterality Date   COLONOSCOPY WITH PROPOFOL N/A 03/08/2020   Procedure: COLONOSCOPY WITH PROPOFOL;  Surgeon: Virgel Manifold, MD;  Location: [facility] ENDOSCOPY;  Service: Endoscopy;  Laterality: N/A;   LOOP RECORDER IMPLANT  12/20/13   MDT LinQ implanted by Dr Lovena Le for cryptogenic stroke   LOOP RECORDER IMPLANT N/A 12/20/2013   Procedure: LOOP RECORDER IMPLANT;  Surgeon: Evans Lance, MD;  Location: [facility] CATH LAB;  Service: Cardiovascular;  Laterality: N/A;    Allergies  No Known Allergies  History of Present Illness    51 year old male with the above past medical history including hypertension, hyperlipidemia, diabetes, diastolic dysfunction, stroke, and urge incontinence.  He previously suffered a stroke in 2015 and underwent implantable loop recorder placement, without any findings of significant arrhythmias.  Echocardiogram performed in March 2022 showed an EF of 50 to 55% with grade 2 diastolic dysfunction.  He was last seen in cardiology clinic in March 2022, at which time he was doing well with stable blood pressure and lipids.  Since his last visit,  In the setting of urgent continence, he has been evaluated by urology, and placed on Flomax and most  recently [place]  He is being considered for percutaneous tibial nerve stimulation.  Home Medications    Current Outpatient Medications  Medication Sig Dispense Refill   acetaminophen (TYLENOL) 325 MG tablet Take 2 tablets (650 mg total) by mouth every 6 (six) hours as needed for mild pain. 30 tablet 0   amLODipine (NORVASC) 10 MG tablet TAKE 1 TABLET BY MOUTH ONCE DAILY 90 tablet 0   ASPIR-LOW 81 MG EC tablet TAKE 1 TABLET BY MOUTH ONCE DAILY 30 tablet 0   atorvastatin (LIPITOR) 80 MG tablet TAKE 1 TABLET BY MOUTH ONCE DAILY AT 6PM 90 tablet 0   Blood Glucose Monitoring Suppl (ONETOUCH VERIO FLEX SYSTEM) w/Device KIT Use to check blood sugar at least 2-3 times a day, in morning fasting and then 2 hours after a meal. 1 kit 1   Blood Pressure Monitoring (BLOOD PRESSURE MONITOR AUTOMAT) DEVI Use to monitor blood pressure once daily. 1 each 1   carvedilol (COREG) 6.25 MG tablet TAKE 1 TABLET BY MOUTH TWICE DAILY WITH A MEAL 180 tablet 0   citalopram (CELEXA) 10 MG tablet TAKE 1 TABLET BY MOUTH ONCE DAILY 90 tablet 0   clopidogrel (PLAVIX) 75 MG tablet TAKE 1 TABLET BY MOUTH ONCE DAILY WITH BREAKFAST 90 tablet 4   donepezil (ARICEPT) 5 MG tablet Take 1 tablet by mouth daily.     gabapentin (NEURONTIN) 300 MG capsule Take 1 capsule (300 mg total) by mouth 3 (three) times daily. 270 capsule 4   glucose blood (ONETOUCH VERIO)  test strip Use to check blood sugar at least 2-3 times a day, in morning fasting and 2 hours after a meal. 100 each 12   Lancet Devices (ACCU-CHEK SOFTCLIX) lancets Use as instructed 1 each 0   lisinopril (ZESTRIL) 5 MG tablet TAKE 1 TABLET BY MOUTH ONCE DAILY 90 tablet 0   metFORMIN (GLUCOPHAGE-XR) 500 MG 24 hr tablet Take 1 tablet (500 mg total) by mouth daily with breakfast. 90 tablet 4   Multiple Vitamin (MULTIVITAMIN) tablet Take 1 tablet by mouth daily.     mupirocin cream (BACTROBAN) 2 % Apply 1 application topically 2 (two) times daily. 15 g 0   MYRBETRIQ 50 MG TB24  tablet Take 50 mg by mouth daily.     tamsulosin (FLOMAX) 0.4 MG CAPS capsule Take 0.4 mg by mouth daily.     No current facility-administered medications for this visit.     Review of Systems    ***.  All other systems reviewed and are otherwise negative except as noted above.    Physical Exam    VS:  There were no vitals taken for this visit. , BMI There is no height or weight on file to calculate BMI.     GEN: Well nourished, well developed, in no acute distress. HEENT: normal. Neck: Supple, no JVD, carotid bruits, or masses. Cardiac: RRR, no murmurs, rubs, or gallops. No clubbing, cyanosis, edema.  Radials/DP/PT 2+ and equal bilaterally.  Respiratory:  Respirations regular and unlabored, clear to auscultation bilaterally. GI: Soft, nontender, nondistended, BS + x 4. MS: no deformity or atrophy. Skin: warm and dry, no rash. Neuro:  Strength and sensation are intact. Psych: Normal affect.  Accessory Clinical Findings    ECG personally reviewed by me today - *** - no acute changes.  Lab Results  Component Value Date   WBC 4.2 07/13/2020   HGB 13.6 07/13/2020   HCT 41.4 07/13/2020   MCV 94.1 07/13/2020   PLT 205 07/13/2020   Lab Results  Component Value Date   CREATININE 1.06 02/16/2021   BUN 10 02/16/2021   NA 137 02/16/2021   K 4.9 02/16/2021   CL 95 (L) 02/16/2021   CO2 21 02/16/2021   Lab Results  Component Value Date   ALT 18 07/18/2020   AST 17 07/18/2020   ALKPHOS 71 07/18/2020   BILITOT 0.8 07/18/2020   Lab Results  Component Value Date   CHOL 113 02/16/2021   HDL 61 02/16/2021   LDLCALC 39 02/16/2021   TRIG 56 02/16/2021   CHOLHDL 2.5 10/11/2015    Lab Results  Component Value Date   HGBA1C 5.5 02/16/2021    Assessment & Plan    1.  ***   Murray Hodgkins, NP 08/22/2021, 3:13 PM

## 2021-08-23 ENCOUNTER — Encounter: Payer: Self-pay | Admitting: Nurse Practitioner

## 2021-08-27 ENCOUNTER — Ambulatory Visit: Payer: Medicare Other

## 2021-08-29 ENCOUNTER — Other Ambulatory Visit: Payer: Self-pay | Admitting: Nurse Practitioner

## 2021-08-30 NOTE — Telephone Encounter (Signed)
Requested medication (s) are due for refill today - yes- expired Rx  Requested medication (s) are on the active medication list -yes  Future visit scheduled -yes  Last refill: 07/18/20   Notes to clinic: Call to patient- follow up appointment scheduled- request fails lab protocol, expired Rx- request sent for review   Requested Prescriptions  Pending Prescriptions Disp Refills   gabapentin (NEURONTIN) 300 MG capsule [Pharmacy Med Name: GABAPENTIN 300 MG CAP] 270 capsule 4    Sig: TAKE 1 CAPSULE BY MOUTH 3 TIMES DAILY     Neurology: Anticonvulsants - gabapentin Passed - 08/29/2021  5:45 PM      Passed - Cr in normal range and within 360 days    Creatinine  Date Value Ref Range Status  07/07/2014 1.19 0.60 - 1.30 mg/dL Final   Creatinine, Ser  Date Value Ref Range Status  02/16/2021 1.06 0.76 - 1.27 mg/dL Final          Passed - Completed PHQ-2 or PHQ-9 in the last 360 days      Passed - Valid encounter within last 12 months    Recent Outpatient Visits           4 months ago Great toe pain, left   [place], Lauren A, NP   6 months ago Type 2 diabetes mellitus with diabetic polyneuropathy, without long-term current use of insulin ([place])   [place], [name] T, NP   8 months ago Type 2 diabetes mellitus with proteinuria ([place])   [place], [name] T, NP   9 months ago Type 2 diabetes mellitus with proteinuria ([place])   [place], [place] T, NP   11 months ago Poor mobility   [employer], [place] T, NP       Future Appointments             In 6 days Cannady, Barbaraann Faster, NP [facility]             clopidogrel (PLAVIX) 75 MG tablet [Pharmacy Med Name: CLOPIDOGREL BISULFATE 75 MG TAB] 90 tablet 4    Sig: TAKE 1 TABLET BY MOUTH ONCE EVERY MORNING WITH BREAKFAST     Hematology: Antiplatelets - clopidogrel Failed - 08/29/2021  5:45 PM      Failed -  HCT in normal range and within 180 days    HCT  Date Value Ref Range Status  07/13/2020 41.4 39.0 - 52.0 % Final   Hematocrit  Date Value Ref Range Status  09/02/2018 38.0 37.5 - 51.0 % Final          Failed - HGB in normal range and within 180 days    Hemoglobin  Date Value Ref Range Status  07/13/2020 13.6 13.0 - 17.0 g/dL Final  09/02/2018 13.1 13.0 - 17.7 g/dL Final          Failed - PLT in normal range and within 180 days    Platelets  Date Value Ref Range Status  07/13/2020 205 150 - 400 K/uL Final  09/02/2018 275 150 - 450 x10E3/uL Final          Passed - Cr in normal range and within 360 days    Creatinine  Date Value Ref Range Status  07/07/2014 1.19 0.60 - 1.30 mg/dL Final   Creatinine, Ser  Date Value Ref Range Status  02/16/2021 1.06 0.76 - 1.27 mg/dL Final  Passed - Valid encounter within last 6 months    Recent Outpatient Visits           4 months ago Great toe pain, left   [facility] [place], Lauren A, NP   6 months ago Type 2 diabetes mellitus with diabetic polyneuropathy, without long-term current use of insulin ([place])   [place], [name] T, NP   8 months ago Type 2 diabetes mellitus with proteinuria ([place])   [place], [name] T, NP   9 months ago Type 2 diabetes mellitus with proteinuria ([place])   [place], [place] T, NP   11 months ago Poor mobility   [employer], [place] T, NP       Future Appointments             In 6 days Cannady, Barbaraann Faster, NP [facility]               Requested Prescriptions  Pending Prescriptions Disp Refills   gabapentin (NEURONTIN) 300 MG capsule [Pharmacy Med Name: GABAPENTIN 300 MG CAP] 270 capsule 4    Sig: TAKE 1 CAPSULE BY MOUTH 3 TIMES DAILY     Neurology: Anticonvulsants - gabapentin Passed - 08/29/2021  5:45 PM      Passed - Cr in normal range and within 360 days     Creatinine  Date Value Ref Range Status  07/07/2014 1.19 0.60 - 1.30 mg/dL Final   Creatinine, Ser  Date Value Ref Range Status  02/16/2021 1.06 0.76 - 1.27 mg/dL Final          Passed - Completed PHQ-2 or PHQ-9 in the last 360 days      Passed - Valid encounter within last 12 months    Recent Outpatient Visits           4 months ago Great toe pain, left   [place], Lauren A, NP   6 months ago Type 2 diabetes mellitus with diabetic polyneuropathy, without long-term current use of insulin ([place])   [place], [name] T, NP   8 months ago Type 2 diabetes mellitus with proteinuria ([place])   [place], [name] T, NP   9 months ago Type 2 diabetes mellitus with proteinuria ([place])   [place], [place] T, NP   11 months ago Poor mobility   [employer], [place] T, NP       Future Appointments             In 6 days Cannady, Barbaraann Faster, NP [facility]             clopidogrel (PLAVIX) 75 MG tablet [Pharmacy Med Name: CLOPIDOGREL BISULFATE 75 MG TAB] 90 tablet 4    Sig: TAKE 1 TABLET BY MOUTH ONCE EVERY MORNING WITH BREAKFAST     Hematology: Antiplatelets - clopidogrel Failed - 08/29/2021  5:45 PM      Failed - HCT in normal range and within 180 days    HCT  Date Value Ref Range Status  07/13/2020 41.4 39.0 - 52.0 % Final   Hematocrit  Date Value Ref Range Status  09/02/2018 38.0 37.5 - 51.0 % Final          Failed - HGB in normal range and within 180 days    Hemoglobin  Date Value Ref Range Status  07/13/2020 13.6 13.0 - 17.0 g/dL Final  09/02/2018  13.1 13.0 - 17.7 g/dL Final          Failed - PLT in normal range and within 180 days    Platelets  Date Value Ref Range Status  07/13/2020 205 150 - 400 K/uL Final  09/02/2018 275 150 - 450 x10E3/uL Final          Passed - Cr in normal range and within 360 days    Creatinine  Date Value Ref  Range Status  07/07/2014 1.19 0.60 - 1.30 mg/dL Final   Creatinine, Ser  Date Value Ref Range Status  02/16/2021 1.06 0.76 - 1.27 mg/dL Final          Passed - Valid encounter within last 6 months    Recent Outpatient Visits           4 months ago Great toe pain, left   [facility] McElwee, Lauren A, NP   6 months ago Type 2 diabetes mellitus with diabetic polyneuropathy, without long-term current use of insulin ([place])   [place], [name] T, NP   8 months ago Type 2 diabetes mellitus with proteinuria ([place])   [place], [name] T, NP   9 months ago Type 2 diabetes mellitus with proteinuria ([place])   [place] Cannady, [name] T, NP   11 months ago Poor mobility   [place], Barbaraann Faster, NP       Future Appointments             In 6 days Cannady, Barbaraann Faster, NP [facility]

## 2021-08-31 NOTE — Progress Notes (Unsigned)
Pt not seen. Needs cardiac clearance before he can start PTNS.

## 2021-09-02 NOTE — Patient Instructions (Incomplete)

## 2021-09-03 ENCOUNTER — Ambulatory Visit: Payer: Medicare Other

## 2021-09-04 DIAGNOSIS — E1159 Type 2 diabetes mellitus with other circulatory complications: Secondary | ICD-10-CM

## 2021-09-04 DIAGNOSIS — E1169 Type 2 diabetes mellitus with other specified complication: Secondary | ICD-10-CM

## 2021-09-04 DIAGNOSIS — E1142 Type 2 diabetes mellitus with diabetic polyneuropathy: Secondary | ICD-10-CM

## 2021-09-04 DIAGNOSIS — I10 Essential (primary) hypertension: Secondary | ICD-10-CM

## 2021-09-04 DIAGNOSIS — E785 Hyperlipidemia, unspecified: Secondary | ICD-10-CM

## 2021-09-04 DIAGNOSIS — I152 Hypertension secondary to endocrine disorders: Secondary | ICD-10-CM

## 2021-09-05 ENCOUNTER — Ambulatory Visit: Payer: Medicare Other | Admitting: Nurse Practitioner

## 2021-09-05 DIAGNOSIS — Z1159 Encounter for screening for other viral diseases: Secondary | ICD-10-CM

## 2021-09-05 DIAGNOSIS — E1129 Type 2 diabetes mellitus with other diabetic kidney complication: Secondary | ICD-10-CM

## 2021-09-05 DIAGNOSIS — E1169 Type 2 diabetes mellitus with other specified complication: Secondary | ICD-10-CM

## 2021-09-05 DIAGNOSIS — I429 Cardiomyopathy, unspecified: Secondary | ICD-10-CM

## 2021-09-05 DIAGNOSIS — G910 Communicating hydrocephalus: Secondary | ICD-10-CM

## 2021-09-05 DIAGNOSIS — N3941 Urge incontinence: Secondary | ICD-10-CM

## 2021-09-05 DIAGNOSIS — E1142 Type 2 diabetes mellitus with diabetic polyneuropathy: Secondary | ICD-10-CM

## 2021-09-05 DIAGNOSIS — E6609 Other obesity due to excess calories: Secondary | ICD-10-CM

## 2021-09-05 DIAGNOSIS — I152 Hypertension secondary to endocrine disorders: Secondary | ICD-10-CM

## 2021-09-05 DIAGNOSIS — Z8673 Personal history of transient ischemic attack (TIA), and cerebral infarction without residual deficits: Secondary | ICD-10-CM

## 2021-09-05 DIAGNOSIS — Z7409 Other reduced mobility: Secondary | ICD-10-CM

## 2021-09-09 NOTE — Patient Instructions (Signed)

## 2021-09-10 ENCOUNTER — Other Ambulatory Visit: Payer: Self-pay

## 2021-09-10 ENCOUNTER — Ambulatory Visit (INDEPENDENT_AMBULATORY_CARE_PROVIDER_SITE_OTHER): Payer: Medicare Other | Admitting: Nurse Practitioner

## 2021-09-10 ENCOUNTER — Ambulatory Visit: Payer: Medicare Other

## 2021-09-10 ENCOUNTER — Encounter: Payer: Self-pay | Admitting: Nurse Practitioner

## 2021-09-10 VITALS — BP 112/77 | HR 75 | Ht 73.5 in | Wt 275.0 lb

## 2021-09-10 DIAGNOSIS — Z8673 Personal history of transient ischemic attack (TIA), and cerebral infarction without residual deficits: Secondary | ICD-10-CM

## 2021-09-10 DIAGNOSIS — E538 Deficiency of other specified B group vitamins: Secondary | ICD-10-CM

## 2021-09-10 DIAGNOSIS — I152 Hypertension secondary to endocrine disorders: Secondary | ICD-10-CM

## 2021-09-10 DIAGNOSIS — E1129 Type 2 diabetes mellitus with other diabetic kidney complication: Secondary | ICD-10-CM | POA: Diagnosis not present

## 2021-09-10 DIAGNOSIS — Z1159 Encounter for screening for other viral diseases: Secondary | ICD-10-CM

## 2021-09-10 DIAGNOSIS — E1142 Type 2 diabetes mellitus with diabetic polyneuropathy: Secondary | ICD-10-CM

## 2021-09-10 DIAGNOSIS — Z6835 Body mass index (BMI) 35.0-35.9, adult: Secondary | ICD-10-CM

## 2021-09-10 DIAGNOSIS — E1159 Type 2 diabetes mellitus with other circulatory complications: Secondary | ICD-10-CM

## 2021-09-10 DIAGNOSIS — G910 Communicating hydrocephalus: Secondary | ICD-10-CM

## 2021-09-10 DIAGNOSIS — E1169 Type 2 diabetes mellitus with other specified complication: Secondary | ICD-10-CM | POA: Diagnosis not present

## 2021-09-10 DIAGNOSIS — I429 Cardiomyopathy, unspecified: Secondary | ICD-10-CM

## 2021-09-10 DIAGNOSIS — N3941 Urge incontinence: Secondary | ICD-10-CM

## 2021-09-10 DIAGNOSIS — E785 Hyperlipidemia, unspecified: Secondary | ICD-10-CM

## 2021-09-10 DIAGNOSIS — R809 Proteinuria, unspecified: Secondary | ICD-10-CM

## 2021-09-10 LAB — BAYER DCA HB A1C WAIVED: HB A1C (BAYER DCA - WAIVED): 7.6 % — ABNORMAL HIGH (ref 4.8–5.6)

## 2021-09-10 MED ORDER — METFORMIN HCL ER 500 MG PO TB24: 1000.0000 mg | ORAL_TABLET | Freq: Every day | ORAL | 4 refills | Status: DC

## 2021-09-10 MED ORDER — DONEPEZIL HCL 5 MG PO TABS: 5.0000 mg | ORAL_TABLET | Freq: Every day | ORAL | 4 refills | Status: DC

## 2021-09-10 MED ORDER — VITAMIN B-12 1000 MCG PO TABS: 1000.0000 ug | ORAL_TABLET | Freq: Every day | ORAL | 4 refills | Status: DC

## 2021-09-10 NOTE — Assessment & Plan Note (Addendum)
Chronic, stable. Recheck next visit.  Continue daily supplement which is benefiting neuropathy. ?

## 2021-09-10 NOTE — Progress Notes (Signed)
BP 112/77    Pulse 75    Ht 6' 1.5" (1.867 m)    Wt 275 lb (124.7 kg)    SpO2 96%    BMI 35.79 kg/m    Subjective:    Patient ID: [name], male    DOB: 26-Feb-1971, 51 y.o.   MRN: [id number]  HPI: [name] is a 51 y.o. male  Chief Complaint  Patient presents with   Diabetes   Hyperlipidemia   Hypertension   Cerebrovascular Accident   Medication Refill    Patient brother states they received patient's medication and patient was missing his prescription for Urination. Patient    DIABETES Continues on Metformin XR 500 MG daily with last A1C 5.5% in August 2022 and urine protein 1+.  Endorses intermittent poor diet choices.    He reports drinking about 2-4 beers a day and liquor on weekends, staying about baseline.  History of B12 deficiency with last level improved with supplement.    Hypoglycemic episodes:no Polydipsia/polyuria: no Visual disturbance: no Chest pain: no Paresthesias: no Glucose Monitoring: yes             Accucheck frequency: daily             Fasting glucose: none over 140-150 range             Post prandial:             Evening:             Before meals: Taking Insulin?: no             Long acting insulin:             Short acting insulin: Blood Pressure Monitoring: daily Retinal Examination: Up To Date Foot Exam: Up to Date Pneumovax: Up to Date Influenza: up to date Aspirin: yes   HYPERTENSION / HYPERLIPIDEMIA Continues on Amlodipine 10 MG, ASA, Plavix, Carvedilol 6.25 MG BID, Lisinopril 5 MG QDAY, and Lipitor 80 MG daily.  History of CVA, continues on Aricept for memory changes.  Has underlying chronic communicating hydrocephalus, no recent visit with neurology.   Satisfied with current treatment? yes Duration of hypertension: chronic BP monitoring frequency: not checking BP range: not checking BP medication side effects: no Duration of hyperlipidemia: chronic Cholesterol medication side effects: no Cholesterol supplements:  none Medication compliance: good compliance Aspirin: yes Recent stressors: no Recurrent headaches: no Visual changes: no Palpitations: no Dyspnea: no Chest pain: no Lower extremity edema: no Dizzy/lightheaded: no   DEPRESSION Continues on Celexa 10 MG daily. Mood status: stable Satisfied with current treatment?: yes Symptom severity: moderate  Duration of current treatment : chronic Side effects: no Medication compliance: good compliance Psychotherapy/counseling: in the past Depressed mood:none Anxious mood: no Anhedonia: no Significant weight loss or gain: no Insomnia: none Fatigue: no Feelings of worthlessness or guilt: no Impaired concentration/indecisiveness: no Suicidal ideations: no Hopelessness: no Crying spells: no Depression screen [facility] 2/9 09/10/2021 07/10/2021 04/24/2021 04/24/2021 04/03/2021  Decreased Interest 0 0 0 0 0  Down, Depressed, Hopeless 0 0 0 0 0  PHQ - 2 Score 0 0 0 0 0  Altered sleeping 0 - 0 - -  Tired, decreased energy 0 - 0 - -  Change in appetite 0 - 0 - -  Feeling bad or failure about yourself  0 - 0 - -  Trouble concentrating 0 - 1 - -  Moving slowly or fidgety/restless 0 - 0 - -  Suicidal thoughts 0 -  0 - -  PHQ-9 Score 0 - 1 - -  Difficult doing work/chores Not difficult at all - Not difficult at all - -  Some recent data might be hidden    URINARY INCONTINENCE Followed by urology with last visit 08/06/21 with Dr. Sherron Monday.  They gave him samples of Gemtesa which have offered great benefit -- not urinating as much and able to hold.  Has tried Myrbetriq, Trospium, and Flomax.  They are consider PTN stimulation.   Status: uncontrolled Satisfied with current treatment?: yes Medication side effects: no Medication compliance: good compliance Duration: chronic Nocturia: 2-3x per night Urinary frequency:yes Incomplete voiding: no Urgency: yes Weak urinary stream: no Straining to start stream: no Dysuria: no Onset: gradual Severity:  moderate  Relevant past medical, surgical, family and social history reviewed and updated as indicated. Interim medical history since our last visit reviewed. Allergies and medications reviewed and updated.  Review of Systems  Constitutional:  Negative for activity change, diaphoresis, fatigue and fever.  Respiratory:  Negative for cough, chest tightness, shortness of breath and wheezing.   Cardiovascular:  Negative for chest pain, palpitations and leg swelling.  Gastrointestinal:  Negative for abdominal distention, abdominal pain, constipation, diarrhea, nausea and vomiting.  Endocrine: Negative for cold intolerance, heat intolerance, polydipsia, polyphagia and polyuria.  Neurological:  Negative for dizziness, syncope, weakness, light-headedness, numbness and headaches.  Psychiatric/Behavioral: Negative.     Per HPI unless specifically indicated above     Objective:    BP 112/77    Pulse 75    Ht 6' 1.5" (1.867 m)    Wt 275 lb (124.7 kg)    SpO2 96%    BMI 35.79 kg/m   Wt Readings from Last 3 Encounters:  09/10/21 275 lb (124.7 kg)  08/06/21 275 lb (124.7 kg)  04/24/21 272 lb (123.4 kg)    Physical Exam Vitals and nursing note reviewed.  Constitutional:      General: He is awake. He is not in acute distress.    Appearance: He is well-developed. He is obese. He is not ill-appearing.  HENT:     Head: Normocephalic and atraumatic.     Right Ear: Hearing normal. No drainage.     Left Ear: Hearing normal. No drainage.  Eyes:     General: Lids are normal.        Right eye: No discharge.        Left eye: No discharge.     Conjunctiva/sclera: Conjunctivae normal.     Pupils: Pupils are equal, round, and reactive to light.  Neck:     Thyroid: No thyromegaly.     Vascular: No carotid bruit.  Cardiovascular:     Rate and Rhythm: Normal rate and regular rhythm.     Heart sounds: Normal heart sounds, S1 normal and S2 normal. No murmur heard.   No gallop.  Pulmonary:     Effort:  Pulmonary effort is normal. No accessory muscle usage or respiratory distress.     Breath sounds: Normal breath sounds.  Abdominal:     General: Bowel sounds are normal.     Palpations: Abdomen is soft.  Musculoskeletal:        General: Normal range of motion.     Cervical back: Normal range of motion and neck supple.     Right lower leg: No edema.     Left lower leg: No edema.     Comments: Utilizing electric wheel chair today.  Skin:    General: Skin is  warm and dry.     Capillary Refill: Capillary refill takes less than 2 seconds.  Neurological:     Mental Status: He is alert and oriented to person, place, and time.  Psychiatric:        Attention and Perception: Attention normal.        Mood and Affect: Mood normal.        Speech: Speech normal.        Behavior: Behavior normal. Behavior is cooperative.    Results for orders placed or performed in visit on 03/05/21  Microscopic Examination   Urine  Result Value Ref Range   WBC, UA 0-5 0 - 5 /hpf   RBC 3-10 (A) 0 - 2 /hpf   Epithelial Cells (non renal) 0-10 0 - 10 /hpf   Bacteria, UA None seen None seen/Few  Urinalysis, Complete  Result Value Ref Range   Specific Gravity, UA 1.015 1.005 - 1.030   pH, UA 7.5 5.0 - 7.5   Color, UA Yellow Yellow   Appearance Ur Hazy (A) Clear   Leukocytes,UA Negative Negative   Protein,UA 1+ (A) Negative/Trace   Glucose, UA Negative Negative   Ketones, UA Negative Negative   RBC, UA Trace (A) Negative   Bilirubin, UA Negative Negative   Urobilinogen, Ur 2.0 (H) 0.2 - 1.0 mg/dL   Nitrite, UA Negative Negative   Microscopic Examination See below:       Assessment & Plan:   Problem List Items Addressed This Visit       Cardiovascular and Mediastinum   Cardiomyopathy (HCC)    Stable, followed by cardiology, continue this collaboration.      Hypertension associated with diabetes (HCC)    Chronic, ongoing.  BP at goal today.  Continue current medication regimen and adjust as  needed.  Recommend checking BP daily at home and documenting for provider. He has a BP cuff available at his home. CMP and TSH + urine ALB today.  Refills up to date.  Return in 3 months for f/u.      Relevant Medications   metFORMIN (GLUCOPHAGE-XR) 500 MG 24 hr tablet   Other Relevant Orders   Bayer DCA Hb A1c Waived   CBC with Differential/Platelet   Comprehensive metabolic panel   TSH   Microalbumin, Urine Waived     Endocrine   Hyperlipidemia associated with type 2 diabetes mellitus (HCC)    Chronic, ongoing.  Continue current medication regimen and adjust as needed.  Lipid panel today.  Refills up to date. Return in 3 months.      Relevant Medications   metFORMIN (GLUCOPHAGE-XR) 500 MG 24 hr tablet   Other Relevant Orders   Bayer DCA Hb A1c Waived   Comprehensive metabolic panel   Lipid Panel w/o Chol/HDL Ratio   Type 2 diabetes mellitus with diabetic polyneuropathy (HCC)    Chronic, ongoing.  A1C 7.6% today.  Increase Metformin XR back to 1000 MG QDAY -- script sent, he denies GI issues.  Due to history of hypoglycemia, which he has had in past, will avoid restart of Glipizide at this time and if elevation in future consider SGLT2 or GLP1 add on. Continue Gabapentin for discomfort. Recommend he focus on diet, as still tends to snack a lot at home, and avoid alcohol use. Continue checking BS daily at home.  Return in 3 months.      Relevant Medications   metFORMIN (GLUCOPHAGE-XR) 500 MG 24 hr tablet   donepezil (ARICEPT) 5 MG tablet  Other Relevant Orders   Bayer DCA Hb A1c Waived   Comprehensive metabolic panel   Microalbumin, Urine Waived   Type 2 diabetes mellitus with proteinuria (HCC) - Primary    Chronic, ongoing.  A1C 7.6% today.  Increase Metformin XR to 1000 MG QDAY -- script sent, he denies GI issues.  Due to history of hypoglycemia, which he has had in past, will avoid restart of Glipizide at this time and if elevation consider SGLT or GLP add on. Continue  Gabapentin for discomfort. Recommend he focus on diet, as still tends to snack a lot at home, and avoid alcohol use. Continue checking BS daily at home.  Return in 3 months.      Relevant Medications   metFORMIN (GLUCOPHAGE-XR) 500 MG 24 hr tablet   Other Relevant Orders   Bayer DCA Hb A1c Waived   Comprehensive metabolic panel   Microalbumin, Urine Waived     Nervous and Auditory   Chronic communicating hydrocephalus (HCC)    Ongoing, continue collaboration with neurology as needed and current memory medication as ordered by them in past.        Other   History of stroke    Continue collaboration with neurology.      Obesity    BMI 35.79 with T2DM, HTN.  Recommended eating smaller high protein, low fat meals more frequently and exercising 30 mins a day 5 times a week with a goal of 10-15lb weight loss in the next 3 months. Patient voiced their understanding and motivation to adhere to these recommendations.  Recommend continue cut back on alcohol.      Relevant Medications   metFORMIN (GLUCOPHAGE-XR) 500 MG 24 hr tablet   Urge incontinence of urine    Followed by urology, continue this collaboration and medications as prescribed by them.  Recent note reviewed.      Vitamin B12 deficiency    Chronic, stable. Recheck next visit.  Continue daily supplement which is benefiting neuropathy.      Other Visit Diagnoses     Need for hepatitis C screening test       Hep C screening on labs today, discussed with patient.   Relevant Orders   Hepatitis C antibody        Follow up plan: Return in about 3 months (around 12/11/2021) for T2DM, HTN/HLD, MOOD, URINE.

## 2021-09-10 NOTE — Assessment & Plan Note (Signed)
Followed by urology, continue this collaboration and medications as prescribed by them.  Recent note reviewed. 

## 2021-09-10 NOTE — Assessment & Plan Note (Signed)
Continue collaboration with neurology. 

## 2021-09-10 NOTE — Assessment & Plan Note (Signed)
Stable, followed by cardiology, continue this collaboration. 

## 2021-09-10 NOTE — Assessment & Plan Note (Signed)
Ongoing, continue collaboration with neurology as needed and current memory medication as ordered by them in past. ?

## 2021-09-10 NOTE — Assessment & Plan Note (Signed)
Chronic, ongoing.  BP at goal today.  Continue current medication regimen and adjust as needed.  Recommend checking BP daily at home and documenting for provider. He has a BP cuff available at his home. CMP and TSH + urine ALB today.  Refills up to date.  Return in 3 months for f/u. ?

## 2021-09-10 NOTE — Assessment & Plan Note (Signed)
Chronic, ongoing.  Continue current medication regimen and adjust as needed.  Lipid panel today.  Refills up to date. Return in 3 months. 

## 2021-09-10 NOTE — Assessment & Plan Note (Signed)
BMI 35.79 with T2DM, HTN.  Recommended eating smaller high protein, low fat meals more frequently and exercising 30 mins a day 5 times a week with a goal of 10-15lb weight loss in the next 3 months. Patient voiced their understanding and motivation to adhere to these recommendations.  Recommend continue cut back on alcohol. ?

## 2021-09-10 NOTE — Assessment & Plan Note (Signed)
Chronic, ongoing.  A1C 7.6% today.  Increase Metformin XR to 1000 MG QDAY -- script sent, he denies GI issues.  Due to history of hypoglycemia, which he has had in past, will avoid restart of Glipizide at this time and if elevation consider SGLT or GLP add on. Continue Gabapentin for discomfort. Recommend he focus on diet, as still tends to snack a lot at home, and avoid alcohol use. Continue checking BS daily at home.  Return in 3 months. ?

## 2021-09-10 NOTE — Assessment & Plan Note (Signed)
Chronic, ongoing.  A1C 7.6% today.  Increase Metformin XR back to 1000 MG QDAY -- script sent, he denies GI issues.  Due to history of hypoglycemia, which he has had in past, will avoid restart of Glipizide at this time and if elevation in future consider SGLT2 or GLP1 add on. Continue Gabapentin for discomfort. Recommend he focus on diet, as still tends to snack a lot at home, and avoid alcohol use. Continue checking BS daily at home.  Return in 3 months. ?

## 2021-09-11 ENCOUNTER — Telehealth: Payer: Self-pay | Admitting: Urology

## 2021-09-11 LAB — COMPREHENSIVE METABOLIC PANEL
ALT: 15 IU/L (ref 0–44)
AST: 17 IU/L (ref 0–40)
Albumin/Globulin Ratio: 1.7 (ref 1.2–2.2)
Albumin: 4.4 g/dL (ref 4.0–5.0)
Alkaline Phosphatase: 93 IU/L (ref 44–121)
BUN/Creatinine Ratio: 12 (ref 9–20)
BUN: 14 mg/dL (ref 6–24)
Bilirubin Total: 0.5 mg/dL (ref 0.0–1.2)
CO2: 22 mmol/L (ref 20–29)
Calcium: 9.4 mg/dL (ref 8.7–10.2)
Chloride: 101 mmol/L (ref 96–106)
Creatinine, Ser: 1.15 mg/dL (ref 0.76–1.27)
Globulin, Total: 2.6 g/dL (ref 1.5–4.5)
Glucose: 178 mg/dL — ABNORMAL HIGH (ref 70–99)
Potassium: 3.9 mmol/L (ref 3.5–5.2)
Sodium: 139 mmol/L (ref 134–144)
Total Protein: 7 g/dL (ref 6.0–8.5)
eGFR: 78 mL/min/{1.73_m2} (ref >59)

## 2021-09-11 LAB — CBC WITH DIFFERENTIAL/PLATELET
Basophils Absolute: 0 10*3/uL (ref 0.0–0.2)
Basos: 1 %
EOS (ABSOLUTE): 0.4 10*3/uL (ref 0.0–0.4)
Eos: 11 %
Hematocrit: 40.9 % (ref 37.5–51.0)
Hemoglobin: 13.9 g/dL (ref 13.0–17.7)
Immature Grans (Abs): 0 10*3/uL (ref 0.0–0.1)
Immature Granulocytes: 0 %
Lymphocytes Absolute: 1.2 10*3/uL (ref 0.7–3.1)
Lymphs: 32 %
MCH: 29.8 pg (ref 26.6–33.0)
MCHC: 34 g/dL (ref 31.5–35.7)
MCV: 88 fL (ref 79–97)
Monocytes Absolute: 0.4 10*3/uL (ref 0.1–0.9)
Monocytes: 11 %
Neutrophils Absolute: 1.7 10*3/uL (ref 1.4–7.0)
Neutrophils: 45 %
Platelets: 228 10*3/uL (ref 150–450)
RBC: 4.66 x10E6/uL (ref 4.14–5.80)
RDW: 12.9 % (ref 11.6–15.4)
WBC: 3.6 10*3/uL (ref 3.4–10.8)

## 2021-09-11 LAB — LIPID PANEL W/O CHOL/HDL RATIO
Cholesterol, Total: 142 mg/dL (ref 100–199)
HDL: 48 mg/dL (ref >39)
LDL Chol Calc (NIH): 78 mg/dL (ref 0–99)
Triglycerides: 81 mg/dL (ref 0–149)
VLDL Cholesterol Cal: 16 mg/dL (ref 5–40)

## 2021-09-11 LAB — HEPATITIS C ANTIBODY: Hep C Virus Ab: NONREACTIVE

## 2021-09-11 LAB — TSH: TSH: 0.981 u[IU]/mL (ref 0.450–4.500)

## 2021-09-11 MED ORDER — GEMTESA 75 MG PO TABS: 75.0000 mg | ORAL_TABLET | Freq: Every day | ORAL | 3 refills | Status: DC

## 2021-09-11 NOTE — Progress Notes (Signed)
Contacted via MyChart ? ? ?Good evening [name], your labs have returned.  Overall all is stable, continue all current medications as ordered.  Any questions? ?Keep being stellar!!  Thank you for allowing me to participate in your care.  I appreciate you. ?Kindest regards, ?Hulen Mandler ?

## 2021-09-11 NOTE — Telephone Encounter (Signed)
Sent medication to pharmacy.   

## 2021-09-11 NOTE — Telephone Encounter (Signed)
Gemtesa (samples) are working and needs a refill. ? ?[facility] in [place] ? ?Please call patient when the prescription has been sent in. ?

## 2021-09-17 ENCOUNTER — Ambulatory Visit: Payer: Medicare Other

## 2021-09-24 ENCOUNTER — Ambulatory Visit: Payer: Medicare Other

## 2021-10-01 ENCOUNTER — Ambulatory Visit: Payer: Medicare Other

## 2021-10-01 ENCOUNTER — Other Ambulatory Visit: Payer: Self-pay | Admitting: Nurse Practitioner

## 2021-10-03 NOTE — Telephone Encounter (Signed)
Requested Prescriptions  ?Pending Prescriptions Disp Refills  ?? amLODipine (NORVASC) 10 MG tablet [Pharmacy Med Name: AMLODIPINE BESYLATE 10 MG TAB] 90 tablet 0  ?  Sig: TAKE 1 TABLET BY MOUTH ONCE DAILY  ?  ? Cardiovascular: Calcium Channel Blockers 2 Passed - 10/01/2021  3:14 PM  ?  ?  Passed - Last BP in normal range  ?  BP Readings from Last 1 Encounters:  ?09/10/21 112/77  ?   ?  ?  Passed - Last Heart Rate in normal range  ?  Pulse Readings from Last 1 Encounters:  ?09/10/21 75  ?   ?  ?  Passed - Valid encounter within last 6 months  ?  Recent Outpatient Visits   ?      ? 3 weeks ago Type 2 diabetes mellitus with proteinuria ([place])  ? Pierpont, [place] T, NP  ? 5 months ago Great toe pain, left  ? [place], NP  ? 7 months ago Type 2 diabetes mellitus with diabetic polyneuropathy, without long-term current use of insulin ([place])  ? [place], Chebanse T, NP  ? 9 months ago Type 2 diabetes mellitus with proteinuria ([place])  ? Vernon, Philo T, NP  ? 10 months ago Type 2 diabetes mellitus with proteinuria ([place])  ? [facility] [place], Henrine Screws T, NP  ?  ?  ?Future Appointments   ?        ? In 6 days Dunn, Areta Haber, PA-C [facility] [place], [facility]  ? In 2 months Cannady, Barbaraann Faster, NP [employer], [facility]  ?  ? ?  ?  ?  ?? atorvastatin (LIPITOR) 80 MG tablet [Pharmacy Med Name: ATORVASTATIN CALCIUM 80 MG TAB] 90 tablet 0  ?  Sig: TAKE 1 TABLET BY MOUTH ONCE DAILY AT 6PM  ?  ? Cardiovascular:  Antilipid - Statins Failed - 10/01/2021  3:14 PM  ?  ?  Failed - Lipid Panel in normal range within the last 12 months  ?  Cholesterol, Total  ?Date Value Ref Range Status  ?09/10/2021 142 100 - 199 mg/dL Final  ? ?Cholesterol  ?Date Value Ref Range Status  ?12/15/2013 205 (H) 0 - 200 mg/dL Final  ? ?Cholesterol Piccolo, [place]  ?Date Value Ref Range Status  ?12/02/2018 123 <200 mg/dL Final  ?   Comment:  ?                          Desirable                <200 ?                        Borderline High      200- 239 ?                        High                     >239 ?  ? ?Ldl Cholesterol, Calc  ?Date Value Ref Range Status  ?12/15/2013 125 (H) 0 - 100 mg/dL Final  ? ?LDL Chol Calc (NIH)  ?Date Value Ref Range Status  ?09/10/2021 78 0 - 99 mg/dL Final  ? ?HDL Cholesterol  ?Date Value Ref Range Status  ?12/15/2013 49 40 - 60 mg/dL Final  ? ?HDL  ?  Date Value Ref Range Status  ?09/10/2021 48 >39 mg/dL Final  ? ?Triglycerides  ?Date Value Ref Range Status  ?09/10/2021 81 0 - 149 mg/dL Final  ?12/15/2013 154 0 - 200 mg/dL Final  ? ?Triglycerides Piccolo,Waived  ?Date Value Ref Range Status  ?12/02/2018 73 <150 mg/dL Final  ?  Comment:  ?                          Normal                   <150 ?                        Borderline High     150 - 199 ?                        High                200 - 499 ?                        Very High                >499 ?  ? ?  ?  ?  Passed - Patient is not pregnant  ?  ?  Passed - Valid encounter within last 12 months  ?  Recent Outpatient Visits   ?      ? 3 weeks ago Type 2 diabetes mellitus with proteinuria ([place])  ? Brewer, Soledad T, NP  ? 5 months ago Great toe pain, left  ? [place], NP  ? 7 months ago Type 2 diabetes mellitus with diabetic polyneuropathy, without long-term current use of insulin ([place])  ? Mather, Montpelier T, NP  ? 9 months ago Type 2 diabetes mellitus with proteinuria ([place])  ? [place]ing[place] Grove, Bullhead T, NP  ? 10 months ago Type 2 diabetes mellitus with proteinuria ([place])  ? [facility] [place], Henrine Screws T, NP  ?  ?  ?Future Appointments   ?        ? In 6 days Dunn, Areta Haber, PA-C [facility], [facility]  ? In 2 months Cannady, Barbaraann Faster, NP [employer], [facility]  ?  ? ?  ?  ?  ?? carvedilol (COREG) 6.25 MG tablet [Pharmacy  Med Name: CARVEDILOL 6.25 MG TAB] 180 tablet 0  ?  Sig: TAKE 1 TABLET BY MOUTH TWICE DAILY WITH A MEAL  ?  ? Cardiovascular: Beta Blockers 3 Passed - 10/01/2021  3:14 PM  ?  ?  Passed - Cr in normal range and within 360 days  ?  Creatinine  ?Date Value Ref Range Status  ?07/07/2014 1.19 0.60 - 1.30 mg/dL Final  ? ?Creatinine, Ser  ?Date Value Ref Range Status  ?09/10/2021 1.15 0.76 - 1.27 mg/dL Final  ?   ?  ?  Passed - AST in normal range and within 360 days  ?  AST  ?Date Value Ref Range Status  ?09/10/2021 17 0 - 40 IU/L Final  ? ?SGOT(AST)  ?Date Value Ref Range Status  ?07/07/2014 10 (L) 15 - 37 Unit/L Final  ?   ?  ?  Passed - ALT in normal range and within 360 days  ?  ALT  ?Date Value Ref Range  Status  ?09/10/2021 15 0 - 44 IU/L Final  ? ?SGPT (ALT)  ?Date Value Ref Range Status  ?07/07/2014 15 U/L Final  ?  Comment:  ?  14-63 ?NOTE: New Reference Range ?01/25/14 ?  ?   ?  ?  Passed - Last BP in normal range  ?  BP Readings from Last 1 Encounters:  ?09/10/21 112/77  ?   ?  ?  Passed - Last Heart Rate in normal range  ?  Pulse Readings from Last 1 Encounters:  ?09/10/21 75  ?   ?  ?  Passed - Valid encounter within last 6 months  ?  Recent Outpatient Visits   ?      ? 3 weeks ago Type 2 diabetes mellitus with proteinuria ([place])  ? [place], Holbrook T, NP  ? 5 months ago Great toe pain, left  ? [place], NP  ? 7 months ago Type 2 diabetes mellitus with diabetic polyneuropathy, without long-term current use of insulin ([place])  ? [place], Kent T, NP  ? 9 months ago Type 2 diabetes mellitus with proteinuria ([place])  ? [place], [place]ileys Harbor T, NP  ? 10 months ago Type 2 diabetes mellitus with proteinuria ([place])  ? [facility] [place], Henrine Screws T, NP  ?  ?  ?Future Appointments   ?        ? In 6 days Dunn, Areta Haber, PA-C [facility], [facility]  ? In 2 months Cannady, Barbaraann Faster, NP [employer], [facility]  ?  ? ?  ?  ?  ?? citalopram (CELEXA) 10 MG tablet [Pharmacy Med Name: CITALOPRAM HYDROBROMIDE 10 MG TAB] 90 tablet 0  ?  Sig: TAKE 1 TABLET BY MOUTH ONCE DAILY  ?  ? Psychiatry:  Antidepressants - SSRI Passed - 10/01/2021  3:14 PM  ?  ?  Passed - Valid encounter within last 6 months  ?  Recent Outpatient Visits   ?      ? 3 weeks ago Type 2 diabetes mellitus with proteinuria ([place])  ? Ossian, Noxon T, NP  ? 5 months ago Great toe pain, left  ? [place], NP  ? 7 months ago Type 2 diabetes mellitus with diabetic polyneuropathy, without long-term current use of insulin ([place])  ? [place], [place]olin[place] T, NP  ? 9 months ago Type 2 diabetes mellitus with proteinuria ([place])  ? [place], [place] T, NP  ? 10 months ago Type 2 diabetes mellitus with proteinuria ([place])  ? [facility] [place], Henrine Screws T, NP  ?  ?  ?Future Appointments   ?        ? In 6 days Dunn, Areta Haber, PA-C [facility], [facility]  ? In 2 months Cannady, Barbaraann Faster, NP [employer], [facility]  ?  ? ?  ?  ?  ?? lisinopril (ZESTRIL) 5 MG tablet [Pharmacy Med Name: LISINOPRIL 5 MG TAB] 90 tablet 0  ?  Sig: TAKE 1 TABLET BY MOUTH ONCE DAILY  ?  ? Cardiovascular:  ACE Inhibitors Passed - 10/01/2021  3:14 PM  ?  ?  Passed - Cr in normal range and within 180 days  ?  Creatinine  ?Date Value Ref Range Status  ?07/07/2014 1.19 0.60 - 1.30 mg/dL Final  ? ?Creatinine, Ser  ?Date Value Ref Range Status  ?09/10/2021  1.15 0.76 - 1.27 mg/dL Final  ?   ?  ?  Passed - K in normal range and within 180 days  ?  Potassium  ?Date Value Ref Range Status  ?09/10/2021 3.9 3.5 - 5.2 mmol/L Final  ?07/07/2014 3.7 3.5 - 5.1 mmol/L Final  ?   ?  ?  Passed - Patient is not pregnant  ?  ?  Passed - Last BP in normal range  ?  BP Readings from Last 1 Encounters:  ?09/10/21 112/77  ?   ?  ?  Passed - Valid encounter within last 6 months  ?  Recent  Outpatient Visits   ?      ? 3 weeks ago Type 2 diabetes mellitus with proteinuria ([place])  ? [place], Bayshore T, NP  ? 5 months ago Great toe pain, left  ? [place]

## 2021-10-08 ENCOUNTER — Ambulatory Visit: Payer: Medicare Other

## 2021-10-08 NOTE — Progress Notes (Deleted)
? ?Cardiology Office Note   ? ?Date:  10/08/2021  ? ?ID:  [name], DOB 05-21-1971, MRN [id number] ? ?PCP:  Patrick Lick, NP  ?Cardiologist:  Patrick Sable, MD  ?Electrophysiologist:  None  ? ?Chief Complaint: Preoperative cardiac risk stratification ? ?History of Present Illness:  ? ?[name] is a 51 y.o. male with history of history of cardiomyopathy with subsequent improvement in LVEF, CVA involving the left MCA territory in 12/2013 with residual right-sided weakness, chronic communicating hydrocephalus, HTN, HLD, DM2 with diabetic polyneuropathy, memory loss, impaired gait with falls, and obesity who presents for preoperative cardiac restratification for planned PTNS. ? ?He was admitted with an acute left MCA territory CVA in 12/2013.  2D echo demonstrated an EF of 40 to 45%.  Carotid artery ultrasound showed no significant carotid artery stenosis bilaterally at that time.  Cardiology consult note indicates smoke seen on TEE.  "Despite his CVA and smoke on TEE, in the absence of clearly documented A-fib, would not recommend systemic anticoagulation."  Note also indicates LVEF was normal by TEE, as read by outside cardiology group (report unavailable for review).  He underwent recorder implantation with no evidence of A-fib noted on available documented device interrogations. ? ?He was previously followed by Dr. ***, and is established with our office in 08/2020 with echo in 09/2020 demonstrating an EF of 50 to 55%, no regional wall motion abnormalities, grade 2 diastolic dysfunction, normal RV systolic function and ventricular cavity size, mildly dilated left ventricle, and no significant valvular abnormalities. ? ?He is planning to undergo PTNS for urinary incontinence. ? ?*** ? ?Revised Cardiac Risk Index: He is *** risk for noncardiac surgery ? ?[facility] Activity Status Index: *** METs ? ?Labs independently reviewed:  ?09/2021 -TC 142, TG 81, HDL 48, LDL 78, TSH normal, BUN 14, serum creatinine 1.15,  potassium 3.9, albumin 4.4, AST/ALT normal, Hgb 13.9, PLT 228, A1c 7.6 ? ?Past Medical History:  ?Diagnosis Date  ? Depression   ? Diabetes mellitus without complication ([place])   ? Diastolic dysfunction   ? a. 09/2020 Echo: EF 50-55%, no rwma, GrII DD. Nl RV size/fxn.  Mildly dil LA.  ? Hydrocephalus in adult [facility])   ? Hyperlipidemia   ? Hypertension   ? Seizures ([place])   ? Stroke [facility])   ? ? ?Past Surgical History:  ?Procedure Laterality Date  ? COLONOSCOPY WITH PROPOFOL N/A 03/08/2020  ? Procedure: COLONOSCOPY WITH PROPOFOL;  Surgeon: Virgel Manifold, MD;  Location: [facility] ENDOSCOPY;  Service: Endoscopy;  Laterality: N/A;  ? LOOP RECORDER IMPLANT  12/20/13  ? MDT LinQ implanted by Dr Lovena Le for cryptogenic stroke  ? LOOP RECORDER IMPLANT N/A 12/20/2013  ? Procedure: LOOP RECORDER IMPLANT;  Surgeon: Evans Lance, MD;  [facility] C[facility] LAB;  Service: Cardiovascular;  Laterality: N/A;  ? ? ?Current Medications: ?No outpatient medications have been marked as taking for the 10/09/21 encounter (Appointment) with Rise Mu, PA-C.  ? ? ?Allergies:   Patient has no known allergies.  ? ?Social History  ? ?Socioeconomic History  ? Marital status: Single  ?  Spouse name: Not on file  ? Number of children: Not on file  ? Years of education: Not on file  ? Highest education level: Not on file  ?Occupational History  ? Not on file  ?Tobacco Use  ? Smoking status: Former  ? Smokeless tobacco: Never  ? Tobacco comments:  ?  > 4 years quit  ?Vaping Use  ? Vaping  Use: Never used  ?Substance and Sexual Activity  ? Alcohol use: Yes  ?  Alcohol/week: 9.0 standard drinks  ?  Types: 7 Cans of beer, 2 Standard drinks or equivalent per week  ?  Comment: in a week  ? Drug use: No  ? Sexual activity: Not Currently  ?Other Topics Concern  ? Not on file  ?Social History Narrative  ? Not on file  ? ?Social Determinants of Health  ? ?Financial Resource Strain: Low Risk   ? Difficulty of Paying Living Expenses: Not hard at all  ?Food Insecurity:  No Food Insecurity  ? Worried About Programme researcher, broadcasting/film/video in the Last Year: Never true  ? Ran Out of Food in the Last Year: Never true  ?Transportation Needs: No Transportation Needs  ? Lack of Transportation (Medical): No  ? Lack of Transportation (Non-Medical): No  ?Physical Activity: Inactive  ? Days of Exercise per Week: 0 days  ? Minutes of Exercise per Session: 0 min  ?Stress: No Stress Concern Present  ? Feeling of Stress : Not at all  ?Social Connections: Socially Isolated  ? Frequency of Communication with Friends and Family: Three times a week  ? Frequency of Social Gatherings with Friends and Family: Three times a week  ? Attends Religious Services: Never  ? Active Member of Clubs or Organizations: No  ? Attends Banker Meetings: Never  ? Marital Status: Never married  ?  ? ?Family History:  ?The patient's family history includes Arthritis in his brother; Diabetes in his maternal grandfather and mother; Heart disease in his father; Hypertension in his father and mother; Stroke in his father. ? ?ROS:   ?ROS ? ? ?EKGs/Labs/Other Studies Reviewed:   ? ?Studies reviewed were summarized above. The additional studies were reviewed today: ? ?2D echo 09/05/2020: ?1. Left ventricular ejection fraction, by estimation, is 50 to 55%. The  ?left ventricle has low normal function. The left ventricle has no regional  ?wall motion abnormalities. Left ventricular diastolic parameters are  ?consistent with Grade II diastolic  ?dysfunction (pseudonormalization).  ? 2. Right ventricular systolic function is normal. The right ventricular  ?size is normal.  ? 3. Left atrial size was mildly dilated. ? ? ?EKG:  EKG is ordered today.  The EKG ordered today demonstrates *** ? ?Recent Labs: ?09/10/2021: ALT 15; BUN 14; Creatinine, Ser 1.15; Hemoglobin 13.9; Platelets 228; Potassium 3.9; Sodium 139; TSH 0.981  ?Recent Lipid Panel ?   ?Component Value Date/Time  ? CHOL 142 09/10/2021 1426  ? CHOL 123 12/02/2018 1323  ? CHOL  205 (H) 12/15/2013 0408  ? TRIG 81 09/10/2021 1426  ? TRIG 73 12/02/2018 1323  ? TRIG 154 12/15/2013 0408  ? HDL 48 09/10/2021 1426  ? HDL 49 12/15/2013 0408  ? CHOLHDL 2.5 10/11/2015 1027  ? VLDL 15 12/02/2018 1323  ? VLDL 31 12/15/2013 0408  ? LDLCALC 78 09/10/2021 1426  ? LDLCALC 125 (H) 12/15/2013 0408  ? ? ?PHYSICAL EXAM:   ? ?VS:  There were no vitals taken for this visit.  BMI: There is no height or weight on file to calculate BMI. ? ?Physical Exam ? ?Wt Readings from Last 3 Encounters:  ?09/10/21 275 lb (124.7 kg)  ?08/06/21 275 lb (124.7 kg)  ?04/24/21 272 lb (123.4 kg)  ?  ? ?ASSESSMENT & PLAN:  ? ?Preoperative cardiac risk stratification: *** ? ?History of cardiomyopathy: Note at time of CVA in 12/2013 indicates 2D surface echo demonstrated a mild cardiomyopathy  with an EF of 40 to 45%.  This note also indicates TEE demonstrated normal LVEF (report not available for review).  Echo upon establishing with our group in 09/2020 demonstrated a low normal EF of 50 to 55% with no regional wall motion abnormalities. ? ?History of left MCA CVA/hydrocephalus: ***.  Available data from Loop recorder has not shown evidence of A-fib.  It was recommended to defer initiation of [place] at time of CVA, despite smoke noted on TEE, without clear evidence of A-fib.  Followed by neurology. ? ?HTN: Blood pressure *** ? ?HLD: LDL 78. ? ?DM2 with diabetic polyneuropathy: A1c 7.6. ? ? ?{Are you ordering a CV Procedure (e.g. stress test, cath, DCCV, TEE, etc)?   Press F2        :[id number]  ? ? ? ?Disposition: F/u with Dr. Garen Lah or an APP in ***. ? ? ?Medication Adjustments/Labs and Tests Ordered: ?Current medicines are reviewed at length with the patient today.  Concerns regarding medicines are outlined above. Medication changes, Labs and Tests ordered today are summarized above and listed in the Patient Instructions accessible in Encounters.  ? ?Signed, ?Christell Faith, PA-C ?10/08/2021 7:49 AM    ? ?[place] [address], [place] [id number] ?[phone] ?

## 2021-10-09 ENCOUNTER — Ambulatory Visit: Payer: Medicare Other | Admitting: Physician Assistant

## 2021-10-09 DIAGNOSIS — G919 Hydrocephalus, unspecified: Secondary | ICD-10-CM

## 2021-10-09 DIAGNOSIS — E785 Hyperlipidemia, unspecified: Secondary | ICD-10-CM

## 2021-10-09 DIAGNOSIS — E118 Type 2 diabetes mellitus with unspecified complications: Secondary | ICD-10-CM

## 2021-10-09 DIAGNOSIS — I639 Cerebral infarction, unspecified: Secondary | ICD-10-CM

## 2021-10-09 DIAGNOSIS — Z01810 Encounter for preprocedural cardiovascular examination: Secondary | ICD-10-CM

## 2021-10-09 DIAGNOSIS — I10 Essential (primary) hypertension: Secondary | ICD-10-CM

## 2021-10-09 DIAGNOSIS — Z8679 Personal history of other diseases of the circulatory system: Secondary | ICD-10-CM

## 2021-10-10 ENCOUNTER — Telehealth: Payer: Medicare Other

## 2021-10-10 ENCOUNTER — Encounter: Payer: Self-pay | Admitting: Physician Assistant

## 2021-10-10 ENCOUNTER — Other Ambulatory Visit: Payer: Self-pay | Admitting: Nurse Practitioner

## 2021-10-10 ENCOUNTER — Ambulatory Visit (INDEPENDENT_AMBULATORY_CARE_PROVIDER_SITE_OTHER): Payer: Medicare Other

## 2021-10-10 DIAGNOSIS — E1129 Type 2 diabetes mellitus with other diabetic kidney complication: Secondary | ICD-10-CM

## 2021-10-10 DIAGNOSIS — E1169 Type 2 diabetes mellitus with other specified complication: Secondary | ICD-10-CM

## 2021-10-10 DIAGNOSIS — G918 Other hydrocephalus: Secondary | ICD-10-CM

## 2021-10-10 DIAGNOSIS — E1159 Type 2 diabetes mellitus with other circulatory complications: Secondary | ICD-10-CM

## 2021-10-10 DIAGNOSIS — N3941 Urge incontinence: Secondary | ICD-10-CM

## 2021-10-10 DIAGNOSIS — Z8673 Personal history of transient ischemic attack (TIA), and cerebral infarction without residual deficits: Secondary | ICD-10-CM

## 2021-10-10 DIAGNOSIS — G910 Communicating hydrocephalus: Secondary | ICD-10-CM

## 2021-10-10 MED ORDER — ACCU-CHEK SOFTCLIX LANCETS MISC: 12 refills | Status: DC

## 2021-10-10 MED ORDER — CONTOUR NEXT TEST VI STRP: ORAL_STRIP | 12 refills | Status: DC

## 2021-10-10 MED ORDER — CONTOUR NEXT MONITOR W/DEVICE KIT: PACK | 0 refills | Status: DC

## 2021-10-10 NOTE — Chronic Care Management (AMB) (Signed)
?Chronic Care Management  ? ?CCM RN Visit Note ? ?10/10/2021 ?Name: [name] MRN: [id number] DOB: 1971-01-09 ? ?Subjective: ?[name] is a 51 y.o. year old male who is a primary care patient of Cannady, Barbaraann Faster, NP. The care management team was consulted for assistance with disease management and care coordination needs.   ? ?Engaged with patient by telephone for follow up visit in response to provider referral for case management and/or care coordination services.  ? ?Consent to Services:  ?The patient was given information about Chronic Care Management services, agreed to services, and gave verbal consent prior to initiation of services.  Please see initial visit note for detailed documentation.  ? ?Patient agreed to services and verbal consent obtained.  ? ?Assessment: Review of patient past medical history, allergies, medications, health status, including review of consultants reports, laboratory and other test data, was performed as part of comprehensive evaluation and provision of chronic care management services.  ? ?SDOH (Social Determinants of Health) assessments and interventions performed:   ? ?CCM Care Plan ? ?No Known Allergies ? ?Outpatient Encounter Medications as of 10/10/2021  ?Medication Sig  ? acetaminophen (TYLENOL) 325 MG tablet Take 2 tablets (650 mg total) by mouth every 6 (six) hours as needed for mild pain.  ? amLODipine (NORVASC) 10 MG tablet TAKE 1 TABLET BY MOUTH ONCE DAILY  ? ASPIR-LOW 81 MG EC tablet TAKE 1 TABLET BY MOUTH ONCE DAILY  ? atorvastatin (LIPITOR) 80 MG tablet TAKE 1 TABLET BY MOUTH ONCE DAILY AT 6PM  ? Blood Glucose Monitoring Suppl ([place]) w/Device KIT Use to check blood sugar at least 2-3 times a day, in morning fasting and then 2 hours after a meal.  ? Blood Pressure Monitoring (BLOOD PRESSURE MONITOR AUTOMAT) DEVI Use to monitor blood pressure once daily.  ? carvedilol (COREG) 6.25 MG tablet TAKE 1 TABLET BY MOUTH TWICE DAILY WITH A MEAL  ?  citalopram (CELEXA) 10 MG tablet TAKE 1 TABLET BY MOUTH ONCE DAILY  ? clopidogrel (PLAVIX) 75 MG tablet TAKE 1 TABLET BY MOUTH ONCE EVERY MORNING WITH BREAKFAST  ? donepezil (ARICEPT) 5 MG tablet Take 1 tablet (5 mg total) by mouth daily.  ? gabapentin (NEURONTIN) 300 MG capsule TAKE 1 CAPSULE BY MOUTH 3 TIMES DAILY  ? glucose blood (ONETOUCH VERIO) test strip Use to check blood sugar at least 2-3 times a day, in morning fasting and 2 hours after a meal.  ? Lancet Devices (ACCU-CHEK SOFTCLIX) lancets Use as instructed  ? lisinopril (ZESTRIL) 5 MG tablet TAKE 1 TABLET BY MOUTH ONCE DAILY  ? metFORMIN (GLUCOPHAGE-XR) 500 MG 24 hr tablet Take 2 tablets (1,000 mg total) by mouth daily with breakfast.  ? Multiple Vitamin (MULTIVITAMIN) tablet Take 1 tablet by mouth daily.  ? mupirocin cream (BACTROBAN) 2 % Apply 1 application topically 2 (two) times daily.  ? tamsulosin (FLOMAX) 0.4 MG CAPS capsule Take 0.4 mg by mouth daily.  ? Vibegron (GEMTESA) 75 MG TABS Take 75 mg by mouth daily.  ? vitamin B-12 (CYANOCOBALAMIN) 1000 MCG tablet Take 1 tablet (1,000 mcg total) by mouth daily.  ? ?No facility-administered encounter medications on file as of 10/10/2021.  ? ? ?Patient Active Problem List  ? Diagnosis Date Noted  ? Poor mobility 09/06/2020  ? Type 2 diabetes mellitus with proteinuria ([place]) 12/31/2019  ? Incontinence of feces 12/10/2019  ? Urge incontinence of urine 12/10/2019  ? Chronic communicating hydrocephalus ([place]) 04/22/2019  ? Vitamin B12 deficiency  12/02/2018  ? Chronic pain of right ankle 12/18/2015  ? Obesity 10/16/2015  ? Hyperlipidemia associated with type 2 diabetes mellitus ([place]) 09/13/2015  ? History of stroke 12/17/2013  ? Hypertension associated with diabetes ([place]) 12/17/2013  ? Cardiomyopathy ([place]) 12/17/2013  ? Type 2 diabetes mellitus with diabetic polyneuropathy ([place]) 12/17/2013  ? ? ?Conditions to be addressed/monitored:HTN, HLD, DMII, and Urinary Incontinence and history of stroke with brain  syndrome ? ?Care Plan : RNCM: General Plan of Care (Adult) for Chronic Disease Management and Care Coordination Needs  ?Updates made by Vanita Ingles, RN since 10/10/2021 12:00 AM  ?  ? ?Problem: RNCM: Development of Plan of care for Chronic Disease Management (HTN, HLD, DM, Urinary Incontinence, history of CVA with brain syndrome)   ?Priority: High  ?  ? ?Long-Range Goal: RNCM: Effective Management of Plan of care for Chronic Disease Management (HTN, HLD, DM, Urinary Incontinence, history of CVA with brain syndrome)   ?Start Date: 08/15/2021  ?Expected End Date: 08/15/2022  ?Priority: High  ?Note:   ?Current Barriers:  ?Knowledge Deficits related to plan of care for management of HTN, HLD, DMII, and Urinary incontinence, and history of CVA with brain hydrocephalus syndrome   ?Care Coordination needs related to Level of care concerns and Mental Health Concerns   ?Chronic Disease Management support and education needs related to HTN, HLD, DMII, and urinary incontinence, history of CVA with brain hydrocephalus syndrome ?Lacks caregiver support.        ?Cognitive Deficits ? ?RNCM Clinical Goal(s):  ?Patient will verbalize understanding of plan for management of HTN, HLD, DMII, and Urinary Incontinence and history of brain hydrocephalus syndrome as evidenced by keeping appointments, stable conditions, calling the office for questions or concerns and working with the CCM team to optimize health and well being  ?take all medications exactly as prescribed and will call provider for medication related questions as evidenced by compliance with medications and calling for refills before running out of medications    ?attend all scheduled medical appointments: keep appointments with pcp  as evidenced by compliance with routine office visits and calling  the office for schedule change needs         ?demonstrate improved and ongoing adherence to prescribed treatment plan for HTN, HLD, DMII, and urinary incontinence, and  history of  stroke with brain hydrocephalus  as evidenced by VS stable, lab work stable, no exacerbations of chronic conditions, and patient able to maintain health and well being ?continue to work with Consulting civil engineer and/or Social Worker to address care management and care coordination needs related to HTN, HLD, DMII, and urinary incontinence and history of stroke with brain syndrome as evidenced by adherence to CM Team Scheduled appointments     ?demonstrate ongoing self health care management ability effective management of chronic conditions as evidenced by working with the CCM team  through collaboration with Consulting civil engineer, provider, and care team.  ? ?Interventions: ?1:1 collaboration with primary care provider regarding development and update of comprehensive plan of care as evidenced by provider attestation and co-signature ?Inter-disciplinary care team collaboration (see longitudinal plan of care) ?Evaluation of current treatment plan related to  self management and patient's adherence to plan as established by provider ? ? ?Diabetes:  (Status: Goal on Track (progressing): YES.) Long Term Goal  ? ?Lab Results  ?Component Value Date  ? HGBA1C 7.6 (H) 09/10/2021  ?  ?Assessed patient's understanding of A1c goal: <7%. 10-10-2021: The patient with elevated A1C from 5.5  yesterday. Education given to the patients brother.  ?Provided education to patient about basic DM disease process; ?Reviewed medications with patient and discussed importance of medication adherence. 10-10-2021; States compliance with medications. Has increased Metformin without any difficulty        ?Reviewed prescribed diet with patient heart healthy/ADA diet. 10-10-2021: The brother denies any issues with appetite or following of dietary restrictions ; ?Counseled on importance of regular laboratory monitoring as prescribed. 10-10-2021: Has regular lab testing;        ?Discussed plans with patient for ongoing care management follow up and provided patient with  direct contact information for care management team;      ?Provided patient with written educational materials related to hypo and hyperglycemia and importance of correct treatment. 10-10-2021: Review of hypo and

## 2021-10-10 NOTE — Patient Instructions (Signed)
Visit Information ? ?Thank you for taking time to visit with me today. Please don't hesitate to contact me if I can be of assistance to you before our next scheduled telephone appointment. ? ?Following are the goals we discussed today:  ?RNCM Clinical Goal(s):  ?Patient will verbalize understanding of plan for management of HTN, HLD, DMII, and Urinary Incontinence and history of brain hydrocephalus syndrome as evidenced by keeping appointments, stable conditions, calling the office for questions or concerns and working with the CCM team to optimize health and well being  ?take all medications exactly as prescribed and will call provider for medication related questions as evidenced by compliance with medications and calling for refills before running out of medications    ?attend all scheduled medical appointments: keep appointments with pcp  as evidenced by compliance with routine office visits and calling  the office for schedule change needs         ?demonstrate improved and ongoing adherence to prescribed treatment plan for HTN, HLD, DMII, and urinary incontinence, and  history of stroke with brain hydrocephalus  as evidenced by VS stable, lab work stable, no exacerbations of chronic conditions, and patient able to maintain health and well being ?continue to work with Consulting civil engineer and/or Social Worker to address care management and care coordination needs related to HTN, HLD, DMII, and urinary incontinence and history of stroke with brain syndrome as evidenced by adherence to CM Team Scheduled appointments     ?demonstrate ongoing self health care management ability effective management of chronic conditions as evidenced by working with the CCM team  through collaboration with Consulting civil engineer, provider, and care team.  ?  ?Interventions: ?1:1 collaboration with primary care provider regarding development and update of comprehensive plan of care as evidenced by provider attestation and  co-signature ?Inter-disciplinary care team collaboration (see longitudinal plan of care) ?Evaluation of current treatment plan related to  self management and patient's adherence to plan as established by provider ?  ?  ?Diabetes:  (Status: Goal on Track (progressing): YES.) Long Term Goal  ?  ?     ?Lab Results  ?Component Value Date  ?  HGBA1C 7.6 (H) 09/10/2021  ?  ?Assessed patient's understanding of A1c goal: <7%. 10-10-2021: The patient with elevated A1C from 5.5 yesterday. Education given to the patients brother.  ?Provided education to patient about basic DM disease process; ?Reviewed medications with patient and discussed importance of medication adherence. 10-10-2021; States compliance with medications. Has increased Metformin without any difficulty        ?Reviewed prescribed diet with patient heart healthy/ADA diet. 10-10-2021: The brother denies any issues with appetite or following of dietary restrictions ; ?Counseled on importance of regular laboratory monitoring as prescribed. 10-10-2021: Has regular lab testing;        ?Discussed plans with patient for ongoing care management follow up and provided patient with direct contact information for care management team;      ?Provided patient with written educational materials related to hypo and hyperglycemia and importance of correct treatment. 10-10-2021: Review of hypo and hyperglycemia. The patients brother assist with his care and denies any issues with his DM management ;       ?Advised patient, providing education and rationale, to check cbg twice daily, when you have symptoms of low or high blood sugar, and before and after exercise and record. 08-15-2021: Per the patients brother his blood sugars are ranging from 80-150's. Denies any issues with DM management. 10-10-2021: The  patients brother states he has not been taking because he ran out of strips for the meter and he will have to pay for additional strips. Will ask the pharm D to reach out to the patient  and assist with this expressed need.    ?call provider for findings outside established parameters;       ?Review of patient status, including review of consultants reports, relevant laboratory and other test results, and medications completed;       ?Screening for signs and symptoms of depression related to chronic disease state;        ?Assessed social determinant of health barriers;  ?Last vision test in December 2022 per the patients brother      ?Will have a diabetic foot exam on 10-25-2021  ?  ?Urinary Incontinence   (Status: Goal on Track (progressing): YES.) Long Term Goal  ?Evaluation of current treatment plan related to  Urinary incontinence , Limited social support, Level of care concerns, Mental Health Concerns , and Memory Deficits self-management and patient's adherence to plan as established by provider. ?Discussed plans with patient for ongoing care management follow up and provided patient with direct contact information for care management team ?Advised patient to call the specialist for changes in urinary health and well being, changes in urinary habits or worsening sx and sx of incontinence. 10-10-2021: The patients brother states the patient is still having issues with urinary incontinence but it is not worse. He sees the specialist on a regular basis.  ; ?Provided education to patient re: routine follow up visits with the provider, monitoring for sx and sx of infection, especially UTI's. Calling the office for changes in urinary health and well being; ?Reviewed medications with patient and discussed compliance. The patient is taking Myrbetriq 50 mg and the patients brother states since he is taking this he is doing a lot better with not having "accidents". States the medication is working well. 10-10-2021: No changes with medications and the patient is stable.  ?Provided patient with urinary health and wellness  educational materials related to urinary incontinence ; ?Discussed plans with patient  for ongoing care management follow up and provided patient with direct contact information for care management team; ?Advised patient to discuss changes in urinary habits, questions, or concerns with provider; ?Review of housing situation. The patient is trying to get housing [facility]. Peer support person [name] is assisting the patient and his brother with this request. [name] states that he has not received any current updates about new housing at this time.  ?  ?Hyperlipidemia:  (Status: Goal on Track (progressing): YES.) Long Term Goal  ?     ?Lab Results  ?Component Value Date  ?  CHOL 142 09/10/2021  ?  HDL 48 09/10/2021  ?  [place] 78 09/10/2021  ?  TRIG 81 09/10/2021  ?  CHOLHDL 2.5 10/11/2015  ?  ?  ?Medication review performed; medication list updated in electronic medical record. 10-10-2021: The patient takes Lipitor 80 mg QD and is compliant with medications.  ?Provider established cholesterol goals reviewed. 10-10-2021: The patient is at goal; ?Counseled on importance of regular laboratory monitoring as prescribed. 10-10-2021:The patient has regular lab work ?Provided HLD educational materials; ?Reviewed role and benefits of statin for ASCVD risk reduction; ?Discussed strategies to manage statin-induced myalgias; ?Reviewed importance of limiting foods high in cholesterol; ?  ?Hypertension: (Status: Goal on Track (progressing): YES.) Long Term Goal  ?Last practice recorded BP readings:  ?   ?  BP Readings from Last 3 Encounters:  ?09/10/21 112/77  ?08/06/21 131/83  ?04/24/21 119/75  ?Most recent eGFR/CrCl:  ?     ?Lab Results  ?Component Value Date  ?  EGFR 78 09/10/2021  ?  No components found for: CRCL ?  ?Evaluation of current treatment plan related to hypertension self management and patient's adherence to plan as established by provider. 10-10-2021: The patients brother states that he is stable and denies any issues with HTN or heart health.   ?Provided education to patient  re: stroke prevention, s/s of heart attack and stroke; ?Reviewed prescribed diet heart healthy/ADA diet  ?Reviewed medications with patient and discussed importance of compliance. 10-10-2021: States compliance with heart healthy/AD

## 2021-10-12 ENCOUNTER — Ambulatory Visit: Payer: Medicare Other | Admitting: Medical

## 2021-10-15 ENCOUNTER — Ambulatory Visit: Payer: Medicare Other

## 2021-10-22 ENCOUNTER — Ambulatory Visit: Payer: Medicare Other

## 2021-10-25 ENCOUNTER — Ambulatory Visit: Payer: Medicare Other | Admitting: Podiatry

## 2021-10-29 ENCOUNTER — Ambulatory Visit: Payer: Medicare Other | Admitting: Podiatry

## 2021-10-29 ENCOUNTER — Telehealth: Payer: Self-pay | Admitting: Nurse Practitioner

## 2021-10-29 ENCOUNTER — Ambulatory Visit: Payer: Medicare Other

## 2021-10-29 NOTE — Telephone Encounter (Signed)
Copied from CRM [id number]. Topic: General - Other ?>> Oct 29, 2021 10:57 AM Randol Kern wrote: ?Reason for CRM: Kaylin calling from [facility] is faxing over a FL2 form request for the PCP to fill out and fax back as soon as possible. Pt is moving facilities.  ? ?Best contact: [phone] ?

## 2021-10-31 NOTE — Telephone Encounter (Signed)
Completed FL2 form faxed back over to [facility].  ?

## 2021-11-01 ENCOUNTER — Ambulatory Visit (INDEPENDENT_AMBULATORY_CARE_PROVIDER_SITE_OTHER): Payer: Medicare Other | Admitting: Physician Assistant

## 2021-11-01 ENCOUNTER — Encounter: Payer: Self-pay | Admitting: Physician Assistant

## 2021-11-01 VITALS — BP 128/83 | HR 75 | Ht 73.5 in | Wt 275.0 lb

## 2021-11-01 DIAGNOSIS — N3941 Urge incontinence: Secondary | ICD-10-CM

## 2021-11-01 NOTE — Patient Instructions (Addendum)
In order to start PTNS, we need your cardiologist to make sure that it is safe for you to have this treatment with your loop recorder in place. After your appointment on Tuesday, please call our office at [phone] to let us know.  ?

## 2021-11-01 NOTE — Progress Notes (Signed)
? ?11/01/2021 ?2:16 PM  ? ?[name] ?1971-04-13 ?[id number] ? ?CC: ?Chief Complaint  ?Patient presents with  ? Urinary Incontinence  ? ?HPI: ?[name] is a 51 y.o. male with PMH stroke, diabetes, and urge incontinence on Flomax and Gemtesa who presents today to discuss Foley catheter versus PTNS.  He is accompanied today by his brother, who contributes to HPI. ? ?PTNS was previously scheduled, but deferred because he needed cardiac clearance prior.  He missed his cardiac clearance appointment but is scheduled for another one next week. ? ?He reports continuing on [place] and Flomax.  He states his urinary symptoms are stable.  He denies dysuria. ? ?PMH: ?Past Medical History:  ?Diagnosis Date  ? Depression   ? Diabetes mellitus without complication ([place])   ? Diastolic dysfunction   ? a. 09/2020 Echo: EF 50-55%, no rwma, GrII DD. Nl RV size/fxn.  Mildly dil LA.  ? Hydrocephalus in adult [facility])   ? Hyperlipidemia   ? Hypertension   ? Seizures ([place])   ? Stroke [facility])   ? ? ?Surgical History: ?Past Surgical History:  ?Procedure Laterality Date  ? COLONOSCOPY WITH PROPOFOL N/A 03/08/2020  ? Procedure: COLONOSCOPY WITH PROPOFOL;  Surgeon: Virgel Manifold, MD;  Location: [facility] ENDOSCOPY;  Service: Endoscopy;  Laterality: N/A;  ? LOOP RECORDER IMPLANT  12/20/13  ? MDT LinQ implanted by Dr Lovena Le for cryptogenic stroke  ? LOOP RECORDER IMPLANT N/A 12/20/2013  ? Procedure: LOOP RECORDER IMPLANT;  Surgeon: Evans Lance, MD;  Location: [facility] CATH LAB;  Service: Cardiovascular;  Laterality: N/A;  ? ? ?Home Medications:  ?Allergies as of 11/01/2021   ?No Known Allergies ?  ? ?  ?Medication List  ?  ? ?  ? Accurate as of November 01, 2021  2:16 PM. If you have any questions, ask your nurse or doctor.  ?  ?  ? ?  ? ?Accu-Chek Softclix Lancets lancets ?Use to check blood sugars 2-3 times daily with goals = <130 fasting in morning and <180 two hours after eating.  Bring blood sugar log to appointments.  Dx E11.42 ?  ?acetaminophen  325 MG tablet ?Commonly known as: Tylenol ?Take 2 tablets (650 mg total) by mouth every 6 (six) hours as needed for mild pain. ?  ?amLODipine 10 MG tablet ?Commonly known as: NORVASC ?TAKE 1 TABLET BY MOUTH ONCE DAILY ?  ?Aspir-Low 81 MG EC tablet ?Generic drug: aspirin ?TAKE 1 TABLET BY MOUTH ONCE DAILY ?  ?atorvastatin 80 MG tablet ?Commonly known as: LIPITOR ?TAKE 1 TABLET BY MOUTH ONCE DAILY AT 6PM ?  ?Blood Pressure Monitor Automat Devi ?Use to monitor blood pressure once daily. ?  ?carvedilol 6.25 MG tablet ?Commonly known as: COREG ?TAKE 1 TABLET BY MOUTH TWICE DAILY WITH A MEAL ?  ?citalopram 10 MG tablet ?Commonly known as: CELEXA ?TAKE 1 TABLET BY MOUTH ONCE DAILY ?  ?clopidogrel 75 MG tablet ?Commonly known as: PLAVIX ?TAKE 1 TABLET BY MOUTH ONCE EVERY MORNING WITH BREAKFAST ?  ?Contour Next Monitor w/Device Kit ?Use to check blood sugar 3 times daily, fasting in morning with goal <130 and 2 hours after meals with goal <180.  Bring blood sugar log to visits.  Dx. E11.42 ?  ?Contour Next Test test strip ?Generic drug: glucose blood ?Use to check blood sugar 3 times daily, fasting in morning with goal <130 and 2 hours after meals with goal <180.  Bring blood sugar log to visits.  Dx. E11.42 ?  ?donepezil  5 MG tablet ?Commonly known as: ARICEPT ?Take 1 tablet (5 mg total) by mouth daily. ?  ?gabapentin 300 MG capsule ?Commonly known as: NEURONTIN ?TAKE 1 CAPSULE BY MOUTH 3 TIMES DAILY ?  ?Gemtesa 75 MG Tabs ?Generic drug: Vibegron ?Take 75 mg by mouth daily. ?  ?lisinopril 5 MG tablet ?Commonly known as: ZESTRIL ?TAKE 1 TABLET BY MOUTH ONCE DAILY ?  ?metFORMIN 500 MG 24 hr tablet ?Commonly known as: GLUCOPHAGE-XR ?Take 2 tablets (1,000 mg total) by mouth daily with breakfast. ?  ?multivitamin tablet ?Take 1 tablet by mouth daily. ?  ?mupirocin cream 2 % ?Commonly known as: Bactroban ?Apply 1 application topically 2 (two) times daily. ?  ?tamsulosin 0.4 MG Caps capsule ?Commonly known as: FLOMAX ?Take 0.4 mg  by mouth daily. ?  ?vitamin B-12 1000 MCG tablet ?Commonly known as: CYANOCOBALAMIN ?Take 1 tablet (1,000 mcg total) by mouth daily. ?  ? ?  ? ?Allergies:  ?No Known Allergies ? ?Family History: ?Family History  ?Problem Relation Age of Onset  ? Stroke Father   ? Heart disease Father   ? Hypertension Father   ? Hypertension Mother   ? Diabetes Mother   ? Arthritis Brother   ? Diabetes Maternal Grandfather   ? ?Social History:  ? reports that he has quit smoking. He has never used smokeless tobacco. He reports current alcohol use of about 9.0 standard drinks per week. He reports that he does not use drugs. ? ?Physical Exam: ?BP 128/83   Pulse 75   Ht 6' 1.5" (1.867 m)   Wt 275 lb (124.7 kg)   BMI 35.79 kg/m?   ?Constitutional:  Alert and oriented, no acute distress, nontoxic appearing ?HEENT: [place], AT ?Cardiovascular: No clubbing, cyanosis, or edema ?Respiratory: Normal respiratory effort, no increased work of breathing ?Skin: No rashes, bruises or suspicious lesions ?Neurologic: In wheelchair ?Psychiatric: Normal mood and affect ? ?Assessment & Plan:   ?1. Urge incontinence ?We discussed his treatment options at this point.  PTNS is a nonmedication option to improve urgency and urge incontinence.  We discussed that between 50 and 80% of patients notes symptomatic improvement with this therapy.  We discussed that we could only proceed with this if he receives cardiac clearance.  ? ?Alternatively, chronic indwelling Foley catheter would manage his urinary leakage, however it would increase his risk for urinary infection and he may develop bladder spasms that would cause urinary leakage around the tubing.  It would also require monthly catheter changes for the duration of catheterization. ? ?Based on this conversation, patient and his brother would like to proceed with PTNS.  I encouraged him to keep the cardio appointment for Tuesday and we will schedule PTNS if he receives clearance.  Otherwise, okay to schedule  for indwelling Foley catheter placement. ? ? ?Return for Will call to schedule PTNS pending cardiac clearance. ? ?Debroah Loop, PA-C ? ?[facility] ?[address] ?[place], [place] [address] ?[phone] ?   ?

## 2021-11-04 DIAGNOSIS — E1159 Type 2 diabetes mellitus with other circulatory complications: Secondary | ICD-10-CM | POA: Diagnosis not present

## 2021-11-04 DIAGNOSIS — R809 Proteinuria, unspecified: Secondary | ICD-10-CM

## 2021-11-04 DIAGNOSIS — E1169 Type 2 diabetes mellitus with other specified complication: Secondary | ICD-10-CM | POA: Diagnosis not present

## 2021-11-04 DIAGNOSIS — E785 Hyperlipidemia, unspecified: Secondary | ICD-10-CM | POA: Diagnosis not present

## 2021-11-04 DIAGNOSIS — E1129 Type 2 diabetes mellitus with other diabetic kidney complication: Secondary | ICD-10-CM | POA: Diagnosis not present

## 2021-11-04 DIAGNOSIS — I152 Hypertension secondary to endocrine disorders: Secondary | ICD-10-CM

## 2021-11-06 ENCOUNTER — Encounter: Payer: Self-pay | Admitting: Medical

## 2021-11-06 ENCOUNTER — Ambulatory Visit (INDEPENDENT_AMBULATORY_CARE_PROVIDER_SITE_OTHER): Payer: Medicare Other | Admitting: Medical

## 2021-11-06 VITALS — BP 106/80 | HR 70 | Ht 73.5 in | Wt 294.0 lb

## 2021-11-06 DIAGNOSIS — I639 Cerebral infarction, unspecified: Secondary | ICD-10-CM | POA: Diagnosis not present

## 2021-11-06 DIAGNOSIS — Z01810 Encounter for preprocedural cardiovascular examination: Secondary | ICD-10-CM

## 2021-11-06 DIAGNOSIS — E782 Mixed hyperlipidemia: Secondary | ICD-10-CM

## 2021-11-06 DIAGNOSIS — I10 Essential (primary) hypertension: Secondary | ICD-10-CM

## 2021-11-06 NOTE — Patient Instructions (Signed)
Medication Instructions:   Your physician recommends that you continue on your current medications as directed. Please refer to the Current Medication list given to you today.  *If you need a refill on your cardiac medications before your next appointment, please call your pharmacy*   Lab Work:  None ordered  Testing/Procedures:  None ordered   Follow-Up: At [facility], you and your health needs are our priority.  As part of our continuing mission to provide you with exceptional heart care, we have created designated Provider Care Teams.  These Care Teams include your primary Cardiologist (physician) and Advanced Practice Providers (APPs -  Physician Assistants and Nurse Practitioners) who all work together to provide you with the care you need, when you need it.  We recommend signing up for the patient portal called "MyChart".  Sign up information is provided on this After Visit Summary.  MyChart is used to connect with patients for Virtual Visits (Telemedicine).  Patients are able to view lab/test results, encounter notes, upcoming appointments, etc.  Non-urgent messages can be sent to your provider as well.   To learn more about what you can do with MyChart, go to [email or web address].    Your next appointment:   1 year(s)  The format for your next appointment:   In Person  Provider:   You may see Brian Agbor-Etang, MD or one of the following Advanced Practice Providers on your designated Care Team:   Christopher Berge, NP Ryan Dunn, PA-C Cadence Furth, PA-C{   Important Information About Sugar       

## 2021-11-06 NOTE — Progress Notes (Signed)
?Cardiology Office Note:   ? ?Date:  11/06/2021  ? ?ID:  [name], DOB 1971/03/26, MRN [id number] ? ?PCP:  Venita Lick, NP  ?[facility] Cardiologist:  Kate Sable, MD  ?[facility] Electrophysiologist:  None  ? ?Referring MD: Venita Lick, NP  ? ?Chief Complaint: Pre-op clearance ? ?History of Present Illness:   ? ?[name] is a 51 y.o. male with a hx of HTN, HLD, diabetes, h/o CVA, urge incontinence, chronic hydrocephalus, memory loss, difficulty walking  who presents for cardac eval pre-op.  ? ?Implantable loop recorder placed since 2015 after his stroke has not shown any arrhythmias such as A. fib or flutter. Echo 09/2020 showed LVEF 50-55%, G2DD. ? ?Last seen 09/07/20 and was overall doing well.  ? ?Today, the patient is here for cardiac clearance for PTNS for urge incontinence.  ?The patient recently started using a wheelchair. He can walk some. He doesn't walk a lot because sometimes he has fallen and feels off balance. He can dress hisself. He can cook and does some mild housework.  ? ?No chest pain, SOB, LLE, orthopnea, pnd, palpitations, dizziness, lightheadedness.  ? ?Past Medical History:  ?Diagnosis Date  ? Depression   ? Diabetes mellitus without complication ([place])   ? Diastolic dysfunction   ? a. 09/2020 Echo: EF 50-55%, no rwma, GrII DD. Nl RV size/fxn.  Mildly dil LA.  ? Hydrocephalus in adult [facility])   ? Hyperlipidemia   ? Hypertension   ? Seizures ([place])   ? Stroke [facility])   ? ? ?Past Surgical History:  ?Procedure Laterality Date  ? COLONOSCOPY WITH PROPOFOL N/A 03/08/2020  ? Procedure: COLONOSCOPY WITH PROPOFOL;  Surgeon: Virgel Manifold, MD;  Location: [facility] ENDOSCOPY;  Service: Endoscopy;  Laterality: N/A;  ? LOOP RECORDER IMPLANT  12/20/13  ? MDT LinQ implanted by Dr Lovena Le for cryptogenic stroke  ? LOOP RECORDER IMPLANT N/A 12/20/2013  ? Procedure: LOOP RECORDER IMPLANT;  Surgeon: Evans Lance, MD;  Location: [facility] CATH LAB;  Service: Cardiovascular;  Laterality: N/A;   ? ? ?Current Medications: ?Current Meds  ?Medication Sig  ? Accu-Chek Softclix Lancets lancets Use to check blood sugars 2-3 times daily with goals = <130 fasting in morning and <180 two hours after eating.  Bring blood sugar log to appointments.  Dx E11.42  ? acetaminophen (TYLENOL) 325 MG tablet Take 2 tablets (650 mg total) by mouth every 6 (six) hours as needed for mild pain.  ? amLODipine (NORVASC) 10 MG tablet TAKE 1 TABLET BY MOUTH ONCE DAILY  ? ASPIR-LOW 81 MG EC tablet TAKE 1 TABLET BY MOUTH ONCE DAILY  ? atorvastatin (LIPITOR) 80 MG tablet TAKE 1 TABLET BY MOUTH ONCE DAILY AT 6PM  ? Blood Glucose Monitoring Suppl (CONTOUR NEXT MONITOR) w/Device KIT Use to check blood sugar 3 times daily, fasting in morning with goal <130 and 2 hours after meals with goal <180.  Bring blood sugar log to visits.  Dx. E11.42  ? Blood Pressure Monitoring (BLOOD PRESSURE MONITOR AUTOMAT) DEVI Use to monitor blood pressure once daily.  ? carvedilol (COREG) 6.25 MG tablet TAKE 1 TABLET BY MOUTH TWICE DAILY WITH A MEAL  ? citalopram (CELEXA) 10 MG tablet TAKE 1 TABLET BY MOUTH ONCE DAILY  ? clopidogrel (PLAVIX) 75 MG tablet TAKE 1 TABLET BY MOUTH ONCE EVERY MORNING WITH BREAKFAST  ? donepezil (ARICEPT) 5 MG tablet Take 1 tablet (5 mg total) by mouth daily.  ? gabapentin (NEURONTIN) 300 MG capsule TAKE  1 CAPSULE BY MOUTH 3 TIMES DAILY  ? glucose blood (CONTOUR NEXT TEST) test strip Use to check blood sugar 3 times daily, fasting in morning with goal <130 and 2 hours after meals with goal <180.  Bring blood sugar log to visits.  Dx. E11.42  ? lisinopril (ZESTRIL) 5 MG tablet TAKE 1 TABLET BY MOUTH ONCE DAILY  ? metFORMIN (GLUCOPHAGE-XR) 500 MG 24 hr tablet Take 2 tablets (1,000 mg total) by mouth daily with breakfast.  ? Multiple Vitamin (MULTIVITAMIN) tablet Take 1 tablet by mouth daily.  ? mupirocin cream (BACTROBAN) 2 % Apply 1 application topically 2 (two) times daily.  ? tamsulosin (FLOMAX) 0.4 MG CAPS capsule Take 0.4 mg by  mouth daily.  ? Vibegron (GEMTESA) 75 MG TABS Take 75 mg by mouth daily.  ? vitamin B-12 (CYANOCOBALAMIN) 1000 MCG tablet Take 1 tablet (1,000 mcg total) by mouth daily.  ?  ? ?Allergies:   Patient has no known allergies.  ? ?Social History  ? ?Socioeconomic History  ? Marital status: Single  ?  Spouse name: Not on file  ? Number of children: Not on file  ? Years of education: Not on file  ? Highest education level: Not on file  ?Occupational History  ? Not on file  ?Tobacco Use  ? Smoking status: Former  ? Smokeless tobacco: Never  ? Tobacco comments:  ?  > 4 years quit  ?Vaping Use  ? Vaping Use: Never used  ?Substance and Sexual Activity  ? Alcohol use: Yes  ?  Alcohol/week: 9.0 standard drinks  ?  Types: 7 Cans of beer, 2 Standard drinks or equivalent per week  ?  Comment: in a week  ? Drug use: No  ? Sexual activity: Not Currently  ?Other Topics Concern  ? Not on file  ?Social History Narrative  ? Not on file  ? ?Social Determinants of Health  ? ?Financial Resource Strain: Low Risk   ? Difficulty of Paying Living Expenses: Not hard at all  ?Food Insecurity: No Food Insecurity  ? Worried About Charity fundraiser in the Last Year: Never true  ? Ran Out of Food in the Last Year: Never true  ?Transportation Needs: No Transportation Needs  ? Lack of Transportation (Medical): No  ? Lack of Transportation (Non-Medical): No  ?Physical Activity: Inactive  ? Days of Exercise per Week: 0 days  ? Minutes of Exercise per Session: 0 min  ?Stress: No Stress Concern Present  ? Feeling of Stress : Not at all  ?Social Connections: Socially Isolated  ? Frequency of Communication with Friends and Family: Three times a week  ? Frequency of Social Gatherings with Friends and Family: Three times a week  ? Attends Religious Services: Never  ? Active Member of Clubs or Organizations: No  ? Attends Archivist Meetings: Never  ? Marital Status: Never married  ?  ? ?Family History: ?The patient's family history includes  Arthritis in his brother; Diabetes in his maternal grandfather and mother; Heart disease in his father; Hypertension in his father and mother; Stroke in his brother and father. ? ?ROS:   ?Please see the history of present illness.    ? All other systems reviewed and are negative. ? ?EKGs/Labs/Other Studies Reviewed:   ? ?The following studies were reviewed today: ? ?Echo 09/2020 ? 1. Left ventricular ejection fraction, by estimation, is 50 to 55%. The  ?left ventricle has low normal function. The left ventricle has no regional  ?  wall motion abnormalities. Left ventricular diastolic parameters are  ?consistent with Grade II diastolic  ?dysfunction (pseudonormalization).  ? 2. Right ventricular systolic function is normal. The right ventricular  ?size is normal.  ? 3. Left atrial size was mildly dilated.  ? ?EKG:  EKG is  ordered today.  The ekg ordered today demonstrates NSR 70bpm, nonspecific ST, T wave changes ? ?Recent Labs: ?09/10/2021: ALT 15; BUN 14; Creatinine, Ser 1.15; Hemoglobin 13.9; Platelets 228; Potassium 3.9; Sodium 139; TSH 0.981  ?Recent Lipid Panel ?   ?Component Value Date/Time  ? CHOL 142 09/10/2021 1426  ? CHOL 123 12/02/2018 1323  ? CHOL 205 (H) 12/15/2013 0408  ? TRIG 81 09/10/2021 1426  ? TRIG 73 12/02/2018 1323  ? TRIG 154 12/15/2013 0408  ? HDL 48 09/10/2021 1426  ? HDL 49 12/15/2013 0408  ? CHOLHDL 2.5 10/11/2015 1027  ? VLDL 15 12/02/2018 1323  ? VLDL 31 12/15/2013 0408  ? [place]ell 78 09/10/2021 1426  ? LDLCALC 125 (H) 12/15/2013 0408  ? ? ?Physical Exam:   ? ?VS:  BP 106/80 (BP Location: Right Arm, Patient Position: Sitting, Cuff Size: Large)   Pulse 70   Ht 6' 1.5" (1.867 m)   Wt 294 lb (133.4 kg)   SpO2 97%   BMI 38.26 kg/m?    ? ?Wt Readings from Last 3 Encounters:  ?11/06/21 294 lb (133.4 kg)  ?11/01/21 275 lb (124.7 kg)  ?09/10/21 275 lb (124.7 kg)  ?  ? ?GEN:  Well nourished, well developed in no acute distress ?HEENT: Normal ?NECK: No JVD; No carotid bruits ?LYMPHATICS: No  lymphadenopathy ?CARDIAC: RRR, no murmurs, rubs, gallops ?RESPIRATORY:  Clear to auscultation without rales, wheezing or rhonchi  ?ABDOMEN: Soft, non-tender, non-distended ?MUSCULOSKELETAL:  No edema; No deformity  ?SKIN: Wa

## 2021-11-07 NOTE — Addendum Note (Signed)
Addended by: Thayer Headings, Angelly Spearing L on: 11/07/2021 12:53 PM ? ? Modules accepted: Orders ? ?

## 2021-11-15 ENCOUNTER — Ambulatory Visit (INDEPENDENT_AMBULATORY_CARE_PROVIDER_SITE_OTHER): Payer: Medicare Other | Admitting: Podiatry

## 2021-11-15 ENCOUNTER — Encounter: Payer: Self-pay | Admitting: Podiatry

## 2021-11-15 DIAGNOSIS — B351 Tinea unguium: Secondary | ICD-10-CM | POA: Diagnosis not present

## 2021-11-15 DIAGNOSIS — M79674 Pain in right toe(s): Secondary | ICD-10-CM | POA: Diagnosis not present

## 2021-11-15 DIAGNOSIS — E1142 Type 2 diabetes mellitus with diabetic polyneuropathy: Secondary | ICD-10-CM

## 2021-11-15 DIAGNOSIS — M79675 Pain in left toe(s): Secondary | ICD-10-CM

## 2021-11-15 NOTE — Progress Notes (Signed)
This patient returns to my office for at risk foot care.  This patient requires this care by a professional since this patient will be at risk due to having type 2 diabetes with neuropathy and coagulation defect due to plavix.  This patient is unable to cut nails himself since the patient cannot reach his nails.These nails are painful walking and wearing shoes.  Patient also requests diabetic shoes with a brace.  His brother made the request.This patient presents for at risk foot care today. ? ?General Appearance  Alert, conversant and in no acute stress. ? ?Vascular  Dorsalis pedis and posterior tibial  pulses are palpable  bilaterally.  Capillary return is within normal limits  bilaterally. Temperature is within normal limits  bilaterally. ? ?Neurologic  Senn-Weinstein monofilament wire test diminished  bilaterally. Muscle power within normal limits bilaterally. ? ?Nails Thick disfigured discolored nails with subungual debris  from hallux to fifth toes bilaterally. No evidence of bacterial infection or drainage bilaterally. ? ?Orthopedic  No limitations of motion  feet .  No crepitus or effusions noted.  No bony pathology or digital deformities noted. ? ?Skin  normotropic skin with no porokeratosis noted bilaterally.  No signs of infections or ulcers noted.    ? ?Onychomycosis  Pain in right toes  Pain in left toes ? ?Consent was obtained for treatment procedures.   Mechanical debridement of nails 1-5  bilaterally performed with a nail nipper.  Filed with dremel without incident.  Patient needs diabetic shoes with brace.  Patient to make an appointment with Arlys John. ? ? ?Return office visit   3 months                   Told patient to return for periodic foot care and evaluation due to potential at risk complications. ? ? ?Helane Gunther DPM   ?

## 2021-11-26 ENCOUNTER — Ambulatory Visit: Payer: Medicare Other

## 2021-11-26 DIAGNOSIS — M216X2 Other acquired deformities of left foot: Secondary | ICD-10-CM

## 2021-11-26 DIAGNOSIS — M216X1 Other acquired deformities of right foot: Secondary | ICD-10-CM

## 2021-11-26 DIAGNOSIS — E1142 Type 2 diabetes mellitus with diabetic polyneuropathy: Secondary | ICD-10-CM

## 2021-11-26 NOTE — Progress Notes (Signed)
SITUATION Reason for Consult: Evaluation for Prefabricated Diabetic Shoes and Custom Diabetic Inserts. Patient / Caregiver Report: Patient would like well fitting shoes  OBJECTIVE DATA: Patient History / Diagnosis:    ICD-10-CM   1. Diabetic polyneuropathy associated with type 2 diabetes mellitus (HCC)  E11.42     2. Other acquired deformities of left foot  M21.6X2     3. Other acquired deformities of right foot  M21.6X1       Physician Treating Diabetes:  Park Liter, MD  Current or Previous Devices:   Current user  In-Person Foot Examination: Ulcers & Callousing:   historical Deformities:    Other (unspecified) Sensation:    Compromised  Shoe Size:     15XW  ORTHOTIC RECOMMENDATION Recommended Devices: - 1x pair prefabricated PDAC approved diabetic shoes; Patient Selected Orthofeet Sprint Grey 672 Size 15XW - 3x pair custom-to-patient PDAC approved vacuum formed diabetic insoles.  GOALS OF SHOES AND INSOLES - Reduce shear and pressure - Reduce / Prevent callus formation - Reduce / Prevent ulceration - Protect the fragile healing compromised diabetic foot.  Patient would benefit from diabetic shoes and inserts as patient has diabetes mellitus and the patient has one or more of the following conditions: - History of partial or complete amputation of the foot - History of previous foot ulceration. - History of pre-ulcerative callus - Peripheral neuropathy with evidence of callus formation - Foot deformity - Poor circulation  ACTIONS PERFORMED Potential out of pocket cost was communicated to patient. Patient understood and consented to measurement and casting. Patient was casted for insoles via crush box and measured for shoes via brannock device. Procedure was explained and patient tolerated procedure well. All questions were answered and concerns addressed. CMN Sent to Treating Physician. Casts were shipped to central fabrication for HOLD until Certificate of Medical  Necessity or otherwise necessary authorization from insurance is obtained.  PLAN Shoes are to be ordered and casts released from hold once all appropriate paperwork is complete. Patient Patient expressed interest in new AFO. Will determine eligibility and see him for casting at next available in [place]. Patient is to be contacted and scheduled for fitting once shoes and insoles have been fabricated and received.

## 2021-12-07 ENCOUNTER — Ambulatory Visit: Payer: Medicare Other

## 2021-12-07 DIAGNOSIS — Z9181 History of falling: Secondary | ICD-10-CM

## 2021-12-07 DIAGNOSIS — M216X2 Other acquired deformities of left foot: Secondary | ICD-10-CM

## 2021-12-07 DIAGNOSIS — M216X1 Other acquired deformities of right foot: Secondary | ICD-10-CM

## 2021-12-07 NOTE — Progress Notes (Signed)
SITUATION Patient Name:  [name] MRN:   [id number] Reason for Visit: Evaluation for [facility] AFO  Patient Report: Chief Complaint:   Gait instability Ashby Dawes of Discomfort/Pain:  Ambulatory Location:    right lower extremity Onset & Duration:   Sudden and Present longer than 3 months Course:    gradually worsening Aggravating or Alleviating Factors: ambulation  OBJECTIVE DATA & MEASUREMENTS Prognosis:    Good Duration of use:   5 years  Diagnosis:   ICD-10-CM   1. Other acquired deformities of left foot  M21.6X2     2. Other acquired deformities of right foot  M21.6X1     3. History of falling, presenting hazards to health  Z91.81       GOALS, NECESSITIES, & JUSTIFICATIONS Recommended Device: [place] Gauntlet Color:    Black Closure:   Velcro  Laterality HCPCS Code Description Justification  right [phone] Plastic orthosis, custom molded from a model of the patient, custom fabricated, includes casting and cast preparation. Necessary to provide triplanar support to the foot/ankle complex  right L2330 Addition to lower extremity, lacer molded to patient model Necessary to ensure secure hold of orthosis to patient's limb  right L2820 Addition to lower extremity orthosis, soft interface for molded plastic below knee section Necessary to relieve pressure on bony prominences    I certify that [name] qualifies for and will benefit from an ankle foot orthosis used during ambulation based on meeting all of the following criteria;   The patient is: - Ambulatory, and - Has weakness or deformity of the foot and ankle, and - Requires stabilization for medical reasons, and - Has the potential to benefit functionally  The patient's medical record contains sufficient documentation of the patients medical condition to substantiate the necessity for the type and quantity of the items ordered.  The goals of this therapy: - Improve Mobility - Improve Lower Extremity  Stability - Decrease Pain - Facilitate Soft Tissue Healing - Facilitate Immobilization, healing and treatment of an injury  Necessity of Ankle Foot Orthotic molded to patient model: A custom (vs. prefabricated) ankle foot orthosis has been prescribed based on the following criteria which are specific to the condition of this patient; - The patient could not be fit with a prefabricated AFO - The condition necessitating the orthosis is expected to be permanent or of longstanding duration (more than 6 months) - There is need to control the ankle or foot in more than one plane - The patient has a documented neurological, circulatory, or orthopedic condition that requires custom fabrication over a model to prevent tissue injury - The patient has a healing fracture that lacks normal anatomical integrity or anthropometric proportions  I hereby certify that the ankle foot orthotic described above is a rigid or semi-rigid device which is used for the purpose of supporting a weak or deformed body member or restricting or eliminating motion in a diseased or injured part of the body. It is designed to provide support and counterforce on the limb or body part that is being braced. In my opinion, the custom molded ankle foot orthosis is both reasonable and necessary in reference to accepted standards of medical practice in the treatment  of the patient condition and rehabilitation.  ACTIONS PERFORMED Patient was evaluated and casted for [place] AFO via STS Casting Sock. Procedure was explained to patient. Patient tolerated procedure. patient selected device color and closure method.   PLAN Patient to return in four to six weeks for  fitting and delivery of device. Plan of care was explained to and agreed upon by patient. All questions were answered and concerns addressed.

## 2021-12-15 NOTE — Patient Instructions (Incomplete)

## 2021-12-18 ENCOUNTER — Ambulatory Visit: Payer: Medicare Other | Admitting: Nurse Practitioner

## 2021-12-18 ENCOUNTER — Ambulatory Visit: Payer: Medicare Other

## 2021-12-18 DIAGNOSIS — G910 Communicating hydrocephalus: Secondary | ICD-10-CM

## 2021-12-18 DIAGNOSIS — I152 Hypertension secondary to endocrine disorders: Secondary | ICD-10-CM

## 2021-12-18 DIAGNOSIS — Z8673 Personal history of transient ischemic attack (TIA), and cerebral infarction without residual deficits: Secondary | ICD-10-CM

## 2021-12-18 DIAGNOSIS — N3941 Urge incontinence: Secondary | ICD-10-CM

## 2021-12-18 DIAGNOSIS — E785 Hyperlipidemia, unspecified: Secondary | ICD-10-CM

## 2021-12-18 DIAGNOSIS — E1142 Type 2 diabetes mellitus with diabetic polyneuropathy: Secondary | ICD-10-CM

## 2021-12-18 DIAGNOSIS — I429 Cardiomyopathy, unspecified: Secondary | ICD-10-CM

## 2021-12-18 DIAGNOSIS — E1129 Type 2 diabetes mellitus with other diabetic kidney complication: Secondary | ICD-10-CM

## 2021-12-25 ENCOUNTER — Other Ambulatory Visit: Payer: Self-pay

## 2021-12-25 MED ORDER — GEMTESA 75 MG PO TABS: 75.0000 mg | ORAL_TABLET | Freq: Every day | ORAL | 3 refills | Status: DC

## 2021-12-26 ENCOUNTER — Other Ambulatory Visit: Payer: Self-pay | Admitting: Nurse Practitioner

## 2021-12-27 NOTE — Telephone Encounter (Signed)
Requested Prescriptions  Pending Prescriptions Disp Refills  . amLODipine (NORVASC) 10 MG tablet [Pharmacy Med Name: AMLODIPINE BESYLATE 10 MG TAB] 90 tablet 0    Sig: TAKE 1 TABLET BY MOUTH ONCE DAILY     Cardiovascular: Calcium Channel Blockers 2 Passed - 12/26/2021  3:50 PM      Passed - Last BP in normal range    BP Readings from Last 1 Encounters:  11/06/21 106/80         Passed - Last Heart Rate in normal range    Pulse Readings from Last 1 Encounters:  11/06/21 70         Passed - Valid encounter within last 6 months    Recent Outpatient Visits          3 months ago Type 2 diabetes mellitus with proteinuria (HCC)   [facility] [place], [place] T, NP   8 months ago Great toe pain, left   [employer], Lauren A, NP   10 months ago Type 2 diabetes mellitus with diabetic polyneuropathy, without long-term current use of insulin (HCC)   [facility] [place], [place] T, NP   1 year ago Type 2 diabetes mellitus with proteinuria (HCC)   [facility] [place], [name] T, NP   1 year ago Type 2 diabetes mellitus with proteinuria (HCC)   [facility] [place], [name] T, NP             . lisinopril (ZESTRIL) 5 MG tablet [Pharmacy Med Name: LISINOPRIL 5 MG TAB] 90 tablet 0    Sig: TAKE 1 TABLET BY MOUTH ONCE DAILY     Cardiovascular:  ACE Inhibitors Passed - 12/26/2021  3:50 PM      Passed - Cr in normal range and within 180 days    Creatinine  Date Value Ref Range Status  07/07/2014 1.19 0.60 - 1.30 mg/dL Final   Creatinine, Ser  Date Value Ref Range Status  09/10/2021 1.15 0.76 - 1.27 mg/dL Final         Passed - K in normal range and within 180 days    Potassium  Date Value Ref Range Status  09/10/2021 3.9 3.5 - 5.2 mmol/L Final  07/07/2014 3.7 3.5 - 5.1 mmol/L Final         Passed - Patient is not pregnant      Passed - Last BP in normal range    BP Readings from Last 1 Encounters:  11/06/21  106/80         Passed - Valid encounter within last 6 months    Recent Outpatient Visits          3 months ago Type 2 diabetes mellitus with proteinuria (HCC)   [facility] [place], [place] T, NP   8 months ago Great toe pain, left   [employer], Lauren A, NP   10 months ago Type 2 diabetes mellitus with diabetic polyneuropathy, without long-term current use of insulin (HCC)   [facility] [place], [place] T, NP   1 year ago Type 2 diabetes mellitus with proteinuria (HCC)   [facility] [place], [name] T, NP   1 year ago Type 2 diabetes mellitus with proteinuria (HCC)   [facility] [place], [name] T, NP             . atorvastatin (LIPITOR) 80 MG tablet [Pharmacy Med Name: ATORVASTATIN CALCIUM 80 MG TAB] 90 tablet 0    Sig: TAKE  1 TABLET BY MOUTH ONCE DAILY AT 6PM     Cardiovascular:  Antilipid - Statins Failed - 12/26/2021  3:50 PM      Failed - Lipid Panel in normal range within the last 12 months    Cholesterol, Total  Date Value Ref Range Status  09/10/2021 142 100 - 199 mg/dL Final   Cholesterol  Date Value Ref Range Status  12/15/2013 205 (H) 0 - 200 mg/dL Final   Cholesterol [place], Waived  Date Value Ref Range Status  12/02/2018 123 <200 mg/dL Final    Comment:                            Desirable                <200                         Borderline High      200- 239                         High                     >239    Ldl Cholesterol, Calc  Date Value Ref Range Status  12/15/2013 125 (H) 0 - 100 mg/dL Final   LDL Chol Calc (NIH)  Date Value Ref Range Status  09/10/2021 78 0 - 99 mg/dL Final   HDL Cholesterol  Date Value Ref Range Status  12/15/2013 49 40 - 60 mg/dL Final   HDL  Date Value Ref Range Status  09/10/2021 48 >39 mg/dL Final   Triglycerides  Date Value Ref Range Status  09/10/2021 81 0 - 149 mg/dL Final  77/82/4235 361 0 - 200 mg/dL Final   Triglycerides  Piccolo,Waived  Date Value Ref Range Status  12/02/2018 73 <150 mg/dL Final    Comment:                            Normal                   <150                         Borderline High     150 - 199                         High                200 - 499                         Very High                >499          Passed - Patient is not pregnant      Passed - Valid encounter within last 12 months    Recent Outpatient Visits          3 months ago Type 2 diabetes mellitus with proteinuria (HCC)   [facility] [place], [place] T, NP   8 months ago Great toe pain, left   [employer], Lauren A, NP   10 months ago Type 2 diabetes mellitus with diabetic polyneuropathy, without long-term  current use of insulin (HCC)   [facility] [employer], [place] T, NP   1 year ago Type 2 diabetes mellitus with proteinuria (HCC)   [facility] [place], [place] T, NP   1 year ago Type 2 diabetes mellitus with proteinuria (HCC)   [facility] [place], [name] T, NP             . citalopram (CELEXA) 10 MG tablet [Pharmacy Med Name: CITALOPRAM HYDROBROMIDE 10 MG TAB] 90 tablet 0    Sig: TAKE 1 TABLET BY MOUTH ONCE DAILY     Psychiatry:  Antidepressants - SSRI Passed - 12/26/2021  3:50 PM      Passed - Valid encounter within last 6 months    Recent Outpatient Visits          3 months ago Type 2 diabetes mellitus with proteinuria (HCC)   [facility] [employer], [place] T, NP   8 months ago Great toe pain, left   [facility] [place], Lauren A, NP   10 months ago Type 2 diabetes mellitus with diabetic polyneuropathy, without long-term current use of insulin (HCC)   [facility] [place], [place] T, NP   1 year ago Type 2 diabetes mellitus with proteinuria (HCC)   [facility] [place], [name] T, NP   1 year ago Type 2 diabetes mellitus with proteinuria (HCC)   [facility]  [place], [name] T, NP             . carvedilol (COREG) 6.25 MG tablet [Pharmacy Med Name: CARVEDILOL 6.25 MG TAB] 180 tablet 0    Sig: TAKE 1 TABLET BY MOUTH TWICE DAILY WITH A MEAL     Cardiovascular: Beta Blockers 3 Passed - 12/26/2021  3:50 PM      Passed - Cr in normal range and within 360 days    Creatinine  Date Value Ref Range Status  07/07/2014 1.19 0.60 - 1.30 mg/dL Final   Creatinine, Ser  Date Value Ref Range Status  09/10/2021 1.15 0.76 - 1.27 mg/dL Final         Passed - AST in normal range and within 360 days    AST  Date Value Ref Range Status  09/10/2021 17 0 - 40 IU/L Final   SGOT(AST)  Date Value Ref Range Status  07/07/2014 10 (L) 15 - 37 Unit/L Final         Passed - ALT in normal range and within 360 days    ALT  Date Value Ref Range Status  09/10/2021 15 0 - 44 IU/L Final   SGPT (ALT)  Date Value Ref Range Status  07/07/2014 15 U/L Final    Comment:    14-63 NOTE: New Reference Range 01/25/14          Passed - Last BP in normal range    BP Readings from Last 1 Encounters:  11/06/21 106/80         Passed - Last Heart Rate in normal range    Pulse Readings from Last 1 Encounters:  11/06/21 70         Passed - Valid encounter within last 6 months    Recent Outpatient Visits          3 months ago Type 2 diabetes mellitus with proteinuria (HCC)   [facility] [place], [place] T, NP   8 months ago Great toe pain, left   [employer], Lauren A, NP   10 months  ago Type 2 diabetes mellitus with diabetic polyneuropathy, without long-term current use of insulin (HCC)   [facility] [place], [place] T, NP   1 year ago Type 2 diabetes mellitus with proteinuria (HCC)   [facility] [place], [place] T, NP   1 year ago Type 2 diabetes mellitus with proteinuria (HCC)   [facility] [place], Dorie Rank, NP

## 2022-01-02 ENCOUNTER — Telehealth: Payer: Medicare Other

## 2022-01-02 ENCOUNTER — Ambulatory Visit (INDEPENDENT_AMBULATORY_CARE_PROVIDER_SITE_OTHER): Payer: Medicare Other

## 2022-01-02 DIAGNOSIS — I10 Essential (primary) hypertension: Secondary | ICD-10-CM

## 2022-01-02 DIAGNOSIS — N3941 Urge incontinence: Secondary | ICD-10-CM

## 2022-01-02 DIAGNOSIS — Z8673 Personal history of transient ischemic attack (TIA), and cerebral infarction without residual deficits: Secondary | ICD-10-CM

## 2022-01-02 DIAGNOSIS — E1129 Type 2 diabetes mellitus with other diabetic kidney complication: Secondary | ICD-10-CM

## 2022-01-02 DIAGNOSIS — E1169 Type 2 diabetes mellitus with other specified complication: Secondary | ICD-10-CM

## 2022-01-02 DIAGNOSIS — E1159 Type 2 diabetes mellitus with other circulatory complications: Secondary | ICD-10-CM

## 2022-01-02 DIAGNOSIS — G918 Other hydrocephalus: Secondary | ICD-10-CM

## 2022-01-02 DIAGNOSIS — G910 Communicating hydrocephalus: Secondary | ICD-10-CM

## 2022-01-02 NOTE — Chronic Care Management (AMB) (Signed)
Chronic Care Management   CCM RN Visit Note  01/02/2022 Name: [name] MRN: [id number] DOB: September 21, 1970  Subjective: [name] is a 51 y.o. year old male who is a primary care patient of Cannady, Barbaraann Faster, NP. The care management team was consulted for assistance with disease management and care coordination needs.    Engaged with patient by telephone for follow up visit in response to provider referral for case management and/or care coordination services. Spoke to the patients brother [name], patient was on speaker phone with his brother.  Consent to Services:  The patient was given information about Chronic Care Management services, agreed to services, and gave verbal consent prior to initiation of services.  Please see initial visit note for detailed documentation.   Patient agreed to services and verbal consent obtained.   Assessment: Review of patient past medical history, allergies, medications, health status, including review of consultants reports, laboratory and other test data, was performed as part of comprehensive evaluation and provision of chronic care management services.   SDOH (Social Determinants of Health) assessments and interventions performed:    CCM Care Plan  No Known Allergies  Outpatient Encounter Medications as of 01/02/2022  Medication Sig   Accu-Chek Softclix Lancets lancets Use to check blood sugars 2-3 times daily with goals = <130 fasting in morning and <180 two hours after eating.  Bring blood sugar log to appointments.  Dx E11.42   acetaminophen (TYLENOL) 325 MG tablet Take 2 tablets (650 mg total) by mouth every 6 (six) hours as needed for mild pain.   amLODipine (NORVASC) 10 MG tablet TAKE 1 TABLET BY MOUTH ONCE DAILY   ASPIR-LOW 81 MG EC tablet TAKE 1 TABLET BY MOUTH ONCE DAILY   atorvastatin (LIPITOR) 80 MG tablet TAKE 1 TABLET BY MOUTH ONCE DAILY AT 6PM   Blood Glucose Monitoring Suppl (CONTOUR NEXT MONITOR) w/Device KIT Use to check blood  sugar 3 times daily, fasting in morning with goal <130 and 2 hours after meals with goal <180.  Bring blood sugar log to visits.  Dx. E11.42   Blood Pressure Monitoring (BLOOD PRESSURE MONITOR AUTOMAT) DEVI Use to monitor blood pressure once daily.   carvedilol (COREG) 6.25 MG tablet TAKE 1 TABLET BY MOUTH TWICE DAILY WITH A MEAL   citalopram (CELEXA) 10 MG tablet TAKE 1 TABLET BY MOUTH ONCE DAILY   clopidogrel (PLAVIX) 75 MG tablet TAKE 1 TABLET BY MOUTH ONCE EVERY MORNING WITH BREAKFAST   donepezil (ARICEPT) 5 MG tablet Take 1 tablet (5 mg total) by mouth daily.   gabapentin (NEURONTIN) 300 MG capsule TAKE 1 CAPSULE BY MOUTH 3 TIMES DAILY   glucose blood (CONTOUR NEXT TEST) test strip Use to check blood sugar 3 times daily, fasting in morning with goal <130 and 2 hours after meals with goal <180.  Bring blood sugar log to visits.  Dx. E11.42   lisinopril (ZESTRIL) 5 MG tablet TAKE 1 TABLET BY MOUTH ONCE DAILY   metFORMIN (GLUCOPHAGE-XR) 500 MG 24 hr tablet Take 2 tablets (1,000 mg total) by mouth daily with breakfast.   Multiple Vitamin (MULTIVITAMIN) tablet Take 1 tablet by mouth daily.   mupirocin cream (BACTROBAN) 2 % Apply 1 application topically 2 (two) times daily.   MYRBETRIQ 50 MG TB24 tablet Take 50 mg by mouth daily.   tamsulosin (FLOMAX) 0.4 MG CAPS capsule Take 0.4 mg by mouth daily.   Vibegron (GEMTESA) 75 MG TABS Take 75 mg by mouth daily.   vitamin B-12 (  CYANOCOBALAMIN) 1000 MCG tablet Take 1 tablet (1,000 mcg total) by mouth daily.   No facility-administered encounter medications on file as of 01/02/2022.    Patient Active Problem List   Diagnosis Date Noted   Pain due to onychomycosis of toenails of both feet 11/15/2021   Poor mobility 09/06/2020   Type 2 diabetes mellitus with proteinuria ([place]) 12/31/2019   Incontinence of feces 12/10/2019   Urge incontinence of urine 12/10/2019   Chronic communicating hydrocephalus ([place]) 04/22/2019   Vitamin B12 deficiency 12/02/2018    Chronic pain of right ankle 12/18/2015   Obesity 10/16/2015   Hyperlipidemia associated with type 2 diabetes mellitus ([place]) 09/13/2015   History of stroke 12/17/2013   Hypertension associated with diabetes ([place]) 12/17/2013   Cardiomyopathy ([place]) 12/17/2013   Type 2 diabetes mellitus with diabetic polyneuropathy ([place]) 12/17/2013    Conditions to be addressed/monitored:HTN, HLD, DMII, and history of CVA with brain syndrome, and urinary incontinence  Care Plan : RNCM: General Plan of Care (Adult) for Chronic Disease Management and Care Coordination Needs  Updates made by Vanita Ingles, RN since 01/02/2022 12:00 AM     Problem: RNCM: Development of Plan of care for Chronic Disease Management (HTN, HLD, DM, Urinary Incontinence, history of CVA with brain syndrome)   Priority: High     Long-Range Goal: RNCM: Effective Management of Plan of care for Chronic Disease Management (HTN, HLD, DM, Urinary Incontinence, history of CVA with brain syndrome)   Start Date: 08/15/2021  Expected End Date: 08/15/2022  Priority: High  Note:   Current Barriers:  Knowledge Deficits related to plan of care for management of HTN, HLD, DMII, and Urinary incontinence, and history of CVA with brain hydrocephalus syndrome   Care Coordination needs related to Level of care concerns and Mental Health Concerns   Chronic Disease Management support and education needs related to HTN, HLD, DMII, and urinary incontinence, history of CVA with brain hydrocephalus syndrome Lacks caregiver support.        Cognitive Deficits  RNCM Clinical Goal(s):  Patient will verbalize understanding of plan for management of HTN, HLD, DMII, and Urinary Incontinence and history of brain hydrocephalus syndrome as evidenced by keeping appointments, stable conditions, calling the office for questions or concerns and working with the CCM team to optimize health and well being  take all medications exactly as prescribed and will call provider for  medication related questions as evidenced by compliance with medications and calling for refills before running out of medications    attend all scheduled medical appointments: keep appointments with pcp  as evidenced by compliance with routine office visits and calling  the office for schedule change needs         demonstrate improved and ongoing adherence to prescribed treatment plan for HTN, HLD, DMII, and urinary incontinence, and  history of stroke with brain hydrocephalus  as evidenced by VS stable, lab work stable, no exacerbations of chronic conditions, and patient able to maintain health and well being continue to work with Consulting civil engineer and/or Social Worker to address care management and care coordination needs related to HTN, HLD, DMII, and urinary incontinence and history of stroke with brain syndrome as evidenced by adherence to CM Team Scheduled appointments     demonstrate ongoing self health care management ability effective management of chronic conditions as evidenced by working with the CCM team  through collaboration with Consulting civil engineer, provider, and care team.   Interventions: 1:1 collaboration with primary care provider regarding  development and update of comprehensive plan of care as evidenced by provider attestation and co-signature Inter-disciplinary care team collaboration (see longitudinal plan of care) Evaluation of current treatment plan related to  self management and patient's adherence to plan as established by provider   Diabetes:  (Status: Goal on Track (progressing): YES.) Long Term Goal   Lab Results  Component Value Date   HGBA1C 7.6 (H) 09/10/2021    Assessed patient's understanding of A1c goal: <7%. 10-10-2021: The patient with elevated A1C from 5.5 yesterday. Education given to the patients brother. 01-02-2022: The patients brother states he has been doing well with management of his DM.  Provided education to patient about basic DM disease  process; Reviewed medications with patient and discussed importance of medication adherence. 10-10-2021; States compliance with medications. Has increased Metformin without any difficulty. 01-02-2022: States compliance with his medications       Reviewed prescribed diet with patient heart healthy/ADA diet. 01-02-2022: The brother denies any issues with appetite or following of dietary restrictions ; Counseled on importance of regular laboratory monitoring as prescribed. 01-02-2022: Has regular lab testing;        Discussed plans with patient for ongoing care management follow up and provided patient with direct contact information for care management team;      Provided patient with written educational materials related to hypo and hyperglycemia and importance of correct treatment. 01-02-2022: Review of hypo and hyperglycemia. The patients brother assist with his care and denies any issues with his DM management ;       Advised patient, providing education and rationale, to check cbg twice daily, when you have symptoms of low or high blood sugar, and before and after exercise and record. 08-15-2021: Per the patients brother his blood sugars are ranging from 80-150's. Denies any issues with DM management. 10-10-2021: The patients brother states he has not been taking because he ran out of strips for the meter and he will have to pay for additional strips. Will ask the pharm D to reach out to the patient and assist with this expressed need.   01-02-2022: The patients brother states his blood sugars have been stable. He says he has been having his own health issues and he could not provide any numbers or the [facility] call provider for findings outside established parameters;       Review of patient status, including review of consultants reports, relevant laboratory and other test results, and medications completed;       Screening for signs and symptoms of depression related to chronic disease state;        Assessed social  determinant of health barriers;  Last vision test in December 2022 per the patients brother      Will have a diabetic foot exam on 10-25-2021. 01-02-2022: Had exam with podiatrist and has seen the specialist for diabetic shoes. Is waiting for paperwork and approval. Has been fitted  Urinary Incontinence   (Status: Goal on Track (progressing): YES.) Long Term Goal  Evaluation of current treatment plan related to  Urinary incontinence , Limited social support, Level of care concerns, Mental Health Concerns , and Memory Deficits self-management and patient's adherence to plan as established by provider. 01-02-2022: The patient saw the specialist on 11-01-2021 and received cardiac clearance for PTNS on 11-06-2021. The patients brother [name], who assist in his care, had a stroke and was in the hospital for 2 weeks, he is just getting home and settled and is doing better. He states that  Jeray missed a lot of his appointments because he was in the hospital but he is trying to get everything back on track with Randall Hiss and himself and will see [name] soon. He states that his urinary incontinence is about the same. Education and support given. Discussed plans with patient for ongoing care management follow up and provided patient with direct contact information for care management team Advised patient to call the specialist for changes in urinary health and well being, changes in urinary habits or worsening sx and sx of incontinence. 01-02-2022: The patients brother states the patient is still having issues with urinary incontinence but it is not worse. He sees the specialist on a regular basis.  ; Provided education to patient re: routine follow up visits with the provider, monitoring for sx and sx of infection, especially UTI's. Calling the office for changes in urinary health and well being; Reviewed medications with patient and discussed compliance. The patient is taking Myrbetriq 50 mg and the patients brother states since  he is taking this he is doing a lot better with not having "accidents". States the medication is working well. 01-02-2022: No changes with medications and the patient is stable.  Provided patient with urinary health and wellness  educational materials related to urinary incontinence ; Discussed plans with patient for ongoing care management follow up and provided patient with direct contact information for care management team; Advised patient to discuss changes in urinary habits, questions, or concerns with provider; Review of housing situation. The patient is trying to get housing through [facility]. Peer support person [name] is assisting the patient and his brother with this request. [name] states that he has not received any current updates about new housing at this time.   Hyperlipidemia:  (Status: Goal on Track (progressing): YES.) Long Term Goal  Lab Results  Component Value Date   CHOL 142 09/10/2021   HDL 48 09/10/2021   LDLCALC 78 09/10/2021   TRIG 81 09/10/2021   CHOLHDL 2.5 10/11/2015     Medication review performed; medication list updated in electronic medical record. 01-02-2022: The patient takes Lipitor 80 mg QD and is compliant with medications.  Provider established cholesterol goals reviewed. 01-02-2022: The patient is at goal; Counseled on importance of regular laboratory monitoring as prescribed. 01-02-2022:The patient has regular lab work Provided HLD Scientist, clinical (histocompatibility and immunogenetics); Reviewed role and benefits of statin for ASCVD risk reduction; Discussed strategies to manage statin-induced myalgias; Reviewed importance of limiting foods high in cholesterol;  Hypertension: (Status: Goal on Track (progressing): YES.) Long Term Goal  Last practice recorded BP readings:  BP Readings from Last 3 Encounters:  11/06/21 106/80  11/01/21 128/83  09/10/21 112/77  Most recent eGFR/CrCl:  Lab Results  Component Value Date   EGFR 78 09/10/2021    No components  found for: CRCL  Evaluation of current treatment plan related to hypertension self management and patient's adherence to plan as established by provider. 01-02-2022: The patients brother states that he is stable and denies any issues with HTN or heart health.   Provided education to patient re: stroke prevention, s/s of heart attack and stroke; Reviewed prescribed diet heart healthy/ADA diet  Reviewed medications with patient and discussed importance of compliance. 01-02-2022: States compliance with heart healthy/ADA diet.;  Discussed plans with patient for ongoing care management follow up and provided patient with direct contact information for care management team; Advised patient, providing education and rationale, to monitor blood pressure daily and record, calling PCP for findings  outside established parameters;  Advised patient to discuss changes in blood pressure or heart health with provider; Provided education on prescribed diet heart healthy/ADA diet ;  Discussed complications of poorly controlled blood pressure such as heart disease, stroke, circulatory complications, vision complications, kidney impairment, sexual dysfunction;    History of CVA with brain hydrocephalus syndrome  (Status: Goal on Track (progressing): YES.) Long Term Goal  Evaluation of current treatment plan related to  History of CVA with brain hydrocephalus syndrome , Memory Deficits self-management and patient's adherence to plan as established by provider. 01-02-2022: The patients brother states that he is stable and denies any acute changes. He is sleeping well and will have upcoming appointments for follow up for chronic conditions. States that Jayven was by himself when [name] was in the hospital but he did well. They have help coming into the home on a regular basis.  Discussed plans with patient for ongoing care management follow up and provided patient with direct contact information for care management team Advised  patient to call the office for changes in mood, anxiety, depression, or residual effects from past CVA with brain syndrome. His brother lives with him and assist in his care. He has a peer support person that also aides in management of his care. The patient has what he needs and follows up accordingly with the specialist and pcp; Provided education to patient re: changes in neurologist health and well being and to call the office for questions of concerns; Reviewed medications with patient and discussed compliance the patient is compliant with medications; Provided patient with neurological changes, safety concerns with ambulation and fall prevention educational materials related to past history of stroke with brain syndrome; Discussed plans with patient for ongoing care management follow up and provided patient with direct contact information for care management team; Screening for signs and symptoms of depression related to chronic disease state;  Assessed social determinant of health barriers;  The patient is looking into housing with  the [facility]. They have filled out paperwork and are waiting to hear back from the process. [name] is assisting the patient and his brother with the paperwork for this. Will continue to monitor.    Patient Goals/Self-Care Activities: Take medications as prescribed   Attend all scheduled provider appointments Call pharmacy for medication refills 3-7 days in advance of running out of medications Attend church or other social activities Perform all self care activities independently  Perform IADL's (shopping, preparing meals, housekeeping, managing finances) independently Call provider office for new concerns or questions  Work with the social worker to address care coordination needs and will continue to work with the clinical team to address health care and disease management related needs call the Suicide and Crisis Lifeline: 988 call  the [place] National Suicide Prevention Lifeline: 769-453-0365 or TTY: 858 627 3269 TTY (662)493-9855) to talk to a trained counselor call 1-800-273-TALK (toll free, 24 hour hotline) if experiencing a Mental Health or [place]  keep appointment with eye doctor check blood sugar at prescribed times: twice daily, when you have symptoms of low or high blood sugar, and before and after exercise check feet daily for cuts, sores or redness enter blood sugar readings and medication or insulin into daily log take the blood sugar log to all doctor visits trim toenails straight across. 08-15-2021: Saw the podiatrist recently and had nails trimmed. Will go back in 3 months. The patient will also be evaluated for diabetic shoes.  drink 6 to 8  glasses of water each day eat fish at least once per week fill half of plate with vegetables join a weight loss program limit fast food meals to no more than 1 per week manage portion size prepare main meal at home 3 to 5 days each week read food labels for fat, fiber, carbohydrates and portion size reduce red meat to 2 to 3 times a week switch to sugar-free drinks do heel pump exercise 2 to 3 times each day keep feet up while sitting wash and dry feet carefully every day wear comfortable, cotton socks wear comfortable, well-fitting shoes check blood pressure 3 times per week choose a place to take my blood pressure (home, clinic or office, retail store) write blood pressure results in a log or diary learn about high blood pressure keep a blood pressure log take blood pressure log to all doctor appointments call doctor for signs and symptoms of high blood pressure develop an action plan for high blood pressure keep all doctor appointments take medications for blood pressure exactly as prescribed report new symptoms to your doctor eat more whole grains, fruits and vegetables, lean meats and healthy fats - call for medicine refill 2 or 3 days  before it runs out - take all medications exactly as prescribed - call doctor with any symptoms you believe are related to your medicine - call doctor when you experience any new symptoms - go to all doctor appointments as scheduled - adhere to prescribed diet: heart healthy/ADA diet       Plan:Telephone follow up appointment with care management team member scheduled for:  02-20-2022 at 4 pm  Noreene Larsson RN, MSN, [facility] Mobile: [phone]

## 2022-01-02 NOTE — Patient Instructions (Signed)
Visit Information  Thank you for taking time to visit with me today. Please don't hesitate to contact me if I can be of assistance to you before our next scheduled telephone appointment.  Following are the goals we discussed today:  Diabetes:  (Status: Goal on Track (progressing): YES.) Long Term Goal         Lab Results  Component Value Date    HGBA1C 7.6 (H) 09/10/2021    Assessed patient's understanding of A1c goal: <7%. 10-10-2021: The patient with elevated A1C from 5.5 yesterday. Education given to the patients brother. 01-02-2022: The patients brother states he has been doing well with management of his DM.  Provided education to patient about basic DM disease process; Reviewed medications with patient and discussed importance of medication adherence. 10-10-2021; States compliance with medications. Has increased Metformin without any difficulty. 01-02-2022: States compliance with his medications       Reviewed prescribed diet with patient heart healthy/ADA diet. 01-02-2022: The brother denies any issues with appetite or following of dietary restrictions ; Counseled on importance of regular laboratory monitoring as prescribed. 01-02-2022: Has regular lab testing;        Discussed plans with patient for ongoing care management follow up and provided patient with direct contact information for care management team;      Provided patient with written educational materials related to hypo and hyperglycemia and importance of correct treatment. 01-02-2022: Review of hypo and hyperglycemia. The patients brother assist with his care and denies any issues with his DM management ;       Advised patient, providing education and rationale, to check cbg twice daily, when you have symptoms of low or high blood sugar, and before and after exercise and record. 08-15-2021: Per the patients brother his blood sugars are ranging from 80-150's. Denies any issues with DM management. 10-10-2021: The patients brother states he  has not been taking because he ran out of strips for the meter and he will have to pay for additional strips. Will ask the pharm D to reach out to the patient and assist with this expressed need.   01-02-2022: The patients brother states his blood sugars have been stable. He says he has been having his own health issues and he could not provide any numbers or the [facility] call provider for findings outside established parameters;       Review of patient status, including review of consultants reports, relevant laboratory and other test results, and medications completed;       Screening for signs and symptoms of depression related to chronic disease state;        Assessed social determinant of health barriers;  Last vision test in December 2022 per the patients brother      Will have a diabetic foot exam on 10-25-2021. 01-02-2022: Had exam with podiatrist and has seen the specialist for diabetic shoes. Is waiting for paperwork and approval. Has been fitted   Urinary Incontinence   (Status: Goal on Track (progressing): YES.) Long Term Goal  Evaluation of current treatment plan related to  Urinary incontinence , Limited social support, Level of care concerns, Mental Health Concerns , and Memory Deficits self-management and patient's adherence to plan as established by provider. 01-02-2022: The patient saw the specialist on 11-01-2021 and received cardiac clearance for PTNS on 11-06-2021. The patients brother [name], who assist in his care, had a stroke and was in the hospital for 2 weeks, he is just getting home and settled and is doing  better. He states that Jeyson missed a lot of his appointments because he was in the hospital but he is trying to get everything back on track with Randall Hiss and himself and will see [name] soon. He states that his urinary incontinence is about the same. Education and support given. Discussed plans with patient for ongoing care management follow up and provided patient with direct contact  information for care management team Advised patient to call the specialist for changes in urinary health and well being, changes in urinary habits or worsening sx and sx of incontinence. 01-02-2022: The patients brother states the patient is still having issues with urinary incontinence but it is not worse. He sees the specialist on a regular basis.  ; Provided education to patient re: routine follow up visits with the provider, monitoring for sx and sx of infection, especially UTI's. Calling the office for changes in urinary health and well being; Reviewed medications with patient and discussed compliance. The patient is taking Myrbetriq 50 mg and the patients brother states since he is taking this he is doing a lot better with not having "accidents". States the medication is working well. 01-02-2022: No changes with medications and the patient is stable.  Provided patient with urinary health and wellness  educational materials related to urinary incontinence ; Discussed plans with patient for ongoing care management follow up and provided patient with direct contact information for care management team; Advised patient to discuss changes in urinary habits, questions, or concerns with provider; Review of housing situation. The patient is trying to get housing through [facility]. Peer support person [name] is assisting the patient and his brother with this request. [name] states that he has not received any current updates about new housing at this time.    Hyperlipidemia:  (Status: Goal on Track (progressing): YES.) Long Term Goal       Lab Results  Component Value Date    CHOL 142 09/10/2021    HDL 48 09/10/2021    LDLCALC 78 09/10/2021    TRIG 81 09/10/2021    CHOLHDL 2.5 10/11/2015      Medication review performed; medication list updated in electronic medical record. 01-02-2022: The patient takes Lipitor 80 mg QD and is compliant with medications.  Provider established  cholesterol goals reviewed. 01-02-2022: The patient is at goal; Counseled on importance of regular laboratory monitoring as prescribed. 01-02-2022:The patient has regular lab work Provided HLD Scientist, clinical (histocompatibility and immunogenetics); Reviewed role and benefits of statin for ASCVD risk reduction; Discussed strategies to manage statin-induced myalgias; Reviewed importance of limiting foods high in cholesterol;   Hypertension: (Status: Goal on Track (progressing): YES.) Long Term Goal  Last practice recorded BP readings:     BP Readings from Last 3 Encounters:  11/06/21 106/80  11/01/21 128/83  09/10/21 112/77  Most recent eGFR/CrCl:       Lab Results  Component Value Date    EGFR 78 09/10/2021    No components found for: CRCL   Evaluation of current treatment plan related to hypertension self management and patient's adherence to plan as established by provider. 01-02-2022: The patients brother states that he is stable and denies any issues with HTN or heart health.   Provided education to patient re: stroke prevention, s/s of heart attack and stroke; Reviewed prescribed diet heart healthy/ADA diet  Reviewed medications with patient and discussed importance of compliance. 01-02-2022: States compliance with heart healthy/ADA diet.;  Discussed plans with patient for ongoing care management follow up and  provided patient with direct contact information for care management team; Advised patient, providing education and rationale, to monitor blood pressure daily and record, calling PCP for findings outside established parameters;  Advised patient to discuss changes in blood pressure or heart health with provider; Provided education on prescribed diet heart healthy/ADA diet ;  Discussed complications of poorly controlled blood pressure such as heart disease, stroke, circulatory complications, vision complications, kidney impairment, sexual dysfunction;      History of CVA with brain hydrocephalus syndrome   (Status: Goal on Track (progressing): YES.) Long Term Goal  Evaluation of current treatment plan related to  History of CVA with brain hydrocephalus syndrome , Memory Deficits self-management and patient's adherence to plan as established by provider. 01-02-2022: The patients brother states that he is stable and denies any acute changes. He is sleeping well and will have upcoming appointments for follow up for chronic conditions. States that Espiridion was by himself when [name] was in the hospital but he did well. They have help coming into the home on a regular basis.  Discussed plans with patient for ongoing care management follow up and provided patient with direct contact information for care management team Advised patient to call the office for changes in mood, anxiety, depression, or residual effects from past CVA with brain syndrome. His brother lives with him and assist in his care. He has a peer support person that also aides in management of his care. The patient has what he needs and follows up accordingly with the specialist and pcp; Provided education to patient re: changes in neurologist health and well being and to call the office for questions of concerns; Reviewed medications with patient and discussed compliance the patient is compliant with medications; Provided patient with neurological changes, safety concerns with ambulation and fall prevention educational materials related to past history of stroke with brain syndrome; Discussed plans with patient for ongoing care management follow up and provided patient with direct contact information for care management team; Screening for signs and symptoms of depression related to chronic disease state;  Assessed social determinant of health barriers;  The patient is looking into housing with  the [facility]. They have filled out paperwork and are waiting to hear back from the process. [name] is assisting the patient and his  brother with the paperwork for this. Will continue to monitor.      Our next appointment is by telephone on 02-20-2022 at 4 pm  Please call the care guide team at [phone] if you need to cancel or reschedule your appointment.   If you are experiencing a Mental Health or [place] or need someone to talk to, please call the Suicide and Crisis Lifeline: 988 call the [place] National Suicide Prevention Lifeline: 662-170-0255 or TTY: 614 027 4173 TTY 623-064-0752) to talk to a trained counselor call 1-800-273-TALK (toll free, 24 hour hotline)   Patient verbalizes understanding of instructions and care plan provided today and agrees to view in [place]. Active MyChart status and patient understanding of how to access instructions and care plan via MyChart confirmed with patient.     Noreene Larsson RN, MSN, [facility] Mobile: [phone]

## 2022-01-04 DIAGNOSIS — E1159 Type 2 diabetes mellitus with other circulatory complications: Secondary | ICD-10-CM | POA: Diagnosis not present

## 2022-01-04 DIAGNOSIS — E785 Hyperlipidemia, unspecified: Secondary | ICD-10-CM

## 2022-01-04 DIAGNOSIS — I10 Essential (primary) hypertension: Secondary | ICD-10-CM | POA: Diagnosis not present

## 2022-01-14 ENCOUNTER — Telehealth: Payer: Self-pay

## 2022-01-14 ENCOUNTER — Encounter: Payer: Self-pay | Admitting: Nurse Practitioner

## 2022-01-14 DIAGNOSIS — Z8673 Personal history of transient ischemic attack (TIA), and cerebral infarction without residual deficits: Secondary | ICD-10-CM

## 2022-01-14 DIAGNOSIS — G918 Other hydrocephalus: Secondary | ICD-10-CM

## 2022-01-14 DIAGNOSIS — Z7409 Other reduced mobility: Secondary | ICD-10-CM

## 2022-01-14 NOTE — Telephone Encounter (Signed)
DME order for hospital bed placed and printed in and placed in provider's folder for signature.

## 2022-01-23 NOTE — Telephone Encounter (Signed)
Designer, [employer]) has called and states this pt has Medicare and Medicaid and is not eligible for a bed but every 5 years. His last bed was 01/29/2019. He will be eligible 01/29/24. Any questions call [phone]

## 2022-01-23 NOTE — Telephone Encounter (Signed)
Spoke with patient and notified patient of information received in regards to hospital bed. Patient verbalized understanding has no further questions at this time.

## 2022-01-24 ENCOUNTER — Emergency Department: Payer: Medicare Other

## 2022-01-24 ENCOUNTER — Other Ambulatory Visit: Payer: Self-pay

## 2022-01-24 ENCOUNTER — Encounter: Payer: Self-pay | Admitting: Emergency Medicine

## 2022-01-24 ENCOUNTER — Emergency Department
Admission: EM | Admit: 2022-01-24 | Discharge: 2022-01-24 | Disposition: A | Discharge status: Home / Self Care | Payer: Medicare Other | Attending: Emergency Medicine | Admitting: Emergency Medicine

## 2022-01-24 DIAGNOSIS — W19XXXA Unspecified fall, initial encounter: Secondary | ICD-10-CM

## 2022-01-24 DIAGNOSIS — Y92002 Bathroom of unspecified non-institutional (private) residence single-family (private) house as the place of occurrence of the external cause: Secondary | ICD-10-CM | POA: Insufficient documentation

## 2022-01-24 DIAGNOSIS — R531 Weakness: Secondary | ICD-10-CM | POA: Insufficient documentation

## 2022-01-24 DIAGNOSIS — W1839XA Other fall on same level, initial encounter: Secondary | ICD-10-CM | POA: Diagnosis not present

## 2022-01-24 DIAGNOSIS — M79606 Pain in leg, unspecified: Secondary | ICD-10-CM | POA: Diagnosis not present

## 2022-01-24 LAB — COMPREHENSIVE METABOLIC PANEL
ALT: 12 U/L (ref 0–44)
AST: 17 U/L (ref 15–41)
Albumin: 3.9 g/dL (ref 3.5–5.0)
Alkaline Phosphatase: 74 U/L (ref 38–126)
Anion gap: 12 (ref 5–15)
BUN: 13 mg/dL (ref 6–20)
CO2: 21 mmol/L — ABNORMAL LOW (ref 22–32)
Calcium: 8.8 mg/dL — ABNORMAL LOW (ref 8.9–10.3)
Chloride: 104 mmol/L (ref 98–111)
Creatinine, Ser: 1.17 mg/dL (ref 0.61–1.24)
GFR, Estimated: >60 mL/min (ref >60)
Glucose, Bld: 225 mg/dL — ABNORMAL HIGH (ref 70–99)
Potassium: 4.1 mmol/L (ref 3.5–5.1)
Sodium: 137 mmol/L (ref 135–145)
Total Bilirubin: 1.4 mg/dL — ABNORMAL HIGH (ref 0.3–1.2)
Total Protein: 7.5 g/dL (ref 6.5–8.1)

## 2022-01-24 LAB — CBC WITH DIFFERENTIAL/PLATELET
Abs Immature Granulocytes: 0.01 10*3/uL (ref 0.00–0.07)
Basophils Absolute: 0 10*3/uL (ref 0.0–0.1)
Basophils Relative: 0 %
Eosinophils Absolute: 0.4 10*3/uL (ref 0.0–0.5)
Eosinophils Relative: 9 %
HCT: 40.5 % (ref 39.0–52.0)
Hemoglobin: 13.1 g/dL (ref 13.0–17.0)
Immature Granulocytes: 0 %
Lymphocytes Relative: 25 %
Lymphs Abs: 1 10*3/uL (ref 0.7–4.0)
MCH: 29.5 pg (ref 26.0–34.0)
MCHC: 32.3 g/dL (ref 30.0–36.0)
MCV: 91.2 fL (ref 80.0–100.0)
Monocytes Absolute: 0.5 10*3/uL (ref 0.1–1.0)
Monocytes Relative: 12 %
Neutro Abs: 2.2 10*3/uL (ref 1.7–7.7)
Neutrophils Relative %: 54 %
Platelets: 216 10*3/uL (ref 150–400)
RBC: 4.44 MIL/uL (ref 4.22–5.81)
RDW: 13.1 % (ref 11.5–15.5)
WBC: 4 10*3/uL (ref 4.0–10.5)
nRBC: 0 % (ref 0.0–0.2)

## 2022-01-24 LAB — TROPONIN I (HIGH SENSITIVITY)
Troponin I (High Sensitivity): 3 ng/L (ref <18)
Troponin I (High Sensitivity): 3 ng/L (ref <18)

## 2022-01-24 NOTE — ED Triage Notes (Signed)
Presents via EMS from home  s/p fall  Per EMS he has a hx of old CVA in past and has right sided weakness  Per pt he walks with walker He ambulates on side of foot   Callous noted to lateral aspect of foot

## 2022-01-24 NOTE — Discharge Instructions (Signed)
Follow-up with your regular doctor if not improved in 3 days.  Return if worsening.

## 2022-01-24 NOTE — ED Provider Notes (Signed)
[facility] Provider Note    Event Date/Time   First MD Initiated Contact with Patient 01/24/22 [id number]     (approximate)   History   Leg Pain and Fall   HPI  [name] is a 51 y.o. male with history of CVA and hydrocephalus presents emergency department with increased weakness and a fall.  Patient states he went to grab the door of the bathroom when his hand would not let go of the handle.  States finally let go and he walked back to his room and his legs gave way.  States everything has returned to normal at this time.  States occasionally his hand will do that on the door handle.  States his weakness is resolved at this time.  States he is concerned about fall and the weakness and his brother thought he should come to the emergency department.  He did arrive via EMS.  All symptoms started at 9:30 AM      Physical Exam   Triage Vital Signs: ED Triage Vitals  Enc Vitals Group     BP 01/24/22 0844 (!) 155/88     Pulse Rate 01/24/22 0844 73     Resp 01/24/22 0844 18     Temp 01/24/22 0844 98.2 F (36.8 C)     Temp Source 01/24/22 0844 Oral     SpO2 01/24/22 0844 98 %     Weight 01/24/22 0847 294 lb 1.5 oz (133.4 kg)     Height 01/24/22 0847 6' 1.5" (1.867 m)     Head Circumference --      Peak Flow --      Pain Score 01/24/22 0847 3     Pain Loc --      Pain Edu? --      Excl. in GC? --     Most recent vital signs: Vitals:   01/24/22 0844 01/24/22 1131  BP: (!) 155/88 (!) 150/87  Pulse: 73 65  Resp: 18 18  Temp: 98.2 F (36.8 C)   SpO2: 98% 98%     General: Awake, no distress.   CV:  Good peripheral perfusion. regular rate and  rhythm Resp:  Normal effort. Lungs seen and Abd:  No distention.   Other:  Patient does have a residual deficit from his previous CVA with right sided facial droop.  Patient's grips are equal bilaterally, 5/5 strength lower extremities   ED Results / Procedures / Treatments   Labs (all labs ordered are  listed, but only abnormal results are displayed) Labs Reviewed  COMPREHENSIVE METABOLIC PANEL - Abnormal; Notable for the following components:      Result Value   CO2 21 (*)    Glucose, Bld 225 (*)    Calcium 8.8 (*)    Total Bilirubin 1.4 (*)    All other components within normal limits  CBC WITH DIFFERENTIAL/PLATELET  TROPONIN I (HIGH SENSITIVITY)  TROPONIN I (HIGH SENSITIVITY)     EKG  EKG   RADIOLOGY CT of the head and C-spine    PROCEDURES:   Procedures   MEDICATIONS ORDERED IN ED: Medications - No data to display   IMPRESSION / MDM / ASSESSMENT AND PLAN / ED COURSE  I reviewed the triage vital signs and the nursing notes.                              Differential diagnosis includes, but is not limited to,  CVA TIA weakness syncope MI fracture sprain contusion  Patient's presentation is most consistent with acute presentation with potential threat to life or bodily function.   Since the patient's weakness and neurodeficit have returned to normal at this time will not call code stroke.  Dr. Alfred Levins in to see the patient.  Agrees that we should do CT of the head, C-spine and order lab work  CT of the head and C-spine independently reviewed and interpreted by me as being negative for any acute abnormality.  Radiologist comments on severe hydronephrosis which is unchanged from previous scans.  Labs are reassuring, troponin 1 and 2 are both normal.  Other labs are normal.  Feel that the patient is stable.  He is supposed to be in a wheelchair at times in the facility.  Feel that he should probably use this more often.  Do not see any need to x-ray any extremity since he is nontender to palpation.  Patient has full range of motion of his leg on exam.  He will be discharged home.      FINAL CLINICAL IMPRESSION(S) / ED DIAGNOSES   Final diagnoses:  Fall, initial encounter  Weakness     Rx / DC Orders   ED Discharge Orders     None        Note:  This  document was prepared using Dragon voice recognition software and may include unintentional dictation errors.    Faythe Ghee, PA-C 01/24/22 1147    Concha Se, MD 01/27/22 [id number]

## 2022-02-20 ENCOUNTER — Ambulatory Visit (INDEPENDENT_AMBULATORY_CARE_PROVIDER_SITE_OTHER): Payer: Medicare Other

## 2022-02-20 ENCOUNTER — Telehealth: Payer: Medicare Other

## 2022-02-20 DIAGNOSIS — E1159 Type 2 diabetes mellitus with other circulatory complications: Secondary | ICD-10-CM

## 2022-02-20 DIAGNOSIS — N3941 Urge incontinence: Secondary | ICD-10-CM

## 2022-02-20 DIAGNOSIS — G918 Other hydrocephalus: Secondary | ICD-10-CM

## 2022-02-20 DIAGNOSIS — I10 Essential (primary) hypertension: Secondary | ICD-10-CM

## 2022-02-20 DIAGNOSIS — Z8673 Personal history of transient ischemic attack (TIA), and cerebral infarction without residual deficits: Secondary | ICD-10-CM

## 2022-02-20 DIAGNOSIS — I152 Hypertension secondary to endocrine disorders: Secondary | ICD-10-CM

## 2022-02-20 DIAGNOSIS — E1169 Type 2 diabetes mellitus with other specified complication: Secondary | ICD-10-CM

## 2022-02-20 DIAGNOSIS — E1129 Type 2 diabetes mellitus with other diabetic kidney complication: Secondary | ICD-10-CM

## 2022-02-20 NOTE — Chronic Care Management (AMB) (Signed)
Chronic Care Management   CCM RN Visit Note  02/20/2022 Name: [name] MRN: [id number] DOB: [birth year]  Subjective: [name] is a 51 y.o. year old male who is a primary care patient of Cannady, Barbaraann Faster, NP. The care management team was consulted for assistance with disease management and care coordination needs.    Spoke to the patients brother, Andreas Sobolewski  for follow up visit in response to provider referral for case management and/or care coordination services.   Consent to Services:  The patient was given information about Chronic Care Management services, agreed to services, and gave verbal consent prior to initiation of services.  Please see initial visit note for detailed documentation.   Patient agreed to services and verbal consent obtained.   Assessment: Review of patient past medical history, allergies, medications, health status, including review of consultants reports, laboratory and other test data, was performed as part of comprehensive evaluation and provision of chronic care management services.   SDOH (Social Determinants of Health) assessments and interventions performed:    CCM Care Plan  No Known Allergies  Outpatient Encounter Medications as of 02/20/2022  Medication Sig   Accu-Chek Softclix Lancets lancets Use to check blood sugars 2-3 times daily with goals = <130 fasting in morning and <180 two hours after eating.  Bring blood sugar log to appointments.  Dx E11.42   acetaminophen (TYLENOL) 325 MG tablet Take 2 tablets (650 mg total) by mouth every 6 (six) hours as needed for mild pain.   amLODipine (NORVASC) 10 MG tablet TAKE 1 TABLET BY MOUTH ONCE DAILY   ASPIR-LOW 81 MG EC tablet TAKE 1 TABLET BY MOUTH ONCE DAILY   atorvastatin (LIPITOR) 80 MG tablet TAKE 1 TABLET BY MOUTH ONCE DAILY AT 6PM   Blood Glucose Monitoring Suppl (CONTOUR NEXT MONITOR) w/Device KIT Use to check blood sugar 3 times daily, fasting in morning with goal <130 and 2 hours after  meals with goal <180.  Bring blood sugar log to visits.  Dx. E11.42   Blood Pressure Monitoring (BLOOD PRESSURE MONITOR AUTOMAT) DEVI Use to monitor blood pressure once daily.   carvedilol (COREG) 6.25 MG tablet TAKE 1 TABLET BY MOUTH TWICE DAILY WITH A MEAL   citalopram (CELEXA) 10 MG tablet TAKE 1 TABLET BY MOUTH ONCE DAILY   clopidogrel (PLAVIX) 75 MG tablet TAKE 1 TABLET BY MOUTH ONCE EVERY MORNING WITH BREAKFAST   donepezil (ARICEPT) 5 MG tablet Take 1 tablet (5 mg total) by mouth daily.   gabapentin (NEURONTIN) 300 MG capsule TAKE 1 CAPSULE BY MOUTH 3 TIMES DAILY   glucose blood (CONTOUR NEXT TEST) test strip Use to check blood sugar 3 times daily, fasting in morning with goal <130 and 2 hours after meals with goal <180.  Bring blood sugar log to visits.  Dx. E11.42   lisinopril (ZESTRIL) 5 MG tablet TAKE 1 TABLET BY MOUTH ONCE DAILY   metFORMIN (GLUCOPHAGE-XR) 500 MG 24 hr tablet Take 2 tablets (1,000 mg total) by mouth daily with breakfast.   Multiple Vitamin (MULTIVITAMIN) tablet Take 1 tablet by mouth daily.   mupirocin cream (BACTROBAN) 2 % Apply 1 application topically 2 (two) times daily.   MYRBETRIQ 50 MG TB24 tablet Take 50 mg by mouth daily.   tamsulosin (FLOMAX) 0.4 MG CAPS capsule Take 0.4 mg by mouth daily.   Vibegron (GEMTESA) 75 MG TABS Take 75 mg by mouth daily.   vitamin B-12 (CYANOCOBALAMIN) 1000 MCG tablet Take 1 tablet (1,000 mcg total)  by mouth daily.   No facility-administered encounter medications on file as of 02/20/2022.    Patient Active Problem List   Diagnosis Date Noted   Pain due to onychomycosis of toenails of both feet 11/15/2021   Poor mobility 09/06/2020   Type 2 diabetes mellitus with proteinuria ([place]) 12/31/2019   Incontinence of feces 12/10/2019   Urge incontinence of urine 12/10/2019   Chronic communicating hydrocephalus ([place]) 04/22/2019   Vitamin B12 deficiency 12/02/2018   Chronic pain of right ankle 12/18/2015   Obesity 10/16/2015    Hyperlipidemia associated with type 2 diabetes mellitus ([place]) 09/13/2015   History of stroke 12/17/2013   Hypertension associated with diabetes ([place]) 12/17/2013   Cardiomyopathy ([place]) 12/17/2013   Type 2 diabetes mellitus with diabetic polyneuropathy ([place]) 12/17/2013    Conditions to be addressed/monitored:HTN, HLD, DMII, and urinary incontinence with history of CVA with brain syndrome  Care Plan : RNCM: General Plan of Care (Adult) for Chronic Disease Management and Care Coordination Needs  Updates made by Vanita Ingles, RN since 02/20/2022 12:00 AM  Completed 02/20/2022   Problem: RNCM: Development of Plan of care for Chronic Disease Management (HTN, HLD, DM, Urinary Incontinence, history of CVA with brain syndrome) Resolved 02/20/2022  Priority: High     Long-Range Goal: RNCM: Effective Management of Plan of care for Chronic Disease Management (HTN, HLD, DM, Urinary Incontinence, history of CVA with brain syndrome) Completed 02/20/2022  Start Date: 08/15/2021  Expected End Date: 08/15/2022  Priority: High  Note:   Current Barriers: 02-20-2022: The patient has met the goals of care. The patients brother assist with his care. He states the patient is stable and denies any acute changes. Did have a fall on 01-24-2022 and was seen in the ED. Has not had any new falls. An advocate has called and ask for assistance with a new hospital bed. Per the patients brother they are working on this. Education and support given. The brother could not provide much information. States he has been checking his blood sugars but could not provide information on his numbers. Has not been checking blood pressures. This has been an ongoing issue related to the patients brother helping with compliance. Review and education provided. Reminded the patients brother to make sure the patient went to all of his appointments and to follow up also with the pcp. Verbalized understanding.  Knowledge Deficits related to plan of care  for management of HTN, HLD, DMII, and Urinary incontinence, and history of CVA with brain hydrocephalus syndrome   Care Coordination needs related to Level of care concerns and Mental Health Concerns   Chronic Disease Management support and education needs related to HTN, HLD, DMII, and urinary incontinence, history of CVA with brain hydrocephalus syndrome Lacks caregiver support.        Cognitive Deficits  RNCM Clinical Goal(s):  Patient will verbalize understanding of plan for management of HTN, HLD, DMII, and Urinary Incontinence and history of brain hydrocephalus syndrome as evidenced by keeping appointments, stable conditions, calling the office for questions or concerns and working with the CCM team to optimize health and well being  take all medications exactly as prescribed and will call provider for medication related questions as evidenced by compliance with medications and calling for refills before running out of medications    attend all scheduled medical appointments: keep appointments with pcp  as evidenced by compliance with routine office visits and calling  the office for schedule change needs  demonstrate improved and ongoing adherence to prescribed treatment plan for HTN, HLD, DMII, and urinary incontinence, and  history of stroke with brain hydrocephalus  as evidenced by VS stable, lab work stable, no exacerbations of chronic conditions, and patient able to maintain health and well being continue to work with Consulting civil engineer and/or Social Worker to address care management and care coordination needs related to HTN, HLD, DMII, and urinary incontinence and history of stroke with brain syndrome as evidenced by adherence to CM Team Scheduled appointments     demonstrate ongoing self health care management ability effective management of chronic conditions as evidenced by working with the CCM team  through collaboration with Consulting civil engineer, provider, and care team.    Interventions: 1:1 collaboration with primary care provider regarding development and update of comprehensive plan of care as evidenced by provider attestation and co-signature Inter-disciplinary care team collaboration (see longitudinal plan of care) Evaluation of current treatment plan related to  self management and patient's adherence to plan as established by provider   Diabetes:  (Status: Goal Met.) Long Term Goal   Lab Results  Component Value Date   HGBA1C 7.6 (H) 09/10/2021    Assessed patient's understanding of A1c goal: <7%. 10-10-2021: The patient with elevated A1C from 5.5 yesterday. Education given to the patients brother. 01-02-2022: The patients brother states he has been doing well with management of his DM.  Provided education to patient about basic DM disease process; Reviewed medications with patient and discussed importance of medication adherence. 10-10-2021; States compliance with medications. Has increased Metformin without any difficulty. 01-02-2022: States compliance with his medications       Reviewed prescribed diet with patient heart healthy/ADA diet. 01-02-2022: The brother denies any issues with appetite or following of dietary restrictions ; Counseled on importance of regular laboratory monitoring as prescribed. 01-02-2022: Has regular lab testing;        Discussed plans with patient for ongoing care management follow up and provided patient with direct contact information for care management team;      Provided patient with written educational materials related to hypo and hyperglycemia and importance of correct treatment. 01-02-2022: Review of hypo and hyperglycemia. The patients brother assist with his care and denies any issues with his DM management ;       Advised patient, providing education and rationale, to check cbg twice daily, when you have symptoms of low or high blood sugar, and before and after exercise and record. 08-15-2021: Per the patients brother his  blood sugars are ranging from 80-150's. Denies any issues with DM management. 10-10-2021: The patients brother states he has not been taking because he ran out of strips for the meter and he will have to pay for additional strips. Will ask the pharm D to reach out to the patient and assist with this expressed need.   01-02-2022: The patients brother states his blood sugars have been stable. He says he has been having his own health issues and he could not provide any numbers or the [facility] call provider for findings outside established parameters;       Review of patient status, including review of consultants reports, relevant laboratory and other test results, and medications completed;       Screening for signs and symptoms of depression related to chronic disease state;        Assessed social determinant of health barriers;  Last vision test in December 2022 per the patients brother  Will have a diabetic foot exam on 10-25-2021. 01-02-2022: Had exam with podiatrist and has seen the specialist for diabetic shoes. Is waiting for paperwork and approval. Has been fitted  Urinary Incontinence   (Status: Goal Met.) Long Term Goal  Evaluation of current treatment plan related to  Urinary incontinence , Limited social support, Level of care concerns, Mental Health Concerns , and Memory Deficits self-management and patient's adherence to plan as established by provider. 01-02-2022: The patient saw the specialist on 11-01-2021 and received cardiac clearance for PTNS on 11-06-2021. The patients brother [name], who assist in his care, had a stroke and was in the hospital for 2 weeks, he is just getting home and settled and is doing better. He states that Jaqwon missed a lot of his appointments because he was in the hospital but he is trying to get everything back on track with Randall Hiss and himself and will see [name] soon. He states that his urinary incontinence is about the same. Education and support given. Discussed plans  with patient for ongoing care management follow up and provided patient with direct contact information for care management team Advised patient to call the specialist for changes in urinary health and well being, changes in urinary habits or worsening sx and sx of incontinence. 01-02-2022: The patients brother states the patient is still having issues with urinary incontinence but it is not worse. He sees the specialist on a regular basis.  ; Provided education to patient re: routine follow up visits with the provider, monitoring for sx and sx of infection, especially UTI's. Calling the office for changes in urinary health and well being; Reviewed medications with patient and discussed compliance. The patient is taking Myrbetriq 50 mg and the patients brother states since he is taking this he is doing a lot better with not having "accidents". States the medication is working well. 01-02-2022: No changes with medications and the patient is stable.  Provided patient with urinary health and wellness  educational materials related to urinary incontinence ; Discussed plans with patient for ongoing care management follow up and provided patient with direct contact information for care management team; Advised patient to discuss changes in urinary habits, questions, or concerns with provider; Review of housing situation. The patient is trying to get housing through [facility]. Peer support person [name] is assisting the patient and his brother with this request. [name] states that he has not received any current updates about new housing at this time.   Hyperlipidemia:  (Status: Goal Met.) Long Term Goal  Lab Results  Component Value Date   CHOL 142 09/10/2021   HDL 48 09/10/2021   LDLCALC 78 09/10/2021   TRIG 81 09/10/2021   CHOLHDL 2.5 10/11/2015     Medication review performed; medication list updated in electronic medical record. 01-02-2022: The patient takes Lipitor 80 mg QD and  is compliant with medications.  Provider established cholesterol goals reviewed. 01-02-2022: The patient is at goal; Counseled on importance of regular laboratory monitoring as prescribed. 01-02-2022:The patient has regular lab work Provided HLD Scientist, clinical (histocompatibility and immunogenetics); Reviewed role and benefits of statin for ASCVD risk reduction; Discussed strategies to manage statin-induced myalgias; Reviewed importance of limiting foods high in cholesterol;  Hypertension: (Status: Goal Met.) Long Term Goal  Last practice recorded BP readings:  BP Readings from Last 3 Encounters:  11/06/21 106/80  11/01/21 128/83  09/10/21 112/77  Most recent eGFR/CrCl:  Lab Results  Component Value Date   EGFR 78 09/10/2021  No components found for: CRCL  Evaluation of current treatment plan related to hypertension self management and patient's adherence to plan as established by provider. 01-02-2022: The patients brother states that he is stable and denies any issues with HTN or heart health.   Provided education to patient re: stroke prevention, s/s of heart attack and stroke; Reviewed prescribed diet heart healthy/ADA diet  Reviewed medications with patient and discussed importance of compliance. 01-02-2022: States compliance with heart healthy/ADA diet.;  Discussed plans with patient for ongoing care management follow up and provided patient with direct contact information for care management team; Advised patient, providing education and rationale, to monitor blood pressure daily and record, calling PCP for findings outside established parameters;  Advised patient to discuss changes in blood pressure or heart health with provider; Provided education on prescribed diet heart healthy/ADA diet ;  Discussed complications of poorly controlled blood pressure such as heart disease, stroke, circulatory complications, vision complications, kidney impairment, sexual dysfunction;    History of CVA with brain hydrocephalus  syndrome  (Status: Goal Met.) Long Term Goal  Evaluation of current treatment plan related to  History of CVA with brain hydrocephalus syndrome , Memory Deficits self-management and patient's adherence to plan as established by provider. 01-02-2022: The patients brother states that he is stable and denies any acute changes. He is sleeping well and will have upcoming appointments for follow up for chronic conditions. States that Tyland was by himself when [name] was in the hospital but he did well. They have help coming into the home on a regular basis.  Discussed plans with patient for ongoing care management follow up and provided patient with direct contact information for care management team Advised patient to call the office for changes in mood, anxiety, depression, or residual effects from past CVA with brain syndrome. His brother lives with him and assist in his care. He has a peer support person that also aides in management of his care. The patient has what he needs and follows up accordingly with the specialist and pcp; Provided education to patient re: changes in neurologist health and well being and to call the office for questions of concerns; Reviewed medications with patient and discussed compliance the patient is compliant with medications; Provided patient with neurological changes, safety concerns with ambulation and fall prevention educational materials related to past history of stroke with brain syndrome; Discussed plans with patient for ongoing care management follow up and provided patient with direct contact information for care management team; Screening for signs and symptoms of depression related to chronic disease state;  Assessed social determinant of health barriers;  The patient is looking into housing with  the [facility]. They have filled out paperwork and are waiting to hear back from the process. [name] is assisting the patient and his brother with the  paperwork for this. Will continue to monitor.    Patient Goals/Self-Care Activities: Take medications as prescribed   Attend all scheduled provider appointments Call pharmacy for medication refills 3-7 days in advance of running out of medications Attend church or other social activities Perform all self care activities independently  Perform IADL's (shopping, preparing meals, housekeeping, managing finances) independently Call provider office for new concerns or questions  Work with the social worker to address care coordination needs and will continue to work with the clinical team to address health care and disease management related needs call the Suicide and Crisis Lifeline: 988 call the [place] National Suicide Prevention Lifeline: (848) 265-8678  or TTY: 916-794-2635 TTY 306-666-6256) to talk to a trained counselor call 1-800-273-TALK (toll free, 24 hour hotline) if experiencing a Mental Health or [place]  keep appointment with eye doctor check blood sugar at prescribed times: twice daily, when you have symptoms of low or high blood sugar, and before and after exercise check feet daily for cuts, sores or redness enter blood sugar readings and medication or insulin into daily log take the blood sugar log to all doctor visits trim toenails straight across. 08-15-2021: Saw the podiatrist recently and had nails trimmed. Will go back in 3 months. The patient will also be evaluated for diabetic shoes.  drink 6 to 8 glasses of water each day eat fish at least once per week fill half of plate with vegetables join a weight loss program limit fast food meals to no more than 1 per week manage portion size prepare main meal at home 3 to 5 days each week read food labels for fat, fiber, carbohydrates and portion size reduce red meat to 2 to 3 times a week switch to sugar-free drinks do heel pump exercise 2 to 3 times each day keep feet up while sitting wash and dry feet  carefully every day wear comfortable, cotton socks wear comfortable, well-fitting shoes check blood pressure 3 times per week choose a place to take my blood pressure (home, clinic or office, retail store) write blood pressure results in a log or diary learn about high blood pressure keep a blood pressure log take blood pressure log to all doctor appointments call doctor for signs and symptoms of high blood pressure develop an action plan for high blood pressure keep all doctor appointments take medications for blood pressure exactly as prescribed report new symptoms to your doctor eat more whole grains, fruits and vegetables, lean meats and healthy fats - call for medicine refill 2 or 3 days before it runs out - take all medications exactly as prescribed - call doctor with any symptoms you believe are related to your medicine - call doctor when you experience any new symptoms - go to all doctor appointments as scheduled - adhere to prescribed diet: heart healthy/ADA diet       Plan:No further follow up required: the patient has met the goals of care.    Noreene Larsson RN, MSN, [facility] Mobile: [phone]

## 2022-02-20 NOTE — Patient Instructions (Signed)
Visit Information  Thank you for taking time to visit with me today. Please don't hesitate to contact me if I can be of assistance to you before our next scheduled telephone appointment.  Following are the goals we discussed today:  Current Barriers: 02-20-2022: The patient has met the goals of care. The patients brother assist with his care. He states the patient is stable and denies any acute changes. Did have a fall on 01-24-2022 and was seen in the ED. Has not had any new falls. An advocate has called and ask for assistance with a new hospital bed. Per the patients brother they are working on this. Education and support given. The brother could not provide much information. States he has been checking his blood sugars but could not provide information on his numbers. Has not been checking blood pressures. This has been an ongoing issue related to the patients brother helping with compliance. Review and education provided. Reminded the patients brother to make sure the patient went to all of his appointments and to follow up also with the pcp. Verbalized understanding.  Knowledge Deficits related to plan of care for management of HTN, HLD, DMII, and Urinary incontinence, and history of CVA with brain hydrocephalus syndrome   Care Coordination needs related to Level of care concerns and Mental Health Concerns   Chronic Disease Management support and education needs related to HTN, HLD, DMII, and urinary incontinence, history of CVA with brain hydrocephalus syndrome Lacks caregiver support.        Cognitive Deficits   RNCM Clinical Goal(s):  Patient will verbalize understanding of plan for management of HTN, HLD, DMII, and Urinary Incontinence and history of brain hydrocephalus syndrome as evidenced by keeping appointments, stable conditions, calling the office for questions or concerns and working with the CCM team to optimize health and well being  take all medications exactly as prescribed and will  call provider for medication related questions as evidenced by compliance with medications and calling for refills before running out of medications    attend all scheduled medical appointments: keep appointments with pcp  as evidenced by compliance with routine office visits and calling  the office for schedule change needs         demonstrate improved and ongoing adherence to prescribed treatment plan for HTN, HLD, DMII, and urinary incontinence, and  history of stroke with brain hydrocephalus  as evidenced by VS stable, lab work stable, no exacerbations of chronic conditions, and patient able to maintain health and well being continue to work with Consulting civil engineer and/or Social Worker to address care management and care coordination needs related to HTN, HLD, DMII, and urinary incontinence and history of stroke with brain syndrome as evidenced by adherence to CM Team Scheduled appointments     demonstrate ongoing self health care management ability effective management of chronic conditions as evidenced by working with the CCM team  through collaboration with Consulting civil engineer, provider, and care team.    Interventions: 1:1 collaboration with primary care provider regarding development and update of comprehensive plan of care as evidenced by provider attestation and co-signature Inter-disciplinary care team collaboration (see longitudinal plan of care) Evaluation of current treatment plan related to  self management and patient's adherence to plan as established by provider     Diabetes:  (Status: Goal Met.) Long Term Goal         Lab Results  Component Value Date    HGBA1C 7.6 (H) 09/10/2021    Assessed  patient's understanding of A1c goal: <7%. 10-10-2021: The patient with elevated A1C from 5.5 yesterday. Education given to the patients brother. 01-02-2022: The patients brother states he has been doing well with management of his DM.  Provided education to patient about basic DM disease  process; Reviewed medications with patient and discussed importance of medication adherence. 10-10-2021; States compliance with medications. Has increased Metformin without any difficulty. 01-02-2022: States compliance with his medications       Reviewed prescribed diet with patient heart healthy/ADA diet. 01-02-2022: The brother denies any issues with appetite or following of dietary restrictions ; Counseled on importance of regular laboratory monitoring as prescribed. 01-02-2022: Has regular lab testing;        Discussed plans with patient for ongoing care management follow up and provided patient with direct contact information for care management team;      Provided patient with written educational materials related to hypo and hyperglycemia and importance of correct treatment. 01-02-2022: Review of hypo and hyperglycemia. The patients brother assist with his care and denies any issues with his DM management ;       Advised patient, providing education and rationale, to check cbg twice daily, when you have symptoms of low or high blood sugar, and before and after exercise and record. 08-15-2021: Per the patients brother his blood sugars are ranging from 80-150's. Denies any issues with DM management. 10-10-2021: The patients brother states he has not been taking because he ran out of strips for the meter and he will have to pay for additional strips. Will ask the pharm D to reach out to the patient and assist with this expressed need.   01-02-2022: The patients brother states his blood sugars have been stable. He says he has been having his own health issues and he could not provide any numbers or the [facility] call provider for findings outside established parameters;       Review of patient status, including review of consultants reports, relevant laboratory and other test results, and medications completed;       Screening for signs and symptoms of depression related to chronic disease state;        Assessed social  determinant of health barriers;  Last vision test in December 2022 per the patients brother      Will have a diabetic foot exam on 10-25-2021. 01-02-2022: Had exam with podiatrist and has seen the specialist for diabetic shoes. Is waiting for paperwork and approval. Has been fitted   Urinary Incontinence   (Status: Goal Met.) Long Term Goal  Evaluation of current treatment plan related to  Urinary incontinence , Limited social support, Level of care concerns, Mental Health Concerns , and Memory Deficits self-management and patient's adherence to plan as established by provider. 01-02-2022: The patient saw the specialist on 11-01-2021 and received cardiac clearance for PTNS on 11-06-2021. The patients brother [name], who assist in his care, had a stroke and was in the hospital for 2 weeks, he is just getting home and settled and is doing better. He states that Quaid missed a lot of his appointments because he was in the hospital but he is trying to get everything back on track with Randall Hiss and himself and will see [name] soon. He states that his urinary incontinence is about the same. Education and support given. Discussed plans with patient for ongoing care management follow up and provided patient with direct contact information for care management team Advised patient to call the specialist for changes in  urinary health and well being, changes in urinary habits or worsening sx and sx of incontinence. 01-02-2022: The patients brother states the patient is still having issues with urinary incontinence but it is not worse. He sees the specialist on a regular basis.  ; Provided education to patient re: routine follow up visits with the provider, monitoring for sx and sx of infection, especially UTI's. Calling the office for changes in urinary health and well being; Reviewed medications with patient and discussed compliance. The patient is taking Myrbetriq 50 mg and the patients brother states since he is taking this he  is doing a lot better with not having "accidents". States the medication is working well. 01-02-2022: No changes with medications and the patient is stable.  Provided patient with urinary health and wellness  educational materials related to urinary incontinence ; Discussed plans with patient for ongoing care management follow up and provided patient with direct contact information for care management team; Advised patient to discuss changes in urinary habits, questions, or concerns with provider; Review of housing situation. The patient is trying to get housing through [facility]. Peer support person [name] is assisting the patient and his brother with this request. [name] states that he has not received any current updates about new housing at this time.    Hyperlipidemia:  (Status: Goal Met.) Long Term Goal       Lab Results  Component Value Date    CHOL 142 09/10/2021    HDL 48 09/10/2021    LDLCALC 78 09/10/2021    TRIG 81 09/10/2021    CHOLHDL 2.5 10/11/2015      Medication review performed; medication list updated in electronic medical record. 01-02-2022: The patient takes Lipitor 80 mg QD and is compliant with medications.  Provider established cholesterol goals reviewed. 01-02-2022: The patient is at goal; Counseled on importance of regular laboratory monitoring as prescribed. 01-02-2022:The patient has regular lab work Provided HLD Scientist, clinical (histocompatibility and immunogenetics); Reviewed role and benefits of statin for ASCVD risk reduction; Discussed strategies to manage statin-induced myalgias; Reviewed importance of limiting foods high in cholesterol;   Hypertension: (Status: Goal Met.) Long Term Goal  Last practice recorded BP readings:     BP Readings from Last 3 Encounters:  11/06/21 106/80  11/01/21 128/83  09/10/21 112/77  Most recent eGFR/CrCl:       Lab Results  Component Value Date    EGFR 78 09/10/2021    No components found for: CRCL   Evaluation of current  treatment plan related to hypertension self management and patient's adherence to plan as established by provider. 01-02-2022: The patients brother states that he is stable and denies any issues with HTN or heart health.   Provided education to patient re: stroke prevention, s/s of heart attack and stroke; Reviewed prescribed diet heart healthy/ADA diet  Reviewed medications with patient and discussed importance of compliance. 01-02-2022: States compliance with heart healthy/ADA diet.;  Discussed plans with patient for ongoing care management follow up and provided patient with direct contact information for care management team; Advised patient, providing education and rationale, to monitor blood pressure daily and record, calling PCP for findings outside established parameters;  Advised patient to discuss changes in blood pressure or heart health with provider; Provided education on prescribed diet heart healthy/ADA diet ;  Discussed complications of poorly controlled blood pressure such as heart disease, stroke, circulatory complications, vision complications, kidney impairment, sexual dysfunction;      History of CVA with brain hydrocephalus syndrome  (  Status: Goal Met.) Long Term Goal  Evaluation of current treatment plan related to  History of CVA with brain hydrocephalus syndrome , Memory Deficits self-management and patient's adherence to plan as established by provider. 01-02-2022: The patients brother states that he is stable and denies any acute changes. He is sleeping well and will have upcoming appointments for follow up for chronic conditions. States that Shamon was by himself when [name] was in the hospital but he did well. They have help coming into the home on a regular basis.  Discussed plans with patient for ongoing care management follow up and provided patient with direct contact information for care management team Advised patient to call the office for changes in mood, anxiety,  depression, or residual effects from past CVA with brain syndrome. His brother lives with him and assist in his care. He has a peer support person that also aides in management of his care. The patient has what he needs and follows up accordingly with the specialist and pcp; Provided education to patient re: changes in neurologist health and well being and to call the office for questions of concerns; Reviewed medications with patient and discussed compliance the patient is compliant with medications; Provided patient with neurological changes, safety concerns with ambulation and fall prevention educational materials related to past history of stroke with brain syndrome; Discussed plans with patient for ongoing care management follow up and provided patient with direct contact information for care management team; Screening for signs and symptoms of depression related to chronic disease state;  Assessed social determinant of health barriers;  The patient is looking into housing with  the [facility]. They have filled out paperwork and are waiting to hear back from the process. [name] is assisting the patient and his brother with the paperwork for this. Will continue to monitor.     Patient Goals/Self-Care Activities: Take medications as prescribed   Attend all scheduled provider appointments Call pharmacy for medication refills 3-7 days in advance of running out of medications Attend church or other social activities Perform all self care activities independently  Perform IADL's (shopping, preparing meals, housekeeping, managing finances) independently Call provider office for new concerns or questions  Work with the social worker to address care coordination needs and will continue to work with the clinical team to address health care and disease management related needs call the Suicide and Crisis Lifeline: 988 call the [place] National Suicide Prevention Lifeline:  978-514-7152 or TTY: (301)877-3327 TTY (734) 416-3118) to talk to a trained counselor call 1-800-273-TALK (toll free, 24 hour hotline) if experiencing a Mental Health or [place]  keep appointment with eye doctor check blood sugar at prescribed times: twice daily, when you have symptoms of low or high blood sugar, and before and after exercise check feet daily for cuts, sores or redness enter blood sugar readings and medication or insulin into daily log take the blood sugar log to all doctor visits trim toenails straight across. 08-15-2021: Saw the podiatrist recently and had nails trimmed. Will go back in 3 months. The patient will also be evaluated for diabetic shoes.  drink 6 to 8 glasses of water each day eat fish at least once per week fill half of plate with vegetables join a weight loss program limit fast food meals to no more than 1 per week manage portion size prepare main meal at home 3 to 5 days each week read food labels for fat, fiber, carbohydrates and portion size reduce red  meat to 2 to 3 times a week switch to sugar-free drinks do heel pump exercise 2 to 3 times each day keep feet up while sitting wash and dry feet carefully every day wear comfortable, cotton socks wear comfortable, well-fitting shoes check blood pressure 3 times per week choose a place to take my blood pressure (home, clinic or office, retail store) write blood pressure results in a log or diary learn about high blood pressure keep a blood pressure log take blood pressure log to all doctor appointments call doctor for signs and symptoms of high blood pressure develop an action plan for high blood pressure keep all doctor appointments take medications for blood pressure exactly as prescribed report new symptoms to your doctor eat more whole grains, fruits and vegetables, lean meats and healthy fats - call for medicine refill 2 or 3 days before it runs out - take all medications  exactly as prescribed - call doctor with any symptoms you believe are related to your medicine - call doctor when you experience any new symptoms - go to all doctor appointments as scheduled - adhere to prescribed diet: heart healthy/ADA diet            Please call the care guide team at [phone] if you need to schedule an appointment.   If you are experiencing a Mental Health or [place] or need someone to talk to, please call the Suicide and Crisis Lifeline: 988 call the [place] National Suicide Prevention Lifeline: 816-212-5942 or TTY: (360)397-0781 TTY 5193491970) to talk to a trained counselor call 1-800-273-TALK (toll free, 24 hour hotline)   Patient verbalizes understanding of instructions and care plan provided today and agrees to view in [place]. Active MyChart status and patient understanding of how to access instructions and care plan via MyChart confirmed with patient.     Noreene Larsson RN, MSN, [facility] Mobile: [phone]

## 2022-02-21 ENCOUNTER — Ambulatory Visit: Payer: Self-pay

## 2022-02-21 ENCOUNTER — Ambulatory Visit (INDEPENDENT_AMBULATORY_CARE_PROVIDER_SITE_OTHER): Payer: Medicare Other | Admitting: Podiatry

## 2022-02-21 ENCOUNTER — Encounter: Payer: Self-pay | Admitting: Podiatry

## 2022-02-21 DIAGNOSIS — B351 Tinea unguium: Secondary | ICD-10-CM

## 2022-02-21 DIAGNOSIS — E1142 Type 2 diabetes mellitus with diabetic polyneuropathy: Secondary | ICD-10-CM | POA: Diagnosis not present

## 2022-02-21 DIAGNOSIS — M79675 Pain in left toe(s): Secondary | ICD-10-CM | POA: Diagnosis not present

## 2022-02-21 DIAGNOSIS — M79674 Pain in right toe(s): Secondary | ICD-10-CM | POA: Diagnosis not present

## 2022-02-21 DIAGNOSIS — Z8673 Personal history of transient ischemic attack (TIA), and cerebral infarction without residual deficits: Secondary | ICD-10-CM

## 2022-02-21 NOTE — Patient Instructions (Signed)
Visit Information  Thank you for taking time to visit with me today. Please don't hesitate to contact me if I can be of assistance to you.   Following are the goals we discussed today:   Goals Addressed             This Visit's Progress    COMPLETED: RNCM: Assistance with appointment and where the appointment upcoming for the patient is for next week       Care Coordination Interventions: Advised patient to call the office for changes, concerns or new needs. The patients brother [name] ask about help with finding out where the appointment for the patient is for 02-26-2022. Provided information Provided education to patient re: Advised the patient has an appointment at [facility] on 02-26-2022 at 1045 am Reviewed scheduled/upcoming provider appointments including 02-26-2022 at 1045 at [facility] Advised patient to discuss changes in urinary health, questions or concerns  with provider Screening for signs and symptoms of depression related to chronic disease state  Assessed social determinant of health barriers            Please call the care guide team at [phone] if you need to cancel or reschedule your appointment.   If you are experiencing a Mental Health or Behavioral Health Crisis or need someone to talk to, please call the Suicide and Crisis Lifeline: 988 call the [place] National Suicide Prevention Lifeline: 508 661 9786 or TTY: 3075135024 TTY 650 273 1078) to talk to a trained counselor call 1-800-273-TALK (toll free, 24 hour hotline)  Patient verbalizes understanding of instructions and care plan provided today and agrees to view in MyChart. Active MyChart status and patient understanding of how to access instructions and care plan via MyChart confirmed with patient.     No further follow up required: completed the call with the patients brother. Knows to call for changes or new needs  Alto Denver RN, MSN, CCM [facility] Coordinator [facility]  [facility] Mobile:  [phone]

## 2022-02-21 NOTE — Patient Outreach (Signed)
  Care Coordination   Follow Up Visit Note   02/21/2022 Name: [name] MRN: [id number] DOB: [birth year]  [name] is a 51 y.o. year old male who sees [place], [name] T, NP for primary care. I  spoke with the patients brother, [name] on an incoming call  What matters to the patients health and wellness today?  Making sure he knew where and what time the patients appointment was for next week    Goals Addressed             This Visit's Progress    COMPLETED: RNCM: Assistance with appointment and where the appointment upcoming for the patient is for next week       Care Coordination Interventions: Advised patient to call the office for changes, concerns or new needs. The patients brother [name] ask about help with finding out where the appointment for the patient is for 02-26-2022. Provided information Provided education to patient re: Advised the patient has an appointment at [facility] on 02-26-2022 at 1045 am Reviewed scheduled/upcoming provider appointments including 02-26-2022 at 1045 at [facility] Advised patient to discuss changes in urinary health, questions or concerns  with provider Screening for signs and symptoms of depression related to chronic disease state  Assessed social determinant of health barriers           SDOH assessments and interventions completed:  Yes     Care Coordination Interventions Activated:  Yes  Care Coordination Interventions:  Yes, provided   Follow up plan: No further intervention required.   Encounter Outcome:  Pt. Visit Completed   Alto Denver RN, MSN, CCM [facility] Coordinator [facility]  [facility] Mobile: [phone]

## 2022-02-21 NOTE — Progress Notes (Signed)
This patient returns to my office for at risk foot care.  This patient requires this care by a professional since this patient will be at risk due to having type 2 diabetes with neuropathy and coagulation defect due to plavix.  This patient is unable to cut nails himself since the patient cannot reach his nails.These nails are painful walking and wearing shoes.  Patient also requests diabetic shoes with a brace.  His brother made the request.This patient presents for at risk foot care today.  General Appearance  Alert, conversant and in no acute stress.  Vascular  Dorsalis pedis and posterior tibial  pulses are palpable  bilaterally.  Capillary return is within normal limits  bilaterally. Temperature is within normal limits  bilaterally.  Neurologic  Senn-Weinstein monofilament wire test diminished  bilaterally. Muscle power within normal limits bilaterally.  Nails Thick disfigured discolored nails with subungual debris  from hallux to fifth toes bilaterally. No evidence of bacterial infection or drainage bilaterally.  Orthopedic  No limitations of motion  feet .  No crepitus or effusions noted.  No bony pathology or digital deformities noted.  Skin  normotropic skin with no porokeratosis noted bilaterally.  No signs of infections or ulcers noted.     Onychomycosis  Pain in right toes  Pain in left toes  Consent was obtained for treatment procedures.   Mechanical debridement of nails 1-5  bilaterally performed with a nail nipper.  Filed with dremel without incident.    Return office visit   3 months                   Told patient to return for periodic foot care and evaluation due to potential at risk complications.   Helane Gunther DPM

## 2022-02-26 ENCOUNTER — Encounter: Payer: Self-pay | Admitting: Urology

## 2022-02-26 ENCOUNTER — Encounter: Payer: Self-pay | Admitting: Physician Assistant

## 2022-02-26 ENCOUNTER — Ambulatory Visit: Payer: Medicare Other | Admitting: Physician Assistant

## 2022-03-01 ENCOUNTER — Other Ambulatory Visit: Payer: Medicare Other

## 2022-03-07 DIAGNOSIS — E785 Hyperlipidemia, unspecified: Secondary | ICD-10-CM

## 2022-03-07 DIAGNOSIS — E1159 Type 2 diabetes mellitus with other circulatory complications: Secondary | ICD-10-CM

## 2022-03-07 DIAGNOSIS — I10 Essential (primary) hypertension: Secondary | ICD-10-CM | POA: Diagnosis not present

## 2022-03-07 DIAGNOSIS — R809 Proteinuria, unspecified: Secondary | ICD-10-CM

## 2022-03-07 DIAGNOSIS — E1129 Type 2 diabetes mellitus with other diabetic kidney complication: Secondary | ICD-10-CM | POA: Diagnosis not present

## 2022-03-07 DIAGNOSIS — I152 Hypertension secondary to endocrine disorders: Secondary | ICD-10-CM

## 2022-03-07 DIAGNOSIS — E1169 Type 2 diabetes mellitus with other specified complication: Secondary | ICD-10-CM

## 2022-03-25 ENCOUNTER — Other Ambulatory Visit: Payer: Self-pay | Admitting: Nurse Practitioner

## 2022-03-26 NOTE — Telephone Encounter (Signed)
Requested Prescriptions  Pending Prescriptions Disp Refills  . amLODipine (NORVASC) 10 MG tablet [Pharmacy Med Name: AMLODIPINE BESYLATE 10 MG TAB] 90 tablet 0    Sig: TAKE 1 TABLET BY MOUTH ONCE DAILY     Cardiovascular: Calcium Channel Blockers 2 Failed - 03/25/2022  3:13 PM      Failed - Last BP in normal range    BP Readings from Last 1 Encounters:  01/24/22 (!) 148/80         Failed - Valid encounter within last 6 months    Recent Outpatient Visits          6 months ago Type 2 diabetes mellitus with proteinuria ([place])   [place], Saunemin T, NP   11 months ago Great toe pain, left   [facility] [place]e, Lauren A, NP   1 year ago Type 2 diabetes mellitus with diabetic polyneuropathy, without long-term current use of insulin ([place])   [place], Stockton T, NP   1 year ago Type 2 diabetes mellitus with proteinuria ([place])   [place], [name] T, NP   1 year ago Type 2 diabetes mellitus with proteinuria ([place])   [place]Laughlin, Mountainair T, NP      Future Appointments            In 1 week Cannady, Barbaraann Faster, NP [employer]           Passed - Last Heart Rate in normal range    Pulse Readings from Last 1 Encounters:  01/24/22 68         . lisinopril (ZESTRIL) 5 MG tablet [Pharmacy Med Name: LISINOPRIL 5 MG TAB] 90 tablet 0    Sig: TAKE 1 TABLET BY MOUTH ONCE DAILY     Cardiovascular:  ACE Inhibitors Failed - 03/25/2022  3:13 PM      Failed - Last BP in normal range    BP Readings from Last 1 Encounters:  01/24/22 (!) 148/80         Failed - Valid encounter within last 6 months    Recent Outpatient Visits          6 months ago Type 2 diabetes mellitus with proteinuria ([place])   [place], Pinal T, NP   11 months ago Great toe pain, left   [employer], Lauren A, NP   1 year ago Type 2 diabetes mellitus with diabetic  polyneuropathy, without long-term current use of insulin ([place])   [place], Tajique T, NP   1 year ago Type 2 diabetes mellitus with proteinuria ([place])   [place], [name] T, NP   1 year ago Type 2 diabetes mellitus with proteinuria ([place])   Melissa, Stockdale T, NP      Future Appointments            In 1 week Cannady, Barbaraann Faster, NP [employer]           Passed - Cr in normal range and within 180 days    Creatinine  Date Value Ref Range Status  07/07/2014 1.19 0.60 - 1.30 mg/dL Final   Creatinine, Ser  Date Value Ref Range Status  01/24/2022 1.17 0.61 - 1.24 mg/dL Final         Passed - K in normal range and within 180 days    Potassium  Date Value Ref Range Status  01/24/2022 4.1 3.5 - 5.1 mmol/L Final    Comment:    HEMOLYSIS AT THIS LEVEL MAY AFFECT RESULT  07/07/2014 3.7 3.5 - 5.1 mmol/L Final         Passed - Patient is not pregnant      . citalopram (CELEXA) 10 MG tablet [Pharmacy Med Name: CITALOPRAM HYDROBROMIDE 10 MG TAB] 90 tablet 0    Sig: TAKE 1 TABLET BY MOUTH ONCE DAILY     Psychiatry:  Antidepressants - SSRI Failed - 03/25/2022  3:13 PM      Failed - Valid encounter within last 6 months    Recent Outpatient Visits          6 months ago Type 2 diabetes mellitus with proteinuria (HCC)   [facility] [place], [place]ville T, NP   11 months ago Great toe pain, left   [employer], Lauren A, NP   1 year ago Type 2 diabetes mellitus with diabetic polyneuropathy, without long-term current use of insulin (HCC)   [facility] [place], Soudan T, NP   1 year ago Type 2 diabetes mellitus with proteinuria (HCC)   [facility] [place], [name] T, NP   1 year ago Type 2 diabetes mellitus with proteinuria (HCC)   [facility] [place], [name] T, NP      Future Appointments            In 1 week Cannady, Dorie Rank, NP  [facility]           . carvedilol (COREG) 6.25 MG tablet [Pharmacy Med Name: CARVEDILOL 6.25 MG TAB] 180 tablet 0    Sig: TAKE 1 TABLET BY MOUTH TWICE DAILY WITH A MEAL     Cardiovascular: Beta Blockers 3 Failed - 03/25/2022  3:13 PM      Failed - Last BP in normal range    BP Readings from Last 1 Encounters:  01/24/22 (!) 148/80         Failed - Valid encounter within last 6 months    Recent Outpatient Visits          6 months ago Type 2 diabetes mellitus with proteinuria (HCC)   [facility] [place], Descanso T, NP   11 months ago Great toe pain, left   [employer], Lauren A, NP   1 year ago Type 2 diabetes mellitus with diabetic polyneuropathy, without long-term current use of insulin (HCC)   [facility] [place], Eloy T, NP   1 year ago Type 2 diabetes mellitus with proteinuria (HCC)   [facility] [place], [name] T, NP   1 year ago Type 2 diabetes mellitus with proteinuria (HCC)   [facility] [place], Landess T, NP      Future Appointments            In 1 week Cannady, Dorie Rank, NP [facility]           Passed - Cr in normal range and within 360 days    Creatinine  Date Value Ref Range Status  07/07/2014 1.19 0.60 - 1.30 mg/dL Final   Creatinine, Ser  Date Value Ref Range Status  01/24/2022 1.17 0.61 - 1.24 mg/dL Final         Passed - AST in normal range and within 360 days    AST  Date Value Ref Range Status  01/24/2022 17 15 - 41 U/L Final   SGOT(AST)  Date Value  Ref Range Status  07/07/2014 10 (L) 15 - 37 Unit/L Final         Passed - ALT in normal range and within 360 days    ALT  Date Value Ref Range Status  01/24/2022 12 0 - 44 U/L Final   SGPT (ALT)  Date Value Ref Range Status  07/07/2014 15 U/L Final    Comment:    14-63 NOTE: New Reference Range 01/25/14          Passed - Last Heart Rate in normal range    Pulse Readings  from Last 1 Encounters:  01/24/22 68

## 2022-04-02 ENCOUNTER — Ambulatory Visit: Payer: Medicare Other | Admitting: Nurse Practitioner

## 2022-04-02 DIAGNOSIS — E1142 Type 2 diabetes mellitus with diabetic polyneuropathy: Secondary | ICD-10-CM

## 2022-04-02 DIAGNOSIS — Z8673 Personal history of transient ischemic attack (TIA), and cerebral infarction without residual deficits: Secondary | ICD-10-CM

## 2022-04-02 DIAGNOSIS — E1129 Type 2 diabetes mellitus with other diabetic kidney complication: Secondary | ICD-10-CM

## 2022-04-02 DIAGNOSIS — Z7409 Other reduced mobility: Secondary | ICD-10-CM

## 2022-04-02 DIAGNOSIS — E1169 Type 2 diabetes mellitus with other specified complication: Secondary | ICD-10-CM

## 2022-04-02 DIAGNOSIS — I429 Cardiomyopathy, unspecified: Secondary | ICD-10-CM

## 2022-04-02 DIAGNOSIS — E538 Deficiency of other specified B group vitamins: Secondary | ICD-10-CM

## 2022-04-02 DIAGNOSIS — I152 Hypertension secondary to endocrine disorders: Secondary | ICD-10-CM

## 2022-04-02 DIAGNOSIS — G910 Communicating hydrocephalus: Secondary | ICD-10-CM

## 2022-04-05 ENCOUNTER — Telehealth: Payer: Self-pay | Admitting: Nurse Practitioner

## 2022-04-05 NOTE — Telephone Encounter (Signed)
Copied from [place] [phone]. Topic: General - Other >> Apr 05, 2022 10:39 AM Everette C wrote: Reason for CRM: Reynolds Bowl with [facility] has called to request an updated FL2 form for the patient   The patient will be receiving housing with [facility] in [place]   Please fax an updated FL2 to [facility] at [phone] Attention Anderson Malta    Please contact further if needed

## 2022-04-05 NOTE — Patient Instructions (Signed)

## 2022-04-08 NOTE — Telephone Encounter (Signed)
FL2 and medication list has been completed and printed off and placed in provider's folder for signature.

## 2022-04-08 NOTE — Telephone Encounter (Signed)
Noted, will complete upon return to office.

## 2022-04-10 ENCOUNTER — Ambulatory Visit (INDEPENDENT_AMBULATORY_CARE_PROVIDER_SITE_OTHER): Payer: Medicare Other | Admitting: Nurse Practitioner

## 2022-04-10 ENCOUNTER — Encounter: Payer: Self-pay | Admitting: Nurse Practitioner

## 2022-04-10 VITALS — BP 104/69 | HR 70 | Temp 98.6°F | Wt 294.0 lb

## 2022-04-10 DIAGNOSIS — E1142 Type 2 diabetes mellitus with diabetic polyneuropathy: Secondary | ICD-10-CM

## 2022-04-10 DIAGNOSIS — E1129 Type 2 diabetes mellitus with other diabetic kidney complication: Secondary | ICD-10-CM

## 2022-04-10 DIAGNOSIS — I429 Cardiomyopathy, unspecified: Secondary | ICD-10-CM

## 2022-04-10 DIAGNOSIS — E1169 Type 2 diabetes mellitus with other specified complication: Secondary | ICD-10-CM

## 2022-04-10 DIAGNOSIS — E1159 Type 2 diabetes mellitus with other circulatory complications: Secondary | ICD-10-CM

## 2022-04-10 DIAGNOSIS — N3941 Urge incontinence: Secondary | ICD-10-CM

## 2022-04-10 DIAGNOSIS — I152 Hypertension secondary to endocrine disorders: Secondary | ICD-10-CM

## 2022-04-10 DIAGNOSIS — Z6835 Body mass index (BMI) 35.0-35.9, adult: Secondary | ICD-10-CM

## 2022-04-10 DIAGNOSIS — E785 Hyperlipidemia, unspecified: Secondary | ICD-10-CM

## 2022-04-10 DIAGNOSIS — E538 Deficiency of other specified B group vitamins: Secondary | ICD-10-CM

## 2022-04-10 DIAGNOSIS — G910 Communicating hydrocephalus: Secondary | ICD-10-CM

## 2022-04-10 DIAGNOSIS — Z8673 Personal history of transient ischemic attack (TIA), and cerebral infarction without residual deficits: Secondary | ICD-10-CM

## 2022-04-10 DIAGNOSIS — Z23 Encounter for immunization: Secondary | ICD-10-CM | POA: Diagnosis not present

## 2022-04-10 DIAGNOSIS — R809 Proteinuria, unspecified: Secondary | ICD-10-CM

## 2022-04-10 LAB — BAYER DCA HB A1C WAIVED: HB A1C (BAYER DCA - WAIVED): 8.6 % — ABNORMAL HIGH (ref 4.8–5.6)

## 2022-04-10 MED ORDER — CONTOUR NEXT TEST VI STRP
ORAL_STRIP | 12 refills | Status: DC
Start: 2022-04-10 — End: 2022-07-12

## 2022-04-10 MED ORDER — ACCU-CHEK SOFTCLIX LANCETS MISC
12 refills | Status: DC
Start: 2022-04-10 — End: 2022-07-12

## 2022-04-10 MED ORDER — METFORMIN HCL ER 500 MG PO TB24: 1000.0000 mg | ORAL_TABLET | Freq: Two times a day (BID) | ORAL | 4 refills | Status: DC

## 2022-04-10 NOTE — Assessment & Plan Note (Signed)
BMI 38.26 with T2DM, HTN.  Recommended eating smaller high protein, low fat meals more frequently and exercising 30 mins a day 5 times a week with a goal of 10-15lb weight loss in the next 3 months. Patient voiced their understanding and motivation to adhere to these recommendations.  Recommend continue cut back on alcohol.

## 2022-04-10 NOTE — Assessment & Plan Note (Signed)
Followed by urology, continue this collaboration and medications as prescribed by them.  Recent note reviewed.  Recommend they return for visit due to ongoing issues, his caregiver (brother) has been out due to stroke and been unable to take him recently. 

## 2022-04-10 NOTE — Assessment & Plan Note (Signed)
Ongoing, continue collaboration with neurology as needed and current memory medication as ordered by them in past.  Recommend they schedule follow-up with neurology, his caregiver (brother) has been out due to stroke and been unable to take him recently. 

## 2022-04-10 NOTE — Telephone Encounter (Signed)
Requested FL2 forms completed and faxed  back to ATTN: Anderson Malta at [phone].

## 2022-04-10 NOTE — Assessment & Plan Note (Signed)
Chronic, ongoing.  Continue current medication regimen and adjust as needed.  Lipid panel today.  Refills up to date. Return in 3 months. 

## 2022-04-10 NOTE — Assessment & Plan Note (Signed)
Chronic, stable. Recheck level today. Continue daily supplement which is benefiting neuropathy.

## 2022-04-10 NOTE — Assessment & Plan Note (Addendum)
Chronic, ongoing.  A1c 8.6% today.  Increase Metformin XR back to 1000 MG BID -- script sent, he denies GI issues.  Due to history of hypoglycemia, which he has had in past, will avoid restart of Glipizide at this time and if elevation in future consider SGLT2 or GLP1 add on. Continue Gabapentin for discomfort. Recommend he focus on diet, as still tends to snack a lot at home, and avoid alcohol use. Continue checking BS daily at home.  Return in 3 months.

## 2022-04-10 NOTE — Assessment & Plan Note (Signed)
Stable, followed by cardiology, continue this collaboration.  Recommend they schedule follow-up, his caregiver (brother) has been out due to stroke and been unable to take him recently. 

## 2022-04-10 NOTE — Assessment & Plan Note (Addendum)
Chronic, ongoing.  A1c 8.6% today.  Increase Metformin XR to 1000 MG BID -- script sent, he denies GI issues.  Due to history of hypoglycemia, which he has had in past, will avoid restart of Glipizide at this time and if elevation consider SGLT or GLP add on -- would be cautious with SGLT2 due to urinary incontinence at baseline. Continue Gabapentin for discomfort. Recommend he focus on diet, as still tends to snack a lot at home, and avoid alcohol use. Continue checking BS daily at home.  Return in 3 months.

## 2022-04-10 NOTE — Assessment & Plan Note (Addendum)
Continue collaboration with neurology.  Discussed goals with patient LDL <70, A1c <6.5%, and BP <130/80.   

## 2022-04-10 NOTE — Progress Notes (Signed)
BP 104/69   Pulse 70   Temp 98.6 F (37 C) (Oral)   Wt 294 lb (133.4 kg)   SpO2 95%   BMI 38.26 kg/m    Subjective:    Patient ID: [name], male    DOB: Jan 17, 1971, 51 y.o.   MRN: [id number]  HPI: [name] is a 51 y.o. male  Chief Complaint  Patient presents with   Diabetes   Hyperlipidemia   Hypertension   DIABETES Continues on Metformin XR 1000 MG daily with last A1c 7.6% in March.  Endorses intermittent poor diet choices.    He reports drinking about 2 beers a day and liquor on weekends (has not had liquor in awhile), staying about baseline.  History of B12 deficiency, recent levels improved with supplement.    Hypoglycemic episodes:no Polydipsia/polyuria: no Visual disturbance: no Chest pain: no Paresthesias: no Glucose Monitoring: yes             Accucheck frequency: daily -- out of strips             Fasting glucose: none              Post prandial:             Evening:             Before meals: Taking Insulin?: no             Long acting insulin:             Short acting insulin: Blood Pressure Monitoring: daily Retinal Examination: Up To Date -- last in September 2022 at [facility] Foot Exam: Up to Date Pneumovax: Up to Date Influenza: up to date Aspirin: yes   HYPERTENSION / HYPERLIPIDEMIA Continues on Amlodipine 10 MG, ASA, Plavix, Carvedilol 6.25 MG BID, Lisinopril 5 MG QDAY, and Lipitor 80 MG daily.  History of CVA, continues on Aricept for memory changes.  Has underlying chronic communicating hydrocephalus, no recent visit with neurology -- last was 06/20/2020. Satisfied with current treatment? yes Duration of hypertension: chronic BP monitoring frequency: rarely BP range: on average <130/80 BP medication side effects: no Duration of hyperlipidemia: chronic Cholesterol medication side effects: no Cholesterol supplements: none Medication compliance: good compliance Aspirin: yes Recent stressors: no Recurrent headaches: no Visual  changes: no Palpitations: no Dyspnea: no Chest pain: no Lower extremity edema: no Dizzy/lightheaded: no  The ASCVD Risk score (Arnett DK, et al., 2019) failed to calculate for the following reasons:   The patient has a prior MI or stroke diagnosis  DEPRESSION Continues on Celexa 10 MG daily. Mood status: stable Satisfied with current treatment?: yes Symptom severity: moderate  Duration of current treatment : chronic Side effects: no Medication compliance: good compliance Psychotherapy/counseling: in the past Depressed mood:none Anxious mood: no Anhedonia: no Significant weight loss or gain: no Insomnia: none Fatigue: no Feelings of worthlessness or guilt: no Impaired concentration/indecisiveness: no Suicidal ideations: no Hopelessness: no Crying spells: no    09/10/2021    2:51 PM 07/10/2021    9:14 AM 04/24/2021   11:05 AM 04/24/2021   11:04 AM 04/03/2021    5:54 PM  Depression screen PHQ 2/9  Decreased Interest 0 0 0 0 0  Down, Depressed, Hopeless 0 0 0 0 0  PHQ - 2 Score 0 0 0 0 0  Altered sleeping 0  0    Tired, decreased energy 0  0    Change in appetite 0  0    Feeling  bad or failure about yourself  0  0    Trouble concentrating 0  1    Moving slowly or fidgety/restless 0  0    Suicidal thoughts 0  0    PHQ-9 Score 0  1    Difficult doing work/chores Not difficult at all  Not difficult at all         04/24/2021   11:07 AM 10/01/2019   12:11 PM  GAD 7 : Generalized Anxiety Score  Nervous, Anxious, on Edge 0 0  Control/stop worrying 0 0  Worry too much - different things 0 0  Trouble relaxing 0 0  Restless 0 0  Easily annoyed or irritable 0 0  Afraid - awful might happen 0 0  Total GAD 7 Score 0 0  Anxiety Difficulty Not difficult at all     URINARY INCONTINENCE Followed by urology with last visit 11/01/21 with Dr. Sherron Monday.  He is currently taking Gemtesa and Flomax.  They are considering PTN stimulation if ongoing issues.   Status:  uncontrolled Satisfied with current treatment?: yes Medication side effects: no Medication compliance: good compliance Duration: chronic Nocturia: 2-3x per night Urinary frequency:yes Incomplete voiding: no Urgency: yes Weak urinary stream: no Straining to start stream: no Dysuria: no Onset: gradual Severity: moderate  Relevant past medical, surgical, family and social history reviewed and updated as indicated. Interim medical history since our last visit reviewed. Allergies and medications reviewed and updated.  Review of Systems  Constitutional:  Negative for activity change, diaphoresis, fatigue and fever.  Respiratory:  Negative for cough, chest tightness, shortness of breath and wheezing.   Cardiovascular:  Negative for chest pain, palpitations and leg swelling.  Gastrointestinal:  Negative for abdominal distention, abdominal pain, constipation, diarrhea, nausea and vomiting.  Endocrine: Negative for cold intolerance, heat intolerance, polydipsia, polyphagia and polyuria.  Neurological:  Negative for dizziness, syncope, weakness, light-headedness, numbness and headaches.  Psychiatric/Behavioral: Negative.      Per HPI unless specifically indicated above     Objective:    BP 104/69   Pulse 70   Temp 98.6 F (37 C) (Oral)   Wt 294 lb (133.4 kg)   SpO2 95%   BMI 38.26 kg/m   Wt Readings from Last 3 Encounters:  04/10/22 294 lb (133.4 kg)  01/24/22 294 lb 1.5 oz (133.4 kg)  11/06/21 294 lb (133.4 kg)    Physical Exam Vitals and nursing note reviewed.  Constitutional:      General: He is awake. He is not in acute distress.    Appearance: He is well-developed. He is obese. He is not ill-appearing.  HENT:     Head: Normocephalic and atraumatic.     Right Ear: Hearing normal. No drainage.     Left Ear: Hearing normal. No drainage.  Eyes:     General: Lids are normal.        Right eye: No discharge.        Left eye: No discharge.     Conjunctiva/sclera:  Conjunctivae normal.     Pupils: Pupils are equal, round, and reactive to light.  Neck:     Thyroid: No thyromegaly.     Vascular: No carotid bruit.  Cardiovascular:     Rate and Rhythm: Normal rate and regular rhythm.     Heart sounds: Normal heart sounds, S1 normal and S2 normal. No murmur heard.    No gallop.  Pulmonary:     Effort: Pulmonary effort is normal. No accessory muscle usage or  respiratory distress.     Breath sounds: Normal breath sounds.  Abdominal:     General: Bowel sounds are normal.     Palpations: Abdomen is soft.  Musculoskeletal:        General: Normal range of motion.     Cervical back: Normal range of motion and neck supple.     Right lower leg: Edema (trace) present.     Left lower leg: Edema (trace) present.     Comments: Utilizing electric wheel chair today.  Skin:    General: Skin is warm and dry.     Capillary Refill: Capillary refill takes less than 2 seconds.  Neurological:     Mental Status: He is alert and oriented to person, place, and time.  Psychiatric:        Attention and Perception: Attention normal.        Mood and Affect: Mood normal.        Speech: Speech normal.        Behavior: Behavior normal. Behavior is cooperative.    Results for orders placed or performed during the hospital encounter of 01/24/22  CBC with Differential  Result Value Ref Range   WBC 4.0 4.0 - 10.5 K/uL   RBC 4.44 4.22 - 5.81 MIL/uL   Hemoglobin 13.1 13.0 - 17.0 g/dL   HCT 49.8 26.4 - 15.8 %   MCV 91.2 80.0 - 100.0 fL   MCH 29.5 26.0 - 34.0 pg   MCHC 32.3 30.0 - 36.0 g/dL   RDW 30.9 40.7 - 68.0 %   Platelets 216 150 - 400 K/uL   nRBC 0.0 0.0 - 0.2 %   Neutrophils Relative % 54 %   Neutro Abs 2.2 1.7 - 7.7 K/uL   Lymphocytes Relative 25 %   Lymphs Abs 1.0 0.7 - 4.0 K/uL   Monocytes Relative 12 %   Monocytes Absolute 0.5 0.1 - 1.0 K/uL   Eosinophils Relative 9 %   Eosinophils Absolute 0.4 0.0 - 0.5 K/uL   Basophils Relative 0 %   Basophils Absolute  0.0 0.0 - 0.1 K/uL   Immature Granulocytes 0 %   Abs Immature Granulocytes 0.01 0.00 - 0.07 K/uL  Comprehensive metabolic panel  Result Value Ref Range   Sodium 137 135 - 145 mmol/L   Potassium 4.1 3.5 - 5.1 mmol/L   Chloride 104 98 - 111 mmol/L   CO2 21 (L) 22 - 32 mmol/L   Glucose, Bld 225 (H) 70 - 99 mg/dL   BUN 13 6 - 20 mg/dL   Creatinine, Ser 8.81 0.61 - 1.24 mg/dL   Calcium 8.8 (L) 8.9 - 10.3 mg/dL   Total Protein 7.5 6.5 - 8.1 g/dL   Albumin 3.9 3.5 - 5.0 g/dL   AST 17 15 - 41 U/L   ALT 12 0 - 44 U/L   Alkaline Phosphatase 74 38 - 126 U/L   Total Bilirubin 1.4 (H) 0.3 - 1.2 mg/dL   GFR, Estimated >10 >31 mL/min   Anion gap 12 5 - 15  Troponin I (High Sensitivity)  Result Value Ref Range   Troponin I (High Sensitivity) 3 <18 ng/L  Troponin I (High Sensitivity)  Result Value Ref Range   Troponin I (High Sensitivity) 3 <18 ng/L      Assessment & Plan:   Problem List Items Addressed This Visit       Cardiovascular and Mediastinum   Cardiomyopathy (HCC)    Stable, followed by cardiology, continue this collaboration.  Recommend they  schedule follow-up, his caregiver (brother) has been out due to stroke and been unable to take him recently.      Hypertension associated with diabetes (HCC)    Chronic, ongoing.  BP at goal today.  Continue current medication regimen and adjust as needed.  Recommend checking BP daily at home and documenting for provider. He has a BP cuff available at his home. LABS: CMP.  Refills up to date.  Return in 3 months for f/u.      Relevant Medications   metFORMIN (GLUCOPHAGE-XR) 500 MG 24 hr tablet   Other Relevant Orders   Bayer DCA Hb A1c Waived   Microalbumin, Urine Waived     Endocrine   Hyperlipidemia associated with type 2 diabetes mellitus (HCC)    Chronic, ongoing.  Continue current medication regimen and adjust as needed.  Lipid panel today.  Refills up to date. Return in 3 months.      Relevant Medications   metFORMIN  (GLUCOPHAGE-XR) 500 MG 24 hr tablet   Other Relevant Orders   Bayer DCA Hb A1c Waived   Comprehensive metabolic panel   Lipid Panel w/o Chol/HDL Ratio   Type 2 diabetes mellitus with diabetic polyneuropathy (HCC)    Chronic, ongoing.  A1c 8.6% today.  Increase Metformin XR back to 1000 MG BID -- script sent, he denies GI issues.  Due to history of hypoglycemia, which he has had in past, will avoid restart of Glipizide at this time and if elevation in future consider SGLT2 or GLP1 add on. Continue Gabapentin for discomfort. Recommend he focus on diet, as still tends to snack a lot at home, and avoid alcohol use. Continue checking BS daily at home.  Return in 3 months.      Relevant Medications   metFORMIN (GLUCOPHAGE-XR) 500 MG 24 hr tablet   Other Relevant Orders   Bayer DCA Hb A1c Waived   Microalbumin, Urine Waived   Type 2 diabetes mellitus with proteinuria (HCC) - Primary    Chronic, ongoing.  A1c 8.6% today.  Increase Metformin XR to 1000 MG BID -- script sent, he denies GI issues.  Due to history of hypoglycemia, which he has had in past, will avoid restart of Glipizide at this time and if elevation consider SGLT or GLP add on -- would be cautious with SGLT2 due to urinary incontinence at baseline. Continue Gabapentin for discomfort. Recommend he focus on diet, as still tends to snack a lot at home, and avoid alcohol use. Continue checking BS daily at home.  Return in 3 months.      Relevant Medications   metFORMIN (GLUCOPHAGE-XR) 500 MG 24 hr tablet   Other Relevant Orders   Bayer DCA Hb A1c Waived   Microalbumin, Urine Waived     Nervous and Auditory   Chronic communicating hydrocephalus (HCC)    Ongoing, continue collaboration with neurology as needed and current memory medication as ordered by them in past.  Recommend they schedule follow-up with neurology, his caregiver (brother) has been out due to stroke and been unable to take him recently.        Other   History of  stroke    Continue collaboration with neurology.  Discussed goals with patient LDL <70, A1c <6.5%, and BP <130/80.        Obesity    BMI 38.26 with T2DM, HTN.  Recommended eating smaller high protein, low fat meals more frequently and exercising 30 mins a day 5 times a week with a goal  of 10-15lb weight loss in the next 3 months. Patient voiced their understanding and motivation to adhere to these recommendations.  Recommend continue cut back on alcohol.      Relevant Medications   metFORMIN (GLUCOPHAGE-XR) 500 MG 24 hr tablet   Urge incontinence of urine    Followed by urology, continue this collaboration and medications as prescribed by them.  Recent note reviewed.  Recommend they return for visit due to ongoing issues, his caregiver (brother) has been out due to stroke and been unable to take him recently.      Vitamin B12 deficiency    Chronic, stable. Recheck level today. Continue daily supplement which is benefiting neuropathy.      Relevant Orders   Vitamin B12   Other Visit Diagnoses     Flu vaccine need       Flu vaccine provided today.   Relevant Orders   Flu Vaccine QUAD 6+ mos PF IM (Fluarix Quad PF)        Follow up plan: Return in about 3 months (around 07/11/2022) for T2DM, HTN/HLD, URINARY INCONTINENCE.

## 2022-04-10 NOTE — Assessment & Plan Note (Signed)
Chronic, ongoing.  BP at goal today.  Continue current medication regimen and adjust as needed.  Recommend checking BP daily at home and documenting for provider. He has a BP cuff available at his home. LABS: CMP.  Refills up to date.  Return in 3 months for f/u.

## 2022-04-11 LAB — LIPID PANEL W/O CHOL/HDL RATIO
Cholesterol, Total: 128 mg/dL (ref 100–199)
HDL: 49 mg/dL (ref >39)
LDL Chol Calc (NIH): 65 mg/dL (ref 0–99)
Triglycerides: 69 mg/dL (ref 0–149)
VLDL Cholesterol Cal: 14 mg/dL (ref 5–40)

## 2022-04-11 LAB — COMPREHENSIVE METABOLIC PANEL
ALT: 10 IU/L (ref 0–44)
AST: 9 IU/L (ref 0–40)
Albumin/Globulin Ratio: 1.7 (ref 1.2–2.2)
Albumin: 4.5 g/dL (ref 4.1–5.1)
Alkaline Phosphatase: 97 IU/L (ref 44–121)
BUN/Creatinine Ratio: 10 (ref 9–20)
BUN: 14 mg/dL (ref 6–24)
Bilirubin Total: 0.5 mg/dL (ref 0.0–1.2)
CO2: 22 mmol/L (ref 20–29)
Calcium: 9.4 mg/dL (ref 8.7–10.2)
Chloride: 100 mmol/L (ref 96–106)
Creatinine, Ser: 1.36 mg/dL — ABNORMAL HIGH (ref 0.76–1.27)
Globulin, Total: 2.7 g/dL (ref 1.5–4.5)
Glucose: 219 mg/dL — ABNORMAL HIGH (ref 70–99)
Potassium: 4.2 mmol/L (ref 3.5–5.2)
Sodium: 137 mmol/L (ref 134–144)
Total Protein: 7.2 g/dL (ref 6.0–8.5)
eGFR: 64 mL/min/{1.73_m2} (ref >59)

## 2022-04-11 LAB — VITAMIN B12: Vitamin B-12: 1132 pg/mL (ref 232–1245)

## 2022-04-11 NOTE — Progress Notes (Signed)
Please inform [name] labs have returned, do not believe he checks MyChart: Good day [name], your labs have returned: - Kidney function, creatinine and eGFR, remains stable (a little elevation in creatinine - recommend increase water intake), as is liver function, AST and ALT.  Glucose, sugar level, is elevated as expected, increase Metformin to 2 tablets twice a day as we discussed. - Remainder of labs are all stable, continue all current medications.  Any questions? Keep being amazing!!  Thank you for allowing me to participate in your care.  I appreciate you. Kindest regards, Carynn Felling

## 2022-04-29 ENCOUNTER — Telehealth: Payer: Self-pay | Admitting: Nurse Practitioner

## 2022-04-29 NOTE — Telephone Encounter (Signed)
[facility] called and spoke to [place], [facility] about the refill(s) metformin requested. Advised it was sent on 04/10/22. He says metformin is available for pickup. The medication he needs is [place] and he will need it so that the patient can pick up all at once. Advised another provider is who refilled this, so I will call the patient to have him call that provider. Patient called on mobile number and home number [phone] listed, left VM to return the call to the office. Patient will need to be advised to contact urology office for refill of Gemtasa.

## 2022-04-29 NOTE — Telephone Encounter (Signed)
Copied from [place] [id number]. Topic: General - Other >> Apr 29, 2022  2:04 PM Everette C wrote: Reason for CRM: Medication Refill - Medication: metFORMIN (GLUCOPHAGE-XR) 500 MG 24 hr tablet [[id number]]   Has the patient contacted their pharmacy? Yes.  The patient has been directed to contact their PCP  (Agent: If no, request that the patient contact the pharmacy for the refill. If patient does not wish to contact the pharmacy document the reason why and proceed with request.) (Agent: If yes, when and what did the pharmacy advise?)  Preferred Pharmacy (with phone number or street name): [facility] [place], [place] [address] Phone: [phone] Fax: [phone] Hours: Not open 24 hours  Has the patient been seen for an appointment in the last year OR does the patient have an upcoming appointment? Yes.    Agent: Please be advised that RX refills may take up to 3 business days. We ask that you follow-up with your pharmacy.

## 2022-04-29 NOTE — Telephone Encounter (Signed)
Patient called on cell number, no answer, mailbox not set up yet per recording. See the below message to let patient know if he calls back about the refill.

## 2022-05-24 ENCOUNTER — Other Ambulatory Visit: Payer: Self-pay | Admitting: *Deleted

## 2022-05-24 MED ORDER — TAMSULOSIN HCL 0.4 MG PO CAPS: 0.4000 mg | ORAL_CAPSULE | Freq: Every day | ORAL | 3 refills | Status: DC

## 2022-05-24 MED ORDER — GEMTESA 75 MG PO TABS: 75.0000 mg | ORAL_TABLET | Freq: Every day | ORAL | 3 refills | Status: DC

## 2022-05-24 NOTE — Telephone Encounter (Signed)
Faxed requests for Gemtesa and Flomax, pt has no-showed and he does not have any future appts scheduled. Pt did get cardiac clearance for PTNS, last seen 10/2021, ok to fill medications?

## 2022-05-24 NOTE — Telephone Encounter (Signed)
.  left message to have patient/caregiver (per DPR) return my call.  rx sent to pharmacy by e-script

## 2022-06-06 ENCOUNTER — Ambulatory Visit: Payer: Medicare Other | Admitting: Podiatry

## 2022-06-21 ENCOUNTER — Other Ambulatory Visit: Payer: Self-pay | Admitting: Nurse Practitioner

## 2022-06-21 NOTE — Telephone Encounter (Signed)
Requested Prescriptions  Pending Prescriptions Disp Refills   lisinopril (ZESTRIL) 5 MG tablet [Pharmacy Med Name: LISINOPRIL 5 MG TAB] 90 tablet 0    Sig: TAKE 1 TABLET BY MOUTH ONCE DAILY     Cardiovascular:  ACE Inhibitors Failed - 06/21/2022 12:11 PM      Failed - Cr in normal range and within 180 days    Creatinine  Date Value Ref Range Status  07/07/2014 1.19 0.60 - 1.30 mg/dL Final   Creatinine, Ser  Date Value Ref Range Status  04/10/2022 1.36 (H) 0.76 - 1.27 mg/dL Final         Passed - K in normal range and within 180 days    Potassium  Date Value Ref Range Status  04/10/2022 4.2 3.5 - 5.2 mmol/L Final  07/07/2014 3.7 3.5 - 5.1 mmol/L Final         Passed - Patient is not pregnant      Passed - Last BP in normal range    BP Readings from Last 1 Encounters:  04/10/22 104/69         Passed - Valid encounter within last 6 months    Recent Outpatient Visits           2 months ago Type 2 diabetes mellitus with proteinuria (HCC)   [facility] Cannady, [name] T, NP   9 months ago Type 2 diabetes mellitus with proteinuria (HCC)   [facility] [place], [place] T, NP   1 year ago Great toe pain, left   [facility] [place]El[place], Lauren A, NP   1 year ago Type 2 diabetes mellitus with diabetic polyneuropathy, without long-term current use of insulin (HCC)   [facility] [place], [place] T, NP   1 year ago Type 2 diabetes mellitus with proteinuria (HCC)   [facility] [place], [place] T, NP       Future Appointments             In 3 weeks Cannady, Dorie Rank, NP [facility], [facility]             carvedilol (COREG) 6.25 MG tablet [Pharmacy Med Name: CARVEDILOL 6.25 MG TAB] 180 tablet 0    Sig: TAKE 1 TABLET BY MOUTH TWICE DAILY WITH A MEAL     Cardiovascular: Beta Blockers 3 Failed - 06/21/2022 12:11 PM      Failed - Cr in normal range and within 360 days    Creatinine  Date Value Ref Range Status   07/07/2014 1.19 0.60 - 1.30 mg/dL Final   Creatinine, Ser  Date Value Ref Range Status  04/10/2022 1.36 (H) 0.76 - 1.27 mg/dL Final         Passed - AST in normal range and within 360 days    AST  Date Value Ref Range Status  04/10/2022 9 0 - 40 IU/L Final   SGOT(AST)  Date Value Ref Range Status  07/07/2014 10 (L) 15 - 37 Unit/L Final         Passed - ALT in normal range and within 360 days    ALT  Date Value Ref Range Status  04/10/2022 10 0 - 44 IU/L Final   SGPT (ALT)  Date Value Ref Range Status  07/07/2014 15 U/L Final    Comment:    14-63 NOTE: New Reference Range 01/25/14          Passed - Last BP in normal range    BP Readings from Last  1 Encounters:  04/10/22 104/69         Passed - Last Heart Rate in normal range    Pulse Readings from Last 1 Encounters:  04/10/22 70         Passed - Valid encounter within last 6 months    Recent Outpatient Visits           2 months ago Type 2 diabetes mellitus with proteinuria (HCC)   [facility] [place], [name] T, NP   9 months ago Type 2 diabetes mellitus with proteinuria (HCC)   [facility] [place], [name] T, NP   1 year ago Great toe pain, left   [facility] McElwee, Lauren A, NP   1 year ago Type 2 diabetes mellitus with diabetic polyneuropathy, without long-term current use of insulin (HCC)   [facility] [place], [place] T, NP   1 year ago Type 2 diabetes mellitus with proteinuria (HCC)   [facility] [place], Dorie Rank, NP       Future Appointments             In 3 weeks Cannady, Dorie Rank, NP [facility], [facility]

## 2022-07-08 NOTE — Patient Instructions (Signed)
Diabetes Mellitus Basics  Diabetes mellitus, or diabetes, is a long-term (chronic) disease. It occurs when the body does not properly use sugar (glucose) that is released from food after you eat. Diabetes mellitus may be caused by one or both of these problems: Your pancreas does not make enough of a hormone called insulin. Your body does not react in a normal way to the insulin that it makes. Insulin lets glucose enter cells in your body. This gives you energy. If you have diabetes, glucose cannot get into cells. This causes high blood glucose (hyperglycemia). How to treat and manage diabetes You may need to take insulin or other diabetes medicines daily to keep your glucose in balance. If you are prescribed insulin, you will learn how to give yourself insulin by injection. You may need to adjust the amount of insulin you take based on the foods that you eat. You will need to check your blood glucose levels using a glucose monitor as told by your health care provider. The readings can help determine if you have low or high blood glucose. Generally, you should have these blood glucose levels: Before meals (preprandial): 80-130 mg/dL (4.4-7.2 mmol/L). After meals (postprandial): below 180 mg/dL (10 mmol/L). Hemoglobin A1c (HbA1c) level: less than 7%. Your health care provider will set treatment goals for you. Keep all follow-up visits. This is important. Follow these instructions at home: Diabetes medicines Take your diabetes medicines every day as told by your health care provider. List your diabetes medicines here: Name of medicine: ______________________________ Amount (dose): _______________ Time (a.m./p.m.): _______________ Notes: ___________________________________ Name of medicine: ______________________________ Amount (dose): _______________ Time (a.m./p.m.): _______________ Notes: ___________________________________ Name of medicine: ______________________________ Amount (dose):  _______________ Time (a.m./p.m.): _______________ Notes: ___________________________________ Insulin If you use insulin, list the types of insulin you use here: Insulin type: ______________________________ Amount (dose): _______________ Time (a.m./p.m.): _______________Notes: ___________________________________ Insulin type: ______________________________ Amount (dose): _______________ Time (a.m./p.m.): _______________ Notes: ___________________________________ Insulin type: ______________________________ Amount (dose): _______________ Time (a.m./p.m.): _______________ Notes: ___________________________________ Insulin type: ______________________________ Amount (dose): _______________ Time (a.m./p.m.): _______________ Notes: ___________________________________ Insulin type: ______________________________ Amount (dose): _______________ Time (a.m./p.m.): _______________ Notes: ___________________________________ Managing blood glucose  Check your blood glucose levels using a glucose monitor as told by your health care provider. Write down the times that you check your glucose levels here: Time: _______________ Notes: ___________________________________ Time: _______________ Notes: ___________________________________ Time: _______________ Notes: ___________________________________ Time: _______________ Notes: ___________________________________ Time: _______________ Notes: ___________________________________ Time: _______________ Notes: ___________________________________  Low blood glucose Low blood glucose (hypoglycemia) is when glucose is at or below 70 mg/dL (3.9 mmol/L). Symptoms may include: Feeling: Hungry. Sweaty and clammy. Irritable or easily upset. Dizzy. Sleepy. Having: A fast heartbeat. A headache. A change in your vision. Numbness around the mouth, lips, or tongue. Having trouble with: Moving (coordination). Sleeping. Treating low blood glucose To treat low blood  glucose, eat or drink something containing sugar right away. If you can think clearly and swallow safely, follow the 15:15 rule: Take 15 grams of a fast-acting carb (carbohydrate), as told by your health care provider. Some fast-acting carbs are: Glucose tablets: take 3-4 tablets. Hard candy: eat 3-5 pieces. Fruit juice: drink 4 oz (120 mL). Regular (not diet) soda: drink 4-6 oz (120-180 mL). Honey or sugar: eat 1 Tbsp (15 mL). Check your blood glucose levels 15 minutes after you take the carb. If your glucose is still at or below 70 mg/dL (3.9 mmol/L), take 15 grams of a carb again. If your glucose does not go above 70 mg/dL (3.9 mmol/L) after   3 tries, get help right away. After your glucose goes back to normal, eat a meal or a snack within 1 hour. Treating very low blood glucose If your glucose is at or below 54 mg/dL (3 mmol/L), you have very low blood glucose (severe hypoglycemia). This is an emergency. Do not wait to see if the symptoms will go away. Get medical help right away. Call your local emergency services (911 in the [place] Do not drive yourself to the hospital. Questions to ask your health care provider Should I talk with a diabetes educator? What equipment will I need to care for myself at home? What diabetes medicines do I need? When should I take them? How often do I need to check my blood glucose levels? What number can I call if I have questions? When is my follow-up visit? Where can I find a support group for people with diabetes? Where to find more information American Diabetes Association: www.diabetes.org Association of Diabetes Care and Education Specialists: www.diabeteseducator.org Contact a health care provider if: Your blood glucose is at or above 240 mg/dL (13.3 mmol/L) for 2 days in a row. You have been sick or have had a fever for 2 days or more, and you are not getting better. You have any of these problems for more than 6 hours: You cannot eat or  drink. You feel nauseous. You vomit. You have diarrhea. Get help right away if: Your blood glucose is lower than 54 mg/dL (3 mmol/L). You get confused. You have trouble thinking clearly. You have trouble breathing. These symptoms may represent a serious problem that is an emergency. Do not wait to see if the symptoms will go away. Get medical help right away. Call your local emergency services (911 in the [place] Do not drive yourself to the hospital. Summary Diabetes mellitus is a chronic disease that occurs when the body does not properly use sugar (glucose) that is released from food after you eat. Take insulin and diabetes medicines as told. Check your blood glucose every day, as often as told. Keep all follow-up visits. This is important. This information is not intended to replace advice given to you by your health care provider. Make sure you discuss any questions you have with your health care provider. Document Revised: 10/26/2019 Document Reviewed: 10/26/2019 Elsevier Patient Education  2023 [employer]  

## 2022-07-12 ENCOUNTER — Encounter: Payer: Self-pay | Admitting: Nurse Practitioner

## 2022-07-12 ENCOUNTER — Ambulatory Visit (INDEPENDENT_AMBULATORY_CARE_PROVIDER_SITE_OTHER): Payer: Medicare Other | Admitting: Nurse Practitioner

## 2022-07-12 VITALS — BP 106/71 | HR 69 | Temp 98.0°F | Ht 73.5 in | Wt 269.0 lb

## 2022-07-12 DIAGNOSIS — E1142 Type 2 diabetes mellitus with diabetic polyneuropathy: Secondary | ICD-10-CM

## 2022-07-12 DIAGNOSIS — Z0000 Encounter for general adult medical examination without abnormal findings: Secondary | ICD-10-CM | POA: Diagnosis not present

## 2022-07-12 DIAGNOSIS — E1169 Type 2 diabetes mellitus with other specified complication: Secondary | ICD-10-CM | POA: Diagnosis not present

## 2022-07-12 DIAGNOSIS — E785 Hyperlipidemia, unspecified: Secondary | ICD-10-CM

## 2022-07-12 DIAGNOSIS — E1129 Type 2 diabetes mellitus with other diabetic kidney complication: Secondary | ICD-10-CM | POA: Diagnosis not present

## 2022-07-12 DIAGNOSIS — E1159 Type 2 diabetes mellitus with other circulatory complications: Secondary | ICD-10-CM

## 2022-07-12 DIAGNOSIS — Z7409 Other reduced mobility: Secondary | ICD-10-CM

## 2022-07-12 DIAGNOSIS — I429 Cardiomyopathy, unspecified: Secondary | ICD-10-CM

## 2022-07-12 DIAGNOSIS — Z7984 Long term (current) use of oral hypoglycemic drugs: Secondary | ICD-10-CM

## 2022-07-12 DIAGNOSIS — I152 Hypertension secondary to endocrine disorders: Secondary | ICD-10-CM

## 2022-07-12 DIAGNOSIS — Z8673 Personal history of transient ischemic attack (TIA), and cerebral infarction without residual deficits: Secondary | ICD-10-CM

## 2022-07-12 DIAGNOSIS — R809 Proteinuria, unspecified: Secondary | ICD-10-CM

## 2022-07-12 DIAGNOSIS — Z6835 Body mass index (BMI) 35.0-35.9, adult: Secondary | ICD-10-CM

## 2022-07-12 DIAGNOSIS — R159 Full incontinence of feces: Secondary | ICD-10-CM

## 2022-07-12 DIAGNOSIS — N3941 Urge incontinence: Secondary | ICD-10-CM

## 2022-07-12 DIAGNOSIS — G910 Communicating hydrocephalus: Secondary | ICD-10-CM

## 2022-07-12 LAB — BAYER DCA HB A1C WAIVED: HB A1C (BAYER DCA - WAIVED): 6.1 % — ABNORMAL HIGH (ref 4.8–5.6)

## 2022-07-12 MED ORDER — GEMTESA 75 MG PO TABS: 75.0000 mg | ORAL_TABLET | Freq: Every day | ORAL | 3 refills | Status: DC

## 2022-07-12 MED ORDER — ACCU-CHEK SOFTCLIX LANCETS MISC
12 refills | Status: DC
Start: 2022-07-12 — End: 2023-07-29

## 2022-07-12 MED ORDER — LISINOPRIL 5 MG PO TABS: 5.0000 mg | ORAL_TABLET | Freq: Every day | ORAL | 4 refills | Status: DC

## 2022-07-12 MED ORDER — ASPIRIN 81 MG PO TBEC: 81.0000 mg | DELAYED_RELEASE_TABLET | Freq: Every day | ORAL | 4 refills | Status: DC

## 2022-07-12 MED ORDER — VITAMIN B-12 1000 MCG PO TABS: 1000.0000 ug | ORAL_TABLET | Freq: Every day | ORAL | 4 refills | Status: DC

## 2022-07-12 MED ORDER — GABAPENTIN 300 MG PO CAPS: 300.0000 mg | ORAL_CAPSULE | Freq: Three times a day (TID) | ORAL | 4 refills | Status: DC

## 2022-07-12 MED ORDER — AMLODIPINE BESYLATE 10 MG PO TABS: 10.0000 mg | ORAL_TABLET | Freq: Every day | ORAL | 4 refills | Status: DC

## 2022-07-12 MED ORDER — TAMSULOSIN HCL 0.4 MG PO CAPS: 0.4000 mg | ORAL_CAPSULE | Freq: Every day | ORAL | 4 refills | Status: DC

## 2022-07-12 MED ORDER — DONEPEZIL HCL 5 MG PO TABS: 5.0000 mg | ORAL_TABLET | Freq: Every day | ORAL | 4 refills | Status: DC

## 2022-07-12 MED ORDER — CONTOUR NEXT TEST VI STRP: ORAL_STRIP | 12 refills | Status: DC

## 2022-07-12 MED ORDER — ATORVASTATIN CALCIUM 80 MG PO TABS: ORAL_TABLET | ORAL | 4 refills | Status: DC

## 2022-07-12 MED ORDER — CLOPIDOGREL BISULFATE 75 MG PO TABS: ORAL_TABLET | ORAL | 4 refills | Status: DC

## 2022-07-12 MED ORDER — CITALOPRAM HYDROBROMIDE 10 MG PO TABS: 10.0000 mg | ORAL_TABLET | Freq: Every day | ORAL | 4 refills | Status: DC

## 2022-07-12 MED ORDER — CARVEDILOL 6.25 MG PO TABS: 6.2500 mg | ORAL_TABLET | Freq: Two times a day (BID) | ORAL | 4 refills | Status: DC

## 2022-07-12 MED ORDER — CONTOUR NEXT MONITOR W/DEVICE KIT
PACK | 0 refills | Status: DC
Start: 2022-07-12 — End: 2023-10-27

## 2022-07-12 NOTE — Progress Notes (Signed)
BP 106/71   Pulse 69   Temp 98 F (36.7 C) (Oral)   Ht 6' 1.5" (1.867 m)   Wt 269 lb (122 kg)   SpO2 98%   BMI 35.01 kg/m    Subjective:    Patient ID: [name], male    DOB: 1971/03/02, 52 y.o.   MRN: [id number]  HPI: [name] is a 52 y.o. male presenting on 07/12/2022 for Medicare Wellness and diabetes follow-up.  He currently lives with: brother  DIABETES Continues on Metformin XR 1000 MG daily with last A1c 8.6% in October, recommended he increase his Metformin XR to 1000 MG BID. When went up on Metformin had decreased appetite.    He reports drinking about 2 beers a day, reduction.  History of B12 deficiency, recent levels improved with supplement.    Hypoglycemic episodes:no Polydipsia/polyuria: no Visual disturbance: no Chest pain: no Paresthesias: no Glucose Monitoring: yes             Accucheck frequency: not checking             Fasting glucose: none              Post prandial:             Evening:             Before meals: Taking Insulin?: no             Long acting insulin:             Short acting insulin: Blood Pressure Monitoring: daily Retinal Examination: Not Up To Date -- last in September 2022 at [place] Exam: Up to Date Pneumovax: Up to Date Influenza: up to date Aspirin: yes   HYPERTENSION / HYPERLIPIDEMIA Continues on Amlodipine 10 MG, ASA, Plavix, Carvedilol 6.25 MG BID, Lisinopril 5 MG QDAY, and Lipitor 80 MG daily.  Saw cardiology last 11/06/21.  History of CVA, continues on Aricept for memory changes.  Has underlying chronic communicating hydrocephalus, no recent visit with neurology, have recommended her return -- last was 06/20/2020.  Has not seen GI since 07/06/2020 -- went initially for stool incontinence, his brother reports today this is worsening and would like to return to GI.  Has incontinence with every bowel movement.  Brother tries to keep him on bowel routine.  Has underlying issues with mobility due to past  CVA. Satisfied with current treatment? yes Duration of hypertension: chronic BP monitoring frequency: rarely BP range: on average <130/80 BP medication side effects: no Duration of hyperlipidemia: chronic Cholesterol medication side effects: no Cholesterol supplements: none Medication compliance: good compliance Aspirin: yes Recent stressors: no Recurrent headaches: no Visual changes: no Palpitations: no Dyspnea: no Chest pain: no Lower extremity edema: no Dizzy/lightheaded: no  The ASCVD Risk score (Arnett DK, et al., 2019) failed to calculate for the following reasons:   The patient has a prior MI or stroke diagnosis  DEPRESSION Continues on Celexa 10 MG daily. Mood status: stable Satisfied with current treatment?: yes Symptom severity: moderate  Duration of current treatment : chronic Side effects: no Medication compliance: good compliance Psychotherapy/counseling: in the past Depressed mood:none Anxious mood: no Anhedonia: no Significant weight loss or gain: no Insomnia: none Fatigue: no Feelings of worthlessness or guilt: no Impaired concentration/indecisiveness: no Suicidal ideations: no Hopelessness: no Crying spells: no    07/12/2022    5:13 PM 09/10/2021    2:51 PM 07/10/2021    9:14 AM 04/24/2021   11:05 AM  04/24/2021   11:04 AM  Depression screen PHQ 2/9  Decreased Interest 1 0 0 0 0  Down, Depressed, Hopeless 0 0 0 0 0  PHQ - 2 Score 1 0 0 0 0  Altered sleeping 1 0  0   Tired, decreased energy 1 0  0   Change in appetite 0 0  0   Feeling bad or failure about yourself  0 0  0   Trouble concentrating 0 0  1   Moving slowly or fidgety/restless 0 0  0   Suicidal thoughts 0 0  0   PHQ-9 Score 3 0  1   Difficult doing work/chores Not difficult at all Not difficult at all  Not difficult at all        07/12/2022    5:13 PM 04/24/2021   11:07 AM 10/01/2019   12:11 PM  GAD 7 : Generalized Anxiety Score  Nervous, Anxious, on Edge 0 0 0  Control/stop  worrying 0 0 0  Worry too much - different things 0 0 0  Trouble relaxing 0 0 0  Restless 0 0 0  Easily annoyed or irritable 0 0 0  Afraid - awful might happen 0 0 0  Total GAD 7 Score 0 0 0  Anxiety Difficulty Not difficult at all Not difficult at all    URINARY INCONTINENCE Followed by urology, 11/01/21 with Dr. Sherron Monday.  He is currently taking Gemtesa and Flomax.  They are considering PTN stimulation if ongoing issues.  Out of Gemtesa. Status: uncontrolled Satisfied with current treatment?: yes Medication side effects: no Medication compliance: good compliance Duration: chronic Nocturia: 2-3x per night Urinary frequency:yes Incomplete voiding: no Urgency: yes Weak urinary stream: no Straining to start stream: no Dysuria: no Onset: gradual Severity: moderate  Functional Status Survey: Is the patient deaf or have difficulty hearing?: No Does the patient have difficulty seeing, even when wearing glasses/contacts?: No Does the patient have difficulty concentrating, remembering, or making decisions?: Yes Does the patient have difficulty walking or climbing stairs?: Yes Does the patient have difficulty dressing or bathing?: Yes Does the patient have difficulty doing errands alone such as visiting a doctor's office or shopping?: Yes  FALL RISK:    07/10/2021    8:41 AM 04/24/2021   11:02 AM 04/03/2021    6:00 PM 12/14/2020   10:15 AM 07/18/2020   10:20 AM  Fall Risk   Falls in the past year? 0 1 0 0 1  Number falls in past yr: 0 0 0 0 1  Injury with Fall? 0 0 0 0 0  Risk for fall due to :  Impaired balance/gait;Impaired mobility;History of fall(s) No Fall Risks Impaired balance/gait;Impaired mobility Impaired balance/gait  Follow up Falls evaluation completed;Education provided;Falls prevention discussed Falls evaluation completed Falls evaluation completed Falls evaluation completed    Past Medical History:  Past Medical History:  Diagnosis Date   Depression    Diabetes  mellitus without complication (HCC)    Diastolic dysfunction    a. 09/2020 Echo: EF 50-55%, no rwma, GrII DD. Nl RV size/fxn.  Mildly dil LA.   Hydrocephalus in adult [facility])    Hyperlipidemia    Hypertension    Seizures (HCC)    Stroke [facility])     Surgical History:  Past Surgical History:  Procedure Laterality Date   COLONOSCOPY WITH PROPOFOL N/A 03/08/2020   Procedure: COLONOSCOPY WITH PROPOFOL;  Surgeon: Pasty Spillers, MD;  Location: [facility] ENDOSCOPY;  Service: Endoscopy;  Laterality: N/A;  LOOP RECORDER IMPLANT  12/20/13   MDT LinQ implanted by Dr Ladona Ridgel for cryptogenic stroke   LOOP RECORDER IMPLANT N/A 12/20/2013   Procedure: LOOP RECORDER IMPLANT;  Surgeon: Marinus Maw, MD;  Location: [facility] CATH LAB;  Service: Cardiovascular;  Laterality: N/A;    Medications:  Current Outpatient Medications on File Prior to Visit  Medication Sig   acetaminophen (TYLENOL) 325 MG tablet Take 2 tablets (650 mg total) by mouth every 6 (six) hours as needed for mild pain.   Blood Pressure Monitoring (BLOOD PRESSURE MONITOR AUTOMAT) DEVI Use to monitor blood pressure once daily.   metFORMIN (GLUCOPHAGE-XR) 500 MG 24 hr tablet Take 2 tablets (1,000 mg total) by mouth 2 (two) times daily with a meal.   Multiple Vitamin (MULTIVITAMIN) tablet Take 1 tablet by mouth daily.   No current facility-administered medications on file prior to visit.    Allergies:  No Known Allergies  Social History:  Social History   Socioeconomic History   Marital status: Single    Spouse name: Not on file   Number of children: Not on file   Years of education: Not on file   Highest education level: Not on file  Occupational History   Not on file  Tobacco Use   Smoking status: Former   Smokeless tobacco: Never   Tobacco comments:    > 4 years quit  Vaping Use   Vaping Use: Never used  Substance and Sexual Activity   Alcohol use: Yes    Alcohol/week: 9.0 standard drinks of alcohol    Types: 7 Cans of beer, 2  Standard drinks or equivalent per week    Comment: in a week   Drug use: No   Sexual activity: Not Currently  Other Topics Concern   Not on file  Social History Narrative   Not on file   Social Determinants of Health   Financial Resource Strain: Low Risk  (07/12/2022)   Overall Financial Resource Strain (CARDIA)    Difficulty of Paying Living Expenses: Not hard at all  Food Insecurity: No Food Insecurity (07/12/2022)   Hunger Vital Sign    Worried About Running Out of Food in the Last Year: Never true    Ran Out of Food in the Last Year: Never true  Transportation Needs: No Transportation Needs (07/12/2022)   PRAPARE - Administrator, Civil Service (Medical): No    Lack of Transportation (Non-Medical): No  Physical Activity: Inactive (07/12/2022)   Exercise Vital Sign    Days of Exercise per Week: 0 days    Minutes of Exercise per Session: 0 min  Stress: No Stress Concern Present (07/12/2022)   [employer] of Occupational Health - Occupational Stress Questionnaire    Feeling of Stress : Only a little  Social Connections: Socially Isolated (07/12/2022)   Social Connection and Isolation Panel [NHANES]    Frequency of Communication with Friends and Family: Twice a week    Frequency of Social Gatherings with Friends and Family: Three times a week    Attends Religious Services: Never    Active Member of Clubs or Organizations: No    Attends Banker Meetings: Never    Marital Status: Never married  Intimate Partner Violence: Not At Risk (07/12/2022)   Humiliation, Afraid, Rape, and Kick questionnaire    Fear of Current or Ex-Partner: No    Emotionally Abused: No    Physically Abused: No    Sexually Abused: No   Social History  Tobacco Use  Smoking Status Former  Smokeless Tobacco Never  Tobacco Comments   > 4 years quit   Social History   Substance and Sexual Activity  Alcohol Use Yes   Alcohol/week: 9.0 standard drinks of alcohol   Types: 7 Cans of  beer, 2 Standard drinks or equivalent per week   Comment: in a week    Family History:  Family History  Problem Relation Age of Onset   Hypertension Mother    Diabetes Mother    Stroke Father    Heart disease Father    Hypertension Father    Stroke Brother    Arthritis Brother    Diabetes Maternal Grandfather     Past medical history, surgical history, medications, allergies, family history and social history reviewed with patient today and changes made to appropriate areas of the chart.   ROS All other ROS negative except what is listed above and in the HPI.      Objective:    BP 106/71   Pulse 69   Temp 98 F (36.7 C) (Oral)   Ht 6' 1.5" (1.867 m)   Wt 269 lb (122 kg)   SpO2 98%   BMI 35.01 kg/m   Wt Readings from Last 3 Encounters:  07/12/22 269 lb (122 kg)  04/10/22 294 lb (133.4 kg)  01/24/22 294 lb 1.5 oz (133.4 kg)    Physical Exam Vitals and nursing note reviewed.  Constitutional:      General: He is awake. He is not in acute distress.    Appearance: He is well-developed. He is obese. He is not ill-appearing.  HENT:     Head: Normocephalic and atraumatic.     Right Ear: Hearing normal. No drainage.     Left Ear: Hearing normal. No drainage.  Eyes:     General: Lids are normal.        Right eye: No discharge.        Left eye: No discharge.     Conjunctiva/sclera: Conjunctivae normal.     Pupils: Pupils are equal, round, and reactive to light.  Neck:     Thyroid: No thyromegaly.     Vascular: No carotid bruit.  Cardiovascular:     Rate and Rhythm: Normal rate and regular rhythm.     Heart sounds: Normal heart sounds, S1 normal and S2 normal. No murmur heard.    No gallop.  Pulmonary:     Effort: Pulmonary effort is normal. No accessory muscle usage or respiratory distress.     Breath sounds: Normal breath sounds.  Abdominal:     General: Bowel sounds are normal.     Palpations: Abdomen is soft.  Musculoskeletal:        General: Normal range of  motion.     Cervical back: Normal range of motion and neck supple.     Right lower leg: Edema (trace) present.     Left lower leg: Edema (trace) present.     Comments: Utilizing manual W/C today.  Skin:    General: Skin is warm and dry.     Capillary Refill: Capillary refill takes less than 2 seconds.  Neurological:     Mental Status: He is alert and oriented to person, place, and time.  Psychiatric:        Attention and Perception: Attention normal.        Mood and Affect: Mood normal.        Speech: Speech normal.  Behavior: Behavior normal. Behavior is cooperative.       07/12/2022    5:12 PM 04/03/2021    6:02 PM  6CIT Screen  What Year? 0 points 0 points  What month? 0 points 0 points  What time? 0 points 0 points  Count back from 20 0 points 2 points  Months in reverse 2 points 4 points  Repeat phrase 2 points 0 points  Total Score 4 points 6 points   Results for orders placed or performed in visit on 07/12/22  Bayer DCA Hb A1c Waived  Result Value Ref Range   HB A1C (BAYER DCA - WAIVED) 6.1 (H) 4.8 - 5.6 %      Assessment & Plan:   Problem List Items Addressed This Visit       Cardiovascular and Mediastinum   Cardiomyopathy ([place])    Stable, followed by cardiology, continue this collaboration.  Recommend they schedule follow-up, his caregiver (brother) has been out due to stroke and been unable to take him recently.      Relevant Medications   amLODipine (NORVASC) 10 MG tablet   atorvastatin (LIPITOR) 80 MG tablet   aspirin EC (ASPIR-LOW) 81 MG tablet   carvedilol (COREG) 6.25 MG tablet   lisinopril (ZESTRIL) 5 MG tablet   Other Relevant Orders   Comprehensive metabolic panel   Lipid Panel w/o Chol/HDL Ratio   Hypertension associated with diabetes (HCC)    Chronic, stable.  BP at goal in office today.  Continue current medication regimen and adjust as needed.  Recommend checking BP daily at home and documenting for provider. He has a BP cuff available  at his home. LABS: CBC, CMP, TSH.  Refills up to date.  Return in 3 months for f/u.      Relevant Medications   amLODipine (NORVASC) 10 MG tablet   atorvastatin (LIPITOR) 80 MG tablet   aspirin EC (ASPIR-LOW) 81 MG tablet   carvedilol (COREG) 6.25 MG tablet   lisinopril (ZESTRIL) 5 MG tablet   Other Relevant Orders   Bayer DCA Hb A1c Waived (Completed)   Comprehensive metabolic panel   CBC with Differential/Platelet   TSH   Microalbumin, Urine Waived     Endocrine   Hyperlipidemia associated with type 2 diabetes mellitus (HCC)    Chronic, ongoing.  Continue current medication regimen and adjust as needed.  Lipid panel today.  Refills up to date. Return in 3 months.      Relevant Medications   amLODipine (NORVASC) 10 MG tablet   atorvastatin (LIPITOR) 80 MG tablet   aspirin EC (ASPIR-LOW) 81 MG tablet   carvedilol (COREG) 6.25 MG tablet   lisinopril (ZESTRIL) 5 MG tablet   Other Relevant Orders   Bayer DCA Hb A1c Waived (Completed)   Comprehensive metabolic panel   Lipid Panel w/o Chol/HDL Ratio   Type 2 diabetes mellitus with diabetic polyneuropathy ([place])    Refer to diabetes with proteinuria plan of care.      Relevant Medications   atorvastatin (LIPITOR) 80 MG tablet   aspirin EC (ASPIR-LOW) 81 MG tablet   citalopram (CELEXA) 10 MG tablet   donepezil (ARICEPT) 5 MG tablet   gabapentin (NEURONTIN) 300 MG capsule   lisinopril (ZESTRIL) 5 MG tablet   Other Relevant Orders   Bayer DCA Hb A1c Waived (Completed)   Comprehensive metabolic panel   Microalbumin, Urine Waived   Type 2 diabetes mellitus with proteinuria (HCC)    Chronic, ongoing.  A1c 6.1% today, trend down.  Sent home with urine sample cup.  Continue Metformin XR 1000 MG BID, although may need to adjust dependent on incontinence issues.  Due to history of hypoglycemia, which he has had in past, will avoid restart of Glipizide at this time and if elevation consider SGLT or GLP add on -- would be cautious with  SGLT2 due to urinary incontinence at baseline. Continue Gabapentin for discomfort. Recommend he focus on diet, as still tends to snack a lot at home, and avoid alcohol use. Continue checking BS daily at home.  Return in 3 months.      Relevant Medications   atorvastatin (LIPITOR) 80 MG tablet   aspirin EC (ASPIR-LOW) 81 MG tablet   lisinopril (ZESTRIL) 5 MG tablet   Other Relevant Orders   Bayer DCA Hb A1c Waived (Completed)   Comprehensive metabolic panel   Microalbumin, Urine Waived     Nervous and Auditory   Chronic communicating hydrocephalus (HCC)    Ongoing, continue collaboration with neurology as needed and current memory medication as ordered by them in past.  Recommend they schedule follow-up with neurology, his caregiver (brother) has been out due to stroke and been unable to take him recently.      Relevant Orders   Comprehensive metabolic panel   Ambulatory referral to Home Health     Other   Full incontinence of feces    Ongoing issue, will try not to stop Metformin if able.  Referral back to GI for assessment and home health.      Relevant Orders   Ambulatory referral to Gastroenterology   History of stroke    Continue collaboration with neurology.  Discussed goals with patient LDL <70, A1c <6.5%, and BP <130/80.        Relevant Orders   Ambulatory referral to Home Health   Obesity    BMI 35.01 with T2DM, HTN.  Recommended eating smaller high protein, low fat meals more frequently and exercising 30 mins a day 5 times a week with a goal of 10-15lb weight loss in the next 3 months. Patient voiced their understanding and motivation to adhere to these recommendations.  Recommend continue cut back on alcohol.      Poor mobility    Referral for home health PT and OT for strengthening.      Urge incontinence of urine    Followed by urology, continue this collaboration and medications as prescribed by them.  Recent note reviewed.  Recommend they return for visit due  to ongoing issues, his caregiver (brother) has been out due to stroke and been unable to take him recently.      Relevant Medications   Vibegron (GEMTESA) 75 MG TABS   tamsulosin (FLOMAX) 0.4 MG CAPS capsule   Other Visit Diagnoses     Medicare annual wellness visit, subsequent    -  Primary   Medicare wellness due and performed today.       Discussed aspirin prophylaxis for myocardial infarction prevention and decision was made to continue ASA  LABORATORY TESTING:  Health maintenance labs ordered today as discussed above.   IMMUNIZATIONS:   - Tdap: Tetanus vaccination status reviewed: last tetanus booster within 10 years. - Influenza: Up to date - Pneumovax: Up to date - Prevnar: Not applicable - Zostavax vaccine: Not applicable  SCREENING: - Colonoscopy: Up to date  Discussed with patient purpose of the colonoscopy is to detect colon cancer at curable precancerous or early stages   - AAA Screening: Not applicable  -Hearing  Test: Not applicable  -Spirometry: Not applicable   PATIENT COUNSELING:    Sexuality: Discussed sexually transmitted diseases, partner selection, use of condoms, avoidance of unintended pregnancy  and contraceptive alternatives.   Advised to avoid cigarette smoking.  I discussed with the patient that most people either abstain from alcohol or drink within safe limits (<=14/week and <=4 drinks/occasion for males, <=7/weeks and <= 3 drinks/occasion for females) and that the risk for alcohol disorders and other health effects rises proportionally with the number of drinks per week and how often a drinker exceeds daily limits.  Discussed cessation/primary prevention of drug use and availability of treatment for abuse.   Diet: Encouraged to adjust caloric intake to maintain  or achieve ideal body weight, to reduce intake of dietary saturated fat and total fat, to limit sodium intake by avoiding high sodium foods and not adding table salt, and to maintain  adequate dietary potassium and calcium preferably from fresh fruits, vegetables, and low-fat dairy products.    Stressed the importance of regular exercise  Injury prevention: Discussed safety belts, safety helmets, smoke detector, smoking near bedding or upholstery.   Dental health: Discussed importance of regular tooth brushing, flossing, and dental visits.   Follow up plan: NEXT PREVENTATIVE PHYSICAL DUE IN 1 YEAR. Return in about 3 months (around 10/11/2022) for T2DM, HTN/HLD, MOOD, H/O CVA.

## 2022-07-12 NOTE — Progress Notes (Deleted)
BP 106/71   Pulse 69   Temp 98 F (36.7 C) (Oral)   Ht 6' 1.5" (1.867 m)   Wt 269 lb (122 kg)   SpO2 98%   BMI 35.01 kg/m    Subjective:    Patient ID: [name], male    DOB: 1971/04/29, 52 y.o.   MRN: [id number]  HPI: [name] is a 52 y.o. male  Chief Complaint  Patient presents with   Diabetes   Hypertension   Hyperlipidemia   Urinary Incontinence   DIABETES Continues on Metformin XR 1000 MG daily with last A1c 8.6% in October, recommended he increase his Metformin XR to 1000 MG BID. When went up on Metformin had decreased appetite.    He reports drinking about 2 beers a day, reduction.  History of B12 deficiency, recent levels improved with supplement.    Hypoglycemic episodes:no Polydipsia/polyuria: no Visual disturbance: no Chest pain: no Paresthesias: no Glucose Monitoring: yes             Accucheck frequency: not checking             Fasting glucose: none              Post prandial:             Evening:             Before meals: Taking Insulin?: no             Long acting insulin:             Short acting insulin: Blood Pressure Monitoring: daily Retinal Examination: Up To Date -- last in September 2022 at [place] Exam: Up to Date Pneumovax: Up to Date Influenza: up to date Aspirin: yes   HYPERTENSION / HYPERLIPIDEMIA Continues on Amlodipine 10 MG, ASA, Plavix, Carvedilol 6.25 MG BID, Lisinopril 5 MG QDAY, and Lipitor 80 MG daily.  Saw cardiology last 11/06/21.  History of CVA, continues on Aricept for memory changes.  Has underlying chronic communicating hydrocephalus, no recent visit with neurology, have recommended her return -- last was 06/20/2020.  Has not seen GI since 07/06/2020 -- went initially for stool incontinence, his brother reports today this is worsening and would like to return to GI.  Has incontinence with every bowel movement.  Brother tries to keep him on bowel routine.  Has underlying issues with mobility due to past  CVA. Satisfied with current treatment? yes Duration of hypertension: chronic BP monitoring frequency: rarely BP range: on average <130/80 BP medication side effects: no Duration of hyperlipidemia: chronic Cholesterol medication side effects: no Cholesterol supplements: none Medication compliance: good compliance Aspirin: yes Recent stressors: no Recurrent headaches: no Visual changes: no Palpitations: no Dyspnea: no Chest pain: no Lower extremity edema: no Dizzy/lightheaded: no  The ASCVD Risk score (Arnett DK, et al., 2019) failed to calculate for the following reasons:   The patient has a prior MI or stroke diagnosis  DEPRESSION Continues on Celexa 10 MG daily. Mood status: stable Satisfied with current treatment?: yes Symptom severity: moderate  Duration of current treatment : chronic Side effects: no Medication compliance: good compliance Psychotherapy/counseling: in the past Depressed mood:none Anxious mood: no Anhedonia: no Significant weight loss or gain: no Insomnia: none Fatigue: no Feelings of worthlessness or guilt: no Impaired concentration/indecisiveness: no Suicidal ideations: no Hopelessness: no Crying spells: no    09/10/2021    2:51 PM 07/10/2021    9:14 AM 04/24/2021   11:05 AM 04/24/2021  11:04 AM 04/03/2021    5:54 PM  Depression screen PHQ 2/9  Decreased Interest 0 0 0 0 0  Down, Depressed, Hopeless 0 0 0 0 0  PHQ - 2 Score 0 0 0 0 0  Altered sleeping 0  0    Tired, decreased energy 0  0    Change in appetite 0  0    Feeling bad or failure about yourself  0  0    Trouble concentrating 0  1    Moving slowly or fidgety/restless 0  0    Suicidal thoughts 0  0    PHQ-9 Score 0  1    Difficult doing work/chores Not difficult at all  Not difficult at all         04/24/2021   11:07 AM 10/01/2019   12:11 PM  GAD 7 : Generalized Anxiety Score  Nervous, Anxious, on Edge 0 0  Control/stop worrying 0 0  Worry too much - different things 0 0   Trouble relaxing 0 0  Restless 0 0  Easily annoyed or irritable 0 0  Afraid - awful might happen 0 0  Total GAD 7 Score 0 0  Anxiety Difficulty Not difficult at all    URINARY INCONTINENCE Followed by urology, 11/01/21 with Dr. Matilde Sprang.  He is currently taking Gemtesa and Flomax.  They are considering PTN stimulation if ongoing issues.  Out of Gemtesa. Status: uncontrolled Satisfied with current treatment?: yes Medication side effects: no Medication compliance: good compliance Duration: chronic Nocturia: 2-3x per night Urinary frequency:yes Incomplete voiding: no Urgency: yes Weak urinary stream: no Straining to start stream: no Dysuria: no Onset: gradual Severity: moderate  Relevant past medical, surgical, family and social history reviewed and updated as indicated. Interim medical history since our last visit reviewed. Allergies and medications reviewed and updated.  Review of Systems  Constitutional:  Negative for activity change, diaphoresis, fatigue and fever.  Respiratory:  Negative for cough, chest tightness, shortness of breath and wheezing.   Cardiovascular:  Negative for chest pain, palpitations and leg swelling.  Gastrointestinal:  Negative for abdominal distention, abdominal pain, constipation, diarrhea, nausea and vomiting.  Endocrine: Negative for cold intolerance, heat intolerance, polydipsia, polyphagia and polyuria.  Neurological:  Negative for dizziness, syncope, weakness, light-headedness, numbness and headaches.  Psychiatric/Behavioral: Negative.      Per HPI unless specifically indicated above     Objective:    BP 106/71   Pulse 69   Temp 98 F (36.7 C) (Oral)   Ht 6' 1.5" (1.867 m)   Wt 269 lb (122 kg)   SpO2 98%   BMI 35.01 kg/m   Wt Readings from Last 3 Encounters:  07/12/22 269 lb (122 kg)  04/10/22 294 lb (133.4 kg)  01/24/22 294 lb 1.5 oz (133.4 kg)    Physical Exam Vitals and nursing note reviewed.  Constitutional:       General: He is awake. He is not in acute distress.    Appearance: He is well-developed. He is obese. He is not ill-appearing.  HENT:     Head: Normocephalic and atraumatic.     Right Ear: Hearing normal. No drainage.     Left Ear: Hearing normal. No drainage.  Eyes:     General: Lids are normal.        Right eye: No discharge.        Left eye: No discharge.     Conjunctiva/sclera: Conjunctivae normal.     Pupils: Pupils are equal, round,  and reactive to light.  Neck:     Thyroid: No thyromegaly.     Vascular: No carotid bruit.  Cardiovascular:     Rate and Rhythm: Normal rate and regular rhythm.     Heart sounds: Normal heart sounds, S1 normal and S2 normal. No murmur heard.    No gallop.  Pulmonary:     Effort: Pulmonary effort is normal. No accessory muscle usage or respiratory distress.     Breath sounds: Normal breath sounds.  Abdominal:     General: Bowel sounds are normal.     Palpations: Abdomen is soft.  Musculoskeletal:        General: Normal range of motion.     Cervical back: Normal range of motion and neck supple.     Right lower leg: Edema (trace) present.     Left lower leg: Edema (trace) present.     Comments: Utilizing regular W/C today.  Skin:    General: Skin is warm and dry.     Capillary Refill: Capillary refill takes less than 2 seconds.  Neurological:     Mental Status: He is alert and oriented to person, place, and time.  Psychiatric:        Attention and Perception: Attention normal.        Mood and Affect: Mood normal.        Speech: Speech normal.        Behavior: Behavior normal. Behavior is cooperative.    Results for orders placed or performed in visit on 04/10/22  Bayer DCA Hb A1c Waived  Result Value Ref Range   HB A1C (BAYER DCA - WAIVED) 8.6 (H) 4.8 - 5.6 %  Comprehensive metabolic panel  Result Value Ref Range   Glucose 219 (H) 70 - 99 mg/dL   BUN 14 6 - 24 mg/dL   Creatinine, Ser 5.27 (H) 0.76 - 1.27 mg/dL   eGFR 64 >78  EU/MPN/3.61   BUN/Creatinine Ratio 10 9 - 20   Sodium 137 134 - 144 mmol/L   Potassium 4.2 3.5 - 5.2 mmol/L   Chloride 100 96 - 106 mmol/L   CO2 22 20 - 29 mmol/L   Calcium 9.4 8.7 - 10.2 mg/dL   Total Protein 7.2 6.0 - 8.5 g/dL   Albumin 4.5 4.1 - 5.1 g/dL   Globulin, Total 2.7 1.5 - 4.5 g/dL   Albumin/Globulin Ratio 1.7 1.2 - 2.2   Bilirubin Total 0.5 0.0 - 1.2 mg/dL   Alkaline Phosphatase 97 44 - 121 IU/L   AST 9 0 - 40 IU/L   ALT 10 0 - 44 IU/L  Lipid Panel w/o Chol/HDL Ratio  Result Value Ref Range   Cholesterol, Total 128 100 - 199 mg/dL   Triglycerides 69 0 - 149 mg/dL   HDL 49 >44 mg/dL   VLDL Cholesterol Cal 14 5 - 40 mg/dL   LDL Chol Calc (NIH) 65 0 - 99 mg/dL  Vitamin R15  Result Value Ref Range   Vitamin B-12 1,132 232 - 1,245 pg/mL      Assessment & Plan:   Problem List Items Addressed This Visit       Cardiovascular and Mediastinum   Cardiomyopathy (HCC)   Relevant Orders   Comprehensive metabolic panel   Lipid Panel w/o Chol/HDL Ratio   Hypertension associated with diabetes (HCC)   Relevant Orders   Bayer DCA Hb A1c Waived   Microalbumin, Urine Waived   Comprehensive metabolic panel   CBC with Differential/Platelet   TSH  Endocrine   Hyperlipidemia associated with type 2 diabetes mellitus (HCC)   Relevant Orders   Bayer DCA Hb A1c Waived   Comprehensive metabolic panel   Lipid Panel w/o Chol/HDL Ratio   Type 2 diabetes mellitus with diabetic polyneuropathy (HCC)   Relevant Orders   Bayer DCA Hb A1c Waived   Microalbumin, Urine Waived   Comprehensive metabolic panel   Type 2 diabetes mellitus with proteinuria (HCC) - Primary   Relevant Orders   Bayer DCA Hb A1c Waived   Microalbumin, Urine Waived   Comprehensive metabolic panel     Nervous and Auditory   Chronic communicating hydrocephalus (HCC)   Relevant Orders   Comprehensive metabolic panel     Other   History of stroke   Obesity   Urge incontinence of urine     Follow up  plan: No follow-ups on file.

## 2022-07-12 NOTE — Assessment & Plan Note (Signed)
Followed by urology, continue this collaboration and medications as prescribed by them.  Recent note reviewed.  Recommend they return for visit due to ongoing issues, his caregiver (brother) has been out due to stroke and been unable to take him recently.

## 2022-07-12 NOTE — Assessment & Plan Note (Signed)
Chronic, stable.  BP at goal in office today.  Continue current medication regimen and adjust as needed.  Recommend checking BP daily at home and documenting for provider. He has a BP cuff available at his home. LABS: CBC, CMP, TSH.  Refills up to date.  Return in 3 months for f/u.

## 2022-07-12 NOTE — Assessment & Plan Note (Addendum)
Chronic, ongoing.  A1c 6.1% today, trend down.  Sent home with urine sample cup.  Continue Metformin XR 1000 MG BID, although may need to adjust dependent on incontinence issues.  Due to history of hypoglycemia, which he has had in past, will avoid restart of Glipizide at this time and if elevation consider SGLT or GLP add on -- would be cautious with SGLT2 due to urinary incontinence at baseline. Continue Gabapentin for discomfort. Recommend he focus on diet, as still tends to snack a lot at home, and avoid alcohol use. Continue checking BS daily at home.  Return in 3 months.

## 2022-07-12 NOTE — Assessment & Plan Note (Signed)
Ongoing issue, will try not to stop Metformin if able.  Referral back to GI for assessment and home health.

## 2022-07-12 NOTE — Assessment & Plan Note (Signed)
Ongoing, continue collaboration with neurology as needed and current memory medication as ordered by them in past.  Recommend they schedule follow-up with neurology, his caregiver (brother) has been out due to stroke and been unable to take him recently.

## 2022-07-12 NOTE — Assessment & Plan Note (Signed)
Referral for home health PT and OT for strengthening.

## 2022-07-12 NOTE — Assessment & Plan Note (Signed)
BMI 35.01 with T2DM, HTN.  Recommended eating smaller high protein, low fat meals more frequently and exercising 30 mins a day 5 times a week with a goal of 10-15lb weight loss in the next 3 months. Patient voiced their understanding and motivation to adhere to these recommendations.  Recommend continue cut back on alcohol.

## 2022-07-12 NOTE — Assessment & Plan Note (Signed)
Refer to diabetes with proteinuria plan of care. 

## 2022-07-12 NOTE — Assessment & Plan Note (Signed)
Chronic, ongoing.  Continue current medication regimen and adjust as needed.  Lipid panel today.  Refills up to date. Return in 3 months.

## 2022-07-12 NOTE — Assessment & Plan Note (Signed)
Continue collaboration with neurology.  Discussed goals with patient LDL <70, A1c <6.5%, and BP <130/80.

## 2022-07-12 NOTE — Assessment & Plan Note (Signed)
Stable, followed by cardiology, continue this collaboration.  Recommend they schedule follow-up, his caregiver (brother) has been out due to stroke and been unable to take him recently.

## 2022-07-13 LAB — CBC WITH DIFFERENTIAL/PLATELET
Basophils Absolute: 0 10*3/uL (ref 0.0–0.2)
Basos: 1 %
EOS (ABSOLUTE): 0.4 10*3/uL (ref 0.0–0.4)
Eos: 9 %
Hematocrit: 38.7 % (ref 37.5–51.0)
Hemoglobin: 13.3 g/dL (ref 13.0–17.7)
Immature Grans (Abs): 0 10*3/uL (ref 0.0–0.1)
Immature Granulocytes: 0 %
Lymphocytes Absolute: 1.4 10*3/uL (ref 0.7–3.1)
Lymphs: 35 %
MCH: 30.2 pg (ref 26.6–33.0)
MCHC: 34.4 g/dL (ref 31.5–35.7)
MCV: 88 fL (ref 79–97)
Monocytes Absolute: 0.4 10*3/uL (ref 0.1–0.9)
Monocytes: 9 %
Neutrophils Absolute: 1.9 10*3/uL (ref 1.4–7.0)
Neutrophils: 46 %
Platelets: 265 10*3/uL (ref 150–450)
RBC: 4.4 x10E6/uL (ref 4.14–5.80)
RDW: 13.1 % (ref 11.6–15.4)
WBC: 4.1 10*3/uL (ref 3.4–10.8)

## 2022-07-13 LAB — COMPREHENSIVE METABOLIC PANEL
ALT: 11 IU/L (ref 0–44)
AST: 10 IU/L (ref 0–40)
Albumin/Globulin Ratio: 1.7 (ref 1.2–2.2)
Albumin: 4.4 g/dL (ref 4.1–5.1)
Alkaline Phosphatase: 78 IU/L (ref 44–121)
BUN/Creatinine Ratio: 13 (ref 9–20)
BUN: 15 mg/dL (ref 6–24)
Bilirubin Total: 0.8 mg/dL (ref 0.0–1.2)
CO2: 19 mmol/L — ABNORMAL LOW (ref 20–29)
Calcium: 9.5 mg/dL (ref 8.7–10.2)
Chloride: 104 mmol/L (ref 96–106)
Creatinine, Ser: 1.15 mg/dL (ref 0.76–1.27)
Globulin, Total: 2.6 g/dL (ref 1.5–4.5)
Glucose: 81 mg/dL (ref 70–99)
Potassium: 4.1 mmol/L (ref 3.5–5.2)
Sodium: 144 mmol/L (ref 134–144)
Total Protein: 7 g/dL (ref 6.0–8.5)
eGFR: 78 mL/min/{1.73_m2} (ref >59)

## 2022-07-13 LAB — LIPID PANEL W/O CHOL/HDL RATIO
Cholesterol, Total: 105 mg/dL (ref 100–199)
HDL: 49 mg/dL (ref >39)
LDL Chol Calc (NIH): 42 mg/dL (ref 0–99)
Triglycerides: 61 mg/dL (ref 0–149)
VLDL Cholesterol Cal: 14 mg/dL (ref 5–40)

## 2022-07-13 LAB — TSH: TSH: 0.613 u[IU]/mL (ref 0.450–4.500)

## 2022-07-13 NOTE — Progress Notes (Signed)
Contacted via MyChart   Good evening Anup, your labs have returned and remain stable, no changes needed.  Have a great day!! Keep being amazing!!  Thank you for allowing me to participate in your care.  I appreciate you. Kindest regards, Camdynn Maranto

## 2022-07-23 ENCOUNTER — Telehealth: Payer: Self-pay

## 2022-07-23 NOTE — Telephone Encounter (Signed)
Paperwork faxed back [employer]

## 2022-07-25 ENCOUNTER — Telehealth: Payer: Self-pay | Admitting: Nurse Practitioner

## 2022-07-25 NOTE — Telephone Encounter (Signed)
Spoke with [facility] and informed her that he was not given a new med for appetite to take bid

## 2022-07-25 NOTE — Telephone Encounter (Signed)
Mandy at [employer] called saying pt ask her about a new medication that he just got an increase in to take twice a day.  Pt does not know the name of the rx.  She is wanting to know if the provider could help her out.  CB#  [phone]

## 2022-07-31 ENCOUNTER — Telehealth: Payer: Self-pay

## 2022-07-31 NOTE — Telephone Encounter (Signed)
Paperwork faxed back to [employer]

## 2022-07-31 NOTE — Telephone Encounter (Signed)
Paperwork faxed back to [facility]  

## 2022-08-05 ENCOUNTER — Telehealth: Payer: Self-pay

## 2022-08-05 NOTE — Telephone Encounter (Signed)
Paperwork faxed back to [facility]  

## 2022-08-06 ENCOUNTER — Telehealth: Payer: Self-pay | Admitting: Nurse Practitioner

## 2022-08-06 NOTE — Telephone Encounter (Signed)
Verbal orders given to Santiago Glad at [facility] with Kersey per [facility] for patient

## 2022-08-06 NOTE — Telephone Encounter (Signed)
Home Health Verbal Orders - Caller/Agency: Santiago Glad with [place] Number: [phone]  Requesting OT/PT/Skilled Nursing/Social Work/Speech Therapy: Speech therapy eval

## 2022-08-08 ENCOUNTER — Ambulatory Visit: Payer: Medicare Other | Admitting: Gastroenterology

## 2022-08-13 ENCOUNTER — Telehealth: Payer: Self-pay | Admitting: Nurse Practitioner

## 2022-08-13 NOTE — Telephone Encounter (Signed)
Home Health Verbal Orders - Caller/Agency: Glory Buff ST  Callback Number: [phone] Requesting OT/PT/Skilled Nursing/Social Work/Speech Therapy: ST  Frequency:   1w5

## 2022-08-14 NOTE — Telephone Encounter (Signed)
Left message for [name] to return call for verbal orders

## 2022-08-15 NOTE — Telephone Encounter (Signed)
Verbal orders given per [name] to Lourena Simmonds. Santiago Glad Verbalized understanding

## 2022-08-19 ENCOUNTER — Telehealth: Payer: Self-pay | Admitting: Nurse Practitioner

## 2022-08-19 NOTE — Telephone Encounter (Signed)
Copied from [place] [id number]. Topic: Medicare AWV >> Aug 19, 2022  2:05 PM Devoria Glassing wrote: Reason for CRM: Called patient to schedule Annual Wellness Visit.  Please schedule with Health Nurse Advisor Kirke Shaggy at [facility].Call [place] at [phone]

## 2022-08-19 NOTE — Telephone Encounter (Signed)
Copied from [place]. Topic: Medicare AWV >> Aug 19, 2022  2:04 PM Devoria Glassing wrote: Reason for CRM: Left message for patient to schedule Annual Wellness Visit.  Please schedule with Health Nurse Advisor Kirke Shaggy at [facility].Call [place] at [phone]

## 2022-08-22 ENCOUNTER — Telehealth: Payer: Self-pay

## 2022-08-22 NOTE — Telephone Encounter (Signed)
Paperwork faxed back to [facility]

## 2022-08-22 NOTE — Telephone Encounter (Signed)
Paperwork faxed back to [facility]

## 2022-08-29 ENCOUNTER — Telehealth: Payer: Self-pay

## 2022-08-29 NOTE — Telephone Encounter (Signed)
Paperwork faxed back to [facility]

## 2022-09-02 ENCOUNTER — Telehealth: Payer: Self-pay

## 2022-09-02 NOTE — Telephone Encounter (Signed)
Paperwork faxed back to [facility]

## 2022-09-03 ENCOUNTER — Telehealth: Payer: Self-pay | Admitting: Nurse Practitioner

## 2022-09-03 NOTE — Telephone Encounter (Signed)
Please follow up with Patient's brother [name] regarding the [employer].

## 2022-09-03 NOTE — Telephone Encounter (Signed)
Home health nurse is calling on behalf patient. Patient is having difficulty pricking his finger to test blood sugar. Patient wants to know if he qualifies for the [facility] and if he does, will he be educated on how to use the machine. Please advise.

## 2022-09-03 NOTE — Telephone Encounter (Signed)
Called patient's brother, No answer. HIPAA compliant vm left requesting return call.

## 2022-09-03 NOTE — Telephone Encounter (Signed)
[facility] Nurse is calling back wanting to know if it will be covered under Medicaid. Pt had a stroke and it is taking him 10 min to try and put the test strip in the machine to check his blood.  [name] would like for PCP to give her a call back to discuss.

## 2022-09-11 ENCOUNTER — Encounter: Payer: Self-pay | Admitting: Nurse Practitioner

## 2022-09-16 ENCOUNTER — Telehealth: Payer: Self-pay

## 2022-09-16 NOTE — Telephone Encounter (Signed)
Copied from [place] [id number]. Topic: Quick Communication - Home Health Verbal Orders >> Sep 16, 2022  2:22 PM Marcellus Scott wrote: Caller/Agency: Lourena Simmonds / [place] Number: [phone] Requesting OT/PT/Skilled Nursing/Social Work/Speech Therapy: Speech Therapy Frequency: 1w6

## 2022-09-16 NOTE — Telephone Encounter (Signed)
Verbal order given per [name]/.

## 2022-10-11 ENCOUNTER — Ambulatory Visit: Payer: Medicare Other | Admitting: Nurse Practitioner

## 2022-10-14 ENCOUNTER — Ambulatory Visit: Payer: Medicare Other | Admitting: Nurse Practitioner

## 2022-10-14 DIAGNOSIS — R809 Proteinuria, unspecified: Secondary | ICD-10-CM

## 2022-10-14 DIAGNOSIS — E1142 Type 2 diabetes mellitus with diabetic polyneuropathy: Secondary | ICD-10-CM

## 2022-10-14 DIAGNOSIS — I429 Cardiomyopathy, unspecified: Secondary | ICD-10-CM

## 2022-10-14 DIAGNOSIS — Z8673 Personal history of transient ischemic attack (TIA), and cerebral infarction without residual deficits: Secondary | ICD-10-CM

## 2022-10-14 DIAGNOSIS — E1169 Type 2 diabetes mellitus with other specified complication: Secondary | ICD-10-CM

## 2022-10-14 DIAGNOSIS — G910 Communicating hydrocephalus: Secondary | ICD-10-CM

## 2022-10-14 DIAGNOSIS — E1159 Type 2 diabetes mellitus with other circulatory complications: Secondary | ICD-10-CM

## 2022-10-21 ENCOUNTER — Telehealth: Payer: Self-pay | Admitting: Nurse Practitioner

## 2022-10-21 NOTE — Telephone Encounter (Signed)
[name] with [facility] called saying pt reported two falls last week.  He was not using his walker,  There were no injuries.  CB@  [phone]

## 2022-12-29 NOTE — Patient Instructions (Signed)
Diabetes Mellitus Basics  Diabetes mellitus, or diabetes, is a long-term (chronic) disease. It occurs when the body does not properly use sugar (glucose) that is released from food after you eat. Diabetes mellitus may be caused by one or both of these problems: Your pancreas does not make enough of a hormone called insulin. Your body does not react in a normal way to the insulin that it makes. Insulin lets glucose enter cells in your body. This gives you energy. If you have diabetes, glucose cannot get into cells. This causes high blood glucose (hyperglycemia). How to treat and manage diabetes You may need to take insulin or other diabetes medicines daily to keep your glucose in balance. If you are prescribed insulin, you will learn how to give yourself insulin by injection. You may need to adjust the amount of insulin you take based on the foods that you eat. You will need to check your blood glucose levels using a glucose monitor as told by your health care provider. The readings can help determine if you have low or high blood glucose. Generally, you should have these blood glucose levels: Before meals (preprandial): 80-130 mg/dL (4.4-7.2 mmol/L). After meals (postprandial): below 180 mg/dL (10 mmol/L). Hemoglobin A1c (HbA1c) level: less than 7%. Your health care provider will set treatment goals for you. Keep all follow-up visits. This is important. Follow these instructions at home: Diabetes medicines Take your diabetes medicines every day as told by your health care provider. List your diabetes medicines here: Name of medicine: ______________________________ Amount (dose): _______________ Time (a.m./p.m.): _______________ Notes: ___________________________________ Name of medicine: ______________________________ Amount (dose): _______________ Time (a.m./p.m.): _______________ Notes: ___________________________________ Name of medicine: ______________________________ Amount (dose):  _______________ Time (a.m./p.m.): _______________ Notes: ___________________________________ Insulin If you use insulin, list the types of insulin you use here: Insulin type: ______________________________ Amount (dose): _______________ Time (a.m./p.m.): _______________Notes: ___________________________________ Insulin type: ______________________________ Amount (dose): _______________ Time (a.m./p.m.): _______________ Notes: ___________________________________ Insulin type: ______________________________ Amount (dose): _______________ Time (a.m./p.m.): _______________ Notes: ___________________________________ Insulin type: ______________________________ Amount (dose): _______________ Time (a.m./p.m.): _______________ Notes: ___________________________________ Insulin type: ______________________________ Amount (dose): _______________ Time (a.m./p.m.): _______________ Notes: ___________________________________ Managing blood glucose  Check your blood glucose levels using a glucose monitor as told by your health care provider. Write down the times that you check your glucose levels here: Time: _______________ Notes: ___________________________________ Time: _______________ Notes: ___________________________________ Time: _______________ Notes: ___________________________________ Time: _______________ Notes: ___________________________________ Time: _______________ Notes: ___________________________________ Time: _______________ Notes: ___________________________________  Low blood glucose Low blood glucose (hypoglycemia) is when glucose is at or below 70 mg/dL (3.9 mmol/L). Symptoms may include: Feeling: Hungry. Sweaty and clammy. Irritable or easily upset. Dizzy. Sleepy. Having: A fast heartbeat. A headache. A change in your vision. Numbness around the mouth, lips, or tongue. Having trouble with: Moving (coordination). Sleeping. Treating low blood glucose To treat low blood  glucose, eat or drink something containing sugar right away. If you can think clearly and swallow safely, follow the 15:15 rule: Take 15 grams of a fast-acting carb (carbohydrate), as told by your health care provider. Some fast-acting carbs are: Glucose tablets: take 3-4 tablets. Hard candy: eat 3-5 pieces. Fruit juice: drink 4 oz (120 mL). Regular (not diet) soda: drink 4-6 oz (120-180 mL). Honey or sugar: eat 1 Tbsp (15 mL). Check your blood glucose levels 15 minutes after you take the carb. If your glucose is still at or below 70 mg/dL (3.9 mmol/L), take 15 grams of a carb again. If your glucose does not go above 70 mg/dL (3.9 mmol/L) after   3 tries, get help right away. After your glucose goes back to normal, eat a meal or a snack within 1 hour. Treating very low blood glucose If your glucose is at or below 54 mg/dL (3 mmol/L), you have very low blood glucose (severe hypoglycemia). This is an emergency. Do not wait to see if the symptoms will go away. Get medical help right away. Call your local emergency services (911 in the [place] Do not drive yourself to the hospital. Questions to ask your health care provider Should I talk with a diabetes educator? What equipment will I need to care for myself at home? What diabetes medicines do I need? When should I take them? How often do I need to check my blood glucose levels? What number can I call if I have questions? When is my follow-up visit? Where can I find a support group for people with diabetes? Where to find more information American Diabetes Association: www.diabetes.org Association of Diabetes Care and Education Specialists: www.diabeteseducator.org Contact a health care provider if: Your blood glucose is at or above 240 mg/dL (13.3 mmol/L) for 2 days in a row. You have been sick or have had a fever for 2 days or more, and you are not getting better. You have any of these problems for more than 6 hours: You cannot eat or  drink. You feel nauseous. You vomit. You have diarrhea. Get help right away if: Your blood glucose is lower than 54 mg/dL (3 mmol/L). You get confused. You have trouble thinking clearly. You have trouble breathing. These symptoms may represent a serious problem that is an emergency. Do not wait to see if the symptoms will go away. Get medical help right away. Call your local emergency services (911 in the [place] Do not drive yourself to the hospital. Summary Diabetes mellitus is a chronic disease that occurs when the body does not properly use sugar (glucose) that is released from food after you eat. Take insulin and diabetes medicines as told. Check your blood glucose every day, as often as told. Keep all follow-up visits. This is important. This information is not intended to replace advice given to you by your health care provider. Make sure you discuss any questions you have with your health care provider. Document Revised: 10/26/2019 Document Reviewed: 10/26/2019 Elsevier Patient Education  2024 Elsevier Inc.  

## 2022-12-30 ENCOUNTER — Ambulatory Visit (INDEPENDENT_AMBULATORY_CARE_PROVIDER_SITE_OTHER): Payer: Medicare Other | Admitting: Nurse Practitioner

## 2022-12-30 ENCOUNTER — Encounter: Payer: Self-pay | Admitting: Nurse Practitioner

## 2022-12-30 VITALS — BP 109/72 | HR 69 | Temp 98.3°F | Ht 73.5 in | Wt 253.0 lb

## 2022-12-30 DIAGNOSIS — E1129 Type 2 diabetes mellitus with other diabetic kidney complication: Secondary | ICD-10-CM | POA: Diagnosis not present

## 2022-12-30 DIAGNOSIS — N3941 Urge incontinence: Secondary | ICD-10-CM

## 2022-12-30 DIAGNOSIS — E1169 Type 2 diabetes mellitus with other specified complication: Secondary | ICD-10-CM | POA: Diagnosis not present

## 2022-12-30 DIAGNOSIS — N400 Enlarged prostate without lower urinary tract symptoms: Secondary | ICD-10-CM

## 2022-12-30 DIAGNOSIS — I152 Hypertension secondary to endocrine disorders: Secondary | ICD-10-CM

## 2022-12-30 DIAGNOSIS — I429 Cardiomyopathy, unspecified: Secondary | ICD-10-CM

## 2022-12-30 DIAGNOSIS — G910 Communicating hydrocephalus: Secondary | ICD-10-CM

## 2022-12-30 DIAGNOSIS — Z8673 Personal history of transient ischemic attack (TIA), and cerebral infarction without residual deficits: Secondary | ICD-10-CM

## 2022-12-30 DIAGNOSIS — E1142 Type 2 diabetes mellitus with diabetic polyneuropathy: Secondary | ICD-10-CM | POA: Diagnosis not present

## 2022-12-30 DIAGNOSIS — E1159 Type 2 diabetes mellitus with other circulatory complications: Secondary | ICD-10-CM | POA: Diagnosis not present

## 2022-12-30 DIAGNOSIS — R809 Proteinuria, unspecified: Secondary | ICD-10-CM

## 2022-12-30 DIAGNOSIS — Z7409 Other reduced mobility: Secondary | ICD-10-CM

## 2022-12-30 NOTE — Assessment & Plan Note (Signed)
Chronic, ongoing.  A1c 6.1% last visit, trend down -- recheck today.  Urine ALB sample obtained.  Continue Metformin XR 1000 MG BID, although may need to adjust dependent on incontinence issues.  Due to history of hypoglycemia, which he has had in past, will avoid restart of Glipizide at this time and if elevation consider SGLT or GLP1 add on -- would be cautious with SGLT2 due to urinary incontinence at baseline. Continue Gabapentin for discomfort. Recommend he focus on diet, as still tends to snack a lot at home, and avoid alcohol use. Continue checking BS daily at home.  Return in 3 months. - Statin and ACE on board - Vaccinations up to date - Recommend he schedule eye exam.  Foot exam up to date.

## 2022-12-30 NOTE — Assessment & Plan Note (Signed)
Chronic, ongoing due to past CVA.  Continue use of walker and W/C as needed.  Will complete FL2 form for assisted living, which may be beneficial for patient.

## 2022-12-30 NOTE — Assessment & Plan Note (Signed)
Followed by urology, continue this collaboration and medications as prescribed by them.  Recent note reviewed.  He reports improved symptoms.  Monitor closely.  PSA on labs today.

## 2022-12-30 NOTE — Assessment & Plan Note (Signed)
Continue collaboration with neurology.  Discussed goals with patient LDL <70, A1c <6.5%, and BP <130/80.   

## 2022-12-30 NOTE — Assessment & Plan Note (Signed)
Ongoing, continue collaboration with neurology as needed and current memory medication as ordered by them in past.  Recommend they schedule follow-up with neurology, his caregiver (brother) has been out due to stroke and been unable to take him recently. 

## 2022-12-30 NOTE — Assessment & Plan Note (Signed)
Stable, followed by cardiology, continue this collaboration.  Recommend they schedule follow-up, his caregiver (brother) has been out due to stroke and been unable to take him recently. 

## 2022-12-30 NOTE — Assessment & Plan Note (Signed)
Chronic, stable.  BP at goal in office today for stroke prevention.  Continue current medication regimen and adjust as needed.  Recommend checking BP daily at home and documenting for provider. He has a BP cuff available at his home. LABS: CMP and urine ALB.  Refills up to date.  Return in 3 months for f/u.

## 2022-12-30 NOTE — Assessment & Plan Note (Deleted)
Chronic, stable. Recheck level today. Continue daily supplement which is benefiting neuropathy. 

## 2022-12-30 NOTE — Assessment & Plan Note (Signed)
BMI 32.92 with T2DM, HTN.  Has lost 16 pounds with diet changes and alcohol reduction, praised for this.  Recommended eating smaller high protein, low fat meals more frequently and exercising 30 mins a day 5 times a week with a goal of 10-15lb weight loss in the next 3 months. Patient voiced their understanding and motivation to adhere to these recommendations.  Recommend continue cut back on alcohol.

## 2022-12-30 NOTE — Progress Notes (Signed)
BP 109/72   Pulse 69   Temp 98.3 F (36.8 C) (Oral)   Ht 6' 1.5" (1.867 m)   Wt 253 lb (114.8 kg)   SpO2 98%   BMI 32.92 kg/m    Subjective:    Patient ID: [name], male    DOB: 02/26/1971, 52 y.o.   MRN: [id number]  HPI: [name] is a 52 y.o. male  Chief Complaint  Patient presents with   Diabetes   Hyperlipidemia   Hypertension   Depression   Per reason for visit note patient is here today to discuss assisted living: [facility].  He reports his brothers have not talked to him about this, but he knew something was going on.  States he is okay with going to assisted living.  Currently he does have aide come in daily to assist with any needs, she helps with dressing him and shaving.  Is using pill packs at home, this assists with needs.  Call his friend Leonette Most for FL2 pick-up [phone].  DIABETES Continues on Metformin XR 1000 MG BID with last A1c 6.1% in January.  Has lost 16 pounds since last visit, has been eating healthier + has cut back on alcohol use.  Currently he reports having cut back on beer intake -- about once to twice a week will have one beer.  History of B12 deficiency, recent levels improved with supplement.    Hypoglycemic episodes:no Polydipsia/polyuria: no Visual disturbance: no Chest pain: no Paresthesias: no Glucose Monitoring: yes             Accucheck frequency: not checking             Fasting glucose: none              Post prandial:             Evening:             Before meals: Taking Insulin?: no             Long acting insulin:             Short acting insulin: Blood Pressure Monitoring: daily Retinal Examination: Not Up To Date -- last in September 2022 at [facility] Foot Exam: Up to Date Pneumovax: Up to Date Influenza: up to date Aspirin: yes   HYPERTENSION / HYPERLIPIDEMIA Continues on Amlodipine 10 MG, ASA, Plavix, Carvedilol 6.25 MG BID, Lisinopril 5 MG QDAY, and Lipitor 80 MG daily.  Saw cardiology last  11/06/21.  History of CVA, continues on Aricept for memory changes.  Has underlying chronic communicating hydrocephalus, neurology last seen 06/20/2020.  He denies any further incontinence issues of urine or stool.  Has underlying issues with mobility due to past CVA. Satisfied with current treatment? yes Duration of hypertension: chronic BP monitoring frequency: rarely BP range: on average <130/80 BP medication side effects: no Duration of hyperlipidemia: chronic Cholesterol medication side effects: no Cholesterol supplements: none Medication compliance: good compliance Aspirin: yes Recent stressors: no Recurrent headaches: no Visual changes: no Palpitations: no Dyspnea: no Chest pain: no Lower extremity edema: no Dizzy/lightheaded: no  The ASCVD Risk score (Arnett DK, et al., 2019) failed to calculate for the following reasons:   The patient has a prior MI or stroke diagnosis   DEPRESSION Continues on Celexa 10 MG daily. Mood status: stable Satisfied with current treatment?: yes Symptom severity: moderate  Duration of current treatment : chronic Side effects: no Medication compliance: good compliance Psychotherapy/counseling: in the  past Depressed mood:none Anxious mood: no Anhedonia: no Significant weight loss or gain: no Insomnia: none Fatigue: no Feelings of worthlessness or guilt: no Impaired concentration/indecisiveness: no Suicidal ideations: no Hopelessness: no Crying spells: no    12/30/2022    2:15 PM 07/12/2022    5:13 PM 09/10/2021    2:51 PM 07/10/2021    9:14 AM 04/24/2021   11:05 AM  Depression screen PHQ 2/9  Decreased Interest 0 1 0 0 0  Down, Depressed, Hopeless 0 0 0 0 0  PHQ - 2 Score 0 1 0 0 0  Altered sleeping 0 1 0  0  Tired, decreased energy 0 1 0  0  Change in appetite 0 0 0  0  Feeling bad or failure about yourself  0 0 0  0  Trouble concentrating 0 0 0  1  Moving slowly or fidgety/restless 0 0 0  0  Suicidal thoughts 0 0 0  0  PHQ-9  Score 0 3 0  1  Difficult doing work/chores Not difficult at all Not difficult at all Not difficult at all  Not difficult at all       12/30/2022    2:16 PM 07/12/2022    5:13 PM 04/24/2021   11:07 AM 10/01/2019   12:11 PM  GAD 7 : Generalized Anxiety Score  Nervous, Anxious, on Edge 0 0 0 0  Control/stop worrying 0 0 0 0  Worry too much - different things 0 0 0 0  Trouble relaxing 0 0 0 0  Restless 0 0 0 0  Easily annoyed or irritable 0 0 0 0  Afraid - awful might happen 0 0 0 0  Total GAD 7 Score 0 0 0 0  Anxiety Difficulty Not difficult at all Not difficult at all Not difficult at all    Relevant past medical, surgical, family and social history reviewed and updated as indicated. Interim medical history since our last visit reviewed. Allergies and medications reviewed and updated.  Review of Systems  Constitutional:  Negative for activity change, diaphoresis, fatigue and fever.  Respiratory:  Negative for cough, chest tightness, shortness of breath and wheezing.   Cardiovascular:  Positive for leg swelling. Negative for chest pain and palpitations.  Gastrointestinal: Negative.   Endocrine: Negative for polydipsia, polyphagia and polyuria.  Neurological:  Negative for dizziness, syncope, weakness, light-headedness, numbness and headaches.  Psychiatric/Behavioral: Negative.      Per HPI unless specifically indicated above     Objective:    BP 109/72   Pulse 69   Temp 98.3 F (36.8 C) (Oral)   Ht 6' 1.5" (1.867 m)   Wt 253 lb (114.8 kg)   SpO2 98%   BMI 32.92 kg/m   Wt Readings from Last 3 Encounters:  12/30/22 253 lb (114.8 kg)  07/12/22 269 lb (122 kg)  04/10/22 294 lb (133.4 kg)    Physical Exam Vitals and nursing note reviewed.  Constitutional:      General: He is awake. He is not in acute distress.    Appearance: He is well-developed. He is obese. He is not ill-appearing.  HENT:     Head: Normocephalic and atraumatic.     Right Ear: Hearing normal. No  drainage.     Left Ear: Hearing normal. No drainage.  Eyes:     General: Lids are normal.        Right eye: No discharge.        Left eye: No discharge.  Conjunctiva/sclera: Conjunctivae normal.     Pupils: Pupils are equal, round, and reactive to light.  Neck:     Thyroid: No thyromegaly.     Vascular: No carotid bruit.  Cardiovascular:     Rate and Rhythm: Normal rate and regular rhythm.     Heart sounds: Normal heart sounds, S1 normal and S2 normal. No murmur heard.    No gallop.  Pulmonary:     Effort: Pulmonary effort is normal. No accessory muscle usage or respiratory distress.     Breath sounds: Normal breath sounds.  Abdominal:     General: Bowel sounds are normal.     Palpations: Abdomen is soft.  Musculoskeletal:        General: Normal range of motion.     Cervical back: Normal range of motion and neck supple.     Right lower leg: Edema (trace) present.     Left lower leg: Edema (trace) present.     Comments: Utilizing manual W/C today.  Skin:    General: Skin is warm and dry.     Capillary Refill: Capillary refill takes less than 2 seconds.  Neurological:     Mental Status: He is alert and oriented to person, place, and time.  Psychiatric:        Attention and Perception: Attention normal.        Mood and Affect: Mood normal.        Speech: Speech normal.        Behavior: Behavior normal. Behavior is cooperative.    Diabetic Foot Exam - Simple   Simple Foot Form Visual Inspection See comments: Yes Sensation Testing Intact to touch and monofilament testing bilaterally: Yes Pulse Check Posterior Tibialis and Dorsalis pulse intact bilaterally: Yes Comments Xerosis to both feet    Results for orders placed or performed in visit on 07/12/22  Bayer DCA Hb A1c Waived  Result Value Ref Range   HB A1C (BAYER DCA - WAIVED) 6.1 (H) 4.8 - 5.6 %  Comprehensive metabolic panel  Result Value Ref Range   Glucose 81 70 - 99 mg/dL   BUN 15 6 - 24 mg/dL    Creatinine, Ser 6.57 0.76 - 1.27 mg/dL   eGFR 78 >84 ON/GEX/5.28   BUN/Creatinine Ratio 13 9 - 20   Sodium 144 134 - 144 mmol/L   Potassium 4.1 3.5 - 5.2 mmol/L   Chloride 104 96 - 106 mmol/L   CO2 19 (L) 20 - 29 mmol/L   Calcium 9.5 8.7 - 10.2 mg/dL   Total Protein 7.0 6.0 - 8.5 g/dL   Albumin 4.4 4.1 - 5.1 g/dL   Globulin, Total 2.6 1.5 - 4.5 g/dL   Albumin/Globulin Ratio 1.7 1.2 - 2.2   Bilirubin Total 0.8 0.0 - 1.2 mg/dL   Alkaline Phosphatase 78 44 - 121 IU/L   AST 10 0 - 40 IU/L   ALT 11 0 - 44 IU/L  Lipid Panel w/o Chol/HDL Ratio  Result Value Ref Range   Cholesterol, Total 105 100 - 199 mg/dL   Triglycerides 61 0 - 149 mg/dL   HDL 49 >41 mg/dL   VLDL Cholesterol Cal 14 5 - 40 mg/dL   LDL Chol Calc (NIH) 42 0 - 99 mg/dL  CBC with Differential/Platelet  Result Value Ref Range   WBC 4.1 3.4 - 10.8 x10E3/uL   RBC 4.40 4.14 - 5.80 x10E6/uL   Hemoglobin 13.3 13.0 - 17.7 g/dL   Hematocrit 32.4 40.1 - 51.0 %   MCV  88 79 - 97 fL   MCH 30.2 26.6 - 33.0 pg   MCHC 34.4 31.5 - 35.7 g/dL   RDW 86.5 78.4 - 69.6 %   Platelets 265 150 - 450 x10E3/uL   Neutrophils 46 Not Estab. %   Lymphs 35 Not Estab. %   Monocytes 9 Not Estab. %   Eos 9 Not Estab. %   Basos 1 Not Estab. %   Neutrophils Absolute 1.9 1.4 - 7.0 x10E3/uL   Lymphocytes Absolute 1.4 0.7 - 3.1 x10E3/uL   Monocytes Absolute 0.4 0.1 - 0.9 x10E3/uL   EOS (ABSOLUTE) 0.4 0.0 - 0.4 x10E3/uL   Basophils Absolute 0.0 0.0 - 0.2 x10E3/uL   Immature Granulocytes 0 Not Estab. %   Immature Grans (Abs) 0.0 0.0 - 0.1 x10E3/uL  TSH  Result Value Ref Range   TSH 0.613 0.450 - 4.500 uIU/mL      Assessment & Plan:   Problem List Items Addressed This Visit       Cardiovascular and Mediastinum   Cardiomyopathy (HCC)    Stable, followed by cardiology, continue this collaboration.  Recommend they schedule follow-up, his caregiver (brother) has been out due to stroke and been unable to take him recently.      Hypertension  associated with diabetes (HCC)    Chronic, stable.  BP at goal in office today for stroke prevention.  Continue current medication regimen and adjust as needed.  Recommend checking BP daily at home and documenting for provider. He has a BP cuff available at his home. LABS: CMP and urine ALB.  Refills up to date.  Return in 3 months for f/u.      Relevant Orders   Comprehensive metabolic panel   HgB A1c     Endocrine   Hyperlipidemia associated with type 2 diabetes mellitus (HCC)    Chronic, ongoing.  Continue current medication regimen and adjust as needed.  Lipid panel today.  Refills up to date. Return in 3 months.      Relevant Orders   Comprehensive metabolic panel   Lipid Panel w/o Chol/HDL Ratio   HgB A1c   Type 2 diabetes mellitus with diabetic polyneuropathy (HCC)    Refer to diabetes with proteinuria plan of care.      Relevant Orders   HgB A1c   Urine Microalbumin w/creat. ratio   Type 2 diabetes mellitus with proteinuria (HCC) - Primary    Chronic, ongoing.  A1c 6.1% last visit, trend down -- recheck today.  Urine ALB sample obtained.  Continue Metformin XR 1000 MG BID, although may need to adjust dependent on incontinence issues.  Due to history of hypoglycemia, which he has had in past, will avoid restart of Glipizide at this time and if elevation consider SGLT or GLP1 add on -- would be cautious with SGLT2 due to urinary incontinence at baseline. Continue Gabapentin for discomfort. Recommend he focus on diet, as still tends to snack a lot at home, and avoid alcohol use. Continue checking BS daily at home.  Return in 3 months. - Statin and ACE on board - Vaccinations up to date - Recommend he schedule eye exam.  Foot exam up to date.      Relevant Orders   HgB A1c   Urine Microalbumin w/creat. ratio     Nervous and Auditory   Chronic communicating hydrocephalus (HCC)    Ongoing, continue collaboration with neurology as needed and current memory medication as ordered  by them in past.  Recommend they schedule  follow-up with neurology, his caregiver (brother) has been out due to stroke and been unable to take him recently.        Other   History of stroke    Continue collaboration with neurology.  Discussed goals with patient LDL <70, A1c <6.5%, and BP <130/80.        Obesity    BMI 32.92 with T2DM, HTN.  Has lost 16 pounds with diet changes and alcohol reduction, praised for this.  Recommended eating smaller high protein, low fat meals more frequently and exercising 30 mins a day 5 times a week with a goal of 10-15lb weight loss in the next 3 months. Patient voiced their understanding and motivation to adhere to these recommendations.  Recommend continue cut back on alcohol.      Poor mobility    Chronic, ongoing due to past CVA.  Continue use of walker and W/C as needed.  Will complete FL2 form for assisted living, which may be beneficial for patient.      Urge incontinence of urine    Followed by urology, continue this collaboration and medications as prescribed by them.  Recent note reviewed.  He reports improved symptoms.  Monitor closely.  PSA on labs today.      Relevant Orders   PSA   Other Visit Diagnoses     Benign prostatic hyperplasia without lower urinary tract symptoms       PSA on labs today.   Relevant Orders   PSA        Follow up plan: Return in about 3 months (around 04/01/2023) for T2DM, HTN/HLD, DEPRESSION.

## 2022-12-30 NOTE — Assessment & Plan Note (Signed)
Refer to diabetes with proteinuria plan of care. 

## 2022-12-30 NOTE — Assessment & Plan Note (Signed)
Chronic, ongoing.  Continue current medication regimen and adjust as needed.  Lipid panel today.  Refills up to date. Return in 3 months. 

## 2022-12-31 ENCOUNTER — Telehealth: Payer: Self-pay

## 2022-12-31 LAB — COMPREHENSIVE METABOLIC PANEL
ALT: 10 IU/L (ref 0–44)
AST: 10 IU/L (ref 0–40)
Albumin: 4.2 g/dL (ref 4.1–5.1)
Alkaline Phosphatase: 62 IU/L (ref 44–121)
BUN/Creatinine Ratio: 16 (ref 9–20)
BUN: 16 mg/dL (ref 6–24)
Bilirubin Total: 0.5 mg/dL (ref 0.0–1.2)
CO2: 23 mmol/L (ref 20–29)
Calcium: 9.2 mg/dL (ref 8.7–10.2)
Chloride: 101 mmol/L (ref 96–106)
Creatinine, Ser: 1.01 mg/dL (ref 0.76–1.27)
Globulin, Total: 2.3 g/dL (ref 1.5–4.5)
Glucose: 151 mg/dL — ABNORMAL HIGH (ref 70–99)
Potassium: 4 mmol/L (ref 3.5–5.2)
Sodium: 141 mmol/L (ref 134–144)
Total Protein: 6.5 g/dL (ref 6.0–8.5)
eGFR: 91 mL/min/{1.73_m2} (ref >59)

## 2022-12-31 LAB — HEMOGLOBIN A1C
Est. average glucose Bld gHb Est-mCnc: 111 mg/dL
Hgb A1c MFr Bld: 5.5 % (ref 4.8–5.6)

## 2022-12-31 LAB — LIPID PANEL W/O CHOL/HDL RATIO
Cholesterol, Total: 107 mg/dL (ref 100–199)
HDL: 52 mg/dL (ref >39)
LDL Chol Calc (NIH): 41 mg/dL (ref 0–99)
Triglycerides: 62 mg/dL (ref 0–149)
VLDL Cholesterol Cal: 14 mg/dL (ref 5–40)

## 2022-12-31 LAB — PSA: Prostate Specific Ag, Serum: 0.8 ng/mL (ref 0.0–4.0)

## 2022-12-31 NOTE — Telephone Encounter (Signed)
LVM for [name] to pick up FL2

## 2022-12-31 NOTE — Telephone Encounter (Signed)
-----   Message from [name], NP sent at 12/30/2022  2:27 PM EDT ----- Will need FL2 form filled out and I will sign:)  Then we need to call "Call his friend Leonette Most for FL2 pick-up [phone]." I put this in note too so we do not lose it:)

## 2022-12-31 NOTE — Progress Notes (Signed)
Contacted via MyChart -- however please call as he does not consistently check MyChart:    Good afternoon [name], your labs have returned and no medication changes are needed.  Continue all current medications at current doses as levels on labs look great, including A1c (our diabetes test) which is 5.5%.  Great job!!  Any questions? Keep being stellar!!  Thank you for allowing me to participate in your care.  I appreciate you. Kindest regards, Reeve Mallo

## 2023-01-07 ENCOUNTER — Other Ambulatory Visit: Payer: Self-pay | Admitting: Nurse Practitioner

## 2023-01-07 DIAGNOSIS — E1129 Type 2 diabetes mellitus with other diabetic kidney complication: Secondary | ICD-10-CM

## 2023-01-07 LAB — MICROALBUMIN, URINE WAIVED
Creatinine, Urine Waived: 200 mg/dL (ref 10–300)
Microalb, Ur Waived: 150 mg/L — ABNORMAL HIGH (ref 0–19)

## 2023-01-21 ENCOUNTER — Ambulatory Visit: Payer: Self-pay

## 2023-01-21 NOTE — Telephone Encounter (Signed)
Message from [place] B sent at 01/21/2023  9:20 AM EDT  Summary: swelling in ankles   Pt called in has swelling in ankles        Called mobile and VM full. Called [phone] and not a working number. Called [phone] and was able to LM to call back.

## 2023-01-21 NOTE — Telephone Encounter (Addendum)
Message from Turkey B sent at 01/21/2023  9:20 AM EDT  Summary: swelling in ankles   Pt called in has swelling in ankles         Reason for Disposition . Third attempt to contact caller AND no contact made. Phone number verified.  Protocols used: No Contact or Duplicate Contact Call-A-AH

## 2023-01-21 NOTE — Telephone Encounter (Signed)
Message from Turkey B sent at 01/21/2023  9:20 AM EDT  Summary: swelling in ankles   Pt called in has swelling in ankles        Called cell number and VM full.

## 2023-03-13 ENCOUNTER — Telehealth: Payer: Self-pay | Admitting: Nurse Practitioner

## 2023-03-13 NOTE — Telephone Encounter (Signed)
Called and gave verbal orders per [name].  

## 2023-03-13 NOTE — Telephone Encounter (Signed)
Home Health Verbal Orders - Caller/Agency: Vilinda Flake  Callback Number: [phone] Requesting OT/PT/Skilled Nursing/Social Work/Speech Therapy: Speech therapy  Frequency:  1w4 Then every other week until the end of the cert (for the last 4 weeks) Memory issues and fluency disorder (stutter)

## 2023-04-01 ENCOUNTER — Ambulatory Visit: Payer: Medicare Other | Admitting: Nurse Practitioner

## 2023-04-01 DIAGNOSIS — Z8673 Personal history of transient ischemic attack (TIA), and cerebral infarction without residual deficits: Secondary | ICD-10-CM

## 2023-04-01 DIAGNOSIS — E538 Deficiency of other specified B group vitamins: Secondary | ICD-10-CM

## 2023-04-01 DIAGNOSIS — E1159 Type 2 diabetes mellitus with other circulatory complications: Secondary | ICD-10-CM

## 2023-04-01 DIAGNOSIS — E1129 Type 2 diabetes mellitus with other diabetic kidney complication: Secondary | ICD-10-CM

## 2023-04-01 DIAGNOSIS — Z23 Encounter for immunization: Secondary | ICD-10-CM

## 2023-04-01 DIAGNOSIS — I429 Cardiomyopathy, unspecified: Secondary | ICD-10-CM

## 2023-04-01 DIAGNOSIS — N3941 Urge incontinence: Secondary | ICD-10-CM

## 2023-04-01 DIAGNOSIS — E1142 Type 2 diabetes mellitus with diabetic polyneuropathy: Secondary | ICD-10-CM

## 2023-04-01 DIAGNOSIS — G910 Communicating hydrocephalus: Secondary | ICD-10-CM

## 2023-04-01 DIAGNOSIS — E1169 Type 2 diabetes mellitus with other specified complication: Secondary | ICD-10-CM

## 2023-04-18 ENCOUNTER — Other Ambulatory Visit: Payer: Self-pay | Admitting: Nurse Practitioner

## 2023-04-18 NOTE — Telephone Encounter (Signed)
Requested Prescriptions  Pending Prescriptions Disp Refills   metFORMIN (GLUCOPHAGE-XR) 500 MG 24 hr tablet [Pharmacy Med Name: METFORMIN HCL ER 500 MG TAB] 360 tablet 4    Sig: TAKE 2 TABLETS BY MOUTH TWICE DAILY WITHA MEAL     Endocrinology:  Diabetes - Biguanides Passed - 04/18/2023 11:45 AM      Passed - Cr in normal range and within 360 days    Creatinine  Date Value Ref Range Status  07/07/2014 1.19 0.60 - 1.30 mg/dL Final   Creatinine, Ser  Date Value Ref Range Status  12/30/2022 1.01 0.76 - 1.27 mg/dL Final         Passed - HBA1C is between 0 and 7.9 and within 180 days    Hemoglobin A1C  Date Value Ref Range Status  09/27/2015 6.5  Final   HB A1C (BAYER DCA - WAIVED)  Date Value Ref Range Status  07/12/2022 6.1 (H) 4.8 - 5.6 % Final    Comment:             Prediabetes: 5.7 - 6.4          Diabetes: >6.4          Glycemic control for adults with diabetes: <7.0    Hgb A1c MFr Bld  Date Value Ref Range Status  12/30/2022 5.5 4.8 - 5.6 % Final    Comment:             Prediabetes: 5.7 - 6.4          Diabetes: >6.4          Glycemic control for adults with diabetes: <7.0          Passed - eGFR in normal range and within 360 days    EGFR (African American)  Date Value Ref Range Status  07/07/2014 >60 >61mL/min Final  12/15/2013 >60  Final   GFR calc Af Amer  Date Value Ref Range Status  07/18/2020 107 >59 mL/min/1.73 Final    Comment:    **In accordance with recommendations from the NKF-ASN Task force,**   [facility] is in the process of updating its eGFR calculation to the   2021 CKD-EPI creatinine equation that estimates kidney function   without a race variable.    EGFR (Non-African Amer.)  Date Value Ref Range Status  07/07/2014 >60 >39mL/min Final    Comment:    eGFR values <51mL/min/1.73 m2 may be an indication of chronic kidney disease (CKD). Calculated eGFR, using the MRDR Study equation, is useful in  patients with stable renal function. The  eGFR calculation will not be reliable in acutely ill patients when serum creatinine is changing rapidly. It is not useful in patients on dialysis. The eGFR calculation may not be applicable to patients at the low and high extremes of body sizes, pregnant women, and vegetarians.   12/15/2013 >60  Final    Comment:    eGFR values <32mL/min/1.73 m2 may be an indication of chronic kidney disease (CKD). Calculated eGFR is useful in patients with stable renal function. The eGFR calculation will not be reliable in acutely ill patients when serum creatinine is changing rapidly. It is not useful in  patients on dialysis. The eGFR calculation may not be applicable to patients at the low and high extremes of body sizes, pregnant women, and vegetarians.    GFR, Estimated  Date Value Ref Range Status  01/24/2022 >60 >60 mL/min Final    Comment:    (NOTE) Calculated using the CKD-EPI Creatinine  Equation (2021)    eGFR  Date Value Ref Range Status  12/30/2022 91 >59 mL/min/1.73 Final         Passed - B12 Level in normal range and within 720 days    Vitamin B-12  Date Value Ref Range Status  04/10/2022 1,132 232 - 1,245 pg/mL Final         Passed - Valid encounter within last 6 months    Recent Outpatient Visits           3 months ago Type 2 diabetes mellitus with proteinuria (HCC)   [facility], [name] T, NP   9 months ago Medicare annual wellness visit, subsequent   [facility] [place], [place] T, NP   1 year ago Type 2 diabetes mellitus with proteinuria (HCC)   [facility] [place], [name] T, NP   1 year ago Type 2 diabetes mellitus with proteinuria (HCC)   [facility] [place], [name] T, NP   1 year ago Great toe pain, left   [facility], Lauren A, NP              Passed - CBC within normal limits and completed in the last 12 months     WBC  Date Value Ref Range Status  07/12/2022 4.1 3.4 - 10.8 x10E3/uL Final  01/24/2022 4.0 4.0 - 10.5 K/uL Final   RBC  Date Value Ref Range Status  07/12/2022 4.40 4.14 - 5.80 x10E6/uL Final  01/24/2022 4.44 4.22 - 5.81 MIL/uL Final   Hemoglobin  Date Value Ref Range Status  07/12/2022 13.3 13.0 - 17.7 g/dL Final   Hematocrit  Date Value Ref Range Status  07/12/2022 38.7 37.5 - 51.0 % Final   MCHC  Date Value Ref Range Status  07/12/2022 34.4 31.5 - 35.7 g/dL Final  16/04/9603 54.0 30.0 - 36.0 g/dL Final   [facility]  Date Value Ref Range Status  07/12/2022 30.2 26.6 - 33.0 pg Final  01/24/2022 29.5 26.0 - 34.0 pg Final   MCV  Date Value Ref Range Status  07/12/2022 88 79 - 97 fL Final  07/07/2014 90 80 - 100 fL Final   No results found for: "PLTCOUNTKUC", "LABPLAT", "POCPLA" RDW  Date Value Ref Range Status  07/12/2022 13.1 11.6 - 15.4 % Final  07/07/2014 13.5 11.5 - 14.5 % Final

## 2023-04-25 ENCOUNTER — Telehealth: Payer: Self-pay | Admitting: Nurse Practitioner

## 2023-04-25 NOTE — Telephone Encounter (Signed)
Left message for Herby Abraham to verbal OK orders for the patient to change per Aura Dials, NP. Advised to give our office a call back if she has any questions or concerns regarding voice message.

## 2023-04-25 NOTE — Telephone Encounter (Signed)
Herby Abraham speech therapist from [facility], called requesting to have the visits for this week moved to next week  Best contact: [phone]

## 2023-05-12 ENCOUNTER — Ambulatory Visit: Payer: Self-pay

## 2023-05-12 NOTE — Telephone Encounter (Signed)
Chief Complaint: Diarrhea  Symptoms: Mild to Moderate loose stools  Frequency: Comes and goes  Pertinent Negatives: Patient denies nausea, vomiting, abdominal pain  Disposition: [] ED /[] Urgent Care (no appt availability in office) / [x] Appointment(In office/virtual)/ []  [facility]/ [] Home Care/ [] Refused Recommended Disposition /[] [place] [facility]/ []  Follow-up with PCP Additional Notes: Patient reports having diarrhea for about 1 month now. Patient reports 2 loose stools in the past 24 hours. Patient denies signs of dehydration. Patient has not tried any over the counter treatment. Care advice was given and  patient has been scheduled for first available appointment with PCP on 05/21/23. Advised patient to call back if symptoms get worse. Patient verbalized understanding.   Reason for Disposition  [1] Mild diarrhea (e.g., 1-3 or more stools than normal in past 24 hours) without known cause AND [2] present >  7 days  Answer Assessment - Initial Assessment Questions 1. DIARRHEA SEVERITY: "How bad is the diarrhea?" "How many more stools have you had in the past 24 hours than normal?"    - NO DIARRHEA (SCALE 0)   - MILD (SCALE 1-3): Few loose or mushy BMs; increase of 1-3 stools over normal daily number of stools; mild increase in ostomy output.   -  MODERATE (SCALE 4-7): Increase of 4-6 stools daily over normal; moderate increase in ostomy output.   -  SEVERE (SCALE 8-10; OR "WORST POSSIBLE"): Increase of 7 or more stools daily over normal; moderate increase in ostomy output; incontinence.     2  2. ONSET: "When did the diarrhea begin?"      About 1 month ago  3. BM CONSISTENCY: "How loose or watery is the diarrhea?"      Loose  4. VOMITING: "Are you also vomiting?" If Yes, ask: "How many times in the past 24 hours?"      No  5. ABDOMEN PAIN: "Are you having any abdomen pain?" If Yes, ask: "What does it feel like?" (e.g., crampy, dull, intermittent, constant)      No  6.  ABDOMEN PAIN SEVERITY: If present, ask: "How bad is the pain?"  (e.g., Scale 1-10; mild, moderate, or severe)   - MILD (1-3): doesn't interfere with normal activities, abdomen soft and not tender to touch    - MODERATE (4-7): interferes with normal activities or awakens from sleep, abdomen tender to touch    - SEVERE (8-10): excruciating pain, doubled over, unable to do any normal activities       N/A  7. ORAL INTAKE: If vomiting, "Have you been able to drink liquids?" "How much liquids have you had in the past 24 hours?"     N/A  8. HYDRATION: "Any signs of dehydration?" (e.g., dry mouth [not just dry lips], too weak to stand, dizziness, new weight loss) "When did you last urinate?"     No  9. EXPOSURE: "Have you traveled to a foreign country recently?" "Have you been exposed to anyone with diarrhea?" "Could you have eaten any food that was spoiled?"     No  10. ANTIBIOTIC USE: "Are you taking antibiotics now or have you taken antibiotics in the past 2 months?"       No I don't think so  11. OTHER SYMPTOMS: "Do you have any other symptoms?" (e.g., fever, blood in stool)       No  Protocols used: Diarrhea-A-AH

## 2023-05-21 ENCOUNTER — Ambulatory Visit: Payer: Medicare Other | Admitting: Nurse Practitioner

## 2023-05-21 DIAGNOSIS — E1159 Type 2 diabetes mellitus with other circulatory complications: Secondary | ICD-10-CM

## 2023-05-21 DIAGNOSIS — G910 Communicating hydrocephalus: Secondary | ICD-10-CM

## 2023-05-21 DIAGNOSIS — Z23 Encounter for immunization: Secondary | ICD-10-CM

## 2023-05-21 DIAGNOSIS — Z8673 Personal history of transient ischemic attack (TIA), and cerebral infarction without residual deficits: Secondary | ICD-10-CM

## 2023-05-21 DIAGNOSIS — R159 Full incontinence of feces: Secondary | ICD-10-CM

## 2023-05-21 DIAGNOSIS — E1142 Type 2 diabetes mellitus with diabetic polyneuropathy: Secondary | ICD-10-CM

## 2023-05-21 DIAGNOSIS — E1169 Type 2 diabetes mellitus with other specified complication: Secondary | ICD-10-CM

## 2023-05-21 DIAGNOSIS — E1129 Type 2 diabetes mellitus with other diabetic kidney complication: Secondary | ICD-10-CM

## 2023-05-21 DIAGNOSIS — E66812 Obesity, class 2: Secondary | ICD-10-CM

## 2023-05-21 DIAGNOSIS — I429 Cardiomyopathy, unspecified: Secondary | ICD-10-CM

## 2023-05-21 DIAGNOSIS — Z7409 Other reduced mobility: Secondary | ICD-10-CM

## 2023-06-02 ENCOUNTER — Ambulatory Visit: Payer: Medicare Other | Admitting: Nurse Practitioner

## 2023-06-02 DIAGNOSIS — I429 Cardiomyopathy, unspecified: Secondary | ICD-10-CM

## 2023-06-02 DIAGNOSIS — Z23 Encounter for immunization: Secondary | ICD-10-CM

## 2023-06-02 DIAGNOSIS — E1142 Type 2 diabetes mellitus with diabetic polyneuropathy: Secondary | ICD-10-CM

## 2023-06-02 DIAGNOSIS — E1159 Type 2 diabetes mellitus with other circulatory complications: Secondary | ICD-10-CM

## 2023-06-02 DIAGNOSIS — E1129 Type 2 diabetes mellitus with other diabetic kidney complication: Secondary | ICD-10-CM

## 2023-06-02 DIAGNOSIS — E1169 Type 2 diabetes mellitus with other specified complication: Secondary | ICD-10-CM

## 2023-06-02 DIAGNOSIS — G910 Communicating hydrocephalus: Secondary | ICD-10-CM

## 2023-06-02 DIAGNOSIS — R159 Full incontinence of feces: Secondary | ICD-10-CM

## 2023-06-13 ENCOUNTER — Telehealth: Payer: Self-pay | Admitting: Nurse Practitioner

## 2023-06-13 NOTE — Telephone Encounter (Signed)
Order written and placed in providers folder for signature.  

## 2023-06-13 NOTE — Telephone Encounter (Signed)
Pt's brother [name] is calling in because pt's hospital bed is broken and [facility] said if [name] will send in a prescription for a bed they can get him another one. [name] says pt is almost in the floor due to the bed being broken. [name] is requesting a call back when the bed is sent.

## 2023-06-16 NOTE — Telephone Encounter (Signed)
Order faxed to [employer] as requested.   Called and LVM notifying patient's brother that this was done for them.

## 2023-07-11 ENCOUNTER — Telehealth: Payer: Self-pay | Admitting: Nurse Practitioner

## 2023-07-11 NOTE — Telephone Encounter (Signed)
Called and LVM giving verbal orders per [name].  ?

## 2023-07-11 NOTE — Telephone Encounter (Signed)
 Copied from CRM [id number]. Topic: Quick Communication - Home Health Verbal Orders >> Jul 11, 2023  8:07 AM Everette C wrote: Caller/Agency:  Darice IVER Donn Mitch Number: [phone] Service Requested: Speech Therapy Frequency: 1 every other week 8  Service Requested: Physical Therapy Frequency: evaluation Any new concerns about the patient? Yes   Darice would like her contact number provided to the patient's bother if possible to share information regarding the roders

## 2023-07-22 ENCOUNTER — Telehealth: Payer: Self-pay | Admitting: Nurse Practitioner

## 2023-07-22 NOTE — Telephone Encounter (Signed)
 Lequita Halt with [facility] is the PT Therapist who is seeing the patient and he is requesting a Air traffic controller. He is just seeing him for an evaluation. Please assist patient further by sending a script in to any DME company.

## 2023-07-23 NOTE — Telephone Encounter (Signed)
 Is it ok to place an order via [employer]?

## 2023-07-24 DIAGNOSIS — I69311 Memory deficit following cerebral infarction: Secondary | ICD-10-CM | POA: Diagnosis not present

## 2023-07-24 DIAGNOSIS — I69398 Other sequelae of cerebral infarction: Secondary | ICD-10-CM | POA: Diagnosis not present

## 2023-07-24 DIAGNOSIS — E1169 Type 2 diabetes mellitus with other specified complication: Secondary | ICD-10-CM

## 2023-07-24 DIAGNOSIS — I69320 Aphasia following cerebral infarction: Secondary | ICD-10-CM | POA: Diagnosis not present

## 2023-07-24 DIAGNOSIS — E1142 Type 2 diabetes mellitus with diabetic polyneuropathy: Secondary | ICD-10-CM

## 2023-07-24 DIAGNOSIS — I152 Hypertension secondary to endocrine disorders: Secondary | ICD-10-CM

## 2023-07-24 DIAGNOSIS — I429 Cardiomyopathy, unspecified: Secondary | ICD-10-CM

## 2023-07-24 DIAGNOSIS — G910 Communicating hydrocephalus: Secondary | ICD-10-CM | POA: Diagnosis not present

## 2023-07-24 DIAGNOSIS — E1129 Type 2 diabetes mellitus with other diabetic kidney complication: Secondary | ICD-10-CM

## 2023-07-24 DIAGNOSIS — E1159 Type 2 diabetes mellitus with other circulatory complications: Secondary | ICD-10-CM

## 2023-07-24 DIAGNOSIS — I69392 Facial weakness following cerebral infarction: Secondary | ICD-10-CM

## 2023-07-24 NOTE — Telephone Encounter (Signed)
Tried placing order for a bariatric rollator walker via [employer], but patient does not qualify for the bariatic one due to his weight not being at least 300 pounds. Order submitted for regular rollator walker to [facility]. They will reach out to the patient.

## 2023-07-26 ENCOUNTER — Emergency Department
Admission: EM | Admit: 2023-07-26 | Discharge: 2023-07-26 | Disposition: A | Discharge status: Home / Self Care | Payer: Medicare Other | Attending: Emergency Medicine | Admitting: Emergency Medicine

## 2023-07-26 ENCOUNTER — Other Ambulatory Visit: Payer: Self-pay

## 2023-07-26 DIAGNOSIS — R531 Weakness: Secondary | ICD-10-CM | POA: Insufficient documentation

## 2023-07-26 DIAGNOSIS — R300 Dysuria: Secondary | ICD-10-CM | POA: Diagnosis present

## 2023-07-26 DIAGNOSIS — Z8673 Personal history of transient ischemic attack (TIA), and cerebral infarction without residual deficits: Secondary | ICD-10-CM | POA: Diagnosis not present

## 2023-07-26 DIAGNOSIS — N390 Urinary tract infection, site not specified: Secondary | ICD-10-CM | POA: Diagnosis not present

## 2023-07-26 LAB — URINALYSIS, ROUTINE W REFLEX MICROSCOPIC
Bilirubin Urine: NEGATIVE
Glucose, UA: NEGATIVE mg/dL
Ketones, ur: NEGATIVE mg/dL
Nitrite: NEGATIVE
Protein, ur: 100 mg/dL — AB
RBC / HPF: >50 RBC/hpf (ref 0–5)
Specific Gravity, Urine: 1.026 (ref 1.005–1.030)
WBC, UA: >50 WBC/hpf (ref 0–5)
pH: 5 (ref 5.0–8.0)

## 2023-07-26 LAB — CBC
HCT: 42.3 % (ref 39.0–52.0)
Hemoglobin: 13.7 g/dL (ref 13.0–17.0)
MCH: 30 pg (ref 26.0–34.0)
MCHC: 32.4 g/dL (ref 30.0–36.0)
MCV: 92.8 fL (ref 80.0–100.0)
Platelets: 216 10*3/uL (ref 150–400)
RBC: 4.56 MIL/uL (ref 4.22–5.81)
RDW: 14.6 % (ref 11.5–15.5)
WBC: 11.4 10*3/uL — ABNORMAL HIGH (ref 4.0–10.5)
nRBC: 0 % (ref 0.0–0.2)

## 2023-07-26 LAB — BASIC METABOLIC PANEL
Anion gap: 13 (ref 5–15)
BUN: 16 mg/dL (ref 6–20)
CO2: 26 mmol/L (ref 22–32)
Calcium: 8.9 mg/dL (ref 8.9–10.3)
Chloride: 102 mmol/L (ref 98–111)
Creatinine, Ser: 1.24 mg/dL (ref 0.61–1.24)
GFR, Estimated: >60 mL/min (ref >60)
Glucose, Bld: 129 mg/dL — ABNORMAL HIGH (ref 70–99)
Potassium: 3.8 mmol/L (ref 3.5–5.1)
Sodium: 141 mmol/L (ref 135–145)

## 2023-07-26 MED ORDER — CEFPODOXIME PROXETIL 200 MG PO TABS: 200.0000 mg | ORAL_TABLET | Freq: Two times a day (BID) | ORAL | 0 refills | Status: AC

## 2023-07-26 NOTE — ED Notes (Signed)
Bladder scan performed: "3mL." No distention noted. Lab called for blood draw.

## 2023-07-26 NOTE — ED Provider Notes (Signed)
[facility] Provider Note   Event Date/Time   First MD Initiated Contact with Patient 07/26/23 1617     (approximate) History  Urinary Retention and Weakness  HPI [name] is a 53 y.o. male with a past medical history of CVA with residual left-sided deficits who presents complaining of the feeling of urinary retention and dysuria.  Patient Nuys any abdominal pain but states that he is felt like he has been unable to pee over the past 24 hours.  Patient states that he was able to be today but has a sensation that he cannot fully empty his bladder ROS: Patient currently denies any vision changes, tinnitus, difficulty speaking, facial droop, sore throat, chest pain, shortness of breath, nausea/vomiting/diarrhea, or weakness/numbness/paresthesias in any extremity   Physical Exam  Triage Vital Signs: ED Triage Vitals  Encounter Vitals Group     BP 07/26/23 1209 112/78     Systolic BP Percentile --      Diastolic BP Percentile --      Pulse Rate 07/26/23 1209 (!) 107     Resp 07/26/23 1209 20     Temp 07/26/23 1209 98.7 F (37.1 C)     Temp Source 07/26/23 1209 Oral     SpO2 07/26/23 1209 98 %     Weight 07/26/23 1210 250 lb (113.4 kg)     Height 07/26/23 1210 6' (1.829 m)     Head Circumference --      Peak Flow --      Pain Score 07/26/23 1210 0     Pain Loc --      Pain Education --      Exclude from Growth Chart --    Most recent vital signs: Vitals:   07/26/23 1800 07/26/23 1830  BP: 124/88 127/89  Pulse:  76  Resp: 15 14  Temp:    SpO2:  98%   General: Awake, oriented x4. CV:  Good peripheral perfusion.  Resp:  Normal effort.  Abd:  No distention.  Other:  Middle-aged overweight African-American male resting comfortably in no acute distress.  OL amblyopia ED Results / Procedures / Treatments  Labs (all labs ordered are listed, but only abnormal results are displayed) Labs Reviewed  BASIC METABOLIC PANEL - Abnormal; Notable for the  following components:      Result Value   Glucose, Bld 129 (*)    All other components within normal limits  CBC - Abnormal; Notable for the following components:   WBC 11.4 (*)    All other components within normal limits  URINALYSIS, ROUTINE W REFLEX MICROSCOPIC - Abnormal; Notable for the following components:   Color, Urine AMBER (*)    APPearance HAZY (*)    Hgb urine dipstick LARGE (*)    Protein, ur 100 (*)    Leukocytes,Ua MODERATE (*)    Bacteria, UA FEW (*)    All other components within normal limits  CBG MONITORING, ED   EKG ED ECG REPORT I, Merwyn Katos, the attending physician, personally viewed and interpreted this ECG. Date: 07/26/2023 EKG Time: 1221 Rate: 86 Rhythm: normal sinus rhythm QRS Axis: normal Intervals: normal ST/T Wave abnormalities: normal Narrative Interpretation: no evidence of acute ischemia PROCEDURES: Critical Care performed: No Procedures MEDICATIONS ORDERED IN ED: Medications - No data to display IMPRESSION / MDM / ASSESSMENT AND PLAN / ED COURSE  I reviewed the triage vital signs and the nursing notes.  The patient is on the cardiac monitor to evaluate for evidence of arrhythmia and/or significant heart rate changes. Patient's presentation is most consistent with acute presentation with potential threat to life or bodily function. No e/o epididymo-orchitis on exam and low suspicion for rectal abscess, prostatitis, other GU deep space infection, gonorrhea/chlamydia. Unlikely Infected Urolithiasis, AAA, cholecystitis, pancreatitis, SBO, appendicitis, or other acute abdomen. Workup: UA: None Rx: Cefpodoxime 200 mg twice daily x5 days  Disposition: Discharge home. SRP discussed. Advise follow up with primary care provider within 24-72 hours.   FINAL CLINICAL IMPRESSION(S) / ED DIAGNOSES   Final diagnoses:  None   Rx / DC Orders   ED Discharge Orders     None      Note:  This document was prepared  using Dragon voice recognition software and may include unintentional dictation errors.   Merwyn Katos, MD 07/26/23 [phone]

## 2023-07-26 NOTE — ED Notes (Signed)
Pt cleaned of incontinence. Provided PO fluids

## 2023-07-26 NOTE — ED Triage Notes (Signed)
Arrived by EMS from home. C/o weakness and bladder incontinence X2 days. Family reports patient is at his baseline. Family poor historians   Stroke X2 years ago  EMS vitals: 133CBG 131/84 b/p 81HR 100% RA 98.3 oral

## 2023-07-26 NOTE — ED Notes (Signed)
Called [facility] spoke with Supervisor Ebony Cargo, she stated pt. is second on the list to be picked up.

## 2023-07-26 NOTE — ED Triage Notes (Signed)
Pt to ED from home for weakness and urinary retention since 2-3 days. Hx stroke, L sided weakness. Pt states has urinated today. Poor historian. Oriented to place self and situation, disoriented to time. Per first nurse note, pt at baseline.

## 2023-07-28 ENCOUNTER — Other Ambulatory Visit: Payer: Self-pay | Admitting: Nurse Practitioner

## 2023-07-29 ENCOUNTER — Telehealth: Payer: Self-pay

## 2023-07-29 NOTE — Telephone Encounter (Signed)
Requested medication (s) are due for refill today - yes  Requested medication (s) are on the active medication list -yes  Future visit scheduled -no  Last refill: 07/12/22 #90 3RF  Notes to clinic: Attempted to call patient to schedule appointment - no answer- message left to call office for appointment. Off protocol- provider review   Requested Prescriptions  Pending Prescriptions Disp Refills   GEMTESA 75 MG TABS [Pharmacy Med Name: GEMTESA 75 MG TAB] 90 tablet 3    Sig: TAKE 1 TABLET BY MOUTH ONCE DAILY     Off-Protocol Failed - 07/29/2023  9:19 AM      Failed - Medication not assigned to a protocol, review manually.      Passed - Valid encounter within last 12 months    Recent Outpatient Visits           7 months ago Type 2 diabetes mellitus with proteinuria (HCC)   [place] [facility] [place], [place] T, NP   1 year ago Medicare annual wellness visit, subsequent   [facility] [place], [place] T, NP   1 year ago Type 2 diabetes mellitus with proteinuria (HCC)   [place] [facility] [place], [name] T, NP   1 year ago Type 2 diabetes mellitus with proteinuria (HCC)   [place] [facility] [place], [name] T, NP   2 years ago Great toe pain, left   [place] [facility] [place], Jake Church, NP              Signed Prescriptions Disp Refills   metFORMIN (GLUCOPHAGE-XR) 500 MG 24 hr tablet 120 tablet 0    Sig: TAKE 2 TABLETS BY MOUTH TWICE DAILY WITHA MEAL     Endocrinology:  Diabetes - Biguanides Failed - 07/29/2023  9:19 AM      Failed - HBA1C is between 0 and 7.9 and within 180 days    Hemoglobin A1C  Date Value Ref Range Status  09/27/2015 6.5  Final   HB A1C (BAYER DCA - WAIVED)  Date Value Ref Range Status  07/12/2022 6.1 (H) 4.8 - 5.6 % Final    Comment:             Prediabetes: 5.7 - 6.4          Diabetes: >6.4          Glycemic control for adults with diabetes: <7.0     Hgb A1c MFr Bld  Date Value Ref Range Status  12/30/2022 5.5 4.8 - 5.6 % Final    Comment:             Prediabetes: 5.7 - 6.4          Diabetes: >6.4          Glycemic control for adults with diabetes: <7.0          Failed - Valid encounter within last 6 months    Recent Outpatient Visits           7 months ago Type 2 diabetes mellitus with proteinuria (HCC)   [place] [facility] [place], [name] T, NP   1 year ago Medicare annual wellness visit, subsequent   [place] [facility] [place], [name] T, NP   1 year ago Type 2 diabetes mellitus with proteinuria (HCC)   [facility] [place], [place] T, NP   1 year ago Type 2 diabetes mellitus with proteinuria (HCC)   [place] [facility] [place], [name] T, NP   2 years ago Great toe pain, left    [facility], [facility] A, NP              Failed - CBC within normal limits and completed in the last 12 months    WBC  Date Value Ref Range Status  07/26/2023 11.4 (H) 4.0 - 10.5 K/uL Final   RBC  Date Value Ref Range Status  07/26/2023 4.56 4.22 - 5.81 MIL/uL Final   Hemoglobin  Date Value Ref Range Status  07/26/2023 13.7 13.0 - 17.0 g/dL Final  30/16/0109 32.3 13.0 - 17.7 g/dL Final   HCT  Date Value Ref Range Status  07/26/2023 42.3 39.0 - 52.0 % Final   Hematocrit  Date Value Ref Range Status  07/12/2022 38.7 37.5 - 51.0 % Final   MCHC  Date Value Ref Range Status  07/26/2023 32.4 30.0 - 36.0 g/dL Final   [facility]  Date Value Ref Range Status  07/26/2023 30.0 26.0 - 34.0 pg Final   MCV  Date Value Ref Range Status  07/26/2023 92.8 80.0 - 100.0 fL Final  07/12/2022 88 79 - 97 fL Final  07/07/2014 90 80 - 100 fL Final   No results found for: "PLTCOUNTKUC", "LABPLAT", "POCPLA" RDW  Date Value Ref Range Status  07/26/2023 14.6 11.5 - 15.5 % Final  07/12/2022 13.1 11.6 - 15.4 % Final  07/07/2014 13.5 11.5 - 14.5  % Final         Passed - Cr in normal range and within 360 days    Creatinine  Date Value Ref Range Status  07/07/2014 1.19 0.60 - 1.30 mg/dL Final   Creatinine, Ser  Date Value Ref Range Status  07/26/2023 1.24 0.61 - 1.24 mg/dL Final         Passed - eGFR in normal range and within 360 days    EGFR (African American)  Date Value Ref Range Status  07/07/2014 >60 >17mL/min Final  12/15/2013 >60  Final   GFR calc Af Amer  Date Value Ref Range Status  07/18/2020 107 >59 mL/min/1.73 Final    Comment:    **In accordance with recommendations from the NKF-ASN Task force,**   [facility] is in the process of updating its eGFR calculation to the   2021 CKD-EPI creatinine equation that estimates kidney function   without a race variable.    EGFR (Non-African Amer.)  Date Value Ref Range Status  07/07/2014 >60 >60mL/min Final    Comment:    eGFR values <71mL/min/1.73 m2 may be an indication of chronic kidney disease (CKD). Calculated eGFR, using the MRDR Study equation, is useful in  patients with stable renal function. The eGFR calculation will not be reliable in acutely ill patients when serum creatinine is changing rapidly. It is not useful in patients on dialysis. The eGFR calculation may not be applicable to patients at the low and high extremes of body sizes, pregnant women, and vegetarians.   12/15/2013 >60  Final    Comment:    eGFR values <90mL/min/1.73 m2 may be an indication of chronic kidney disease (CKD). Calculated eGFR is useful in patients with stable renal function. The eGFR calculation will not be reliable in acutely ill patients when serum creatinine is changing rapidly. It is not useful in  patients on dialysis. The eGFR calculation may not be applicable to patients at the low and high extremes of body sizes, pregnant women, and vegetarians.  GFR, Estimated  Date Value Ref Range Status  07/26/2023 >60 >60 mL/min Final    Comment:     (NOTE) Calculated using the CKD-EPI Creatinine Equation (2021)    eGFR  Date Value Ref Range Status  12/30/2022 91 >59 mL/min/1.73 Final         Passed - B12 Level in normal range and within 720 days    Vitamin B-12  Date Value Ref Range Status  04/10/2022 1,132 232 - 1,245 pg/mL Final          lisinopril (ZESTRIL) 5 MG tablet 30 tablet 0    Sig: TAKE 1 TABLET BY MOUTH ONCE DAILY     Cardiovascular:  ACE Inhibitors Failed - 07/29/2023  9:19 AM      Failed - Last BP in normal range    BP Readings from Last 1 Encounters:  07/26/23 (!) 125/91         Failed - Valid encounter within last 6 months    Recent Outpatient Visits           7 months ago Type 2 diabetes mellitus with proteinuria (HCC)   [place] [facility] [place], [name] T, NP   1 year ago Medicare annual wellness visit, subsequent   [place] [facility] [place], [place] T, NP   1 year ago Type 2 diabetes mellitus with proteinuria (HCC)   [place] [facility] [place], [name] T, NP   1 year ago Type 2 diabetes mellitus with proteinuria (HCC)   [place] [facility] [place], [name] T, NP   2 years ago Great toe pain, left   [facility], Lauren A, NP              Passed - Cr in normal range and within 180 days    Creatinine  Date Value Ref Range Status  07/07/2014 1.19 0.60 - 1.30 mg/dL Final   Creatinine, Ser  Date Value Ref Range Status  07/26/2023 1.24 0.61 - 1.24 mg/dL Final         Passed - K in normal range and within 180 days    Potassium  Date Value Ref Range Status  07/26/2023 3.8 3.5 - 5.1 mmol/L Final  07/07/2014 3.7 3.5 - 5.1 mmol/L Final         Passed - Patient is not pregnant       carvedilol (COREG) 6.25 MG tablet 60 tablet 0    Sig: TAKE 1 TABLET BY MOUTH TWICE DAILY WITH A MEAL     Cardiovascular: Beta Blockers 3 Failed - 07/29/2023  9:19 AM      Failed - Last BP in normal range     BP Readings from Last 1 Encounters:  07/26/23 (!) 125/91         Failed - Valid encounter within last 6 months    Recent Outpatient Visits           7 months ago Type 2 diabetes mellitus with proteinuria (HCC)   [place] [facility] [place], [name] T, NP   1 year ago Medicare annual wellness visit, subsequent   [facility] [name] [place], [name] T, NP   1 year ago Type 2 diabetes mellitus with proteinuria (HCC)    [facility] [place], [name] T, NP   1 year ago Type 2 diabetes mellitus with proteinuria (HCC)    [facility] [place], [name] T, NP   2 years  ago Great toe pain, left   [place] [facility], Lauren A, NP              Passed - Cr in normal range and within 360 days    Creatinine  Date Value Ref Range Status  07/07/2014 1.19 0.60 - 1.30 mg/dL Final   Creatinine, Ser  Date Value Ref Range Status  07/26/2023 1.24 0.61 - 1.24 mg/dL Final         Passed - AST in normal range and within 360 days    AST  Date Value Ref Range Status  12/30/2022 10 0 - 40 IU/L Final   SGOT(AST)  Date Value Ref Range Status  07/07/2014 10 (L) 15 - 37 Unit/L Final         Passed - ALT in normal range and within 360 days    ALT  Date Value Ref Range Status  12/30/2022 10 0 - 44 IU/L Final   SGPT (ALT)  Date Value Ref Range Status  07/07/2014 15 U/L Final    Comment:    14-63 NOTE: New Reference Range 01/25/14          Passed - Last Heart Rate in normal range    Pulse Readings from Last 1 Encounters:  07/26/23 78          amLODipine (NORVASC) 10 MG tablet 30 tablet 0    Sig: TAKE 1 TABLET BY MOUTH ONCE DAILY     Cardiovascular: Calcium Channel Blockers 2 Failed - 07/29/2023  9:19 AM      Failed - Last BP in normal range    BP Readings from Last 1 Encounters:  07/26/23 (!) 125/91         Failed - Valid encounter within last 6 months    Recent Outpatient Visits            7 months ago Type 2 diabetes mellitus with proteinuria (HCC)   [place] [facility] [place], [name] T, NP   1 year ago Medicare annual wellness visit, subsequent   [place] [facility] [place], [name] T, NP   1 year ago Type 2 diabetes mellitus with proteinuria (HCC)   [place] [facility] [place], [name] T, NP   1 year ago Type 2 diabetes mellitus with proteinuria (HCC)   [facility] [place], [place] T, NP   2 years ago Great toe pain, left   [place] [facility] [place], Lauren A, NP              Passed - Last Heart Rate in normal range    Pulse Readings from Last 1 Encounters:  07/26/23 78          Accu-Chek Softclix Lancets lancets 100 each 12    Sig: USE 2-3 TIMES A DAY AS DIRECTED     Endocrinology: Diabetes - Testing Supplies Passed - 07/29/2023  9:19 AM      Passed - Valid encounter within last 12 months    Recent Outpatient Visits           7 months ago Type 2 diabetes mellitus with proteinuria (HCC)   [facility] [place], [name] T, NP   1 year ago Medicare annual wellness visit, subsequent   [place] [facility] [place], [place] T, NP   1 year ago Type 2 diabetes mellitus with proteinuria [facility])   [place] [facility] [place], Dorie Rank, NP  1 year ago Type 2 diabetes mellitus with proteinuria (HCC)   [place] [facility] [place], [name] T, NP   2 years ago Great toe pain, left   [place] [facility] [place], Lauren A, NP               ACCU-CHEK GUIDE TEST test strip 100 each 12    Sig: USE TO CHECK BLOOD SUGAR 3 TIMES DAILY     There is no refill protocol information for this order       Requested Prescriptions  Pending Prescriptions Disp Refills   GEMTESA 75 MG TABS [Pharmacy Med Name: GEMTESA 75 MG TAB] 90 tablet 3    Sig: TAKE 1 TABLET BY MOUTH ONCE DAILY      Off-Protocol Failed - 07/29/2023  9:19 AM      Failed - Medication not assigned to a protocol, review manually.      Passed - Valid encounter within last 12 months    Recent Outpatient Visits           7 months ago Type 2 diabetes mellitus with proteinuria (HCC)   [place] [facility] [place], [place] T, NP   1 year ago Medicare annual wellness visit, subsequent   [facility] [place], [place] T, NP   1 year ago Type 2 diabetes mellitus with proteinuria (HCC)   [place] [place], [name] T, NP   1 year ago Type 2 diabetes mellitus with proteinuria (HCC)   [place] [facility] [place], [name] T, NP   2 years ago Great toe pain, left   [place], Jake Church, NP              Signed Prescriptions Disp Refills   metFORMIN (GLUCOPHAGE-XR) 500 MG 24 hr tablet 120 tablet 0    Sig: TAKE 2 TABLETS BY MOUTH TWICE DAILY WITHA MEAL     Endocrinology:  Diabetes - Biguanides Failed - 07/29/2023  9:19 AM      Failed - HBA1C is between 0 and 7.9 and within 180 days    Hemoglobin A1C  Date Value Ref Range Status  09/27/2015 6.5  Final   HB A1C (BAYER DCA - WAIVED)  Date Value Ref Range Status  07/12/2022 6.1 (H) 4.8 - 5.6 % Final    Comment:             Prediabetes: 5.7 - 6.4          Diabetes: >6.4          Glycemic control for adults with diabetes: <7.0    Hgb A1c MFr Bld  Date Value Ref Range Status  12/30/2022 5.5 4.8 - 5.6 % Final    Comment:             Prediabetes: 5.7 - 6.4          Diabetes: >6.4          Glycemic control for adults with diabetes: <7.0          Failed - Valid encounter within last 6 months    Recent Outpatient Visits           7 months ago Type 2 diabetes mellitus with proteinuria (HCC)   [place] [facility] [place], [name] T, NP   1 year ago Medicare annual wellness visit, subsequent   [place] [facility] [place], [name] T, NP   1 year  ago Type 2 diabetes mellitus with proteinuria (HCC)   [facility] [place], [name] T, NP   1 year ago Type 2 diabetes mellitus with proteinuria (HCC)   [place] [facility] [place], [name] T, NP   2 years ago Great toe pain, left   [place] [facility], Lauren A, NP              Failed - CBC within normal limits and completed in the last 12 months    WBC  Date Value Ref Range Status  07/26/2023 11.4 (H) 4.0 - 10.5 K/uL Final   RBC  Date Value Ref Range Status  07/26/2023 4.56 4.22 - 5.81 MIL/uL Final   Hemoglobin  Date Value Ref Range Status  07/26/2023 13.7 13.0 - 17.0 g/dL Final  16/04/9603 54.0 13.0 - 17.7 g/dL Final   HCT  Date Value Ref Range Status  07/26/2023 42.3 39.0 - 52.0 % Final   Hematocrit  Date Value Ref Range Status  07/12/2022 38.7 37.5 - 51.0 % Final   MCHC  Date Value Ref Range Status  07/26/2023 32.4 30.0 - 36.0 g/dL Final   [facility]  Date Value Ref Range Status  07/26/2023 30.0 26.0 - 34.0 pg Final   MCV  Date Value Ref Range Status  07/26/2023 92.8 80.0 - 100.0 fL Final  07/12/2022 88 79 - 97 fL Final  07/07/2014 90 80 - 100 fL Final   No results found for: "PLTCOUNTKUC", "LABPLAT", "POCPLA" RDW  Date Value Ref Range Status  07/26/2023 14.6 11.5 - 15.5 % Final  07/12/2022 13.1 11.6 - 15.4 % Final  07/07/2014 13.5 11.5 - 14.5 % Final         Passed - Cr in normal range and within 360 days    Creatinine  Date Value Ref Range Status  07/07/2014 1.19 0.60 - 1.30 mg/dL Final   Creatinine, Ser  Date Value Ref Range Status  07/26/2023 1.24 0.61 - 1.24 mg/dL Final         Passed - eGFR in normal range and within 360 days    EGFR (African American)  Date Value Ref Range Status  07/07/2014 >60 >56mL/min Final  12/15/2013 >60  Final   GFR calc Af Amer  Date Value Ref Range Status  07/18/2020 107 >59 mL/min/1.73 Final    Comment:     **In accordance with recommendations from the NKF-ASN Task force,**   [facility] is in the process of updating its eGFR calculation to the   2021 CKD-EPI creatinine equation that estimates kidney function   without a race variable.    EGFR (Non-African Amer.)  Date Value Ref Range Status  07/07/2014 >60 >65mL/min Final    Comment:    eGFR values <51mL/min/1.73 m2 may be an indication of chronic kidney disease (CKD). Calculated eGFR, using the MRDR Study equation, is useful in  patients with stable renal function. The eGFR calculation will not be reliable in acutely ill patients when serum creatinine is changing rapidly. It is not useful in patients on dialysis. The eGFR calculation may not be applicable to patients at the low and high extremes of body sizes, pregnant women, and vegetarians.   12/15/2013 >60  Final    Comment:    eGFR values <44mL/min/1.73 m2 may be an indication of chronic kidney disease (CKD). Calculated eGFR is useful in patients with stable renal function. The eGFR calculation will not be reliable in acutely ill patients when serum creatinine is  changing rapidly. It is not useful in  patients on dialysis. The eGFR calculation may not be applicable to patients at the low and high extremes of body sizes, pregnant women, and vegetarians.    GFR, Estimated  Date Value Ref Range Status  07/26/2023 >60 >60 mL/min Final    Comment:    (NOTE) Calculated using the CKD-EPI Creatinine Equation (2021)    eGFR  Date Value Ref Range Status  12/30/2022 91 >59 mL/min/1.73 Final         Passed - B12 Level in normal range and within 720 days    Vitamin B-12  Date Value Ref Range Status  04/10/2022 1,132 232 - 1,245 pg/mL Final          lisinopril (ZESTRIL) 5 MG tablet 30 tablet 0    Sig: TAKE 1 TABLET BY MOUTH ONCE DAILY     Cardiovascular:  ACE Inhibitors Failed - 07/29/2023  9:19 AM      Failed - Last BP in normal range    BP Readings from Last 1 Encounters:   07/26/23 (!) 125/91         Failed - Valid encounter within last 6 months    Recent Outpatient Visits           7 months ago Type 2 diabetes mellitus with proteinuria (HCC)   [place] [facility] [place], [name] T, NP   1 year ago Medicare annual wellness visit, subsequent   [place] [facility] [place], [place] T, NP   1 year ago Type 2 diabetes mellitus with proteinuria (HCC)   [place] [facility] [place], [name] T, NP   1 year ago Type 2 diabetes mellitus with proteinuria (HCC)   [place] [place], [name] T, NP   2 years ago Great toe pain, left   [place] [facility], Lauren A, NP              Passed - Cr in normal range and within 180 days    Creatinine  Date Value Ref Range Status  07/07/2014 1.19 0.60 - 1.30 mg/dL Final   Creatinine, Ser  Date Value Ref Range Status  07/26/2023 1.24 0.61 - 1.24 mg/dL Final         Passed - K in normal range and within 180 days    Potassium  Date Value Ref Range Status  07/26/2023 3.8 3.5 - 5.1 mmol/L Final  07/07/2014 3.7 3.5 - 5.1 mmol/L Final         Passed - Patient is not pregnant       carvedilol (COREG) 6.25 MG tablet 60 tablet 0    Sig: TAKE 1 TABLET BY MOUTH TWICE DAILY WITH A MEAL     Cardiovascular: Beta Blockers 3 Failed - 07/29/2023  9:19 AM      Failed - Last BP in normal range    BP Readings from Last 1 Encounters:  07/26/23 (!) 125/91         Failed - Valid encounter within last 6 months    Recent Outpatient Visits           7 months ago Type 2 diabetes mellitus with proteinuria (HCC)    [place] [place], [name] T, NP   1 year ago Medicare annual wellness visit, subsequent    [facility] [place] [place] T, NP   1 year ago Type 2 diabetes mellitus with proteinuria (HCC)   [facility] [place], [name] T, NP   1 year ago Type 2  diabetes mellitus with proteinuria (HCC)   [place] [facility] [place], [name] T, NP   2 years ago Great toe pain, left   [place] [facility], Lauren A, NP              Passed - Cr in normal range and within 360 days    Creatinine  Date Value Ref Range Status  07/07/2014 1.19 0.60 - 1.30 mg/dL Final   Creatinine, Ser  Date Value Ref Range Status  07/26/2023 1.24 0.61 - 1.24 mg/dL Final         Passed - AST in normal range and within 360 days    AST  Date Value Ref Range Status  12/30/2022 10 0 - 40 IU/L Final   SGOT(AST)  Date Value Ref Range Status  07/07/2014 10 (L) 15 - 37 Unit/L Final         Passed - ALT in normal range and within 360 days    ALT  Date Value Ref Range Status  12/30/2022 10 0 - 44 IU/L Final   SGPT (ALT)  Date Value Ref Range Status  07/07/2014 15 U/L Final    Comment:    14-63 NOTE: New Reference Range 01/25/14          Passed - Last Heart Rate in normal range    Pulse Readings from Last 1 Encounters:  07/26/23 78          amLODipine (NORVASC) 10 MG tablet 30 tablet 0    Sig: TAKE 1 TABLET BY MOUTH ONCE DAILY     Cardiovascular: Calcium Channel Blockers 2 Failed - 07/29/2023  9:19 AM      Failed - Last BP in normal range    BP Readings from Last 1 Encounters:  07/26/23 (!) 125/91         Failed - Valid encounter within last 6 months    Recent Outpatient Visits           7 months ago Type 2 diabetes mellitus with proteinuria (HCC)   [place] [facility] [place], [name] T, NP   1 year ago Medicare annual wellness visit, subsequent   [place] [facility] [place], [name] T, NP   1 year ago Type 2 diabetes mellitus with proteinuria (HCC)   [place] [facility] [place], [name] T, NP   1 year ago Type 2 diabetes mellitus with proteinuria (HCC)   [place] [facility] [place], [name] T, NP   2 years ago Great toe pain, left    [place] [facility] [place], Lauren A, NP              Passed - Last Heart Rate in normal range    Pulse Readings from Last 1 Encounters:  07/26/23 78          Accu-Chek Softclix Lancets lancets 100 each 12    Sig: USE 2-3 TIMES A DAY AS DIRECTED     Endocrinology: Diabetes - Testing Supplies Passed - 07/29/2023  9:19 AM      Passed - Valid encounter within last 12 months    Recent Outpatient Visits           7 months ago Type 2 diabetes mellitus with proteinuria (HCC)   [facility] [place], [name] T, NP   1 year ago Medicare annual wellness  visit, subsequent   [place], [place] T, NP   1 year ago Type 2 diabetes mellitus with proteinuria (HCC)   [place] [facility] [place], [name] T, NP   1 year ago Type 2 diabetes mellitus with proteinuria (HCC)   [place] [facility] [place], [name] T, NP   2 years ago Great toe pain, left   [place] [facility] [place], Lauren A, NP               ACCU-CHEK GUIDE TEST test strip 100 each 12    Sig: USE TO CHECK BLOOD SUGAR 3 TIMES DAILY     There is no refill protocol information for this order

## 2023-07-29 NOTE — Telephone Encounter (Signed)
Patient overdue appointment- attempted to call patient to schedule- no answer- left message to call office for appointment .  Requested Prescriptions  Pending Prescriptions Disp Refills   metFORMIN (GLUCOPHAGE-XR) 500 MG 24 hr tablet [Pharmacy Med Name: METFORMIN HCL ER 500 MG TAB] 120 tablet 0    Sig: TAKE 2 TABLETS BY MOUTH TWICE DAILY WITHA MEAL     Endocrinology:  Diabetes - Biguanides Failed - 07/29/2023  9:18 AM      Failed - HBA1C is between 0 and 7.9 and within 180 days    Hemoglobin A1C  Date Value Ref Range Status  09/27/2015 6.5  Final   HB A1C (BAYER DCA - WAIVED)  Date Value Ref Range Status  07/12/2022 6.1 (H) 4.8 - 5.6 % Final    Comment:             Prediabetes: 5.7 - 6.4          Diabetes: >6.4          Glycemic control for adults with diabetes: <7.0    Hgb A1c MFr Bld  Date Value Ref Range Status  12/30/2022 5.5 4.8 - 5.6 % Final    Comment:             Prediabetes: 5.7 - 6.4          Diabetes: >6.4          Glycemic control for adults with diabetes: <7.0          Failed - Valid encounter within last 6 months    Recent Outpatient Visits           7 months ago Type 2 diabetes mellitus with proteinuria (HCC)   [place] [facility] [place], [name] T, NP   1 year ago Medicare annual wellness visit, subsequent   [place] [facility] [place], [place] T, NP   1 year ago Type 2 diabetes mellitus with proteinuria (HCC)   [place] [facility] [place], [name] T, NP   1 year ago Type 2 diabetes mellitus with proteinuria (HCC)   [place] [facility] [place], [name] T, NP   2 years ago Great toe pain, left   [place] [facility], Lauren A, NP              Failed - CBC within normal limits and completed in the last 12 months    WBC  Date Value Ref Range Status  07/26/2023 11.4 (H) 4.0 - 10.5 K/uL Final   RBC  Date Value Ref Range Status  07/26/2023 4.56 4.22 - 5.81  MIL/uL Final   Hemoglobin  Date Value Ref Range Status  07/26/2023 13.7 13.0 - 17.0 g/dL Final  27/25/3664 40.3 13.0 - 17.7 g/dL Final   HCT  Date Value Ref Range Status  07/26/2023 42.3 39.0 - 52.0 % Final   Hematocrit  Date Value Ref Range Status  07/12/2022 38.7 37.5 - 51.0 % Final   MCHC  Date Value Ref Range Status  07/26/2023 32.4 30.0 - 36.0 g/dL Final   [facility]  Date Value Ref Range Status  07/26/2023 30.0 26.0 - 34.0 pg Final   MCV  Date Value Ref Range Status  07/26/2023 92.8 80.0 - 100.0 fL Final  07/12/2022 88 79 - 97 fL Final  07/07/2014 90 80 - 100 fL Final   No results found for: "PLTCOUNTKUC", "LABPLAT", "POCPLA" RDW  Date Value Ref Range Status  07/26/2023 14.6 11.5 - 15.5 %  Final  07/12/2022 13.1 11.6 - 15.4 % Final  07/07/2014 13.5 11.5 - 14.5 % Final         Passed - Cr in normal range and within 360 days    Creatinine  Date Value Ref Range Status  07/07/2014 1.19 0.60 - 1.30 mg/dL Final   Creatinine, Ser  Date Value Ref Range Status  07/26/2023 1.24 0.61 - 1.24 mg/dL Final         Passed - eGFR in normal range and within 360 days    EGFR (African American)  Date Value Ref Range Status  07/07/2014 >60 >38mL/min Final  12/15/2013 >60  Final   GFR calc Af Amer  Date Value Ref Range Status  07/18/2020 107 >59 mL/min/1.73 Final    Comment:    **In accordance with recommendations from the NKF-ASN Task force,**   [facility] is in the process of updating its eGFR calculation to the   2021 CKD-EPI creatinine equation that estimates kidney function   without a race variable.    EGFR (Non-African Amer.)  Date Value Ref Range Status  07/07/2014 >60 >96mL/min Final    Comment:    eGFR values <41mL/min/1.73 m2 may be an indication of chronic kidney disease (CKD). Calculated eGFR, using the MRDR Study equation, is useful in  patients with stable renal function. The eGFR calculation will not be reliable in acutely ill patients when serum  creatinine is changing rapidly. It is not useful in patients on dialysis. The eGFR calculation may not be applicable to patients at the low and high extremes of body sizes, pregnant women, and vegetarians.   12/15/2013 >60  Final    Comment:    eGFR values <70mL/min/1.73 m2 may be an indication of chronic kidney disease (CKD). Calculated eGFR is useful in patients with stable renal function. The eGFR calculation will not be reliable in acutely ill patients when serum creatinine is changing rapidly. It is not useful in  patients on dialysis. The eGFR calculation may not be applicable to patients at the low and high extremes of body sizes, pregnant women, and vegetarians.    GFR, Estimated  Date Value Ref Range Status  07/26/2023 >60 >60 mL/min Final    Comment:    (NOTE) Calculated using the CKD-EPI Creatinine Equation (2021)    eGFR  Date Value Ref Range Status  12/30/2022 91 >59 mL/min/1.73 Final         Passed - B12 Level in normal range and within 720 days    Vitamin B-12  Date Value Ref Range Status  04/10/2022 1,132 232 - 1,245 pg/mL Final          GEMTESA 75 MG TABS [Pharmacy Med Name: GEMTESA 75 MG TAB] 90 tablet 3    Sig: TAKE 1 TABLET BY MOUTH ONCE DAILY     Off-Protocol Failed - 07/29/2023  9:18 AM      Failed - Medication not assigned to a protocol, review manually.      Passed - Valid encounter within last 12 months    Recent Outpatient Visits           7 months ago Type 2 diabetes mellitus with proteinuria (HCC)   [place] [facility] [place], [name] T, NP   1 year ago Medicare annual wellness visit, subsequent   [place] [facility] [place], [name] T, NP   1 year ago Type 2 diabetes mellitus with proteinuria [facility])   [place] [facility] [name], NP  1 year ago Type 2 diabetes mellitus with proteinuria (HCC)   [place] [facility] [place], [name] T, NP   2 years ago Great toe  pain, left   [place] [facility] [place], Lauren A, NP               lisinopril (ZESTRIL) 5 MG tablet [Pharmacy Med Name: LISINOPRIL 5 MG TAB] 30 tablet 0    Sig: TAKE 1 TABLET BY MOUTH ONCE DAILY     Cardiovascular:  ACE Inhibitors Failed - 07/29/2023  9:18 AM      Failed - Last BP in normal range    BP Readings from Last 1 Encounters:  07/26/23 (!) 125/91         Failed - Valid encounter within last 6 months    Recent Outpatient Visits           7 months ago Type 2 diabetes mellitus with proteinuria (HCC)   [place] [facility] [place], [name] T, NP   1 year ago Medicare annual wellness visit, subsequent   [place] [facility] [place], [place] T, NP   1 year ago Type 2 diabetes mellitus with proteinuria (HCC)   [place] [facility] [place], [name] T, NP   1 year ago Type 2 diabetes mellitus with proteinuria (HCC)   [place] [facility] [place], [name] T, NP   2 years ago Great toe pain, left   [place] [facility], Lauren A, NP              Passed - Cr in normal range and within 180 days    Creatinine  Date Value Ref Range Status  07/07/2014 1.19 0.60 - 1.30 mg/dL Final   Creatinine, Ser  Date Value Ref Range Status  07/26/2023 1.24 0.61 - 1.24 mg/dL Final         Passed - K in normal range and within 180 days    Potassium  Date Value Ref Range Status  07/26/2023 3.8 3.5 - 5.1 mmol/L Final  07/07/2014 3.7 3.5 - 5.1 mmol/L Final         Passed - Patient is not pregnant       carvedilol (COREG) 6.25 MG tablet [Pharmacy Med Name: CARVEDILOL 6.25 MG TAB] 60 tablet 0    Sig: TAKE 1 TABLET BY MOUTH TWICE DAILY WITH A MEAL     Cardiovascular: Beta Blockers 3 Failed - 07/29/2023  9:18 AM      Failed - Last BP in normal range    BP Readings from Last 1 Encounters:  07/26/23 (!) 125/91         Failed - Valid encounter within last 6 months    Recent  Outpatient Visits           7 months ago Type 2 diabetes mellitus with proteinuria (HCC)   [place] [facility] [place], [name] T, NP   1 year ago Medicare annual wellness visit, subsequent   [place] [facility] [place], [name] T, NP   1 year ago Type 2 diabetes mellitus with proteinuria (HCC)   [place] [facility] [place], [name] T, NP   1 year ago Type 2 diabetes mellitus with proteinuria (HCC)   [place] [facility] [place], [name] T, NP   2 years ago Great toe pain, left   [place] [facility] Gerre Scull, NP  Passed - Cr in normal range and within 360 days    Creatinine  Date Value Ref Range Status  07/07/2014 1.19 0.60 - 1.30 mg/dL Final   Creatinine, Ser  Date Value Ref Range Status  07/26/2023 1.24 0.61 - 1.24 mg/dL Final         Passed - AST in normal range and within 360 days    AST  Date Value Ref Range Status  12/30/2022 10 0 - 40 IU/L Final   SGOT(AST)  Date Value Ref Range Status  07/07/2014 10 (L) 15 - 37 Unit/L Final         Passed - ALT in normal range and within 360 days    ALT  Date Value Ref Range Status  12/30/2022 10 0 - 44 IU/L Final   SGPT (ALT)  Date Value Ref Range Status  07/07/2014 15 U/L Final    Comment:    14-63 NOTE: New Reference Range 01/25/14          Passed - Last Heart Rate in normal range    Pulse Readings from Last 1 Encounters:  07/26/23 78          amLODipine (NORVASC) 10 MG tablet [Pharmacy Med Name: AMLODIPINE BESYLATE 10 MG TAB] 30 tablet 0    Sig: TAKE 1 TABLET BY MOUTH ONCE DAILY     Cardiovascular: Calcium Channel Blockers 2 Failed - 07/29/2023  9:18 AM      Failed - Last BP in normal range    BP Readings from Last 1 Encounters:  07/26/23 (!) 125/91         Failed - Valid encounter within last 6 months    Recent Outpatient Visits           7 months ago Type 2 diabetes mellitus with proteinuria (HCC)    [place] [facility] [place], [name] T, NP   1 year ago Medicare annual wellness visit, subsequent   [place] [facility] [place], [place] T, NP   1 year ago Type 2 diabetes mellitus with proteinuria (HCC)   [place] [facility] [place], [name] T, NP   1 year ago Type 2 diabetes mellitus with proteinuria (HCC)   [place] [facility] [place], [place] T, NP   2 years ago Great toe pain, left   [place] [facility] [place], Lauren A, NP              Passed - Last Heart Rate in normal range    Pulse Readings from Last 1 Encounters:  07/26/23 78          Accu-Chek Softclix Lancets lancets [Pharmacy Med Name: ACCU-CHEK SOFTCLIX LANCETS] 100 each 12    Sig: USE 2-3 TIMES A DAY AS DIRECTED     Endocrinology: Diabetes - Testing Supplies Passed - 07/29/2023  9:18 AM      Passed - Valid encounter within last 12 months    Recent Outpatient Visits           7 months ago Type 2 diabetes mellitus with proteinuria (HCC)   [place] [facility] [place], [name] T, NP   1 year ago Medicare annual wellness visit, subsequent   [place] [facility] [place], [place] T, NP   1 year ago Type 2 diabetes mellitus with proteinuria (HCC)   [place] [facility] [place], [place] T, NP   1 year ago Type 2 diabetes mellitus with proteinuria (HCC)   [facility] [place], [name] T, NP   2 years ago Great toe pain, left   [place] [facility], Lauren A, NP               ACCU-CHEK GUIDE TEST test strip [Pharmacy Med Name: ACCU-CHEK GUIDE TEST STRIP] 100 each 12    Sig: USE TO CHECK BLOOD SUGAR 3 TIMES DAILY     There is no refill protocol information for this order

## 2023-07-29 NOTE — Transitions of Care (Post Inpatient/ED Visit) (Unsigned)
   07/29/2023  Name: [name] MRN: [id number] DOB: Sep 11, 1970  Today's TOC FU Call Status: Today's TOC FU Call Status:: Unsuccessful Call (1st Attempt) Unsuccessful Call (1st Attempt) Date: 07/29/23  Attempted to reach the patient regarding the most recent Inpatient/ED visit.  Follow Up Plan: Additional outreach attempts will be made to reach the patient to complete the Transitions of Care (Post Inpatient/ED visit) call.   Signature: Wilhemena Durie, CMA

## 2023-07-30 NOTE — Transitions of Care (Post Inpatient/ED Visit) (Unsigned)
   07/30/2023  Name: [name] MRN: [id number] DOB: [birth year]  Today's TOC FU Call Status: Today's TOC FU Call Status:: Unsuccessful Call (2nd Attempt) Unsuccessful Call (1st Attempt) Date: 07/29/23 Unsuccessful Call (2nd Attempt) Date: 07/30/23  Attempted to reach the patient regarding the most recent Inpatient/ED visit.  Follow Up Plan: Additional outreach attempts will be made to reach the patient to complete the Transitions of Care (Post Inpatient/ED visit) call.   Signature: Wilhemena Durie, CMA

## 2023-07-31 NOTE — Transitions of Care (Post Inpatient/ED Visit) (Signed)
   07/31/2023  Name: [name] MRN: [id number] DOB: 02/09/1971  Today's TOC FU Call Status: Today's TOC FU Call Status:: Unsuccessful Call (3rd Attempt) Unsuccessful Call (1st Attempt) Date: 07/29/23 Unsuccessful Call (2nd Attempt) Date: 07/30/23 Unsuccessful Call (3rd Attempt) Date: 07/31/23  Attempted to reach the patient regarding the most recent Inpatient/ED visit.  Follow Up Plan: No further outreach attempts will be made at this time. We have been unable to contact the patient.  Signature: Wilhemena Durie, CMA

## 2023-08-31 ENCOUNTER — Emergency Department: Payer: Medicare Other

## 2023-08-31 ENCOUNTER — Other Ambulatory Visit: Payer: Self-pay

## 2023-08-31 ENCOUNTER — Emergency Department
Admission: EM | Admit: 2023-08-31 | Discharge: 2023-08-31 | Disposition: A | Discharge status: Home / Self Care | Payer: Medicare Other | Attending: Student in an Organized Health Care Education/Training Program | Admitting: Student in an Organized Health Care Education/Training Program

## 2023-08-31 DIAGNOSIS — Z8673 Personal history of transient ischemic attack (TIA), and cerebral infarction without residual deficits: Secondary | ICD-10-CM | POA: Diagnosis not present

## 2023-08-31 DIAGNOSIS — R531 Weakness: Secondary | ICD-10-CM | POA: Insufficient documentation

## 2023-08-31 DIAGNOSIS — R4182 Altered mental status, unspecified: Secondary | ICD-10-CM | POA: Insufficient documentation

## 2023-08-31 DIAGNOSIS — G9389 Other specified disorders of brain: Secondary | ICD-10-CM | POA: Diagnosis not present

## 2023-08-31 DIAGNOSIS — R410 Disorientation, unspecified: Secondary | ICD-10-CM | POA: Insufficient documentation

## 2023-08-31 LAB — CBC WITH DIFFERENTIAL/PLATELET
Abs Immature Granulocytes: 0.02 10*3/uL (ref 0.00–0.07)
Basophils Absolute: 0 10*3/uL (ref 0.0–0.1)
Basophils Relative: 0 %
Eosinophils Absolute: 0.1 10*3/uL (ref 0.0–0.5)
Eosinophils Relative: 1 %
HCT: 44.2 % (ref 39.0–52.0)
Hemoglobin: 14.2 g/dL (ref 13.0–17.0)
Immature Granulocytes: 0 %
Lymphocytes Relative: 9 %
Lymphs Abs: 0.4 10*3/uL — ABNORMAL LOW (ref 0.7–4.0)
MCH: 29.4 pg (ref 26.0–34.0)
MCHC: 32.1 g/dL (ref 30.0–36.0)
MCV: 91.5 fL (ref 80.0–100.0)
Monocytes Absolute: 0.6 10*3/uL (ref 0.1–1.0)
Monocytes Relative: 13 %
Neutro Abs: 3.8 10*3/uL (ref 1.7–7.7)
Neutrophils Relative %: 77 %
Platelets: 193 10*3/uL (ref 150–400)
RBC: 4.83 MIL/uL (ref 4.22–5.81)
RDW: 13.9 % (ref 11.5–15.5)
WBC: 5 10*3/uL (ref 4.0–10.5)
nRBC: 0 % (ref 0.0–0.2)

## 2023-08-31 LAB — COMPREHENSIVE METABOLIC PANEL
ALT: 19 U/L (ref 0–44)
AST: 19 U/L (ref 15–41)
Albumin: 3.9 g/dL (ref 3.5–5.0)
Alkaline Phosphatase: 55 U/L (ref 38–126)
Anion gap: 12 (ref 5–15)
BUN: 31 mg/dL — ABNORMAL HIGH (ref 6–20)
CO2: 25 mmol/L (ref 22–32)
Calcium: 8.9 mg/dL (ref 8.9–10.3)
Chloride: 100 mmol/L (ref 98–111)
Creatinine, Ser: 1.35 mg/dL — ABNORMAL HIGH (ref 0.61–1.24)
GFR, Estimated: >60 mL/min (ref >60)
Glucose, Bld: 102 mg/dL — ABNORMAL HIGH (ref 70–99)
Potassium: 4.6 mmol/L (ref 3.5–5.1)
Sodium: 137 mmol/L (ref 135–145)
Total Bilirubin: 1 mg/dL (ref 0.0–1.2)
Total Protein: 7.8 g/dL (ref 6.5–8.1)

## 2023-08-31 LAB — URINALYSIS, ROUTINE W REFLEX MICROSCOPIC
Bilirubin Urine: NEGATIVE
Glucose, UA: NEGATIVE mg/dL
Ketones, ur: NEGATIVE mg/dL
Leukocytes,Ua: NEGATIVE
Nitrite: NEGATIVE
Protein, ur: NEGATIVE mg/dL
Specific Gravity, Urine: 1.014 (ref 1.005–1.030)
Squamous Epithelial / HPF: 0 /[HPF] (ref 0–5)
pH: 5 (ref 5.0–8.0)

## 2023-08-31 LAB — TROPONIN I (HIGH SENSITIVITY)
Troponin I (High Sensitivity): <2 ng/L (ref <18)
Troponin I (High Sensitivity): <2 ng/L (ref <18)

## 2023-08-31 LAB — RESP PANEL BY RT-PCR (RSV, FLU A&B, COVID)  RVPGX2
Influenza A by PCR: NEGATIVE
Influenza B by PCR: NEGATIVE
Resp Syncytial Virus by PCR: NEGATIVE
SARS Coronavirus 2 by RT PCR: NEGATIVE

## 2023-08-31 MED ORDER — SODIUM CHLORIDE 0.9 % IV BOLUS
1000.0000 mL | Freq: Once | INTRAVENOUS | Status: AC
  Administered 2023-08-31: 1000 mL via INTRAVENOUS

## 2023-08-31 MED ORDER — CEPHALEXIN 500 MG PO CAPS: 500.0000 mg | ORAL_CAPSULE | Freq: Two times a day (BID) | ORAL | 0 refills | Status: AC

## 2023-08-31 NOTE — ED Triage Notes (Signed)
 Pt to ED via [facility] from home for c/o weakness and AMS that began this morning, foul odor with urination for 2 weeks. Pt has hx of stroke with right side deficits.   Temp 99.1 HR 107 BP 157/96 O2 96% on room air Cbg 101

## 2023-08-31 NOTE — ED Notes (Addendum)
 This RN spoke with Bea Graff, pts brother, over the phone. [name] said he was unable to find anyone to pick pt up from hospital.

## 2023-08-31 NOTE — ED Notes (Signed)
 Attempted to call pt contacts twice. Left message requesting a call back.

## 2023-08-31 NOTE — ED Provider Notes (Signed)
 [facility] Provider Note    Event Date/Time   First MD Initiated Contact with Patient 08/31/23 [phone]     (approximate)   History   Weakness and Altered Mental Status   HPI  [name] is a 53 y.o. male with a history of stroke, seizures, diastolic dysfunction presents to the ER for evaluation.  Patient denies any symptoms.  Reportedly his brother called EMS.  Reportedly more confused this morning.  Reportedly has been having some foul-smelling urine.     Physical Exam   Triage Vital Signs: ED Triage Vitals  Encounter Vitals Group     BP      Systolic BP Percentile      Diastolic BP Percentile      Pulse      Resp      Temp      Temp src      SpO2      Weight      Height      Head Circumference      Peak Flow      Pain Score      Pain Loc      Pain Education      Exclude from Growth Chart     Most recent vital signs: Vitals:   08/31/23 0932  BP: 130/86  Pulse: 100  Resp: (!) 21  Temp: 99 F (37.2 C)  SpO2: 95%     Constitutional: Alert  Eyes: Conjunctivae are normal.  Head: Atraumatic. Nose: No congestion/rhinnorhea. Mouth/Throat: Mucous membranes are moist.   Neck: Painless ROM.  Cardiovascular:   Good peripheral circulation. Respiratory: Normal respiratory effort.  No retractions.  Gastrointestinal: Soft and nontender.  Musculoskeletal:  no deformity Neurologic:  left sided weakness, responding to questions appropriately. Skin:  Skin is warm, dry and intact. No rash noted. Psychiatric: Mood and affect are normal. Speech and behavior are normal.    ED Results / Procedures / Treatments   Labs (all labs ordered are listed, but only abnormal results are displayed) Labs Reviewed  CBC WITH DIFFERENTIAL/PLATELET - Abnormal; Notable for the following components:      Result Value   Lymphs Abs 0.4 (*)    All other components within normal limits  COMPREHENSIVE METABOLIC PANEL - Abnormal; Notable for the following  components:   Glucose, Bld 102 (*)    BUN 31 (*)    Creatinine, Ser 1.35 (*)    All other components within normal limits  URINALYSIS, ROUTINE W REFLEX MICROSCOPIC - Abnormal; Notable for the following components:   Color, Urine STRAW (*)    APPearance CLEAR (*)    Hgb urine dipstick SMALL (*)    Bacteria, UA RARE (*)    All other components within normal limits  RESP PANEL BY RT-PCR (RSV, FLU A&B, COVID)  [id number]  TROPONIN I (HIGH SENSITIVITY)  TROPONIN I (HIGH SENSITIVITY)     EKG  ED ECG REPORT I, Willy Eddy, the attending physician, personally viewed and interpreted this ECG.   Date: 08/31/2023  EKG Time: 9:30  Rate: 100  Rhythm: sinus  Axis: normal  Intervals: normal  ST&T Change: no stemi, no depressions    RADIOLOGY Please see ED Course for my review and interpretation.  I personally reviewed all radiographic images ordered to evaluate for the above acute complaints and reviewed radiology reports and findings.  These findings were personally discussed with the patient.  Please see medical record for radiology report.    PROCEDURES:  Critical Care performed: No  Procedures   MEDICATIONS ORDERED IN ED: Medications  sodium chloride 0.9 % bolus 1,000 mL (1,000 mLs Intravenous New Bag/Given 08/31/23 1109)     IMPRESSION / MDM / ASSESSMENT AND PLAN / ED COURSE  I reviewed the triage vital signs and the nursing notes.                              Differential diagnosis includes, but is not limited to, Dehydration, sepsis, pna, uti, hypoglycemia, cva, drug effect, withdrawal, encephalitis  Patient presenting to the ER for evaluation of symptoms as described above.  Based on symptoms, risk factors and considered above differential, this presenting complaint could reflect a potentially life-threatening illness therefore the patient will be placed on continuous pulse oximetry and telemetry for monitoring.  Laboratory evaluation will be sent to evaluate for  the above complaints.  Imaging will be sent for the but differential.    Clinical Course as of 08/31/23 1309  Sun Aug 31, 2023  1033 Chest x-ray on my review and interpretation without evidence of consolidation. [PR]  1119 Blood work overall is reassuring.  Troponin negative.  RSV negative.  No leukocytosis.  CT imaging without evidence of acute abnormality.  Still awaiting urinalysis.  Possible dehydration.  Will give IV fluids. [PR]  1301 UA with some rare bacteria.  Given report of cloudy urine and altered mental status will cover for UTI but workup otherwise reassuring.  Patient does appear stable appropriate for discharge. [PR]    Clinical Course User Index [PR] Willy Eddy, MD     FINAL CLINICAL IMPRESSION(S) / ED DIAGNOSES   Final diagnoses:  Confusion     Rx / DC Orders   ED Discharge Orders          Ordered    cephALEXin (KEFLEX) 500 MG capsule  2 times daily        08/31/23 1307             Note:  This document was prepared using Dragon voice recognition software and may include unintentional dictation errors.    Willy Eddy, MD 08/31/23 [id number]

## 2023-09-08 ENCOUNTER — Other Ambulatory Visit: Payer: Self-pay | Admitting: Nurse Practitioner

## 2023-09-09 ENCOUNTER — Other Ambulatory Visit: Payer: Self-pay

## 2023-09-09 ENCOUNTER — Telehealth: Payer: Self-pay

## 2023-09-09 MED ORDER — ASPIRIN 81 MG PO TBEC: 81.0000 mg | DELAYED_RELEASE_TABLET | Freq: Every day | ORAL | 0 refills | Status: DC

## 2023-09-09 NOTE — Telephone Encounter (Signed)
 PA for [facility] initiated and submitted via Cover My Meds. Key: [id number]

## 2023-09-09 NOTE — Telephone Encounter (Signed)
 Requested medication (s) are due for refill today: Yes  Requested medication (s) are on the active medication list: Yes  Last refill:    Future visit scheduled: No  Notes to clinic:  Unable to leave message for pt. To make appointment.    Requested Prescriptions  Pending Prescriptions Disp Refills   amLODipine (NORVASC) 10 MG tablet [Pharmacy Med Name: AMLODIPINE BESYLATE 10 MG TAB] 30 tablet 0    Sig: TAKE 1 TABLET BY MOUTH ONCE DAILY     Cardiovascular: Calcium Channel Blockers 2 Failed - 09/09/2023  3:36 PM      Failed - Last BP in normal range    BP Readings from Last 1 Encounters:  08/31/23 (!) 148/95         Failed - Valid encounter within last 6 months    Recent Outpatient Visits           8 months ago Type 2 diabetes mellitus with proteinuria (HCC)   [place] [facility] [place], [name] T, NP   1 year ago Medicare annual wellness visit, subsequent   [place] [facility] [place], Brigantine T, NP   1 year ago Type 2 diabetes mellitus with proteinuria (HCC)   [place] [facility] [place], [name] T, NP   1 year ago Type 2 diabetes mellitus with proteinuria (HCC)   [place] [facility] [place], [name] T, NP   2 years ago Great toe pain, left   [place] [facility] [place], Lauren A, NP              Passed - Last Heart Rate in normal range    Pulse Readings from Last 1 Encounters:  08/31/23 95          carvedilol (COREG) 6.25 MG tablet [Pharmacy Med Name: CARVEDILOL 6.25 MG TAB] 60 tablet 0    Sig: TAKE 1 TABLET BY MOUTH TWICE DAILY WITH A MEAL     Cardiovascular: Beta Blockers 3 Failed - 09/09/2023  3:36 PM      Failed - Cr in normal range and within 360 days    Creatinine  Date Value Ref Range Status  07/07/2014 1.19 0.60 - 1.30 mg/dL Final   Creatinine, Ser  Date Value Ref Range Status  08/31/2023 1.35 (H) 0.61 - 1.24 mg/dL Final         Failed - Last BP in normal range     BP Readings from Last 1 Encounters:  08/31/23 (!) 148/95         Failed - Valid encounter within last 6 months    Recent Outpatient Visits           8 months ago Type 2 diabetes mellitus with proteinuria (HCC)   [place] [facility] [place], [name] T, NP   1 year ago Medicare annual wellness visit, subsequent   [place] [facility] [place], [name] T, NP   1 year ago Type 2 diabetes mellitus with proteinuria (HCC)   [place] [facility] [place], [name] T, NP   1 year ago Type 2 diabetes mellitus with proteinuria (HCC)   [place] [facility] [place], [name] T, NP   2 years ago Great toe pain, left   [place] [facility], Lauren A, NP              Passed - AST in normal range and within 360 days    AST  Date Value Ref  Range Status  08/31/2023 19 15 - 41 U/L Final    Comment:    HEMOLYSIS AT THIS LEVEL MAY AFFECT RESULT   SGOT(AST)  Date Value Ref Range Status  07/07/2014 10 (L) 15 - 37 Unit/L Final         Passed - ALT in normal range and within 360 days    ALT  Date Value Ref Range Status  08/31/2023 19 0 - 44 U/L Final    Comment:    HEMOLYSIS AT THIS LEVEL MAY AFFECT RESULT   SGPT (ALT)  Date Value Ref Range Status  07/07/2014 15 U/L Final    Comment:    14-63 NOTE: New Reference Range 01/25/14          Passed - Last Heart Rate in normal range    Pulse Readings from Last 1 Encounters:  08/31/23 95          lisinopril (ZESTRIL) 5 MG tablet [Pharmacy Med Name: LISINOPRIL 5 MG TAB] 30 tablet 0    Sig: TAKE 1 TABLET BY MOUTH ONCE DAILY     Cardiovascular:  ACE Inhibitors Failed - 09/09/2023  3:36 PM      Failed - Cr in normal range and within 180 days    Creatinine  Date Value Ref Range Status  07/07/2014 1.19 0.60 - 1.30 mg/dL Final   Creatinine, Ser  Date Value Ref Range Status  08/31/2023 1.35 (H) 0.61 - 1.24 mg/dL Final         Failed - Last BP in normal  range    BP Readings from Last 1 Encounters:  08/31/23 (!) 148/95         Failed - Valid encounter within last 6 months    Recent Outpatient Visits           8 months ago Type 2 diabetes mellitus with proteinuria (HCC)   [place] [facility] [place], [name] T, NP   1 year ago Medicare annual wellness visit, subsequent   [place] [facility] [place], [name] T, NP   1 year ago Type 2 diabetes mellitus with proteinuria (HCC)   [place] [facility] [place], [name] T, NP   1 year ago Type 2 diabetes mellitus with proteinuria (HCC)   [place] [facility] [place], [name] T, NP   2 years ago Great toe pain, left   [place] [facility] [place], Lauren A, NP              Passed - K in normal range and within 180 days    Potassium  Date Value Ref Range Status  08/31/2023 4.6 3.5 - 5.1 mmol/L Final    Comment:    HEMOLYSIS AT THIS LEVEL MAY AFFECT RESULT  07/07/2014 3.7 3.5 - 5.1 mmol/L Final         Passed - Patient is not pregnant       metFORMIN (GLUCOPHAGE-XR) 500 MG 24 hr tablet [Pharmacy Med Name: METFORMIN HCL ER 500 MG TAB] 120 tablet 0    Sig: TAKE 2 TABLETS BY MOUTH TWICE DAILY WITHA MEAL **NEED APPOINTMENT FOR MORE REFILLS     Endocrinology:  Diabetes - Biguanides Failed - 09/09/2023  3:36 PM      Failed - Cr in normal range and within 360 days    Creatinine  Date Value Ref Range Status  07/07/2014 1.19 0.60 - 1.30 mg/dL Final   Creatinine, Ser  Date Value Ref Range Status  08/31/2023 1.35 (  H) 0.61 - 1.24 mg/dL Final         Failed - HBA1C is between 0 and 7.9 and within 180 days    Hemoglobin A1C  Date Value Ref Range Status  09/27/2015 6.5  Final   HB A1C (BAYER DCA - WAIVED)  Date Value Ref Range Status  07/12/2022 6.1 (H) 4.8 - 5.6 % Final    Comment:             Prediabetes: 5.7 - 6.4          Diabetes: >6.4          Glycemic control for adults with diabetes: <7.0    Hgb  A1c MFr Bld  Date Value Ref Range Status  12/30/2022 5.5 4.8 - 5.6 % Final    Comment:             Prediabetes: 5.7 - 6.4          Diabetes: >6.4          Glycemic control for adults with diabetes: <7.0          Failed - Valid encounter within last 6 months    Recent Outpatient Visits           8 months ago Type 2 diabetes mellitus with proteinuria (HCC)   [place] [facility] [place], [name] T, NP   1 year ago Medicare annual wellness visit, subsequent   [place] [facility] [place], Elwin T, NP   1 year ago Type 2 diabetes mellitus with proteinuria (HCC)   [place] [facility] [place], [name] T, NP   1 year ago Type 2 diabetes mellitus with proteinuria (HCC)   [place] [facility] [place], [name] T, NP   2 years ago Great toe pain, left   [place] [facility], Lauren A, NP              Failed - CBC within normal limits and completed in the last 12 months    WBC  Date Value Ref Range Status  08/31/2023 5.0 4.0 - 10.5 K/uL Final   RBC  Date Value Ref Range Status  08/31/2023 4.83 4.22 - 5.81 MIL/uL Final   Hemoglobin  Date Value Ref Range Status  08/31/2023 14.2 13.0 - 17.0 g/dL Final  16/04/9603 54.0 13.0 - 17.7 g/dL Final   HCT  Date Value Ref Range Status  08/31/2023 44.2 39.0 - 52.0 % Final   Hematocrit  Date Value Ref Range Status  07/12/2022 38.7 37.5 - 51.0 % Final   MCHC  Date Value Ref Range Status  08/31/2023 32.1 30.0 - 36.0 g/dL Final   [facility]  Date Value Ref Range Status  08/31/2023 29.4 26.0 - 34.0 pg Final   MCV  Date Value Ref Range Status  08/31/2023 91.5 80.0 - 100.0 fL Final  07/12/2022 88 79 - 97 fL Final  07/07/2014 90 80 - 100 fL Final   No results found for: "PLTCOUNTKUC", "LABPLAT", "POCPLA" RDW  Date Value Ref Range Status  08/31/2023 13.9 11.5 - 15.5 % Final  07/12/2022 13.1 11.6 - 15.4 % Final  07/07/2014 13.5 11.5 - 14.5 % Final          Passed - eGFR in normal range and within 360 days    EGFR (African American)  Date Value Ref Range Status  07/07/2014 >60 >39mL/min Final  12/15/2013 >60  Final   GFR calc Af Denyse Dago  Date Value Ref  Range Status  07/18/2020 107 >59 mL/min/1.73 Final    Comment:    **In accordance with recommendations from the NKF-ASN Task force,**   [facility] is in the process of updating its eGFR calculation to the   2021 CKD-EPI creatinine equation that estimates kidney function   without a race variable.    EGFR (Non-African Amer.)  Date Value Ref Range Status  07/07/2014 >60 >41mL/min Final    Comment:    eGFR values <46mL/min/1.73 m2 may be an indication of chronic kidney disease (CKD). Calculated eGFR, using the MRDR Study equation, is useful in  patients with stable renal function. The eGFR calculation will not be reliable in acutely ill patients when serum creatinine is changing rapidly. It is not useful in patients on dialysis. The eGFR calculation may not be applicable to patients at the low and high extremes of body sizes, pregnant women, and vegetarians.   12/15/2013 >60  Final    Comment:    eGFR values <33mL/min/1.73 m2 may be an indication of chronic kidney disease (CKD). Calculated eGFR is useful in patients with stable renal function. The eGFR calculation will not be reliable in acutely ill patients when serum creatinine is changing rapidly. It is not useful in  patients on dialysis. The eGFR calculation may not be applicable to patients at the low and high extremes of body sizes, pregnant women, and vegetarians.    GFR, Estimated  Date Value Ref Range Status  08/31/2023 >60 >60 mL/min Final    Comment:    (NOTE) Calculated using the CKD-EPI Creatinine Equation (2021)    eGFR  Date Value Ref Range Status  12/30/2022 91 >59 mL/min/1.73 Final         Passed - B12 Level in normal range and within 720 days    Vitamin B-12  Date Value Ref Range Status   04/10/2022 1,132 232 - 1,245 pg/mL Final          tamsulosin (FLOMAX) 0.4 MG CAPS capsule [Pharmacy Med Name: TAMSULOSIN HCL 0.4 MG CAP] 90 capsule 4    Sig: TAKE 1 CAPSULE BY MOUTH ONCE DAILY 30 MINUTES AFTER LARGEST MEAL.     Urology: Alpha-Adrenergic Blocker Failed - 09/09/2023  3:36 PM      Failed - Last BP in normal range    BP Readings from Last 1 Encounters:  08/31/23 (!) 148/95         Passed - PSA in normal range and within 360 days    Prostate Specific Ag, Serum  Date Value Ref Range Status  12/30/2022 0.8 0.0 - 4.0 ng/mL Final    Comment:    Roche ECLIA methodology. According to the American Urological Association, Serum PSA should decrease and remain at undetectable levels after radical prostatectomy. The AUA defines biochemical recurrence as an initial PSA value 0.2 ng/mL or greater followed by a subsequent confirmatory PSA value 0.2 ng/mL or greater. Values obtained with different assay methods or kits cannot be used interchangeably. Results cannot be interpreted as absolute evidence of the presence or absence of malignant disease.          Passed - Valid encounter within last 12 months    Recent Outpatient Visits           8 months ago Type 2 diabetes mellitus with proteinuria (HCC)   [place] [facility] [place], [name] T, NP   1 year ago Medicare annual wellness visit, subsequent   [place] [facility] [place], Dorie Rank, NP  1 year ago Type 2 diabetes mellitus with proteinuria (HCC)   [place] [facility] [place], [name] T, NP   1 year ago Type 2 diabetes mellitus with proteinuria (HCC)   [place] [facility] [place], [name] T, NP   2 years ago Great toe pain, left   [place] [facility] [place], Lauren A, NP               gabapentin (NEURONTIN) 300 MG capsule [Pharmacy Med Name: GABAPENTIN 300 MG CAP] 270 capsule 4    Sig: TAKE 1 CAPSULE BY MOUTH 3 TIMES DAILY      Neurology: Anticonvulsants - gabapentin Failed - 09/09/2023  3:36 PM      Failed - Cr in normal range and within 360 days    Creatinine  Date Value Ref Range Status  07/07/2014 1.19 0.60 - 1.30 mg/dL Final   Creatinine, Ser  Date Value Ref Range Status  08/31/2023 1.35 (H) 0.61 - 1.24 mg/dL Final         Passed - Completed PHQ-2 or PHQ-9 in the last 360 days      Passed - Valid encounter within last 12 months    Recent Outpatient Visits           8 months ago Type 2 diabetes mellitus with proteinuria (HCC)   [place] [facility] [place], [name] T, NP   1 year ago Medicare annual wellness visit, subsequent   [place] [place], [place]ce[place] T, NP   1 year ago Type 2 diabetes mellitus with proteinuria (HCC)   [place] [facility] [place], [name] T, NP   1 year ago Type 2 diabetes mellitus with proteinuria (HCC)   [place] [facility] [place], [name] T, NP   2 years ago Great toe pain, left   [place] [facility] [place], Lauren A, NP               donepezil (ARICEPT) 5 MG tablet [Pharmacy Med Name: DONEPEZIL HCL 5 MG TAB] 90 tablet 4    Sig: TAKE 1 TABLET BY MOUTH ONCE DAILY     Neurology:  Alzheimer's Agents Failed - 09/09/2023  3:36 PM      Failed - Valid encounter within last 6 months    Recent Outpatient Visits           8 months ago Type 2 diabetes mellitus with proteinuria (HCC)   [place] [facility] [place], [name] T, NP   1 year ago Medicare annual wellness visit, subsequent   [place] [facility] [place], [place] T, NP   1 year ago Type 2 diabetes mellitus with proteinuria (HCC)   [place] [facility] [place], [name] T, NP   1 year ago Type 2 diabetes mellitus with proteinuria (HCC)   [place] [facility] [place], [name] T, NP   2 years ago Great toe pain, left    [facility] [place], Lauren  A, NP               clopidogrel (PLAVIX) 75 MG tablet [Pharmacy Med Name: CLOPIDOGREL BISULFATE 75 MG TAB] 90 tablet 4    Sig: TAKE 1 TABLET BY MOUTH ONCE EVERY MORNING WITH BREAKFAST     Hematology: Antiplatelets - clopidogrel Failed - 09/09/2023  3:36 PM      Failed - Cr in normal range and within 360 days    Creatinine  Date  Value Ref Range Status  07/07/2014 1.19 0.60 - 1.30 mg/dL Final   Creatinine, Ser  Date Value Ref Range Status  08/31/2023 1.35 (H) 0.61 - 1.24 mg/dL Final         Failed - Valid encounter within last 6 months    Recent Outpatient Visits           8 months ago Type 2 diabetes mellitus with proteinuria (HCC)   [place] [facility] [place], [place] T, NP   1 year ago Medicare annual wellness visit, subsequent   [place] [facility] [place], E[place] T, NP   1 year ago Type 2 diabetes mellitus with proteinuria (HCC)   [place] [facility] [place], [name] T, NP   1 year ago Type 2 diabetes mellitus with proteinuria (HCC)   [place] [facility] [place], [name] T, NP   2 years ago Great toe pain, left   [place] [facility], Lauren A, NP              Passed - HCT in normal range and within 180 days    HCT  Date Value Ref Range Status  08/31/2023 44.2 39.0 - 52.0 % Final   Hematocrit  Date Value Ref Range Status  07/12/2022 38.7 37.5 - 51.0 % Final         Passed - HGB in normal range and within 180 days    Hemoglobin  Date Value Ref Range Status  08/31/2023 14.2 13.0 - 17.0 g/dL Final  29/56/2130 86.5 13.0 - 17.7 g/dL Final         Passed - PLT in normal range and within 180 days    Platelets  Date Value Ref Range Status  08/31/2023 193 150 - 400 K/uL Final  07/12/2022 265 150 - 450 x10E3/uL Final          atorvastatin (LIPITOR) 80 MG tablet [Pharmacy Med Name: ATORVASTATIN CALCIUM 80 MG TAB] 90 tablet 4    Sig: TAKE 1 TABLET BY MOUTH ONCE DAILY AT  6:00PM     Cardiovascular:  Antilipid - Statins Failed - 09/09/2023  3:36 PM      Failed - Lipid Panel in normal range within the last 12 months    Cholesterol, Total  Date Value Ref Range Status  12/30/2022 107 100 - 199 mg/dL Final   Cholesterol  Date Value Ref Range Status  12/15/2013 205 (H) 0 - 200 mg/dL Final   Cholesterol Piccolo, Waived  Date Value Ref Range Status  12/02/2018 123 <200 mg/dL Final    Comment:                            Desirable                <200                         Borderline High      200- 239                         High                     >239    Ldl Cholesterol, Calc  Date Value Ref Range Status  12/15/2013 125 (H) 0 - 100 mg/dL Final   LDL Chol Calc (NIH)  Date Value Ref  Range Status  12/30/2022 41 0 - 99 mg/dL Final   HDL Cholesterol  Date Value Ref Range Status  12/15/2013 49 40 - 60 mg/dL Final   HDL  Date Value Ref Range Status  12/30/2022 52 >39 mg/dL Final   Triglycerides  Date Value Ref Range Status  12/30/2022 62 0 - 149 mg/dL Final  09/81/1914 782 0 - 200 mg/dL Final   Triglycerides Piccolo,Waived  Date Value Ref Range Status  12/02/2018 73 <150 mg/dL Final    Comment:                            Normal                   <150                         Borderline High     150 - 199                         High                200 - 499                         Very High                >499          Passed - Patient is not pregnant      Passed - Valid encounter within last 12 months    Recent Outpatient Visits           8 months ago Type 2 diabetes mellitus with proteinuria (HCC)   [place] [facility] [place], [name] T, NP   1 year ago Medicare annual wellness visit, subsequent   [place] [facility] [place], [place] T, NP   1 year ago Type 2 diabetes mellitus with proteinuria (HCC)   [facility] [place], [name] T, NP   1 year ago Type 2 diabetes mellitus  with proteinuria (HCC)   [place] [facility] [place], [name] T, NP   2 years ago Great toe pain, left   [place] [facility] Scull, NP

## 2023-09-11 MED ORDER — OXYBUTYNIN CHLORIDE ER 10 MG PO TB24: 10.0000 mg | ORAL_TABLET | Freq: Every day | ORAL | 5 refills | Status: DC

## 2023-09-11 NOTE — Telephone Encounter (Signed)
 PA has been denied. Is there anything else we can send in for the patient?

## 2023-09-22 NOTE — Telephone Encounter (Signed)
 Called patient several times to schedule office visit. Recording call can not be completed as dialed. Created CRM if patient calls back to schedule appt.

## 2023-10-07 ENCOUNTER — Other Ambulatory Visit: Payer: Self-pay | Admitting: Nurse Practitioner

## 2023-10-09 NOTE — Telephone Encounter (Signed)
 Requested medication (s) are due for refill today:   Yes for both  Requested medication (s) are on the active medication list:   Yes for both  Future visit scheduled:   No  LOV 12/30/2022 with [name].    Was a No Show  for 04/01/2023 and 06/02/2023.    Multiple attempts made to contact pt for an appt without success.   I also tried calling her but got a recording "To try my call again later".   "This is a recording".   Last ordered: Motformin 09/09/2023 #120, 0 refills;   Lisinopril 09/09/2023 #30, 0 refills  Unable to refill because labs and office visit are due.      Requested Prescriptions  Pending Prescriptions Disp Refills   metFORMIN (GLUCOPHAGE-XR) 500 MG 24 hr tablet [Pharmacy Med Name: METFORMIN HCL ER 500 MG TAB] 120 tablet 0    Sig: TAKE 2 TABLETS BY MOUTH TWICE DAILY WITHA MEAL **NEED APPOINTMENT FOR MORE REFILLS     Endocrinology:  Diabetes - Biguanides Failed - 10/09/2023 10:42 AM      Failed - Cr in normal range and within 360 days    Creatinine  Date Value Ref Range Status  07/07/2014 1.19 0.60 - 1.30 mg/dL Final   Creatinine, Ser  Date Value Ref Range Status  08/31/2023 1.35 (H) 0.61 - 1.24 mg/dL Final         Failed - HBA1C is between 0 and 7.9 and within 180 days    Hemoglobin A1C  Date Value Ref Range Status  09/27/2015 6.5  Final   HB A1C (BAYER DCA - WAIVED)  Date Value Ref Range Status  07/12/2022 6.1 (H) 4.8 - 5.6 % Final    Comment:             Prediabetes: 5.7 - 6.4          Diabetes: >6.4          Glycemic control for adults with diabetes: <7.0    Hgb A1c MFr Bld  Date Value Ref Range Status  12/30/2022 5.5 4.8 - 5.6 % Final    Comment:             Prediabetes: 5.7 - 6.4          Diabetes: >6.4          Glycemic control for adults with diabetes: <7.0          Failed - Valid encounter within last 6 months    Recent Outpatient Visits   None            Passed - eGFR in normal range and within 360 days    EGFR (African American)  Date Value  Ref Range Status  07/07/2014 >60 >33mL/min Final  12/15/2013 >60  Final   GFR calc Af Amer  Date Value Ref Range Status  07/18/2020 107 >59 mL/min/1.73 Final    Comment:    **In accordance with recommendations from the NKF-ASN Task force,**   [facility] is in the process of updating its eGFR calculation to the   2021 CKD-EPI creatinine equation that estimates kidney function   without a race variable.    EGFR (Non-African Amer.)  Date Value Ref Range Status  07/07/2014 >60 >32mL/min Final    Comment:    eGFR values <27mL/min/1.73 m2 may be an indication of chronic kidney disease (CKD). Calculated eGFR, using the MRDR Study equation, is useful in  patients with stable renal function. The eGFR calculation will not be  reliable in acutely ill patients when serum creatinine is changing rapidly. It is not useful in patients on dialysis. The eGFR calculation may not be applicable to patients at the low and high extremes of body sizes, pregnant women, and vegetarians.   12/15/2013 >60  Final    Comment:    eGFR values <41mL/min/1.73 m2 may be an indication of chronic kidney disease (CKD). Calculated eGFR is useful in patients with stable renal function. The eGFR calculation will not be reliable in acutely ill patients when serum creatinine is changing rapidly. It is not useful in  patients on dialysis. The eGFR calculation may not be applicable to patients at the low and high extremes of body sizes, pregnant women, and vegetarians.    GFR, Estimated  Date Value Ref Range Status  08/31/2023 >60 >60 mL/min Final    Comment:    (NOTE) Calculated using the CKD-EPI Creatinine Equation (2021)    eGFR  Date Value Ref Range Status  12/30/2022 91 >59 mL/min/1.73 Final         Passed - B12 Level in normal range and within 720 days    Vitamin B-12  Date Value Ref Range Status  04/10/2022 1,132 232 - 1,245 pg/mL Final         Passed - CBC within normal limits and completed in the  last 12 months    WBC  Date Value Ref Range Status  08/31/2023 5.0 4.0 - 10.5 K/uL Final   RBC  Date Value Ref Range Status  08/31/2023 4.83 4.22 - 5.81 MIL/uL Final   Hemoglobin  Date Value Ref Range Status  08/31/2023 14.2 13.0 - 17.0 g/dL Final  40/98/1191 47.8 13.0 - 17.7 g/dL Final   HCT  Date Value Ref Range Status  08/31/2023 44.2 39.0 - 52.0 % Final   Hematocrit  Date Value Ref Range Status  07/12/2022 38.7 37.5 - 51.0 % Final   MCHC  Date Value Ref Range Status  08/31/2023 32.1 30.0 - 36.0 g/dL Final   [facility]  Date Value Ref Range Status  08/31/2023 29.4 26.0 - 34.0 pg Final   MCV  Date Value Ref Range Status  08/31/2023 91.5 80.0 - 100.0 fL Final  07/12/2022 88 79 - 97 fL Final  07/07/2014 90 80 - 100 fL Final   No results found for: "PLTCOUNTKUC", "LABPLAT", "POCPLA" RDW  Date Value Ref Range Status  08/31/2023 13.9 11.5 - 15.5 % Final  07/12/2022 13.1 11.6 - 15.4 % Final  07/07/2014 13.5 11.5 - 14.5 % Final          lisinopril (ZESTRIL) 5 MG tablet [Pharmacy Med Name: LISINOPRIL 5 MG TAB] 30 tablet 0    Sig: TAKE 1 TABLET BY MOUTH ONCE DAILY     Cardiovascular:  ACE Inhibitors Failed - 10/09/2023 10:42 AM      Failed - Cr in normal range and within 180 days    Creatinine  Date Value Ref Range Status  07/07/2014 1.19 0.60 - 1.30 mg/dL Final   Creatinine, Ser  Date Value Ref Range Status  08/31/2023 1.35 (H) 0.61 - 1.24 mg/dL Final         Failed - Last BP in normal range    BP Readings from Last 1 Encounters:  08/31/23 (!) 148/95         Failed - Valid encounter within last 6 months    Recent Outpatient Visits   None            Passed -  K in normal range and within 180 days    Potassium  Date Value Ref Range Status  08/31/2023 4.6 3.5 - 5.1 mmol/L Final    Comment:    HEMOLYSIS AT THIS LEVEL MAY AFFECT RESULT  07/07/2014 3.7 3.5 - 5.1 mmol/L Final         Passed - Patient is not pregnant

## 2023-10-10 ENCOUNTER — Other Ambulatory Visit: Payer: Self-pay | Admitting: Nurse Practitioner

## 2023-10-13 NOTE — Telephone Encounter (Signed)
 Unable to refill per protocol, courtesy refill already given,appointment needed.   Requested Prescriptions  Pending Prescriptions Disp Refills   metFORMIN (GLUCOPHAGE-XR) 500 MG 24 hr tablet [Pharmacy Med Name: METFORMIN HCL ER 500 MG TAB] 120 tablet 0    Sig: TAKE 2 TABLETS BY MOUTH TWICE DAILY WITHA MEAL **NEED APPOINTMENT FOR MORE REFILLS     Endocrinology:  Diabetes - Biguanides Failed - 10/13/2023  8:51 AM      Failed - Cr in normal range and within 360 days    Creatinine  Date Value Ref Range Status  07/07/2014 1.19 0.60 - 1.30 mg/dL Final   Creatinine, Ser  Date Value Ref Range Status  08/31/2023 1.35 (H) 0.61 - 1.24 mg/dL Final         Failed - HBA1C is between 0 and 7.9 and within 180 days    Hemoglobin A1C  Date Value Ref Range Status  09/27/2015 6.5  Final   HB A1C (BAYER DCA - WAIVED)  Date Value Ref Range Status  07/12/2022 6.1 (H) 4.8 - 5.6 % Final    Comment:             Prediabetes: 5.7 - 6.4          Diabetes: >6.4          Glycemic control for adults with diabetes: <7.0    Hgb A1c MFr Bld  Date Value Ref Range Status  12/30/2022 5.5 4.8 - 5.6 % Final    Comment:             Prediabetes: 5.7 - 6.4          Diabetes: >6.4          Glycemic control for adults with diabetes: <7.0          Failed - Valid encounter within last 6 months    Recent Outpatient Visits   None            Passed - eGFR in normal range and within 360 days    EGFR (African American)  Date Value Ref Range Status  07/07/2014 >60 >59mL/min Final  12/15/2013 >60  Final   GFR calc Af Amer  Date Value Ref Range Status  07/18/2020 107 >59 mL/min/1.73 Final    Comment:    **In accordance with recommendations from the NKF-ASN Task force,**   [facility] is in the process of updating its eGFR calculation to the   2021 CKD-EPI creatinine equation that estimates kidney function   without a race variable.    EGFR (Non-African Amer.)  Date Value Ref Range Status  07/07/2014 >60  >38mL/min Final    Comment:    eGFR values <64mL/min/1.73 m2 may be an indication of chronic kidney disease (CKD). Calculated eGFR, using the MRDR Study equation, is useful in  patients with stable renal function. The eGFR calculation will not be reliable in acutely ill patients when serum creatinine is changing rapidly. It is not useful in patients on dialysis. The eGFR calculation may not be applicable to patients at the low and high extremes of body sizes, pregnant women, and vegetarians.   12/15/2013 >60  Final    Comment:    eGFR values <35mL/min/1.73 m2 may be an indication of chronic kidney disease (CKD). Calculated eGFR is useful in patients with stable renal function. The eGFR calculation will not be reliable in acutely ill patients when serum creatinine is changing rapidly. It is not useful in  patients on dialysis. The eGFR calculation may not  be applicable to patients at the low and high extremes of body sizes, pregnant women, and vegetarians.    GFR, Estimated  Date Value Ref Range Status  08/31/2023 >60 >60 mL/min Final    Comment:    (NOTE) Calculated using the CKD-EPI Creatinine Equation (2021)    eGFR  Date Value Ref Range Status  12/30/2022 91 >59 mL/min/1.73 Final         Passed - B12 Level in normal range and within 720 days    Vitamin B-12  Date Value Ref Range Status  04/10/2022 1,132 232 - 1,245 pg/mL Final         Passed - CBC within normal limits and completed in the last 12 months    WBC  Date Value Ref Range Status  08/31/2023 5.0 4.0 - 10.5 K/uL Final   RBC  Date Value Ref Range Status  08/31/2023 4.83 4.22 - 5.81 MIL/uL Final   Hemoglobin  Date Value Ref Range Status  08/31/2023 14.2 13.0 - 17.0 g/dL Final  13/02/6577 46.9 13.0 - 17.7 g/dL Final   HCT  Date Value Ref Range Status  08/31/2023 44.2 39.0 - 52.0 % Final   Hematocrit  Date Value Ref Range Status  07/12/2022 38.7 37.5 - 51.0 % Final   MCHC  Date Value Ref Range  Status  08/31/2023 32.1 30.0 - 36.0 g/dL Final   [facility]  Date Value Ref Range Status  08/31/2023 29.4 26.0 - 34.0 pg Final   MCV  Date Value Ref Range Status  08/31/2023 91.5 80.0 - 100.0 fL Final  07/12/2022 88 79 - 97 fL Final  07/07/2014 90 80 - 100 fL Final   No results found for: "PLTCOUNTKUC", "LABPLAT", "POCPLA" RDW  Date Value Ref Range Status  08/31/2023 13.9 11.5 - 15.5 % Final  07/12/2022 13.1 11.6 - 15.4 % Final  07/07/2014 13.5 11.5 - 14.5 % Final          lisinopril (ZESTRIL) 5 MG tablet [Pharmacy Med Name: LISINOPRIL 5 MG TAB] 30 tablet 0    Sig: TAKE 1 TABLET BY MOUTH ONCE DAILY     Cardiovascular:  ACE Inhibitors Failed - 10/13/2023  8:51 AM      Failed - Cr in normal range and within 180 days    Creatinine  Date Value Ref Range Status  07/07/2014 1.19 0.60 - 1.30 mg/dL Final   Creatinine, Ser  Date Value Ref Range Status  08/31/2023 1.35 (H) 0.61 - 1.24 mg/dL Final         Failed - Last BP in normal range    BP Readings from Last 1 Encounters:  08/31/23 (!) 148/95         Failed - Valid encounter within last 6 months    Recent Outpatient Visits   None            Passed - K in normal range and within 180 days    Potassium  Date Value Ref Range Status  08/31/2023 4.6 3.5 - 5.1 mmol/L Final    Comment:    HEMOLYSIS AT THIS LEVEL MAY AFFECT RESULT  07/07/2014 3.7 3.5 - 5.1 mmol/L Final         Passed - Patient is not pregnant       carvedilol (COREG) 6.25 MG tablet [Pharmacy Med Name: CARVEDILOL 6.25 MG TAB] 60 tablet 0    Sig: TAKE 1 TABLET BY MOUTH TWICE DAILY WITH A MEAL     Cardiovascular: Beta Blockers 3 Failed -  10/13/2023  8:51 AM      Failed - Cr in normal range and within 360 days    Creatinine  Date Value Ref Range Status  07/07/2014 1.19 0.60 - 1.30 mg/dL Final   Creatinine, Ser  Date Value Ref Range Status  08/31/2023 1.35 (H) 0.61 - 1.24 mg/dL Final         Failed - Last BP in normal range    BP Readings from Last 1  Encounters:  08/31/23 (!) 148/95         Failed - Valid encounter within last 6 months    Recent Outpatient Visits   None            Passed - AST in normal range and within 360 days    AST  Date Value Ref Range Status  08/31/2023 19 15 - 41 U/L Final    Comment:    HEMOLYSIS AT THIS LEVEL MAY AFFECT RESULT   SGOT(AST)  Date Value Ref Range Status  07/07/2014 10 (L) 15 - 37 Unit/L Final         Passed - ALT in normal range and within 360 days    ALT  Date Value Ref Range Status  08/31/2023 19 0 - 44 U/L Final    Comment:    HEMOLYSIS AT THIS LEVEL MAY AFFECT RESULT   SGPT (ALT)  Date Value Ref Range Status  07/07/2014 15 U/L Final    Comment:    14-63 NOTE: New Reference Range 01/25/14          Passed - Last Heart Rate in normal range    Pulse Readings from Last 1 Encounters:  08/31/23 95          amLODipine (NORVASC) 10 MG tablet [Pharmacy Med Name: AMLODIPINE BESYLATE 10 MG TAB] 30 tablet 0    Sig: TAKE 1 TABLET BY MOUTH ONCE DAILY     Cardiovascular: Calcium Channel Blockers 2 Failed - 10/13/2023  8:51 AM      Failed - Last BP in normal range    BP Readings from Last 1 Encounters:  08/31/23 (!) 148/95         Failed - Valid encounter within last 6 months    Recent Outpatient Visits   None            Passed - Last Heart Rate in normal range    Pulse Readings from Last 1 Encounters:  08/31/23 95

## 2023-10-22 ENCOUNTER — Other Ambulatory Visit: Payer: Self-pay | Admitting: Nurse Practitioner

## 2023-10-22 NOTE — Telephone Encounter (Signed)
 Copied from CRM [id number]. Topic: Clinical - Medication Refill >> Oct 22, 2023 11:39 AM Lizabeth Riggs wrote: Most Recent Primary Care Visit:  Provider: CANNADY, [name] T  Department: [id number]  Visit Type: OFFICE VISIT  Date: 12/30/2022  Medication: metFORMIN (GLUCOPHAGE-XR) 500 MG 24 hr tablet AND lisinopril (ZESTRIL) 5 MG tablet AND carvedilol (COREG) 6.25 MG tablet AND amLODipine (NORVASC) 10 MG tablet  Has the patient contacted their pharmacy? Yes (Agent: If no, request that the patient contact the pharmacy for the refill. If patient does not wish to contact the pharmacy document the reason why and proceed with request.) (Agent: If yes, when and what did the pharmacy advise?) Pharmacy needs an order to refill the 4 medications   Is this the correct pharmacy for this prescription? Yes If no, delete pharmacy and type the correct one.  This is the patient's preferred pharmacy:  [facility] - [place], [place] - [address] [place] [address] Phone: [phone] Fax: [phone]  Has the prescription been filled recently? No  Is the patient out of the medication? Yes - He is out of all 4 medications   Has the patient been seen for an appointment in the last year OR does the patient have an upcoming appointment? Yes  Can we respond through MyChart? No  Agent: Please be advised that Rx refills may take up to 3 business days. We ask that you follow-up with your pharmacy.

## 2023-10-23 NOTE — Telephone Encounter (Signed)
 Unable to refill per protocol, courtesy refill already given, OV needed before additional refills can be dispensed.  Requested Prescriptions  Pending Prescriptions Disp Refills   amLODipine (NORVASC) 10 MG tablet 30 tablet 0    Sig: Take 1 tablet (10 mg total) by mouth daily.     Cardiovascular: Calcium Channel Blockers 2 Failed - 10/23/2023 12:13 PM      Failed - Last BP in normal range    BP Readings from Last 1 Encounters:  08/31/23 (!) 148/95         Failed - Valid encounter within last 6 months    Recent Outpatient Visits   None            Passed - Last Heart Rate in normal range    Pulse Readings from Last 1 Encounters:  08/31/23 95          metFORMIN (GLUCOPHAGE-XR) 500 MG 24 hr tablet 120 tablet 0     Endocrinology:  Diabetes - Biguanides Failed - 10/23/2023 12:13 PM      Failed - Cr in normal range and within 360 days    Creatinine  Date Value Ref Range Status  07/07/2014 1.19 0.60 - 1.30 mg/dL Final   Creatinine, Ser  Date Value Ref Range Status  08/31/2023 1.35 (H) 0.61 - 1.24 mg/dL Final         Failed - HBA1C is between 0 and 7.9 and within 180 days    Hemoglobin A1C  Date Value Ref Range Status  09/27/2015 6.5  Final   HB A1C (BAYER DCA - WAIVED)  Date Value Ref Range Status  07/12/2022 6.1 (H) 4.8 - 5.6 % Final    Comment:             Prediabetes: 5.7 - 6.4          Diabetes: >6.4          Glycemic control for adults with diabetes: <7.0    Hgb A1c MFr Bld  Date Value Ref Range Status  12/30/2022 5.5 4.8 - 5.6 % Final    Comment:             Prediabetes: 5.7 - 6.4          Diabetes: >6.4          Glycemic control for adults with diabetes: <7.0          Failed - Valid encounter within last 6 months    Recent Outpatient Visits   None            Passed - eGFR in normal range and within 360 days    EGFR (African American)  Date Value Ref Range Status  07/07/2014 >60 >78mL/min Final  12/15/2013 >60  Final   GFR calc Af Amer  Date  Value Ref Range Status  07/18/2020 107 >59 mL/min/1.73 Final    Comment:    **In accordance with recommendations from the NKF-ASN Task force,**   [facility] is in the process of updating its eGFR calculation to the   2021 CKD-EPI creatinine equation that estimates kidney function   without a race variable.    EGFR (Non-African Amer.)  Date Value Ref Range Status  07/07/2014 >60 >13mL/min Final    Comment:    eGFR values <49mL/min/1.73 m2 may be an indication of chronic kidney disease (CKD). Calculated eGFR, using the MRDR Study equation, is useful in  patients with stable renal function. The eGFR calculation will not be reliable in acutely ill patients  when serum creatinine is changing rapidly. It is not useful in patients on dialysis. The eGFR calculation may not be applicable to patients at the low and high extremes of body sizes, pregnant women, and vegetarians.   12/15/2013 >60  Final    Comment:    eGFR values <41mL/min/1.73 m2 may be an indication of chronic kidney disease (CKD). Calculated eGFR is useful in patients with stable renal function. The eGFR calculation will not be reliable in acutely ill patients when serum creatinine is changing rapidly. It is not useful in  patients on dialysis. The eGFR calculation may not be applicable to patients at the low and high extremes of body sizes, pregnant women, and vegetarians.    GFR, Estimated  Date Value Ref Range Status  08/31/2023 >60 >60 mL/min Final    Comment:    (NOTE) Calculated using the CKD-EPI Creatinine Equation (2021)    eGFR  Date Value Ref Range Status  12/30/2022 91 >59 mL/min/1.73 Final         Passed - B12 Level in normal range and within 720 days    Vitamin B-12  Date Value Ref Range Status  04/10/2022 1,132 232 - 1,245 pg/mL Final         Passed - CBC within normal limits and completed in the last 12 months    WBC  Date Value Ref Range Status  08/31/2023 5.0 4.0 - 10.5 K/uL Final   RBC   Date Value Ref Range Status  08/31/2023 4.83 4.22 - 5.81 MIL/uL Final   Hemoglobin  Date Value Ref Range Status  08/31/2023 14.2 13.0 - 17.0 g/dL Final  16/04/9603 54.0 13.0 - 17.7 g/dL Final   HCT  Date Value Ref Range Status  08/31/2023 44.2 39.0 - 52.0 % Final   Hematocrit  Date Value Ref Range Status  07/12/2022 38.7 37.5 - 51.0 % Final   MCHC  Date Value Ref Range Status  08/31/2023 32.1 30.0 - 36.0 g/dL Final   [facility]  Date Value Ref Range Status  08/31/2023 29.4 26.0 - 34.0 pg Final   MCV  Date Value Ref Range Status  08/31/2023 91.5 80.0 - 100.0 fL Final  07/12/2022 88 79 - 97 fL Final  07/07/2014 90 80 - 100 fL Final   No results found for: "PLTCOUNTKUC", "LABPLAT", "POCPLA" RDW  Date Value Ref Range Status  08/31/2023 13.9 11.5 - 15.5 % Final  07/12/2022 13.1 11.6 - 15.4 % Final  07/07/2014 13.5 11.5 - 14.5 % Final          lisinopril (ZESTRIL) 5 MG tablet 30 tablet 0    Sig: Take 1 tablet (5 mg total) by mouth daily.     Cardiovascular:  ACE Inhibitors Failed - 10/23/2023 12:13 PM      Failed - Cr in normal range and within 180 days    Creatinine  Date Value Ref Range Status  07/07/2014 1.19 0.60 - 1.30 mg/dL Final   Creatinine, Ser  Date Value Ref Range Status  08/31/2023 1.35 (H) 0.61 - 1.24 mg/dL Final         Failed - Last BP in normal range    BP Readings from Last 1 Encounters:  08/31/23 (!) 148/95         Failed - Valid encounter within last 6 months    Recent Outpatient Visits   None            Passed - K in normal range and within 180 days  Potassium  Date Value Ref Range Status  08/31/2023 4.6 3.5 - 5.1 mmol/L Final    Comment:    HEMOLYSIS AT THIS LEVEL MAY AFFECT RESULT  07/07/2014 3.7 3.5 - 5.1 mmol/L Final         Passed - Patient is not pregnant       carvedilol (COREG) 6.25 MG tablet 60 tablet 0    Sig: Take 1 tablet (6.25 mg total) by mouth 2 (two) times daily with a meal.     Cardiovascular: Beta Blockers  3 Failed - 10/23/2023 12:13 PM      Failed - Cr in normal range and within 360 days    Creatinine  Date Value Ref Range Status  07/07/2014 1.19 0.60 - 1.30 mg/dL Final   Creatinine, Ser  Date Value Ref Range Status  08/31/2023 1.35 (H) 0.61 - 1.24 mg/dL Final         Failed - Last BP in normal range    BP Readings from Last 1 Encounters:  08/31/23 (!) 148/95         Failed - Valid encounter within last 6 months    Recent Outpatient Visits   None            Passed - AST in normal range and within 360 days    AST  Date Value Ref Range Status  08/31/2023 19 15 - 41 U/L Final    Comment:    HEMOLYSIS AT THIS LEVEL MAY AFFECT RESULT   SGOT(AST)  Date Value Ref Range Status  07/07/2014 10 (L) 15 - 37 Unit/L Final         Passed - ALT in normal range and within 360 days    ALT  Date Value Ref Range Status  08/31/2023 19 0 - 44 U/L Final    Comment:    HEMOLYSIS AT THIS LEVEL MAY AFFECT RESULT   SGPT (ALT)  Date Value Ref Range Status  07/07/2014 15 U/L Final    Comment:    14-63 NOTE: New Reference Range 01/25/14          Passed - Last Heart Rate in normal range    Pulse Readings from Last 1 Encounters:  08/31/23 95

## 2023-10-24 ENCOUNTER — Other Ambulatory Visit: Payer: Self-pay | Admitting: Nurse Practitioner

## 2023-10-24 NOTE — Telephone Encounter (Signed)
 Requested medications are due for refill today.  yes  Requested medications are on the active medications list.  yes  Last refill. 09/09/2023 - 30 day supply for all  Future visit scheduled.   Yes 10/27/2023  Notes to clinic.  Per protocol, I would only be able to fill these rxs to "get" him to his next appt. Which is Monday.    Requested Prescriptions  Pending Prescriptions Disp Refills   metFORMIN  (GLUCOPHAGE -XR) 500 MG 24 hr tablet [Pharmacy Med Name: METFORMIN  HCL ER 500 MG TAB] 120 tablet 0    Sig: TAKE 2 TABLETS BY MOUTH TWICE DAILY WITHA MEAL **NEED APPOINTMENT FOR MORE REFILLS     Endocrinology:  Diabetes - Biguanides Failed - 10/24/2023  5:09 PM      Failed - Cr in normal range and within 360 days    Creatinine  Date Value Ref Range Status  07/07/2014 1.19 0.60 - 1.30 mg/dL Final   Creatinine, Ser  Date Value Ref Range Status  08/31/2023 1.35 (H) 0.61 - 1.24 mg/dL Final         Failed - HBA1C is between 0 and 7.9 and within 180 days    Hemoglobin A1C  Date Value Ref Range Status  09/27/2015 6.5  Final   HB A1C (BAYER DCA - WAIVED)  Date Value Ref Range Status  07/12/2022 6.1 (H) 4.8 - 5.6 % Final    Comment:             Prediabetes: 5.7 - 6.4          Diabetes: >6.4          Glycemic control for adults with diabetes: <7.0    Hgb A1c MFr Bld  Date Value Ref Range Status  12/30/2022 5.5 4.8 - 5.6 % Final    Comment:             Prediabetes: 5.7 - 6.4          Diabetes: >6.4          Glycemic control for adults with diabetes: <7.0          Failed - Valid encounter within last 6 months    Recent Outpatient Visits   None            Passed - eGFR in normal range and within 360 days    EGFR (African American)  Date Value Ref Range Status  07/07/2014 >60 >89mL/min Final  12/15/2013 >60  Final   GFR calc Af Amer  Date Value Ref Range Status  07/18/2020 107 >59 mL/min/1.73 Final    Comment:    **In accordance with recommendations from the NKF-ASN Task  force,**   [facility] is in the process of updating its eGFR calculation to the   2021 CKD-EPI creatinine equation that estimates kidney function   without a race variable.    EGFR (Non-African Amer.)  Date Value Ref Range Status  07/07/2014 >60 >23mL/min Final    Comment:    eGFR values <31mL/min/1.73 m2 may be an indication of chronic kidney disease (CKD). Calculated eGFR, using the MRDR Study equation, is useful in  patients with stable renal function. The eGFR calculation will not be reliable in acutely ill patients when serum creatinine is changing rapidly. It is not useful in patients on dialysis. The eGFR calculation may not be applicable to patients at the low and high extremes of body sizes, pregnant women, and vegetarians.   12/15/2013 >60  Final    Comment:  eGFR values <40mL/min/1.73 m2 may be an indication of chronic kidney disease (CKD). Calculated eGFR is useful in patients with stable renal function. The eGFR calculation will not be reliable in acutely ill patients when serum creatinine is changing rapidly. It is not useful in  patients on dialysis. The eGFR calculation may not be applicable to patients at the low and high extremes of body sizes, pregnant women, and vegetarians.    GFR, Estimated  Date Value Ref Range Status  08/31/2023 >60 >60 mL/min Final    Comment:    (NOTE) Calculated using the CKD-EPI Creatinine Equation (2021)    eGFR  Date Value Ref Range Status  12/30/2022 91 >59 mL/min/1.73 Final         Passed - B12 Level in normal range and within 720 days    Vitamin B-12  Date Value Ref Range Status  04/10/2022 1,132 232 - 1,245 pg/mL Final         Passed - CBC within normal limits and completed in the last 12 months    WBC  Date Value Ref Range Status  08/31/2023 5.0 4.0 - 10.5 K/uL Final   RBC  Date Value Ref Range Status  08/31/2023 4.83 4.22 - 5.81 MIL/uL Final   Hemoglobin  Date Value Ref Range Status  08/31/2023 14.2 13.0  - 17.0 g/dL Final  16/04/9603 54.0 13.0 - 17.7 g/dL Final   HCT  Date Value Ref Range Status  08/31/2023 44.2 39.0 - 52.0 % Final   Hematocrit  Date Value Ref Range Status  07/12/2022 38.7 37.5 - 51.0 % Final   MCHC  Date Value Ref Range Status  08/31/2023 32.1 30.0 - 36.0 g/dL Final   [facility]  Date Value Ref Range Status  08/31/2023 29.4 26.0 - 34.0 pg Final   MCV  Date Value Ref Range Status  08/31/2023 91.5 80.0 - 100.0 fL Final  07/12/2022 88 79 - 97 fL Final  07/07/2014 90 80 - 100 fL Final   No results found for: "PLTCOUNTKUC", "LABPLAT", "POCPLA" RDW  Date Value Ref Range Status  08/31/2023 13.9 11.5 - 15.5 % Final  07/12/2022 13.1 11.6 - 15.4 % Final  07/07/2014 13.5 11.5 - 14.5 % Final          lisinopril  (ZESTRIL ) 5 MG tablet [Pharmacy Med Name: LISINOPRIL  5 MG TAB] 30 tablet 0    Sig: TAKE 1 TABLET BY MOUTH ONCE DAILY     Cardiovascular:  ACE Inhibitors Failed - 10/24/2023  5:09 PM      Failed - Cr in normal range and within 180 days    Creatinine  Date Value Ref Range Status  07/07/2014 1.19 0.60 - 1.30 mg/dL Final   Creatinine, Ser  Date Value Ref Range Status  08/31/2023 1.35 (H) 0.61 - 1.24 mg/dL Final         Failed - Last BP in normal range    BP Readings from Last 1 Encounters:  08/31/23 (!) 148/95         Failed - Valid encounter within last 6 months    Recent Outpatient Visits   None            Passed - K in normal range and within 180 days    Potassium  Date Value Ref Range Status  08/31/2023 4.6 3.5 - 5.1 mmol/L Final    Comment:    HEMOLYSIS AT THIS LEVEL MAY AFFECT RESULT  07/07/2014 3.7 3.5 - 5.1 mmol/L Final  Passed - Patient is not pregnant       carvedilol  (COREG ) 6.25 MG tablet [Pharmacy Med Name: CARVEDILOL  6.25 MG TAB] 60 tablet 0    Sig: TAKE 1 TABLET BY MOUTH TWICE DAILY WITH A MEAL     Cardiovascular: Beta Blockers 3 Failed - 10/24/2023  5:09 PM      Failed - Cr in normal range and within 360 days     Creatinine  Date Value Ref Range Status  07/07/2014 1.19 0.60 - 1.30 mg/dL Final   Creatinine, Ser  Date Value Ref Range Status  08/31/2023 1.35 (H) 0.61 - 1.24 mg/dL Final         Failed - Last BP in normal range    BP Readings from Last 1 Encounters:  08/31/23 (!) 148/95         Failed - Valid encounter within last 6 months    Recent Outpatient Visits   None            Passed - AST in normal range and within 360 days    AST  Date Value Ref Range Status  08/31/2023 19 15 - 41 U/L Final    Comment:    HEMOLYSIS AT THIS LEVEL MAY AFFECT RESULT   SGOT(AST)  Date Value Ref Range Status  07/07/2014 10 (L) 15 - 37 Unit/L Final         Passed - ALT in normal range and within 360 days    ALT  Date Value Ref Range Status  08/31/2023 19 0 - 44 U/L Final    Comment:    HEMOLYSIS AT THIS LEVEL MAY AFFECT RESULT   SGPT (ALT)  Date Value Ref Range Status  07/07/2014 15 U/L Final    Comment:    14-63 NOTE: New Reference Range 01/25/14          Passed - Last Heart Rate in normal range    Pulse Readings from Last 1 Encounters:  08/31/23 95          amLODipine  (NORVASC ) 10 MG tablet [Pharmacy Med Name: AMLODIPINE  BESYLATE 10 MG TAB] 30 tablet 0    Sig: TAKE 1 TABLET BY MOUTH ONCE DAILY     Cardiovascular: Calcium  Channel Blockers 2 Failed - 10/24/2023  5:09 PM      Failed - Last BP in normal range    BP Readings from Last 1 Encounters:  08/31/23 (!) 148/95         Failed - Valid encounter within last 6 months    Recent Outpatient Visits   None            Passed - Last Heart Rate in normal range    Pulse Readings from Last 1 Encounters:  08/31/23 95

## 2023-10-25 DIAGNOSIS — E119 Type 2 diabetes mellitus without complications: Secondary | ICD-10-CM | POA: Insufficient documentation

## 2023-10-25 NOTE — Patient Instructions (Signed)

## 2023-10-27 ENCOUNTER — Encounter: Payer: Self-pay | Admitting: Nurse Practitioner

## 2023-10-27 ENCOUNTER — Ambulatory Visit: Admitting: Nurse Practitioner

## 2023-10-27 VITALS — BP 130/82 | HR 69 | Temp 97.5°F | Ht 73.5 in | Wt 232.2 lb

## 2023-10-27 DIAGNOSIS — Z8673 Personal history of transient ischemic attack (TIA), and cerebral infarction without residual deficits: Secondary | ICD-10-CM

## 2023-10-27 DIAGNOSIS — N400 Enlarged prostate without lower urinary tract symptoms: Secondary | ICD-10-CM

## 2023-10-27 DIAGNOSIS — Z7409 Other reduced mobility: Secondary | ICD-10-CM

## 2023-10-27 DIAGNOSIS — E1129 Type 2 diabetes mellitus with other diabetic kidney complication: Secondary | ICD-10-CM

## 2023-10-27 DIAGNOSIS — E1142 Type 2 diabetes mellitus with diabetic polyneuropathy: Secondary | ICD-10-CM | POA: Diagnosis not present

## 2023-10-27 DIAGNOSIS — R300 Dysuria: Secondary | ICD-10-CM

## 2023-10-27 DIAGNOSIS — R809 Proteinuria, unspecified: Secondary | ICD-10-CM

## 2023-10-27 DIAGNOSIS — E66812 Obesity, class 2: Secondary | ICD-10-CM

## 2023-10-27 DIAGNOSIS — E1159 Type 2 diabetes mellitus with other circulatory complications: Secondary | ICD-10-CM

## 2023-10-27 DIAGNOSIS — E119 Type 2 diabetes mellitus without complications: Secondary | ICD-10-CM | POA: Diagnosis not present

## 2023-10-27 DIAGNOSIS — N3941 Urge incontinence: Secondary | ICD-10-CM

## 2023-10-27 DIAGNOSIS — I152 Hypertension secondary to endocrine disorders: Secondary | ICD-10-CM

## 2023-10-27 DIAGNOSIS — I429 Cardiomyopathy, unspecified: Secondary | ICD-10-CM

## 2023-10-27 DIAGNOSIS — Z7984 Long term (current) use of oral hypoglycemic drugs: Secondary | ICD-10-CM

## 2023-10-27 DIAGNOSIS — E538 Deficiency of other specified B group vitamins: Secondary | ICD-10-CM

## 2023-10-27 DIAGNOSIS — E1169 Type 2 diabetes mellitus with other specified complication: Secondary | ICD-10-CM

## 2023-10-27 DIAGNOSIS — G910 Communicating hydrocephalus: Secondary | ICD-10-CM

## 2023-10-27 LAB — BAYER DCA HB A1C WAIVED: HB A1C (BAYER DCA - WAIVED): 6 % — ABNORMAL HIGH (ref 4.8–5.6)

## 2023-10-27 MED ORDER — ACETAMINOPHEN 325 MG PO TABS: 650.0000 mg | ORAL_TABLET | Freq: Four times a day (QID) | ORAL | 0 refills | Status: DC | PRN

## 2023-10-27 MED ORDER — CARVEDILOL 6.25 MG PO TABS: 6.2500 mg | ORAL_TABLET | Freq: Two times a day (BID) | ORAL | 1 refills | Status: DC

## 2023-10-27 MED ORDER — CITALOPRAM HYDROBROMIDE 10 MG PO TABS: 10.0000 mg | ORAL_TABLET | Freq: Every day | ORAL | 1 refills | Status: DC

## 2023-10-27 MED ORDER — METFORMIN HCL ER 500 MG PO TB24: 500.0000 mg | ORAL_TABLET | Freq: Two times a day (BID) | ORAL | 1 refills | Status: DC

## 2023-10-27 MED ORDER — CLOPIDOGREL BISULFATE 75 MG PO TABS: ORAL_TABLET | ORAL | 1 refills | Status: DC

## 2023-10-27 MED ORDER — OXYBUTYNIN CHLORIDE ER 10 MG PO TB24: 10.0000 mg | ORAL_TABLET | Freq: Every day | ORAL | 1 refills | Status: DC

## 2023-10-27 MED ORDER — ATORVASTATIN CALCIUM 80 MG PO TABS: ORAL_TABLET | ORAL | 1 refills | Status: DC

## 2023-10-27 MED ORDER — ASPIRIN 81 MG PO TBEC: 81.0000 mg | DELAYED_RELEASE_TABLET | Freq: Every day | ORAL | 1 refills | Status: DC

## 2023-10-27 MED ORDER — TAMSULOSIN HCL 0.4 MG PO CAPS: 0.4000 mg | ORAL_CAPSULE | Freq: Every day | ORAL | 1 refills | Status: DC

## 2023-10-27 MED ORDER — AMLODIPINE BESYLATE 10 MG PO TABS: 10.0000 mg | ORAL_TABLET | Freq: Every day | ORAL | 1 refills | Status: DC

## 2023-10-27 MED ORDER — VITAMIN B-12 1000 MCG PO TABS: 1000.0000 ug | ORAL_TABLET | Freq: Every day | ORAL | 1 refills | Status: DC

## 2023-10-27 MED ORDER — DONEPEZIL HCL 5 MG PO TABS: 5.0000 mg | ORAL_TABLET | Freq: Every day | ORAL | 1 refills | Status: DC

## 2023-10-27 MED ORDER — LISINOPRIL 5 MG PO TABS: 5.0000 mg | ORAL_TABLET | Freq: Every day | ORAL | 1 refills | Status: DC

## 2023-10-27 MED ORDER — ACCU-CHEK SOFTCLIX LANCETS MISC: 12 refills | Status: AC

## 2023-10-27 MED ORDER — CONTOUR NEXT MONITOR W/DEVICE KIT: PACK | 0 refills | Status: AC

## 2023-10-27 MED ORDER — ACCU-CHEK GUIDE TEST VI STRP: ORAL_STRIP | 12 refills | Status: AC

## 2023-10-27 MED ORDER — GABAPENTIN 300 MG PO CAPS: 300.0000 mg | ORAL_CAPSULE | Freq: Three times a day (TID) | ORAL | 1 refills | Status: DC

## 2023-10-27 NOTE — Assessment & Plan Note (Signed)
 Chronic, ongoing. A1c 6.0% today. Unable to obtain urine sample today. Continue Metformin  XR 500 mg daily. Report having maybe 1 to 2 hypoglycemic episodes. Unable to check BS at home due to not having supplies. Refills sent to pharmacy. Recommend he continue to focus on diet and alcohol reduction. Recommend he check BS at home and document for provider. Return in 6 months.

## 2023-10-27 NOTE — Assessment & Plan Note (Signed)
 Chronic, ongoing. Continue current medication regimen Atorvastatin  80 mg. Will adjust as needed. Lipid panel today. Refills sent to pharmacy. Return in 6 months.

## 2023-10-27 NOTE — Assessment & Plan Note (Signed)
 Chronic, ongoing. Recommend to continue use of Rolator and W/C as needed. Return in 6 months.

## 2023-10-27 NOTE — Progress Notes (Deleted)
 There were no vitals taken for this visit.   Subjective:    Patient ID: [name], male    DOB: Aug 22, 1970, 53 y.o.   MRN: [id number]  HPI: [name] is a 53 y.o. male  Chief Complaint  Patient presents with   Hypertension   Hyperlipidemia   Diabetes   Guardian at bedside to assist with HPI.  DIABETES Taking Metformin  XR 1000 MG BID. A1c 5.5% June 2024, has been lost to follow-up since then with multiple missed appointments. Current alcohol use and reports   History of B12 deficiency, recent levels improved with supplement.    Hypoglycemic episodes:{Blank single:19197::"yes","no"} Polydipsia/polyuria: {Blank single:19197::"yes","no"} Visual disturbance: {Blank single:19197::"yes","no"} Chest pain: {Blank single:19197::"yes","no"} Paresthesias: {Blank single:19197::"yes","no"} Glucose Monitoring: {Blank single:19197::"yes","no"}  Accucheck frequency: {Blank single:19197::"Not Checking","Daily","BID","TID"}  Fasting glucose:  Post prandial:  Evening:  Before meals: Taking Insulin ?: {Blank single:19197::"yes","no"}  Long acting insulin :  Short acting insulin : Blood Pressure Monitoring: {Blank single:19197::"not checking","rarely","daily","weekly","monthly","a few times a day","a few times a week","a few times a month"} Retinal Examination: {Blank single:19197::"Up to Date","Not up to Date"} Foot Exam: {Blank single:19197::"Up to Date","Not up to Date"} Diabetic Education: {Blank single:19197::"Completed","Not Completed"} Pneumovax: {Blank single:19197::"Up to Date","Not up to Date","unknown"} Influenza: {Blank single:19197::"Up to Date","Not up to Date","unknown"} Aspirin : {Blank single:19197::"yes","no"}   HYPERTENSION / HYPERLIPIDEMIA Taking Amlodipine  10 MG, ASA, Plavix , Carvedilol  6.25 MG BID, Lisinopril  5 MG QDAY, and Lipitor  80 MG daily.  Has not seen cardiology since 11/06/21. Cardiomyopathy on diagnosis list from past history.   Has history of CVA, continues  on Aricept  for memory changes.  Has underlying chronic communicating hydrocephalus.  Has not seen neurology since 06/20/20.  Has underlying issues with mobility due to past CVA. Satisfied with current treatment? {Blank single:19197::"yes","no"} Duration of hypertension: {Blank single:19197::"chronic","months","years"} BP monitoring frequency: {Blank single:19197::"not checking","rarely","daily","weekly","monthly","a few times a day","a few times a week","a few times a month"} BP range:  BP medication side effects: {Blank single:19197::"yes","no"} Duration of hyperlipidemia: {Blank single:19197::"chronic","months","years"} Cholesterol medication side effects: {Blank single:19197::"yes","no"} Cholesterol supplements: {Blank multiple:19196::"none","fish oil","niacin","red yeast rice"} Medication compliance: {Blank single:19197::"excellent compliance","good compliance","fair compliance","poor compliance"} Aspirin : {Blank single:19197::"yes","no"} Recent stressors: {Blank single:19197::"yes","no"} Recurrent headaches: {Blank single:19197::"yes","no"} Visual changes: {Blank single:19197::"yes","no"} Palpitations: {Blank single:19197::"yes","no"} Dyspnea: {Blank single:19197::"yes","no"} Chest pain: {Blank single:19197::"yes","no"} Lower extremity edema: {Blank single:19197::"yes","no"} Dizzy/lightheaded: {Blank single:19197::"yes","no"}   URINARY INCONTINENCE Continues on Flomax  daily. Last visit he reported urinary incontinence was improving.  Reports some dysuria today. Duration: {Blank single:19197::"chronic","months","years"} Nocturia: {Blank single:19197::"no","1/night","1-2x per night","2-3x per night","3-4x per night","4-5x per night","5+ times per night"} Urinary frequency:{Blank single:19197::"yes","no"} Incomplete voiding: {Blank single:19197::"yes","no"} Urgency: {Blank single:19197::"yes","no"} Weak urinary stream: {Blank single:19197::"yes","no"} Straining to start stream: {Blank  single:19197::"yes","no"} Dysuria: {Blank single:19197::"yes","no"}  Relevant past medical, surgical, family and social history reviewed and updated as indicated. Interim medical history since our last visit reviewed. Allergies and medications reviewed and updated.  Review of Systems  Per HPI unless specifically indicated above     Objective:    There were no vitals taken for this visit.  Wt Readings from Last 3 Encounters:  07/26/23 250 lb (113.4 kg)  12/30/22 253 lb (114.8 kg)  07/12/22 269 lb (122 kg)    Physical Exam  Results for orders placed or performed during the hospital encounter of 08/31/23  Resp panel by RT-PCR (RSV, Flu A&B, Covid) Anterior Nasal Swab   Collection Time: 08/31/23  9:41 AM   Specimen: Anterior Nasal Swab  Result Value Ref Range   SARS Coronavirus 2 by RT PCR NEGATIVE NEGATIVE   Influenza A by PCR NEGATIVE NEGATIVE  Influenza B by PCR NEGATIVE NEGATIVE   Resp Syncytial Virus by PCR NEGATIVE NEGATIVE  CBC with Differential   Collection Time: 08/31/23  9:41 AM  Result Value Ref Range   WBC 5.0 4.0 - 10.5 K/uL   RBC 4.83 4.22 - 5.81 MIL/uL   Hemoglobin 14.2 13.0 - 17.0 g/dL   HCT 16.1 09.6 - 04.5 %   MCV 91.5 80.0 - 100.0 fL   MCH 29.4 26.0 - 34.0 pg   MCHC 32.1 30.0 - 36.0 g/dL   RDW 40.9 81.1 - 91.4 %   Platelets 193 150 - 400 K/uL   nRBC 0.0 0.0 - 0.2 %   Neutrophils Relative % 77 %   Neutro Abs 3.8 1.7 - 7.7 K/uL   Lymphocytes Relative 9 %   Lymphs Abs 0.4 (L) 0.7 - 4.0 K/uL   Monocytes Relative 13 %   Monocytes Absolute 0.6 0.1 - 1.0 K/uL   Eosinophils Relative 1 %   Eosinophils Absolute 0.1 0.0 - 0.5 K/uL   Basophils Relative 0 %   Basophils Absolute 0.0 0.0 - 0.1 K/uL   Immature Granulocytes 0 %   Abs Immature Granulocytes 0.02 0.00 - 0.07 K/uL  Comprehensive metabolic panel   Collection Time: 08/31/23  9:41 AM  Result Value Ref Range   Sodium 137 135 - 145 mmol/L   Potassium 4.6 3.5 - 5.1 mmol/L   Chloride 100 98 - 111  mmol/L   CO2 25 22 - 32 mmol/L   Glucose, Bld 102 (H) 70 - 99 mg/dL   BUN 31 (H) 6 - 20 mg/dL   Creatinine, Ser 7.82 (H) 0.61 - 1.24 mg/dL   Calcium  8.9 8.9 - 10.3 mg/dL   Total Protein 7.8 6.5 - 8.1 g/dL   Albumin 3.9 3.5 - 5.0 g/dL   AST 19 15 - 41 U/L   ALT 19 0 - 44 U/L   Alkaline Phosphatase 55 38 - 126 U/L   Total Bilirubin 1.0 0.0 - 1.2 mg/dL   GFR, Estimated >95 >62 mL/min   Anion gap 12 5 - 15  Troponin I (High Sensitivity)   Collection Time: 08/31/23  9:41 AM  Result Value Ref Range   Troponin I (High Sensitivity) <2 <18 ng/L  Troponin I (High Sensitivity)   Collection Time: 08/31/23 11:10 AM  Result Value Ref Range   Troponin I (High Sensitivity) <2 <18 ng/L  Urinalysis, Routine w reflex microscopic -Urine, Clean Catch   Collection Time: 08/31/23 12:32 PM  Result Value Ref Range   Color, Urine STRAW (A) YELLOW   APPearance CLEAR (A) CLEAR   Specific Gravity, Urine 1.014 1.005 - 1.030   pH 5.0 5.0 - 8.0   Glucose, UA NEGATIVE NEGATIVE mg/dL   Hgb urine dipstick SMALL (A) NEGATIVE   Bilirubin Urine NEGATIVE NEGATIVE   Ketones, ur NEGATIVE NEGATIVE mg/dL   Protein, ur NEGATIVE NEGATIVE mg/dL   Nitrite NEGATIVE NEGATIVE   Leukocytes,Ua NEGATIVE NEGATIVE   RBC / HPF 0-5 0 - 5 RBC/hpf   WBC, UA 6-10 0 - 5 WBC/hpf   Bacteria, UA RARE (A) NONE SEEN   Squamous Epithelial / HPF 0 0 - 5 /HPF      Assessment & Plan:   Problem List Items Addressed This Visit       Cardiovascular and Mediastinum   Hypertension associated with diabetes (HCC)   Relevant Orders   Bayer DCA Hb A1c Waived   Microalbumin, Urine Waived   CBC with Differential/Platelet   TSH  Cardiomyopathy [facility])     Endocrine   Type 2 diabetes mellitus with proteinuria (HCC)   Relevant Orders   Bayer DCA Hb A1c Waived   Type 2 diabetes mellitus with diabetic polyneuropathy (HCC) - Primary   Relevant Orders   Bayer DCA Hb A1c Waived   Microalbumin, Urine Waived   Hyperlipidemia associated with  type 2 diabetes mellitus (HCC)   Relevant Orders   Bayer DCA Hb A1c Waived   Comprehensive metabolic panel with GFR   Lipid Panel w/o Chol/HDL Ratio   Diabetes mellitus treated with oral medication (HCC)   Relevant Orders   Bayer DCA Hb A1c Waived     Nervous and Auditory   Chronic communicating hydrocephalus (HCC)     Other   Vitamin B12 deficiency   Relevant Orders   CBC with Differential/Platelet   Vitamin B12   Poor mobility   Obesity   History of stroke   Other Visit Diagnoses       Benign prostatic hyperplasia without lower urinary tract symptoms       Relevant Orders   PSA        Follow up plan: No follow-ups on file.

## 2023-10-27 NOTE — Assessment & Plan Note (Signed)
 Chronic, ongoing. A1c 6.0% today. Unable to obtain urine sample today. Continue Metformin  XR 500 mg daily. Report having maybe 1 to 2 hypoglycemic episodes. Unable to check BS at home due to not having supplies. Continue Gabapentin  for neuropathy pain. Refills sent to pharmacy. Recommend he continue to focus on diet and alcohol reduction. Recommend he check BS at home and document for provider. Return in 6 months.

## 2023-10-27 NOTE — Progress Notes (Signed)
 BP 130/82   Pulse 69   Temp (!) 97.5 F (36.4 C) (Oral)   Ht 6' 1.5" (1.867 m)   Wt 232 lb 3.2 oz (105.3 kg)   SpO2 98%   BMI 30.22 kg/m    Subjective:    Patient ID: [name], male    DOB: January 01, 1971, 53 y.o.   MRN: [id number]  HPI: [name] is a 53 y.o. male   Chief Complaint  Patient presents with   Hypertension   Hyperlipidemia   Diabetes   Guardian at bedside to assist with HPI.  DIABETES Taking Metformin  XR 1000 MG BID. A1c 5.5% June 2024, has been lost to follow-up since then with multiple missed appointments. Current alcohol use and reports   History of B12 deficiency, recent levels improved with supplement.    Hypoglycemic episodes: maybe once or twice Polydipsia/polyuria: no Visual disturbance: no Chest pain: no Paresthesias: no Glucose Monitoring: no  Accucheck frequency: Not Checking  Taking Insulin ?: no Blood Pressure Monitoring: not checking Retinal Examination: Not up to Date Foot Exam: Not up to Date Diabetic Education: Completed Pneumovax: Not up to Date Influenza: Not up to Date Aspirin :  Not been taking recently. Out of medication    HYPERTENSION / HYPERLIPIDEMIA Taking Amlodipine  10 MG, ASA, Plavix , Carvedilol  6.25 MG BID, Lisinopril  5 MG QDAY, and Lipitor  80 MG daily.  Has not seen cardiology since 11/06/21. Cardiomyopathy on diagnosis list from past history.   Has history of CVA, continues on Aricept  for memory changes.  Has underlying chronic communicating hydrocephalus.  Has not seen neurology since 06/20/20.  Has underlying issues with mobility due to past CVA. Satisfied with current treatment? yes Duration of hypertension: chronic BP monitoring frequency: not checking BP medication side effects: no Duration of hyperlipidemia: chronic Cholesterol medication side effects: no Cholesterol supplements: none Medication compliance: poor compliance Aspirin : no Recent stressors: no Recurrent headaches: no Visual changes:  no Palpitations: no Dyspnea: no Chest pain: no Lower extremity edema: no Dizzy/lightheaded: no   URINARY INCONTINENCE Continues on Flomax  daily. Last visit he reported urinary incontinence was improving.  Reports some dysuria today. Reports urinary urgency and frequency. No burning involved. Been out of medications and BS testing supplies for about a week. Duration: chronic Nocturia: no Urinary frequency:yes Incomplete voiding: no Urgency: yes Weak urinary stream: no Straining to start stream: yes Dysuria: no     12/30/2022    2:15 PM 07/12/2022    5:13 PM 09/10/2021    2:51 PM 07/10/2021    9:14 AM 04/24/2021   11:05 AM  Depression screen PHQ 2/9  Decreased Interest 0 1 0 0 0  Down, Depressed, Hopeless 0 0 0 0 0  PHQ - 2 Score 0 1 0 0 0  Altered sleeping 0 1 0  0  Tired, decreased energy 0 1 0  0  Change in appetite 0 0 0  0  Feeling bad or failure about yourself  0 0 0  0  Trouble concentrating 0 0 0  1  Moving slowly or fidgety/restless 0 0 0  0  Suicidal thoughts 0 0 0  0  PHQ-9 Score 0 3 0  1  Difficult doing work/chores Not difficult at all Not difficult at all Not difficult at all  Not difficult at all       12/30/2022    2:16 PM 07/12/2022    5:13 PM 04/24/2021   11:07 AM 10/01/2019   12:11 PM  GAD 7 : Generalized Anxiety  Score  Nervous, Anxious, on Edge 0 0 0 0  Control/stop worrying 0 0 0 0  Worry too much - different things 0 0 0 0  Trouble relaxing 0 0 0 0  Restless 0 0 0 0  Easily annoyed or irritable 0 0 0 0  Afraid - awful might happen 0 0 0 0  Total GAD 7 Score 0 0 0 0  Anxiety Difficulty Not difficult at all Not difficult at all Not difficult at all       Relevant past medical, surgical, family and social history reviewed and updated as indicated. Interim medical history since our last visit reviewed. Allergies and medications reviewed and updated.  Review of Systems  Constitutional:  Positive for appetite change. Negative for fatigue.  HENT:  Negative.    Eyes: Negative.   Respiratory:  Negative for chest tightness and shortness of breath.   Cardiovascular:  Negative for chest pain and leg swelling.  Gastrointestinal:  Negative for abdominal pain.  Endocrine: Negative for polydipsia and polyuria.  Genitourinary:  Positive for difficulty urinating and urgency. Negative for flank pain and hematuria.  Musculoskeletal:  Negative for back pain.  Skin: Negative.   Allergic/Immunologic: Negative.   Neurological:  Negative for light-headedness and headaches.  Hematological: Negative.   Psychiatric/Behavioral:  Negative for sleep disturbance. The patient is not nervous/anxious.    Per HPI unless specifically indicated above     Objective:    BP 130/82   Pulse 69   Temp (!) 97.5 F (36.4 C) (Oral)   Ht 6' 1.5" (1.867 m)   Wt 232 lb 3.2 oz (105.3 kg)   SpO2 98%   BMI 30.22 kg/m   Wt Readings from Last 3 Encounters:  10/27/23 232 lb 3.2 oz (105.3 kg)  07/26/23 250 lb (113.4 kg)  12/30/22 253 lb (114.8 kg)    Physical Exam Vitals and nursing note reviewed.  Constitutional:      Appearance: Normal appearance. He is well-developed and well-groomed. He is obese. He is not ill-appearing.  Neck:     Thyroid : No thyroid  mass.     Vascular: No carotid bruit.  Cardiovascular:     Rate and Rhythm: Normal rate and regular rhythm.     Heart sounds: Normal heart sounds. No murmur heard. Pulmonary:     Effort: Pulmonary effort is normal. No respiratory distress.     Breath sounds: Normal breath sounds. No wheezing.  Abdominal:     General: Bowel sounds are normal. There is no distension.     Palpations: Abdomen is soft.     Tenderness: There is no guarding.  Musculoskeletal:        General: Normal range of motion.     Cervical back: Normal range of motion and neck supple.     Right lower leg: No edema.     Left lower leg: No edema.  Lymphadenopathy:     Cervical: No cervical adenopathy.     Right cervical: No superficial  cervical adenopathy.    Left cervical: No superficial cervical adenopathy.  Skin:    General: Skin is warm and dry.  Neurological:     General: No focal deficit present.     Mental Status: He is alert and oriented to person, place, and time. Mental status is at baseline.     Deep Tendon Reflexes: Reflexes are normal and symmetric.  Psychiatric:        Attention and Perception: Attention and perception normal.  Mood and Affect: Mood and affect normal.        Speech: Speech normal.        Behavior: Behavior normal. Behavior is cooperative.        Thought Content: Thought content normal.        Cognition and Memory: Cognition and memory normal.        Judgment: Judgment normal.     Results for orders placed or performed in visit on 10/27/23  Bayer DCA Hb A1c Waived   Collection Time: 10/27/23  2:49 PM  Result Value Ref Range   HB A1C (BAYER DCA - WAIVED) 6.0 (H) 4.8 - 5.6 %      Assessment & Plan:   Problem List Items Addressed This Visit       Cardiovascular and Mediastinum   Hypertension associated with diabetes (HCC)   Chronic, ongoing. BP at goal today in office. Continue current medication regimen. Will adjust as needed. Recommend checking BP at home several times per week and documenting for provider. Unable to obtain Microalbumin today. Labs: CMP. Refills sent to pharmacy. Return in 6 months.      Relevant Medications   aspirin  EC (ASPIR-LOW) 81 MG tablet   atorvastatin  (LIPITOR ) 80 MG tablet   carvedilol  (COREG ) 6.25 MG tablet   lisinopril  (ZESTRIL ) 5 MG tablet   metFORMIN  (GLUCOPHAGE -XR) 500 MG 24 hr tablet   amLODipine  (NORVASC ) 10 MG tablet   Other Relevant Orders   Bayer DCA Hb A1c Waived (Completed)   Microalbumin, Urine Waived   CBC with Differential/Platelet   TSH   Cardiomyopathy (HCC)   Ongoing, stable. Will continue collaboration with cardiology. Recommend he follow up with cardiology.      Relevant Medications   aspirin  EC (ASPIR-LOW) 81 MG  tablet   atorvastatin  (LIPITOR ) 80 MG tablet   carvedilol  (COREG ) 6.25 MG tablet   lisinopril  (ZESTRIL ) 5 MG tablet   amLODipine  (NORVASC ) 10 MG tablet     Endocrine   Type 2 diabetes mellitus with proteinuria (HCC)   Chronic, ongoing. A1c 6.0% today. Unable to obtain urine sample today. Continue Metformin  XR 500 mg daily. Report having maybe 1 to 2 hypoglycemic episodes. Unable to check BS at home due to not having supplies. Refills sent to pharmacy. Recommend he continue to focus on diet and alcohol reduction. Recommend he check BS at home and document for provider. Return in 6 months.      Relevant Medications   aspirin  EC (ASPIR-LOW) 81 MG tablet   atorvastatin  (LIPITOR ) 80 MG tablet   lisinopril  (ZESTRIL ) 5 MG tablet   metFORMIN  (GLUCOPHAGE -XR) 500 MG 24 hr tablet   Other Relevant Orders   Bayer DCA Hb A1c Waived (Completed)   Type 2 diabetes mellitus with diabetic polyneuropathy (HCC) - Primary   Chronic, ongoing. A1c 6.0% today. Unable to obtain urine sample today. Continue Metformin  XR 500 mg daily. Report having maybe 1 to 2 hypoglycemic episodes. Unable to check BS at home due to not having supplies. Continue Gabapentin  for neuropathy pain. Refills sent to pharmacy. Recommend he continue to focus on diet and alcohol reduction. Recommend he check BS at home and document for provider. Return in 6 months.       Relevant Medications   aspirin  EC (ASPIR-LOW) 81 MG tablet   atorvastatin  (LIPITOR ) 80 MG tablet   citalopram  (CELEXA ) 10 MG tablet   donepezil  (ARICEPT ) 5 MG tablet   gabapentin  (NEURONTIN ) 300 MG capsule   lisinopril  (ZESTRIL ) 5 MG tablet   metFORMIN  (  GLUCOPHAGE -XR) 500 MG 24 hr tablet   Other Relevant Orders   Bayer DCA Hb A1c Waived (Completed)   Microalbumin, Urine Waived   Hyperlipidemia associated with type 2 diabetes mellitus (HCC)   Chronic, ongoing. Continue current medication regimen Atorvastatin  80 mg. Will adjust as needed. Lipid panel today. Refills sent  to pharmacy. Return in 6 months.      Relevant Medications   aspirin  EC (ASPIR-LOW) 81 MG tablet   atorvastatin  (LIPITOR ) 80 MG tablet   carvedilol  (COREG ) 6.25 MG tablet   lisinopril  (ZESTRIL ) 5 MG tablet   metFORMIN  (GLUCOPHAGE -XR) 500 MG 24 hr tablet   amLODipine  (NORVASC ) 10 MG tablet   Other Relevant Orders   Bayer DCA Hb A1c Waived (Completed)   Comprehensive metabolic panel with GFR   Lipid Panel w/o Chol/HDL Ratio   Diabetes mellitus treated with oral medication (HCC)   Chronic, ongoing. A1c 6.0% today. Continue current medication regimen Metformin  XR 500 mg daily. Refills sent to pharmacy of choice. Return in 6 months.      Relevant Medications   aspirin  EC (ASPIR-LOW) 81 MG tablet   atorvastatin  (LIPITOR ) 80 MG tablet   lisinopril  (ZESTRIL ) 5 MG tablet   metFORMIN  (GLUCOPHAGE -XR) 500 MG 24 hr tablet   Other Relevant Orders   Bayer DCA Hb A1c Waived (Completed)     Nervous and Auditory   Chronic communicating hydrocephalus (HCC)   Chronic, ongoing. Continue collaboration with neurology and memory medication as prescribed by them. Recommend he continue seeing neurology for follow ups.         Other   Vitamin B12 deficiency   Chronic, ongoing. Labs checked today. Continue current daily supplement. Return in 6 months.      Relevant Orders   CBC with Differential/Platelet   Vitamin B12   Poor mobility   Chronic, ongoing. Recommend to continue use of Rolator and W/C as needed. Return in 6 months.      Obesity   BMI 30.22. Down from 32.92 12/30/2022. Continue to recommend eating smaller more frequent high protein, low fat meals. Exercising 5 times per week for 30 mins per day. Patient verbalized understanding of these recommendations. Encouraged to continue to cut back on alcohol.      Relevant Medications   metFORMIN  (GLUCOPHAGE -XR) 500 MG 24 hr tablet   History of stroke   A1c 6.0% today. Labs today CBC, CMP, Lipid panel, TSH, Vitamin B12. BP at goal today in  office. Will continue collaboration with neurology.      Other Visit Diagnoses       Benign prostatic hyperplasia without lower urinary tract symptoms       PSA today   Relevant Medications   tamsulosin  (FLOMAX ) 0.4 MG CAPS capsule   Other Relevant Orders   PSA     Dysuria       Unable to provide urine sample today.   Relevant Orders   Urinalysis, Routine w reflex microscopic        Follow up plan: Return in about 6 months (around 04/27/2024) for T2DM, HTN/HLD, MOOD, BPH.

## 2023-10-27 NOTE — Assessment & Plan Note (Signed)
 Ongoing, stable. Will continue collaboration with cardiology. Recommend he follow up with cardiology.

## 2023-10-27 NOTE — Assessment & Plan Note (Addendum)
 Chronic, ongoing. A1c 6.0% today. Continue current medication regimen Metformin  XR 500 mg daily. Refills sent to pharmacy of choice. Return in 6 months.

## 2023-10-27 NOTE — Assessment & Plan Note (Signed)
 Chronic, ongoing. BP at goal today in office. Continue current medication regimen. Will adjust as needed. Recommend checking BP at home several times per week and documenting for provider. Unable to obtain Microalbumin today. Labs: CMP. Refills sent to pharmacy. Return in 6 months.

## 2023-10-27 NOTE — Assessment & Plan Note (Signed)
 A1c 6.0% today. Labs today CBC, CMP, Lipid panel, TSH, Vitamin B12. BP at goal today in office. Will continue collaboration with neurology.

## 2023-10-27 NOTE — Assessment & Plan Note (Signed)
 Chronic, ongoing. Labs checked today. Continue current daily supplement. Return in 6 months.

## 2023-10-27 NOTE — Assessment & Plan Note (Signed)
 BMI 30.22. Down from 32.92 12/30/2022. Continue to recommend eating smaller more frequent high protein, low fat meals. Exercising 5 times per week for 30 mins per day. Patient verbalized understanding of these recommendations. Encouraged to continue to cut back on alcohol.

## 2023-10-27 NOTE — Assessment & Plan Note (Signed)
 Chronic, ongoing. Continue collaboration with neurology and memory medication as prescribed by them. Recommend he continue seeing neurology for follow ups.

## 2023-10-28 ENCOUNTER — Encounter: Payer: Self-pay | Admitting: Nurse Practitioner

## 2023-10-28 LAB — LIPID PANEL W/O CHOL/HDL RATIO
Cholesterol, Total: 153 mg/dL (ref 100–199)
HDL: 59 mg/dL (ref >39)
LDL Chol Calc (NIH): 81 mg/dL (ref 0–99)
Triglycerides: 66 mg/dL (ref 0–149)
VLDL Cholesterol Cal: 13 mg/dL (ref 5–40)

## 2023-10-28 LAB — COMPREHENSIVE METABOLIC PANEL WITH GFR
ALT: 20 IU/L (ref 0–44)
AST: 15 IU/L (ref 0–40)
Albumin: 4.7 g/dL (ref 3.8–4.9)
Alkaline Phosphatase: 83 IU/L (ref 44–121)
BUN/Creatinine Ratio: 18 (ref 9–20)
BUN: 24 mg/dL (ref 6–24)
Bilirubin Total: 0.5 mg/dL (ref 0.0–1.2)
CO2: 24 mmol/L (ref 20–29)
Calcium: 9.7 mg/dL (ref 8.7–10.2)
Chloride: 108 mmol/L — ABNORMAL HIGH (ref 96–106)
Creatinine, Ser: 1.33 mg/dL — ABNORMAL HIGH (ref 0.76–1.27)
Globulin, Total: 3.1 g/dL (ref 1.5–4.5)
Glucose: 104 mg/dL — ABNORMAL HIGH (ref 70–99)
Potassium: 4.6 mmol/L (ref 3.5–5.2)
Sodium: 150 mmol/L — ABNORMAL HIGH (ref 134–144)
Total Protein: 7.8 g/dL (ref 6.0–8.5)
eGFR: 65 mL/min/{1.73_m2} (ref >59)

## 2023-10-28 LAB — VITAMIN B12: Vitamin B-12: 952 pg/mL (ref 232–1245)

## 2023-10-28 LAB — CBC WITH DIFFERENTIAL/PLATELET
Basophils Absolute: 0 10*3/uL (ref 0.0–0.2)
Basos: 1 %
EOS (ABSOLUTE): 0.1 10*3/uL (ref 0.0–0.4)
Eos: 3 %
Hematocrit: 43.7 % (ref 37.5–51.0)
Hemoglobin: 14.1 g/dL (ref 13.0–17.7)
Immature Grans (Abs): 0 10*3/uL (ref 0.0–0.1)
Immature Granulocytes: 0 %
Lymphocytes Absolute: 1.4 10*3/uL (ref 0.7–3.1)
Lymphs: 39 %
MCH: 29.3 pg (ref 26.6–33.0)
MCHC: 32.3 g/dL (ref 31.5–35.7)
MCV: 91 fL (ref 79–97)
Monocytes Absolute: 0.2 10*3/uL (ref 0.1–0.9)
Monocytes: 6 %
Neutrophils Absolute: 1.8 10*3/uL (ref 1.4–7.0)
Neutrophils: 51 %
Platelets: 219 10*3/uL (ref 150–450)
RBC: 4.82 x10E6/uL (ref 4.14–5.80)
RDW: 13.2 % (ref 11.6–15.4)
WBC: 3.6 10*3/uL (ref 3.4–10.8)

## 2023-10-28 LAB — PSA: Prostate Specific Ag, Serum: 1.1 ng/mL (ref 0.0–4.0)

## 2023-10-28 LAB — TSH: TSH: 0.652 u[IU]/mL (ref 0.450–4.500)

## 2023-10-28 NOTE — Progress Notes (Signed)
 Contacted via MyChart   Good evening [name], your labs have returned and are overall stable with exception of mild elevation in creatinine and sodium, salt.  I recommend increasing your water intake at home and reduce salt intake.  Cut back on alcohol use too.  We will recheck this at next visit.  Any questions? Keep being stellar!!  Thank you for allowing me to participate in your care.  I appreciate you. Kindest regards, Nester Bachus

## 2023-10-30 ENCOUNTER — Telehealth: Payer: Self-pay

## 2023-10-30 NOTE — Telephone Encounter (Signed)
 Called and notified patient's brother that the [facility] form was completed and ready to be picked up.

## 2023-12-06 ENCOUNTER — Other Ambulatory Visit: Payer: Self-pay

## 2023-12-06 ENCOUNTER — Inpatient Hospital Stay
Admission: EM | Admit: 2023-12-06 | Discharge: 2023-12-11 | DRG: 871 | Disposition: A | Discharge status: Skilled Nursing Facility | Attending: Internal Medicine | Admitting: Internal Medicine

## 2023-12-06 ENCOUNTER — Emergency Department

## 2023-12-06 DIAGNOSIS — Z79899 Other long term (current) drug therapy: Secondary | ICD-10-CM | POA: Diagnosis not present

## 2023-12-06 DIAGNOSIS — G910 Communicating hydrocephalus: Secondary | ICD-10-CM | POA: Diagnosis present

## 2023-12-06 DIAGNOSIS — N390 Urinary tract infection, site not specified: Secondary | ICD-10-CM | POA: Diagnosis present

## 2023-12-06 DIAGNOSIS — R7989 Other specified abnormal findings of blood chemistry: Secondary | ICD-10-CM | POA: Diagnosis not present

## 2023-12-06 DIAGNOSIS — E870 Hyperosmolality and hypernatremia: Secondary | ICD-10-CM

## 2023-12-06 DIAGNOSIS — D72819 Decreased white blood cell count, unspecified: Secondary | ICD-10-CM

## 2023-12-06 DIAGNOSIS — D61818 Other pancytopenia: Secondary | ICD-10-CM | POA: Diagnosis present

## 2023-12-06 DIAGNOSIS — E872 Acidosis, unspecified: Secondary | ICD-10-CM | POA: Diagnosis present

## 2023-12-06 DIAGNOSIS — K922 Gastrointestinal hemorrhage, unspecified: Secondary | ICD-10-CM | POA: Diagnosis not present

## 2023-12-06 DIAGNOSIS — B9620 Unspecified Escherichia coli [E. coli] as the cause of diseases classified elsewhere: Secondary | ICD-10-CM | POA: Diagnosis present

## 2023-12-06 DIAGNOSIS — Y92008 Other place in unspecified non-institutional (private) residence as the place of occurrence of the external cause: Secondary | ICD-10-CM | POA: Diagnosis not present

## 2023-12-06 DIAGNOSIS — Z8261 Family history of arthritis: Secondary | ICD-10-CM

## 2023-12-06 DIAGNOSIS — I69351 Hemiplegia and hemiparesis following cerebral infarction affecting right dominant side: Secondary | ICD-10-CM

## 2023-12-06 DIAGNOSIS — D696 Thrombocytopenia, unspecified: Secondary | ICD-10-CM | POA: Diagnosis not present

## 2023-12-06 DIAGNOSIS — E785 Hyperlipidemia, unspecified: Secondary | ICD-10-CM | POA: Diagnosis present

## 2023-12-06 DIAGNOSIS — F32A Depression, unspecified: Secondary | ICD-10-CM | POA: Diagnosis present

## 2023-12-06 DIAGNOSIS — Z7902 Long term (current) use of antithrombotics/antiplatelets: Secondary | ICD-10-CM

## 2023-12-06 DIAGNOSIS — W19XXXA Unspecified fall, initial encounter: Secondary | ICD-10-CM

## 2023-12-06 DIAGNOSIS — A419 Sepsis, unspecified organism: Principal | ICD-10-CM

## 2023-12-06 DIAGNOSIS — K254 Chronic or unspecified gastric ulcer with hemorrhage: Secondary | ICD-10-CM | POA: Diagnosis present

## 2023-12-06 DIAGNOSIS — Z6833 Body mass index (BMI) 33.0-33.9, adult: Secondary | ICD-10-CM

## 2023-12-06 DIAGNOSIS — R6510 Systemic inflammatory response syndrome (SIRS) of non-infectious origin without acute organ dysfunction: Principal | ICD-10-CM | POA: Diagnosis present

## 2023-12-06 DIAGNOSIS — E1159 Type 2 diabetes mellitus with other circulatory complications: Secondary | ICD-10-CM

## 2023-12-06 DIAGNOSIS — R6520 Severe sepsis without septic shock: Secondary | ICD-10-CM | POA: Diagnosis present

## 2023-12-06 DIAGNOSIS — N400 Enlarged prostate without lower urinary tract symptoms: Secondary | ICD-10-CM | POA: Diagnosis present

## 2023-12-06 DIAGNOSIS — I152 Hypertension secondary to endocrine disorders: Secondary | ICD-10-CM

## 2023-12-06 DIAGNOSIS — D509 Iron deficiency anemia, unspecified: Secondary | ICD-10-CM | POA: Diagnosis present

## 2023-12-06 DIAGNOSIS — I110 Hypertensive heart disease with heart failure: Secondary | ICD-10-CM | POA: Diagnosis present

## 2023-12-06 DIAGNOSIS — Z8673 Personal history of transient ischemic attack (TIA), and cerebral infarction without residual deficits: Secondary | ICD-10-CM

## 2023-12-06 DIAGNOSIS — K319 Disease of stomach and duodenum, unspecified: Secondary | ICD-10-CM | POA: Diagnosis not present

## 2023-12-06 DIAGNOSIS — N3000 Acute cystitis without hematuria: Secondary | ICD-10-CM

## 2023-12-06 DIAGNOSIS — Z7982 Long term (current) use of aspirin: Secondary | ICD-10-CM | POA: Diagnosis not present

## 2023-12-06 DIAGNOSIS — I5032 Chronic diastolic (congestive) heart failure: Secondary | ICD-10-CM | POA: Diagnosis present

## 2023-12-06 DIAGNOSIS — E1142 Type 2 diabetes mellitus with diabetic polyneuropathy: Secondary | ICD-10-CM | POA: Diagnosis present

## 2023-12-06 DIAGNOSIS — Y92009 Unspecified place in unspecified non-institutional (private) residence as the place of occurrence of the external cause: Secondary | ICD-10-CM

## 2023-12-06 DIAGNOSIS — Z8249 Family history of ischemic heart disease and other diseases of the circulatory system: Secondary | ICD-10-CM | POA: Diagnosis not present

## 2023-12-06 DIAGNOSIS — T68XXXA Hypothermia, initial encounter: Principal | ICD-10-CM

## 2023-12-06 DIAGNOSIS — R680 Hypothermia, not associated with low environmental temperature: Secondary | ICD-10-CM | POA: Diagnosis present

## 2023-12-06 DIAGNOSIS — Z833 Family history of diabetes mellitus: Secondary | ICD-10-CM

## 2023-12-06 DIAGNOSIS — Z823 Family history of stroke: Secondary | ICD-10-CM

## 2023-12-06 DIAGNOSIS — E1169 Type 2 diabetes mellitus with other specified complication: Secondary | ICD-10-CM

## 2023-12-06 DIAGNOSIS — Z7984 Long term (current) use of oral hypoglycemic drugs: Secondary | ICD-10-CM

## 2023-12-06 DIAGNOSIS — G9389 Other specified disorders of brain: Secondary | ICD-10-CM | POA: Diagnosis present

## 2023-12-06 DIAGNOSIS — W1830XA Fall on same level, unspecified, initial encounter: Secondary | ICD-10-CM | POA: Diagnosis present

## 2023-12-06 DIAGNOSIS — E669 Obesity, unspecified: Secondary | ICD-10-CM

## 2023-12-06 DIAGNOSIS — Z87891 Personal history of nicotine dependence: Secondary | ICD-10-CM

## 2023-12-06 LAB — CBC WITH DIFFERENTIAL/PLATELET
Abs Immature Granulocytes: 0.01 10*3/uL (ref 0.00–0.07)
Basophils Absolute: 0 10*3/uL (ref 0.0–0.1)
Basophils Relative: 0 %
Eosinophils Absolute: 0.2 10*3/uL (ref 0.0–0.5)
Eosinophils Relative: 5 %
HCT: 38.7 % — ABNORMAL LOW (ref 39.0–52.0)
Hemoglobin: 11.8 g/dL — ABNORMAL LOW (ref 13.0–17.0)
Immature Granulocytes: 0 %
Lymphocytes Relative: 30 %
Lymphs Abs: 1 10*3/uL (ref 0.7–4.0)
MCH: 29.2 pg (ref 26.0–34.0)
MCHC: 30.5 g/dL (ref 30.0–36.0)
MCV: 95.8 fL (ref 80.0–100.0)
Monocytes Absolute: 0.3 10*3/uL (ref 0.1–1.0)
Monocytes Relative: 8 %
Neutro Abs: 2 10*3/uL (ref 1.7–7.7)
Neutrophils Relative %: 57 %
Platelets: 118 10*3/uL — ABNORMAL LOW (ref 150–400)
RBC: 4.04 MIL/uL — ABNORMAL LOW (ref 4.22–5.81)
RDW: 14.8 % (ref 11.5–15.5)
WBC: 3.5 10*3/uL — ABNORMAL LOW (ref 4.0–10.5)
nRBC: 0.9 % — ABNORMAL HIGH (ref 0.0–0.2)

## 2023-12-06 LAB — CBG MONITORING, ED: Glucose-Capillary: 98 mg/dL (ref 70–99)

## 2023-12-06 LAB — COMPREHENSIVE METABOLIC PANEL WITH GFR
ALT: 77 U/L — ABNORMAL HIGH (ref 0–44)
AST: 63 U/L — ABNORMAL HIGH (ref 15–41)
Albumin: 3.8 g/dL (ref 3.5–5.0)
Alkaline Phosphatase: 70 U/L (ref 38–126)
Anion gap: 11 (ref 5–15)
BUN: 36 mg/dL — ABNORMAL HIGH (ref 6–20)
CO2: 23 mmol/L (ref 22–32)
Calcium: 8.7 mg/dL — ABNORMAL LOW (ref 8.9–10.3)
Chloride: 116 mmol/L — ABNORMAL HIGH (ref 98–111)
Creatinine, Ser: 1.31 mg/dL — ABNORMAL HIGH (ref 0.61–1.24)
GFR, Estimated: >60 mL/min (ref >60)
Glucose, Bld: 82 mg/dL (ref 70–99)
Potassium: 3.8 mmol/L (ref 3.5–5.1)
Sodium: 150 mmol/L — ABNORMAL HIGH (ref 135–145)
Total Bilirubin: 0.4 mg/dL (ref 0.0–1.2)
Total Protein: 7.2 g/dL (ref 6.5–8.1)

## 2023-12-06 LAB — LACTIC ACID, PLASMA
Lactic Acid, Venous: 3.1 mmol/L (ref 0.5–1.9)
Lactic Acid, Venous: 3 mmol/L (ref 0.5–1.9)

## 2023-12-06 MED ORDER — IBUPROFEN 400 MG PO TABS: 200.0000 mg | ORAL_TABLET | Freq: Four times a day (QID) | ORAL | Status: DC | PRN

## 2023-12-06 MED ORDER — SODIUM CHLORIDE 0.45 % IV SOLN
INTRAVENOUS | Status: DC
  Administered 2023-12-07: 75 mL/h via INTRAVENOUS

## 2023-12-06 MED ORDER — SODIUM CHLORIDE 0.9 % IV SOLN
2.0000 g | Freq: Once | INTRAVENOUS | Status: AC
  Administered 2023-12-06: 2 g via INTRAVENOUS
  Filled 2023-12-06: qty 12.5

## 2023-12-06 MED ORDER — INSULIN ASPART 100 UNIT/ML IJ SOLN
0.0000 [IU] | Freq: Three times a day (TID) | INTRAMUSCULAR | Status: DC
  Administered 2023-12-07 – 2023-12-09 (×4): 1 [IU] via SUBCUTANEOUS
  Administered 2023-12-09 – 2023-12-10 (×2): 2 [IU] via SUBCUTANEOUS
  Administered 2023-12-10: 3 [IU] via SUBCUTANEOUS
  Filled 2023-12-06 (×6): qty 1

## 2023-12-06 MED ORDER — VANCOMYCIN HCL IN DEXTROSE 1-5 GM/200ML-% IV SOLN
1000.0000 mg | Freq: Once | INTRAVENOUS | Status: AC
  Administered 2023-12-06: 1000 mg via INTRAVENOUS
  Filled 2023-12-06: qty 200

## 2023-12-06 MED ORDER — HYDRALAZINE HCL 20 MG/ML IJ SOLN: 5.0000 mg | INTRAMUSCULAR | Status: DC | PRN

## 2023-12-06 MED ORDER — SODIUM CHLORIDE 0.9 % IV BOLUS
1000.0000 mL | Freq: Once | INTRAVENOUS | Status: AC
  Administered 2023-12-06: 1000 mL via INTRAVENOUS

## 2023-12-06 MED ORDER — LACTATED RINGERS IV BOLUS
1000.0000 mL | Freq: Once | INTRAVENOUS | Status: AC
  Administered 2023-12-06: 1000 mL via INTRAVENOUS

## 2023-12-06 MED ORDER — INSULIN ASPART 100 UNIT/ML IJ SOLN: 0.0000 [IU] | Freq: Every day | INTRAMUSCULAR | Status: DC

## 2023-12-06 MED ORDER — ONDANSETRON HCL 4 MG/2ML IJ SOLN: 4.0000 mg | Freq: Three times a day (TID) | INTRAMUSCULAR | Status: DC | PRN

## 2023-12-06 NOTE — ED Provider Notes (Incomplete)
 [facility] Provider Note    Event Date/Time   First MD Initiated Contact with Patient 12/06/23 2053     (approximate)   History   Fall and Altered Mental Status   HPI  [name] is a 53 y.o. male who presents to the emergency department today because of concerns for falls.  Patient is unfortunately not completely oriented to recent events however does state he is fallen a couple of times recently.  He complains of some right knee pain after a recent fall although has been able to walk afterwards.  Patient denies any fevers or chills.  Denies any chest pain or cough.      Physical Exam   Triage Vital Signs: ED Triage Vitals  Encounter Vitals Group     BP 12/06/23 1929 91/72     Systolic BP Percentile --      Diastolic BP Percentile --      Pulse Rate 12/06/23 1929 (!) 56     Resp 12/06/23 1929 12     Temp --      Temp src --      SpO2 12/06/23 1929 97 %     Weight 12/06/23 1935 220 lb (99.8 kg)     Height 12/06/23 1935 6\' 1"  (1.854 m)     Head Circumference --      Peak Flow --      Pain Score 12/06/23 1935 10     Pain Loc --      Pain Education --      Exclude from Growth Chart --     Most recent vital signs: Vitals:   12/06/23 1929  BP: 91/72  Pulse: (!) 56  Resp: 12  SpO2: 97%    {Only need to document appropriate and relevant physical exam:1} General: Awake, no distress. *** CV:  Good peripheral perfusion. *** Resp:  Normal effort. *** Abd:  No distention. *** Other:  ***   ED Results / Procedures / Treatments   Labs (all labs ordered are listed, but only abnormal results are displayed) Labs Reviewed  COMPREHENSIVE METABOLIC PANEL WITH GFR - Abnormal; Notable for the following components:      Result Value   Sodium 150 (*)    Chloride 116 (*)    BUN 36 (*)    Creatinine, Ser 1.31 (*)    Calcium  8.7 (*)    AST 63 (*)    ALT 77 (*)    All other components within normal limits  LACTIC ACID, PLASMA - Abnormal;  Notable for the following components:   Lactic Acid, Venous 3.1 (*)    All other components within normal limits  CBC WITH DIFFERENTIAL/PLATELET - Abnormal; Notable for the following components:   WBC 3.5 (*)    RBC 4.04 (*)    Hemoglobin 11.8 (*)    HCT 38.7 (*)    Platelets 118 (*)    nRBC 0.9 (*)    All other components within normal limits  LACTIC ACID, PLASMA  URINALYSIS, W/ REFLEX TO CULTURE (INFECTION SUSPECTED)     EKG  ***   RADIOLOGY *** {USE THE WORD "INTERPRETED"!! You MUST document your own interpretation of imaging, as well as the fact that you reviewed the radiologist's report!:1}   PROCEDURES:  Critical Care performed: Yes  CRITICAL CARE Performed by: Marylynn Soho   Total critical care time: *** minutes  Critical care time was exclusive of separately billable procedures and treating other patients.  Critical care  was necessary to treat or prevent imminent or life-threatening deterioration.  Critical care was time spent personally by me on the following activities: development of treatment plan with patient and/or surrogate as well as nursing, discussions with consultants, evaluation of patient's response to treatment, examination of patient, obtaining history from patient or surrogate, ordering and performing treatments and interventions, ordering and review of laboratory studies, ordering and review of radiographic studies, pulse oximetry and re-evaluation of patient's condition.   Procedures    MEDICATIONS ORDERED IN ED: Medications - No data to display   IMPRESSION / MDM / ASSESSMENT AND PLAN / ED COURSE  I reviewed the triage vital signs and the nursing notes.                              Differential diagnosis includes, but is not limited to, ***  Patient's presentation is most consistent with {EM COPA:27473}   ***The patient is on the cardiac monitor to evaluate for evidence of arrhythmia and/or significant heart rate  changes.  ***      FINAL CLINICAL IMPRESSION(S) / ED DIAGNOSES   Final diagnoses:  None        Rx / DC Orders   ED Discharge Orders     None        Note:  This document was prepared using Dragon voice recognition software and may include unintentional dictation errors.

## 2023-12-06 NOTE — ED Provider Notes (Signed)
 [facility] Provider Note    Event Date/Time   First MD Initiated Contact with Patient 12/06/23 2053     (approximate)   History   Fall and Altered Mental Status   HPI {Remember to add pertinent medical, surgical, social, and/or OB history to HPI:1} [name] is a 53 y.o. male  ***       Physical Exam   Triage Vital Signs: ED Triage Vitals  Encounter Vitals Group     BP 12/06/23 1929 91/72     Systolic BP Percentile --      Diastolic BP Percentile --      Pulse Rate 12/06/23 1929 (!) 56     Resp 12/06/23 1929 12     Temp --      Temp src --      SpO2 12/06/23 1929 97 %     Weight 12/06/23 1935 220 lb (99.8 kg)     Height 12/06/23 1935 6\' 1"  (1.854 m)     Head Circumference --      Peak Flow --      Pain Score 12/06/23 1935 10     Pain Loc --      Pain Education --      Exclude from Growth Chart --     Most recent vital signs: Vitals:   12/06/23 1929  BP: 91/72  Pulse: (!) 56  Resp: 12  SpO2: 97%    {Only need to document appropriate and relevant physical exam:1} General: Awake, no distress. *** CV:  Good peripheral perfusion. *** Resp:  Normal effort. *** Abd:  No distention. *** Other:  ***   ED Results / Procedures / Treatments   Labs (all labs ordered are listed, but only abnormal results are displayed) Labs Reviewed  COMPREHENSIVE METABOLIC PANEL WITH GFR - Abnormal; Notable for the following components:      Result Value   Sodium 150 (*)    Chloride 116 (*)    BUN 36 (*)    Creatinine, Ser 1.31 (*)    Calcium  8.7 (*)    AST 63 (*)    ALT 77 (*)    All other components within normal limits  LACTIC ACID, PLASMA - Abnormal; Notable for the following components:   Lactic Acid, Venous 3.1 (*)    All other components within normal limits  CBC WITH DIFFERENTIAL/PLATELET - Abnormal; Notable for the following components:   WBC 3.5 (*)    RBC 4.04 (*)    Hemoglobin 11.8 (*)    HCT 38.7 (*)    Platelets 118 (*)     nRBC 0.9 (*)    All other components within normal limits  LACTIC ACID, PLASMA  URINALYSIS, W/ REFLEX TO CULTURE (INFECTION SUSPECTED)     EKG  ***   RADIOLOGY *** {USE THE WORD "INTERPRETED"!! You MUST document your own interpretation of imaging, as well as the fact that you reviewed the radiologist's report!:1}   PROCEDURES:  Critical Care performed: Yes  CRITICAL CARE Performed by: Marylynn Soho   Total critical care time: *** minutes  Critical care time was exclusive of separately billable procedures and treating other patients.  Critical care was necessary to treat or prevent imminent or life-threatening deterioration.  Critical care was time spent personally by me on the following activities: development of treatment plan with patient and/or surrogate as well as nursing, discussions with consultants, evaluation of patient's response to treatment, examination of patient, obtaining history from patient or  surrogate, ordering and performing treatments and interventions, ordering and review of laboratory studies, ordering and review of radiographic studies, pulse oximetry and re-evaluation of patient's condition.   Procedures    MEDICATIONS ORDERED IN ED: Medications - No data to display   IMPRESSION / MDM / ASSESSMENT AND PLAN / ED COURSE  I reviewed the triage vital signs and the nursing notes.                              Differential diagnosis includes, but is not limited to, ***  Patient's presentation is most consistent with {EM COPA:27473}   ***The patient is on the cardiac monitor to evaluate for evidence of arrhythmia and/or significant heart rate changes.  ***      FINAL CLINICAL IMPRESSION(S) / ED DIAGNOSES   Final diagnoses:  None        Rx / DC Orders   ED Discharge Orders     None        Note:  This document was prepared using Dragon voice recognition software and may include unintentional dictation errors.
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
## 2023-12-06 NOTE — H&P (Signed)
 History and Physical    [name] ZOX:[id number] DOB: 04-07-1971 DOA: 12/06/2023  Referring MD/NP/PA:   PCP: [name], NP   Patient coming from:  The patient is coming from home.     Chief Complaint: fall, dark stool, weakness  HPI: [name] is a 53 y.o. male with medical history significant of stroke, community hydrocephalus, possible mild dementia on donepezil , hypertension, hyperlipidemia, diabetes mellitus, diastolic CHF, depression, BPH, who presents with fall, dark stool, weakness.  Patient states he has been feeling weak recently, and has had multiple falls recently, with last fall happened this morning.  No LOC.  No significant injury.  He complains of right knee pain, which seem to be a chronic issue.  Patient talks slowly, but clear in talking.  Per reported, patient had right-sided weakness from previous stroke, but on my examination, patient's muscle strength in extremities seem to be normal.  No facial droop or slurred speech.  Patient denies chest pain, cough, SOB.  No nausea, vomiting, diarrhea or abdominal pain.  He reports intermittent dark stool for about 1 month.  No fever or chills.  Denies dysuria, hematuria or burning with urination.  Data reviewed independently and ED Course: pt was found to have pancytopenia with WBC 3.5, hemoglobin 11.8, platelet 118 (he had WBC 3.6, hemoglobin 4.1, platelet 219 on 10/27/2023), positive UA (cloudy appearance, large amount of leukocyte, positive nitrite, many bacteria, WBC > 50), GFR> 60, sodium 150, lactic acid 3.1 --> 3.0.  Hypothermia with a temperature 91.1, softer blood pressure 91/72, 101/64, heart rate 52, RR 20, oxygen  saturation 100% on room air.  Chest x-ray negative.  X-ray of right knee is negative for bony fracture, showed degenerative change.  Patient is admitted to PCU as inpatient.  Epic message is scheduled to send to Dr. Mamie Searles of GI for consult at 6 AM  EKG: Not done in ED, will get one.    Review of  Systems:   General: no fevers, chills, no body weight gain, has fatigue HEENT: no blurry vision, hearing changes or sore throat Respiratory: no dyspnea, coughing, wheezing CV: no chest pain, no palpitations GI: no nausea, vomiting, abdominal pain, diarrhea, constipation GU: no dysuria, burning on urination, increased urinary frequency, hematuria  Ext: has leg edema Neuro: no unilateral weakness, numbness, or tingling, no vision change or hearing loss. Has fall Skin: no rash, no skin tear. MSK: No muscle spasm, no deformity, no limitation of range of movement in spin. Has right knee pain Heme: No easy bruising.  Travel history: No recent long distant travel.   Allergy: No Known Allergies  Past Medical History:  Diagnosis Date   Depression    Diabetes mellitus without complication (HCC)    Diastolic dysfunction    a. 09/2020 Echo: EF 50-55%, no rwma, GrII DD. Nl RV size/fxn.  Mildly dil LA.   Hydrocephalus in adult [facility])    Hyperlipidemia    Hypertension    Seizures (HCC)    Stroke [facility])     Past Surgical History:  Procedure Laterality Date   COLONOSCOPY WITH PROPOFOL  N/A 03/08/2020   Procedure: COLONOSCOPY WITH PROPOFOL ;  Surgeon: Irby Mannan, MD;  Location: [facility] ENDOSCOPY;  Service: Endoscopy;  Laterality: N/A;   LOOP RECORDER IMPLANT  12/20/13   MDT LinQ implanted by Dr Carolynne Citron for cryptogenic stroke   LOOP RECORDER IMPLANT N/A 12/20/2013   Procedure: LOOP RECORDER IMPLANT;  Surgeon: Tammie Fall, MD;  Location: [facility] CATH LAB;  Service: Cardiovascular;  Laterality: N/A;    Social History:  reports that he has quit smoking. He has never used smokeless tobacco. He reports current alcohol use of about 2.0 standard drinks of alcohol per week. He reports that he does not use drugs.  Family History:  Family History  Problem Relation Age of Onset   Hypertension Mother    Diabetes Mother    Stroke Father    Heart disease Father    Hypertension Father    Stroke Brother     Arthritis Brother    Diabetes Maternal Grandfather      Prior to Admission medications   Medication Sig Start Date End Date Taking? Authorizing Provider  Accu-Chek Softclix Lancets lancets USE 2-3 TIMES A DAY AS DIRECTED 10/27/23   Cannady, [name] T, NP  acetaminophen  (TYLENOL ) 325 MG tablet Take 2 tablets (650 mg total) by mouth every 6 (six) hours as needed for mild pain (pain score 1-3). 10/27/23   Cannady, [name] T, NP  amLODipine  (NORVASC ) 10 MG tablet Take 1 tablet (10 mg total) by mouth daily. 10/27/23   Cannady, [name] T, NP  aspirin  EC (ASPIR-LOW) 81 MG tablet Take 1 tablet (81 mg total) by mouth daily. Swallow whole. 10/27/23   Cannady, [name] T, NP  atorvastatin  (LIPITOR ) 80 MG tablet TAKE 1 TABLET BY MOUTH ONCE DAILY AT 6:00PM 10/27/23   Cannady, [name] T, NP  Blood Glucose Monitoring Suppl (CONTOUR NEXT MONITOR) w/Device KIT Use to check blood sugar 3 times daily, fasting in morning with goal <130 and 2 hours after meals with goal <180.  Bring blood sugar log to visits.  Dx. E11.42 10/27/23   Cannady, [name] T, NP  Blood Pressure Monitoring (BLOOD PRESSURE MONITOR AUTOMAT) DEVI Use to monitor blood pressure once daily. 03/03/20   Cannady, [name] T, NP  carvedilol  (COREG ) 6.25 MG tablet Take 1 tablet (6.25 mg total) by mouth 2 (two) times daily with a meal. 10/27/23   Cannady, [name] T, NP  citalopram  (CELEXA ) 10 MG tablet Take 1 tablet (10 mg total) by mouth daily. 10/27/23   Cannady, [name] T, NP  clopidogrel  (PLAVIX ) 75 MG tablet TAKE 1 TABLET BY MOUTH ONCE EVERY MORNING WITH BREAKFAST 10/27/23   Cannady, [name] T, NP  cyanocobalamin (VITAMIN B12) 1000 MCG tablet Take 1 tablet (1,000 mcg total) by mouth daily. 10/27/23   Cannady, [name] T, NP  donepezil  (ARICEPT ) 5 MG tablet Take 1 tablet (5 mg total) by mouth daily. 10/27/23   Cannady, [name] T, NP  gabapentin  (NEURONTIN ) 300 MG capsule Take 1 capsule (300 mg total) by mouth 3 (three) times daily. 10/27/23   Cannady, [name] T, NP  glucose  blood (ACCU-CHEK GUIDE TEST) test strip Use as instructed 10/27/23   Cannady, [name] T, NP  lisinopril  (ZESTRIL ) 5 MG tablet Take 1 tablet (5 mg total) by mouth daily. 10/27/23   Cannady, [name] T, NP  metFORMIN  (GLUCOPHAGE -XR) 500 MG 24 hr tablet Take 1 tablet (500 mg total) by mouth 2 (two) times daily with a meal. 10/27/23   Cannady, [name] T, NP  Multiple Vitamin (MULTIVITAMIN) tablet Take 1 tablet by mouth daily.    [provider]  oxybutynin  (DITROPAN  XL) 10 MG 24 hr tablet Take 1 tablet (10 mg total) by mouth at bedtime. 10/27/23   Cannady, [name] T, NP  tamsulosin  (FLOMAX ) 0.4 MG CAPS capsule Take 1 capsule (0.4 mg total) by mouth daily after supper. 10/27/23   [name], NP    Physical Exam: Vitals:   12/06/23  1929 12/06/23 1935 12/06/23 2219 12/06/23 2240  BP: 91/72   101/64  Pulse: (!) 56   (!) 52  Resp: 12   20  Temp:   (!) 91.1 F (32.8 C) (!) 91.1 F (32.8 C)  TempSrc:   Rectal Rectal  SpO2: 97%   100%  Weight:  99.8 kg    Height:  6\' 1"  (1.854 m)     General: Not in acute distress HEENT:       Eyes: PERRL, EOMI, no jaundice       ENT: No discharge from the ears and nose, no pharynx injection, no tonsillar enlargement.        Neck: No JVD, no bruit, no mass felt. Heme: No neck lymph node enlargement. Cardiac: S1/S2, RRR, No murmurs, No gallops or rubs. Respiratory: No rales, wheezing, rhonchi or rubs. GI: Soft, nondistended, nontender, no rebound pain, no organomegaly, BS present. GU: No hematuria Ext: 1+ pitting leg edema bilaterally. 1+DP/PT pulse bilaterally. Musculoskeletal: No joint deformities, No joint redness or warmth, no limitation of ROM in spin. Has right knee tenderness Skin: No rashes.  Neuro: Alert, oriented X3, cranial nerves II-XII grossly intact, moves all extremities  Psych: Patient is not psychotic, no suicidal or hemocidal ideation.  Labs on Admission: I have personally reviewed following labs and imaging studies  CBC: Recent  Labs  Lab 12/06/23 1940 12/06/23 2358  WBC 3.5* 2.7*  NEUTROABS 2.0  --   HGB 11.8* 10.5*  HCT 38.7* 34.7*  MCV 95.8 96.4  PLT 118* 103*   Basic Metabolic Panel: Recent Labs  Lab 12/06/23 1940 12/06/23 2358  NA 150* 150*  K 3.8 3.9  CL 116* 116*  CO2 23 24  GLUCOSE 82 81  BUN 36* 35*  CREATININE 1.31* 1.33*  CALCIUM  8.7* 8.3*   GFR: Estimated Creatinine Clearance: 81.7 mL/min (A) (by C-G formula based on SCr of 1.33 mg/dL (H)). Liver Function Tests: Recent Labs  Lab 12/06/23 1940  AST 63*  ALT 77*  ALKPHOS 70  BILITOT 0.4  PROT 7.2  ALBUMIN 3.8   No results for input(s): "LIPASE", "AMYLASE" in the last 168 hours. No results for input(s): "AMMONIA" in the last 168 hours. Coagulation Profile: Recent Labs  Lab 12/06/23 2358  INR 1.3*   Cardiac Enzymes: Recent Labs  Lab 12/06/23 2358  CKTOTAL 75   BNP (last 3 results) No results for input(s): "PROBNP" in the last 8760 hours. HbA1C: No results for input(s): "HGBA1C" in the last 72 hours. CBG: Recent Labs  Lab 12/06/23 2355  GLUCAP 98   Lipid Profile: No results for input(s): "CHOL", "HDL", "LDLCALC", "TRIG", "CHOLHDL", "LDLDIRECT" in the last 72 hours. Thyroid  Function Tests: Recent Labs    12/06/23 2358  TSH 1.525  FREET4 0.51*   Anemia Panel: Recent Labs    12/06/23 2358  FOLATE 6.2  FERRITIN 133  TIBC 231*  IRON 70  RETICCTPCT 1.3   Urine analysis:    Component Value Date/Time   COLORURINE YELLOW (A) 12/06/2023 2231   APPEARANCEUR CLOUDY (A) 12/06/2023 2231   APPEARANCEUR Hazy (A) 03/05/2021 1156   LABSPEC 1.015 12/06/2023 2231   LABSPEC 1.010 12/14/2013 0959   PHURINE 5.0 12/06/2023 2231   GLUCOSEU NEGATIVE 12/06/2023 2231   GLUCOSEU Negative 12/14/2013 0959   HGBUR LARGE (A) 12/06/2023 2231   BILIRUBINUR NEGATIVE 12/06/2023 2231   BILIRUBINUR Negative 03/05/2021 1156   BILIRUBINUR Negative 12/14/2013 0959   KETONESUR NEGATIVE 12/06/2023 2231   PROTEINUR 30 (A) 12/06/2023  2231  UROBILINOGEN 1.0 12/22/2013 1648   NITRITE POSITIVE (A) 12/06/2023 2231   LEUKOCYTESUR LARGE (A) 12/06/2023 2231   LEUKOCYTESUR Negative 12/14/2013 0959   Sepsis Labs: @LABRCNTIP (procalcitonin:4,lacticidven:4) )No results found for this or any previous visit (from the past 240 hours).   Radiological Exams on Admission:   Assessment/Plan Principal Problem:   Severe sepsis (HCC) Active Problems:   UTI (urinary tract infection)   GI bleeding   Pancytopenia (HCC)   Hypernatremia   Thrombocytopenia (HCC)   Hypertension associated with diabetes (HCC)   Hyperlipidemia associated with type 2 diabetes mellitus (HCC)   Abnormal LFTs   Type 2 diabetes mellitus with diabetic polyneuropathy (HCC)   Chronic diastolic CHF (congestive heart failure) (HCC)   History of stroke   Chronic communicating hydrocephalus (HCC)   BPH (benign prostatic hyperplasia)   Fall at home, initial encounter   Depression   Hypothermia   Assessment and Plan:  Severe sepsis due to UTI (urinary tract infection):  Sepsis due to UTI. Pt meets criteria for severe sepsis with WBC <3.5 (which is < 4.0), hypothermia with temperature 91.1.  Lactic acid 3.1 --> 3.0.  Blood pressure is soft, but hemodynamically stable.  Pt has hx of positive urine culture for Acinetobacter Baumanni which is intermittently susceptible to ceftriaxone, will use cefepime today.  - will admit to PCU as inpatient - IV cefepime (patient received 1 dose of vancomycin in ED) - Follow-up of blood culture and urine culture - IV fluid: 1 L LR and 1 L of normal saline, then 75 cc/h of 1/2 NS - trend lactic acid  GI bleeding and pancytopenia (HCC): Hgb 14.1 --> 11.8,  then done to 10.5.  Patient has intermittent dark stool for about a month. Pt is taking ASA and Plavix .  - Hold ASA and Plavix  - Epic message is scheduled to send to Dr. Mamie Searles of GI for consult at 6 AM - NPO after MN, pending GI consult.  - IVF: as above - Start IV  pantoprazole 40 mg bid - Check anemia panel - Zofran  IV for nausea - Avoid NSAIDs and SQ heparin - Maintain IV access (2 large bore IVs if possible). - Monitor closely and follow q6h cbc, transfuse as necessary, if Hgb<7.0 - LaB: INR, PTT and type screen  Thrombocytopenia: Platelets 118 -Check LDH - Peripheral smear  Hypernatremia: Na 150 -IV fluid: 1 L LR and 1 L of normal saline, then 75 cc/h of 1/2 NS -BMP q8h  Hypertension associated with diabetes (HCC): Blood pressure is soft -Hold amlodipine , Coreg , lisinopril  - IV hydralazine  as needed  Hyperlipidemia associated with type 2 diabetes mellitus (HCC) -Lipitor   Abnormal LFTs: Mild.  ALP 70, AST 63, ALT 77, total bilirubin 0.4.  Possibly due to severe sepsis. - Judiciously use low-dose Tylenol  325 mg as needed (patient cannot use NSAIDs due to GI bleeding) - Check hepatitis panel  Type 2 diabetes mellitus with diabetic polyneuropathy (HCC): Recent A1c 5.5, well-controlled.  Patient is taking metformin  - SSI  Chronic diastolic CHF (congestive heart failure) (HCC): 2D echo on 09/05/2020 showed EF of 50-55% with grade 2 diastolic dysfunction.  Patient has 1+ leg edema, no SOB, no oxygen  desaturation, does not seem to have CHF exacerbation. -Check BMP  History of stroke -Hold aspirin , Plavix  - Lipitor   Chronic communicating hydrocephalus [facility]): Patient seems to have mild cognitive impairment. -Donepezil   BPH (benign prostatic hyperplasia) -Flomax , oxybutynin   Fall at home, initial encounter -Fall precaution - PT/OT - Follow-up CT of head  Depression -  Celexa   Hypothermia - Bair hugger - Check TSH and free T4 level - check random cortisol level   DVT ppx: SCD  Code Status: Full code   Family Communication:     not done, no family member is at bed side.     Disposition Plan:  Anticipate discharge back to previous environment  Consults called:  Epic message is scheduled to send to Dr. Mamie Searles of GI for consult  at 6 AM  Admission status and Level of care: Progressive:  as inpt        Dispo: The patient is from: Home              Anticipated d/c is to: Home              Anticipated d/c date is: 2 days              Patient currently is not medically stable to d/c.    Severity of Illness:  The appropriate patient status for this patient is INPATIENT. Inpatient status is judged to be reasonable and necessary in order to provide the required intensity of service to ensure the patient's safety. The patient's presenting symptoms, physical exam findings, and initial radiographic and laboratory data in the context of their chronic comorbidities is felt to place them at high risk for further clinical deterioration. Furthermore, it is not anticipated that the patient will be medically stable for discharge from the hospital within 2 midnights of admission.   * I certify that at the point of admission it is my clinical judgment that the patient will require inpatient hospital care spanning beyond 2 midnights from the point of admission due to high intensity of service, high risk for further deterioration and high frequency of surveillance required.*       Date of Service 12/07/2023    Fidencio Hue [facility]   If 7PM-7AM, please contact night-coverage [email or web address] 12/07/2023, 1:45 AM

## 2023-12-06 NOTE — ED Triage Notes (Signed)
 Pt reports he fell at home, reports pain to right knee. Pt has hx of Stroke with right side deficit. Pt uses a cane to walk. Pt denies  hitting his head. Pt talks in slow soft speech. No respiratory distress noted

## 2023-12-06 NOTE — ED Notes (Signed)
 Bair Hugger applied

## 2023-12-07 ENCOUNTER — Inpatient Hospital Stay

## 2023-12-07 DIAGNOSIS — R6520 Severe sepsis without septic shock: Secondary | ICD-10-CM | POA: Diagnosis present

## 2023-12-07 DIAGNOSIS — N390 Urinary tract infection, site not specified: Secondary | ICD-10-CM | POA: Diagnosis present

## 2023-12-07 DIAGNOSIS — A419 Sepsis, unspecified organism: Secondary | ICD-10-CM | POA: Diagnosis present

## 2023-12-07 DIAGNOSIS — D696 Thrombocytopenia, unspecified: Secondary | ICD-10-CM | POA: Diagnosis present

## 2023-12-07 DIAGNOSIS — K922 Gastrointestinal hemorrhage, unspecified: Secondary | ICD-10-CM | POA: Diagnosis present

## 2023-12-07 LAB — HEPATITIS PANEL, ACUTE
HCV Ab: NONREACTIVE
Hep A IgM: NONREACTIVE
Hep B C IgM: NONREACTIVE
Hepatitis B Surface Ag: NONREACTIVE

## 2023-12-07 LAB — CBC
HCT: 34.7 % — ABNORMAL LOW (ref 39.0–52.0)
HCT: 34.9 % — ABNORMAL LOW (ref 39.0–52.0)
HCT: 36.1 % — ABNORMAL LOW (ref 39.0–52.0)
Hemoglobin: 10.5 g/dL — ABNORMAL LOW (ref 13.0–17.0)
Hemoglobin: 10.9 g/dL — ABNORMAL LOW (ref 13.0–17.0)
Hemoglobin: 11.1 g/dL — ABNORMAL LOW (ref 13.0–17.0)
MCH: 29 pg (ref 26.0–34.0)
MCH: 29.2 pg (ref 26.0–34.0)
MCH: 29.5 pg (ref 26.0–34.0)
MCHC: 30.3 g/dL (ref 30.0–36.0)
MCHC: 30.7 g/dL (ref 30.0–36.0)
MCHC: 31.2 g/dL (ref 30.0–36.0)
MCV: 94.3 fL (ref 80.0–100.0)
MCV: 94.3 fL (ref 80.0–100.0)
MCV: 96.4 fL (ref 80.0–100.0)
Platelets: 103 10*3/uL — ABNORMAL LOW (ref 150–400)
Platelets: 104 10*3/uL — ABNORMAL LOW (ref 150–400)
Platelets: 104 10*3/uL — ABNORMAL LOW (ref 150–400)
RBC: 3.6 MIL/uL — ABNORMAL LOW (ref 4.22–5.81)
RBC: 3.7 MIL/uL — ABNORMAL LOW (ref 4.22–5.81)
RBC: 3.83 MIL/uL — ABNORMAL LOW (ref 4.22–5.81)
RDW: 14.9 % (ref 11.5–15.5)
RDW: 15.1 % (ref 11.5–15.5)
RDW: 15.1 % (ref 11.5–15.5)
WBC: 2.7 10*3/uL — ABNORMAL LOW (ref 4.0–10.5)
WBC: 2.9 10*3/uL — ABNORMAL LOW (ref 4.0–10.5)
WBC: 3 10*3/uL — ABNORMAL LOW (ref 4.0–10.5)
nRBC: 0.7 % — ABNORMAL HIGH (ref 0.0–0.2)
nRBC: 0.7 % — ABNORMAL HIGH (ref 0.0–0.2)
nRBC: 1.4 % — ABNORMAL HIGH (ref 0.0–0.2)

## 2023-12-07 LAB — LACTIC ACID, PLASMA
Lactic Acid, Venous: 3.3 mmol/L (ref 0.5–1.9)
Lactic Acid, Venous: 0.9 mmol/L (ref 0.5–1.9)
Lactic Acid, Venous: 0.9 mmol/L (ref 0.5–1.9)

## 2023-12-07 LAB — BASIC METABOLIC PANEL WITH GFR
Anion gap: 10 (ref 5–15)
Anion gap: 7 (ref 5–15)
BUN: 30 mg/dL — ABNORMAL HIGH (ref 6–20)
BUN: 35 mg/dL — ABNORMAL HIGH (ref 6–20)
CO2: 24 mmol/L (ref 22–32)
CO2: 28 mmol/L (ref 22–32)
Calcium: 8.3 mg/dL — ABNORMAL LOW (ref 8.9–10.3)
Calcium: 8.3 mg/dL — ABNORMAL LOW (ref 8.9–10.3)
Chloride: 114 mmol/L — ABNORMAL HIGH (ref 98–111)
Chloride: 116 mmol/L — ABNORMAL HIGH (ref 98–111)
Creatinine, Ser: 1.32 mg/dL — ABNORMAL HIGH (ref 0.61–1.24)
Creatinine, Ser: 1.33 mg/dL — ABNORMAL HIGH (ref 0.61–1.24)
GFR, Estimated: >60 mL/min (ref >60)
GFR, Estimated: >60 mL/min (ref >60)
Glucose, Bld: 71 mg/dL (ref 70–99)
Glucose, Bld: 81 mg/dL (ref 70–99)
Potassium: 3.9 mmol/L (ref 3.5–5.1)
Potassium: 4 mmol/L (ref 3.5–5.1)
Sodium: 149 mmol/L — ABNORMAL HIGH (ref 135–145)
Sodium: 150 mmol/L — ABNORMAL HIGH (ref 135–145)

## 2023-12-07 LAB — TECHNOLOGIST SMEAR REVIEW: Plt Morphology: NORMAL

## 2023-12-07 LAB — APTT: aPTT: 34 s (ref 24–36)

## 2023-12-07 LAB — URINALYSIS, W/ REFLEX TO CULTURE (INFECTION SUSPECTED)
Bilirubin Urine: NEGATIVE
Glucose, UA: NEGATIVE mg/dL
Ketones, ur: NEGATIVE mg/dL
Nitrite: POSITIVE — AB
Protein, ur: 30 mg/dL — AB
RBC / HPF: >50 RBC/hpf (ref 0–5)
Specific Gravity, Urine: 1.015 (ref 1.005–1.030)
Squamous Epithelial / HPF: 0 /HPF (ref 0–5)
WBC, UA: >50 WBC/hpf (ref 0–5)
pH: 5 (ref 5.0–8.0)

## 2023-12-07 LAB — GLUCOSE, CAPILLARY
Glucose-Capillary: 70 mg/dL (ref 70–99)
Glucose-Capillary: 81 mg/dL (ref 70–99)
Glucose-Capillary: 140 mg/dL — ABNORMAL HIGH (ref 70–99)
Glucose-Capillary: 144 mg/dL — ABNORMAL HIGH (ref 70–99)

## 2023-12-07 LAB — TYPE AND SCREEN
ABO/RH(D): O POS
Antibody Screen: NEGATIVE

## 2023-12-07 LAB — TSH: TSH: 1.525 u[IU]/mL (ref 0.350–4.500)

## 2023-12-07 LAB — IRON AND TIBC
Iron: 70 ug/dL (ref 45–182)
Saturation Ratios: 30 % (ref 17.9–39.5)
TIBC: 231 ug/dL — ABNORMAL LOW (ref 250–450)
UIBC: 161 ug/dL

## 2023-12-07 LAB — PROTIME-INR
INR: 1.3 — ABNORMAL HIGH (ref 0.8–1.2)
Prothrombin Time: 16.2 s — ABNORMAL HIGH (ref 11.4–15.2)

## 2023-12-07 LAB — RETICULOCYTES
Immature Retic Fract: 16.7 % — ABNORMAL HIGH (ref 2.3–15.9)
RBC.: 3.64 MIL/uL — ABNORMAL LOW (ref 4.22–5.81)
Retic Count, Absolute: 47.7 10*3/uL (ref 19.0–186.0)
Retic Ct Pct: 1.3 % (ref 0.4–3.1)

## 2023-12-07 LAB — T4, FREE: Free T4: 0.51 ng/dL — ABNORMAL LOW (ref 0.61–1.12)

## 2023-12-07 LAB — CORTISOL: Cortisol, Plasma: 2.7 ug/dL

## 2023-12-07 LAB — FERRITIN: Ferritin: 133 ng/mL (ref 24–336)

## 2023-12-07 LAB — VITAMIN B12: Vitamin B-12: 1390 pg/mL — ABNORMAL HIGH (ref 180–914)

## 2023-12-07 LAB — PROCALCITONIN: Procalcitonin: <0.1 ng/mL

## 2023-12-07 LAB — HIV ANTIBODY (ROUTINE TESTING W REFLEX): HIV Screen 4th Generation wRfx: NONREACTIVE

## 2023-12-07 LAB — BRAIN NATRIURETIC PEPTIDE: B Natriuretic Peptide: 40.9 pg/mL (ref 0.0–100.0)

## 2023-12-07 LAB — CK: Total CK: 75 U/L (ref 49–397)

## 2023-12-07 LAB — LACTATE DEHYDROGENASE: LDH: 128 U/L (ref 98–192)

## 2023-12-07 LAB — FOLATE: Folate: 6.2 ng/mL (ref >5.9)

## 2023-12-07 MED ORDER — OXYBUTYNIN CHLORIDE ER 10 MG PO TB24
10.0000 mg | ORAL_TABLET | Freq: Every day | ORAL | Status: DC
  Administered 2023-12-07 – 2023-12-11 (×4): 10 mg via ORAL
  Filled 2023-12-07 (×5): qty 1

## 2023-12-07 MED ORDER — CITALOPRAM HYDROBROMIDE 20 MG PO TABS
10.0000 mg | ORAL_TABLET | Freq: Every day | ORAL | Status: DC
  Administered 2023-12-07 – 2023-12-11 (×4): 10 mg via ORAL
  Filled 2023-12-07 (×5): qty 1

## 2023-12-07 MED ORDER — TAMSULOSIN HCL 0.4 MG PO CAPS
0.4000 mg | ORAL_CAPSULE | Freq: Every day | ORAL | Status: DC
  Administered 2023-12-07 – 2023-12-10 (×4): 0.4 mg via ORAL
  Filled 2023-12-07 (×4): qty 1

## 2023-12-07 MED ORDER — GABAPENTIN 300 MG PO CAPS
300.0000 mg | ORAL_CAPSULE | Freq: Three times a day (TID) | ORAL | Status: DC
  Administered 2023-12-07 – 2023-12-11 (×11): 300 mg via ORAL
  Filled 2023-12-07 (×13): qty 1

## 2023-12-07 MED ORDER — PANTOPRAZOLE SODIUM 40 MG IV SOLR
40.0000 mg | Freq: Two times a day (BID) | INTRAVENOUS | Status: DC
  Administered 2023-12-07 – 2023-12-09 (×6): 40 mg via INTRAVENOUS
  Filled 2023-12-07 (×6): qty 10

## 2023-12-07 MED ORDER — ADULT MULTIVITAMIN W/MINERALS CH
1.0000 | ORAL_TABLET | Freq: Every day | ORAL | Status: DC
  Administered 2023-12-07 – 2023-12-11 (×4): 1 via ORAL
  Filled 2023-12-07 (×5): qty 1

## 2023-12-07 MED ORDER — ATORVASTATIN CALCIUM 80 MG PO TABS
80.0000 mg | ORAL_TABLET | Freq: Every day | ORAL | Status: DC
  Administered 2023-12-07 – 2023-12-11 (×4): 80 mg via ORAL
  Filled 2023-12-07 (×5): qty 1

## 2023-12-07 MED ORDER — OXYCODONE HCL 5 MG PO TABS: 5.0000 mg | ORAL_TABLET | Freq: Four times a day (QID) | ORAL | Status: DC | PRN

## 2023-12-07 MED ORDER — SODIUM CHLORIDE 0.9 % IV SOLN
2.0000 g | Freq: Three times a day (TID) | INTRAVENOUS | Status: DC
  Administered 2023-12-07 – 2023-12-09 (×7): 2 g via INTRAVENOUS
  Filled 2023-12-07 (×8): qty 12.5

## 2023-12-07 MED ORDER — IBUPROFEN 400 MG PO TABS: 200.0000 mg | ORAL_TABLET | Freq: Four times a day (QID) | ORAL | Status: DC | PRN

## 2023-12-07 MED ORDER — DEXTROSE 5 % IV SOLN: INTRAVENOUS | Status: DC

## 2023-12-07 MED ORDER — DONEPEZIL HCL 5 MG PO TABS
5.0000 mg | ORAL_TABLET | Freq: Every day | ORAL | Status: DC
  Administered 2023-12-07 – 2023-12-10 (×3): 5 mg via ORAL
  Filled 2023-12-07 (×5): qty 1

## 2023-12-07 MED ORDER — ACETAMINOPHEN 325 MG PO TABS: 325.0000 mg | ORAL_TABLET | Freq: Four times a day (QID) | ORAL | Status: DC | PRN

## 2023-12-07 MED ORDER — VITAMIN B-12 1000 MCG PO TABS
1000.0000 ug | ORAL_TABLET | Freq: Every day | ORAL | Status: DC
  Administered 2023-12-07 – 2023-12-11 (×4): 1000 ug via ORAL
  Filled 2023-12-07 (×5): qty 1

## 2023-12-07 NOTE — Consult Note (Signed)
 Inpatient Consultation   Patient ID: [name] is a 53 y.o. male.  Requesting Provider: Fidencio Hue, MD  Date of Admission: 12/06/2023  Date of Consult: 12/07/23   Reason for Consultation: anemia, possible melena   Patient's Chief Complaint:   Chief Complaint  Patient presents with   Fall   Altered Mental Status    53 year old African-American male with history of stroke on chronic Plavix , ventriculomegaly, possible dementia, HFpEF, who presented to the hospital with fall and altered mental status.  GI is consulted for possible melena and anemia.  Patient was found to have a UTI and is currently being treated for this.  Noted on his labs fluid decrease in hemoglobin from 14 down to 11.  Per patient report he may have had some intermittent dark stools last month.  He has not noticed anything recently.  He denies any hematochezia abdominal pain nausea vomiting hematemesis coffee-ground emesis appetite or weight changes.  No dysphagia or odynophagia.  Denies any reflux symptoms and does not take any PPI.  Last took his Plavix  on 5/31.  He denies any NSAID use.  Currently on Protonix here in the hospital. Iron studies B12 and folate all within normal limits BUN/creatinine elevated at 30/1.32 however some of this may be impacted by his UTI  Denies NSAIDs, Anti-plt agents, and anticoagulants Denies family history of gastrointestinal disease and malignancy Previous Endoscopies: Colonoscopy in 2021 was normal   Past Medical History:  Diagnosis Date   Depression    Diabetes mellitus without complication (HCC)    Diastolic dysfunction    a. 09/2020 Echo: EF 50-55%, no rwma, GrII DD. Nl RV size/fxn.  Mildly dil LA.   Hydrocephalus in adult [facility])    Hyperlipidemia    Hypertension    Seizures (HCC)    Stroke [facility])     Past Surgical History:  Procedure Laterality Date   COLONOSCOPY WITH PROPOFOL  N/A 03/08/2020   Procedure: COLONOSCOPY WITH PROPOFOL ;  Surgeon: Irby Mannan, MD;  Location: [facility] ENDOSCOPY;  Service: Endoscopy;  Laterality: N/A;   LOOP RECORDER IMPLANT  12/20/13   MDT LinQ implanted by Dr Carolynne Citron for cryptogenic stroke   LOOP RECORDER IMPLANT N/A 12/20/2013   Procedure: LOOP RECORDER IMPLANT;  Surgeon: Tammie Fall, MD;  Location: [facility] CATH LAB;  Service: Cardiovascular;  Laterality: N/A;    No Known Allergies  Family History  Problem Relation Age of Onset   Hypertension Mother    Diabetes Mother    Stroke Father    Heart disease Father    Hypertension Father    Stroke Brother    Arthritis Brother    Diabetes Maternal Grandfather     Social History   Tobacco Use   Smoking status: Former   Smokeless tobacco: Never   Tobacco comments:    > 4 years quit  Vaping Use   Vaping status: Never Used  Substance Use Topics   Alcohol use: Yes    Alcohol/week: 2.0 standard drinks of alcohol    Types: 2 Standard drinks or equivalent per week    Comment: in a week   Drug use: No     Pertinent GI related history and allergies were reviewed with the patient  Review of Systems  Constitutional:  Negative for activity change, appetite change, chills, diaphoresis, fatigue, fever and unexpected weight change.  HENT:  Negative for trouble swallowing and voice change.   Respiratory:  Negative for shortness of breath and wheezing.   Cardiovascular:  Negative for chest pain, palpitations and leg swelling.  Gastrointestinal:  Negative for abdominal distention, abdominal pain, anal bleeding, blood in stool, constipation, diarrhea, nausea and vomiting.       Potential melena last month  Musculoskeletal:  Negative for arthralgias and myalgias.  Skin:  Negative for color change and pallor.  Neurological:  Positive for weakness. Negative for dizziness and syncope.  Psychiatric/Behavioral:  Positive for confusion. The patient is not nervous/anxious.   All other systems reviewed and are negative.    Medications Home Medications No current  facility-administered medications on file prior to encounter.   Current Outpatient Medications on File Prior to Encounter  Medication Sig Dispense Refill   acetaminophen  (TYLENOL ) 325 MG tablet Take 2 tablets (650 mg total) by mouth every 6 (six) hours as needed for mild pain (pain score 1-3). 30 tablet 0   amLODipine  (NORVASC ) 10 MG tablet Take 1 tablet (10 mg total) by mouth daily. 90 tablet 1   aspirin  EC (ASPIR-LOW) 81 MG tablet Take 1 tablet (81 mg total) by mouth daily. Swallow whole. 90 tablet 1   atorvastatin  (LIPITOR ) 80 MG tablet TAKE 1 TABLET BY MOUTH ONCE DAILY AT 6:00PM 90 tablet 1   carvedilol  (COREG ) 6.25 MG tablet Take 1 tablet (6.25 mg total) by mouth 2 (two) times daily with a meal. 180 tablet 1   citalopram  (CELEXA ) 10 MG tablet Take 1 tablet (10 mg total) by mouth daily. 90 tablet 1   clopidogrel  (PLAVIX ) 75 MG tablet TAKE 1 TABLET BY MOUTH ONCE EVERY MORNING WITH BREAKFAST 90 tablet 1   cyanocobalamin (VITAMIN B12) 1000 MCG tablet Take 1 tablet (1,000 mcg total) by mouth daily. 90 tablet 1   donepezil  (ARICEPT ) 5 MG tablet Take 1 tablet (5 mg total) by mouth daily. 90 tablet 1   gabapentin  (NEURONTIN ) 300 MG capsule Take 1 capsule (300 mg total) by mouth 3 (three) times daily. 270 capsule 1   lisinopril  (ZESTRIL ) 5 MG tablet Take 1 tablet (5 mg total) by mouth daily. 90 tablet 1   metFORMIN  (GLUCOPHAGE -XR) 500 MG 24 hr tablet Take 1 tablet (500 mg total) by mouth 2 (two) times daily with a meal. 180 tablet 1   Multiple Vitamin (MULTIVITAMIN) tablet Take 1 tablet by mouth daily.     oxybutynin  (DITROPAN  XL) 10 MG 24 hr tablet Take 1 tablet (10 mg total) by mouth at bedtime. 90 tablet 1   tamsulosin  (FLOMAX ) 0.4 MG CAPS capsule Take 1 capsule (0.4 mg total) by mouth daily after supper. 90 capsule 1   Accu-Chek Softclix Lancets lancets USE 2-3 TIMES A DAY AS DIRECTED 100 each 12   Blood Glucose Monitoring Suppl (CONTOUR NEXT MONITOR) w/Device KIT Use to check blood sugar 3 times  daily, fasting in morning with goal <130 and 2 hours after meals with goal <180.  Bring blood sugar log to visits.  Dx. E11.42 1 kit 0   Blood Pressure Monitoring (BLOOD PRESSURE MONITOR AUTOMAT) DEVI Use to monitor blood pressure once daily. 1 each 1   glucose blood (ACCU-CHEK GUIDE TEST) test strip Use as instructed 100 each 12   Pertinent GI related medications were reviewed with the patient  Inpatient Medications  Current Facility-Administered Medications:    0.45 % sodium chloride  infusion, , Intravenous, Continuous, Niu, Xilin, MD, Last Rate: 75 mL/hr at 12/07/23 0043, 75 mL/hr at 12/07/23 0043   acetaminophen  (TYLENOL ) tablet 325 mg, 325 mg, Oral, Q6H PRN, Niu, Xilin, MD   atorvastatin  (LIPITOR ) tablet 80 mg,  80 mg, Oral, Daily, Niu, Xilin, MD   ceFEPIme (MAXIPIME) 2 g in sodium chloride  0.9 % 100 mL IVPB, 2 g, Intravenous, Q8H, Belue, Rondell Code, RPH, Last Rate: 200 mL/hr at 12/07/23 0603, 2 g at 12/07/23 0981   citalopram  (CELEXA ) tablet 10 mg, 10 mg, Oral, Daily, Niu, Xilin, MD   cyanocobalamin (VITAMIN B12) tablet 1,000 mcg, 1,000 mcg, Oral, Daily, Niu, Xilin, MD   donepezil  (ARICEPT ) tablet 5 mg, 5 mg, Oral, Daily, Niu, Xilin, MD   gabapentin  (NEURONTIN ) capsule 300 mg, 300 mg, Oral, TID, Niu, Xilin, MD   hydrALAZINE  (APRESOLINE ) injection 5 mg, 5 mg, Intravenous, Q2H PRN, Niu, Xilin, MD   insulin  aspart (novoLOG ) injection 0-5 Units, 0-5 Units, Subcutaneous, QHS, Niu, Xilin, MD   insulin  aspart (novoLOG ) injection 0-9 Units, 0-9 Units, Subcutaneous, TID WC, Niu, Xilin, MD   multivitamin with minerals tablet 1 tablet, 1 tablet, Oral, Daily, Niu, Xilin, MD   ondansetron  (ZOFRAN ) injection 4 mg, 4 mg, Intravenous, Q8H PRN, Niu, Xilin, MD   oxybutynin  (DITROPAN -XL) 24 hr tablet 10 mg, 10 mg, Oral, Daily, Niu, Xilin, MD   oxyCODONE (Oxy IR/ROXICODONE) immediate release tablet 5 mg, 5 mg, Oral, Q6H PRN, Niu, Xilin, MD   pantoprazole (PROTONIX) injection 40 mg, 40 mg, Intravenous, Q12H,  Niu, Xilin, MD, 40 mg at 12/07/23 0023   tamsulosin  (FLOMAX ) capsule 0.4 mg, 0.4 mg, Oral, QPC supper, Niu, Xilin, MD  sodium chloride  75 mL/hr (12/07/23 0043)   ceFEPime (MAXIPIME) IV 2 g (12/07/23 0603)    acetaminophen , hydrALAZINE , ondansetron  (ZOFRAN ) IV, oxyCODONE   Objective   Vitals:   12/07/23 0216 12/07/23 0230 12/07/23 0425 12/07/23 0600  BP:  (!) 119/95 120/87   Pulse:  70 67   Resp:  20 17   Temp: (!) 93.7 F (34.3 C) (!) 94.3 F (34.6 C) (!) 96 F (35.6 C) (!) 97.3 F (36.3 C)  TempSrc: Oral Oral Axillary Oral  SpO2:  97% 96%   Weight:  105.5 kg    Height:         Physical Exam Vitals and nursing note reviewed.  Constitutional:      General: He is not in acute distress.    Appearance: He is not ill-appearing, toxic-appearing or diaphoretic.  HENT:     Head: Normocephalic and atraumatic.     Nose: Nose normal.     Mouth/Throat:     Mouth: Mucous membranes are moist.     Pharynx: Oropharynx is clear.  Eyes:     General: No scleral icterus.    Extraocular Movements: Extraocular movements intact.  Cardiovascular:     Rate and Rhythm: Normal rate and regular rhythm.     Heart sounds: Normal heart sounds. No murmur heard.    No friction rub. No gallop.  Pulmonary:     Effort: Pulmonary effort is normal. No respiratory distress.     Breath sounds: Normal breath sounds. No wheezing, rhonchi or rales.  Abdominal:     General: Bowel sounds are normal. There is no distension.     Palpations: Abdomen is soft.     Tenderness: There is no abdominal tenderness. There is no guarding or rebound.  Musculoskeletal:     Cervical back: Neck supple.     Right lower leg: No edema.     Left lower leg: No edema.  Skin:    General: Skin is warm and dry.     Coloration: Skin is not jaundiced or pale.  Neurological:  Mental Status: He is alert.     Comments: Answers all questions appropriately. Speech is slow and thought process takes some time, but this appears to be  baseline  Psychiatric:        Behavior: Behavior normal.        Thought Content: Thought content normal.        Judgment: Judgment normal.     Laboratory Data Recent Labs  Lab 12/06/23 1940 12/06/23 2358 12/07/23 0617  WBC 3.5* 2.7* 3.0*  HGB 11.8* 10.5* 10.9*  HCT 38.7* 34.7* 34.9*  PLT 118* 103* 104*   Recent Labs  Lab 12/06/23 1940 12/06/23 2358  NA 150* 150*  K 3.8 3.9  CL 116* 116*  CO2 23 24  BUN 36* 35*  CALCIUM  8.7* 8.3*  PROT 7.2  --   BILITOT 0.4  --   ALKPHOS 70  --   ALT 77*  --   AST 63*  --   GLUCOSE 82 81   Recent Labs  Lab 12/06/23 2358  INR 1.3*    No results for input(s): "LIPASE" in the last 72 hours.      Imaging Studies: CT HEAD WO CONTRAST ( ) Result Date: 12/07/2023 EXAM: CT HEAD WITHOUT TECHNIQUE: CT of the head was performed without the administration of intravenous contrast. Automated exposure control, iterative reconstruction, and/or weight based adjustment of the mA/kV was utilized to reduce the radiation dose to as low as reasonably achievable. 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 Old left basal ganglia lacunar infarct. There is no acute intracranial hemorrhage, mass effect or midline shift. No abnormal extra-axial fluid collection. The gray-white differentiation is maintained without evidence of an acute infarct. Empty sella. ORBITS: The visualized portion of the orbits demonstrate no acute abnormality. SINUSES: The visualized paranasal sinuses and mastoid air cells demonstrate no acute abnormality. SOFT TISSUES AND SKULL: Vascular calcifications. No acute abnormality of the visualized skull or soft tissues. IMPRESSION: 1. No acute findings. 2. 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 3. Old left basal ganglia lacunar infarct. Electronically signed by: Zadie Herter MD 12/07/2023 02:10 AM EDT RP Workstation: [id number]   DG Chest Portable 1 View Result Date: 12/06/2023 CLINICAL DATA:  Weakness.  Patient fell at home. EXAM: PORTABLE CHEST 1 VIEW COMPARISON:  08/31/2023 FINDINGS: Normal heart size and pulmonary vascularity. No focal airspace disease or consolidation in the lungs. No blunting of costophrenic angles. No pneumothorax. Mediastinal contours appear intact. Loop recorder is present. Old appearing right rib fractures. IMPRESSION: No active disease. Electronically Signed   By: Boyce Byes M.D.   On: 12/06/2023 22:22   DG Knee Complete 4 Views Right Result Date: 12/06/2023 CLINICAL DATA:  Right knee pain after a fall. EXAM: RIGHT KNEE - COMPLETE 4+ VIEW COMPARISON:  None Available. FINDINGS: Degenerative changes in the right knee with medial and lateral compartment narrowing and small osteophyte formation. Growth arrest lines in the proximal tibia. No evidence of acute fracture or dislocation. No focal bone lesion or bone destruction. Bone cortex appears intact. No significant effusions. Soft tissues are unremarkable. Vascular calcifications. IMPRESSION: Moderate degenerative changes in the right knee. No acute displaced fractures identified. Electronically Signed   By: Boyce Byes M.D.   On: 12/06/2023 20:10    Assessment:   # Anemia- suspect 2/2 chronic gi loss -  new- April hgb was 14; 10.5 on presentation, now 50 w/o intervention - no current signs of GIB, however, report of melena 1 month ago per hospitalist note and patient - bun cr ratio elevated, however, also in setting of uti (30/1.32)  - chronic plavix  use - denies nsaids - denies pepto, iron use  # Sepsis 2/2 UTI - on abx per primary  # Lactic acidosis- improving  #pancytopenia - plt 104K - no h/o liver disease - inr 1.3  # ventriculomegaly # previous lacunar stroke #  HFpEF  Plan   Full liquid diet starting now Hold plavix  Allow for plavix  washout for 3 days prior to upper endoscopy eval (last dose 5/31) - defer to performing provider for final discretion on timing of endoscopy Protonix 40 mg iv q12 h Hold dvt ppx  Monitor H&H.  Transfusion and resuscitation as per primary team Avoid frequent lab draws to prevent lab induced anemia Supportive care and antiemetics as per primary team Maintain two sites IV access Avoid nsaids Monitor for GIB.  Consider out patient ultrasound to evaluate for liver disease given plt count and inr  Abx as per primary team  Dr. Ole Berkeley will be taking over the GI Service starting tomorrow  Management of other medical comorbidities as per primary team  I personally performed the service.  Thank you for allowing us  to participate in this patient's care. Please don't hesitate to call if any questions or concerns arise.   Quintin Buckle, DO [facility]  Portions of the record may have been created with voice recognition software. Occasional wrong-word or 'sound-a-like' substitutions may have occurred due to the inherent limitations of voice recognition software.  Read the chart carefully and recognize, using context, where substitutions may have occurred.

## 2023-12-07 NOTE — Plan of Care (Signed)
  Problem: Education: Goal: Ability to describe self-care measures that may prevent or decrease complications (Diabetes Survival Skills Education) will improve Outcome: Progressing   Problem: Coping: Goal: Ability to adjust to condition or change in health will improve Outcome: Progressing   Problem: Metabolic: Goal: Ability to maintain appropriate glucose levels will improve Outcome: Progressing   Problem: Nutritional: Goal: Maintenance of adequate nutrition will improve Outcome: Progressing   Problem: Nutrition: Goal: Adequate nutrition will be maintained Outcome: Progressing   Problem: Elimination: Goal: Will not experience complications related to bowel motility Outcome: Progressing

## 2023-12-07 NOTE — Progress Notes (Signed)
 PROGRESS NOTE    [name]  NWG:[id number] DOB: [birth year] DOA: 12/06/2023 PCP: [name], NP    Brief Narrative:  53 y.o. male with medical history significant of stroke, community hydrocephalus, possible mild dementia on donepezil , hypertension, hyperlipidemia, diabetes mellitus, diastolic CHF, depression, BPH, who presents with fall, dark stool, weakness.   Patient states he has been feeling weak recently, and has had multiple falls recently, with last fall happened this morning.  No LOC.  No significant injury.  He complains of right knee pain, which seem to be a chronic issue.  Patient talks slowly, but clear in talking.  Per reported, patient had right-sided weakness from previous stroke, but on my examination, patient's muscle strength in extremities seem to be normal.  No facial droop or slurred speech.  Patient denies chest pain, cough, SOB.  No nausea, vomiting, diarrhea or abdominal pain.  He reports intermittent dark stool for about 1 month.  No fever or chills.  Denies dysuria, hematuria or burning with urination.  Assessment & Plan:   Principal Problem:   Severe sepsis (HCC) Active Problems:   UTI (urinary tract infection)   GI bleeding   Pancytopenia (HCC)   Hypernatremia   Thrombocytopenia (HCC)   Hypertension associated with diabetes (HCC)   Hyperlipidemia associated with type 2 diabetes mellitus (HCC)   Abnormal LFTs   Type 2 diabetes mellitus with diabetic polyneuropathy (HCC)   Chronic diastolic CHF (congestive heart failure) (HCC)   History of stroke   Chronic communicating hydrocephalus (HCC)   BPH (benign prostatic hyperplasia)   Fall at home, initial encounter   Depression   Hypothermia  Severe sepsis due to UTI (urinary tract infection)  Sepsis due to UTI. Pt meets criteria for severe sepsis with WBC <3.5 (which is < 4.0), hypothermia with temperature 91.1.  Lactic acid 3.1 --> 3.0.  Blood pressure is soft, but hemodynamically stable.  Pt has hx  of positive urine culture for Acinetobacter Baumanni which is intermittently susceptible to ceftriaxone, will use cefepime today. Plan: Continue IV cefepime Follow cultures IV fluids Monitor vitals and fever curve   GI bleeding and pancytopenia (HCC):  Hgb 14.1 --> 11.8,  then down to 10.5.   Patient has intermittent dark stool for about a month.  Pt is taking ASA and Plavix . Plan: Hold DAPT aspirin  and Plavix  N.p.o. for now pending GI consultation IV PPI twice daily for now Avoid NSAIDs and VTE prophylaxis  Thrombocytopenia:  Platelets 118   Hypernatremia:  Na 150.  Did not respond to half-normal saline Plan: D5 water x 75 cc/h   Hypertension associated with diabetes [facility]):  Blood pressure is soft - Continue to hold amlodipine , Coreg , lisinopril  - IV hydralazine  as needed   Hyperlipidemia associated with type 2 diabetes mellitus (HCC) -Lipitor    Abnormal LFTs: Mild.  ALP 70, AST 63, ALT 77, total bilirubin 0.4.  Possibly due to severe sepsis. - Judiciously use low-dose Tylenol  325 mg as needed (patient cannot use NSAIDs due to GI bleeding) - Hepatitis panel negative.   Type 2 diabetes mellitus with diabetic polyneuropathy (HCC): Recent A1c 5.5, well-controlled.  Patient is taking metformin  - Hold med [place].  SSI for now   Chronic diastolic CHF (congestive heart failure) (HCC): 2D echo on 09/05/2020 showed EF of 50-55% with grade 2 diastolic dysfunction.  Patient has 1+ leg edema, no SOB, no oxygen  desaturation, does not seem to have CHF exacerbation.  Continue to monitor   History of stroke - Continue to hold  aspirin , Plavix  - Lipitor    Chronic communicating hydrocephalus [facility]): Patient seems to have mild cognitive impairment. - Continue donepezil    BPH (benign prostatic hyperplasia) - Continue Flomax , oxybutynin    Fall at home, initial encounter - Continue fall precaution - PT/OT - Follow-up CT of head   Depression - Continue Celexa     Hypothermia Resolved   DVT prophylaxis: SCD Code Status: Full Family Communication: Brother via phone 6/1 Disposition Plan: Status is: Inpatient Remains inpatient appropriate because: Multiple acute issues as above   Level of care: Progressive  Consultants:  GI  Procedures:  None  Antimicrobials: Cefepime   Subjective: Seen and examined.  Sitting in bed.  No visible distress.  Confused about reason for admission.  Objective: Vitals:   12/07/23 0230 12/07/23 0425 12/07/23 0600 12/07/23 0850  BP: (!) 119/95 120/87  130/84  Pulse: 70 67  66  Resp: 20 17    Temp: (!) 94.3 F (34.6 C) (!) 96 F (35.6 C) (!) 97.3 F (36.3 C) (!) 97.5 F (36.4 C)  TempSrc: Oral Axillary Oral Oral  SpO2: 97% 96%  99%  Weight: 105.5 kg     Height:        Intake/Output Summary (Last 24 hours) at 12/07/2023 1143 Last data filed at 12/07/2023 0653 Gross per 24 hour  Intake 1523.75 ml  Output 400 ml  Net 1123.75 ml   Filed Weights   12/06/23 1935 12/07/23 0230  Weight: 99.8 kg 105.5 kg    Examination:  General exam: Appears calm and comfortable  Respiratory system: Clear to auscultation. Respiratory effort normal. Cardiovascular system: S1-2, RRR, no murmurs, no pedal edema Gastrointestinal system:, NT/ND, normal bowel sounds Central nervous system: Alert.  Oriented x 2.  Evidence of residual CVA.  Speech is dysarthric. Extremities: Symmetric 5 x 5 power. Skin: No rashes, lesions or ulcers Psychiatry: Judgement and insight appear impaired. Mood & affect confused.     Data Reviewed: I have personally reviewed following labs and imaging studies  CBC: Recent Labs  Lab 12/06/23 1940 12/06/23 2358 12/07/23 0617 12/07/23 0928  WBC 3.5* 2.7* 3.0* 2.9*  NEUTROABS 2.0  --   --   --   HGB 11.8* 10.5* 10.9* 11.1*  HCT 38.7* 34.7* 34.9* 36.1*  MCV 95.8 96.4 94.3 94.3  PLT 118* 103* 104* 104*   Basic Metabolic Panel: Recent Labs  Lab 12/06/23 1940 12/06/23 2358  12/07/23 0928  NA 150* 150* 149*  K 3.8 3.9 4.0  CL 116* 116* 114*  CO2 23 24 28   GLUCOSE 82 81 71  BUN 36* 35* 30*  CREATININE 1.31* 1.33* 1.32*  CALCIUM  8.7* 8.3* 8.3*   GFR: Estimated Creatinine Clearance: 84.4 mL/min (A) (by C-G formula based on SCr of 1.32 mg/dL (H)). Liver Function Tests: Recent Labs  Lab 12/06/23 1940  AST 63*  ALT 77*  ALKPHOS 70  BILITOT 0.4  PROT 7.2  ALBUMIN 3.8   No results for input(s): "LIPASE", "AMYLASE" in the last 168 hours. No results for input(s): "AMMONIA" in the last 168 hours. Coagulation Profile: Recent Labs  Lab 12/06/23 2358  INR 1.3*   Cardiac Enzymes: Recent Labs  Lab 12/06/23 2358  CKTOTAL 75   BNP (last 3 results) No results for input(s): "PROBNP" in the last 8760 hours. HbA1C: No results for input(s): "HGBA1C" in the last 72 hours. CBG: Recent Labs  Lab 12/06/23 2355 12/07/23 0847  GLUCAP 98 70   Lipid Profile: No results for input(s): "CHOL", "HDL", "LDLCALC", "TRIG", "CHOLHDL", "LDLDIRECT"  in the last 72 hours. Thyroid  Function Tests: Recent Labs    12/06/23 2358  TSH 1.525  FREET4 0.51*   Anemia Panel: Recent Labs    12/06/23 2358  VITAMINB12 1,390*  FOLATE 6.2  FERRITIN 133  TIBC 231*  IRON 70  RETICCTPCT 1.3   Sepsis Labs: Recent Labs  Lab 12/06/23 2148 12/06/23 2358 12/07/23 0148 12/07/23 0617 12/07/23 0928  PROCALCITON  --  <0.10  --   --   --   LATICACIDVEN 3.0*  --  3.3* 0.9 0.9    Recent Results (from the past 240 hours)  Blood culture (routine x 2)     Status: None (Preliminary result)   Collection Time: 12/06/23  9:52 PM   Specimen: BLOOD LEFT WRIST  Result Value Ref Range Status   Specimen Description BLOOD LEFT WRIST  Final   Special Requests   Final    BOTTLES DRAWN AEROBIC AND ANAEROBIC Blood Culture adequate volume   Culture   Final    NO GROWTH < 12 HOURS Performed at [facility], [address], [place], [place] [address]    Report Status PENDING   Incomplete  Blood culture (routine x 2)     Status: None (Preliminary result)   Collection Time: 12/06/23 10:05 PM   Specimen: BLOOD RIGHT ARM  Result Value Ref Range Status   Specimen Description BLOOD RIGHT ARM  Final   Special Requests   Final    BOTTLES DRAWN AEROBIC AND ANAEROBIC Blood Culture adequate volume   Culture   Final    NO GROWTH < 12 HOURS Performed at [facility], [address] [place], [place] [address]    Report Status PENDING  Incomplete         Radiology Studies: CT HEAD WO CONTRAST ( ) Result Date: 12/07/2023 EXAM: CT HEAD WITHOUT TECHNIQUE: CT of the head was performed without the administration of intravenous contrast. Automated exposure control, iterative reconstruction, and/or weight based adjustment of the mA/kV was utilized to reduce the radiation dose to as low as reasonably achievable. 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 Old left basal ganglia lacunar infarct. There is no acute intracranial hemorrhage, mass effect or midline shift. No abnormal extra-axial fluid collection. The gray-white differentiation is maintained without evidence of an acute infarct. Empty sella. ORBITS: The visualized portion of the orbits demonstrate no acute abnormality. SINUSES: The visualized paranasal sinuses and mastoid air cells demonstrate no acute abnormality. SOFT TISSUES AND SKULL: Vascular calcifications. No acute abnormality of the visualized skull or soft tissues. IMPRESSION: 1. No acute findings. 2. 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 3. Old left basal ganglia lacunar infarct. Electronically signed by: Zadie Herter MD 12/07/2023 02:10 AM EDT RP Workstation: [id number]   DG Chest  Portable 1 View Result Date: 12/06/2023 CLINICAL DATA:  Weakness.  Patient fell at home. EXAM: PORTABLE CHEST 1 VIEW COMPARISON:  08/31/2023 FINDINGS: Normal heart size and pulmonary vascularity. No focal airspace disease or consolidation in the lungs. No blunting of costophrenic angles. No pneumothorax. Mediastinal contours appear intact. Loop recorder is present. Old appearing right rib fractures. IMPRESSION: No active disease. Electronically Signed   By: Boyce Byes M.D.   On: 12/06/2023 22:22   DG Knee Complete 4 Views Right Result Date: 12/06/2023 CLINICAL DATA:  Right knee pain  after a fall. EXAM: RIGHT KNEE - COMPLETE 4+ VIEW COMPARISON:  None Available. FINDINGS: Degenerative changes in the right knee with medial and lateral compartment narrowing and small osteophyte formation. Growth arrest lines in the proximal tibia. No evidence of acute fracture or dislocation. No focal bone lesion or bone destruction. Bone cortex appears intact. No significant effusions. Soft tissues are unremarkable. Vascular calcifications. IMPRESSION: Moderate degenerative changes in the right knee. No acute displaced fractures identified. Electronically Signed   By: Boyce Byes M.D.   On: 12/06/2023 20:10        Scheduled Meds:  atorvastatin   80 mg Oral Daily   citalopram   10 mg Oral Daily   cyanocobalamin  1,000 mcg Oral Daily   donepezil   5 mg Oral Daily   gabapentin   300 mg Oral TID   insulin  aspart  0-5 Units Subcutaneous QHS   insulin  aspart  0-9 Units Subcutaneous TID WC   multivitamin with minerals  1 tablet Oral Daily   oxybutynin   10 mg Oral Daily   pantoprazole (PROTONIX) IV  40 mg Intravenous Q12H   tamsulosin   0.4 mg Oral QPC supper   Continuous Infusions:  ceFEPime (MAXIPIME) IV 2 g (12/07/23 0603)   dextrose  75 mL/hr at 12/07/23 0927     LOS: 1 day  Tiajuana Fluke, MD [facility]   If 7PM-7AM, please contact night-coverage  12/07/2023, 11:43 AM

## 2023-12-07 NOTE — ED Notes (Signed)
 ED TO INPATIENT HANDOFF REPORT  ED Nurse Name and Phone #: [phone]  S Name/Age/Gender [name] 53 y.o. male Room/Bed: ED11A/ED11A  Code Status   Code Status: Full Code  Home/SNF/Other Home Patient oriented to: self, place, time, and situation Is this baseline? Yes   Triage Complete: Triage complete  Chief Complaint SIRS (systemic inflammatory response syndrome) (HCC) [R65.10]  Triage Note Pt reports he fell at home, reports pain to right knee. Pt has hx of Stroke with right side deficit. Pt uses a cane to walk. Pt denies  hitting his head. Pt talks in slow soft speech. No respiratory distress noted    Allergies No Known Allergies  Level of Care/Admitting Diagnosis ED Disposition     ED Disposition  Admit   Condition  --   Comment  Hospital Area: [facility] [[id number]]  Level of Care: Progressive [102]  Admit to Progressive based on following criteria: MULTISYSTEM THREATS such as stable sepsis, metabolic/electrolyte imbalance with or without encephalopathy that is responding to early treatment.  Covid Evaluation: Asymptomatic - no recent exposure (last 10 days) testing not required  Diagnosis: SIRS (systemic inflammatory response syndrome) [facility]) [[id number]]  Admitting Physician: NIU, XILIN [[id number]]  Attending Physician: NIU, XILIN [phone]  Certification:: I certify this patient will need inpatient services for at least 2 midnights  Expected Medical Readiness: 12/08/2023          B Medical/Surgery History Past Medical History:  Diagnosis Date   Depression    Diabetes mellitus without complication (HCC)    Diastolic dysfunction    a. 09/2020 Echo: EF 50-55%, no rwma, GrII DD. Nl RV size/fxn.  Mildly dil LA.   Hydrocephalus in adult [facility])    Hyperlipidemia    Hypertension    Seizures (HCC)    Stroke [facility])    Past Surgical History:  Procedure Laterality Date   COLONOSCOPY WITH PROPOFOL  N/A 03/08/2020   Procedure: COLONOSCOPY WITH PROPOFOL ;   Surgeon: Irby Mannan, MD;  Location: [facility] ENDOSCOPY;  Service: Endoscopy;  Laterality: N/A;   LOOP RECORDER IMPLANT  12/20/13   MDT LinQ implanted by Dr Carolynne Citron for cryptogenic stroke   LOOP RECORDER IMPLANT N/A 12/20/2013   Procedure: LOOP RECORDER IMPLANT;  Surgeon: Tammie Fall, MD;  Location: [facility] CATH LAB;  Service: Cardiovascular;  Laterality: N/A;     A IV Location/Drains/Wounds Patient Lines/Drains/Airways Status     Active Line/Drains/Airways     Name Placement date Placement time Site Days   Peripheral IV 12/06/23 20 G Left Wrist 12/06/23  2203  Wrist  1            Intake/Output Last 24 hours  Intake/Output Summary (Last 24 hours) at 12/07/2023 0151 Last data filed at 12/06/2023 2310 Gross per 24 hour  Intake 1000 ml  Output --  Net 1000 ml    Labs/Imaging Results for orders placed or performed during the hospital encounter of 12/06/23 (from the past 48 hours)  Comprehensive metabolic panel     Status: Abnormal   Collection Time: 12/06/23  7:40 PM  Result Value Ref Range   Sodium 150 (H) 135 - 145 mmol/L   Potassium 3.8 3.5 - 5.1 mmol/L   Chloride 116 (H) 98 - 111 mmol/L   CO2 23 22 - 32 mmol/L   Glucose, Bld 82 70 - 99 mg/dL    Comment: Glucose reference range applies only to samples taken after fasting for at least 8 hours.   BUN 36 (H) 6 -  20 mg/dL   Creatinine, Ser 1.19 (H) 0.61 - 1.24 mg/dL   Calcium  8.7 (L) 8.9 - 10.3 mg/dL   Total Protein 7.2 6.5 - 8.1 g/dL   Albumin 3.8 3.5 - 5.0 g/dL   AST 63 (H) 15 - 41 U/L   ALT 77 (H) 0 - 44 U/L   Alkaline Phosphatase 70 38 - 126 U/L   Total Bilirubin 0.4 0.0 - 1.2 mg/dL   GFR, Estimated >14 >78 mL/min    Comment: (NOTE) Calculated using the CKD-EPI Creatinine Equation (2021)    Anion gap 11 5 - 15    Comment: Performed at [facility], [address] [place], [place] [address]  Lactic acid, plasma     Status: Abnormal   Collection Time: 12/06/23  7:40 PM  Result Value Ref Range   Lactic  Acid, Venous 3.1 (HH) 0.5 - 1.9 mmol/L    Comment: CRITICAL RESULT CALLED TO, READ BACK BY AND VERIFIED WITH HUNTER ORE @ 12/06/23 2017 AB Performed at [facility], [address] [place], [place] [address]   CBC with Differential     Status: Abnormal   Collection Time: 12/06/23  7:40 PM  Result Value Ref Range   WBC 3.5 (L) 4.0 - 10.5 K/uL   RBC 4.04 (L) 4.22 - 5.81 MIL/uL   Hemoglobin 11.8 (L) 13.0 - 17.0 g/dL   HCT 57.8 (L) 46.9 - 62.9 %   MCV 95.8 80.0 - 100.0 fL   MCH 29.2 26.0 - 34.0 pg   MCHC 30.5 30.0 - 36.0 g/dL   RDW 52.8 41.3 - 24.4 %   Platelets 118 (L) 150 - 400 K/uL   nRBC 0.9 (H) 0.0 - 0.2 %   Neutrophils Relative % 57 %   Neutro Abs 2.0 1.7 - 7.7 K/uL   Lymphocytes Relative 30 %   Lymphs Abs 1.0 0.7 - 4.0 K/uL   Monocytes Relative 8 %   Monocytes Absolute 0.3 0.1 - 1.0 K/uL   Eosinophils Relative 5 %   Eosinophils Absolute 0.2 0.0 - 0.5 K/uL   Basophils Relative 0 %   Basophils Absolute 0.0 0.0 - 0.1 K/uL   Immature Granulocytes 0 %   Abs Immature Granulocytes 0.01 0.00 - 0.07 K/uL    Comment: Performed at [facility], [address] [place], [place] [address]  Lactic acid, plasma     Status: Abnormal   Collection Time: 12/06/23  9:48 PM  Result Value Ref Range   Lactic Acid, Venous 3.0 (HH) 0.5 - 1.9 mmol/L    Comment: CRITICAL VALUE NOTED. VALUE IS CONSISTENT WITH PREVIOUSLY REPORTED/CALLED VALUE AB Performed at [facility], [address] [place], [place] [address]   Urinalysis, w/ Reflex to Culture (Infection Suspected) -Urine, Random     Status: Abnormal   Collection Time: 12/06/23 10:31 PM  Result Value Ref Range   Specimen Source URINE, CATHETERIZED    Color, Urine YELLOW (A) YELLOW   APPearance CLOUDY (A) CLEAR   Specific Gravity, Urine 1.015 1.005 - 1.030   pH 5.0 5.0 - 8.0   Glucose, UA NEGATIVE NEGATIVE mg/dL   Hgb urine dipstick LARGE (A) NEGATIVE   Bilirubin Urine NEGATIVE NEGATIVE   Ketones, ur NEGATIVE  NEGATIVE mg/dL   Protein, ur 30 (A) NEGATIVE mg/dL   Nitrite POSITIVE (A) NEGATIVE   Leukocytes,Ua LARGE (A) NEGATIVE   RBC / HPF >50 0 - 5 RBC/hpf   WBC, UA >50 0 - 5 WBC/hpf  Comment:        Reflex urine culture not performed if WBC <=10, OR if Squamous epithelial cells >5. If Squamous epithelial cells >5 suggest recollection.    Bacteria, UA MANY (A) NONE SEEN   Squamous Epithelial / HPF 0 0 - 5 /HPF   WBC Clumps PRESENT     Comment: Performed at [facility], [address] [place], [place] [address]  CBG monitoring, ED     Status: None   Collection Time: 12/06/23 11:55 PM  Result Value Ref Range   Glucose-Capillary 98 70 - 99 mg/dL    Comment: Glucose reference range applies only to samples taken after fasting for at least 8 hours.  Brain natriuretic peptide     Status: None   Collection Time: 12/06/23 11:58 PM  Result Value Ref Range   B Natriuretic Peptide 40.9 0.0 - 100.0 pg/mL    Comment: Performed at [facility], [address] [place], [place] [address]  Basic metabolic panel with GFR     Status: Abnormal   Collection Time: 12/06/23 11:58 PM  Result Value Ref Range   Sodium 150 (H) 135 - 145 mmol/L   Potassium 3.9 3.5 - 5.1 mmol/L   Chloride 116 (H) 98 - 111 mmol/L   CO2 24 22 - 32 mmol/L   Glucose, Bld 81 70 - 99 mg/dL    Comment: Glucose reference range applies only to samples taken after fasting for at least 8 hours.   BUN 35 (H) 6 - 20 mg/dL   Creatinine, Ser 0.98 (H) 0.61 - 1.24 mg/dL   Calcium  8.3 (L) 8.9 - 10.3 mg/dL   GFR, Estimated >11 >91 mL/min    Comment: (NOTE) Calculated using the CKD-EPI Creatinine Equation (2021)    Anion gap 10 5 - 15    Comment: Performed at [facility], [address] [place], [place] [address]  CK     Status: None   Collection Time: 12/06/23 11:58 PM  Result Value Ref Range   Total CK 75 49 - 397 U/L    Comment: Performed at [facility], [address] [place], [place]  [address]  Protime-INR     Status: Abnormal   Collection Time: 12/06/23 11:58 PM  Result Value Ref Range   Prothrombin Time 16.2 (H) 11.4 - 15.2 seconds   INR 1.3 (H) 0.8 - 1.2    Comment: (NOTE) INR goal varies based on device and disease states. Performed at [facility], [address] [place], [place] [address]   APTT     Status: None   Collection Time: 12/06/23 11:58 PM  Result Value Ref Range   aPTT 34 24 - 36 seconds    Comment: Performed at [facility], [address] [place], [place] [address]  Type and screen [facility]     Status: None (Preliminary result)   Collection Time: 12/06/23 11:58 PM  Result Value Ref Range   ABO/RH(D) PENDING    Antibody Screen PENDING    Sample Expiration      12/09/2023,2359 Performed at [facility], [address] [place], [place] [address]   CBC     Status: Abnormal   Collection Time: 12/06/23 11:58 PM  Result Value Ref Range   WBC 2.7 (L) 4.0 - 10.5 K/uL   RBC 3.60 (L) 4.22 - 5.81 MIL/uL   Hemoglobin 10.5 (L) 13.0 - 17.0 g/dL   HCT 41.3 (L) 24.4 - 01.0 %  MCV 96.4 80.0 - 100.0 fL   MCH 29.2 26.0 - 34.0 pg   MCHC 30.3 30.0 - 36.0 g/dL   RDW 16.1 09.6 - 04.5 %   Platelets 103 (L) 150 - 400 K/uL    Comment: REPEATED TO VERIFY   nRBC 0.7 (H) 0.0 - 0.2 %    Comment: Performed at [facility], [address] [place], [place] [address]  Lactate dehydrogenase     Status: None   Collection Time: 12/06/23 11:58 PM  Result Value Ref Range   LDH 128 98 - 192 U/L    Comment: Performed at [facility], [address] [place], [place] [address]  TSH     Status: None   Collection Time: 12/06/23 11:58 PM  Result Value Ref Range   TSH 1.525 0.350 - 4.500 uIU/mL    Comment: Performed by a 3rd Generation assay with a functional sensitivity of <=0.01 uIU/mL. Performed at [facility], [address] [place], [place] [address]   T4, free     Status: Abnormal    Collection Time: 12/06/23 11:58 PM  Result Value Ref Range   Free T4 0.51 (L) 0.61 - 1.12 ng/dL    Comment: (NOTE) Biotin ingestion may interfere with free T4 tests. If the results are inconsistent with the TSH level, previous test results, or the clinical presentation, then consider biotin interference. If needed, order repeat testing after stopping biotin. Performed at [facility], [address] [place], [place] [address]   Folate     Status: None   Collection Time: 12/06/23 11:58 PM  Result Value Ref Range   Folate 6.2 >5.9 ng/mL    Comment: Performed at [facility], [address] [place], [place] [address]  Iron and TIBC     Status: Abnormal   Collection Time: 12/06/23 11:58 PM  Result Value Ref Range   Iron 70 45 - 182 ug/dL   TIBC 696 (L) 295 - 284 ug/dL   Saturation Ratios 30 17.9 - 39.5 %   UIBC 161 ug/dL    Comment: Performed at [facility], [address] [place], [place] [address]  Ferritin     Status: None   Collection Time: 12/06/23 11:58 PM  Result Value Ref Range   Ferritin 133 24 - 336 ng/mL    Comment: Performed at [facility], [address] [place] [address] [place], [place] [address]  Reticulocytes     Status: Abnormal   Collection Time: 12/06/23 11:58 PM  Result Value Ref Range   Retic Ct Pct 1.3 0.4 - 3.1 %   RBC. 3.64 (L) 4.22 - 5.81 MIL/uL   Retic Count, Absolute 47.7 19.0 - 186.0 K/uL   Immature Retic Fract 16.7 (H) 2.3 - 15.9 %    Comment: Performed at [facility], [address] [place], [place] [address]   DG Chest Portable 1 View Result Date: 12/06/2023 CLINICAL DATA:  Weakness.  Patient fell at home. EXAM: PORTABLE CHEST 1 VIEW COMPARISON:  08/31/2023 FINDINGS: Normal heart size and pulmonary vascularity. No focal airspace disease or consolidation in the lungs. No blunting of costophrenic angles. No pneumothorax. Mediastinal contours appear intact. Loop recorder is present. Old appearing right rib  fractures. IMPRESSION: No active disease. Electronically Signed   By: Boyce Byes M.D.   On: 12/06/2023 22:22   DG Knee Complete 4 Views Right Result Date: 12/06/2023 CLINICAL DATA:  Right knee pain after a fall. EXAM: RIGHT KNEE - COMPLETE 4+ VIEW  COMPARISON:  None Available. FINDINGS: Degenerative changes in the right knee with medial and lateral compartment narrowing and small osteophyte formation. Growth arrest lines in the proximal tibia. No evidence of acute fracture or dislocation. No focal bone lesion or bone destruction. Bone cortex appears intact. No significant effusions. Soft tissues are unremarkable. Vascular calcifications. IMPRESSION: Moderate degenerative changes in the right knee. No acute displaced fractures identified. Electronically Signed   By: Boyce Byes M.D.   On: 12/06/2023 20:10    Pending Labs Unresulted Labs (From admission, onward)     Start     Ordered   12/07/23 0200  Basic metabolic panel with GFR  Now then every 8 hours,   TIMED      12/06/23 2337   12/07/23 0200  Lactic acid, plasma  (Lactic Acid)  STAT Now then every 2 hours,   STAT      12/07/23 0115   12/07/23 0105  Procalcitonin  Add-on,   AD       References:    Procalcitonin Lower Respiratory Tract Infection AND Sepsis Procalcitonin Algorithm   12/07/23 0104   12/07/23 0008  Technologist smear review  Once,   AD        12/07/23 0008   12/06/23 2347  HIV Antibody (routine testing w rflx)  (HIV Antibody (Routine testing w reflex) panel)  Once,   R        12/06/23 2348   12/06/23 2343  Cortisol  Add-on,   AD        12/06/23 2342   12/06/23 2343  Vitamin B12  (Anemia Panel (PNL))  Add-on,   AD        12/06/23 2343   12/06/23 2341  CBC  Now then every 6 hours,   TIMED      12/06/23 2340   12/06/23 2335  Hepatitis panel, acute  Add-on,   AD        12/06/23 2334   12/06/23 2231  Urine Culture  Once,   R        12/06/23 2231   12/06/23 2150  Blood culture (routine x 2)  BLOOD CULTURE X 2,    STAT      12/06/23 2149            Vitals/Pain Today's Vitals   12/06/23 1929 12/06/23 1935 12/06/23 2219 12/06/23 2240  BP: 91/72   101/64  Pulse: (!) 56   (!) 52  Resp: 12   20  Temp:   (!) 91.1 F (32.8 C) (!) 91.1 F (32.8 C)  TempSrc:   Rectal Rectal  SpO2: 97%   100%  Weight:  99.8 kg    Height:  6\' 1"  (1.854 m)    PainSc:  10-Worst pain ever  0-No pain    Isolation Precautions No active isolations  Medications Medications  ondansetron  (ZOFRAN ) injection 4 mg (has no administration in time range)  hydrALAZINE  (APRESOLINE ) injection 5 mg (has no administration in time range)  0.45 % sodium chloride  infusion (75 mL/hr Intravenous New Bag/Given 12/07/23 0043)  insulin  aspart (novoLOG ) injection 0-9 Units (has no administration in time range)  insulin  aspart (novoLOG ) injection 0-5 Units ( Subcutaneous Not Given 12/07/23 0000)  pantoprazole (PROTONIX) injection 40 mg (40 mg Intravenous Given 12/07/23 0023)  atorvastatin  (LIPITOR ) tablet 80 mg (has no administration in time range)  citalopram  (CELEXA ) tablet 10 mg (has no administration in time range)  donepezil  (ARICEPT ) tablet 5 mg (has no administration in time range)  oxybutynin  (  DITROPAN -XL) 24 hr tablet 10 mg (has no administration in time range)  tamsulosin  (FLOMAX ) capsule 0.4 mg (has no administration in time range)  cyanocobalamin (VITAMIN B12) tablet 1,000 mcg (has no administration in time range)  gabapentin  (NEURONTIN ) capsule 300 mg (has no administration in time range)  multivitamin with minerals tablet 1 tablet (has no administration in time range)  oxyCODONE (Oxy IR/ROXICODONE) immediate release tablet 5 mg (has no administration in time range)  acetaminophen  (TYLENOL ) tablet 325 mg (has no administration in time range)  sodium chloride  0.9 % bolus 1,000 mL (0 mLs Intravenous Stopped 12/06/23 2310)  vancomycin (VANCOCIN) IVPB 1000 mg/200 mL premix (0 mg Intravenous Stopped 12/07/23 0040)  ceFEPIme (MAXIPIME) 2  g in sodium chloride  0.9 % 100 mL IVPB (0 g Intravenous Stopped 12/06/23 2310)  lactated ringers bolus 1,000 mL (1,000 mLs Intravenous Bolus from Bag 12/06/23 2312)    Mobility walks with device     Focused Assessments    R Recommendations: See Admitting Provider Note  Report given to:   Additional Notes:

## 2023-12-08 DIAGNOSIS — A419 Sepsis, unspecified organism: Secondary | ICD-10-CM | POA: Diagnosis not present

## 2023-12-08 DIAGNOSIS — R6520 Severe sepsis without septic shock: Secondary | ICD-10-CM | POA: Diagnosis not present

## 2023-12-08 LAB — CBC WITH DIFFERENTIAL/PLATELET
Abs Immature Granulocytes: 0.01 10*3/uL (ref 0.00–0.07)
Basophils Absolute: 0 10*3/uL (ref 0.0–0.1)
Basophils Relative: 0 %
Eosinophils Absolute: 0.1 10*3/uL (ref 0.0–0.5)
Eosinophils Relative: 4 %
HCT: 36 % — ABNORMAL LOW (ref 39.0–52.0)
Hemoglobin: 11.3 g/dL — ABNORMAL LOW (ref 13.0–17.0)
Immature Granulocytes: 0 %
Lymphocytes Relative: 30 %
Lymphs Abs: 0.8 10*3/uL (ref 0.7–4.0)
MCH: 29.2 pg (ref 26.0–34.0)
MCHC: 31.4 g/dL (ref 30.0–36.0)
MCV: 93 fL (ref 80.0–100.0)
Monocytes Absolute: 0.3 10*3/uL (ref 0.1–1.0)
Monocytes Relative: 11 %
Neutro Abs: 1.5 10*3/uL — ABNORMAL LOW (ref 1.7–7.7)
Neutrophils Relative %: 55 %
Platelets: 109 10*3/uL — ABNORMAL LOW (ref 150–400)
RBC: 3.87 MIL/uL — ABNORMAL LOW (ref 4.22–5.81)
RDW: 14.8 % (ref 11.5–15.5)
WBC: 2.7 10*3/uL — ABNORMAL LOW (ref 4.0–10.5)
nRBC: 0.7 % — ABNORMAL HIGH (ref 0.0–0.2)

## 2023-12-08 LAB — GLUCOSE, CAPILLARY
Glucose-Capillary: 137 mg/dL — ABNORMAL HIGH (ref 70–99)
Glucose-Capillary: 143 mg/dL — ABNORMAL HIGH (ref 70–99)
Glucose-Capillary: 106 mg/dL — ABNORMAL HIGH (ref 70–99)
Glucose-Capillary: 194 mg/dL — ABNORMAL HIGH (ref 70–99)

## 2023-12-08 LAB — BASIC METABOLIC PANEL WITH GFR
Anion gap: 8 (ref 5–15)
BUN: 22 mg/dL — ABNORMAL HIGH (ref 6–20)
CO2: 27 mmol/L (ref 22–32)
Calcium: 8.3 mg/dL — ABNORMAL LOW (ref 8.9–10.3)
Chloride: 107 mmol/L (ref 98–111)
Creatinine, Ser: 1.34 mg/dL — ABNORMAL HIGH (ref 0.61–1.24)
GFR, Estimated: >60 mL/min (ref >60)
Glucose, Bld: 197 mg/dL — ABNORMAL HIGH (ref 70–99)
Potassium: 4 mmol/L (ref 3.5–5.1)
Sodium: 142 mmol/L (ref 135–145)

## 2023-12-08 NOTE — Evaluation (Signed)
 Occupational Therapy Evaluation Patient Details Name: [name] MRN: [id number] DOB: 1970-09-08 Today's Date: 12/08/2023   History of Present Illness   Pt is a 53 y.o. male presenting to hospital 12/06/23 with c/o falls and AMS; c/o R knee pain after recent fall (but has been able to walk afterwards).  Pt admitted with severe sepsis d/t UTI, GI bleeding and pancytopenia, thrombocytopenia, hypernatremia, htn, abnormal LFT's.  PMH includes stroke, community hydrocephalus, possible mild dementia, htn, HLD, DM, diastolic CHF, depression, BPH, seizures.     Clinical Impressions Pt was seen for OT evaluation this date. PTA, pt resides at home with his brother in a 1 level home with 5 STE. Reports he is IND/MOD I with ADLs via sponge bathing. Uses a cane for indoor mobility and rollator for community mobile. His brother manages IADLs, meal prep, transportation, etc.  Pt presents to acute OT demonstrating impaired ADL performance and functional mobility 2/2 weakness, balance deficits, coordination deficits, and notable word finding difficulties. Pt had a previous CVA with R sided deficits. Pt currently requires Min A x2 to Mod A x1 for STS from recliner during session. He demo seated grooming tasks in recliner with set up assist. He demo LB dressing to don mesh underwear with Min/Mod A, able to manage over feet and up above knees, but once in standing needed full assist to pull up/down over hips d/t his poor standing balance. Pt with notable posterior lean requiring cueing for anterior weight shift and wider BOS. Pt would benefit from skilled OT services to address noted impairments and functional limitations to maximize safety and independence while minimizing falls risk and caregiver burden. Do anticipate the need for follow up OT services upon acute hospital DC.      If plan is discharge home, recommend the following:   A lot of help with walking and/or transfers;Two people to help with walking  and/or transfers;A lot of help with bathing/dressing/bathroom;Help with stairs or ramp for entrance     Functional Status Assessment   Patient has had a recent decline in their functional status and demonstrates the ability to make significant improvements in function in a reasonable and predictable amount of time.     Equipment Recommendations   Other (comment) (defer to next venue)     Recommendations for Other Services         Precautions/Restrictions   Precautions Precautions: Fall Recall of Precautions/Restrictions: Impaired Restrictions Weight Bearing Restrictions Per Provider Order: No     Mobility Bed Mobility               General bed mobility comments: NT up in recliner pre/post session    Transfers Overall transfer level: Needs assistance Equipment used: Rolling walker (2 wheels) Transfers: Sit to/from Stand Sit to Stand: Mod assist           General transfer comment: Mod A for STS from recliner to RW, notable posterior lean in standing requiring constant cueing for anterior weight shift and to maintain balance in standing      Balance Overall balance assessment: Needs assistance Sitting-balance support: No upper extremity supported, Feet supported Sitting balance-Leahy Scale: Fair Sitting balance - Comments: no LOB seated in recliner to don mesh underwear   Standing balance support: Bilateral upper extremity supported, Reliant on assistive device for balance Standing balance-Leahy Scale: Poor Standing balance comment: posterior lean in standing, needs cues for wider BOS and anterior weight shift  ADL either performed or assessed with clinical judgement   ADL Overall ADL's : Needs assistance/impaired     Grooming: Wash/dry face;Oral care;Set up;Sitting Grooming Details (indicate cue type and reason): in recliner             Lower Body Dressing: Minimal assistance;Moderate  assistance;Sitting/lateral leans;Sit to/from stand Lower Body Dressing Details (indicate cue type and reason): pt able to don mesh underwear seated in recliner, however needed assist to pull over hips in standing d/t poor balance                     Vision         Perception         Praxis         Pertinent Vitals/Pain Pain Assessment Pain Assessment: No/denies pain     Extremity/Trunk Assessment Upper Extremity Assessment Upper Extremity Assessment: Overall WFL for tasks assessed (R sided deficits from previous CVA; mild weakness)   Lower Extremity Assessment Lower Extremity Assessment: Generalized weakness RLE Deficits / Details: posterior lean in standing LLE Deficits / Details: hip flexion, knee flexion/extension, and DF 5/5   Cervical / Trunk Assessment Cervical / Trunk Assessment: Normal   Communication Communication Communication: Impaired Factors Affecting Communication: Difficulty expressing self (word finding difficulty)   Cognition Arousal: Alert Behavior During Therapy: Anxious, Impulsive, Flat affect                                 Following commands: Impaired Following commands impaired: Follows multi-step commands inconsistently     Cueing  General Comments   Cueing Techniques: Verbal cues;Visual cues  Pt's gown and towel between legs note to be wet (new gown donned and towel placed in dirty linen hamper).   Exercises Other Exercises Other Exercises: Edu on role of OT in acute setting.   Shoulder Instructions      Home Living Family/patient expects to be discharged to:: Private residence Living Arrangements: Other relatives (Pt's brother) Available Help at Discharge: Family;Available PRN/intermittently Type of Home: House Home Access: Stairs to enter [employer] of Steps: 5 Entrance Stairs-Rails: None Home Layout: One level     Bathroom Shower/Tub: Tub/shower unit;Sponge bathes at baseline   [employer]: Standard     Home Equipment: Architectural technologist (4 wheels)          Prior Functioning/Environment Prior Level of Function : Needs assist             Mobility Comments: Modified independent ambulating with quad cane in home; uses rollator in community.  2 falls in past month (pt tripped and fell forward). ADLs Comments: Brother brings wash basin for bathing, then pt able to perform; brother does IADLs, meal prep, etc    OT Problem List: Decreased strength;Impaired balance (sitting and/or standing);Decreased safety awareness;Decreased coordination   OT Treatment/Interventions: Self-care/ADL training;Therapeutic exercise;Therapeutic activities;Patient/family education;Balance training      OT Goals(Current goals can be found in the care plan section)   Acute Rehab OT Goals Patient Stated Goal: improve balance and strength OT Goal Formulation: With patient Time For Goal Achievement: 12/22/23 Potential to Achieve Goals: Fair ADL Goals Pt Will Perform Lower Body Bathing: with contact guard assist;sit to/from stand;sitting/lateral leans Pt Will Perform Lower Body Dressing: with contact guard assist;sitting/lateral leans;sit to/from stand Pt Will Transfer to Toilet: bedside commode;with min assist;ambulating;regular height toilet Pt Will Perform Toileting - Clothing Manipulation and hygiene: with contact guard  assist;sit to/from stand;sitting/lateral leans;with min assist   OT Frequency:  Min 2X/week    Co-evaluation              AM-PAC OT "6 Clicks" Daily Activity     Outcome Measure Help from another person eating meals?: None Help from another person taking care of personal grooming?: A Little Help from another person toileting, which includes using toliet, bedpan, or urinal?: A Lot Help from another person bathing (including washing, rinsing, drying)?: A Lot Help from another person to put on and taking off regular upper body clothing?: A Little Help from  another person to put on and taking off regular lower body clothing?: A Lot 6 Click Score: 16   End of Session Equipment Utilized During Treatment: Gait belt;Rolling walker (2 wheels) Nurse Communication: Mobility status  Activity Tolerance: Patient tolerated treatment well Patient left: in chair;with call bell/phone within reach;with chair alarm set;with nursing/sitter in room  OT Visit Diagnosis: Other abnormalities of gait and mobility (R26.89);Unsteadiness on feet (R26.81);Muscle weakness (generalized) (M62.81)                Time: 4098-1191 OT Time Calculation (min): 25 min Charges:  OT General Charges $OT Visit: 1 Visit OT Evaluation $OT Eval Moderate Complexity: 1 Mod OT Treatments $Self Care/Home Management : 8-22 mins Niketa Turner, OTR/L 12/08/23, 2:49 PM  Corrie Brannen E Galo Sayed 12/08/2023, 2:46 PM

## 2023-12-08 NOTE — Evaluation (Signed)
 Physical Therapy Evaluation Patient Details Name: [name] MRN: [id number] DOB: 1970-12-30 Today's Date: 12/08/2023  History of Present Illness  Pt is a 53 y.o. male presenting to hospital 12/06/23 with c/o falls and AMS; c/o R knee pain after recent fall (but has been able to walk afterwards).  Pt admitted with severe sepsis d/t UTI, GI bleeding and pancytopenia, thrombocytopenia, hypernatremia, htn, abnormal LFT's.  PMH includes stroke, community hydrocephalus, possible mild dementia, htn, HLD, DM, diastolic CHF, depression, BPH, seizures.  Clinical Impression  Prior to recent medical concerns, pt reports being ambulatory (uses cane in the home and walker outside of home); lives with his brother in 1 level home with 5 STE no railing.  Pt oriented to name, DOB, hospital (with multiple choice), and June (pt reported it was 2020).  Currently pt is min assist semi-supine to sitting EOB; min assist x2 for sit to stand transfers up to RW; and min assist x2 to take 3-4 steps bed to recliner with RW use.  Pt demonstrating posterior lean in dynamic sitting and static/dynamic standing activities requiring cueing and assist to correct.  Pt also noted with generalized ataxic UE/trunk/LE body movements during sessions activities.  Pt c/o dizziness in standing and after sitting (BP 133/90 with MAP 103 in sitting with HR 63 bpm; standing BP 130/84 with MAP 95 with HR 79 bpm); symptoms resolved end of session.  Pt would currently benefit from skilled PT to address noted impairments and functional limitations (see below for any additional details).  Upon hospital discharge, pt would benefit from ongoing therapy.     If plan is discharge home, recommend the following: Two people to help with walking and/or transfers;A lot of help with bathing/dressing/bathroom;Assistance with cooking/housework;Assist for transportation;Help with stairs or ramp for entrance   Can travel by private vehicle   No    Equipment  Recommendations Rolling walker (2 wheels);BSC/3in1  Recommendations for Other Services       Functional Status Assessment Patient has had a recent decline in their functional status and demonstrates the ability to make significant improvements in function in a reasonable and predictable amount of time.     Precautions / Restrictions Precautions Precautions: Fall Recall of Precautions/Restrictions: Impaired Restrictions Weight Bearing Restrictions Per Provider Order: No      Mobility  Bed Mobility Overal bed mobility: Needs Assistance Bed Mobility: Supine to Sit     Supine to sit: Min assist, HOB elevated, Used rails     General bed mobility comments: assist for trunk    Transfers Overall transfer level: Needs assistance Equipment used: Rolling walker (2 wheels) Transfers: Sit to/from Stand Sit to Stand: Min assist, +2 physical assistance           General transfer comment: vc's for UE positioning/pushing off of bed to stand and reaching back to sit; assist to steady when standing; pt standing with weight through his heels requiring cueing to shift his weight forward.  1st trial standing pt leaning and twisting trunk to R but able to correct to midline with cueing.    Ambulation/Gait Ambulation/Gait assistance: Min assist, +2 physical assistance Gait Distance (Feet):  (3-4 steps bed to recliner) Assistive device: Rolling walker (2 wheels) Gait Pattern/deviations: Decreased step length - right, Decreased step length - left Gait velocity: decreased     General Gait Details: ataxic movement; assist to steady and manage walker  Careers information officer     Tilt  Bed    Modified Rankin (Stroke Patients Only)       Balance Overall balance assessment: Needs assistance Sitting-balance support: No upper extremity supported, Feet supported Sitting balance-Leahy Scale: Poor Sitting balance - Comments: mod assist for sitting balance with knee  extension and hip flexion B LE AROM (d/t posterior lean)   Standing balance support: Bilateral upper extremity supported, Reliant on assistive device for balance Standing balance-Leahy Scale: Poor Standing balance comment: pt with weight through B heels both trials standing and pushing LE's against recliner on 2nd trial standing requiring cueing and assist to shift weight forward                             Pertinent Vitals/Pain Pain Assessment Pain Assessment: No/denies pain    Home Living Family/patient expects to be discharged to:: Private residence Living Arrangements: Other relatives (Pt's brother) Available Help at Discharge: Family;Available PRN/intermittently Type of Home: House Home Access: Stairs to enter Entrance Stairs-Rails: None Entrance Stairs-Number of Steps: 5   Home Layout: One level Home Equipment: Architectural technologist (4 wheels)      Prior Function Prior Level of Function : Needs assist             Mobility Comments: Modified independent ambulating with quad cane in home; uses rollator in community.  2 falls in past month (pt tripped and fell forward). ADLs Comments: Brother brings wash basin for bathing.     Extremity/Trunk Assessment   Upper Extremity Assessment Upper Extremity Assessment: Defer to OT evaluation (B UE strength WFL for tasks assessed)    Lower Extremity Assessment Lower Extremity Assessment: RLE deficits/detail;LLE deficits/detail RLE Deficits / Details: hip flexion 4+/5; knee flexion/extension and DF 4+/5 LLE Deficits / Details: hip flexion, knee flexion/extension, and DF 5/5    Cervical / Trunk Assessment Cervical / Trunk Assessment: Normal  Communication   Communication Communication: Impaired Factors Affecting Communication: Difficulty expressing self (Word finding difficulty)    Cognition Arousal: Alert Behavior During Therapy: Anxious, Impulsive, Flat affect   PT - Cognitive impairments: No  family/caregiver present to determine baseline                       PT - Cognition Comments: Oriented to name, DOB; able to state location with multiple choice; pt able to state it was June (reported it was 2020); pt able to state h/o CVA Following commands: Impaired Following commands impaired: Follows multi-step commands inconsistently     Cueing Cueing Techniques: Verbal cues, Visual cues     General Comments General comments (skin integrity, edema, etc.): Pt's gown and towel between legs note to be wet (new gown donned and towel placed in dirty linen hamper).  Nursing cleared pt for participation in physical therapy.  Pt agreeable to PT session.    Exercises     Assessment/Plan    PT Assessment Patient needs continued PT services  PT Problem List Decreased strength;Decreased activity tolerance;Decreased balance;Decreased mobility;Decreased coordination;Decreased cognition;Decreased knowledge of use of DME;Decreased knowledge of precautions;Decreased safety awareness       PT Treatment Interventions DME instruction;Gait training;Stair training;Functional mobility training;Therapeutic activities;Therapeutic exercise;Balance training;Patient/family education    PT Goals (Current goals can be found in the Care Plan section)  Acute Rehab PT Goals Patient Stated Goal: to improve walking PT Goal Formulation: With patient Time For Goal Achievement: 12/22/23 Potential to Achieve Goals: Good    Frequency Min 2X/week  Co-evaluation               AM-PAC PT "6 Clicks" Mobility  Outcome Measure Help needed turning from your back to your side while in a flat bed without using bedrails?: A Little Help needed moving from lying on your back to sitting on the side of a flat bed without using bedrails?: A Little Help needed moving to and from a bed to a chair (including a wheelchair)?: A Lot Help needed standing up from a chair using your arms (e.g., wheelchair or  bedside chair)?: A Lot Help needed to walk in hospital room?: Total Help needed climbing 3-5 steps with a railing? : Total 6 Click Score: 12    End of Session Equipment Utilized During Treatment: Gait belt Activity Tolerance: Patient tolerated treatment well Patient left: in chair;Other (comment) (with OT present for OT evaluation (OT to set pt up and place chair alarm when finished)) Nurse Communication: Mobility status;Precautions PT Visit Diagnosis: Unsteadiness on feet (R26.81);Other abnormalities of gait and mobility (R26.89);Muscle weakness (generalized) (M62.81);History of falling (Z91.81);Other symptoms and signs involving the nervous system (R29.898)    Time: 1335-1401 PT Time Calculation (min) (ACUTE ONLY): 26 min   Charges:   PT Evaluation $PT Eval Low Complexity: 1 Low PT Treatments $Therapeutic Activity: 8-22 mins PT General Charges $$ ACUTE PT VISIT: 1 Visit        Amador Junes, PT 12/08/23, 2:40 PM

## 2023-12-08 NOTE — TOC CM/SW Note (Signed)
 Patient not fully oriented. Tried calling brother to discuss SNF recommendation. It says his phone is not currently accepting calls. Will try again later.  [name], CSW [phone]

## 2023-12-08 NOTE — Plan of Care (Signed)

## 2023-12-08 NOTE — Progress Notes (Signed)
 PROGRESS NOTE    [name]  RUE:[id number] DOB: [birth year] DOA: 12/06/2023 PCP: [name], NP    Brief Narrative:   53 y.o. male with medical history significant of stroke, community hydrocephalus, possible mild dementia on donepezil , hypertension, hyperlipidemia, diabetes mellitus, diastolic CHF, depression, BPH, who presents with fall, dark stool, weakness.   Patient states he has been feeling weak recently, and has had multiple falls recently, with last fall happened this morning.  No LOC.  No significant injury.  He complains of right knee pain, which seem to be a chronic issue.  Patient talks slowly, but clear in talking.  Per reported, patient had right-sided weakness from previous stroke, but on my examination, patient's muscle strength in extremities seem to be normal.  No facial droop or slurred speech.  Patient denies chest pain, cough, SOB.  No nausea, vomiting, diarrhea or abdominal pain.  He reports intermittent dark stool for about 1 month.  No fever or chills.  Denies dysuria, hematuria or burning with urination.  Assessment & Plan:   Principal Problem:   Severe sepsis (HCC) Active Problems:   UTI (urinary tract infection)   GI bleeding   Pancytopenia (HCC)   Hypernatremia   Thrombocytopenia (HCC)   Hypertension associated with diabetes (HCC)   Hyperlipidemia associated with type 2 diabetes mellitus (HCC)   Abnormal LFTs   Type 2 diabetes mellitus with diabetic polyneuropathy (HCC)   Chronic diastolic CHF (congestive heart failure) (HCC)   History of stroke   Chronic communicating hydrocephalus (HCC)   BPH (benign prostatic hyperplasia)   Fall at home, initial encounter   Depression   Hypothermia  Severe sepsis due to UTI (urinary tract infection)  Sepsis due to UTI. Pt meets criteria for severe sepsis with WBC <3.5 (which is < 4.0), hypothermia with temperature 91.1.  Lactic acid 3.1 --> 3.0.  Blood pressure is soft, but hemodynamically stable.  Pt has  hx of positive urine culture for Acinetobacter Baumanni which is intermittently susceptible to ceftriaxone, will use cefepime today. Plan: Continue IV cefepime for now Follow cultures, discontinue cefepime if cultures remain negative for next 24 hours IV fluids Monitor vitals and fever curve   GI bleeding and pancytopenia (HCC):  Hgb 14.1 --> 11.8,  then down to 10.5.   Patient has intermittent dark stool for about a month.  Pt is taking ASA and Plavix . Plan: Continue to hold DAPT aspirin  and Plavix  Continue full liquid diet for now IV PPI twice daily Avoid NSAIDs and chemoprophylaxis GI follow-up to determine need and timing for endoluminal evaluation  Thrombocytopenia:  Monitor.  No indication for transfusion   Hypernatremia:  Na 150.  Did not respond to half-normal saline Responded to D5 water, now in reference range Plan: DC fluids   Hypertension associated with diabetes (HCC):  BP controlled without use of antihypertensives - Continue to hold amlodipine , Coreg , lisinopril  - IV hydralazine  as needed   Hyperlipidemia associated with type 2 diabetes mellitus (HCC) - Continue   Abnormal LFTs: Mild.  ALP 70, AST 63, ALT 77, total bilirubin 0.4.  Possibly due to severe sepsis. - Low-dose Tylenol  as needed - Hepatitis panel negative.   Type 2 diabetes mellitus with diabetic polyneuropathy (HCC): Recent A1c 5.5, well-controlled.  Patient is taking metformin  - Hold metformin .  SSI for now   Chronic diastolic CHF (congestive heart failure) (HCC): 2D echo on 09/05/2020 showed EF of 50-55% with grade 2 diastolic dysfunction.  Patient has 1+ leg edema, no SOB, no oxygen   desaturation, does not seem to have CHF exacerbation.  Continue monitor   History of stroke - Continue to hold aspirin , Plavix  - Continue Lipitor    Chronic communicating hydrocephalus [facility]): Patient seems to have mild cognitive impairment. - Continue donepezil    BPH (benign prostatic hyperplasia) - Continue  Flomax , oxybutynin    Fall at home, initial encounter - Continue fall precaution - PT/OT - Follow-up CT of head   Depression - Continue Celexa    Hypothermia Resolved.  Vitals per unit protocol   DVT prophylaxis: SCD Code Status: Full Family Communication: Brother via phone 6/1 Disposition Plan: Status is: Inpatient Remains inpatient appropriate because: Multiple acute issues as above   Level of care: Progressive  Consultants:  GI  Procedures:  None  Antimicrobials: Cefepime   Subjective: Seen and examined.  Lying in bed.  No visible distress.  Objective: Vitals:   12/07/23 2337 12/08/23 0356 12/08/23 0500 12/08/23 0758  BP: 121/82 114/82  (!) 126/96  Pulse: 62 (!) 59  63  Resp: 16 16    Temp: 97.8 F (36.6 C) 97.6 F (36.4 C)  97.7 F (36.5 C)  TempSrc:      SpO2: 97% 98%  98%  Weight:   119.9 kg   Height:        Intake/Output Summary (Last 24 hours) at 12/08/2023 1102 Last data filed at 12/08/2023 0900 Gross per 24 hour  Intake 2551.9 ml  Output 400 ml  Net 2151.9 ml   Filed Weights   12/06/23 1935 12/07/23 0230 12/08/23 0500  Weight: 99.8 kg 105.5 kg 119.9 kg    Examination:  General exam: NAD Respiratory system: Lungs clear normal work of breathing.  Room air Cardiovascular system: S1-2, RRR, no murmurs, no pedal edema Gastrointestinal system:, NT/ND, normal bowel sounds Central nervous system: Alert, oriented x 2, dysarthric speech Extremities: Symmetric 5 x 5 power. Skin: No rashes, lesions or ulcers Psychiatry: Judgement and insight appear impaired. Mood & affect confused.     Data Reviewed: I have personally reviewed following labs and imaging studies  CBC: Recent Labs  Lab 12/06/23 1940 12/06/23 2358 12/07/23 0617 12/07/23 0928 12/08/23 0954  WBC 3.5* 2.7* 3.0* 2.9* 2.7*  NEUTROABS 2.0  --   --   --  1.5*  HGB 11.8* 10.5* 10.9* 11.1* 11.3*  HCT 38.7* 34.7* 34.9* 36.1* 36.0*  MCV 95.8 96.4 94.3 94.3 93.0  PLT 118* 103* 104*  104* 109*   Basic Metabolic Panel: Recent Labs  Lab 12/06/23 1940 12/06/23 2358 12/07/23 0928 12/08/23 0954  NA 150* 150* 149* 142  K 3.8 3.9 4.0 4.0  CL 116* 116* 114* 107  CO2 23 24 28 27   GLUCOSE 82 81 71 197*  BUN 36* 35* 30* 22*  CREATININE 1.31* 1.33* 1.32* 1.34*  CALCIUM  8.7* 8.3* 8.3* 8.3*   GFR: Estimated Creatinine Clearance: 88.5 mL/min (A) (by C-G formula based on SCr of 1.34 mg/dL (H)). Liver Function Tests: Recent Labs  Lab 12/06/23 1940  AST 63*  ALT 77*  ALKPHOS 70  BILITOT 0.4  PROT 7.2  ALBUMIN 3.8   No results for input(s): "LIPASE", "AMYLASE" in the last 168 hours. No results for input(s): "AMMONIA" in the last 168 hours. Coagulation Profile: Recent Labs  Lab 12/06/23 2358  INR 1.3*   Cardiac Enzymes: Recent Labs  Lab 12/06/23 2358  CKTOTAL 75   BNP (last 3 results) No results for input(s): "PROBNP" in the last 8760 hours. HbA1C: No results for input(s): "HGBA1C" in the last 72 hours.  CBG: Recent Labs  Lab 12/07/23 0847 12/07/23 1206 12/07/23 1614 12/07/23 2137 12/08/23 0759  GLUCAP 70 81 140* 144* 137*   Lipid Profile: No results for input(s): "CHOL", "HDL", "LDLCALC", "TRIG", "CHOLHDL", "LDLDIRECT" in the last 72 hours. Thyroid  Function Tests: Recent Labs    12/06/23 2358  TSH 1.525  FREET4 0.51*   Anemia Panel: Recent Labs    12/06/23 2358  VITAMINB12 1,390*  FOLATE 6.2  FERRITIN 133  TIBC 231*  IRON 70  RETICCTPCT 1.3   Sepsis Labs: Recent Labs  Lab 12/06/23 2148 12/06/23 2358 12/07/23 0148 12/07/23 0617 12/07/23 0928  PROCALCITON  --  <0.10  --   --   --   LATICACIDVEN 3.0*  --  3.3* 0.9 0.9    Recent Results (from the past 240 hours)  Blood culture (routine x 2)     Status: None (Preliminary result)   Collection Time: 12/06/23  9:52 PM   Specimen: BLOOD LEFT WRIST  Result Value Ref Range Status   Specimen Description BLOOD LEFT WRIST  Final   Special Requests   Final    BOTTLES DRAWN AEROBIC  AND ANAEROBIC Blood Culture adequate volume   Culture   Final    NO GROWTH < 12 HOURS Performed at [facility], [address] [place], [place] [address]    Report Status PENDING  Incomplete  Blood culture (routine x 2)     Status: None (Preliminary result)   Collection Time: 12/06/23 10:05 PM   Specimen: BLOOD RIGHT ARM  Result Value Ref Range Status   Specimen Description BLOOD RIGHT ARM  Final   Special Requests   Final    BOTTLES DRAWN AEROBIC AND ANAEROBIC Blood Culture adequate volume   Culture   Final    NO GROWTH < 12 HOURS Performed at [facility], [address], [place], [place] [address]    Report Status PENDING  Incomplete  Urine Culture     Status: Abnormal (Preliminary result)   Collection Time: 12/06/23 10:31 PM   Specimen: Urine, Random  Result Value Ref Range Status   Specimen Description   Final    URINE, RANDOM Performed at [facility], [address] [place], [place] [address]    Special Requests   Final    NONE Reflexed from [id number] Performed at [facility], [address] [place], [place] [address]    Culture >=100,000 COLONIES/mL GRAM NEGATIVE RODS (A)  Final   Report Status PENDING  Incomplete         Radiology Studies: CT HEAD WO CONTRAST ( ) Result Date: 12/07/2023 EXAM: CT HEAD WITHOUT TECHNIQUE: CT of the head was performed without the administration of intravenous contrast. Automated exposure control, iterative reconstruction, and/or weight based adjustment of the mA/kV was utilized to reduce the radiation dose to as low as reasonably achievable. 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 Old left basal ganglia lacunar infarct. There is no acute intracranial hemorrhage, mass effect or midline shift. No abnormal extra-axial fluid  collection. The gray-white differentiation is maintained without evidence of an acute infarct. Empty sella. ORBITS: The visualized portion of the orbits demonstrate no acute abnormality. SINUSES: The visualized paranasal sinuses and mastoid air cells demonstrate no acute abnormality. SOFT TISSUES AND SKULL: Vascular calcifications. No acute abnormality of the visualized skull or soft tissues. IMPRESSION: 1. No acute findings. 2. 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 3. Old left basal ganglia lacunar infarct. Electronically signed by: Zadie Herter MD 12/07/2023 02:10 AM EDT RP Workstation: [id number]   DG Chest Portable 1 View Result Date: 12/06/2023 CLINICAL DATA:  Weakness.  Patient fell at home. EXAM: PORTABLE CHEST 1 VIEW COMPARISON:  08/31/2023 FINDINGS: Normal heart size and pulmonary vascularity. No focal airspace disease or consolidation in the lungs. No blunting of costophrenic angles. No pneumothorax. Mediastinal contours appear intact. Loop recorder is present. Old appearing right rib fractures. IMPRESSION: No active disease. Electronically Signed   By: Boyce Byes M.D.   On: 12/06/2023 22:22   DG Knee Complete 4 Views Right Result Date: 12/06/2023 CLINICAL DATA:  Right knee pain after a fall. EXAM: RIGHT KNEE - COMPLETE 4+ VIEW COMPARISON:  None Available. FINDINGS: Degenerative changes in the right knee with medial and lateral compartment narrowing and small osteophyte formation. Growth arrest lines in the proximal tibia. No evidence of acute fracture or dislocation. No focal bone lesion or bone destruction. Bone cortex appears intact. No significant effusions. Soft tissues are unremarkable. Vascular calcifications. IMPRESSION: Moderate degenerative changes in the right knee. No acute displaced fractures identified. Electronically Signed   By: Boyce Byes M.D.   On: 12/06/2023 20:10         Scheduled Meds:  atorvastatin   80 mg Oral Daily   citalopram   10 mg Oral Daily   cyanocobalamin  1,000 mcg Oral Daily   donepezil   5 mg Oral Daily   gabapentin   300 mg Oral TID   insulin  aspart  0-5 Units Subcutaneous QHS   insulin  aspart  0-9 Units Subcutaneous TID WC   multivitamin with minerals  1 tablet Oral Daily   oxybutynin   10 mg Oral Daily   pantoprazole (PROTONIX) IV  40 mg Intravenous Q12H   tamsulosin   0.4 mg Oral QPC supper   Continuous Infusions:  ceFEPime (MAXIPIME) IV 2 g (12/08/23 0524)     LOS: 2 days  Tiajuana Fluke, MD [facility]   If 7PM-7AM, please contact night-coverage  12/08/2023, 11:02 AM

## 2023-12-08 NOTE — Care Management Important Message (Signed)
 Important Message  Patient Details  Name: MITCHEL DELDUCA MRN: [id number] Date of Birth: 1971/06/30   Important Message Given:  Yes - Medicare IM     Anise Kerns 12/08/2023, 1:00 PM

## 2023-12-09 ENCOUNTER — Inpatient Hospital Stay: Admitting: Certified Registered"

## 2023-12-09 ENCOUNTER — Encounter: Payer: Self-pay | Admitting: Internal Medicine

## 2023-12-09 ENCOUNTER — Encounter
Admission: EM | Disposition: A | Discharge status: Skilled Nursing Facility | Payer: Self-pay | Source: Home / Self Care | Attending: Internal Medicine

## 2023-12-09 DIAGNOSIS — K319 Disease of stomach and duodenum, unspecified: Secondary | ICD-10-CM

## 2023-12-09 DIAGNOSIS — A419 Sepsis, unspecified organism: Secondary | ICD-10-CM | POA: Diagnosis not present

## 2023-12-09 DIAGNOSIS — R6520 Severe sepsis without septic shock: Secondary | ICD-10-CM | POA: Diagnosis not present

## 2023-12-09 HISTORY — PX: ESOPHAGOGASTRODUODENOSCOPY: SHX5428

## 2023-12-09 LAB — CBC WITH DIFFERENTIAL/PLATELET
Abs Immature Granulocytes: 0 10*3/uL (ref 0.00–0.07)
Basophils Absolute: 0 10*3/uL (ref 0.0–0.1)
Basophils Relative: 1 %
Eosinophils Absolute: 0.1 10*3/uL (ref 0.0–0.5)
Eosinophils Relative: 5 %
HCT: 36.9 % — ABNORMAL LOW (ref 39.0–52.0)
Hemoglobin: 11.8 g/dL — ABNORMAL LOW (ref 13.0–17.0)
Immature Granulocytes: 0 %
Lymphocytes Relative: 44 %
Lymphs Abs: 0.9 10*3/uL (ref 0.7–4.0)
MCH: 29.3 pg (ref 26.0–34.0)
MCHC: 32 g/dL (ref 30.0–36.0)
MCV: 91.6 fL (ref 80.0–100.0)
Monocytes Absolute: 0.2 10*3/uL (ref 0.1–1.0)
Monocytes Relative: 11 %
Neutro Abs: 0.8 10*3/uL — ABNORMAL LOW (ref 1.7–7.7)
Neutrophils Relative %: 39 %
Platelets: 109 10*3/uL — ABNORMAL LOW (ref 150–400)
RBC: 4.03 MIL/uL — ABNORMAL LOW (ref 4.22–5.81)
RDW: 14.9 % (ref 11.5–15.5)
Smear Review: NORMAL
WBC: 1.9 10*3/uL — ABNORMAL LOW (ref 4.0–10.5)
nRBC: 0 % (ref 0.0–0.2)

## 2023-12-09 LAB — URINE CULTURE: Culture: >=100000 — AB

## 2023-12-09 LAB — GLUCOSE, CAPILLARY
Glucose-Capillary: 128 mg/dL — ABNORMAL HIGH (ref 70–99)
Glucose-Capillary: 190 mg/dL — ABNORMAL HIGH (ref 70–99)
Glucose-Capillary: 149 mg/dL — ABNORMAL HIGH (ref 70–99)

## 2023-12-09 LAB — BASIC METABOLIC PANEL WITH GFR
Anion gap: 8 (ref 5–15)
BUN: 14 mg/dL (ref 6–20)
CO2: 26 mmol/L (ref 22–32)
Calcium: 8.7 mg/dL — ABNORMAL LOW (ref 8.9–10.3)
Chloride: 113 mmol/L — ABNORMAL HIGH (ref 98–111)
Creatinine, Ser: 1.19 mg/dL (ref 0.61–1.24)
GFR, Estimated: >60 mL/min (ref >60)
Glucose, Bld: 112 mg/dL — ABNORMAL HIGH (ref 70–99)
Potassium: 4.2 mmol/L (ref 3.5–5.1)
Sodium: 147 mmol/L — ABNORMAL HIGH (ref 135–145)

## 2023-12-09 SURGERY — EGD (ESOPHAGOGASTRODUODENOSCOPY)
Anesthesia: General

## 2023-12-09 MED ORDER — PANTOPRAZOLE SODIUM 40 MG PO TBEC
40.0000 mg | DELAYED_RELEASE_TABLET | Freq: Every day | ORAL | Status: DC
  Administered 2023-12-10 – 2023-12-11 (×2): 40 mg via ORAL
  Filled 2023-12-09 (×2): qty 1

## 2023-12-09 MED ORDER — SODIUM CHLORIDE 0.9 % IV SOLN
1.0000 g | INTRAVENOUS | Status: DC
  Administered 2023-12-09 – 2023-12-10 (×2): 1 g via INTRAVENOUS
  Filled 2023-12-09 (×3): qty 10

## 2023-12-09 MED ORDER — MIDAZOLAM HCL 2 MG/2ML IJ SOLN
INTRAMUSCULAR | Status: AC
  Filled 2023-12-09: qty 2

## 2023-12-09 MED ORDER — GLYCOPYRROLATE 0.2 MG/ML IJ SOLN
INTRAMUSCULAR | Status: DC | PRN
  Administered 2023-12-09: .2 mg via INTRAVENOUS

## 2023-12-09 MED ORDER — PROPOFOL 500 MG/50ML IV EMUL
INTRAVENOUS | Status: DC | PRN
  Administered 2023-12-09: 155 ug/kg/min via INTRAVENOUS

## 2023-12-09 MED ORDER — PROPOFOL 10 MG/ML IV BOLUS
INTRAVENOUS | Status: DC | PRN
  Administered 2023-12-09: 60 mg via INTRAVENOUS
  Administered 2023-12-09: 20 mg via INTRAVENOUS

## 2023-12-09 MED ORDER — LIDOCAINE HCL (CARDIAC) PF 100 MG/5ML IV SOSY
PREFILLED_SYRINGE | INTRAVENOUS | Status: DC | PRN
  Administered 2023-12-09: 100 mg via INTRAVENOUS

## 2023-12-09 MED ORDER — SODIUM CHLORIDE 0.9 % IV SOLN
INTRAVENOUS | Status: DC | PRN
Start: 2023-12-09 — End: 2023-12-09

## 2023-12-09 NOTE — Op Note (Signed)
 [facility] Gastroenterology Patient Name: [name] Procedure Date: 12/09/2023 11:18 AM MRN: [id number] Account #: [id number] Date of Birth: Sep 21, 1970 Admit Type: Inpatient Age: 53 Room: [facility] ENDO ROOM 4 Gender: Male Note Status: Finalized Instrument Name: Almyra Jain [id number] Procedure:             Upper GI endoscopy Indications:           Iron deficiency anemia Providers:             Marnee Sink MD, MD Referring MD:          [name] T. Cannady (Referring MD) Medicines:             Propofol  per Anesthesia Complications:         No immediate complications. Procedure:             Pre-Anesthesia Assessment:                        - Prior to the procedure, a History and Physical was                         performed, and patient medications and allergies were                         reviewed. The patient's tolerance of previous                         anesthesia was also reviewed. The risks and benefits                         of the procedure and the sedation options and risks                         were discussed with the patient. All questions were                         answered, and informed consent was obtained. Prior                         Anticoagulants: The patient has taken no anticoagulant                         or antiplatelet agents. ASA Grade Assessment: II - A                         patient with mild systemic disease. After reviewing                         the risks and benefits, the patient was deemed in                         satisfactory condition to undergo the procedure.                        After obtaining informed consent, the endoscope was                         passed under direct vision. Throughout the procedure,  the patient's blood pressure, pulse, and oxygen                          saturations were monitored continuously. The Endoscope                         was introduced through the mouth, and  advanced to the                         second part of duodenum. The upper GI endoscopy was                         accomplished without difficulty. The patient tolerated                         the procedure well. Findings:      The Z-line was irregular and was found at the gastroesophageal junction.      A single localized erosion with no bleeding and no stigmata of recent       bleeding was found in the gastric antrum.      The examined duodenum was normal. Impression:            - Z-line irregular, at the gastroesophageal junction.                        - Erosive gastropathy with no bleeding and no stigmata                         of recent bleeding.                        - Normal examined duodenum.                        - No specimens collected. Recommendation:        - Return patient to hospital ward for ongoing care.                        - Resume previous diet.                        - Continue present medications. Procedure Code(s):     --- Professional ---                        [phone], Esophagogastroduodenoscopy, flexible,                         transoral; diagnostic, including collection of                         specimen(s) by brushing or washing, when performed                         (separate procedure) Diagnosis Code(s):     --- Professional ---                        D50.9, Iron deficiency anemia, unspecified CPT copyright 2022 American Medical Association. All rights reserved. The codes documented in this report are preliminary and upon coder review  may  be revised to meet current compliance requirements. Marnee Sink MD, MD 12/09/2023 11:50:54 AM This report has been signed electronically. Number of Addenda: 0 Note Initiated On: 12/09/2023 11:18 AM Estimated Blood Loss:  Estimated blood loss: none.      [facility]

## 2023-12-09 NOTE — Anesthesia Procedure Notes (Signed)
 Procedure Name: General with mask airway Date/Time: 12/09/2023 11:42 AM  Performed by: Niki Barter, CRNAPre-anesthesia Checklist: Patient identified, Emergency Drugs available, Suction available and Patient being monitored Patient Re-evaluated:Patient Re-evaluated prior to induction Oxygen  Delivery Method: Simple face mask Induction Type: IV induction Placement Confirmation: positive ETCO2 and breath sounds checked- equal and bilateral Dental Injury: Teeth and Oropharynx as per pre-operative assessment

## 2023-12-09 NOTE — Anesthesia Preprocedure Evaluation (Signed)
 Anesthesia Evaluation  Patient identified by MRN, date of birth, ID band Patient awake    Reviewed: Allergy & Precautions, NPO status , Patient's Chart, lab work & pertinent test results  History of Anesthesia Complications Negative for: history of anesthetic complications  Airway Mallampati: IV   Neck ROM: Full    Dental  (+) Missing, Chipped   Pulmonary former smoker   Pulmonary exam normal breath sounds clear to auscultation       Cardiovascular hypertension, +CHF (diastolic)  Normal cardiovascular exam Rhythm:Regular Rate:Normal  ECG 12/07/23: junctional rhythm   Neuro/Psych Seizures -,  PSYCHIATRIC DISORDERS  Depression    Hydrocephalus  CVA (residual right-sided weakness, on Plavix )    GI/Hepatic   Endo/Other  diabetes, Type 2  Obesity   Renal/GU    BPH    Musculoskeletal   Abdominal   Peds  Hematology  (+) Blood dyscrasia, anemia   Anesthesia Other Findings   Reproductive/Obstetrics                             Anesthesia Physical Anesthesia Plan  ASA: 3  Anesthesia Plan: General   Post-op Pain Management:    Induction: Intravenous  PONV Risk Score and Plan: 2 and Propofol  infusion, TIVA and Treatment may vary due to age or medical condition  Airway Management Planned: Natural Airway  Additional Equipment:   Intra-op Plan:   Post-operative Plan:   Informed Consent: I have reviewed the patients History and Physical, chart, labs and discussed the procedure including the risks, benefits and alternatives for the proposed anesthesia with the patient or authorized representative who has indicated his/her understanding and acceptance.     Consent reviewed with POA  Plan Discussed with: CRNA  Anesthesia Plan Comments: (History and consent obtained from patient's brother via phone.  LMA/GETA backup discussed.  Patient's brother consented for risks of anesthesia including  but not limited to:  - adverse reactions to medications - damage to eyes, teeth, lips or other oral mucosa - nerve damage due to positioning  - sore throat or hoarseness - damage to heart, brain, nerves, lungs, other parts of body or loss of life  Informed patient's brother about role of CRNA in peri- and intra-operative care; he voiced understanding.)       Anesthesia Quick Evaluation

## 2023-12-09 NOTE — Anesthesia Postprocedure Evaluation (Signed)
 Anesthesia Post Note  Patient: [name]  Procedure(s) Performed: EGD (ESOPHAGOGASTRODUODENOSCOPY)  Patient location during evaluation: PACU Anesthesia Type: General Level of consciousness: awake and alert, oriented and patient cooperative Pain management: pain level controlled Vital Signs Assessment: post-procedure vital signs reviewed and stable Respiratory status: spontaneous breathing, nonlabored ventilation and respiratory function stable Cardiovascular status: blood pressure returned to baseline and stable Postop Assessment: adequate PO intake Anesthetic complications: no   No notable events documented.   Last Vitals:  Vitals:   12/09/23 1203 12/09/23 1213  BP: (!) 123/92 (!) 130/98  Pulse: 69 70  Resp: (!) 9 11  Temp:    SpO2: 98% 98%    Last Pain:  Vitals:   12/09/23 1213  TempSrc:   PainSc: 0-No pain                 Dorothey Gate

## 2023-12-09 NOTE — Progress Notes (Signed)
 Physical Therapy Treatment Patient Details Name: [name] MRN: [id number] DOB: 1970/07/26 Today's Date: 12/09/2023   History of Present Illness Pt is a 53 y.o. male presenting to hospital 12/06/23 with c/o falls and AMS; c/o R knee pain after recent fall (but has been able to walk afterwards).  Pt admitted with severe sepsis d/t UTI, GI bleeding and pancytopenia, thrombocytopenia, hypernatremia, htn, abnormal LFT's.  PMH includes stroke, community hydrocephalus, possible mild dementia, htn, HLD, DM, diastolic CHF, depression, BPH, seizures.    PT Comments  Pt resting in bed upon PT arrival; pt agreeable to therapy.  No c/o pain during session.  Pt was SBA to CGA with bed mobility; min assist x2 for transfers; and min assist x2 to ambulate 15 feet with RW use.  Posterior lean noted in standing requiring cueing/assist to correct.  Ataxic movement noted during session (R>L LE with ambulation) requiring assist for safety, balance, and for optimal R ankle positioning (neutral) during R LE stance phase.  Will continue to focus on strengthening, balance, and progressive functional mobility during hospitalization.    If plan is discharge home, recommend the following: Two people to help with walking and/or transfers;A lot of help with bathing/dressing/bathroom;Assistance with cooking/housework;Assist for transportation;Help with stairs or ramp for entrance   Can travel by private vehicle     No  Equipment Recommendations  Rolling walker (2 wheels);BSC/3in1    Recommendations for Other Services       Precautions / Restrictions Precautions Precautions: Fall Recall of Precautions/Restrictions: Impaired Restrictions Weight Bearing Restrictions Per Provider Order: No     Mobility  Bed Mobility Overal bed mobility: Needs Assistance Bed Mobility: Supine to Sit, Sit to Supine     Supine to sit: Supervision, HOB elevated Sit to supine: Contact guard assist, HOB elevated   General bed mobility  comments: mild increased effort to perform on own    Transfers Overall transfer level: Needs assistance Equipment used: Rolling walker (2 wheels) Transfers: Sit to/from Stand, Bed to chair/wheelchair/BSC Sit to Stand: Min assist, +2 physical assistance   Step pivot transfers: Contact guard assist, Min assist, +2 physical assistance (SST bed to recliner with RW use)       General transfer comment: x1 trial from bed and x1 trial from recliner (sit to stand); vc's for UE and LE positioning; assist for balance initially d/t posterior lean (pt with weight mostly through heels)    Ambulation/Gait Ambulation/Gait assistance: Min assist, +2 physical assistance Gait Distance (Feet): 15 Feet Assistive device: Rolling walker (2 wheels)   Gait velocity: decreased     General Gait Details: ataxic (R>L); assist to prevent R LE overpronation when pt correcting narrow BOS/excessive inversion; assist to steady; assist for walker management (especially for turns); B knee hyperextension noted during stance phase   Stairs             Wheelchair Mobility     Tilt Bed    Modified Rankin (Stroke Patients Only)       Balance Overall balance assessment: Needs assistance Sitting-balance support: No upper extremity supported, Feet supported Sitting balance-Leahy Scale: Fair Sitting balance - Comments: steady static sitting   Standing balance support: Bilateral upper extremity supported, Reliant on assistive device for balance Standing balance-Leahy Scale: Poor Standing balance comment: posterior lean in standing, needs cues for anterior weight shift                            Communication Communication  Communication: Impaired Factors Affecting Communication: Difficulty expressing self (word finding difficulty)  Cognition Arousal: Alert Behavior During Therapy: Anxious, Impulsive, Flat affect   PT - Cognitive impairments: No family/caregiver present to determine  baseline                         Following commands: Impaired Following commands impaired: Follows multi-step commands inconsistently    Cueing Cueing Techniques: Verbal cues, Visual cues  Exercises General Exercises - Lower Extremity Long Arc Quad: AAROM, Strengthening, Both, 10 reps, Seated (assist for quality of motion)    General Comments General comments (skin integrity, edema, etc.): Bed linens noted to be wet so whole bed linen change performed; pt requesting bath--NT and nurse notified.  Nursing cleared pt for participation in physical therapy.  Pt agreeable to PT session.      Pertinent Vitals/Pain Pain Assessment Pain Assessment: No/denies pain HR 51-56 bpm during session; SpO2 sats 98% or greater on room air.    Home Living                          Prior Function            PT Goals (current goals can now be found in the care plan section) Acute Rehab PT Goals Patient Stated Goal: to improve walking PT Goal Formulation: With patient Time For Goal Achievement: 12/22/23 Potential to Achieve Goals: Good Progress towards PT goals: Progressing toward goals    Frequency    Min 2X/week      PT Plan      Co-evaluation              AM-PAC PT "6 Clicks" Mobility   Outcome Measure  Help needed turning from your back to your side while in a flat bed without using bedrails?: A Little Help needed moving from lying on your back to sitting on the side of a flat bed without using bedrails?: A Little Help needed moving to and from a bed to a chair (including a wheelchair)?: A Lot Help needed standing up from a chair using your arms (e.g., wheelchair or bedside chair)?: A Lot Help needed to walk in hospital room?: Total Help needed climbing 3-5 steps with a railing? : Total 6 Click Score: 12    End of Session Equipment Utilized During Treatment: Gait belt Activity Tolerance: Patient tolerated treatment well Patient left: in bed;with call  bell/phone within reach;with bed alarm set;with SCD's reapplied;Other (comment) (fall mat in place) Nurse Communication: Mobility status;Precautions PT Visit Diagnosis: Unsteadiness on feet (R26.81);Other abnormalities of gait and mobility (R26.89);Muscle weakness (generalized) (M62.81);History of falling (Z91.81);Other symptoms and signs involving the nervous system (R29.898)     Time: 6295-2841 PT Time Calculation (min) (ACUTE ONLY): 25 min  Charges:    $Gait Training: 8-22 mins $Therapeutic Activity: 8-22 mins PT General Charges $$ ACUTE PT VISIT: 1 Visit                     Amador Junes, PT 12/09/23, 10:07 AM

## 2023-12-09 NOTE — Transfer of Care (Signed)
 Immediate Anesthesia Transfer of Care Note  Patient: [name]  Procedure(s) Performed: EGD (ESOPHAGOGASTRODUODENOSCOPY)  Patient Location: Endoscopy Unit  Anesthesia Type:General  Level of Consciousness: awake, drowsy, and patient cooperative  Airway & Oxygen  Therapy: Patient Spontanous Breathing and Patient connected to face mask oxygen   Post-op Assessment: Report given to RN and Post -op Vital signs reviewed and stable  Post vital signs: Reviewed and stable  Last Vitals:  Vitals Value Taken Time  BP 125/93 12/09/23 1153  Temp 35.8 C 12/09/23 1153  Pulse 67 12/09/23 1158  Resp 12 12/09/23 1158  SpO2 100 % 12/09/23 1158  Vitals shown include unfiled device data.  Last Pain:  Vitals:   12/09/23 1153  TempSrc:   PainSc: Asleep         Complications: No notable events documented.

## 2023-12-09 NOTE — Progress Notes (Signed)
 PROGRESS NOTE    [name]  ZOX:[id number] DOB: Feb 04, 1971 DOA: 12/06/2023 PCP: [name], NP    Brief Narrative:   53 y.o. male with medical history significant of stroke, community hydrocephalus, possible mild dementia on donepezil , hypertension, hyperlipidemia, diabetes mellitus, diastolic CHF, depression, BPH, who presents with fall, dark stool, weakness.   Patient states he has been feeling weak recently, and has had multiple falls recently, with last fall happened this morning.  No LOC.  No significant injury.  He complains of right knee pain, which seem to be a chronic issue.  Patient talks slowly, but clear in talking.  Per reported, patient had right-sided weakness from previous stroke, but on my examination, patient's muscle strength in extremities seem to be normal.  No facial droop or slurred speech.  Patient denies chest pain, cough, SOB.  No nausea, vomiting, diarrhea or abdominal pain.  He reports intermittent dark stool for about 1 month.  No fever or chills.  Denies dysuria, hematuria or burning with urination.  Assessment & Plan:   Principal Problem:   Severe sepsis (HCC) Active Problems:   UTI (urinary tract infection)   GI bleeding   Pancytopenia (HCC)   Hypernatremia   Thrombocytopenia (HCC)   Hypertension associated with diabetes (HCC)   Hyperlipidemia associated with type 2 diabetes mellitus (HCC)   Abnormal LFTs   Type 2 diabetes mellitus with diabetic polyneuropathy (HCC)   Chronic diastolic CHF (congestive heart failure) (HCC)   History of stroke   Chronic communicating hydrocephalus (HCC)   BPH (benign prostatic hyperplasia)   Fall at home, initial encounter   Depression   Hypothermia  Severe sepsis due to UTI (urinary tract infection)  Sepsis due to UTI. Pt meets criteria for severe sepsis with WBC <3.5 (which is < 4.0), hypothermia with temperature 91.1.  Lactic acid 3.1 --> 3.0.  Blood pressure is soft, but hemodynamically stable.  Pt has  hx of positive urine culture for Acinetobacter Baumanni which is intermittently susceptible to ceftriaxone.  Urine culture positive for E. coli, pending today. Plan: DC cefepime Start ciprofloxacin  DC IV fluids Monitor vitals and fever curve   GI bleeding and pancytopenia (HCC):  Hgb 14.1 --> 11.8,  then down to 10.5.   Patient has intermittent dark stool for about a month.  Pt is taking ASA and Plavix . EGD today, gastric erosions noted No further GI follow-up Plan: Continue to hold DAPT aspirin  and Plavix  for today Check a.m. hemoglobin, if stable restart DAPT 6/4 Full liquid diet for today, advance as tolerated starting 6/4 P.o. PPI daily  Thrombocytopenia:  Monitor.  No indication for transfusion   Hypernatremia:  Na 150.  Did not respond to half-normal saline.  Did respond to D5 water Initially improved to 142, subsequently worsened to 147 Plan: Encourage p.o. fluid intake Recheck renal parameters in a.m. Consider restarting D5 water   Hypertension associated with diabetes (HCC):  BP starting to go up.  Antihypertensives have been held throughout the course of admission. Plan: As patient underwent anesthesia today will hold amlodipine , Coreg , lisinopril ..  Continue as needed IV hydralazine .  If blood pressure stable consider restarting home medication regimen 6/4.    Hyperlipidemia associated with type 2 diabetes mellitus (HCC) - Continue statin   Abnormal LFTs: Mild.  ALP 70, AST 63, ALT 77, total bilirubin 0.4.  Possibly due to severe sepsis. - Low-dose Tylenol  as needed - Hepatitis panel negative.   Type 2 diabetes mellitus with diabetic polyneuropathy (HCC): Recent A1c 5.5,  well-controlled.  Patient is taking metformin  - Hold metformin .  SSI for now   Chronic diastolic CHF (congestive heart failure) (HCC): 2D echo on 09/05/2020 showed EF of 50-55% with grade 2 diastolic dysfunction.  Patient has 1+ leg edema, no SOB, no oxygen  desaturation, does not seem to have  CHF exacerbation.  Continue monitor   History of stroke - Continue to hold aspirin , Plavix .  Consider restarting/for - Continue Lipitor    Chronic communicating hydrocephalus [facility]): Patient seems to have mild cognitive impairment. - Continue donepezil    BPH (benign prostatic hyperplasia) - Continue Flomax , oxybutynin    Fall at home, initial encounter - Continue fall precaution - PT/OT - Recommendation for skilled nursing facility at time of discharge   Depression - Continue Celexa    Hypothermia Resolved.  Vitals per unit protocol   DVT prophylaxis: SCD Code Status: Full Family Communication: Brother via phone 6/1 Disposition Plan: Status is: Inpatient Remains inpatient appropriate because: Multiple acute issues as above   Level of care: Progressive  Consultants:  GI  Procedures:  EGD  Antimicrobials: Cefepime Ceftriaxone   Subjective: Seen and examined.  Lying in bed.  No visible distress.  Objective: Vitals:   12/09/23 0731 12/09/23 1153 12/09/23 1203 12/09/23 1213  BP: (!) 143/99 (!) 125/93 (!) 123/92 (!) 130/98  Pulse: (!) 50 69 69 70  Resp: 17 10 (!) 9 11  Temp:  (!) 96.5 F (35.8 C)    TempSrc:      SpO2: 100% 100% 98% 98%  Weight:      Height:        Intake/Output Summary (Last 24 hours) at 12/09/2023 1444 Last data filed at 12/09/2023 1435 Gross per 24 hour  Intake 820 ml  Output 4420 ml  Net -3600 ml   Filed Weights   12/07/23 0230 12/08/23 0500 12/09/23 0522  Weight: 105.5 kg 119.9 kg 116 kg    Examination:  General exam: No acute distress Respiratory system: Lungs clear.  Normal work of breathing.  Room air Cardiovascular system: S1-2, RRR, no murmurs, no pedal edema Gastrointestinal system:, NT/ND, normal bowel sounds Central nervous system: Alert and oriented x 2.  Dysarthric speech Extremities: Symmetric 5 x 5 power. Skin: No rashes, lesions or ulcers Psychiatry: Judgement and insight appear impaired. Mood & affect confused.      Data Reviewed: I have personally reviewed following labs and imaging studies  CBC: Recent Labs  Lab 12/06/23 1940 12/06/23 2358 12/07/23 0617 12/07/23 0928 12/08/23 0954 12/09/23 1000  WBC 3.5* 2.7* 3.0* 2.9* 2.7* 1.9*  NEUTROABS 2.0  --   --   --  1.5* 0.8*  HGB 11.8* 10.5* 10.9* 11.1* 11.3* 11.8*  HCT 38.7* 34.7* 34.9* 36.1* 36.0* 36.9*  MCV 95.8 96.4 94.3 94.3 93.0 91.6  PLT 118* 103* 104* 104* 109* 109*   Basic Metabolic Panel: Recent Labs  Lab 12/06/23 1940 12/06/23 2358 12/07/23 0928 12/08/23 0954 12/09/23 1000  NA 150* 150* 149* 142 147*  K 3.8 3.9 4.0 4.0 4.2  CL 116* 116* 114* 107 113*  CO2 23 24 28 27 26   GLUCOSE 82 81 71 197* 112*  BUN 36* 35* 30* 22* 14  CREATININE 1.31* 1.33* 1.32* 1.34* 1.19  CALCIUM  8.7* 8.3* 8.3* 8.3* 8.7*   GFR: Estimated Creatinine Clearance: 98 mL/min (by C-G formula based on SCr of 1.19 mg/dL). Liver Function Tests: Recent Labs  Lab 12/06/23 1940  AST 63*  ALT 77*  ALKPHOS 70  BILITOT 0.4  PROT 7.2  ALBUMIN 3.8  No results for input(s): "LIPASE", "AMYLASE" in the last 168 hours. No results for input(s): "AMMONIA" in the last 168 hours. Coagulation Profile: Recent Labs  Lab 12/06/23 2358  INR 1.3*   Cardiac Enzymes: Recent Labs  Lab 12/06/23 2358  CKTOTAL 75   BNP (last 3 results) No results for input(s): "PROBNP" in the last 8760 hours. HbA1C: No results for input(s): "HGBA1C" in the last 72 hours. CBG: Recent Labs  Lab 12/08/23 0759 12/08/23 1127 12/08/23 1602 12/08/23 2111 12/09/23 0732  GLUCAP 137* 143* 106* 194* 128*   Lipid Profile: No results for input(s): "CHOL", "HDL", "LDLCALC", "TRIG", "CHOLHDL", "LDLDIRECT" in the last 72 hours. Thyroid  Function Tests: Recent Labs    12/06/23 2358  TSH 1.525  FREET4 0.51*   Anemia Panel: Recent Labs    12/06/23 2358  VITAMINB12 1,390*  FOLATE 6.2  FERRITIN 133  TIBC 231*  IRON 70  RETICCTPCT 1.3   Sepsis Labs: Recent Labs  Lab  12/06/23 2148 12/06/23 2358 12/07/23 0148 12/07/23 0617 12/07/23 0928  PROCALCITON  --  <0.10  --   --   --   LATICACIDVEN 3.0*  --  3.3* 0.9 0.9    Recent Results (from the past 240 hours)  Blood culture (routine x 2)     Status: None (Preliminary result)   Collection Time: 12/06/23  9:52 PM   Specimen: BLOOD LEFT WRIST  Result Value Ref Range Status   Specimen Description BLOOD LEFT WRIST  Final   Special Requests   Final    BOTTLES DRAWN AEROBIC AND ANAEROBIC Blood Culture adequate volume   Culture   Final    NO GROWTH 3 DAYS Performed at [facility], [address] [place], [place] [address]    Report Status PENDING  Incomplete  Blood culture (routine x 2)     Status: None (Preliminary result)   Collection Time: 12/06/23 10:05 PM   Specimen: BLOOD RIGHT ARM  Result Value Ref Range Status   Specimen Description BLOOD RIGHT ARM  Final   Special Requests   Final    BOTTLES DRAWN AEROBIC AND ANAEROBIC Blood Culture adequate volume   Culture   Final    NO GROWTH 3 DAYS Performed at [facility], [address] [place], [place] [address]    Report Status PENDING  Incomplete  Urine Culture     Status: Abnormal   Collection Time: 12/06/23 10:31 PM   Specimen: Urine, Random  Result Value Ref Range Status   Specimen Description   Final    URINE, RANDOM Performed at [facility], [address], [place], [place] [address]    Special Requests   Final    NONE Reflexed from [id number] Performed at [facility], [address] [place], [place] [address]    Culture >=100,000 COLONIES/mL ESCHERICHIA COLI (A)  Final   Report Status 12/09/2023 FINAL  Final   Organism ID, Bacteria ESCHERICHIA COLI (A)  Final      Susceptibility   Escherichia coli - MIC*    AMPICILLIN >=32 RESISTANT Resistant     CEFAZOLIN <=4 SENSITIVE Sensitive     CEFEPIME <=0.12 SENSITIVE Sensitive     CEFTRIAXONE <=0.25 SENSITIVE Sensitive     CIPROFLOXACIN  <=0.25  SENSITIVE Sensitive     GENTAMICIN <=1 SENSITIVE Sensitive     IMIPENEM <=0.25 SENSITIVE Sensitive     NITROFURANTOIN <=16 SENSITIVE Sensitive     TRIMETH /SULFA  <=20 SENSITIVE Sensitive     AMPICILLIN/SULBACTAM 8 SENSITIVE Sensitive  PIP/TAZO <=4 SENSITIVE Sensitive ug/mL    * >=100,000 COLONIES/mL ESCHERICHIA COLI         Radiology Studies: No results found.       Scheduled Meds:  [MAR Hold] atorvastatin   80 mg Oral Daily   [MAR Hold] citalopram   10 mg Oral Daily   [MAR Hold] cyanocobalamin  1,000 mcg Oral Daily   [MAR Hold] donepezil   5 mg Oral Daily   [MAR Hold] gabapentin   300 mg Oral TID   [MAR Hold] insulin  aspart  0-5 Units Subcutaneous QHS   [MAR Hold] insulin  aspart  0-9 Units Subcutaneous TID WC   [MAR Hold] multivitamin with minerals  1 tablet Oral Daily   [MAR Hold] oxybutynin   10 mg Oral Daily   [MAR Hold] pantoprazole (PROTONIX) IV  40 mg Intravenous Q12H   [MAR Hold] tamsulosin   0.4 mg Oral QPC supper   Continuous Infusions:  [MAR Hold] cefTRIAXone (ROCEPHIN)  IV 1 g (12/09/23 1012)     LOS: 3 days  Tiajuana Fluke, MD [facility]   If 7PM-7AM, please contact night-coverage  12/09/2023, 2:44 PM

## 2023-12-09 NOTE — Plan of Care (Signed)

## 2023-12-09 NOTE — Progress Notes (Signed)
 The patient underwent an EGD with 1 single erosion seen in the stomach.  The small bowel did not show any abnormalities and there is no sign of any active or recent bleeding.  The patient can restart his Plavix .  Not planning any further GI workup at this time.  The patient's iron studies were normal.  I will sign off.  Please call if any further GI concerns or questions.  We would like to thank you for the opportunity to participate in the care of [name].

## 2023-12-10 DIAGNOSIS — E870 Hyperosmolality and hypernatremia: Secondary | ICD-10-CM | POA: Diagnosis not present

## 2023-12-10 DIAGNOSIS — N3000 Acute cystitis without hematuria: Secondary | ICD-10-CM | POA: Diagnosis not present

## 2023-12-10 DIAGNOSIS — D61818 Other pancytopenia: Secondary | ICD-10-CM | POA: Diagnosis not present

## 2023-12-10 DIAGNOSIS — A419 Sepsis, unspecified organism: Secondary | ICD-10-CM | POA: Diagnosis not present

## 2023-12-10 LAB — GLUCOSE, CAPILLARY
Glucose-Capillary: 91 mg/dL (ref 70–99)
Glucose-Capillary: 163 mg/dL — ABNORMAL HIGH (ref 70–99)
Glucose-Capillary: 221 mg/dL — ABNORMAL HIGH (ref 70–99)
Glucose-Capillary: 97 mg/dL (ref 70–99)

## 2023-12-10 LAB — CBC WITH DIFFERENTIAL/PLATELET
Abs Immature Granulocytes: 0.01 10*3/uL (ref 0.00–0.07)
Basophils Absolute: 0 10*3/uL (ref 0.0–0.1)
Basophils Relative: 1 %
Eosinophils Absolute: 0.1 10*3/uL (ref 0.0–0.5)
Eosinophils Relative: 6 %
HCT: 37.9 % — ABNORMAL LOW (ref 39.0–52.0)
Hemoglobin: 11.6 g/dL — ABNORMAL LOW (ref 13.0–17.0)
Immature Granulocytes: 1 %
Lymphocytes Relative: 31 %
Lymphs Abs: 0.7 10*3/uL (ref 0.7–4.0)
MCH: 28.4 pg (ref 26.0–34.0)
MCHC: 30.6 g/dL (ref 30.0–36.0)
MCV: 92.7 fL (ref 80.0–100.0)
Monocytes Absolute: 0.2 10*3/uL (ref 0.1–1.0)
Monocytes Relative: 9 %
Neutro Abs: 1.1 10*3/uL — ABNORMAL LOW (ref 1.7–7.7)
Neutrophils Relative %: 52 %
Platelets: 120 10*3/uL — ABNORMAL LOW (ref 150–400)
RBC: 4.09 MIL/uL — ABNORMAL LOW (ref 4.22–5.81)
RDW: 14.7 % (ref 11.5–15.5)
WBC: 2.1 10*3/uL — ABNORMAL LOW (ref 4.0–10.5)
nRBC: 0.9 % — ABNORMAL HIGH (ref 0.0–0.2)

## 2023-12-10 LAB — BASIC METABOLIC PANEL WITH GFR
Anion gap: 7 (ref 5–15)
BUN: 13 mg/dL (ref 6–20)
CO2: 30 mmol/L (ref 22–32)
Calcium: 8.9 mg/dL (ref 8.9–10.3)
Chloride: 107 mmol/L (ref 98–111)
Creatinine, Ser: 1.15 mg/dL (ref 0.61–1.24)
GFR, Estimated: >60 mL/min (ref >60)
Glucose, Bld: 95 mg/dL (ref 70–99)
Potassium: 4 mmol/L (ref 3.5–5.1)
Sodium: 144 mmol/L (ref 135–145)

## 2023-12-10 LAB — PATHOLOGIST SMEAR REVIEW

## 2023-12-10 NOTE — Plan of Care (Signed)

## 2023-12-10 NOTE — Progress Notes (Signed)
 Physical Therapy Treatment Patient Details Name: [name] MRN: [id number] DOB: 10-Mar-1971 Today's Date: 12/10/2023   History of Present Illness Pt is a 53 y.o. male presenting to hospital 12/06/23 with c/o falls and AMS; c/o R knee pain after recent fall (but has been able to walk afterwards).  Pt admitted with severe sepsis d/t UTI, GI bleeding and pancytopenia, thrombocytopenia, hypernatremia, htn, abnormal LFT's.  PMH includes stroke, community hydrocephalus, possible mild dementia, htn, HLD, DM, diastolic CHF, depression, BPH, seizures.    PT Comments  Pt received upright in bed agreeable to PT and OT co-treat. Pt remains needing 2 person assist for gait strictly to assist with Line/lead management. Overall pt needed CGA to minA for transfers and gait 40' today for RW management. Pt particularly off balance with turns needing regular VC's and minA for sequencing RW with turning feet. Also requiring VC's keeping RW closer to BOS to reduced anterior forward lean. Pt becomes evidently fatigued with worsening LOB laterally prior to sitting in recliner. Pt left in recliner with lunch tray. D/c recs remain appropriate.    If plan is discharge home, recommend the following: Two people to help with walking and/or transfers;A lot of help with bathing/dressing/bathroom;Assistance with cooking/housework;Assist for transportation;Help with stairs or ramp for entrance   Can travel by private vehicle     No  Equipment Recommendations  Rolling walker (2 wheels);BSC/3in1    Recommendations for Other Services       Precautions / Restrictions Precautions Precautions: Fall Recall of Precautions/Restrictions: Impaired Restrictions Weight Bearing Restrictions Per Provider Order: No     Mobility  Bed Mobility Overal bed mobility: Needs Assistance Bed Mobility: Supine to Sit     Supine to sit: Supervision, HOB elevated       Patient Response: Cooperative, Flat affect  Transfers Overall  transfer level: Needs assistance Equipment used: Rolling walker (2 wheels) Transfers: Sit to/from Stand Sit to Stand: Min assist                Ambulation/Gait Ambulation/Gait assistance: Contact guard assist, Min assist, +2 safety/equipment Gait Distance (Feet): 40 Feet Assistive device: Rolling walker (2 wheels) Gait Pattern/deviations: Decreased step length - right, Decreased step length - left       General Gait Details: Pt remains ataxic with LE's. Less posterior lean today and needs VC's for wider BOS. MInA mainly needed with turning due to difficulty sequencing RW with steps. 2 person assist more for line/lead mgmt so one person could focus on pt and DME   Stairs             Wheelchair Mobility     Tilt Bed Tilt Bed Patient Response: Cooperative, Flat affect  Modified Rankin (Stroke Patients Only)       Balance Overall balance assessment: Needs assistance Sitting-balance support: No upper extremity supported, Feet supported Sitting balance-Leahy Scale: Fair     Standing balance support: Bilateral upper extremity supported, Reliant on assistive device for balance Standing balance-Leahy Scale: Poor Standing balance comment: better neutral standing but still with PRN posterior LOB needing CGA to MinA to correct.                            Communication Communication Communication: Impaired Factors Affecting Communication: Difficulty expressing self  Cognition Arousal: Alert Behavior During Therapy: Flat affect   PT - Cognitive impairments: No family/caregiver present to determine baseline  Following commands: Impaired Following commands impaired: Follows one step commands with increased time    Cueing Cueing Techniques: Verbal cues  Exercises      General Comments        Pertinent Vitals/Pain Pain Assessment Pain Assessment: No/denies pain    Home Living                           Prior Function            PT Goals (current goals can now be found in the care plan section) Acute Rehab PT Goals Patient Stated Goal: to improve walking PT Goal Formulation: With patient Time For Goal Achievement: 12/22/23 Potential to Achieve Goals: Good Progress towards PT goals: Progressing toward goals    Frequency    Min 2X/week      PT Plan      Co-evaluation PT/OT/SLP Co-Evaluation/Treatment: Yes Reason for Co-Treatment: Complexity of the patient's impairments (multi-system involvement);For patient/therapist safety PT goals addressed during session: Mobility/safety with mobility;Balance;Proper use of DME OT goals addressed during session: Proper use of Adaptive equipment and DME;ADL's and self-care      AM-PAC PT "6 Clicks" Mobility   Outcome Measure  Help needed turning from your back to your side while in a flat bed without using bedrails?: A Little Help needed moving from lying on your back to sitting on the side of a flat bed without using bedrails?: A Little Help needed moving to and from a bed to a chair (including a wheelchair)?: A Lot Help needed standing up from a chair using your arms (e.g., wheelchair or bedside chair)?: A Little Help needed to walk in hospital room?: A Lot Help needed climbing 3-5 steps with a railing? : Total 6 Click Score: 14    End of Session Equipment Utilized During Treatment: Gait belt Activity Tolerance: Patient tolerated treatment well Patient left: in chair;with call bell/phone within reach;with chair alarm set Nurse Communication: Mobility status;Precautions PT Visit Diagnosis: Unsteadiness on feet (R26.81);Other abnormalities of gait and mobility (R26.89);Muscle weakness (generalized) (M62.81);History of falling (Z91.81);Other symptoms and signs involving the nervous system (R29.898)     Time: 1610-9604 PT Time Calculation (min) (ACUTE ONLY): 18 min  Charges:    $Gait Training: 8-22 mins PT General Charges $$  ACUTE PT VISIT: 1 Visit                     Marc Senior. Fairly IV, PT, DPT Physical Therapist- [place]  [facility]  12/10/2023, 4:10 PM

## 2023-12-10 NOTE — Plan of Care (Signed)

## 2023-12-10 NOTE — NC FL2 (Signed)
 [place]  MEDICAID FL2 LEVEL OF CARE FORM     IDENTIFICATION  Patient Name: [name] Birthdate: [birth year] Sex: male Admission Date (Current Location): 12/06/2023  [facility] and [place] Number:  Chiropodist and Address:  [facility], [address] [place], [place] [address]      Provider Number: [id number]  Attending Physician Name and Address:  [name], MD  Relative Name and Phone Number:       Current Level of Care: Hospital Recommended Level of Care: Skilled Nursing Facility Prior Approval Number:    Date Approved/Denied:   PASRR Number: [id number]  Discharge Plan: SNF    Current Diagnoses: Patient Active Problem List   Diagnosis Date Noted   GI bleeding 12/07/2023   Severe sepsis (HCC) 12/07/2023   UTI (urinary tract infection) 12/07/2023   Thrombocytopenia (HCC) 12/07/2023   Pancytopenia (HCC) 12/06/2023   Chronic diastolic CHF (congestive heart failure) (HCC) 12/06/2023   Depression 12/06/2023   Hypothermia 12/06/2023   Fall at home, initial encounter 12/06/2023   Hypernatremia 12/06/2023   Abnormal LFTs 12/06/2023   BPH (benign prostatic hyperplasia) 12/06/2023   Diabetes mellitus treated with oral medication (HCC) 10/25/2023   Pain due to onychomycosis of toenails of both feet 11/15/2021   Poor mobility 09/06/2020   Type 2 diabetes mellitus with proteinuria (HCC) 12/31/2019   Full incontinence of feces 12/10/2019   Urge incontinence of urine 12/10/2019   Chronic communicating hydrocephalus (HCC) 04/22/2019   Vitamin B12 deficiency 12/02/2018   Chronic pain of right ankle 12/18/2015   Obesity 10/16/2015   Hyperlipidemia associated with type 2 diabetes mellitus (HCC) 09/13/2015   History of stroke 12/17/2013   Hypertension associated with diabetes (HCC) 12/17/2013   Cardiomyopathy (HCC) 12/17/2013   Type 2 diabetes mellitus with diabetic polyneuropathy (HCC) 12/17/2013    Orientation RESPIRATION BLADDER  Height & Weight     Self, Situation, Place  Normal Continent Weight: 236 lb 8.9 oz (107.3 kg) Height:  6\' 1"  (185.4 cm)  BEHAVIORAL SYMPTOMS/MOOD NEUROLOGICAL BOWEL NUTRITION STATUS   (None)  (History of stroke) Continent Diet (Full liquids)  AMBULATORY STATUS COMMUNICATION OF NEEDS Skin   Limited Assist Verbally Skin abrasions                       Personal Care Assistance Level of Assistance  Bathing, Feeding, Dressing Bathing Assistance: Limited assistance Feeding assistance: Limited assistance Dressing Assistance: Limited assistance     Functional Limitations Info  Sight, Speech, Hearing Sight Info: Adequate Hearing Info: Adequate Speech Info: Impaired (Slurred/dysarthria)    SPECIAL CARE FACTORS FREQUENCY  PT (By licensed PT), OT (By licensed OT)     PT Frequency: 5 x week OT Frequency: 5 x week            Contractures Contractures Info: Not present    Additional Factors Info  Code Status, Allergies Code Status Info: Full code Allergies Info: NKDA           Current Medications (12/10/2023):  This is the current hospital active medication list Current Facility-Administered Medications  Medication Dose Route Frequency Provider Last Rate Last Admin   acetaminophen  (TYLENOL ) tablet 325 mg  325 mg Oral Q6H PRN Niu, Xilin, MD       atorvastatin  (LIPITOR ) tablet 80 mg  80 mg Oral Daily Niu, Xilin, MD   80 mg at 12/10/23 0849   cefTRIAXone (ROCEPHIN) 1 g in sodium chloride  0.9 % 100 mL IVPB  1 g  Intravenous Q24H Sreenath, Sudheer B, MD 200 mL/hr at 12/10/23 0850 1 g at 12/10/23 0850   citalopram  (CELEXA ) tablet 10 mg  10 mg Oral Daily Niu, Xilin, MD   10 mg at 12/10/23 0849   cyanocobalamin (VITAMIN B12) tablet 1,000 mcg  1,000 mcg Oral Daily Niu, Xilin, MD   1,000 mcg at 12/10/23 1610   donepezil  (ARICEPT ) tablet 5 mg  5 mg Oral Daily Niu, Xilin, MD   5 mg at 12/10/23 9604   gabapentin  (NEURONTIN ) capsule 300 mg  300 mg Oral TID Niu, Xilin, MD   300 mg at  12/10/23 5409   hydrALAZINE  (APRESOLINE ) injection 5 mg  5 mg Intravenous Q2H PRN Niu, Xilin, MD       insulin  aspart (novoLOG ) injection 0-5 Units  0-5 Units Subcutaneous QHS Niu, Xilin, MD       insulin  aspart (novoLOG ) injection 0-9 Units  0-9 Units Subcutaneous TID WC Niu, Xilin, MD   2 Units at 12/09/23 1808   multivitamin with minerals tablet 1 tablet  1 tablet Oral Daily Niu, Xilin, MD   1 tablet at 12/10/23 8119   ondansetron  (ZOFRAN ) injection 4 mg  4 mg Intravenous Q8H PRN Niu, Xilin, MD       oxybutynin  (DITROPAN -XL) 24 hr tablet 10 mg  10 mg Oral Daily Niu, Xilin, MD   10 mg at 12/10/23 1478   oxyCODONE (Oxy IR/ROXICODONE) immediate release tablet 5 mg  5 mg Oral Q6H PRN Niu, Xilin, MD       pantoprazole (PROTONIX) EC tablet 40 mg  40 mg Oral Daily Sreenath, Sudheer B, MD   40 mg at 12/10/23 2956   tamsulosin  (FLOMAX ) capsule 0.4 mg  0.4 mg Oral QPC supper Niu, Xilin, MD   0.4 mg at 12/09/23 2130     Discharge Medications: Please see discharge summary for a list of discharge medications.  Relevant Imaging Results:  Relevant Lab Results:   Additional Information SS#: [id number]  Odilia Bennett, LCSW

## 2023-12-10 NOTE — TOC Initial Note (Addendum)
 Transition of Care [facility]) - Initial/Assessment Note    Patient Details  Name: [name] MRN: [id number] Date of Birth: [birth year]  [facility] CM/SW Contact:    Odilia Bennett, LCSW Phone Number: 12/10/2023, 10:18 AM  Clinical Narrative:   CSW met with patient. No family at bedside. CSW introduced role and explained that therapy recommendations would be discussed. Patient is agreeable to SNF placement. He gave CSW permission to call his brother. CSW spoke to brother and provided update. He is agreeable to plan as well. No further concerns. CSW will continue to follow patient and his brother for support and facilitate discharge to SNF once medically stable.            4:10 pm: CSW provided printout of SNF bed offers with star ratings so patient can review it tonight.       Barriers to Discharge: Continued Medical Work up   Patient Goals and CMS Choice            Expected Discharge Plan and Services     Post Acute Care Choice: Skilled Nursing Facility Living arrangements for the past 2 months: Apartment                                      Prior Living Arrangements/Services Living arrangements for the past 2 months: Apartment Lives with:: Siblings Patient language and need for interpreter reviewed:: Yes Do you feel safe going back to the place where you live?: Yes      Need for Family Participation in Patient Care: Yes (Comment) Care giver support system in place?: Yes (comment)   Criminal Activity/Legal Involvement Pertinent to Current Situation/Hospitalization: No - Comment as needed  Activities of Daily Living   ADL Screening (condition at time of admission) Is the patient deaf or have difficulty hearing?: No Does the patient have difficulty seeing, even when wearing glasses/contacts?: No Does the patient have difficulty concentrating, remembering, or making decisions?: No  Permission Sought/Granted Permission sought to share information  with : Facility Medical sales representative, Family Supports Permission granted to share information with : Yes, Verbal Permission Granted  Share Information with NAME: Hearl Heikes  Permission granted to share info w AGENCY: SNF's  Permission granted to share info w Relationship: Brother  Permission granted to share info w Contact Information: [phone]  Emotional Assessment Appearance:: Appears stated age Attitude/Demeanor/Rapport: Engaged, Gracious Affect (typically observed): Accepting, Appropriate, Calm, Pleasant Orientation: : Oriented to Self, Oriented to Place, Oriented to  Time, Oriented to Situation Alcohol / Substance Use: Not Applicable Psych Involvement: No (comment)  Admission diagnosis:  SIRS (systemic inflammatory response syndrome) (HCC) [R65.10] Patient Active Problem List   Diagnosis Date Noted   GI bleeding 12/07/2023   Severe sepsis (HCC) 12/07/2023   UTI (urinary tract infection) 12/07/2023   Thrombocytopenia (HCC) 12/07/2023   Pancytopenia (HCC) 12/06/2023   Chronic diastolic CHF (congestive heart failure) (HCC) 12/06/2023   Depression 12/06/2023   Hypothermia 12/06/2023   Fall at home, initial encounter 12/06/2023   Hypernatremia 12/06/2023   Abnormal LFTs 12/06/2023   BPH (benign prostatic hyperplasia) 12/06/2023   Diabetes mellitus treated with oral medication (HCC) 10/25/2023   Pain due to onychomycosis of toenails of both feet 11/15/2021   Poor mobility 09/06/2020   Type 2 diabetes mellitus with proteinuria (HCC) 12/31/2019   Full incontinence of feces 12/10/2019   Urge incontinence of urine 12/10/2019  Chronic communicating hydrocephalus (HCC) 04/22/2019   Vitamin B12 deficiency 12/02/2018   Chronic pain of right ankle 12/18/2015   Obesity 10/16/2015   Hyperlipidemia associated with type 2 diabetes mellitus (HCC) 09/13/2015   History of stroke 12/17/2013   Hypertension associated with diabetes (HCC) 12/17/2013   Cardiomyopathy (HCC) 12/17/2013    Type 2 diabetes mellitus with diabetic polyneuropathy (HCC) 12/17/2013   PCP:  [name], NP Pharmacy:   [facility],  - [address] [place] [place] [address] Phone: [phone] Fax: [phone]  [facility] - [place], [place] - [address] [facility] AT Portal to Registered [facility] [address] [place] [address] Phone: [phone] Fax: [phone]     Social Drivers of Health (SDOH) Social History: SDOH Screenings   Food Insecurity: No Food Insecurity (12/07/2023)  Housing: Low Risk  (12/07/2023)  Transportation Needs: No Transportation Needs (12/07/2023)  Utilities: Not At Risk (12/07/2023)  Alcohol Screen: Low Risk  (07/12/2022)  Depression (PHQ2-9): Low Risk  (12/30/2022)  Financial Resource Strain: Low Risk  (07/12/2022)  Physical Activity: Inactive (07/12/2022)  Social Connections: Socially Isolated (07/12/2022)  Stress: No Stress Concern Present (07/12/2022)  Tobacco Use: Medium Risk (12/09/2023)   SDOH Interventions:     Readmission Risk Interventions     No data to display

## 2023-12-10 NOTE — Progress Notes (Signed)
 Occupational Therapy Treatment Patient Details Name: [name] MRN: [id number] DOB: 06/16/1971 Today's Date: 12/10/2023   History of present illness Pt is a 53 y.o. male presenting to hospital 12/06/23 with c/o falls and AMS; c/o R knee pain after recent fall (but has been able to walk afterwards).  Pt admitted with severe sepsis d/t UTI, GI bleeding and pancytopenia, thrombocytopenia, hypernatremia, htn, abnormal LFT's.  PMH includes stroke, community hydrocephalus, possible mild dementia, htn, HLD, DM, diastolic CHF, depression, BPH, seizures.   OT comments  Pt is supine in bed on arrival. Pleasant and agreeable to PT/OT co-tx session to maximize pt/therapist safety with ambulation progression. He denies pain. Pt performed bed mobility with supervision from semi-supine position with mild increased effort.  Pt remains ataxic and a high fall risk with all transfers and mobility. Min A x1 for STS from EOB to RW, progressing mobility to CGA x1 and CGA/SBA x1 for lines/leads management to maximize safety for ~50 feet of ambulation. He  does require Min A for safety during turns d/t imbalance. Pt's lunch tray arrived and he wished to eat d/t it being late. Set up assist for self feeding. Left in recliner with chair alarm on and all needs in place and will cont to require skilled acute OT services to maximize his safety and IND to return to PLOF.        If plan is discharge home, recommend the following:  A lot of help with walking and/or transfers;A lot of help with bathing/dressing/bathroom;Help with stairs or ramp for entrance   Equipment Recommendations  Other (comment) (defer)    Recommendations for Other Services      Precautions / Restrictions Precautions Precautions: Fall Recall of Precautions/Restrictions: Impaired Restrictions Weight Bearing Restrictions Per Provider Order: No       Mobility Bed Mobility Overal bed mobility: Needs Assistance Bed Mobility: Supine to Sit      Supine to sit: Supervision, HOB elevated     General bed mobility comments: increased effort, HOB siginficantly elevated    Transfers Overall transfer level: Needs assistance Equipment used: Rolling walker (2 wheels) Transfers: Sit to/from Stand Sit to Stand: Min assist           General transfer comment: Min A x1 for STS from EOB to RW; ambulated ~40-50 feet using RW with CGA x1 and SBA x1 for lines/leads management and Min A x1 for turns with RW, notable ataxia     Balance Overall balance assessment: Needs assistance Sitting-balance support: No upper extremity supported, Feet supported Sitting balance-Leahy Scale: Fair     Standing balance support: Bilateral upper extremity supported, Reliant on assistive device for balance Standing balance-Leahy Scale: Poor Standing balance comment: static standing balance improved, although still initial post lean that improves with times, CGA to Min A for mobility                           ADL either performed or assessed with clinical judgement   ADL Overall ADL's : Needs assistance/impaired Eating/Feeding: Set up;Sitting Eating/Feeding Details (indicate cue type and reason): able to self feed seated in recliner at end of session                                 Functional mobility during ADLs: Minimal assistance;Contact guard assist;Rolling walker (2 wheels);+2 for safety/equipment General ADL Comments: CGA x1 and SBA x1 for lines/leads  management for mobility, needed Min A x1 for turns with RW d/t instability, notable ataxia    Extremity/Trunk Assessment              Vision       Perception     Praxis     Communication Communication Communication: Impaired Factors Affecting Communication: Difficulty expressing self   Cognition Arousal: Alert Behavior During Therapy: Flat affect                                 Following commands: Impaired Following commands impaired: Follows  one step commands with increased time      Cueing   Cueing Techniques: Verbal cues  Exercises      Shoulder Instructions       General Comments      Pertinent Vitals/ Pain       Pain Assessment Pain Assessment: No/denies pain  Home Living                                          Prior Functioning/Environment              Frequency  Min 2X/week        Progress Toward Goals  OT Goals(current goals can now be found in the care plan section)  Progress towards OT goals: Progressing toward goals  Acute Rehab OT Goals Patient Stated Goal: get better OT Goal Formulation: With patient Time For Goal Achievement: 12/22/23 Potential to Achieve Goals: Fair  Plan      Co-evaluation    PT/OT/SLP Co-Evaluation/Treatment: Yes Reason for Co-Treatment: Complexity of the patient's impairments (multi-system involvement);For patient/therapist safety PT goals addressed during session: Mobility/safety with mobility;Balance;Proper use of DME OT goals addressed during session: Proper use of Adaptive equipment and DME;ADL's and self-care      AM-PAC OT "6 Clicks" Daily Activity     Outcome Measure   Help from another person eating meals?: None Help from another person taking care of personal grooming?: A Little Help from another person toileting, which includes using toliet, bedpan, or urinal?: A Lot Help from another person bathing (including washing, rinsing, drying)?: A Lot Help from another person to put on and taking off regular upper body clothing?: A Little Help from another person to put on and taking off regular lower body clothing?: A Lot 6 Click Score: 16    End of Session Equipment Utilized During Treatment: Gait belt;Rolling walker (2 wheels)  OT Visit Diagnosis: Other abnormalities of gait and mobility (R26.89);Unsteadiness on feet (R26.81);Muscle weakness (generalized) (M62.81)   Activity Tolerance Patient tolerated treatment well    Patient Left in chair;with call bell/phone within reach;with chair alarm set;with nursing/sitter in room   Nurse Communication Mobility status        Time: 9147-8295 OT Time Calculation (min): 23 min  Charges: OT General Charges $OT Visit: 1 Visit OT Treatments $Therapeutic Activity: 8-22 mins  Matti Minney, OTR/L  12/10/23, 4:15 PM   [place] [address] Gailene Youkhana 12/10/2023, 4:10 PM

## 2023-12-10 NOTE — Progress Notes (Signed)
 PROGRESS NOTE    [name]  HYQ:[id number] DOB: Jun 10, 1971 DOA: 12/06/2023 PCP: [name], NP    Brief Narrative:   53 y.o. male with medical history significant of stroke, community hydrocephalus, possible mild dementia on donepezil , hypertension, hyperlipidemia, diabetes mellitus, diastolic CHF, depression, BPH, who presents with fall, dark stool, weakness.   Patient states he has been feeling weak recently, and has had multiple falls recently, with last fall happened this morning.  No LOC.  No significant injury.  He complains of right knee pain, which seem to be a chronic issue.  Patient talks slowly, but clear in talking.  Per reported, patient had right-sided weakness from previous stroke, but on my examination, patient's muscle strength in extremities seem to be normal.  No facial droop or slurred speech.  Patient denies chest pain, cough, SOB.  No nausea, vomiting, diarrhea or abdominal pain.  He reports intermittent dark stool for about 1 month.  No fever or chills.  Denies dysuria, hematuria or burning with urination.  6/4: Advanced diet to regular.  PT and OT eval and SNF placement in process  Assessment & Plan:   Principal Problem:   Severe sepsis (HCC) Active Problems:   UTI (urinary tract infection)   GI bleeding   Pancytopenia (HCC)   Hypernatremia   Thrombocytopenia (HCC)   Hypertension associated with diabetes (HCC)   Hyperlipidemia associated with type 2 diabetes mellitus (HCC)   Abnormal LFTs   Type 2 diabetes mellitus with diabetic polyneuropathy (HCC)   Chronic diastolic CHF (congestive heart failure) (HCC)   History of stroke   Chronic communicating hydrocephalus (HCC)   BPH (benign prostatic hyperplasia)   Fall at home, initial encounter   Depression   Hypothermia  Severe sepsis due to UTI (urinary tract infection)  Sepsis due to UTI. Pt meets criteria for severe sepsis with WBC <3.5 (which is < 4.0), hypothermia with temperature 91.1.  Lactic  acid 3.1 --> 3.0.  Blood pressure is soft, but hemodynamically stable.  Pt has hx of positive urine culture for Acinetobacter Baumanni which is intermittently susceptible to ceftriaxone.  Urine culture positive for E. coli Plan: Continue Rocephin for now Start ciprofloxacin  at discharge   GI bleeding and pancytopenia [facility]):  Hgb 14.1 --> 11.6  Patient has intermittent dark stool for about a month.  Pt is taking ASA and Plavix . EGD today, gastric erosions noted No further GI follow-up Plan: Continue to hold DAPT aspirin  and Plavix  for today Check a.m. hemoglobin, if stable restart DAPT at DC Full liquid diet tolerated, advanced diet to regular today P.o. PPI daily  Thrombocytopenia:  Monitor.  No indication for transfusion   Hypernatremia:  Na 150.  Did not respond to half-normal saline.  Did respond to D5 water Initially improved to 142, subsequently worsened to 147 -> 144 today Plan: Encourage p.o. fluid intake   Hypertension associated with diabetes (HCC):  BP starting to go up.  Antihypertensives have been held throughout the course of admission. Plan: hold amlodipine , Coreg , lisinopril ..  Continue as needed IV hydralazine .  If blood pressure stable consider restarting home medication at DC tomorrow   Hyperlipidemia associated with type 2 diabetes mellitus (HCC) - Continue statin   Abnormal LFTs: Mild.  ALP 70, AST 63, ALT 77, total bilirubin 0.4.  Possibly due to severe sepsis. - Low-dose Tylenol  as needed - Hepatitis panel negative.   Type 2 diabetes mellitus with diabetic polyneuropathy (HCC): Recent A1c 5.5, well-controlled.  Patient is taking metformin  - Hold  metformin .  SSI for now   Chronic diastolic CHF (congestive heart failure) (HCC): 2D echo on 09/05/2020 showed EF of 50-55% with grade 2 diastolic dysfunction.  Patient has 1+ leg edema, no SOB, no oxygen  desaturation, does not seem to have CHF exacerbation.  Continue monitor   History of stroke - Continue to  hold aspirin , Plavix .  Consider restarting/for - Continue Lipitor    Chronic communicating hydrocephalus [facility]): Patient seems to have mild cognitive impairment. - Continue donepezil    BPH (benign prostatic hyperplasia) - Continue Flomax , oxybutynin    Fall at home, initial encounter - Continue fall precaution - PT/OT working with him - Recommendation for skilled nursing facility at time of discharge   Depression - Continue Celexa    Hypothermia Resolved.  Vitals per unit protocol   DVT prophylaxis: SCD Code Status: Full Family Communication: Brother via phone 6/1 Disposition Plan: Status is: Inpatient Remains inpatient appropriate because: Multiple acute issues as above. Advanced diet today, possible D/C to SNF tomorrow   Level of care: Progressive  Consultants:  GI  Procedures:  EGD  Antimicrobials: Ceftriaxone   Subjective: Seen and examined.  Sitting in the chair.  Appreciative of his care and requesting to advance the diet, wants to drink soda/coke  Objective: Vitals:   12/10/23 0825 12/10/23 1124 12/10/23 1657 12/10/23 1933  BP: (!) 145/97 (!) 145/91 (!) 139/95 (!) 138/91  Pulse: 63 60 62 72  Resp:    18  Temp: (!) 97.4 F (36.3 C) (!) 97.4 F (36.3 C) (!) 97.5 F (36.4 C) 98 F (36.7 C)  TempSrc:      SpO2: 98% 98% 96% 98%  Weight:      Height:        Intake/Output Summary (Last 24 hours) at 12/10/2023 2107 Last data filed at 12/10/2023 1900 Gross per 24 hour  Intake 960 ml  Output 1520 ml  Net -560 ml   Filed Weights   12/08/23 0500 12/09/23 0522 12/10/23 0502  Weight: 119.9 kg 116 kg 107.3 kg    Examination:  General exam: No acute distress Respiratory system: Lungs clear.  Normal work of breathing.  Room air Cardiovascular system: S1-2, RRR, no murmurs, no pedal edema Gastrointestinal system:, NT/ND, normal bowel sounds Central nervous system: Alert and oriented x 2.  Dysarthric speech Extremities: Symmetric 5 x 5 power. Skin: No  rashes, lesions or ulcers Psychiatry: Judgement and insight appear impaired. Mood & affect confused.     Data Reviewed: I have personally reviewed following labs and imaging studies  CBC: Recent Labs  Lab 12/06/23 1940 12/06/23 2358 12/07/23 0617 12/07/23 0928 12/08/23 0954 12/09/23 1000 12/10/23 0509  WBC 3.5*   < > 3.0* 2.9* 2.7* 1.9* 2.1*  NEUTROABS 2.0  --   --   --  1.5* 0.8* 1.1*  HGB 11.8*   < > 10.9* 11.1* 11.3* 11.8* 11.6*  HCT 38.7*   < > 34.9* 36.1* 36.0* 36.9* 37.9*  MCV 95.8   < > 94.3 94.3 93.0 91.6 92.7  PLT 118*   < > 104* 104* 109* 109* 120*   < > = values in this interval not displayed.   Basic Metabolic Panel: Recent Labs  Lab 12/06/23 2358 12/07/23 0928 12/08/23 0954 12/09/23 1000 12/10/23 0509  NA 150* 149* 142 147* 144  K 3.9 4.0 4.0 4.2 4.0  CL 116* 114* 107 113* 107  CO2 24 28 27 26 30   GLUCOSE 81 71 197* 112* 95  BUN 35* 30* 22* 14 13  CREATININE  1.33* 1.32* 1.34* 1.19 1.15  CALCIUM  8.3* 8.3* 8.3* 8.7* 8.9   GFR: Estimated Creatinine Clearance: 97.7 mL/min (by C-G formula based on SCr of 1.15 mg/dL). Liver Function Tests: Recent Labs  Lab 12/06/23 1940  AST 63*  ALT 77*  ALKPHOS 70  BILITOT 0.4  PROT 7.2  ALBUMIN 3.8   No results for input(s): "LIPASE", "AMYLASE" in the last 168 hours. No results for input(s): "AMMONIA" in the last 168 hours. Coagulation Profile: Recent Labs  Lab 12/06/23 2358  INR 1.3*   Cardiac Enzymes: Recent Labs  Lab 12/06/23 2358  CKTOTAL 75   BNP (last 3 results) No results for input(s): "PROBNP" in the last 8760 hours. HbA1C: No results for input(s): "HGBA1C" in the last 72 hours. CBG: Recent Labs  Lab 12/09/23 2025 12/10/23 0825 12/10/23 1152 12/10/23 1558 12/10/23 2102  GLUCAP 149* 91 163* 221* 97   Lipid Profile: No results for input(s): "CHOL", "HDL", "LDLCALC", "TRIG", "CHOLHDL", "LDLDIRECT" in the last 72 hours. Thyroid  Function Tests: No results for input(s): "TSH",  "T4TOTAL", "FREET4", "T3FREE", "THYROIDAB" in the last 72 hours.  Anemia Panel: No results for input(s): "VITAMINB12", "FOLATE", "FERRITIN", "TIBC", "IRON", "RETICCTPCT" in the last 72 hours.  Sepsis Labs: Recent Labs  Lab 12/06/23 2148 12/06/23 2358 12/07/23 0148 12/07/23 0617 12/07/23 0928  PROCALCITON  --  <0.10  --   --   --   LATICACIDVEN 3.0*  --  3.3* 0.9 0.9    Recent Results (from the past 240 hours)  Blood culture (routine x 2)     Status: None (Preliminary result)   Collection Time: 12/06/23  9:52 PM   Specimen: BLOOD LEFT WRIST  Result Value Ref Range Status   Specimen Description BLOOD LEFT WRIST  Final   Special Requests   Final    BOTTLES DRAWN AEROBIC AND ANAEROBIC Blood Culture adequate volume   Culture   Final    NO GROWTH 4 DAYS Performed at [facility], [address] [place], [place] [address]    Report Status PENDING  Incomplete  Blood culture (routine x 2)     Status: None (Preliminary result)   Collection Time: 12/06/23 10:05 PM   Specimen: BLOOD RIGHT ARM  Result Value Ref Range Status   Specimen Description BLOOD RIGHT ARM  Final   Special Requests   Final    BOTTLES DRAWN AEROBIC AND ANAEROBIC Blood Culture adequate volume   Culture   Final    NO GROWTH 4 DAYS Performed at [facility], [address], [place], [place] [address]    Report Status PENDING  Incomplete  Urine Culture     Status: Abnormal   Collection Time: 12/06/23 10:31 PM   Specimen: Urine, Random  Result Value Ref Range Status   Specimen Description   Final    URINE, RANDOM Performed at [facility], [address], [place], [place] [address]    Special Requests   Final    NONE Reflexed from [id number] Performed at [facility], [address] [place], [place] [address]    Culture >=100,000 COLONIES/mL ESCHERICHIA COLI (A)  Final   Report Status 12/09/2023 FINAL  Final   Organism ID, Bacteria ESCHERICHIA COLI (A)  Final       Susceptibility   Escherichia coli - MIC*    AMPICILLIN >=32 RESISTANT Resistant     CEFAZOLIN <=4 SENSITIVE Sensitive     CEFEPIME <=0.12 SENSITIVE Sensitive     CEFTRIAXONE <=0.25 SENSITIVE Sensitive  CIPROFLOXACIN  <=0.25 SENSITIVE Sensitive     GENTAMICIN <=1 SENSITIVE Sensitive     IMIPENEM <=0.25 SENSITIVE Sensitive     NITROFURANTOIN <=16 SENSITIVE Sensitive     TRIMETH /SULFA  <=20 SENSITIVE Sensitive     AMPICILLIN/SULBACTAM 8 SENSITIVE Sensitive     PIP/TAZO <=4 SENSITIVE Sensitive ug/mL    * >=100,000 COLONIES/mL ESCHERICHIA COLI         Radiology Studies: No results found.       Scheduled Meds:  atorvastatin   80 mg Oral Daily   citalopram   10 mg Oral Daily   cyanocobalamin  1,000 mcg Oral Daily   donepezil   5 mg Oral Daily   gabapentin   300 mg Oral TID   insulin  aspart  0-5 Units Subcutaneous QHS   insulin  aspart  0-9 Units Subcutaneous TID WC   multivitamin with minerals  1 tablet Oral Daily   oxybutynin   10 mg Oral Daily   pantoprazole  40 mg Oral Daily   tamsulosin   0.4 mg Oral QPC supper   Continuous Infusions:  cefTRIAXone (ROCEPHIN)  IV 1 g (12/10/23 0850)   Time Spent: 35 mins   LOS: 4 days  [name], MD [facility]   If 7PM-7AM, please contact night-coverage  12/10/2023, 9:07 PM

## 2023-12-11 ENCOUNTER — Ambulatory Visit

## 2023-12-11 DIAGNOSIS — R7989 Other specified abnormal findings of blood chemistry: Secondary | ICD-10-CM | POA: Diagnosis not present

## 2023-12-11 DIAGNOSIS — D696 Thrombocytopenia, unspecified: Secondary | ICD-10-CM | POA: Diagnosis not present

## 2023-12-11 DIAGNOSIS — N3000 Acute cystitis without hematuria: Secondary | ICD-10-CM | POA: Diagnosis not present

## 2023-12-11 DIAGNOSIS — A419 Sepsis, unspecified organism: Secondary | ICD-10-CM | POA: Diagnosis not present

## 2023-12-11 LAB — BASIC METABOLIC PANEL WITH GFR
Anion gap: 8 (ref 5–15)
BUN: 13 mg/dL (ref 6–20)
CO2: 28 mmol/L (ref 22–32)
Calcium: 8.5 mg/dL — ABNORMAL LOW (ref 8.9–10.3)
Chloride: 106 mmol/L (ref 98–111)
Creatinine, Ser: 1.31 mg/dL — ABNORMAL HIGH (ref 0.61–1.24)
GFR, Estimated: >60 mL/min (ref >60)
Glucose, Bld: 175 mg/dL — ABNORMAL HIGH (ref 70–99)
Potassium: 4.2 mmol/L (ref 3.5–5.1)
Sodium: 142 mmol/L (ref 135–145)

## 2023-12-11 LAB — CBC
HCT: 34.5 % — ABNORMAL LOW (ref 39.0–52.0)
Hemoglobin: 10.7 g/dL — ABNORMAL LOW (ref 13.0–17.0)
MCH: 29.1 pg (ref 26.0–34.0)
MCHC: 31 g/dL (ref 30.0–36.0)
MCV: 93.8 fL (ref 80.0–100.0)
Platelets: 118 10*3/uL — ABNORMAL LOW (ref 150–400)
RBC: 3.68 MIL/uL — ABNORMAL LOW (ref 4.22–5.81)
RDW: 15.1 % (ref 11.5–15.5)
WBC: 2.5 10*3/uL — ABNORMAL LOW (ref 4.0–10.5)
nRBC: 0.8 % — ABNORMAL HIGH (ref 0.0–0.2)

## 2023-12-11 LAB — CULTURE, BLOOD (ROUTINE X 2)
Culture: NO GROWTH
Culture: NO GROWTH
Special Requests: ADEQUATE
Special Requests: ADEQUATE

## 2023-12-11 LAB — GLUCOSE, CAPILLARY
Glucose-Capillary: 120 mg/dL — ABNORMAL HIGH (ref 70–99)
Glucose-Capillary: 165 mg/dL — ABNORMAL HIGH (ref 70–99)

## 2023-12-11 MED ORDER — CIPROFLOXACIN HCL 500 MG PO TABS: 500.0000 mg | ORAL_TABLET | Freq: Two times a day (BID) | ORAL | Status: AC

## 2023-12-11 MED ORDER — PANTOPRAZOLE SODIUM 40 MG PO TBEC: 40.0000 mg | DELAYED_RELEASE_TABLET | Freq: Every day | ORAL | Status: DC

## 2023-12-11 NOTE — Plan of Care (Signed)
  Problem: Coping: Goal: Ability to adjust to condition or change in health will improve Outcome: Progressing   Problem: Fluid Volume: Goal: Ability to maintain a balanced intake and output will improve Outcome: Progressing   Problem: Metabolic: Goal: Ability to maintain appropriate glucose levels will improve Outcome: Progressing   Problem: Skin Integrity: Goal: Risk for impaired skin integrity will decrease Outcome: Progressing   Problem: Tissue Perfusion: Goal: Adequacy of tissue perfusion will improve Outcome: Progressing

## 2023-12-11 NOTE — TOC Transition Note (Signed)
 Transition of Care [facility]) - Discharge Note   Patient Details  Name: [name] MRN: [id number] Date of Birth: [birth year]  [facility]) CM/SW Contact:  Odilia Bennett, LCSW Phone Number: 12/11/2023, 10:33 AM   Clinical Narrative:   Patient has orders to discharge to [facility] today. RN will call report to [phone] (Room 802). [facility] set up for around 12:00. No further concerns. CSW signing off.  Final next level of care: Skilled Nursing Facility Barriers to Discharge: Barriers Resolved   Patient Goals and CMS Choice            Discharge Placement   Existing PASRR number confirmed : 12/10/23          Patient chooses bed at: [facility] [place] Patient to be transferred to facility by: [facility] Name of family member notified: Kiara Keep Patient and family notified of of transfer: 12/11/23  Discharge Plan and Services Additional resources added to the After Visit Summary for       Post Acute Care Choice: Skilled Nursing Facility                               Social Drivers of Health (SDOH) Interventions SDOH Screenings   Food Insecurity: No Food Insecurity (12/07/2023)  Housing: Low Risk  (12/07/2023)  Transportation Needs: No Transportation Needs (12/07/2023)  Utilities: Not At Risk (12/07/2023)  Alcohol Screen: Low Risk  (07/12/2022)  Depression (PHQ2-9): Low Risk  (12/30/2022)  Financial Resource Strain: Low Risk  (07/12/2022)  Physical Activity: Inactive (07/12/2022)  Social Connections: Socially Isolated (07/12/2022)  Stress: No Stress Concern Present (07/12/2022)  Tobacco Use: Medium Risk (12/09/2023)     Readmission Risk Interventions     No data to display

## 2023-12-11 NOTE — TOC Progression Note (Signed)
 Transition of Care [facility]) - Progression Note    Patient Details  Name: [name] MRN: [id number] Date of Birth: March 12, 1971  Transition of Care [facility]) CM/SW Contact  Odilia Bennett, LCSW Phone Number: 12/11/2023, 9:20 AM  Clinical Narrative:   Patient and brother have accepted bed offer from [facility]. They have a bed today if stable. Sent secure chat to MD to notify.    Barriers to Discharge: Continued Medical Work up  Expected Discharge Plan and Services     Post Acute Care Choice: Skilled Nursing Facility Living arrangements for the past 2 months: Apartment                                       Social Determinants of Health (SDOH) Interventions SDOH Screenings   Food Insecurity: No Food Insecurity (12/07/2023)  Housing: Low Risk  (12/07/2023)  Transportation Needs: No Transportation Needs (12/07/2023)  Utilities: Not At Risk (12/07/2023)  Alcohol Screen: Low Risk  (07/12/2022)  Depression (PHQ2-9): Low Risk  (12/30/2022)  Financial Resource Strain: Low Risk  (07/12/2022)  Physical Activity: Inactive (07/12/2022)  Social Connections: Socially Isolated (07/12/2022)  Stress: No Stress Concern Present (07/12/2022)  Tobacco Use: Medium Risk (12/09/2023)    Readmission Risk Interventions     No data to display

## 2023-12-11 NOTE — Discharge Summary (Signed)
 Physician Discharge Summary   Patient: [name] MRN: [id number] DOB: 1970/10/29  Admit date:     12/06/2023  Discharge date: 12/11/23  Discharge Physician: [name]   PCP: [name], NP   Recommendations at discharge:   Follow-up with outpatient providers as requested  Discharge Diagnoses: Principal Problem:   Severe sepsis (HCC) Active Problems:   UTI (urinary tract infection)   GI bleeding   Pancytopenia (HCC)   Hypernatremia   Thrombocytopenia (HCC)   Hypertension associated with diabetes (HCC)   Hyperlipidemia associated with type 2 diabetes mellitus (HCC)   Abnormal LFTs   Type 2 diabetes mellitus with diabetic polyneuropathy (HCC)   Chronic diastolic CHF (congestive heart failure) (HCC)   History of stroke   Chronic communicating hydrocephalus (HCC)   BPH (benign prostatic hyperplasia)   Fall at home, initial encounter   Depression   Hypothermia  Hospital Course: Assessment and Plan:  53 y.o. male with medical history significant of stroke, community hydrocephalus, possible mild dementia on donepezil , hypertension, hyperlipidemia, diabetes mellitus, diastolic CHF, depression, BPH, who presents with fall, dark stool, weakness.   Patient states he has been feeling weak recently, and has had multiple falls recently, with last fall happened this morning.  No LOC.  No significant injury.  He complains of right knee pain, which seem to be a chronic issue.  Patient talks slowly, but clear in talking.  Per reported, patient had right-sided weakness from previous stroke, but on my examination, patient's muscle strength in extremities seem to be normal.  No facial droop or slurred speech.  Patient denies chest pain, cough, SOB.  No nausea, vomiting, diarrhea or abdominal pain.  He reports intermittent dark stool for about 1 month.  No fever or chills.  Denies dysuria, hematuria or burning with urination.   6/4: Advanced diet to regular.  PT and OT eval and SNF  placement in process   Assessment & Plan:   Principal Problem:   Severe sepsis (HCC) Active Problems:   UTI (urinary tract infection)   GI bleeding   Pancytopenia (HCC)   Hypernatremia   Thrombocytopenia (HCC)   Hypertension associated with diabetes (HCC)   Hyperlipidemia associated with type 2 diabetes mellitus (HCC)   Abnormal LFTs   Type 2 diabetes mellitus with diabetic polyneuropathy (HCC)   Chronic diastolic CHF (congestive heart failure) (HCC)   History of stroke   Chronic communicating hydrocephalus (HCC)   BPH (benign prostatic hyperplasia)   Fall at home, initial encounter   Depression   Hypothermia   Severe sepsis due to UTI (urinary tract infection)  Sepsis due to UTI. Pt meets criteria for severe sepsis with WBC <3.5 (which is < 4.0), hypothermia with temperature 91.1.  Lactic acid 3.1 --> 3.0.  Blood pressure is soft, but hemodynamically stable.  Pt has hx of positive urine culture for Acinetobacter Baumanni which is intermittently susceptible to ceftriaxone.  Urine culture positive for E. coli Plan: Continue Rocephin for now Start ciprofloxacin  at discharge   GI bleeding and pancytopenia [name]):  Hgb 14.1 --> 10.7 Patient has intermittent dark stool for about a month.  Pt is taking ASA and Plavix . EGD showed gastric erosions No further GI follow-up -Resume DAPT aspirin  and Plavix  at discharge -Continue Protonix at discharge   Thrombocytopenia:  Stable, no indication for transfusion   Hypernatremia:  Na 150-> 142 Improved with hydration   Hypertension associated with diabetes (HCC):  Resume blood pressure medicines   Hyperlipidemia associated with type 2 diabetes  mellitus (HCC) - Continue statin   Abnormal LFTs: Mild, likely due to severe sepsis. - Hepatitis panel negative.   Type 2 diabetes mellitus with diabetic polyneuropathy (HCC): Recent A1c 5.5, well-controlled.   Resume home medications   Chronic diastolic CHF (congestive heart failure)  (HCC): 2D echo on 09/05/2020 showed EF of 50-55% with grade 2 diastolic dysfunction.  Well compensated at this time   History of stroke -Resume aspirin , Plavix  and Lipitor    Chronic communicating hydrocephalus [facility]): Patient seems to have mild cognitive impairment. - Continue donepezil    BPH (benign prostatic hyperplasia) - Continue Flomax , oxybutynin    Fall at home, initial encounter - Continue fall precaution - PT/OT recommends skilled nursing facility at time of discharge.  [facility]   Depression - Continue Celexa    Hypothermia Transient and resolved      Consultants: GI Procedures performed: EGD on 6/3 Disposition: Skilled nursing facility Diet recommendation:  Discharge Diet Orders (From admission, onward)     Start     Ordered   12/11/23 0000  Diet - low sodium heart healthy        12/11/23 0944           Carb modified diet DISCHARGE MEDICATION: Allergies as of 12/11/2023   No Known Allergies      Medication List     TAKE these medications    Accu-Chek Guide Test test strip Generic drug: glucose blood Use as instructed   Accu-Chek Softclix Lancets lancets USE 2-3 TIMES A DAY AS DIRECTED   acetaminophen  325 MG tablet Commonly known as: Tylenol  Take 2 tablets (650 mg total) by mouth every 6 (six) hours as needed for mild pain (pain score 1-3).   amLODipine  10 MG tablet Commonly known as: NORVASC  Take 1 tablet (10 mg total) by mouth daily.   aspirin  EC 81 MG tablet Commonly known as: Aspir-Low Take 1 tablet (81 mg total) by mouth daily. Swallow whole.   atorvastatin  80 MG tablet Commonly known as: LIPITOR  TAKE 1 TABLET BY MOUTH ONCE DAILY AT 6:00PM   Blood Pressure Monitor Automat Devi Use to monitor blood pressure once daily.   carvedilol  6.25 MG tablet Commonly known as: COREG  Take 1 tablet (6.25 mg total) by mouth 2 (two) times daily with a meal.   ciprofloxacin  500 MG tablet Commonly known as: Cipro  Take  1 tablet (500 mg total) by mouth 2 (two) times daily for 2 days.   citalopram  10 MG tablet Commonly known as: CELEXA  Take 1 tablet (10 mg total) by mouth daily.   clopidogrel  75 MG tablet Commonly known as: PLAVIX  TAKE 1 TABLET BY MOUTH ONCE EVERY MORNING WITH BREAKFAST   Contour Next Monitor w/Device Kit Use to check blood sugar 3 times daily, fasting in morning with goal <130 and 2 hours after meals with goal <180.  Bring blood sugar log to visits.  Dx. E11.42   cyanocobalamin 1000 MCG tablet Commonly known as: VITAMIN B12 Take 1 tablet (1,000 mcg total) by mouth daily.   donepezil  5 MG tablet Commonly known as: ARICEPT  Take 1 tablet (5 mg total) by mouth daily.   gabapentin  300 MG capsule Commonly known as: NEURONTIN  Take 1 capsule (300 mg total) by mouth 3 (three) times daily.   lisinopril  5 MG tablet Commonly known as: ZESTRIL  Take 1 tablet (5 mg total) by mouth daily.   metFORMIN  500 MG 24 hr tablet Commonly known as: GLUCOPHAGE -XR Take 1 tablet (500 mg total) by mouth 2 (  two) times daily with a meal.   multivitamin tablet Take 1 tablet by mouth daily.   oxybutynin  10 MG 24 hr tablet Commonly known as: Ditropan  XL Take 1 tablet (10 mg total) by mouth at bedtime.   pantoprazole 40 MG tablet Commonly known as: PROTONIX Take 1 tablet (40 mg total) by mouth daily. Start taking on: December 12, 2023   tamsulosin  0.4 MG Caps capsule Commonly known as: FLOMAX  Take 1 capsule (0.4 mg total) by mouth daily after supper.        Contact information for follow-up providers     [name], NP. Schedule an appointment as soon as possible for a visit in 1 week(s).   Specialty: Nurse Practitioner Why: [facility] Discharge F/UP Contact information: [address] [place] [address] [phone]              Contact information for after-discharge care     Destination     [facility] Preferred SNF .   Service: Skilled  Nursing Contact information: [address] [place]  [address] [phone]                    Discharge Exam: Filed Weights   12/09/23 0522 12/10/23 0502 12/11/23 0500  Weight: 116 kg 107.3 kg 106.2 kg   General exam: No acute distress Respiratory system: Lungs clear.  Normal work of breathing.  Room air Cardiovascular system: S1-2, RRR, no murmurs, no pedal edema Gastrointestinal system:, NT/ND, normal bowel sounds Central nervous system: Alert and oriented x 2.  Dysarthric speech Extremities: Symmetric 5 x 5 power. Skin: No rashes, lesions or ulcers Psychiatry: Judgement and insight appear impaired. Mood & affect confused.   Condition at discharge: fair  The results of significant diagnostics from this hospitalization (including imaging, microbiology, ancillary and laboratory) are listed below for reference.   Imaging Studies: CT HEAD WO CONTRAST ( ) Result Date: 12/07/2023 EXAM: CT HEAD WITHOUT TECHNIQUE: CT of the head was performed without the administration of intravenous contrast. Automated exposure control, iterative reconstruction, and/or weight based adjustment of the mA/kV was utilized to reduce the radiation dose to as low as reasonably achievable. 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 Old left basal ganglia lacunar infarct. There is no acute intracranial hemorrhage, mass effect or midline shift. No abnormal extra-axial fluid collection. The gray-white differentiation is maintained without evidence of an acute infarct. Empty sella. ORBITS: The visualized portion of the orbits demonstrate no acute abnormality. SINUSES: The visualized paranasal sinuses and mastoid air cells demonstrate no acute abnormality. SOFT TISSUES AND SKULL: Vascular calcifications. No acute abnormality of  the visualized skull or soft tissues. IMPRESSION: 1. No acute findings. 2. 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 3. Old left basal ganglia lacunar infarct. Electronically signed by: Zadie Herter MD 12/07/2023 02:10 AM EDT RP Workstation: [id number]   DG Chest Portable 1 View Result Date: 12/06/2023 CLINICAL DATA:  Weakness.  Patient fell at home. EXAM: PORTABLE CHEST 1 VIEW COMPARISON:  08/31/2023 FINDINGS: Normal heart size and pulmonary vascularity. No focal airspace disease or consolidation in the lungs. No blunting of costophrenic angles. No pneumothorax. Mediastinal contours appear intact. Loop recorder is present. Old appearing right rib fractures. IMPRESSION: No active disease. Electronically Signed   By: Babara Bolls.D.  On: 12/06/2023 22:22   DG Knee Complete 4 Views Right Result Date: 12/06/2023 CLINICAL DATA:  Right knee pain after a fall. EXAM: RIGHT KNEE - COMPLETE 4+ VIEW COMPARISON:  None Available. FINDINGS: Degenerative changes in the right knee with medial and lateral compartment narrowing and small osteophyte formation. Growth arrest lines in the proximal tibia. No evidence of acute fracture or dislocation. No focal bone lesion or bone destruction. Bone cortex appears intact. No significant effusions. Soft tissues are unremarkable. Vascular calcifications. IMPRESSION: Moderate degenerative changes in the right knee. No acute displaced fractures identified. Electronically Signed   By: Boyce Byes M.D.   On: 12/06/2023 20:10    Microbiology: Results for orders placed or performed during the hospital encounter of 12/06/23  Blood culture (routine x 2)     Status: None   Collection Time: 12/06/23  9:52 PM   Specimen: BLOOD LEFT WRIST  Result Value Ref Range Status   Specimen Description BLOOD LEFT WRIST  Final   Special Requests   Final    BOTTLES DRAWN AEROBIC AND  ANAEROBIC Blood Culture adequate volume   Culture   Final    NO GROWTH 5 DAYS Performed at [facility], [address] [place], [place] [address]    Report Status 12/11/2023 FINAL  Final  Blood culture (routine x 2)     Status: None   Collection Time: 12/06/23 10:05 PM   Specimen: BLOOD RIGHT ARM  Result Value Ref Range Status   Specimen Description BLOOD RIGHT ARM  Final   Special Requests   Final    BOTTLES DRAWN AEROBIC AND ANAEROBIC Blood Culture adequate volume   Culture   Final    NO GROWTH 5 DAYS Performed at [facility], [address] [place], [place] [address]    Report Status 12/11/2023 FINAL  Final  Urine Culture     Status: Abnormal   Collection Time: 12/06/23 10:31 PM   Specimen: Urine, Random  Result Value Ref Range Status   Specimen Description   Final    URINE, RANDOM Performed at [facility], [address] [place], [place] [address]    Special Requests   Final    NONE Reflexed from [id number] Performed at [facility], [address] [place], [place] [address]    Culture >=100,000 COLONIES/mL ESCHERICHIA COLI (A)  Final   Report Status 12/09/2023 FINAL  Final   Organism ID, Bacteria ESCHERICHIA COLI (A)  Final      Susceptibility   Escherichia coli - MIC*    AMPICILLIN >=32 RESISTANT Resistant     CEFAZOLIN <=4 SENSITIVE Sensitive     CEFEPIME <=0.12 SENSITIVE Sensitive     CEFTRIAXONE <=0.25 SENSITIVE Sensitive     CIPROFLOXACIN  <=0.25 SENSITIVE Sensitive     GENTAMICIN <=1 SENSITIVE Sensitive     IMIPENEM <=0.25 SENSITIVE Sensitive     NITROFURANTOIN <=16 SENSITIVE Sensitive     TRIMETH /SULFA  <=20 SENSITIVE Sensitive     AMPICILLIN/SULBACTAM 8 SENSITIVE Sensitive     PIP/TAZO <=4 SENSITIVE Sensitive ug/mL    * >=100,000 COLONIES/mL ESCHERICHIA COLI    Labs: CBC: Recent Labs  Lab 12/06/23 1940 12/06/23 2358 12/07/23 0928 12/08/23 0954 12/09/23 1000 12/10/23 0509 12/11/23 0226  WBC 3.5*   < >  2.9* 2.7* 1.9* 2.1* 2.5*  NEUTROABS 2.0  --   --  1.5* 0.8* 1.1*  --   HGB 11.8*   < > 11.1* 11.3* 11.8* 11.6* 10.7*  HCT 38.7*   < >  36.1* 36.0* 36.9* 37.9* 34.5*  MCV 95.8   < > 94.3 93.0 91.6 92.7 93.8  PLT 118*   < > 104* 109* 109* 120* 118*   < > = values in this interval not displayed.   Basic Metabolic Panel: Recent Labs  Lab 12/07/23 0928 12/08/23 0954 12/09/23 1000 12/10/23 0509 12/11/23 0226  NA 149* 142 147* 144 142  K 4.0 4.0 4.2 4.0 4.2  CL 114* 107 113* 107 106  CO2 28 27 26 30 28   GLUCOSE 71 197* 112* 95 175*  BUN 30* 22* 14 13 13   CREATININE 1.32* 1.34* 1.19 1.15 1.31*  CALCIUM  8.3* 8.3* 8.7* 8.9 8.5*   Liver Function Tests: Recent Labs  Lab 12/06/23 1940  AST 63*  ALT 77*  ALKPHOS 70  BILITOT 0.4  PROT 7.2  ALBUMIN 3.8   CBG: Recent Labs  Lab 12/10/23 0825 12/10/23 1152 12/10/23 1558 12/10/23 2102 12/11/23 0857  GLUCAP 91 163* 221* 97 120*    Discharge time spent: greater than 30 minutes.  Signed: [name], MD [facility] 12/11/2023

## 2023-12-11 NOTE — Discharge Instructions (Signed)

## 2023-12-11 NOTE — Progress Notes (Signed)
 Occupational Therapy Treatment Patient Details Name: [name] MRN: [id number] DOB: Jul 21, 1970 Today's Date: 12/11/2023   History of present illness Pt is a 53 y.o. male presenting to hospital 12/06/23 with c/o falls and AMS; c/o R knee pain after recent fall (but has been able to walk afterwards).  Pt admitted with severe sepsis d/t UTI, GI bleeding and pancytopenia, thrombocytopenia, hypernatremia, htn, abnormal LFT's.  PMH includes stroke, community hydrocephalus, possible mild dementia, htn, HLD, DM, diastolic CHF, depression, BPH, seizures.   OT comments  Pt is seated EOB on arrival. Pleasant and agreeable to OT session. He denies pain. Pt performed STS from EOB to RW with Min A x1, able to maintain static standing balance with Min A, cueing for anterior weight shift d/t notable posterior lean and ataxia causing imbalance while LB bathing/peri-care performed by NT with Max A. Pt required Min A x1 and CGA x1 for step pivot to recliner using RW, RW management during turns. Pt stood to perform oral care with unilateral support on RW and Min A needed for balance and performance of tasks. Utilized urinal in standing with Min A for balance as well. Face washing with set up seated in recliner.  Pt left in recliner with all needs in place and chair alarm on and will cont to require skilled acute OT services to maximize his safety and IND to return to PLOF.       If plan is discharge home, recommend the following:  A lot of help with walking and/or transfers;A lot of help with bathing/dressing/bathroom;Help with stairs or ramp for entrance   Equipment Recommendations  Other (comment) (defer)    Recommendations for Other Services      Precautions / Restrictions Precautions Precautions: Fall Recall of Precautions/Restrictions: Impaired Restrictions Weight Bearing Restrictions Per Provider Order: No       Mobility Bed Mobility               General bed mobility comments: seated EOB  on entry with NT present    Transfers Overall transfer level: Needs assistance Equipment used: Rolling walker (2 wheels) Transfers: Sit to/from Stand, Bed to chair/wheelchair/BSC Sit to Stand: Min assist     Step pivot transfers: Contact guard assist, Min assist, +2 physical assistance     General transfer comment: Min A x1 for STS from slightly elevated bed height then transferred to recliner with Min A x1 and CGA x1 with NT present for assist; remains with post lean and imbalance and hyperextension at knees     Balance Overall balance assessment: Needs assistance Sitting-balance support: No upper extremity supported, Feet supported Sitting balance-Leahy Scale: Fair Sitting balance - Comments: steady static sitting   Standing balance support: Reliant on assistive device for balance, Single extremity supported, During functional activity Standing balance-Leahy Scale: Poor Standing balance comment: min/mod A to maintain balance safely at RW to perform functional ADL task and prevent balance loss                           ADL either performed or assessed with clinical judgement   ADL Overall ADL's : Needs assistance/impaired     Grooming: Wash/dry face;Oral care;Standing;Minimal assistance Grooming Details (indicate cue type and reason): pt unable to take bil hands off walker d/t instability, assist to open toothpaste and stabilize toothbrush for him to place toothpaste, then pt able to brush with LUE, assist for rinse cup and spit basin     Lower  Body Bathing: Moderate assistance;Maximal assistance;Sit to/from stand Lower Body Bathing Details (indicate cue type and reason): max a for peri-care bathing in standing at bedside with RW use; Min/mod A to maintain balance d/t posterior lean and ataxia         Toilet Transfer: +2 for physical assistance;+2 for safety/equipment;Rolling walker (2 wheels);Minimal assistance;Contact guard assist Toilet Transfer Details  (indicate cue type and reason): simulated to recliner Toileting- Clothing Manipulation and Hygiene: Minimal assistance;Sit to/from stand Toileting - Clothing Manipulation Details (indicate cue type and reason): standing use of urinal in front of recliner with Min A for balance at [employer]            Extremity/Trunk Assessment              Vision       Perception     Praxis     Communication Communication Communication: Impaired Factors Affecting Communication: Difficulty expressing self   Cognition Arousal: Alert Behavior During Therapy: Flat affect                                 Following commands: Impaired Following commands impaired: Follows one step commands with increased time      Cueing   Cueing Techniques: Verbal cues  Exercises      Shoulder Instructions       General Comments      Pertinent Vitals/ Pain       Pain Assessment Pain Assessment: No/denies pain  Home Living                                          Prior Functioning/Environment              Frequency  Min 2X/week        Progress Toward Goals  OT Goals(current goals can now be found in the care plan section)  Progress towards OT goals: Progressing toward goals  Acute Rehab OT Goals Patient Stated Goal: get better OT Goal Formulation: With patient Time For Goal Achievement: 12/22/23 Potential to Achieve Goals: Fair  Plan      Co-evaluation                 AM-PAC OT "6 Clicks" Daily Activity     Outcome Measure   Help from another person eating meals?: None Help from another person taking care of personal grooming?: A Little Help from another person toileting, which includes using toliet, bedpan, or urinal?: A Lot Help from another person bathing (including washing, rinsing, drying)?: A Lot Help from another person to put on and taking off regular upper body clothing?: A Little Help from another person to put on and taking off  regular lower body clothing?: A Lot 6 Click Score: 16    End of Session Equipment Utilized During Treatment: Gait belt;Rolling walker (2 wheels)  OT Visit Diagnosis: Other abnormalities of gait and mobility (R26.89);Unsteadiness on feet (R26.81);Muscle weakness (generalized) (M62.81)   Activity Tolerance Patient tolerated treatment well   Patient Left in chair;with call bell/phone within reach;with chair alarm set;with nursing/sitter in room   Nurse Communication Mobility status        Time: 1610-9604 OT Time Calculation (min): 16 min  Charges: OT General Charges $OT Visit: 1 Visit OT Treatments $Self Care/Home Management : 8-22 mins  Garrie Elenes, OTR/L  12/11/23,  12:07 PM   Jerene Yeager E Tayleigh Wetherell 12/11/2023, 12:04 PM

## 2024-01-02 ENCOUNTER — Telehealth: Payer: Self-pay

## 2024-01-02 NOTE — Telephone Encounter (Signed)
 Copied from CRM [id number]. Topic: Clinical - Request for Lab/Test Order >> Jan 02, 2024  3:26 PM Delon HERO wrote: Reason for CRM: Lauraine calling from [facility] is calling to report that she received orders from [place].  Patient will be discharging from [facility] 01/06/2024  Calling to see if [name] is willing to follow the patient. CB- [phone] verbal ok on Voice Mail

## 2024-01-05 NOTE — Telephone Encounter (Signed)
 Returned call to Lauraine and provided verbal.

## 2024-01-07 ENCOUNTER — Ambulatory Visit: Admitting: Nurse Practitioner

## 2024-01-10 NOTE — Patient Instructions (Incomplete)

## 2024-01-12 ENCOUNTER — Other Ambulatory Visit: Payer: Self-pay

## 2024-01-12 MED ORDER — PANTOPRAZOLE SODIUM 40 MG PO TBEC: 40.0000 mg | DELAYED_RELEASE_TABLET | Freq: Every day | ORAL | 1 refills | Status: DC

## 2024-01-12 NOTE — Telephone Encounter (Signed)
 Copied from CRM [id number]. Topic: Clinical - Prescription Issue >> Jan 12, 2024  2:47 PM Carlatta H wrote: Reason for CRM: Called to see if [name] can re write the prescription that the patient was given in the ED and send it to [facility] for pantoprazole  (PROTONIX ) 40 MG tablet [[id number]] ENDED  PH is [phone] please call and ask for [facility] with any questions

## 2024-01-13 ENCOUNTER — Telehealth: Payer: Self-pay

## 2024-01-13 NOTE — Telephone Encounter (Signed)
Called and LVM giving verbal orders per [name].  ?

## 2024-01-13 NOTE — Telephone Encounter (Signed)
 Copied from CRM [id number]. Topic: Clinical - Home Health Verbal Orders >> Jan 13, 2024  7:45 AM Montie POUR wrote: Caller/Agency: Med[place] with [facility] [place] Callback Number: [phone]- He has a secured voicemail Service Requested: Physical Therapy and Medical Social Workers Assessment  Frequency: 2 times weekly for 2 weeks; 1 time weekly for 4 weeks Any new concerns about the patient? No

## 2024-01-14 ENCOUNTER — Ambulatory Visit: Admitting: Nurse Practitioner

## 2024-01-14 ENCOUNTER — Telehealth: Payer: Self-pay

## 2024-01-14 DIAGNOSIS — D696 Thrombocytopenia, unspecified: Secondary | ICD-10-CM

## 2024-01-14 DIAGNOSIS — I5032 Chronic diastolic (congestive) heart failure: Secondary | ICD-10-CM

## 2024-01-14 DIAGNOSIS — R809 Proteinuria, unspecified: Secondary | ICD-10-CM

## 2024-01-14 DIAGNOSIS — E1169 Type 2 diabetes mellitus with other specified complication: Secondary | ICD-10-CM

## 2024-01-14 DIAGNOSIS — E119 Type 2 diabetes mellitus without complications: Secondary | ICD-10-CM

## 2024-01-14 DIAGNOSIS — E1159 Type 2 diabetes mellitus with other circulatory complications: Secondary | ICD-10-CM

## 2024-01-14 DIAGNOSIS — G910 Communicating hydrocephalus: Secondary | ICD-10-CM

## 2024-01-14 DIAGNOSIS — E1142 Type 2 diabetes mellitus with diabetic polyneuropathy: Secondary | ICD-10-CM

## 2024-01-14 DIAGNOSIS — I429 Cardiomyopathy, unspecified: Secondary | ICD-10-CM

## 2024-01-14 NOTE — Telephone Encounter (Signed)
 Copied from CRM [id number]. Topic: Clinical - Home Health Verbal Orders >> Jan 14, 2024  2:44 PM Elle L wrote: Medford with [facility] states he did not receive the voicemail for the verbal orders. I reached out to the office but they were assisting other patient's at this time. His call back number is [phone] and he has a secured voicemail.   Original orders: Caller/Agency: Med[place] with [facility] Callback Number: [phone]- He has a secured voicemail Service Requested: Physical Therapy and Medical Social Workers Assessment  Frequency: 2 times weekly for 2 weeks; 1 time weekly for 4 weeks Any new concerns about the patient? No

## 2024-01-14 NOTE — Telephone Encounter (Signed)
Called and LVM giving verbal orders per [name].  ?

## 2024-01-15 ENCOUNTER — Telehealth: Payer: Self-pay

## 2024-01-15 NOTE — Telephone Encounter (Signed)
 Copied from CRM [id number]. Topic: General - Other >> Jan 15, 2024 11:29 AM Mia F wrote: Reason for CRM: Medford from [facility] [phone]. Calling to discuss some important information regarding pt before he comes in for an appt. Would not further elaborate.

## 2024-01-16 ENCOUNTER — Telehealth: Payer: Self-pay

## 2024-01-16 NOTE — Telephone Encounter (Unsigned)
 Copied from CRM [id number]. Topic: General - Other >> Jan 15, 2024 11:29 AM Mia F wrote: Reason for CRM: [place] from [facility] [phone]. Calling to discuss some important information regarding pt before he comes in for an appt. Would not further elaborate. >> Jan 15, 2024  5:02 PM Selinda RAMAN wrote: [place] with [facility] called back and would like a return call from [place] to discuss some things regarding the patient. He would like a call back as soon as possible. He states the patient would benefit for a seating and mobility assessment for a custom wheelchair. He recommends it to be sent to [facility] in [place] at number [phone] and fax number [phone]. Please assist further

## 2024-01-17 NOTE — Patient Instructions (Signed)

## 2024-01-20 ENCOUNTER — Encounter: Payer: Self-pay | Admitting: Nurse Practitioner

## 2024-01-20 ENCOUNTER — Ambulatory Visit (INDEPENDENT_AMBULATORY_CARE_PROVIDER_SITE_OTHER): Admitting: Nurse Practitioner

## 2024-01-20 VITALS — BP 100/64 | HR 72 | Temp 97.9°F | Wt 250.0 lb

## 2024-01-20 DIAGNOSIS — E1159 Type 2 diabetes mellitus with other circulatory complications: Secondary | ICD-10-CM

## 2024-01-20 DIAGNOSIS — I5032 Chronic diastolic (congestive) heart failure: Secondary | ICD-10-CM

## 2024-01-20 DIAGNOSIS — G910 Communicating hydrocephalus: Secondary | ICD-10-CM

## 2024-01-20 DIAGNOSIS — E1169 Type 2 diabetes mellitus with other specified complication: Secondary | ICD-10-CM

## 2024-01-20 DIAGNOSIS — E1142 Type 2 diabetes mellitus with diabetic polyneuropathy: Secondary | ICD-10-CM

## 2024-01-20 DIAGNOSIS — Z23 Encounter for immunization: Secondary | ICD-10-CM

## 2024-01-20 DIAGNOSIS — D696 Thrombocytopenia, unspecified: Secondary | ICD-10-CM

## 2024-01-20 DIAGNOSIS — I152 Hypertension secondary to endocrine disorders: Secondary | ICD-10-CM

## 2024-01-20 DIAGNOSIS — E119 Type 2 diabetes mellitus without complications: Secondary | ICD-10-CM

## 2024-01-20 DIAGNOSIS — I429 Cardiomyopathy, unspecified: Secondary | ICD-10-CM

## 2024-01-20 DIAGNOSIS — E1129 Type 2 diabetes mellitus with other diabetic kidney complication: Secondary | ICD-10-CM | POA: Diagnosis not present

## 2024-01-20 DIAGNOSIS — Z8673 Personal history of transient ischemic attack (TIA), and cerebral infarction without residual deficits: Secondary | ICD-10-CM

## 2024-01-20 DIAGNOSIS — F320 Major depressive disorder, single episode, mild: Secondary | ICD-10-CM

## 2024-01-20 DIAGNOSIS — Z7409 Other reduced mobility: Secondary | ICD-10-CM

## 2024-01-20 DIAGNOSIS — E66812 Obesity, class 2: Secondary | ICD-10-CM

## 2024-01-20 LAB — BAYER DCA HB A1C WAIVED: HB A1C (BAYER DCA - WAIVED): 6.3 % — ABNORMAL HIGH (ref 4.8–5.6)

## 2024-01-20 MED ORDER — PANTOPRAZOLE SODIUM 40 MG PO TBEC: 40.0000 mg | DELAYED_RELEASE_TABLET | Freq: Every day | ORAL | 2 refills | Status: AC

## 2024-01-20 MED ORDER — CLOPIDOGREL BISULFATE 75 MG PO TABS: ORAL_TABLET | ORAL | 2 refills | Status: AC

## 2024-01-20 MED ORDER — GABAPENTIN 300 MG PO CAPS: 300.0000 mg | ORAL_CAPSULE | Freq: Three times a day (TID) | ORAL | 2 refills | Status: AC

## 2024-01-20 MED ORDER — ASPIRIN 81 MG PO TBEC: 81.0000 mg | DELAYED_RELEASE_TABLET | Freq: Every day | ORAL | 2 refills | Status: AC

## 2024-01-20 MED ORDER — LISINOPRIL 5 MG PO TABS: 5.0000 mg | ORAL_TABLET | Freq: Every day | ORAL | 2 refills | Status: AC

## 2024-01-20 MED ORDER — ATORVASTATIN CALCIUM 80 MG PO TABS: ORAL_TABLET | ORAL | 2 refills | Status: AC

## 2024-01-20 MED ORDER — TAMSULOSIN HCL 0.4 MG PO CAPS: 0.4000 mg | ORAL_CAPSULE | Freq: Every day | ORAL | 2 refills | Status: AC

## 2024-01-20 MED ORDER — CITALOPRAM HYDROBROMIDE 10 MG PO TABS: 10.0000 mg | ORAL_TABLET | Freq: Every day | ORAL | 2 refills | Status: AC

## 2024-01-20 MED ORDER — CARVEDILOL 6.25 MG PO TABS: 6.2500 mg | ORAL_TABLET | Freq: Two times a day (BID) | ORAL | 2 refills | Status: DC

## 2024-01-20 MED ORDER — DONEPEZIL HCL 5 MG PO TABS: 5.0000 mg | ORAL_TABLET | Freq: Every day | ORAL | 2 refills | Status: AC

## 2024-01-20 MED ORDER — METFORMIN HCL ER 500 MG PO TB24: 500.0000 mg | ORAL_TABLET | Freq: Two times a day (BID) | ORAL | 2 refills | Status: AC

## 2024-01-20 MED ORDER — AMLODIPINE BESYLATE 5 MG PO TABS: 5.0000 mg | ORAL_TABLET | Freq: Every day | ORAL | 2 refills | Status: AC

## 2024-01-20 MED ORDER — OXYBUTYNIN CHLORIDE ER 10 MG PO TB24: 10.0000 mg | ORAL_TABLET | Freq: Every day | ORAL | 2 refills | Status: AC

## 2024-01-20 NOTE — Assessment & Plan Note (Addendum)
 Continue collaboration with neurology, recommend they return for visit with them.  Discussed goals with patient LDL <55, A1c <6.5%, and BP <130/80.

## 2024-01-20 NOTE — Assessment & Plan Note (Signed)
 Chronic, ongoing.  A1c 5.5% last visit -- recheck today.  Urine ALB sample today.  Continue Metformin  XR 500 MG BID, although may need to adjust dependent sugar levels.  Due to history of hypoglycemia, which he has had in past, will avoid restart of Glipizide  at this time and if elevation consider SGLT or GLP1 add on -- would be cautious with SGLT2 due to urinary incontinence at baseline. Continue Gabapentin  for discomfort. Recommend he focus on diet, as still tends to snack a lot at home, and avoid alcohol use. Continue checking BS daily at home.   - Statin and ACE on board - Vaccinations up to date - Recommend he schedule eye exam.  Foot exam up to date.

## 2024-01-20 NOTE — Assessment & Plan Note (Signed)
 Chronic, stable.  Recommend they schedule follow-up with cardiology.  Noted diagnosis on hospital list.  Last echo was 2022 with EF 50-55%.  Euvolemic today.  Recommend: - Reminded to call for an overnight weight gain of >2 pounds or a weekly weight gain of >5 pounds - not adding salt to food and read food labels. Reviewed the importance of keeping daily sodium intake to 2000mg  daily.  - Avoid Ibuprofen  medications

## 2024-01-20 NOTE — Assessment & Plan Note (Signed)
 BMI 32.98 with T2DM, HTN.  Maintaining weight loss. Recommended eating smaller high protein, low fat meals more frequently and exercising 30 mins a day 5 times a week with a goal of 10-15lb weight loss in the next 3 months. Patient voiced their understanding and motivation to adhere to these recommendations.  Recommend continue cut back on alcohol.

## 2024-01-20 NOTE — Assessment & Plan Note (Signed)
 Ongoing, continue collaboration with neurology as needed and current memory medication as ordered by them in past.  Recommend they schedule follow-up with neurology, his caregiver (brother) has been unable to get him there.

## 2024-01-20 NOTE — Assessment & Plan Note (Signed)
 Chronic, ongoing due to past CVA.  Continue use of walker and W/C as needed.  Currently working with PT which is offering benefit.

## 2024-01-20 NOTE — Addendum Note (Signed)
 Addended by: Brynden Thune T on: 01/20/2024 10:50 AM   Modules accepted: Orders

## 2024-01-20 NOTE — Assessment & Plan Note (Signed)
 Noted on past labs, will recheck today and monitor closely as takes ASA and Plavix .

## 2024-01-20 NOTE — Assessment & Plan Note (Signed)
 Chronic, ongoing.  Continue current medication regimen and adjust as needed.  Lipid panel today.  Refills up to date.

## 2024-01-20 NOTE — Assessment & Plan Note (Signed)
 Stable, followed by cardiology, continue this collaboration.  Recommend they schedule follow-up with cardiology, his caregiver (brother) has been unable to get him there.

## 2024-01-20 NOTE — Progress Notes (Addendum)
 BP 100/64   Pulse 72   Temp 97.9 F (36.6 C) (Oral)   Wt 250 lb (113.4 kg)   SpO2 92%   BMI 32.98 kg/m    Subjective:    Patient ID: [name], male    DOB: February 20, 1971, 53 y.o.   MRN: [id number]  HPI: [name] is a 53 y.o. male   Chief Complaint  Patient presents with   Congestive Heart Failure   Diabetes    No recent eye exam per patient   Hyperlipidemia   Hypertension   Brother at bedside to assist with HPI.  DIABETES A1c in April was 5.5%.  Taking Metformin  XR 500 MG BID.  Current alcohol use, but reports this is reduced. Has B12 deficiency but recent levels were stable with supplement.  PT currently coming into home twice a week to work on mobility. Hypoglycemic episodes:no Polydipsia/polyuria: no Visual disturbance: no Chest pain: no Paresthesias: no Glucose Monitoring: no  Accucheck frequency: Not Checking  Fasting glucose:  Post prandial:  Evening:  Before meals: Taking Insulin ?: no  Long acting insulin :  Short acting insulin : Blood Pressure Monitoring: not checking Retinal Examination: Not up to Date -- trying to get him scheduled for [facility] Foot Exam: Up to Date Diabetic Education: Not Completed Pneumovax: Up to Date Influenza: Up to Date Aspirin : yes   HYPERTENSION / HYPERLIPIDEMIA/HF Continues to take Amlodipine , ASA, Plavix , Carvedilol , Lisinopril , and Lipitor .  No visit with cardiology since 11/06/21, was to return but has not. Has history of cardiomyopathy reported. History of CVA, continues on Aricept  for memory changes + has underlying chronic communicating hydrocephalus. Has not followed up with neurology since 06/20/20.  Has underlying issues with mobility due to past CVA. Had hospitalization on 12/06/23 due to fall with GI bleed and severe sepsis at the time + UTI.  Thrombocytopenia noted.  Last echo on 09/05/20 noted EF 50 to 55% and left ventricle low normal function. Satisfied with current treatment? yes Duration of  hypertension: chronic BP monitoring frequency: not checking BP medication side effects: no Duration of hyperlipidemia: chronic Cholesterol medication side effects: no Cholesterol supplements: none Medication compliance: fair compliance Aspirin : yes Recent stressors: no Recurrent headaches: no Visual changes: no Palpitations: no Dyspnea: no Chest pain: no Lower extremity edema: no Dizzy/lightheaded: no   DEPRESSION Continues to take Celexa  10 MG daily. Mood status: stable Satisfied with current treatment?: yes Symptom severity: moderate  Duration of current treatment : chronic Side effects: no Medication compliance: good compliance Psychotherapy/counseling: none Depressed mood: no Anxious mood: no Anhedonia: no Significant weight loss or gain: no Insomnia: none Fatigue: no Feelings of worthlessness or guilt: no Impaired concentration/indecisiveness: no Suicidal ideations: no Hopelessness: no Crying spells: no    01/20/2024    8:04 AM 12/30/2022    2:15 PM 07/12/2022    5:13 PM 09/10/2021    2:51 PM 07/10/2021    9:14 AM  Depression screen PHQ 2/9  Decreased Interest 0 0 1 0 0  Down, Depressed, Hopeless 0 0 0 0 0  PHQ - 2 Score 0 0 1 0 0  Altered sleeping 0 0 1 0   Tired, decreased energy 0 0 1 0   Change in appetite 0 0 0 0   Feeling bad or failure about yourself  0 0 0 0   Trouble concentrating 0 0 0 0   Moving slowly or fidgety/restless 0 0 0 0   Suicidal thoughts 0 0 0 0   PHQ-9 Score  0 0 3 0   Difficult doing work/chores Not difficult at all Not difficult at all Not difficult at all Not difficult at all       01/20/2024    8:04 AM 12/30/2022    2:16 PM 07/12/2022    5:13 PM 04/24/2021   11:07 AM  GAD 7 : Generalized Anxiety Score  Nervous, Anxious, on Edge 0 0 0 0  Control/stop worrying 0 0 0 0  Worry too much - different things 0 0 0 0  Trouble relaxing 0 0 0 0  Restless 0 0 0 0  Easily annoyed or irritable 0 0 0 0  Afraid - awful might happen 0 0 0 0   Total GAD 7 Score 0 0 0 0  Anxiety Difficulty Not difficult at all Not difficult at all Not difficult at all Not difficult at all   Relevant past medical, surgical, family and social history reviewed and updated as indicated. Interim medical history since our last visit reviewed. Allergies and medications reviewed and updated.  Review of Systems  Constitutional:  Negative for activity change, diaphoresis, fatigue and fever.  Respiratory:  Negative for cough, chest tightness, shortness of breath and wheezing.   Cardiovascular:  Negative for chest pain, palpitations and leg swelling.  Gastrointestinal: Negative.   Endocrine: Negative for cold intolerance, heat intolerance, polydipsia, polyphagia and polyuria.  Neurological: Negative.   Psychiatric/Behavioral: Negative.     Per HPI unless specifically indicated above     Objective:    BP 100/64   Pulse 72   Temp 97.9 F (36.6 C) (Oral)   Wt 250 lb (113.4 kg)   SpO2 92%   BMI 32.98 kg/m   Wt Readings from Last 3 Encounters:  01/20/24 250 lb (113.4 kg)  12/11/23 234 lb 2.1 oz (106.2 kg)  10/27/23 232 lb 3.2 oz (105.3 kg)    Physical Exam Vitals and nursing note reviewed.  Constitutional:      General: He is awake. He is not in acute distress.    Appearance: Normal appearance. He is well-developed and well-groomed. He is obese. He is not ill-appearing or toxic-appearing.     Comments: In manual wheelchair.  HENT:     Head: Normocephalic.     Right Ear: Hearing and external ear normal.     Left Ear: Hearing and external ear normal.  Eyes:     General: Lids are normal.     Extraocular Movements: Extraocular movements intact.     Conjunctiva/sclera: Conjunctivae normal.  Neck:     Thyroid : No thyromegaly.     Vascular: No carotid bruit.  Cardiovascular:     Rate and Rhythm: Normal rate and regular rhythm.     Heart sounds: Normal heart sounds. No murmur heard.    No gallop.  Pulmonary:     Effort: No accessory muscle  usage or respiratory distress.     Breath sounds: Normal breath sounds.  Abdominal:     General: Bowel sounds are normal. There is no distension.     Palpations: Abdomen is soft.     Tenderness: There is no abdominal tenderness.  Musculoskeletal:     Cervical back: Full passive range of motion without pain.     Right lower leg: No edema.     Left lower leg: No edema.  Lymphadenopathy:     Cervical: No cervical adenopathy.  Skin:    General: Skin is warm.     Capillary Refill: Capillary refill takes less than 2 seconds.  Neurological:     Mental Status: He is alert and oriented to person, place, and time.     Deep Tendon Reflexes: Reflexes are normal and symmetric.     Reflex Scores:      Brachioradialis reflexes are 2+ on the right side and 2+ on the left side.      Patellar reflexes are 2+ on the right side and 2+ on the left side. Psychiatric:        Attention and Perception: Attention normal.        Mood and Affect: Mood normal.        Speech: Speech normal.        Behavior: Behavior normal. Behavior is cooperative.        Thought Content: Thought content normal.    Diabetic Foot Exam - Simple   Simple Foot Form Visual Inspection See comments: Yes Sensation Testing Intact to touch and monofilament testing bilaterally: Yes Pulse Check Posterior Tibialis and Dorsalis pulse intact bilaterally: Yes Comments Mild swelling to both ankles.  Thick toenails and dry skin.     Results for orders placed or performed during the hospital encounter of 12/06/23  Comprehensive metabolic panel   Collection Time: 12/06/23  7:40 PM  Result Value Ref Range   Sodium 150 (H) 135 - 145 mmol/L   Potassium 3.8 3.5 - 5.1 mmol/L   Chloride 116 (H) 98 - 111 mmol/L   CO2 23 22 - 32 mmol/L   Glucose, Bld 82 70 - 99 mg/dL   BUN 36 (H) 6 - 20 mg/dL   Creatinine, Ser 8.68 (H) 0.61 - 1.24 mg/dL   Calcium  8.7 (L) 8.9 - 10.3 mg/dL   Total Protein 7.2 6.5 - 8.1 g/dL   Albumin 3.8 3.5 - 5.0 g/dL    AST 63 (H) 15 - 41 U/L   ALT 77 (H) 0 - 44 U/L   Alkaline Phosphatase 70 38 - 126 U/L   Total Bilirubin 0.4 0.0 - 1.2 mg/dL   GFR, Estimated >39 >39 mL/min   Anion gap 11 5 - 15  Lactic acid, plasma   Collection Time: 12/06/23  7:40 PM  Result Value Ref Range   Lactic Acid, Venous 3.1 (HH) 0.5 - 1.9 mmol/L  CBC with Differential   Collection Time: 12/06/23  7:40 PM  Result Value Ref Range   WBC 3.5 (L) 4.0 - 10.5 K/uL   RBC 4.04 (L) 4.22 - 5.81 MIL/uL   Hemoglobin 11.8 (L) 13.0 - 17.0 g/dL   HCT 61.2 (L) 60.9 - 47.9 %   MCV 95.8 80.0 - 100.0 fL   MCH 29.2 26.0 - 34.0 pg   MCHC 30.5 30.0 - 36.0 g/dL   RDW 85.1 88.4 - 84.4 %   Platelets 118 (L) 150 - 400 K/uL   nRBC 0.9 (H) 0.0 - 0.2 %   Neutrophils Relative % 57 %   Neutro Abs 2.0 1.7 - 7.7 K/uL   Lymphocytes Relative 30 %   Lymphs Abs 1.0 0.7 - 4.0 K/uL   Monocytes Relative 8 %   Monocytes Absolute 0.3 0.1 - 1.0 K/uL   Eosinophils Relative 5 %   Eosinophils Absolute 0.2 0.0 - 0.5 K/uL   Basophils Relative 0 %   Basophils Absolute 0.0 0.0 - 0.1 K/uL   Immature Granulocytes 0 %   Abs Immature Granulocytes 0.01 0.00 - 0.07 K/uL  Lactic acid, plasma   Collection Time: 12/06/23  9:48 PM  Result Value Ref Range   Lactic Acid,  Venous 3.0 (HH) 0.5 - 1.9 mmol/L  Blood culture (routine x 2)   Collection Time: 12/06/23  9:52 PM   Specimen: BLOOD LEFT WRIST  Result Value Ref Range   Specimen Description BLOOD LEFT WRIST    Special Requests      BOTTLES DRAWN AEROBIC AND ANAEROBIC Blood Culture adequate volume   Culture      NO GROWTH 5 DAYS Performed at [facility], [address] [place], [place] [address]    Report Status 12/11/2023 FINAL   Blood culture (routine x 2)   Collection Time: 12/06/23 10:05 PM   Specimen: BLOOD RIGHT ARM  Result Value Ref Range   Specimen Description BLOOD RIGHT ARM    Special Requests      BOTTLES DRAWN AEROBIC AND ANAEROBIC Blood Culture adequate volume   Culture      NO  GROWTH 5 DAYS Performed at [facility], [address], [place], [place] [address]    Report Status 12/11/2023 FINAL   Urine Culture   Collection Time: 12/06/23 10:31 PM   Specimen: Urine, Random  Result Value Ref Range   Specimen Description      URINE, RANDOM Performed at [facility], [address] [place], [place] [address]    Special Requests      NONE Reflexed from [id number] Performed at [facility], [address] [place], [place] [address]    Culture >=100,000 COLONIES/mL ESCHERICHIA COLI (A)    Report Status 12/09/2023 FINAL    Organism ID, Bacteria ESCHERICHIA COLI (A)       Susceptibility   Escherichia coli - MIC*    AMPICILLIN >=32 RESISTANT Resistant     CEFAZOLIN <=4 SENSITIVE Sensitive     CEFEPIME  <=0.12 SENSITIVE Sensitive     CEFTRIAXONE  <=0.25 SENSITIVE Sensitive     CIPROFLOXACIN  <=0.25 SENSITIVE Sensitive     GENTAMICIN <=1 SENSITIVE Sensitive     IMIPENEM <=0.25 SENSITIVE Sensitive     NITROFURANTOIN <=16 SENSITIVE Sensitive     TRIMETH /SULFA  <=20 SENSITIVE Sensitive     AMPICILLIN/SULBACTAM 8 SENSITIVE Sensitive     PIP/TAZO <=4 SENSITIVE Sensitive ug/mL    * >=100,000 COLONIES/mL ESCHERICHIA COLI  Urinalysis, w/ Reflex to Culture (Infection Suspected) -Urine, Random   Collection Time: 12/06/23 10:31 PM  Result Value Ref Range   Specimen Source URINE, CATHETERIZED    Color, Urine YELLOW (A) YELLOW   APPearance CLOUDY (A) CLEAR   Specific Gravity, Urine 1.015 1.005 - 1.030   pH 5.0 5.0 - 8.0   Glucose, UA NEGATIVE NEGATIVE mg/dL   Hgb urine dipstick LARGE (A) NEGATIVE   Bilirubin Urine NEGATIVE NEGATIVE   Ketones, ur NEGATIVE NEGATIVE mg/dL   Protein, ur 30 (A) NEGATIVE mg/dL   Nitrite POSITIVE (A) NEGATIVE   Leukocytes,Ua LARGE (A) NEGATIVE   RBC / HPF >50 0 - 5 RBC/hpf   WBC, UA >50 0 - 5 WBC/hpf   Bacteria, UA MANY (A) NONE SEEN   Squamous Epithelial / HPF 0 0 - 5 /HPF   WBC Clumps PRESENT   CBG monitoring,  ED   Collection Time: 12/06/23 11:55 PM  Result Value Ref Range   Glucose-Capillary 98 70 - 99 mg/dL  Hepatitis panel, acute   Collection Time: 12/06/23 11:58 PM  Result Value Ref Range   Hepatitis B Surface Ag NON REACTIVE NON REACTIVE   HCV Ab NON REACTIVE NON REACTIVE   Hep A IgM NON REACTIVE NON REACTIVE   Hep B C IgM  NON REACTIVE NON REACTIVE  Brain natriuretic peptide   Collection Time: 12/06/23 11:58 PM  Result Value Ref Range   B Natriuretic Peptide 40.9 0.0 - 100.0 pg/mL  Basic metabolic panel with GFR   Collection Time: 12/06/23 11:58 PM  Result Value Ref Range   Sodium 150 (H) 135 - 145 mmol/L   Potassium 3.9 3.5 - 5.1 mmol/L   Chloride 116 (H) 98 - 111 mmol/L   CO2 24 22 - 32 mmol/L   Glucose, Bld 81 70 - 99 mg/dL   BUN 35 (H) 6 - 20 mg/dL   Creatinine, Ser 8.66 (H) 0.61 - 1.24 mg/dL   Calcium  8.3 (L) 8.9 - 10.3 mg/dL   GFR, Estimated >39 >39 mL/min   Anion gap 10 5 - 15  CK   Collection Time: 12/06/23 11:58 PM  Result Value Ref Range   Total CK 75 49 - 397 U/L  Protime-INR   Collection Time: 12/06/23 11:58 PM  Result Value Ref Range   Prothrombin Time 16.2 (H) 11.4 - 15.2 seconds   INR 1.3 (H) 0.8 - 1.2  APTT   Collection Time: 12/06/23 11:58 PM  Result Value Ref Range   aPTT 34 24 - 36 seconds  CBC   Collection Time: 12/06/23 11:58 PM  Result Value Ref Range   WBC 2.7 (L) 4.0 - 10.5 K/uL   RBC 3.60 (L) 4.22 - 5.81 MIL/uL   Hemoglobin 10.5 (L) 13.0 - 17.0 g/dL   HCT 65.2 (L) 60.9 - 47.9 %   MCV 96.4 80.0 - 100.0 fL   MCH 29.2 26.0 - 34.0 pg   MCHC 30.3 30.0 - 36.0 g/dL   RDW 84.8 88.4 - 84.4 %   Platelets 103 (L) 150 - 400 K/uL   nRBC 0.7 (H) 0.0 - 0.2 %  Lactate dehydrogenase   Collection Time: 12/06/23 11:58 PM  Result Value Ref Range   LDH 128 98 - 192 U/L  TSH   Collection Time: 12/06/23 11:58 PM  Result Value Ref Range   TSH 1.525 0.350 - 4.500 uIU/mL  T4, free   Collection Time: 12/06/23 11:58 PM  Result Value Ref Range   Free T4  0.51 (L) 0.61 - 1.12 ng/dL  Cortisol   Collection Time: 12/06/23 11:58 PM  Result Value Ref Range   Cortisol, Plasma 2.7 ug/dL  Vitamin B12   Collection Time: 12/06/23 11:58 PM  Result Value Ref Range   Vitamin B-12 1,390 (H) 180 - 914 pg/mL  Folate   Collection Time: 12/06/23 11:58 PM  Result Value Ref Range   Folate 6.2 >5.9 ng/mL  Iron and TIBC   Collection Time: 12/06/23 11:58 PM  Result Value Ref Range   Iron 70 45 - 182 ug/dL   TIBC 768 (L) 749 - 549 ug/dL   Saturation Ratios 30 17.9 - 39.5 %   UIBC 161 ug/dL  Ferritin   Collection Time: 12/06/23 11:58 PM  Result Value Ref Range   Ferritin 133 24 - 336 ng/mL  Reticulocytes   Collection Time: 12/06/23 11:58 PM  Result Value Ref Range   Retic Ct Pct 1.3 0.4 - 3.1 %   RBC. 3.64 (L) 4.22 - 5.81 MIL/uL   Retic Count, Absolute 47.7 19.0 - 186.0 K/uL   Immature Retic Fract 16.7 (H) 2.3 - 15.9 %  HIV Antibody (routine testing w rflx)   Collection Time: 12/06/23 11:58 PM  Result Value Ref Range   HIV Screen 4th Generation wRfx Non Reactive Non Reactive  Procalcitonin   Collection Time: 12/06/23 11:58 PM  Result Value Ref Range   Procalcitonin <0.10 ng/mL  Type and screen [facility]   Collection Time: 12/06/23 11:58 PM  Result Value Ref Range   ABO/RH(D) O POS    Antibody Screen NEG    Sample Expiration      12/09/2023,2359 Performed at [facility], [address] [place], [place] [address]   Lactic acid, plasma   Collection Time: 12/07/23  1:48 AM  Result Value Ref Range   Lactic Acid, Venous 3.3 (HH) 0.5 - 1.9 mmol/L  Technologist smear review   Collection Time: 12/07/23  6:16 AM  Result Value Ref Range   WBC MORPHOLOGY MORPHOLOGY UNREMARKABLE    RBC MORPHOLOGY BURR CELLS    Plt Morphology Normal platelet morphology    Clinical Information Thrombocytopenia and pancytopenia   CBC   Collection Time: 12/07/23  6:17 AM  Result Value Ref Range   WBC 3.0 (L) 4.0 - 10.5 K/uL    RBC 3.70 (L) 4.22 - 5.81 MIL/uL   Hemoglobin 10.9 (L) 13.0 - 17.0 g/dL   HCT 65.0 (L) 60.9 - 47.9 %   MCV 94.3 80.0 - 100.0 fL   MCH 29.5 26.0 - 34.0 pg   MCHC 31.2 30.0 - 36.0 g/dL   RDW 84.8 88.4 - 84.4 %   Platelets 104 (L) 150 - 400 K/uL   nRBC 0.7 (H) 0.0 - 0.2 %  Lactic acid, plasma   Collection Time: 12/07/23  6:17 AM  Result Value Ref Range   Lactic Acid, Venous 0.9 0.5 - 1.9 mmol/L  Glucose, capillary   Collection Time: 12/07/23  8:47 AM  Result Value Ref Range   Glucose-Capillary 70 70 - 99 mg/dL  Basic metabolic panel with GFR   Collection Time: 12/07/23  9:28 AM  Result Value Ref Range   Sodium 149 (H) 135 - 145 mmol/L   Potassium 4.0 3.5 - 5.1 mmol/L   Chloride 114 (H) 98 - 111 mmol/L   CO2 28 22 - 32 mmol/L   Glucose, Bld 71 70 - 99 mg/dL   BUN 30 (H) 6 - 20 mg/dL   Creatinine, Ser 8.67 (H) 0.61 - 1.24 mg/dL   Calcium  8.3 (L) 8.9 - 10.3 mg/dL   GFR, Estimated >39 >39 mL/min   Anion gap 7 5 - 15  CBC   Collection Time: 12/07/23  9:28 AM  Result Value Ref Range   WBC 2.9 (L) 4.0 - 10.5 K/uL   RBC 3.83 (L) 4.22 - 5.81 MIL/uL   Hemoglobin 11.1 (L) 13.0 - 17.0 g/dL   HCT 63.8 (L) 60.9 - 47.9 %   MCV 94.3 80.0 - 100.0 fL   MCH 29.0 26.0 - 34.0 pg   MCHC 30.7 30.0 - 36.0 g/dL   RDW 85.0 88.4 - 84.4 %   Platelets 104 (L) 150 - 400 K/uL   nRBC 1.4 (H) 0.0 - 0.2 %  Lactic acid, plasma   Collection Time: 12/07/23  9:28 AM  Result Value Ref Range   Lactic Acid, Venous 0.9 0.5 - 1.9 mmol/L  Glucose, capillary   Collection Time: 12/07/23 12:06 PM  Result Value Ref Range   Glucose-Capillary 81 70 - 99 mg/dL  Glucose, capillary   Collection Time: 12/07/23  4:14 PM  Result Value Ref Range   Glucose-Capillary 140 (H) 70 - 99 mg/dL  Glucose, capillary   Collection Time: 12/07/23  9:37 PM  Result Value Ref Range  Glucose-Capillary 144 (H) 70 - 99 mg/dL  Glucose, capillary   Collection Time: 12/08/23  7:59 AM  Result Value Ref Range   Glucose-Capillary 137 (H)  70 - 99 mg/dL  CBC with Differential/Platelet   Collection Time: 12/08/23  9:54 AM  Result Value Ref Range   WBC 2.7 (L) 4.0 - 10.5 K/uL   RBC 3.87 (L) 4.22 - 5.81 MIL/uL   Hemoglobin 11.3 (L) 13.0 - 17.0 g/dL   HCT 63.9 (L) 60.9 - 47.9 %   MCV 93.0 80.0 - 100.0 fL   MCH 29.2 26.0 - 34.0 pg   MCHC 31.4 30.0 - 36.0 g/dL   RDW 85.1 88.4 - 84.4 %   Platelets 109 (L) 150 - 400 K/uL   nRBC 0.7 (H) 0.0 - 0.2 %   Neutrophils Relative % 55 %   Neutro Abs 1.5 (L) 1.7 - 7.7 K/uL   Lymphocytes Relative 30 %   Lymphs Abs 0.8 0.7 - 4.0 K/uL   Monocytes Relative 11 %   Monocytes Absolute 0.3 0.1 - 1.0 K/uL   Eosinophils Relative 4 %   Eosinophils Absolute 0.1 0.0 - 0.5 K/uL   Basophils Relative 0 %   Basophils Absolute 0.0 0.0 - 0.1 K/uL   Immature Granulocytes 0 %   Abs Immature Granulocytes 0.01 0.00 - 0.07 K/uL  Basic metabolic panel   Collection Time: 12/08/23  9:54 AM  Result Value Ref Range   Sodium 142 135 - 145 mmol/L   Potassium 4.0 3.5 - 5.1 mmol/L   Chloride 107 98 - 111 mmol/L   CO2 27 22 - 32 mmol/L   Glucose, Bld 197 (H) 70 - 99 mg/dL   BUN 22 (H) 6 - 20 mg/dL   Creatinine, Ser 8.65 (H) 0.61 - 1.24 mg/dL   Calcium  8.3 (L) 8.9 - 10.3 mg/dL   GFR, Estimated >39 >39 mL/min   Anion gap 8 5 - 15  Glucose, capillary   Collection Time: 12/08/23 11:27 AM  Result Value Ref Range   Glucose-Capillary 143 (H) 70 - 99 mg/dL  Glucose, capillary   Collection Time: 12/08/23  4:02 PM  Result Value Ref Range   Glucose-Capillary 106 (H) 70 - 99 mg/dL  Glucose, capillary   Collection Time: 12/08/23  9:11 PM  Result Value Ref Range   Glucose-Capillary 194 (H) 70 - 99 mg/dL  Glucose, capillary   Collection Time: 12/09/23  7:32 AM  Result Value Ref Range   Glucose-Capillary 128 (H) 70 - 99 mg/dL  CBC with Differential/Platelet   Collection Time: 12/09/23 10:00 AM  Result Value Ref Range   WBC 1.9 (L) 4.0 - 10.5 K/uL   RBC 4.03 (L) 4.22 - 5.81 MIL/uL   Hemoglobin 11.8 (L) 13.0 -  17.0 g/dL   HCT 63.0 (L) 60.9 - 47.9 %   MCV 91.6 80.0 - 100.0 fL   MCH 29.3 26.0 - 34.0 pg   MCHC 32.0 30.0 - 36.0 g/dL   RDW 85.0 88.4 - 84.4 %   Platelets 109 (L) 150 - 400 K/uL   nRBC 0.0 0.0 - 0.2 %   Neutrophils Relative % 39 %   Neutro Abs 0.8 (L) 1.7 - 7.7 K/uL   Lymphocytes Relative 44 %   Lymphs Abs 0.9 0.7 - 4.0 K/uL   Monocytes Relative 11 %   Monocytes Absolute 0.2 0.1 - 1.0 K/uL   Eosinophils Relative 5 %   Eosinophils Absolute 0.1 0.0 - 0.5 K/uL   Basophils Relative 1 %  Basophils Absolute 0.0 0.0 - 0.1 K/uL   WBC Morphology MORPHOLOGY UNREMARKABLE    Smear Review Normal platelet morphology    Immature Granulocytes 0 %   Abs Immature Granulocytes 0.00 0.00 - 0.07 K/uL   Burr Cells PRESENT   Basic metabolic panel   Collection Time: 12/09/23 10:00 AM  Result Value Ref Range   Sodium 147 (H) 135 - 145 mmol/L   Potassium 4.2 3.5 - 5.1 mmol/L   Chloride 113 (H) 98 - 111 mmol/L   CO2 26 22 - 32 mmol/L   Glucose, Bld 112 (H) 70 - 99 mg/dL   BUN 14 6 - 20 mg/dL   Creatinine, Ser 8.80 0.61 - 1.24 mg/dL   Calcium  8.7 (L) 8.9 - 10.3 mg/dL   GFR, Estimated >39 >39 mL/min   Anion gap 8 5 - 15  Pathologist smear review   Collection Time: 12/09/23 10:00 AM  Result Value Ref Range   Path Review Blood smear is reviewed.   Glucose, capillary   Collection Time: 12/09/23  6:04 PM  Result Value Ref Range   Glucose-Capillary 190 (H) 70 - 99 mg/dL   Comment 1 Document in Chart   Glucose, capillary   Collection Time: 12/09/23  8:25 PM  Result Value Ref Range   Glucose-Capillary 149 (H) 70 - 99 mg/dL  CBC with Differential/Platelet   Collection Time: 12/10/23  5:09 AM  Result Value Ref Range   WBC 2.1 (L) 4.0 - 10.5 K/uL   RBC 4.09 (L) 4.22 - 5.81 MIL/uL   Hemoglobin 11.6 (L) 13.0 - 17.0 g/dL   HCT 62.0 (L) 60.9 - 47.9 %   MCV 92.7 80.0 - 100.0 fL   MCH 28.4 26.0 - 34.0 pg   MCHC 30.6 30.0 - 36.0 g/dL   RDW 85.2 88.4 - 84.4 %   Platelets 120 (L) 150 - 400 K/uL    nRBC 0.9 (H) 0.0 - 0.2 %   Neutrophils Relative % 52 %   Neutro Abs 1.1 (L) 1.7 - 7.7 K/uL   Lymphocytes Relative 31 %   Lymphs Abs 0.7 0.7 - 4.0 K/uL   Monocytes Relative 9 %   Monocytes Absolute 0.2 0.1 - 1.0 K/uL   Eosinophils Relative 6 %   Eosinophils Absolute 0.1 0.0 - 0.5 K/uL   Basophils Relative 1 %   Basophils Absolute 0.0 0.0 - 0.1 K/uL   Immature Granulocytes 1 %   Abs Immature Granulocytes 0.01 0.00 - 0.07 K/uL  Basic metabolic panel with GFR   Collection Time: 12/10/23  5:09 AM  Result Value Ref Range   Sodium 144 135 - 145 mmol/L   Potassium 4.0 3.5 - 5.1 mmol/L   Chloride 107 98 - 111 mmol/L   CO2 30 22 - 32 mmol/L   Glucose, Bld 95 70 - 99 mg/dL   BUN 13 6 - 20 mg/dL   Creatinine, Ser 8.84 0.61 - 1.24 mg/dL   Calcium  8.9 8.9 - 10.3 mg/dL   GFR, Estimated >39 >39 mL/min   Anion gap 7 5 - 15  Glucose, capillary   Collection Time: 12/10/23  8:25 AM  Result Value Ref Range   Glucose-Capillary 91 70 - 99 mg/dL  Glucose, capillary   Collection Time: 12/10/23 11:52 AM  Result Value Ref Range   Glucose-Capillary 163 (H) 70 - 99 mg/dL  Glucose, capillary   Collection Time: 12/10/23  3:58 PM  Result Value Ref Range   Glucose-Capillary 221 (H) 70 - 99 mg/dL  Glucose,  capillary   Collection Time: 12/10/23  9:02 PM  Result Value Ref Range   Glucose-Capillary 97 70 - 99 mg/dL  CBC   Collection Time: 12/11/23  2:26 AM  Result Value Ref Range   WBC 2.5 (L) 4.0 - 10.5 K/uL   RBC 3.68 (L) 4.22 - 5.81 MIL/uL   Hemoglobin 10.7 (L) 13.0 - 17.0 g/dL   HCT 65.4 (L) 60.9 - 47.9 %   MCV 93.8 80.0 - 100.0 fL   MCH 29.1 26.0 - 34.0 pg   MCHC 31.0 30.0 - 36.0 g/dL   RDW 84.8 88.4 - 84.4 %   Platelets 118 (L) 150 - 400 K/uL   nRBC 0.8 (H) 0.0 - 0.2 %  Basic metabolic panel   Collection Time: 12/11/23  2:26 AM  Result Value Ref Range   Sodium 142 135 - 145 mmol/L   Potassium 4.2 3.5 - 5.1 mmol/L   Chloride 106 98 - 111 mmol/L   CO2 28 22 - 32 mmol/L   Glucose, Bld 175  (H) 70 - 99 mg/dL   BUN 13 6 - 20 mg/dL   Creatinine, Ser 8.68 (H) 0.61 - 1.24 mg/dL   Calcium  8.5 (L) 8.9 - 10.3 mg/dL   GFR, Estimated >39 >39 mL/min   Anion gap 8 5 - 15  Glucose, capillary   Collection Time: 12/11/23  8:57 AM  Result Value Ref Range   Glucose-Capillary 120 (H) 70 - 99 mg/dL  Glucose, capillary   Collection Time: 12/11/23 12:56 PM  Result Value Ref Range   Glucose-Capillary 165 (H) 70 - 99 mg/dL      Assessment & Plan:   Problem List Items Addressed This Visit       Cardiovascular and Mediastinum   Hypertension associated with diabetes (HCC)   Chronic, stable.  BP well below goal in office today for stroke prevention. Concern for orthostatic changes as is on lower side and he is doing current PT.  Will reduce Amlodipine  to 5 MG and maintain remainder of regimen.  Adjust further as needed.  Recommend checking BP daily at home and documenting for provider. He has a BP cuff available at his home. LABS: CBC, CMP, and urine ALB.  Refills up to date.        Relevant Medications   amLODipine  (NORVASC ) 5 MG tablet   aspirin  EC (ASPIR-LOW) 81 MG tablet   atorvastatin  (LIPITOR ) 80 MG tablet   carvedilol  (COREG ) 6.25 MG tablet   lisinopril  (ZESTRIL ) 5 MG tablet   metFORMIN  (GLUCOPHAGE -XR) 500 MG 24 hr tablet   Other Relevant Orders   Bayer DCA Hb A1c Waived   Microalbumin, Urine Waived   Comprehensive metabolic panel with GFR   Chronic diastolic CHF (congestive heart failure) (HCC)   Chronic, stable.  Recommend they schedule follow-up with cardiology.  Noted diagnosis on hospital list.  Last echo was 2022 with EF 50-55%.  Euvolemic today.  Recommend: - Reminded to call for an overnight weight gain of >2 pounds or a weekly weight gain of >5 pounds - not adding salt to food and read food labels. Reviewed the importance of keeping daily sodium intake to 2000mg  daily.  - Avoid Ibuprofen  medications      Relevant Medications   amLODipine  (NORVASC ) 5 MG tablet    aspirin  EC (ASPIR-LOW) 81 MG tablet   atorvastatin  (LIPITOR ) 80 MG tablet   carvedilol  (COREG ) 6.25 MG tablet   lisinopril  (ZESTRIL ) 5 MG tablet   Other Relevant Orders   Microalbumin, Urine  Waived   Comprehensive metabolic panel with GFR   Cardiomyopathy (HCC)   Stable, followed by cardiology, continue this collaboration.  Recommend they schedule follow-up with cardiology, his caregiver (brother) has been unable to get him there.      Relevant Medications   amLODipine  (NORVASC ) 5 MG tablet   aspirin  EC (ASPIR-LOW) 81 MG tablet   atorvastatin  (LIPITOR ) 80 MG tablet   carvedilol  (COREG ) 6.25 MG tablet   lisinopril  (ZESTRIL ) 5 MG tablet     Endocrine   Type 2 diabetes mellitus with proteinuria (HCC) - Primary   Chronic, ongoing.  A1c 5.5% last visit -- recheck today.  Urine ALB sample today.  Continue Metformin  XR 500 MG BID, although may need to adjust dependent sugar levels.  Due to history of hypoglycemia, which he has had in past, will avoid restart of Glipizide  at this time and if elevation consider SGLT or GLP1 add on -- would be cautious with SGLT2 due to urinary incontinence at baseline. Continue Gabapentin  for discomfort. Recommend he focus on diet, as still tends to snack a lot at home, and avoid alcohol use. Continue checking BS daily at home.   - Statin and ACE on board - Vaccinations up to date - Recommend he schedule eye exam.  Foot exam up to date.      Relevant Medications   aspirin  EC (ASPIR-LOW) 81 MG tablet   atorvastatin  (LIPITOR ) 80 MG tablet   lisinopril  (ZESTRIL ) 5 MG tablet   metFORMIN  (GLUCOPHAGE -XR) 500 MG 24 hr tablet   Other Relevant Orders   Bayer DCA Hb A1c Waived   Microalbumin, Urine Waived   Comprehensive metabolic panel with GFR   Type 2 diabetes mellitus with diabetic polyneuropathy (HCC)   Refer to diabetes with proteinuria plan of care.      Relevant Medications   aspirin  EC (ASPIR-LOW) 81 MG tablet   atorvastatin  (LIPITOR ) 80 MG tablet    citalopram  (CELEXA ) 10 MG tablet   donepezil  (ARICEPT ) 5 MG tablet   gabapentin  (NEURONTIN ) 300 MG capsule   lisinopril  (ZESTRIL ) 5 MG tablet   metFORMIN  (GLUCOPHAGE -XR) 500 MG 24 hr tablet   Other Relevant Orders   Bayer DCA Hb A1c Waived   Hyperlipidemia associated with type 2 diabetes mellitus (HCC)   Chronic, ongoing.  Continue current medication regimen and adjust as needed.  Lipid panel today.  Refills up to date.       Relevant Medications   amLODipine  (NORVASC ) 5 MG tablet   aspirin  EC (ASPIR-LOW) 81 MG tablet   atorvastatin  (LIPITOR ) 80 MG tablet   carvedilol  (COREG ) 6.25 MG tablet   lisinopril  (ZESTRIL ) 5 MG tablet   metFORMIN  (GLUCOPHAGE -XR) 500 MG 24 hr tablet   Other Relevant Orders   Bayer DCA Hb A1c Waived   Comprehensive metabolic panel with GFR   Lipid Panel w/o Chol/HDL Ratio   Diabetes mellitus treated with oral medication (HCC)   Refer to diabetes with proteinuria plan of care.      Relevant Medications   aspirin  EC (ASPIR-LOW) 81 MG tablet   atorvastatin  (LIPITOR ) 80 MG tablet   lisinopril  (ZESTRIL ) 5 MG tablet   metFORMIN  (GLUCOPHAGE -XR) 500 MG 24 hr tablet   Other Relevant Orders   Bayer DCA Hb A1c Waived     Nervous and Auditory   Chronic communicating hydrocephalus (HCC)   Ongoing, continue collaboration with neurology as needed and current memory medication as ordered by them in past.  Recommend they schedule follow-up with neurology, his caregiver (brother) has  been unable to get him there.        Hematopoietic and Hemostatic   Thrombocytopenia (HCC)   Noted on past labs, will recheck today and monitor closely as takes ASA and Plavix .      Relevant Orders   CBC with Differential/Platelet     Other   Poor mobility   Chronic, ongoing due to past CVA.  Continue use of walker and W/C as needed.  Currently working with PT which is offering benefit.      Obesity   BMI 32.98 with T2DM, HTN.  Maintaining weight loss. Recommended eating smaller  high protein, low fat meals more frequently and exercising 30 mins a day 5 times a week with a goal of 10-15lb weight loss in the next 3 months. Patient voiced their understanding and motivation to adhere to these recommendations.  Recommend continue cut back on alcohol.      Relevant Medications   metFORMIN  (GLUCOPHAGE -XR) 500 MG 24 hr tablet   History of stroke   Continue collaboration with neurology, recommend they return for visit with them.  Discussed goals with patient LDL <55, A1c <6.5%, and BP <130/80.        Depression   Chronic, ongoing.  Denies SI/HI.  Continue Celexa  as offers benefit and adjust as needed.      Relevant Medications   citalopram  (CELEXA ) 10 MG tablet   Other Visit Diagnoses       Pneumococcal vaccination given       PCV20 in office today, educated on this.   Relevant Orders   Pneumococcal conjugate vaccine 20-valent (Completed)        Follow up plan: Return in about 6 months (around 07/22/2024) for T2DM, HTN/HLD, BPH, MOOD, HF.

## 2024-01-20 NOTE — Assessment & Plan Note (Signed)
 Refer to diabetes with proteinuria plan of care.

## 2024-01-20 NOTE — Assessment & Plan Note (Signed)
 Chronic, stable.  BP well below goal in office today for stroke prevention. Concern for orthostatic changes as is on lower side and he is doing current PT.  Will reduce Amlodipine  to 5 MG and maintain remainder of regimen.  Adjust further as needed.  Recommend checking BP daily at home and documenting for provider. He has a BP cuff available at his home. LABS: CBC, CMP, and urine ALB.  Refills up to date.

## 2024-01-20 NOTE — Assessment & Plan Note (Addendum)
 Chronic, ongoing.  Denies SI/HI.  Continue Celexa  as offers benefit and adjust as needed.

## 2024-01-21 ENCOUNTER — Ambulatory Visit: Payer: Self-pay | Admitting: Nurse Practitioner

## 2024-01-21 DIAGNOSIS — M6259 Muscle wasting and atrophy, not elsewhere classified, multiple sites: Secondary | ICD-10-CM | POA: Diagnosis not present

## 2024-01-21 DIAGNOSIS — I259 Chronic ischemic heart disease, unspecified: Secondary | ICD-10-CM

## 2024-01-21 DIAGNOSIS — Z7982 Long term (current) use of aspirin: Secondary | ICD-10-CM

## 2024-01-21 DIAGNOSIS — I5032 Chronic diastolic (congestive) heart failure: Secondary | ICD-10-CM | POA: Diagnosis not present

## 2024-01-21 DIAGNOSIS — F329 Major depressive disorder, single episode, unspecified: Secondary | ICD-10-CM

## 2024-01-21 DIAGNOSIS — Z9181 History of falling: Secondary | ICD-10-CM

## 2024-01-21 DIAGNOSIS — N3281 Overactive bladder: Secondary | ICD-10-CM

## 2024-01-21 DIAGNOSIS — D696 Thrombocytopenia, unspecified: Secondary | ICD-10-CM

## 2024-01-21 DIAGNOSIS — Z5982 Transportation insecurity: Secondary | ICD-10-CM

## 2024-01-21 DIAGNOSIS — E1142 Type 2 diabetes mellitus with diabetic polyneuropathy: Secondary | ICD-10-CM

## 2024-01-21 DIAGNOSIS — Z7902 Long term (current) use of antithrombotics/antiplatelets: Secondary | ICD-10-CM

## 2024-01-21 DIAGNOSIS — F0393 Unspecified dementia, unspecified severity, with mood disturbance: Secondary | ICD-10-CM

## 2024-01-21 DIAGNOSIS — E1151 Type 2 diabetes mellitus with diabetic peripheral angiopathy without gangrene: Secondary | ICD-10-CM

## 2024-01-21 DIAGNOSIS — I69351 Hemiplegia and hemiparesis following cerebral infarction affecting right dominant side: Secondary | ICD-10-CM | POA: Diagnosis not present

## 2024-01-21 DIAGNOSIS — N401 Enlarged prostate with lower urinary tract symptoms: Secondary | ICD-10-CM

## 2024-01-21 DIAGNOSIS — D519 Vitamin B12 deficiency anemia, unspecified: Secondary | ICD-10-CM

## 2024-01-21 DIAGNOSIS — Z87891 Personal history of nicotine dependence: Secondary | ICD-10-CM

## 2024-01-21 DIAGNOSIS — N390 Urinary tract infection, site not specified: Secondary | ICD-10-CM

## 2024-01-21 DIAGNOSIS — I110 Hypertensive heart disease with heart failure: Secondary | ICD-10-CM | POA: Diagnosis not present

## 2024-01-21 LAB — LIPID PANEL W/O CHOL/HDL RATIO

## 2024-01-21 NOTE — Progress Notes (Signed)
 Contacted via MyChart  Good morning Bonham, waiting on a couple more labs to return, but CBC continues to show slightly low hemoglobin, normal hematocrit.  Ensure you get iron rich foods into diet.  I will let you know if remaining results are concerning once they return. Any questions? Keep being stellar!!  Thank you for allowing me to participate in your care.  I appreciate you. Kindest regards, Ozie Dimaria

## 2024-01-22 ENCOUNTER — Ambulatory Visit: Admitting: Emergency Medicine

## 2024-01-22 VITALS — Ht 73.0 in | Wt 250.0 lb

## 2024-01-22 DIAGNOSIS — Z0001 Encounter for general adult medical examination with abnormal findings: Secondary | ICD-10-CM | POA: Diagnosis not present

## 2024-01-22 DIAGNOSIS — E119 Type 2 diabetes mellitus without complications: Secondary | ICD-10-CM

## 2024-01-22 DIAGNOSIS — Z0000 Encounter for general adult medical examination without abnormal findings: Secondary | ICD-10-CM

## 2024-01-22 DIAGNOSIS — Z7984 Long term (current) use of oral hypoglycemic drugs: Secondary | ICD-10-CM | POA: Diagnosis not present

## 2024-01-22 LAB — LIPID PANEL W/O CHOL/HDL RATIO
Cholesterol, Total: 121 mg/dL (ref 100–199)
HDL: 54 mg/dL
LDL Chol Calc (NIH): 56 mg/dL (ref 0–99)
Triglycerides: 43 mg/dL (ref 0–149)
VLDL Cholesterol Cal: 11 mg/dL (ref 5–40)

## 2024-01-22 LAB — CBC WITH DIFFERENTIAL/PLATELET
Basophils Absolute: 0 x10E3/uL (ref 0.0–0.2)
Basos: 1 %
EOS (ABSOLUTE): 0.2 x10E3/uL (ref 0.0–0.4)
Eos: 5 %
Hematocrit: 39.2 % (ref 37.5–51.0)
Hemoglobin: 12.1 g/dL — ABNORMAL LOW (ref 13.0–17.7)
Immature Grans (Abs): 0 x10E3/uL (ref 0.0–0.1)
Immature Granulocytes: 0 %
Lymphocytes Absolute: 1.3 x10E3/uL (ref 0.7–3.1)
Lymphs: 36 %
MCH: 28.7 pg (ref 26.6–33.0)
MCHC: 30.9 g/dL — ABNORMAL LOW (ref 31.5–35.7)
MCV: 93 fL (ref 79–97)
Monocytes Absolute: 0.4 x10E3/uL (ref 0.1–0.9)
Monocytes: 11 %
Neutrophils Absolute: 1.7 x10E3/uL (ref 1.4–7.0)
Neutrophils: 47 %
Platelets: 256 x10E3/uL (ref 150–450)
RBC: 4.22 x10E6/uL (ref 4.14–5.80)
RDW: 14 % (ref 11.6–15.4)
WBC: 3.6 x10E3/uL (ref 3.4–10.8)

## 2024-01-22 LAB — COMPREHENSIVE METABOLIC PANEL WITH GFR
ALT: 15 IU/L (ref 0–44)
AST: 12 IU/L (ref 0–40)
Albumin: 4.1 g/dL (ref 3.8–4.9)
Alkaline Phosphatase: 67 IU/L (ref 44–121)
BUN/Creatinine Ratio: 14 (ref 9–20)
BUN: 18 mg/dL (ref 6–24)
Bilirubin Total: 0.4 mg/dL (ref 0.0–1.2)
CO2: 22 mmol/L (ref 20–29)
Calcium: 9.2 mg/dL (ref 8.7–10.2)
Chloride: 107 mmol/L — AB (ref 96–106)
Creatinine, Ser: 1.28 mg/dL — AB (ref 0.76–1.27)
Globulin, Total: 2.3 g/dL (ref 1.5–4.5)
Glucose: 113 mg/dL — AB (ref 70–99)
Potassium: 4.3 mmol/L (ref 3.5–5.2)
Sodium: 145 mmol/L — AB (ref 134–144)
Total Protein: 6.4 g/dL (ref 6.0–8.5)
eGFR: 68 mL/min/1.73 (ref >59)

## 2024-01-22 NOTE — Progress Notes (Signed)
 Subjective:   [name] is a 53 y.o. who presents for a Medicare Wellness preventive visit.  As a reminder, Annual Wellness Visits don't include a physical exam, and some assessments may be limited, especially if this visit is performed virtually. We may recommend an in-person follow-up visit with your provider if needed.  Visit Complete: Virtual I connected with  [name] on 01/22/24 by a audio enabled telemedicine application and verified that I am speaking with the correct person using two identifiers.  Patient Location: Home  Provider Location: Home Office  I discussed the limitations of evaluation and management by telemedicine. The patient expressed understanding and agreed to proceed.  Vital Signs: Because this visit was a virtual/telehealth visit, some criteria may be missing or patient reported. Any vitals not documented were not able to be obtained and vitals that have been documented are patient reported.  VideoDeclined- This patient declined Librarian, academic. Therefore the visit was completed with audio only.  Persons Participating in Visit: Patient assisted by Leslee, brother.  AWV Questionnaire: No: Patient Medicare AWV questionnaire was not completed prior to this visit.  Cardiac Risk Factors include: male gender;diabetes mellitus;dyslipidemia;hypertension;obesity (BMI >30kg/m2);sedentary lifestyle     Objective:    Today's Vitals   01/22/24 0839  Weight: 250 lb (113.4 kg)  Height: 6' 1 (1.854 m)   Body mass index is 32.98 kg/m.     01/22/2024    9:03 AM 12/06/2023    7:38 PM 07/26/2023   12:12 PM 01/24/2022    8:48 AM 07/10/2021    8:41 AM 07/13/2020    9:37 AM 03/20/2020    1:58 PM  Advanced Directives  Does Patient Have a Medical Advance Directive? No No No No No No No  Would patient like information on creating a medical advance directive? No - Patient declined   No - Patient declined No - Patient declined No - Patient  declined     Current Medications (verified) Outpatient Encounter Medications as of 01/22/2024  Medication Sig   acetaminophen  (TYLENOL ) 325 MG tablet Take 2 tablets (650 mg total) by mouth every 6 (six) hours as needed for mild pain (pain score 1-3).   amLODipine  (NORVASC ) 5 MG tablet Take 1 tablet (5 mg total) by mouth daily.   aspirin  EC (ASPIR-LOW) 81 MG tablet Take 1 tablet (81 mg total) by mouth daily. Swallow whole.   atorvastatin  (LIPITOR ) 80 MG tablet TAKE 1 TABLET BY MOUTH ONCE DAILY AT 6:00PM   carvedilol  (COREG ) 6.25 MG tablet Take 1 tablet (6.25 mg total) by mouth 2 (two) times daily with a meal.   citalopram  (CELEXA ) 10 MG tablet Take 1 tablet (10 mg total) by mouth daily.   clopidogrel  (PLAVIX ) 75 MG tablet TAKE 1 TABLET BY MOUTH ONCE EVERY MORNING WITH BREAKFAST   cyanocobalamin  (VITAMIN B12) 1000 MCG tablet Take 1 tablet (1,000 mcg total) by mouth daily.   donepezil  (ARICEPT ) 5 MG tablet Take 1 tablet (5 mg total) by mouth daily.   gabapentin  (NEURONTIN ) 300 MG capsule Take 1 capsule (300 mg total) by mouth 3 (three) times daily.   lisinopril  (ZESTRIL ) 5 MG tablet Take 1 tablet (5 mg total) by mouth daily.   metFORMIN  (GLUCOPHAGE -XR) 500 MG 24 hr tablet Take 1 tablet (500 mg total) by mouth 2 (two) times daily with a meal.   Multiple Vitamin (MULTIVITAMIN) tablet Take 1 tablet by mouth daily.   oxybutynin  (DITROPAN  XL) 10 MG 24 hr tablet Take 1 tablet (  10 mg total) by mouth at bedtime.   pantoprazole  (PROTONIX ) 40 MG tablet Take 1 tablet (40 mg total) by mouth daily.   tamsulosin  (FLOMAX ) 0.4 MG CAPS capsule Take 1 capsule (0.4 mg total) by mouth daily after supper.   Accu-Chek Softclix Lancets lancets USE 2-3 TIMES A DAY AS DIRECTED (Patient not taking: Reported on 01/22/2024)   Blood Glucose Monitoring Suppl (CONTOUR NEXT MONITOR) w/Device KIT Use to check blood sugar 3 times daily, fasting in morning with goal <130 and 2 hours after meals with goal <180.  Bring blood sugar log  to visits.  Dx. E11.42 (Patient not taking: Reported on 01/22/2024)   Blood Pressure Monitoring (BLOOD PRESSURE MONITOR AUTOMAT) DEVI Use to monitor blood pressure once daily. (Patient not taking: Reported on 01/22/2024)   glucose blood (ACCU-CHEK GUIDE TEST) test strip Use as instructed (Patient not taking: Reported on 01/22/2024)   No facility-administered encounter medications on file as of 01/22/2024.    Allergies (verified) Patient has no known allergies.   History: Past Medical History:  Diagnosis Date   Depression    Diabetes mellitus without complication (HCC)    Diastolic dysfunction    a. 09/2020 Echo: EF 50-55%, no rwma, GrII DD. Nl RV size/fxn.  Mildly dil LA.   Hydrocephalus in adult [facility])    Hyperlipidemia    Hypertension    Seizures (HCC)    Stroke [facility])    Past Surgical History:  Procedure Laterality Date   COLONOSCOPY WITH PROPOFOL  N/A 03/08/2020   Procedure: COLONOSCOPY WITH PROPOFOL ;  Surgeon: Janalyn Keene NOVAK, MD;  Location: [facility] ENDOSCOPY;  Service: Endoscopy;  Laterality: N/A;   ESOPHAGOGASTRODUODENOSCOPY N/A 12/09/2023   Procedure: EGD (ESOPHAGOGASTRODUODENOSCOPY);  Surgeon: Jinny Carmine, MD;  Location: [facility] ENDOSCOPY;  Service: Endoscopy;  Laterality: N/A;   LOOP RECORDER IMPLANT  12/20/13   MDT LinQ implanted by Dr Waddell for cryptogenic stroke   LOOP RECORDER IMPLANT N/A 12/20/2013   Procedure: LOOP RECORDER IMPLANT;  Surgeon: Danelle LELON Waddell, MD;  Location: [facility] CATH LAB;  Service: Cardiovascular;  Laterality: N/A;   Family History  Problem Relation Age of Onset   Hypertension Mother    Diabetes Mother    Stroke Father    Heart disease Father    Hypertension Father    Stroke Brother    Arthritis Brother    Diabetes Maternal Grandfather    Social History   Socioeconomic History   Marital status: Single    Spouse name: Not on file   Number of children: 0   Years of education: Not on file   Highest education level: Not on file  Occupational History    Occupation: disability  Tobacco Use   Smoking status: Former    Current packs/day: 0.00    Average packs/day: 1 pack/day for 7.0 years (7.0 ttl pk-yrs)    Types: Cigarettes    Start date: 61    Quit date: 1997    Years since quitting: 28.5   Smokeless tobacco: Never   Tobacco comments:    > 4 years quit  Vaping Use   Vaping status: Never Used  Substance and Sexual Activity   Alcohol use: Yes    Comment: 1 drink monthly or less   Drug use: No   Sexual activity: Not Currently  Other Topics Concern   Not on file  Social History Narrative   Not on file   Social Drivers of Health   Financial Resource Strain: Low Risk  (01/22/2024)   Overall Financial Resource  Strain (CARDIA)    Difficulty of Paying Living Expenses: Not hard at all  Food Insecurity: No Food Insecurity (01/22/2024)   Hunger Vital Sign    Worried About Running Out of Food in the Last Year: Never true    Ran Out of Food in the Last Year: Never true  Transportation Needs: No Transportation Needs (01/22/2024)   PRAPARE - Administrator, Civil Service (Medical): No    Lack of Transportation (Non-Medical): No  Physical Activity: Insufficiently Active (01/22/2024)   Exercise Vital Sign    Days of Exercise per Week: 2 days    Minutes of Exercise per Session: 60 min  Stress: No Stress Concern Present (01/22/2024)   [employer] of Occupational Health - Occupational Stress Questionnaire    Feeling of Stress: Not at all  Social Connections: Socially Isolated (01/22/2024)   Social Connection and Isolation Panel    Frequency of Communication with Friends and Family: More than three times a week    Frequency of Social Gatherings with Friends and Family: More than three times a week    Attends Religious Services: Never    Database administrator or Organizations: No    Attends Engineer, structural: Never    Marital Status: Never married    Tobacco Counseling Counseling given: Not  Answered Tobacco comments: > 4 years quit    Clinical Intake:  Pre-visit preparation completed: Yes  Pain : No/denies pain     BMI - recorded: 32.98 Nutritional Status: BMI > 30  Obese Nutritional Risks: None Diabetes: Yes CBG done?: No Did pt. bring in CBG monitor from home?: No  Lab Results  Component Value Date   HGBA1C 6.3 (H) 01/20/2024   HGBA1C 6.0 (H) 10/27/2023   HGBA1C 5.5 12/30/2022     How often do you need to have someone help you when you read instructions, pamphlets, or other written materials from your doctor or pharmacy?: 4 - Often (Dontae, brother, helps with reading and explaining) What is the last grade level you completed in school?: 12th  Interpreter Needed?: No  Information entered by :: Vina Ned, CMA   Activities of Daily Living     01/22/2024    8:47 AM 12/07/2023    5:29 AM  In your present state of health, do you have any difficulty performing the following activities:  Hearing? 0   Vision? 0   Difficulty concentrating or making decisions? 1   Comment hx of stroke   Walking or climbing stairs? 1   Comment uses wheelchair or rolling walker   Dressing or bathing? 1   Comment brothers help with ADLS and has an aide   Doing errands, shopping? 1 0  Comment doesn't drive, uses Dial a Chief Executive Officer and eating ? Y   Comment brothers help with ADLS and has an aide   Using the Toilet? Y   Comment brothers help with ADLS and has an aide   In the past six months, have you accidently leaked urine? Y   Comment wears depend   Do you have problems with loss of bowel control? Y   Comment wears depend   Managing your Medications? Y   Comment Aid and brother manage medications   Managing your Finances? Y   Comment Dontae, POA and brother manages medication   Housekeeping or managing your Housekeeping? Y   Comment brothers help with ADLS and has an aide     Patient Care Team:  Valerio Melanie DASEN, NP as PCP - General (Nurse  Practitioner) Ezzard Rolin BIRCH, LCSW as Social Worker (Licensed Clinical Social Worker)  I have updated your Care Teams any recent Medical Services you may have received from other providers in the past year.     Assessment:   This is a routine wellness examination for Tayo.  Hearing/Vision screen Hearing Screening - Comments:: Denies hearing loss  Vision Screening - Comments:: Needs DM eye exam, placed referral to [facility]   Goals Addressed             This Visit's Progress    Patient Stated       Continue physical therapy and work on improving mobility       Depression Screen     01/22/2024    9:00 AM 01/20/2024    8:04 AM 10/27/2023    2:45 PM 12/30/2022    2:15 PM 07/12/2022    5:13 PM 09/10/2021    2:51 PM 07/10/2021    9:14 AM  PHQ 2/9 Scores  PHQ - 2 Score 0 0  0 1 0 0  PHQ- 9 Score 0 0  0 3 0   Exception Documentation   Patient refusal        Fall Risk     01/22/2024    9:06 AM 01/20/2024    8:04 AM 10/27/2023    2:45 PM 07/10/2021    8:41 AM 04/24/2021   11:02 AM  Fall Risk   Falls in the past year? 1 1 1  0 1  Number falls in past yr: 1 1 1  0 0  Injury with Fall? 0 0 1 0 0  Risk for fall due to : History of fall(s);Impaired balance/gait;Orthopedic patient;Impaired mobility History of fall(s);Impaired balance/gait;Impaired mobility History of fall(s);Impaired balance/gait;Impaired mobility  Impaired balance/gait;Impaired mobility;History of fall(s)  Follow up Falls evaluation completed;Education provided Falls evaluation completed  Falls evaluation completed;Education provided;Falls prevention discussed  Falls evaluation completed      Data saved with a previous flowsheet row definition    MEDICARE RISK AT HOME:  Medicare Risk at Home Any stairs in or around the home?: Yes (3-4 steps) If so, are there any without handrails?: No Home free of loose throw rugs in walkways, pet beds, electrical cords, etc?: Yes Adequate lighting in your home to reduce risk of  falls?: Yes Life alert?: No Use of a cane, walker or w/c?: Yes (wheelchair and rolling walker) Grab bars in the bathroom?: No Shower chair or bench in shower?: Yes Elevated toilet seat or a handicapped toilet?: Yes  TIMED UP AND GO:  Was the test performed?  No  Cognitive Function: 6CIT completed        01/22/2024    9:08 AM 07/12/2022    5:12 PM 04/03/2021    6:02 PM  6CIT Screen  What Year? 0 points 0 points 0 points  What month? 0 points 0 points 0 points  What time? 3 points 0 points 0 points  Count back from 20 4 points 0 points 2 points  Months in reverse 4 points 2 points 4 points  Repeat phrase 10 points 2 points 0 points  Total Score 21 points 4 points 6 points    Immunizations Immunization History  Administered Date(s) Administered   Influenza,inj,Quad PF,6+ Mos 09/19/2015, 08/12/2018, 04/10/2022   PFIZER(Purple Top)SARS-COV-2 Vaccination 11/19/2019, 12/24/2019   PNEUMOCOCCAL CONJUGATE-20 01/20/2024   Pneumococcal Polysaccharide-23 10/20/2006   Tdap 10/16/2015    Screening Tests Health Maintenance  Topic  Date Due   OPHTHALMOLOGY EXAM  02/26/2022   Diabetic kidney evaluation - Urine ACR  01/07/2024   Zoster Vaccines- Shingrix (1 of 2) 01/24/2024 (Originally 08/28/2022)   COVID-19 Vaccine (3 - 2024-25 season) 01/26/2024 (Originally 03/09/2023)   Hepatitis B Vaccines (1 of 3 - 19+ 3-dose series) 01/09/2025 (Originally [birth year]1991)   INFLUENZA VACCINE  02/06/2024   HEMOGLOBIN A1C  07/22/2024   Diabetic kidney evaluation - eGFR measurement  01/19/2025   FOOT EXAM  01/19/2025   Medicare Annual Wellness (AWV)  01/21/2025   DTaP/Tdap/Td (2 - Td or Tdap) 10/15/2025   Colonoscopy  03/08/2030   Pneumococcal Vaccine 97-82 Years old  Completed   Hepatitis C Screening  Completed   HIV Screening  Completed   HPV VACCINES  Aged Out   Meningococcal B Vaccine  Aged Out    Health Maintenance  Health Maintenance Due  Topic Date Due   OPHTHALMOLOGY EXAM  02/26/2022    Diabetic kidney evaluation - Urine ACR  01/07/2024   Health Maintenance Items Addressed: Referral sent to Optometry/Ophthalmology, See Nurse Notes at the end of this note  Additional Screening:  Vision Screening: Recommended annual ophthalmology exams for early detection of glaucoma and other disorders of the eye. Would you like a referral to an eye doctor? Yes    Dental Screening: Recommended annual dental exams for proper oral hygiene  Community Resource Referral / Chronic Care Management: CRR required this visit?  No   CCM required this visit?  No   Plan:    I have personally reviewed and noted the following in the patient's chart:   Medical and social history Use of alcohol, tobacco or illicit drugs  Current medications and supplements including opioid prescriptions. Patient is not currently taking opioid prescriptions. Functional ability and status Nutritional status Physical activity Advanced directives List of other physicians Hospitalizations, surgeries, and ER visits in previous 12 months Vitals Screenings to include cognitive, depression, and falls Referrals and appointments  In addition, I have reviewed and discussed with patient certain preventive protocols, quality metrics, and best practice recommendations. A written personalized care plan for preventive services as well as general preventive health recommendations were provided to patient.   Vina Ned, CMA   01/22/2024   After Visit Summary: (MyChart) Due to this being a telephonic visit, the after visit summary with patients personalized plan was offered to patient via MyChart   Notes:  6 CIT Score - 21 Patient's brother, Dontae (POA) assisted with today's visit Placed referral to [facility], [place] for a diabetic eye exam Declined DM & Nutrition education referral Declined Covid and Shingles vaccines

## 2024-01-22 NOTE — Patient Instructions (Signed)
 Mr. [name] , Thank you for taking time out of your busy schedule to complete your Annual Wellness Visit with me. I enjoyed our conversation and look forward to speaking with you again next year. I, as well as your care team,  appreciate your ongoing commitment to your health goals. Please review the following plan we discussed and let me know if I can assist you in the future. Your Game plan/ To Do List    Referrals: If you haven't heard from the office you've been referred to, please reach out to them at the phone provided.   Eye Doctor: [facility] [address] [place] [address] Ph [phone]   Follow up Visits: Next Medicare AWV with our clinical staff: 02/03/25 @ 11:20am (PHONE VISIT)   Have you seen your provider in the last 6 months (3 months if uncontrolled diabetes)? Yes Next Office Visit with your provider: 04/27/24 @ 2:00 with [name] Cannady, NP  Clinician Recommendations:  Aim for 30 minutes of exercise or brisk walking, 6-8 glasses of water, and 5 servings of fruits and vegetables each day.       This is a list of the screening recommended for you and due dates:  Health Maintenance  Topic Date Due   Eye exam for diabetics  02/26/2022   Yearly kidney health urinalysis for diabetes  01/07/2024   Zoster (Shingles) Vaccine (1 of 2) 01/24/2024*   COVID-19 Vaccine (3 - 2024-25 season) 01/26/2024*   Hepatitis B Vaccine (1 of 3 - 19+ 3-dose series) 01/09/2025*   Flu Shot  02/06/2024   Hemoglobin A1C  07/22/2024   Yearly kidney function blood test for diabetes  01/19/2025   Complete foot exam   01/19/2025   Medicare Annual Wellness Visit  01/21/2025   DTaP/Tdap/Td vaccine (2 - Td or Tdap) 10/15/2025   Colon Cancer Screening  03/08/2030   Pneumococcal Vaccination  Completed   Hepatitis C Screening  Completed   HIV Screening  Completed   HPV Vaccine  Aged Out   Meningitis B Vaccine  Aged Out  *Topic was postponed. The date shown is not the original due  date.    Advanced directives: (Declined) Advance directive discussed with you today. Even though you declined this today, please call our office should you change your mind, and we can give you the proper paperwork for you to fill out. Advance Care Planning is important because it:  [x]  Makes sure you receive the medical care that is consistent with your values, goals, and preferences  [x]  It provides guidance to your family and loved ones and reduces their decisional burden about whether or not they are making the right decisions based on your wishes.  Follow the link provided in your after visit summary or read over the paperwork we have mailed to you to help you started getting your Advance Directives in place. If you need assistance in completing these, please reach out to us  so that we can help you!  See attachments for Preventive Care and Fall Prevention Tips.   Fall Prevention in the Home, Adult Falls can cause injuries and affect people of all ages. There are many simple things that you can do to make your home safe and to help prevent falls. If you need it, ask for help making these changes. What actions can I take to prevent falls? General information Use good lighting in all rooms. Make sure to: Replace any light bulbs that burn out. Turn on lights if it is  dark and use night-lights. Keep items that you use often in easy-to-reach places. Lower the shelves around your home if needed. Move furniture so that there are clear paths around it. Do not keep throw rugs or other things on the floor that can make you trip. If any of your floors are uneven, fix them. Add color or contrast paint or tape to clearly mark and help you see: Grab bars or handrails. First and last steps of staircases. Where the edge of each step is. If you use a ladder or stepladder: Make sure that it is fully opened. Do not climb a closed ladder. Make sure the sides of the ladder are locked in place. Have  someone hold the ladder while you use it. Know where your pets are as you move through your home. What can I do in the bathroom?     Keep the floor dry. Clean up any water that is on the floor right away. Remove soap buildup in the bathtub or shower. Buildup makes bathtubs and showers slippery. Use non-skid mats or decals on the floor of the bathtub or shower. Attach bath mats securely with double-sided, non-slip rug tape. If you need to sit down while you are in the shower, use a non-slip stool. Install grab bars by the toilet and in the bathtub and shower. Do not use towel bars as grab bars. What can I do in the bedroom? Make sure that you have a light by your bed that is easy to reach. Do not use any sheets or blankets on your bed that hang to the floor. Have a firm bench or chair with side arms that you can use for support when you get dressed. What can I do in the kitchen? Clean up any spills right away. If you need to reach something above you, use a sturdy step stool that has a grab bar. Keep electrical cables out of the way. Do not use floor polish or wax that makes floors slippery. What can I do with my stairs? Do not leave anything on the stairs. Make sure that you have a light switch at the top and the bottom of the stairs. Have them installed if you do not have them. Make sure that there are handrails on both sides of the stairs. Fix handrails that are broken or loose. Make sure that handrails are as long as the staircases. Install non-slip stair treads on all stairs in your home if they do not have carpet. Avoid having throw rugs at the top or bottom of stairs, or secure the rugs with carpet tape to prevent them from moving. Choose a carpet design that does not hide the edge of steps on the stairs. Make sure that carpet is firmly attached to the stairs. Fix any carpet that is loose or worn. What can I do on the outside of my home? Use bright outdoor lighting. Repair the  edges of walkways and driveways and fix any cracks. Clear paths of anything that can make you trip, such as tools or rocks. Add color or contrast paint or tape to clearly mark and help you see high doorway thresholds. Trim any bushes or trees on the main path into your home. Check that handrails are securely fastened and in good repair. Both sides of all steps should have handrails. Install guardrails along the edges of any raised decks or porches. Have leaves, snow, and ice cleared regularly. Use sand, salt, or ice melt on walkways during winter months if  you live where there is ice and snow. In the garage, clean up any spills right away, including grease or oil spills. What other actions can I take? Review your medicines with your health care provider. Some medicines can make you confused or feel dizzy. This can increase your chance of falling. Wear closed-toe shoes that fit well and support your feet. Wear shoes that have rubber soles and low heels. Use a cane, walker, scooter, or crutches that help you move around if needed. Talk with your provider about other ways that you can decrease your risk of falls. This may include seeing a physical therapist to learn to do exercises to improve movement and strength. Where to find more information Centers for Disease Control and Prevention, STEADI: TonerPromos.no [employer] on Aging: BaseRingTones.pl National Institute on Aging: BaseRingTones.pl Contact a health care provider if: You are afraid of falling at home. You feel weak, drowsy, or dizzy at home. You fall at home. Get help right away if you: Lose consciousness or have trouble moving after a fall. Have a fall that causes a head injury. These symptoms may be an emergency. Get help right away. Call 911. Do not wait to see if the symptoms will go away. Do not drive yourself to the hospital. This information is not intended to replace advice given to you by your health care provider. Make sure you  discuss any questions you have with your health care provider. Document Revised: 02/25/2022 Document Reviewed: 02/25/2022 Elsevier Patient Education  2024 [employer].

## 2024-01-27 ENCOUNTER — Telehealth: Payer: Self-pay

## 2024-01-27 NOTE — Telephone Encounter (Signed)
 OK for verbal orders?

## 2024-01-27 NOTE — Telephone Encounter (Signed)
Called and gave verbal orders per Dr. Wynetta Emery.

## 2024-01-27 NOTE — Telephone Encounter (Signed)
 Copied from CRM [id number]. Topic: Clinical - Home Health Verbal Orders >> Jan 27, 2024  3:47 PM Emylou G wrote: Caller/Agency: Andree w/[facility] Callback Number: [phone] secured vmail Service Requested: Socialwork Frequency: 2 addl visits ( 1 per week ) Any new concerns about the patient? No

## 2024-02-03 ENCOUNTER — Ambulatory Visit (LOCAL_COMMUNITY_HEALTH_CENTER)

## 2024-02-03 DIAGNOSIS — Z111 Encounter for screening for respiratory tuberculosis: Secondary | ICD-10-CM

## 2024-02-03 NOTE — Progress Notes (Signed)
 Pt brought in by care provider for PPD placement for nursing home placement.  Tuberculin skin test applied to right ventral forearm.   Explained testing and not to place creams or other agents on PPD testing site as well as not to rub, mash or place bandage/wraps on site.    Pt caregiver scheduled follow-up appointment for reading on 02/05/2024.  Kandi KATHEE Glatter, RN

## 2024-02-05 ENCOUNTER — Ambulatory Visit (LOCAL_COMMUNITY_HEALTH_CENTER)

## 2024-02-05 DIAGNOSIS — Z111 Encounter for screening for respiratory tuberculosis: Secondary | ICD-10-CM

## 2024-02-05 LAB — TB SKIN TEST
Induration: 0 mm
TB Skin Test: NEGATIVE

## 2024-02-09 ENCOUNTER — Telehealth: Payer: Self-pay

## 2024-02-09 NOTE — Telephone Encounter (Unsigned)
 Copied from CRM [id number]. Topic: Clinical - Home Health Verbal Orders >> Feb 09, 2024 12:46 PM Myrick T wrote: Caller/Agency: Medford from [facility] Callback Number: [phone] Service Requested: Physical Therapy Frequency: 2x4w for Gait Training as patient has supportive boots Any new concerns about the patient? Medford needs to know where things are with the [facility] wheelchair request that was referred.

## 2024-02-11 NOTE — Telephone Encounter (Signed)
Called and LVM giving verbal orders per [name].  ?

## 2024-03-18 DIAGNOSIS — I5032 Chronic diastolic (congestive) heart failure: Secondary | ICD-10-CM | POA: Diagnosis not present

## 2024-03-18 DIAGNOSIS — N401 Enlarged prostate with lower urinary tract symptoms: Secondary | ICD-10-CM

## 2024-03-18 DIAGNOSIS — I110 Hypertensive heart disease with heart failure: Secondary | ICD-10-CM | POA: Diagnosis not present

## 2024-03-18 DIAGNOSIS — I69351 Hemiplegia and hemiparesis following cerebral infarction affecting right dominant side: Secondary | ICD-10-CM | POA: Diagnosis not present

## 2024-03-18 DIAGNOSIS — F0393 Unspecified dementia, unspecified severity, with mood disturbance: Secondary | ICD-10-CM

## 2024-03-18 DIAGNOSIS — F329 Major depressive disorder, single episode, unspecified: Secondary | ICD-10-CM

## 2024-03-18 DIAGNOSIS — N3281 Overactive bladder: Secondary | ICD-10-CM

## 2024-03-18 DIAGNOSIS — M6259 Muscle wasting and atrophy, not elsewhere classified, multiple sites: Secondary | ICD-10-CM | POA: Diagnosis not present

## 2024-03-18 DIAGNOSIS — E1151 Type 2 diabetes mellitus with diabetic peripheral angiopathy without gangrene: Secondary | ICD-10-CM

## 2024-03-18 DIAGNOSIS — E1142 Type 2 diabetes mellitus with diabetic polyneuropathy: Secondary | ICD-10-CM

## 2024-03-18 DIAGNOSIS — D696 Thrombocytopenia, unspecified: Secondary | ICD-10-CM

## 2024-03-18 DIAGNOSIS — N390 Urinary tract infection, site not specified: Secondary | ICD-10-CM

## 2024-04-25 NOTE — Patient Instructions (Incomplete)

## 2024-04-27 ENCOUNTER — Ambulatory Visit: Admitting: Nurse Practitioner

## 2024-04-27 DIAGNOSIS — E1129 Type 2 diabetes mellitus with other diabetic kidney complication: Secondary | ICD-10-CM

## 2024-04-27 DIAGNOSIS — E119 Type 2 diabetes mellitus without complications: Secondary | ICD-10-CM

## 2024-04-27 DIAGNOSIS — E1159 Type 2 diabetes mellitus with other circulatory complications: Secondary | ICD-10-CM

## 2024-04-27 DIAGNOSIS — I429 Cardiomyopathy, unspecified: Secondary | ICD-10-CM

## 2024-04-27 DIAGNOSIS — Z23 Encounter for immunization: Secondary | ICD-10-CM

## 2024-04-27 DIAGNOSIS — E1142 Type 2 diabetes mellitus with diabetic polyneuropathy: Secondary | ICD-10-CM

## 2024-04-27 DIAGNOSIS — I5032 Chronic diastolic (congestive) heart failure: Secondary | ICD-10-CM

## 2024-04-27 DIAGNOSIS — E1169 Type 2 diabetes mellitus with other specified complication: Secondary | ICD-10-CM

## 2024-04-27 DIAGNOSIS — G910 Communicating hydrocephalus: Secondary | ICD-10-CM

## 2024-04-27 DIAGNOSIS — D61818 Other pancytopenia: Secondary | ICD-10-CM

## 2024-04-27 DIAGNOSIS — F321 Major depressive disorder, single episode, moderate: Secondary | ICD-10-CM

## 2024-07-17 NOTE — Patient Instructions (Incomplete)

## 2024-07-23 ENCOUNTER — Ambulatory Visit: Admitting: Nurse Practitioner

## 2024-07-23 DIAGNOSIS — I5032 Chronic diastolic (congestive) heart failure: Secondary | ICD-10-CM

## 2024-07-23 DIAGNOSIS — E66812 Obesity, class 2: Secondary | ICD-10-CM

## 2024-07-23 DIAGNOSIS — E1169 Type 2 diabetes mellitus with other specified complication: Secondary | ICD-10-CM

## 2024-07-23 DIAGNOSIS — E1142 Type 2 diabetes mellitus with diabetic polyneuropathy: Secondary | ICD-10-CM

## 2024-07-23 DIAGNOSIS — E538 Deficiency of other specified B group vitamins: Secondary | ICD-10-CM

## 2024-07-23 DIAGNOSIS — E1129 Type 2 diabetes mellitus with other diabetic kidney complication: Secondary | ICD-10-CM

## 2024-07-23 DIAGNOSIS — G910 Communicating hydrocephalus: Secondary | ICD-10-CM

## 2024-07-23 DIAGNOSIS — F324 Major depressive disorder, single episode, in partial remission: Secondary | ICD-10-CM

## 2024-07-23 DIAGNOSIS — Z23 Encounter for immunization: Secondary | ICD-10-CM

## 2024-07-23 DIAGNOSIS — I152 Hypertension secondary to endocrine disorders: Secondary | ICD-10-CM

## 2024-07-23 DIAGNOSIS — E119 Type 2 diabetes mellitus without complications: Secondary | ICD-10-CM

## 2024-07-23 DIAGNOSIS — I429 Cardiomyopathy, unspecified: Secondary | ICD-10-CM

## 2024-08-09 ENCOUNTER — Inpatient Hospital Stay
Admission: EM | Admit: 2024-08-09 | Discharge: 2024-08-13 | Disposition: A | Source: Home / Self Care | Attending: Hospitalist | Admitting: Hospitalist

## 2024-08-09 ENCOUNTER — Other Ambulatory Visit: Payer: Self-pay

## 2024-08-09 DIAGNOSIS — I5032 Chronic diastolic (congestive) heart failure: Secondary | ICD-10-CM | POA: Diagnosis present

## 2024-08-09 DIAGNOSIS — R310 Gross hematuria: Secondary | ICD-10-CM

## 2024-08-09 DIAGNOSIS — E1110 Type 2 diabetes mellitus with ketoacidosis without coma: Secondary | ICD-10-CM | POA: Diagnosis present

## 2024-08-09 DIAGNOSIS — E1142 Type 2 diabetes mellitus with diabetic polyneuropathy: Secondary | ICD-10-CM

## 2024-08-09 DIAGNOSIS — J189 Pneumonia, unspecified organism: Secondary | ICD-10-CM

## 2024-08-09 DIAGNOSIS — R739 Hyperglycemia, unspecified: Principal | ICD-10-CM

## 2024-08-09 DIAGNOSIS — E875 Hyperkalemia: Secondary | ICD-10-CM

## 2024-08-09 DIAGNOSIS — Z8673 Personal history of transient ischemic attack (TIA), and cerebral infarction without residual deficits: Secondary | ICD-10-CM

## 2024-08-09 DIAGNOSIS — R569 Unspecified convulsions: Secondary | ICD-10-CM

## 2024-08-09 DIAGNOSIS — N400 Enlarged prostate without lower urinary tract symptoms: Secondary | ICD-10-CM | POA: Diagnosis present

## 2024-08-09 DIAGNOSIS — N3941 Urge incontinence: Secondary | ICD-10-CM | POA: Diagnosis present

## 2024-08-09 DIAGNOSIS — N179 Acute kidney failure, unspecified: Secondary | ICD-10-CM

## 2024-08-09 DIAGNOSIS — G910 Communicating hydrocephalus: Secondary | ICD-10-CM | POA: Diagnosis present

## 2024-08-09 LAB — CBG MONITORING, ED: Glucose-Capillary: >600 mg/dL (ref 70–99)

## 2024-08-09 MED ORDER — SODIUM CHLORIDE 0.9 % IV BOLUS (SEPSIS)
1000.0000 mL | Freq: Once | INTRAVENOUS | Status: AC
  Administered 2024-08-10: 1000 mL via INTRAVENOUS

## 2024-08-09 NOTE — ED Triage Notes (Signed)
 Pt arrives via [facility] from [facility] due to hematuria x today per nursing facility. EMS reports past CVA w/ speech deficit; pt is hyperglycemic w/ BG 390. PMHx: CVA, T2DM, HTN, HLD

## 2024-08-10 ENCOUNTER — Emergency Department

## 2024-08-10 DIAGNOSIS — R310 Gross hematuria: Secondary | ICD-10-CM

## 2024-08-10 DIAGNOSIS — E1110 Type 2 diabetes mellitus with ketoacidosis without coma: Secondary | ICD-10-CM | POA: Diagnosis present

## 2024-08-10 DIAGNOSIS — N179 Acute kidney failure, unspecified: Secondary | ICD-10-CM

## 2024-08-10 DIAGNOSIS — J189 Pneumonia, unspecified organism: Secondary | ICD-10-CM

## 2024-08-10 DIAGNOSIS — E875 Hyperkalemia: Secondary | ICD-10-CM

## 2024-08-10 DIAGNOSIS — R569 Unspecified convulsions: Secondary | ICD-10-CM

## 2024-08-10 LAB — BASIC METABOLIC PANEL WITH GFR
Anion gap: 18 — ABNORMAL HIGH (ref 5–15)
Anion gap: 14 (ref 5–15)
Anion gap: 10 (ref 5–15)
BUN: 12 mg/dL (ref 6–20)
BUN: 23 mg/dL — ABNORMAL HIGH (ref 6–20)
BUN: 28 mg/dL — ABNORMAL HIGH (ref 6–20)
CO2: 10 mmol/L — ABNORMAL LOW (ref 22–32)
CO2: 21 mmol/L — ABNORMAL LOW (ref 22–32)
CO2: 29 mmol/L (ref 22–32)
Calcium: 5.5 mg/dL — CL (ref 8.9–10.3)
Calcium: 7.4 mg/dL — ABNORMAL LOW (ref 8.9–10.3)
Calcium: 8.9 mg/dL (ref 8.9–10.3)
Chloride: 109 mmol/L (ref 98–111)
Chloride: 106 mmol/L (ref 98–111)
Chloride: 110 mmol/L (ref 98–111)
Creatinine, Ser: 0.32 mg/dL — ABNORMAL LOW (ref 0.61–1.24)
Creatinine, Ser: 0.89 mg/dL (ref 0.61–1.24)
Creatinine, Ser: 1.19 mg/dL (ref 0.61–1.24)
GFR, Estimated: >60 mL/min
GFR, Estimated: >60 mL/min
GFR, Estimated: >60 mL/min
Glucose, Bld: 167 mg/dL — ABNORMAL HIGH (ref 70–99)
Glucose, Bld: 236 mg/dL — ABNORMAL HIGH (ref 70–99)
Glucose, Bld: 129 mg/dL — ABNORMAL HIGH (ref 70–99)
Potassium: 3.5 mmol/L (ref 3.5–5.1)
Potassium: 3.6 mmol/L (ref 3.5–5.1)
Potassium: 3.9 mmol/L (ref 3.5–5.1)
Sodium: 138 mmol/L (ref 135–145)
Sodium: 141 mmol/L (ref 135–145)
Sodium: 149 mmol/L — ABNORMAL HIGH (ref 135–145)

## 2024-08-10 LAB — COMPREHENSIVE METABOLIC PANEL WITH GFR
ALT: 23 U/L (ref 0–44)
AST: 12 U/L — ABNORMAL LOW (ref 15–41)
Albumin: 4.1 g/dL (ref 3.5–5.0)
Alkaline Phosphatase: 152 U/L — ABNORMAL HIGH (ref 38–126)
Anion gap: 16 — ABNORMAL HIGH (ref 5–15)
BUN: 36 mg/dL — ABNORMAL HIGH (ref 6–20)
CO2: 26 mmol/L (ref 22–32)
Calcium: 9.8 mg/dL (ref 8.9–10.3)
Chloride: 93 mmol/L — ABNORMAL LOW (ref 98–111)
Creatinine, Ser: 1.74 mg/dL — ABNORMAL HIGH (ref 0.61–1.24)
GFR, Estimated: 47 mL/min — ABNORMAL LOW
Glucose, Bld: 791 mg/dL (ref 70–99)
Potassium: 5.9 mmol/L — ABNORMAL HIGH (ref 3.5–5.1)
Sodium: 134 mmol/L — ABNORMAL LOW (ref 135–145)
Total Bilirubin: 0.8 mg/dL (ref 0.0–1.2)
Total Protein: 8.1 g/dL (ref 6.5–8.1)

## 2024-08-10 LAB — CBC WITH DIFFERENTIAL/PLATELET
Abs Immature Granulocytes: 0.02 10*3/uL (ref 0.00–0.07)
Basophils Absolute: 0 10*3/uL (ref 0.0–0.1)
Basophils Relative: 0 %
Eosinophils Absolute: 0.2 10*3/uL (ref 0.0–0.5)
Eosinophils Relative: 3 %
HCT: 45.9 % (ref 39.0–52.0)
Hemoglobin: 14.5 g/dL (ref 13.0–17.0)
Immature Granulocytes: 0 %
Lymphocytes Relative: 12 %
Lymphs Abs: 0.8 10*3/uL (ref 0.7–4.0)
MCH: 28 pg (ref 26.0–34.0)
MCHC: 31.6 g/dL (ref 30.0–36.0)
MCV: 88.8 fL (ref 80.0–100.0)
Monocytes Absolute: 0.7 10*3/uL (ref 0.1–1.0)
Monocytes Relative: 10 %
Neutro Abs: 5 10*3/uL (ref 1.7–7.7)
Neutrophils Relative %: 75 %
Platelets: 171 10*3/uL (ref 150–400)
RBC: 5.17 MIL/uL (ref 4.22–5.81)
RDW: 12.8 % (ref 11.5–15.5)
WBC: 6.7 10*3/uL (ref 4.0–10.5)
nRBC: 0 % (ref 0.0–0.2)

## 2024-08-10 LAB — MRSA NEXT GEN BY PCR, NASAL: MRSA by PCR Next Gen: NOT DETECTED

## 2024-08-10 LAB — BLOOD GAS, VENOUS
Acid-Base Excess: 3.6 mmol/L — ABNORMAL HIGH (ref 0.0–2.0)
Bicarbonate: 31.1 mmol/L — ABNORMAL HIGH (ref 20.0–28.0)
O2 Saturation: 52.1 %
Patient temperature: 37
pCO2, Ven: 59 mmHg (ref 44–60)
pH, Ven: 7.33 (ref 7.25–7.43)
pO2, Ven: 32 mmHg (ref 32–45)

## 2024-08-10 LAB — CBG MONITORING, ED
Glucose-Capillary: >600 mg/dL (ref 70–99)
Glucose-Capillary: 540 mg/dL (ref 70–99)
Glucose-Capillary: 431 mg/dL — ABNORMAL HIGH (ref 70–99)
Glucose-Capillary: 404 mg/dL — ABNORMAL HIGH (ref 70–99)
Glucose-Capillary: 353 mg/dL — ABNORMAL HIGH (ref 70–99)
Glucose-Capillary: 289 mg/dL — ABNORMAL HIGH (ref 70–99)

## 2024-08-10 LAB — GLUCOSE, CAPILLARY
Glucose-Capillary: 230 mg/dL — ABNORMAL HIGH (ref 70–99)
Glucose-Capillary: 231 mg/dL — ABNORMAL HIGH (ref 70–99)
Glucose-Capillary: 158 mg/dL — ABNORMAL HIGH (ref 70–99)
Glucose-Capillary: 183 mg/dL — ABNORMAL HIGH (ref 70–99)
Glucose-Capillary: 127 mg/dL — ABNORMAL HIGH (ref 70–99)
Glucose-Capillary: 130 mg/dL — ABNORMAL HIGH (ref 70–99)
Glucose-Capillary: 138 mg/dL — ABNORMAL HIGH (ref 70–99)
Glucose-Capillary: 122 mg/dL — ABNORMAL HIGH (ref 70–99)
Glucose-Capillary: 122 mg/dL — ABNORMAL HIGH (ref 70–99)
Glucose-Capillary: 122 mg/dL — ABNORMAL HIGH (ref 70–99)
Glucose-Capillary: 150 mg/dL — ABNORMAL HIGH (ref 70–99)
Glucose-Capillary: 141 mg/dL — ABNORMAL HIGH (ref 70–99)
Glucose-Capillary: 169 mg/dL — ABNORMAL HIGH (ref 70–99)

## 2024-08-10 LAB — URINALYSIS, W/ REFLEX TO CULTURE (INFECTION SUSPECTED)
Bacteria, UA: NONE SEEN
Bilirubin Urine: NEGATIVE
Glucose, UA: >=500 mg/dL — AB
Ketones, ur: 5 mg/dL — AB
Leukocytes,Ua: NEGATIVE
Nitrite: NEGATIVE
Protein, ur: NEGATIVE mg/dL
Specific Gravity, Urine: 1.035 — ABNORMAL HIGH (ref 1.005–1.030)
pH: 5 (ref 5.0–8.0)

## 2024-08-10 LAB — RESP PANEL BY RT-PCR (RSV, FLU A&B, COVID)  RVPGX2
Influenza A by PCR: NEGATIVE
Influenza B by PCR: NEGATIVE
Resp Syncytial Virus by PCR: NEGATIVE
SARS Coronavirus 2 by RT PCR: NEGATIVE

## 2024-08-10 LAB — TROPONIN T, HIGH SENSITIVITY
Troponin T High Sensitivity: 8 ng/L (ref 0–19)
Troponin T High Sensitivity: 7 ng/L (ref 0–19)

## 2024-08-10 LAB — SAMPLE TO BLOOD BANK

## 2024-08-10 LAB — LACTIC ACID, PLASMA: Lactic Acid, Venous: 2.2 mmol/L (ref 0.5–1.9)

## 2024-08-10 LAB — HEMOGLOBIN A1C
Hgb A1c MFr Bld: 16.8 % — ABNORMAL HIGH (ref 4.8–5.6)
Mean Plasma Glucose: 435.46 mg/dL

## 2024-08-10 LAB — PROTIME-INR
INR: 1.1 (ref 0.8–1.2)
Prothrombin Time: 15.2 s (ref 11.4–15.2)

## 2024-08-10 LAB — BETA-HYDROXYBUTYRIC ACID
Beta-Hydroxybutyric Acid: 0.08 mmol/L (ref 0.05–0.27)
Beta-Hydroxybutyric Acid: 1.47 mmol/L — ABNORMAL HIGH (ref 0.05–0.27)

## 2024-08-10 LAB — LIPASE, BLOOD: Lipase: 58 U/L — ABNORMAL HIGH (ref 11–51)

## 2024-08-10 LAB — PROCALCITONIN: Procalcitonin: 0.2 ng/mL

## 2024-08-10 MED ORDER — SODIUM CHLORIDE 0.9 % IV SOLN
2.0000 g | Freq: Once | INTRAVENOUS | Status: AC
  Administered 2024-08-10: 2 g via INTRAVENOUS
  Filled 2024-08-10: qty 12.5

## 2024-08-10 MED ORDER — ENOXAPARIN SODIUM 60 MG/0.6ML IJ SOSY
60.0000 mg | PREFILLED_SYRINGE | INTRAMUSCULAR | Status: DC
  Administered 2024-08-10 – 2024-08-12 (×3): 60 mg via SUBCUTANEOUS
  Filled 2024-08-10 (×3): qty 0.6

## 2024-08-10 MED ORDER — ASPIRIN 81 MG PO TBEC
81.0000 mg | DELAYED_RELEASE_TABLET | Freq: Every day | ORAL | Status: DC
  Administered 2024-08-11 – 2024-08-13 (×3): 81 mg via ORAL
  Filled 2024-08-10 (×3): qty 1

## 2024-08-10 MED ORDER — CALCIUM GLUCONATE-NACL 1-0.675 GM/50ML-% IV SOLN
1.0000 g | Freq: Once | INTRAVENOUS | Status: AC
  Administered 2024-08-10: 1000 mg via INTRAVENOUS
  Filled 2024-08-10: qty 50

## 2024-08-10 MED ORDER — POTASSIUM CHLORIDE 10 MEQ/100ML IV SOLN
10.0000 meq | INTRAVENOUS | Status: AC
  Administered 2024-08-10 (×4): 10 meq via INTRAVENOUS
  Filled 2024-08-10 (×4): qty 100

## 2024-08-10 MED ORDER — VITAMIN B-12 1000 MCG PO TABS
1000.0000 ug | ORAL_TABLET | Freq: Every day | ORAL | Status: DC
  Administered 2024-08-11 – 2024-08-13 (×3): 1000 ug via ORAL
  Filled 2024-08-10 (×3): qty 1

## 2024-08-10 MED ORDER — POTASSIUM CHLORIDE CRYS ER 20 MEQ PO TBCR: 40.0000 meq | EXTENDED_RELEASE_TABLET | Freq: Once | ORAL | Status: DC

## 2024-08-10 MED ORDER — DONEPEZIL HCL 5 MG PO TABS
5.0000 mg | ORAL_TABLET | Freq: Every evening | ORAL | Status: DC
  Administered 2024-08-10 – 2024-08-12 (×3): 5 mg via ORAL
  Filled 2024-08-10 (×3): qty 1

## 2024-08-10 MED ORDER — DEXTROSE IN LACTATED RINGERS 5 % IV SOLN: INTRAVENOUS | Status: AC

## 2024-08-10 MED ORDER — TAMSULOSIN HCL 0.4 MG PO CAPS
0.4000 mg | ORAL_CAPSULE | Freq: Every day | ORAL | Status: DC
  Administered 2024-08-10 – 2024-08-12 (×3): 0.4 mg via ORAL
  Filled 2024-08-10 (×3): qty 1

## 2024-08-10 MED ORDER — SODIUM CHLORIDE 0.9 % IV SOLN
100.0000 mg | Freq: Two times a day (BID) | INTRAVENOUS | Status: DC
  Administered 2024-08-10: 100 mg via INTRAVENOUS
  Filled 2024-08-10: qty 100

## 2024-08-10 MED ORDER — DEXTROSE 50 % IV SOLN: 0.0000 mL | INTRAVENOUS | Status: DC | PRN

## 2024-08-10 MED ORDER — IOHEXOL 300 MG/ML  SOLN
75.0000 mL | Freq: Once | INTRAMUSCULAR | Status: AC | PRN
  Administered 2024-08-10: 75 mL via INTRAVENOUS

## 2024-08-10 MED ORDER — LACTATED RINGERS IV BOLUS
20.0000 mL/kg | Freq: Once | INTRAVENOUS | Status: AC
  Administered 2024-08-10: 2458 mL via INTRAVENOUS

## 2024-08-10 MED ORDER — INSULIN GLARGINE-YFGN 100 UNIT/ML ~~LOC~~ SOLN
25.0000 [IU] | Freq: Every day | SUBCUTANEOUS | Status: DC
  Filled 2024-08-10: qty 0.25

## 2024-08-10 MED ORDER — INSULIN REGULAR(HUMAN) IN NACL 100-0.9 UT/100ML-% IV SOLN
INTRAVENOUS | Status: DC
  Administered 2024-08-10: 13 [IU]/h via INTRAVENOUS
  Administered 2024-08-10: 3 [IU]/h via INTRAVENOUS
  Filled 2024-08-10 (×2): qty 100

## 2024-08-10 MED ORDER — INSULIN ASPART 100 UNIT/ML IJ SOLN
0.0000 [IU] | Freq: Three times a day (TID) | INTRAMUSCULAR | Status: DC
  Administered 2024-08-11: 5 [IU] via SUBCUTANEOUS
  Administered 2024-08-11: 8 [IU] via SUBCUTANEOUS
  Administered 2024-08-11: 11 [IU] via SUBCUTANEOUS
  Administered 2024-08-12: 5 [IU] via SUBCUTANEOUS
  Administered 2024-08-12: 3 [IU] via SUBCUTANEOUS
  Administered 2024-08-12: 2 [IU] via SUBCUTANEOUS
  Administered 2024-08-13: 5 [IU] via SUBCUTANEOUS
  Administered 2024-08-13: 8 [IU] via SUBCUTANEOUS
  Filled 2024-08-10 (×2): qty 5
  Filled 2024-08-10: qty 3
  Filled 2024-08-10: qty 8
  Filled 2024-08-10: qty 5
  Filled 2024-08-10: qty 8
  Filled 2024-08-10: qty 2
  Filled 2024-08-10: qty 11

## 2024-08-10 MED ORDER — CARVEDILOL 6.25 MG PO TABS: 6.2500 mg | ORAL_TABLET | Freq: Two times a day (BID) | ORAL | Status: DC

## 2024-08-10 MED ORDER — INSULIN ASPART 100 UNIT/ML IJ SOLN
0.0000 [IU] | Freq: Every day | INTRAMUSCULAR | Status: DC
  Administered 2024-08-11 – 2024-08-12 (×2): 2 [IU] via SUBCUTANEOUS
  Filled 2024-08-10 (×2): qty 2

## 2024-08-10 MED ORDER — ORAL CARE MOUTH RINSE: 15.0000 mL | OROMUCOSAL | Status: DC | PRN

## 2024-08-10 MED ORDER — SODIUM ZIRCONIUM CYCLOSILICATE 10 G PO PACK
10.0000 g | PACK | Freq: Once | ORAL | Status: AC
  Administered 2024-08-10: 10 g via ORAL
  Filled 2024-08-10: qty 1

## 2024-08-10 MED ORDER — SODIUM CHLORIDE 0.9 % IV BOLUS (SEPSIS)
1000.0000 mL | Freq: Once | INTRAVENOUS | Status: AC
  Administered 2024-08-10: 1000 mL via INTRAVENOUS

## 2024-08-10 MED ORDER — CITALOPRAM HYDROBROMIDE 20 MG PO TABS
10.0000 mg | ORAL_TABLET | Freq: Every day | ORAL | Status: DC
  Administered 2024-08-11 – 2024-08-13 (×3): 10 mg via ORAL
  Filled 2024-08-10 (×3): qty 1

## 2024-08-10 MED ORDER — GABAPENTIN 300 MG PO CAPS
300.0000 mg | ORAL_CAPSULE | Freq: Three times a day (TID) | ORAL | Status: DC
  Administered 2024-08-10 – 2024-08-13 (×8): 300 mg via ORAL
  Filled 2024-08-10 (×8): qty 1

## 2024-08-10 MED ORDER — PANTOPRAZOLE SODIUM 40 MG PO TBEC
40.0000 mg | DELAYED_RELEASE_TABLET | Freq: Every day | ORAL | Status: DC
  Administered 2024-08-11 – 2024-08-13 (×3): 40 mg via ORAL
  Filled 2024-08-10 (×3): qty 1

## 2024-08-10 MED ORDER — SODIUM CHLORIDE 0.9 % IV SOLN
1.0000 g | INTRAVENOUS | Status: DC
  Administered 2024-08-10: 1 g via INTRAVENOUS
  Filled 2024-08-10: qty 10

## 2024-08-10 MED ORDER — ATORVASTATIN CALCIUM 80 MG PO TABS
80.0000 mg | ORAL_TABLET | Freq: Every day | ORAL | Status: DC
  Administered 2024-08-11 – 2024-08-13 (×3): 80 mg via ORAL
  Filled 2024-08-10: qty 1
  Filled 2024-08-10 (×2): qty 4

## 2024-08-10 MED ORDER — INSULIN GLARGINE-YFGN 100 UNIT/ML ~~LOC~~ SOLN
25.0000 [IU] | Freq: Every day | SUBCUTANEOUS | Status: DC
  Administered 2024-08-10 – 2024-08-11 (×2): 25 [IU] via SUBCUTANEOUS
  Filled 2024-08-10 (×2): qty 0.25

## 2024-08-10 MED ORDER — ALBUTEROL SULFATE (2.5 MG/3ML) 0.083% IN NEBU
10.0000 mg | INHALATION_SOLUTION | Freq: Once | RESPIRATORY_TRACT | Status: AC
  Administered 2024-08-10: 10 mg via RESPIRATORY_TRACT
  Filled 2024-08-10: qty 12

## 2024-08-10 MED ORDER — OXYBUTYNIN CHLORIDE ER 10 MG PO TB24
10.0000 mg | ORAL_TABLET | Freq: Every day | ORAL | Status: DC
  Administered 2024-08-10 – 2024-08-12 (×3): 10 mg via ORAL
  Filled 2024-08-10 (×4): qty 1

## 2024-08-10 MED ORDER — LACTATED RINGERS IV SOLN: INTRAVENOUS | Status: AC

## 2024-08-10 MED ORDER — LISINOPRIL 5 MG PO TABS: 5.0000 mg | ORAL_TABLET | Freq: Every day | ORAL | Status: DC

## 2024-08-10 NOTE — Assessment & Plan Note (Addendum)
 Lactic acidosis CT showed pneumonia and lactic acid returned 2.2 but procalcitonin 0.2 Received cefepime  in the ED Empiric treatment with Rocephin  and doxycycline  Aspiration precautions

## 2024-08-10 NOTE — Plan of Care (Signed)

## 2024-08-10 NOTE — Hospital Course (Signed)
 [name]

## 2024-08-10 NOTE — Assessment & Plan Note (Addendum)
 Creatinine 1.74, up from baseline of 1.15 about 8 months prior Expecting improvement with IV hydration Monitor renal function and avoid nephrotoxins

## 2024-08-10 NOTE — Progress Notes (Signed)
" °  PROGRESS NOTE    [name]  FMW:[id number] DOB: 08-13-1970 DOA: 08/09/2024 PCP: Valerio Melanie DASEN, NP  IC10A/IC10A-AA  LOS: 0 days   Brief hospital course:   Assessment & Plan: [name] is a 54 y.o. male with medical history significant for CVA, hydrocephalus, seizures, dementia, HTN, DM, diastolic CHF, BPH/urge incontinence sent from his facility at [facility] healthcare for evaluation of gross hematuria, found to be in DKA/HHS on workup.    * Diabetic ketoacidosis without coma associated with type 2 diabetes mellitus (HCC) --transition to glargine 25u. --ACHS and SSI --cont MIVF  Hyperkalemia Potassium 5.9 S/p calcium  gluconate and nebulized albuterol  in the ED --resolved after insulin  gtt  AKI (acute kidney injury) Creatinine 1.74, up from baseline of 1.15 about 8 months prior --cont MIVF  Gross hematuria BPH / chronic urinary incontinence Last urology visit 2023 (Foley versus PTNS discussed) --UA on presentation with small Hgb, Hgb 14.5 --cont Flomax  and oxybutynin  Hold aspirin  and Plavix   Pneumonia, ruled out --procal 0.2.  Initial CXR no mention of acute finding.  No sign of respiratory symptoms. --d/c abx  Lactic acidosis --likely due to DKA. --cont MIVF  Chronic communicating hydrocephalus (HCC) No acute issues  Dementia Continue Aricept  Delirium precautions  History of stroke Continue atorvastatin  -- hold aspirin  due to gross hematuria  Seizures (HCC) Continue Neurontin   Chronic diastolic CHF (congestive heart failure) (HCC) Clinically euvolemic   DVT prophylaxis: Lovenox  SQ Code Status: Full code  Family Communication:  Level of care: Med-Surg Dispo:   The patient is from: [facility]  Anticipated d/c is to: [facility]  Anticipated d/c date is: 2-3 days   Subjective and Interval History:  Pt became more alert as the day went on.  Transitioned off insulin  gtt around evening time.   Objective: Vitals:   08/10/24  1400 08/10/24 1452 08/10/24 1500 08/10/24 1600  BP:    99/73  Pulse: 64  64 65  Resp: 10  10 11   Temp:  97.6 F (36.4 C) 97.6 F (36.4 C)   TempSrc:  Oral Oral   SpO2: 94%  98% 97%  Weight:      Height:        Intake/Output Summary (Last 24 hours) at 08/10/2024 1750 Last data filed at 08/10/2024 1600 Gross per 24 hour  Intake 8246.74 ml  Output 700 ml  Net 7546.74 ml   Filed Weights   08/09/24 2300 08/10/24 0823  Weight: 122.9 kg 115.8 kg    Examination:   Constitutional: NAD, awake, able to answer questions HEENT: conjunctivae and lids normal, EOMI CV: No cyanosis.   RESP: normal respiratory effort, on RA   Data Reviewed: I have personally reviewed labs and imaging studies  No charge note.   Ellouise Haber, MD [facility] If 7PM-7AM, please contact night-coverage 08/10/2024, 5:50 PM   "

## 2024-08-10 NOTE — ED Notes (Signed)
 Urinal left bedside and pt instructed to leave a sample. Pt asked if continent to which pt reported yes.

## 2024-08-10 NOTE — Assessment & Plan Note (Addendum)
 Continue atorvastatin  Will hold aspirin  due to gross hematuria

## 2024-08-10 NOTE — Inpatient Diabetes Management (Signed)
 Inpatient Diabetes Program Recommendations  AACE/ADA: New Consensus Statement on Inpatient Glycemic Control   Target Ranges:  Prepandial:   less than 140 mg/dL      Peak postprandial:   less than 180 mg/dL (1-2 hours)      Critically ill patients:  140 - 180 mg/dL    Latest Reference Range & Units 08/10/24 02:36 08/10/24 03:45 08/10/24 04:46 08/10/24 05:45 08/10/24 06:42  Glucose-Capillary 70 - 99 mg/dL >399 (HH) 459 (HH) 568 (H) 404 (H) 353 (H)    Latest Reference Range & Units 08/10/24 00:01  CO2 22 - 32 mmol/L 26  Glucose 70 - 99 mg/dL 208 (HH)  BUN 6 - 20 mg/dL 36 (H)  Creatinine 9.38 - 1.24 mg/dL 8.25 (H)  Calcium  8.9 - 10.3 mg/dL 9.8  Anion gap 5 - 15  16 (H)    Latest Reference Range & Units 08/10/24 00:01  Beta-Hydroxybutyric Acid 0.05 - 0.27 mmol/L 1.47 (H)    Latest Reference Range & Units 01/20/24 08:38 08/10/24 05:02  Hemoglobin A1C 4.8 - 5.6 %  16.8 (H)  HB A1C (BAYER DCA - WAIVED) 4.8 - 5.6 % 6.3 (H)    Review of Glycemic Control  Diabetes history: DM2 Outpatient Diabetes medications: Metformin  XR 500 mg BID Current orders for Inpatient glycemic control: IV insulin   Inpatient Diabetes Program Recommendations:    Insulin : Once provider is ready to transition from IV to SQ insulin , please consider ordering insulin  glargine 25 units Q24H, CBGs Q4H, and Novolog  0-15 units Q4H.   HbgA1C: A1C 16.8% on 08/10/24 indicating an average glucose of 435 mg/dl over the past 2-3 months. Prior A1C 6.3% on 01/20/24.   NOTE: Patient admitted from [facility] with DKA, AKI, hematuria, pneumonia, lactic acidosis, and patient has hx of prior CVA, dementia, seizures. Initial lab glucose 791 mg/dl and started on IV insulin  for DKA.   Called [facility] to inquire about DM medications. Staff reported they give patient his medications. Informed that patient is only prescribed Metformin  XR 500 mg BID for DM. Inquired about any recent steroids and was told that patient has not had any  steroids prescribed recently.   Thanks, Earnie Gainer, RN, MSN, CDCES Diabetes Coordinator Inpatient Diabetes Program [phone] (Team Pager from 8am to 5pm)

## 2024-08-10 NOTE — Progress Notes (Signed)
 PHARMACIST - PHYSICIAN COMMUNICATION  CONCERNING:  Enoxaparin  (Lovenox ) for DVT Prophylaxis    RECOMMENDATION: Patient was prescribed enoxaprin 40mg  q24 hours for VTE prophylaxis.   Filed Weights   08/09/24 2300 08/10/24 0823  Weight: 122.9 kg (271 lb) 115.8 kg (255 lb 4.7 oz)    Body mass index is 33.68 kg/m.  Estimated Creatinine Clearance: 98 mL/min (by C-G formula based on SCr of 1.19 mg/dL).   Based on [facility] policy patient is candidate for enoxaparin  0.5mg /kg TBW SQ every 24 hours based on BMI being >30.   DESCRIPTION: Pharmacy has adjusted enoxaparin  dose per [facility] policy.  Patient is now receiving enoxaparin  60 mg every 24 hours    Annabella LOISE Banks, PharmD Clinical Pharmacist  08/10/2024 6:01 PM

## 2024-08-10 NOTE — ED Notes (Signed)
 Pt was cleaned up and brief was changed by this tech and the nurse, brandon. Pt was able to stand with a walker with a steadying assist. Bed linen was changed and a chux was put under the pt. Pt clothes were wet from incontinence, therefore but in a belongings bag and pt put on a gown.

## 2024-08-10 NOTE — Assessment & Plan Note (Signed)
 Potassium 5.9 S/p calcium  gluconate and nebulized albuterol  in the ED Serial BMP and correct as needed

## 2024-08-10 NOTE — ED Notes (Signed)
 In and out catheter performed at this time per the verbal order of Ward, Comer, yielding 500 cc of yellow/amber urine.

## 2024-08-10 NOTE — Assessment & Plan Note (Signed)
 Clinically euvolemic Continue carvedilol , lisinopril .  Will hold amlodipine 

## 2024-08-10 NOTE — Assessment & Plan Note (Signed)
 Continue Neurontin

## 2024-08-11 LAB — CBC
HCT: 39.3 % (ref 39.0–52.0)
Hemoglobin: 12.4 g/dL — ABNORMAL LOW (ref 13.0–17.0)
MCH: 27.7 pg (ref 26.0–34.0)
MCHC: 31.6 g/dL (ref 30.0–36.0)
MCV: 87.9 fL (ref 80.0–100.0)
Platelets: 182 10*3/uL (ref 150–400)
RBC: 4.47 MIL/uL (ref 4.22–5.81)
RDW: 12.9 % (ref 11.5–15.5)
WBC: 4.3 10*3/uL (ref 4.0–10.5)
nRBC: 0 % (ref 0.0–0.2)

## 2024-08-11 LAB — GLUCOSE, CAPILLARY
Glucose-Capillary: 338 mg/dL — ABNORMAL HIGH (ref 70–99)
Glucose-Capillary: 265 mg/dL — ABNORMAL HIGH (ref 70–99)
Glucose-Capillary: 205 mg/dL — ABNORMAL HIGH (ref 70–99)
Glucose-Capillary: 237 mg/dL — ABNORMAL HIGH (ref 70–99)

## 2024-08-11 LAB — BASIC METABOLIC PANEL WITH GFR
Anion gap: 10 (ref 5–15)
BUN: 15 mg/dL (ref 6–20)
CO2: 30 mmol/L (ref 22–32)
Calcium: 9 mg/dL (ref 8.9–10.3)
Chloride: 102 mmol/L (ref 98–111)
Creatinine, Ser: 1.19 mg/dL (ref 0.61–1.24)
GFR, Estimated: >60 mL/min
Glucose, Bld: 373 mg/dL — ABNORMAL HIGH (ref 70–99)
Potassium: 4.8 mmol/L (ref 3.5–5.1)
Sodium: 141 mmol/L (ref 135–145)

## 2024-08-11 LAB — MAGNESIUM: Magnesium: 2 mg/dL (ref 1.7–2.4)

## 2024-08-11 MED ORDER — INSULIN STARTER KIT- PEN NEEDLES (ENGLISH)
1.0000 | Freq: Once | Status: AC
  Administered 2024-08-11: 1
  Filled 2024-08-11: qty 1

## 2024-08-11 MED ORDER — GLUCERNA SHAKE PO LIQD
237.0000 mL | Freq: Three times a day (TID) | ORAL | Status: DC
  Administered 2024-08-11 – 2024-08-13 (×7): 237 mL via ORAL

## 2024-08-11 MED ORDER — LIVING WELL WITH DIABETES BOOK
Freq: Once | Status: AC
  Filled 2024-08-11: qty 1

## 2024-08-11 MED ORDER — INSULIN GLARGINE-YFGN 100 UNIT/ML ~~LOC~~ SOLN
10.0000 [IU] | Freq: Once | SUBCUTANEOUS | Status: AC
  Administered 2024-08-11: 10 [IU] via SUBCUTANEOUS
  Filled 2024-08-11: qty 0.1

## 2024-08-11 MED ORDER — LACTATED RINGERS IV SOLN: INTRAVENOUS | Status: AC

## 2024-08-11 MED ORDER — INSULIN GLARGINE-YFGN 100 UNIT/ML ~~LOC~~ SOLN
35.0000 [IU] | Freq: Every day | SUBCUTANEOUS | Status: DC
  Administered 2024-08-12: 35 [IU] via SUBCUTANEOUS
  Filled 2024-08-11 (×2): qty 0.35

## 2024-08-11 NOTE — Inpatient Diabetes Management (Addendum)
 Inpatient Diabetes Program Recommendations  AACE/ADA: New Consensus Statement on Inpatient Glycemic Control  Target Ranges:  Prepandial:   less than 140 mg/dL      Peak postprandial:   less than 180 mg/dL (1-2 hours)      Critically ill patients:  140 - 180 mg/dL    Latest Reference Range & Units 08/10/24 12:28 08/10/24 13:51 08/10/24 14:45 08/10/24 15:53 08/10/24 16:03 08/10/24 16:59 08/10/24 18:09 08/10/24 18:48 08/10/24 21:08  Glucose-Capillary 70 - 99 mg/dL 872 (H) 869 (H) 861 (H) 122 (H) 122 (H) 122 (H) 150 (H) 141 (H) 169 (H)    Latest Reference Range & Units 01/20/24 08:38 08/10/24 05:02  Hemoglobin A1C 4.8 - 5.6 %  16.8 (H)  HB A1C (BAYER DCA - WAIVED) 4.8 - 5.6 % 6.3 (H)    Review of Glycemic Control  Diabetes history: DM2 Outpatient Diabetes medications: Metformin  XR 500 mg BID Current orders for Inpatient glycemic control: Semglee  25 units daily, Novolog  0-15 units TID with meals, Novolog  0-5 units QHS   Inpatient Diabetes Program Recommendations:     Insulin : Patient transitioned from IV to SQ insulin  on 08/10/24; patient received Semglee  25 units at 17:15.      HbgA1C: A1C 16.8% on 08/10/24 indicating an average glucose of 435 mg/dl over the past 2-3 months. Prior A1C 6.3% on 01/20/24.    NOTE: Patient admitted from [facility] with DKA, AKI, hematuria, pneumonia, lactic acidosis, and patient has hx of prior CVA, dementia, seizures. Initial lab glucose 791 mg/dl which was treated with IV insulin . Patient was transitioned to SQ insulin  on 08/10/24.    Addendum 08/11/24@10 :30-Spoke with patient at bedside regarding DM and insulin . Patient alert sitting up in chair and oriented to self and place. During conversation patient answered questions but slow to respond at times and had a hard time with thinking of words at times.  Patient confirms he has DM but not sure what medication he takes. Patient reports that the staff at [facility] check his glucose but he is not sure what his  sugars have been. Patient reports he does not follow any type of diet and drinks a lot of regular sodas (mainly SunKist). Discussed carbohydrate modified diet, encouraged patient to stop drinking regular sodas, and encouraged patient to limit sweets (cakes/cookies).  Discussed glucose and A1C goals. Explained what an A1C is and that his current A1C is 16.8% so his average glucose has been 435 mg/dl over the past 2-3 months.  Explained that he will likely need to take insulin  as an outpatient. Patient states that the staff at [facility] will be able to give him insulin  if needed. Patient reports that he goes to the doctor (facility staff take him to appointments) every 3-6 months; he reports that staff don't usually go back with him to see the doctor. Asked that staff at facility go back with him so they can update his doctor on DM medications and glucose trends. Informed patient that a Living Well with DM book was ordered; asked patient to read book and/or have staff read it to him if needed so he can learn more about DM. Informed patient that I will call [facility] to be sure that they will be able to administer insulin  to him if prescribed at discharge. Patient verbalized understanding of information discussed but would benefit from continued review of DM basics to increase his understanding of information.  [facility] and told that staff at the facility will administer the insulin  to  patient as long as they send an order when he is discharged from the hospital.    Thanks, Earnie Gainer, RN, MSN, CDCES Diabetes Coordinator Inpatient Diabetes Program [phone] (Team Pager from 8am to 5pm)

## 2024-08-11 NOTE — Progress Notes (Signed)
" °  PROGRESS NOTE    [name]  FMW:[id number] DOB: July 14, 1970 DOA: 08/09/2024 PCP: Valerio Melanie DASEN, NP  [id number]/[id number]-AA  LOS: 1 day   Brief hospital course:   Assessment & Plan: [name] is a 54 y.o. male with medical history significant for CVA, hydrocephalus, seizures, dementia, HTN, DM, diastolic CHF, BPH/urge incontinence sent from his facility at [facility] healthcare for evaluation of gross hematuria, found to be in DKA/HHS on workup.    * Diabetic ketoacidosis without coma associated with type 2 diabetes mellitus (HCC) --increase glargine to 35u daily --ACHS and SSI --cont MIVF  Hyperkalemia Potassium 5.9 S/p calcium  gluconate and nebulized albuterol  in the ED --resolved after insulin  gtt  AKI (acute kidney injury) Creatinine 1.74, up from baseline of 1.15 about 8 months prior --cont MIVF  Gross hematuria BPH / chronic urinary incontinence Last urology visit 2023 (Foley versus PTNS discussed) --UA on presentation with small Hgb, Hgb 14.5 --cont Flomax  and oxybutynin   Pneumonia, ruled out --procal 0.2.  Initial CXR no mention of acute finding.  No sign of respiratory symptoms. --d/c'ed abx  Lactic acidosis --likely due to DKA. --cont MIVF --trend  Chronic communicating hydrocephalus (HCC) No acute issues  Dementia Continue Aricept  Delirium precautions  History of stroke Continue atorvastatin  --cont ASA  Seizures (HCC) Continue Neurontin   Chronic diastolic CHF (congestive heart failure) (HCC) Clinically euvolemic   DVT prophylaxis: Lovenox  SQ Code Status: Full code  Family Communication:  Level of care: Med-Surg Dispo:   The patient is from: [facility]  Anticipated d/c is to: [facility]  Anticipated d/c date is:1-2 days   Subjective and Interval History:  Pt had no complaint and no request.     Objective: Vitals:   08/11/24 0425 08/11/24 0800 08/11/24 1306 08/11/24 1659  BP: 131/81 127/67 122/83 120/85  Pulse: 81 73  68 77  Resp: 13 13 17 16   Temp: 98 F (36.7 C) 98.2 F (36.8 C) 98.4 F (36.9 C) 98.3 F (36.8 C)  TempSrc: Oral Oral  Oral  SpO2: 100% 97% 96% 99%  Weight:      Height:        Intake/Output Summary (Last 24 hours) at 08/11/2024 1728 Last data filed at 08/11/2024 1200 Gross per 24 hour  Intake 3216.08 ml  Output 1400 ml  Net 1816.08 ml   Filed Weights   08/09/24 2300 08/10/24 0823  Weight: 122.9 kg 115.8 kg    Examination:   Constitutional: NAD, alert HEENT: conjunctivae and lids normal, EOMI CV: No cyanosis.   RESP: normal respiratory effort, on RA Neuro: II - XII grossly intact.     Data Reviewed: I have personally reviewed labs and imaging studies   Ellouise Haber, MD [facility] If 7PM-7AM, please contact night-coverage 08/11/2024, 5:28 PM   "

## 2024-08-11 NOTE — TOC Progression Note (Signed)
 Transition of Care [facility]) - Progression Note    Patient Details  Name: [name] MRN: [id number] Date of Birth: Dec 01, 1970  Transition of Care [facility]) CM/SW Contact  K'La JINNY Ruts, LCSW Phone Number: 08/11/2024, 1:31 PM  Clinical Narrative:    Chart reviewed. Patient is not fully oriented. The patient is a resident of [address]. Please follow up with family to complete brief note.                      Expected Discharge Plan and Services                                               Social Drivers of Health (SDOH) Interventions SDOH Screenings   Food Insecurity: No Food Insecurity (08/11/2024)  Housing: Low Risk (08/11/2024)  Transportation Needs: No Transportation Needs (08/11/2024)  Utilities: Not At Risk (08/11/2024)  Alcohol Screen: Low Risk (01/22/2024)  Depression (PHQ2-9): Low Risk (01/22/2024)  Financial Resource Strain: Low Risk (01/22/2024)  Physical Activity: Insufficiently Active (01/22/2024)  Social Connections: Socially Isolated (01/22/2024)  Stress: No Stress Concern Present (01/22/2024)  Tobacco Use: Medium Risk (02/03/2024)  Health Literacy: Inadequate Health Literacy (01/22/2024)    Readmission Risk Interventions     No data to display

## 2024-08-11 NOTE — Plan of Care (Signed)
" °  Problem: Education: Goal: Knowledge of General Education information will improve Description: Including pain rating scale, medication(s)/side effects and non-pharmacologic comfort measures Outcome: Progressing   Problem: Activity: Goal: Risk for activity intolerance will decrease Outcome: Progressing   Problem: Nutrition: Goal: Adequate nutrition will be maintained Outcome: Progressing   Problem: Coping: Goal: Level of anxiety will decrease Outcome: Progressing   Problem: Elimination: Goal: Will not experience complications related to urinary retention Outcome: Progressing   Problem: Pain Managment: Goal: General experience of comfort will improve and/or be controlled Outcome: Progressing   Problem: Safety: Goal: Ability to remain free from injury will improve Outcome: Progressing   Problem: Skin Integrity: Goal: Risk for impaired skin integrity will decrease Outcome: Progressing   Problem: Elimination: Goal: Will not experience complications related to bowel motility Outcome: Not Progressing   "

## 2024-08-12 LAB — GLUCOSE, CAPILLARY
Glucose-Capillary: 135 mg/dL — ABNORMAL HIGH (ref 70–99)
Glucose-Capillary: 192 mg/dL — ABNORMAL HIGH (ref 70–99)
Glucose-Capillary: 233 mg/dL — ABNORMAL HIGH (ref 70–99)
Glucose-Capillary: 240 mg/dL — ABNORMAL HIGH (ref 70–99)

## 2024-08-12 LAB — LACTIC ACID, PLASMA: Lactic Acid, Venous: 1.1 mmol/L (ref 0.5–1.9)

## 2024-08-12 NOTE — Progress Notes (Signed)
 Mobility Specialist Progress Note:    08/12/24 1200  Mobility  Activity Ambulated with assistance;Pivoted/transferred from bed to chair  Level of Assistance Moderate assist, patient does 50-74% (+2)  Assistive Device Front wheel walker  Distance Ambulated (ft) 4 ft  Range of Motion/Exercises Active;All extremities  Activity Response Tolerated well  Mobility visit 1 Mobility  Mobility Specialist Start Time (ACUTE ONLY) 1138  Mobility Specialist Stop Time (ACUTE ONLY) 1152  Mobility Specialist Time Calculation (min) (ACUTE ONLY) 14 min   Pt received in bed, pleasant and agreeable to mobility. Pt does not require assistance to get EOB, required ModA +2 to stand and transfer with RW. Tolerated well, required assist for balance and safety. Left pt in chair with alarm on and lunch tray set up. All needs met.  Sherrilee Ditty Mobility Specialist Please contact via Special Educational Needs Teacher or  Rehab office at [phone]

## 2024-08-12 NOTE — TOC Initial Note (Signed)
 Transition of Care [facility]) - Initial/Assessment Note    Patient Details  Name: [name] MRN: [id number] Date of Birth: [birth year]  Transition [facility]) CM/SW Contact:    Corean ONEIDA Haddock, RN Phone Number: 08/12/2024, 2:10 PM  Clinical Narrative:                    Noted patient admitted from [facility] PT eval pending - VM left for [facility] requesting return call to review patient's baseline - Spoke with [employer].  He states that patient has been at [facility] approximately 5 months.  He states he is currently in rehab himself, so he has not seen the patient in quite some time to know what his baseline is     Patient Goals and CMS Choice            Expected Discharge Plan and Services                                              Prior Living Arrangements/Services                       Activities of Daily Living   ADL Screening (condition at time of admission) Independently performs ADLs?: Yes (appropriate for developmental age) Is the patient deaf or have difficulty hearing?: No Does the patient have difficulty seeing, even when wearing glasses/contacts?: No Does the patient have difficulty concentrating, remembering, or making decisions?: No  Permission Sought/Granted                  Emotional Assessment              Admission diagnosis:  Hyperkalemia [E87.5] Hyperglycemia [R73.9] AKI (acute kidney injury) [N17.9] Diabetic ketoacidosis without coma associated with type 2 diabetes mellitus (HCC) [E11.10] Pneumonia of right lower lobe due to infectious organism [J18.9] Patient Active Problem List   Diagnosis Date Noted   Diabetic ketoacidosis without coma associated with type 2 diabetes mellitus (HCC) 08/10/2024   Gross hematuria 08/10/2024   AKI (acute kidney injury) 08/10/2024   Hyperkalemia 08/10/2024   Pneumonia on CT 08/10/2024   Seizures (HCC)    Thrombocytopenia 12/07/2023   Chronic  diastolic CHF (congestive heart failure) (HCC) 12/06/2023   Depression 12/06/2023   Abnormal LFTs 12/06/2023   BPH (benign prostatic hyperplasia) 12/06/2023   Diabetes mellitus treated with oral medication (HCC) 10/25/2023   Pain due to onychomycosis of toenails of both feet 11/15/2021   Poor mobility 09/06/2020   Type 2 diabetes mellitus with proteinuria (HCC) 12/31/2019   Full incontinence of feces 12/10/2019   Urge incontinence of urine 12/10/2019   Chronic communicating hydrocephalus (HCC) 04/22/2019   Vitamin B12 deficiency 12/02/2018   Chronic pain of right ankle 12/18/2015   Obesity 10/16/2015   Hyperlipidemia associated with type 2 diabetes mellitus (HCC) 09/13/2015   History of stroke 12/17/2013   Hypertension associated with diabetes (HCC) 12/17/2013   Cardiomyopathy (HCC) 12/17/2013   Type 2 diabetes mellitus with diabetic polyneuropathy (HCC) 12/17/2013   PCP:  Valerio Melanie ONEIDA, NP Pharmacy:   [facility], [place] - [address] [place] [place] [address] Phone: [phone] Fax: [phone]  [facility] - [place], [place] - [address] Portal to Registered [facility] [address] [facility]  [place] [address] Phone: [phone] Fax: [phone]     Social Drivers of Health (SDOH) Social History: SDOH Screenings   Food Insecurity: No Food Insecurity (08/11/2024)  Housing: Low Risk (08/11/2024)  Transportation Needs: No Transportation Needs (08/11/2024)  Utilities: Not At Risk (08/11/2024)  Alcohol Screen: Low Risk (01/22/2024)  Depression (PHQ2-9): Low Risk (01/22/2024)  Financial Resource Strain: Low Risk (01/22/2024)  Physical Activity: Insufficiently Active (01/22/2024)  Social Connections: Socially Isolated (01/22/2024)  Stress: No Stress Concern Present (01/22/2024)  Tobacco Use: Medium Risk (02/03/2024)  Health Literacy: Inadequate Health Literacy (01/22/2024)   SDOH Interventions:     Readmission  Risk Interventions     No data to display

## 2024-08-12 NOTE — Progress Notes (Signed)
" °  PROGRESS NOTE    [name]  FMW:[id number] DOB: 01-25-1971 DOA: 08/09/2024 PCP: Valerio Melanie DASEN, NP  209A/209A-AA  LOS: 2 days   Brief hospital course:   Assessment & Plan: [name] is a 54 y.o. male with medical history significant for CVA, hydrocephalus, seizures, dementia, HTN, DM, diastolic CHF, BPH/urge incontinence sent from his facility at [facility] healthcare for evaluation of gross hematuria, found to be in DKA/HHS on workup.    * Diabetic ketoacidosis without coma associated with type 2 diabetes mellitus (HCC) --cont glargine 35u daily --ACHS and SSI  Hyperkalemia Potassium 5.9 S/p calcium  gluconate and nebulized albuterol  in the ED --resolved after insulin  gtt  AKI (acute kidney injury) Creatinine 1.74, up from baseline of 1.15 about 8 months prior --improved with MIVF --oral hydration now  Gross hematuria BPH / chronic urinary incontinence Last urology visit 2023 (Foley versus PTNS discussed) --UA on presentation with small Hgb, Hgb 14.5 --cont Flomax  and oxybutynin   Pneumonia, ruled out --procal 0.2.  Initial CXR no mention of acute finding.  No sign of respiratory symptoms. --d/c'ed abx  Lactic acidosis --likely due to DKA.  Resolved after IVF.  Chronic communicating hydrocephalus (HCC) No acute issues  Dementia Continue Aricept  Delirium precautions  History of stroke Continue atorvastatin  --cont ASA  Seizures (HCC) Continue Neurontin   Chronic diastolic CHF (congestive heart failure) (HCC) Clinically euvolemic   DVT prophylaxis: Lovenox  SQ Code Status: Full code  Family Communication:  Level of care: Med-Surg Dispo:   The patient is from: [facility]  Anticipated d/c is to: [facility]  Anticipated d/c date is: 1-2 days   Subjective and Interval History:  Pt said he didn't eat well because he didn't like the food.   Objective: Vitals:   08/12/24 0436 08/12/24 0528 08/12/24 0800 08/12/24 1600  BP: (!)  134/94  (!) 143/91 (!) 138/92  Pulse: (!) 55  (!) 59 (!) 57  Resp: 16  18 18   Temp: (!) 97.4 F (36.3 C) 97.7 F (36.5 C) 98.1 F (36.7 C) (!) 97.4 F (36.3 C)  TempSrc: Oral Oral Oral Oral  SpO2: 92%  94% 98%  Weight:      Height:        Intake/Output Summary (Last 24 hours) at 08/12/2024 1940 Last data filed at 08/12/2024 1918 Gross per 24 hour  Intake 240 ml  Output 850 ml  Net -610 ml   Filed Weights   08/09/24 2300 08/10/24 0823  Weight: 122.9 kg 115.8 kg    Examination:   Constitutional: NAD, alert, oriented to person and place HEENT: conjunctivae and lids normal, EOMI CV: No cyanosis.   RESP: normal respiratory effort, on RA Extremities: some edema in BLE SKIN: warm, dry Neuro: II - XII grossly intact.     Data Reviewed: I have personally reviewed labs and imaging studies   Ellouise Haber, MD [facility] If 7PM-7AM, please contact night-coverage 08/12/2024, 7:40 PM   "

## 2024-08-12 NOTE — Inpatient Diabetes Management (Signed)
 Inpatient Diabetes Program Recommendations  AACE/ADA: New Consensus Statement on Inpatient Glycemic Control (2015)  Target Ranges:  Prepandial:   less than 140 mg/dL      Peak postprandial:   less than 180 mg/dL (1-2 hours)      Critically ill patients:  140 - 180 mg/dL    Latest Reference Range & Units 08/11/24 07:50 08/11/24 11:59 08/11/24 16:46 08/11/24 19:51  Glucose-Capillary 70 - 99 mg/dL 661 (H)  11 units Novolog   25 units Semglee   265 (H)  8 units Novolog   205 (H)  5 units Novolog   237 (H)  2 units Novolog   10 units Semglee    (H): Data is abnormally high  Latest Reference Range & Units 08/12/24 08:24 08/12/24 11:37  Glucose-Capillary 70 - 99 mg/dL 864 (H) 807 (H)  (H): Data is abnormally high   From [facility]   SNF DM Meds: Metformin  XR 500 mg BID  Current Orders: Semglee  35 units daily     Novolog  Moderate Correction Scale/ SSI (0-15 units) TID AC + HS    Staff at [facility] will administer the insulin  to patient as long as we send an order when he is discharged from the hospital.   Note Semglee  increased to 35 units daily  CBGs so far today are much better controlled after increase of Semglee   Will follow    --Will follow patient during hospitalization--  Adina Rudolpho Arrow RN, MSN, CDCES Diabetes Coordinator Inpatient Glycemic Control Team Team Pager: [phone] (8a-5p)

## 2024-08-12 NOTE — Evaluation (Signed)
 Physical Therapy Evaluation Patient Details Name: [name] MRN: [id number] DOB: 1971-05-30 Today's Date: 08/12/2024  History of Present Illness  [name] is a 54 y.o. male with medical history significant for CVA, hydrocephalus, seizures, dementia, HTN, DM, diastolic CHF, BPH/urge incontinence sent from his facility at [facility] healthcare for evaluation of gross hematuria, found to be in DKA/HHS   Clinical Impression  Patient received in recliner, sleeping, but easily rouses to voice. He is pleasant and agrees to PT assessment. Patient is oriented to place, month, year and president.  Unsure of accuracy of prior mobility information as he states he does not use a walker at baseline. Patient is able to stand with min A from recliner and ambulated 5 feet with RW and min A, chair follow for safety. BLEs are wobbly in standing and poor foot placement/coordination of LEs noted. He will continue to benefit from skilled PT to improve safety and independence.         If plan is discharge home, recommend the following: A little help with walking and/or transfers;A little help with bathing/dressing/bathroom;Help with stairs or ramp for entrance;Assist for transportation   Can travel by private vehicle    With A    Equipment Recommendations Rolling walker (2 wheels)  Recommendations for Other Services       Functional Status Assessment Patient has had a recent decline in their functional status and demonstrates the ability to make significant improvements in function in a reasonable and predictable amount of time.     Precautions / Restrictions Precautions Precautions: Fall Recall of Precautions/Restrictions: Impaired Restrictions Weight Bearing Restrictions Per Provider Order: No      Mobility  Bed Mobility               General bed mobility comments: NT patient received in recliner and remained in recliner    Transfers Overall transfer level: Needs  assistance Equipment used: Rolling walker (2 wheels) Transfers: Sit to/from Stand Sit to Stand: Min assist           General transfer comment: cues for hand placement, assist to boost up    Ambulation/Gait Ambulation/Gait assistance: Contact guard assist Gait Distance (Feet): 5 Feet Assistive device: Rolling walker (2 wheels) Gait Pattern/deviations: Step-to pattern, Decreased step length - right, Decreased step length - left, Decreased stride length Gait velocity: decr     General Gait Details: patient ambulated 5 feet forward with chair follow using RW. His legs are wobbly with gait.  Stairs            Wheelchair Mobility     Tilt Bed    Modified Rankin (Stroke Patients Only)       Balance Overall balance assessment: Needs assistance Sitting-balance support: Feet supported Sitting balance-Leahy Scale: Good     Standing balance support: Bilateral upper extremity supported, During functional activity, Reliant on assistive device for balance Standing balance-Leahy Scale: Fair                               Pertinent Vitals/Pain Pain Assessment Pain Assessment: No/denies pain    Home Living Family/patient expects to be discharged to:: Assisted living                   Additional Comments: Patient states he does not use walker at basline, nor have one    Prior Function Prior Level of Function : Needs assist;Patient poor historian/Family not available  Mobility Comments: patient tells me he does not use AD, not sure if accurate ADLs Comments: Lives at [facility]     Extremity/Trunk Assessment   Upper Extremity Assessment Upper Extremity Assessment: Overall WFL for tasks assessed    Lower Extremity Assessment Lower Extremity Assessment: Generalized weakness    Cervical / Trunk Assessment Cervical / Trunk Assessment: Normal  Communication   Communication Communication: No apparent difficulties     Cognition Arousal: Alert Behavior During Therapy: WFL for tasks assessed/performed   PT - Cognitive impairments: History of cognitive impairments                         Following commands: Intact       Cueing Cueing Techniques: Verbal cues     General Comments      Exercises     Assessment/Plan    PT Assessment Patient needs continued PT services  PT Problem List Decreased strength;Decreased activity tolerance;Decreased mobility;Decreased safety awareness;Decreased knowledge of use of DME;Decreased coordination;Decreased cognition       PT Treatment Interventions DME instruction;Gait training;Functional mobility training;Therapeutic activities;Therapeutic exercise;Balance training;Cognitive remediation;Patient/family education    PT Goals (Current goals can be found in the Care Plan section)  Acute Rehab PT Goals Patient Stated Goal: go to a ALF in [place] that he was at before PT Goal Formulation: With patient Time For Goal Achievement: 08/26/24 Potential to Achieve Goals: Fair    Frequency Min 2X/week     Co-evaluation               AM-PAC PT 6 Clicks Mobility  Outcome Measure Help needed turning from your back to your side while in a flat bed without using bedrails?: A Little Help needed moving from lying on your back to sitting on the side of a flat bed without using bedrails?: A Little Help needed moving to and from a bed to a chair (including a wheelchair)?: A Little Help needed standing up from a chair using your arms (e.g., wheelchair or bedside chair)?: A Little Help needed to walk in hospital room?: A Little Help needed climbing 3-5 steps with a railing? : A Lot 6 Click Score: 17    End of Session   Activity Tolerance: Patient tolerated treatment well Patient left: in chair;with call bell/phone within reach;with chair alarm set Nurse Communication: Mobility status PT Visit Diagnosis: Unsteadiness on feet (R26.81);Other  abnormalities of gait and mobility (R26.89);Muscle weakness (generalized) (M62.81);Difficulty in walking, not elsewhere classified (R26.2)    Time: 8551-8498 PT Time Calculation (min) (ACUTE ONLY): 13 min   Charges:   PT Evaluation $PT Eval Low Complexity: 1 Low   PT General Charges $$ ACUTE PT VISIT: 1 Visit         Sully Dyment, PT, GCS 08/12/24,3:29 PM

## 2024-08-13 LAB — GLUCOSE, CAPILLARY
Glucose-Capillary: 219 mg/dL — ABNORMAL HIGH (ref 70–99)
Glucose-Capillary: 253 mg/dL — ABNORMAL HIGH (ref 70–99)
Glucose-Capillary: 268 mg/dL — ABNORMAL HIGH (ref 70–99)

## 2024-08-13 LAB — CULTURE, BLOOD (ROUTINE X 2)
Culture: NO GROWTH
Culture: NO GROWTH
Special Requests: ADEQUATE
Special Requests: ADEQUATE

## 2024-08-13 MED ORDER — INSULIN GLARGINE-YFGN 100 UNIT/ML ~~LOC~~ SOLN: 40.0000 [IU] | Freq: Every day | SUBCUTANEOUS | 2 refills | Status: AC

## 2024-08-13 MED ORDER — LISINOPRIL 10 MG PO TABS
10.0000 mg | ORAL_TABLET | Freq: Every day | ORAL | Status: DC
  Administered 2024-08-13: 10 mg via ORAL
  Filled 2024-08-13: qty 1

## 2024-08-13 MED ORDER — INSULIN GLARGINE-YFGN 100 UNIT/ML ~~LOC~~ SOLN
40.0000 [IU] | Freq: Every day | SUBCUTANEOUS | Status: DC
  Administered 2024-08-13: 40 [IU] via SUBCUTANEOUS
  Filled 2024-08-13: qty 0.4

## 2024-08-13 NOTE — TOC Progression Note (Signed)
 Transition of Care [facility]) - Progression Note    Patient Details  Name: [name] MRN: [id number] Date of Birth: 1971/02/16  [facility]) CM/SW Contact  Corean ONEIDA Haddock, RN Phone Number: 08/13/2024, 10:56 AM  Clinical Narrative:    Beatris with Cherylle at [place] house.  She request that I call back at noon.  She is attempting to determine if patient will require assessment prior to return                     Expected Discharge Plan and Services                                               Social Drivers of Health (SDOH) Interventions SDOH Screenings   Food Insecurity: No Food Insecurity (08/11/2024)  Housing: Low Risk (08/11/2024)  Transportation Needs: No Transportation Needs (08/11/2024)  Utilities: Not At Risk (08/11/2024)  Alcohol Screen: Low Risk (01/22/2024)  Depression (PHQ2-9): Low Risk (01/22/2024)  Financial Resource Strain: Low Risk (01/22/2024)  Physical Activity: Insufficiently Active (01/22/2024)  Social Connections: Socially Isolated (01/22/2024)  Stress: No Stress Concern Present (01/22/2024)  Tobacco Use: Medium Risk (02/03/2024)  Health Literacy: Inadequate Health Literacy (01/22/2024)    Readmission Risk Interventions     No data to display

## 2024-08-13 NOTE — Care Management Important Message (Signed)
 Important Message  Patient Details  Name: [name] MRN: [id number] Date of Birth: 16-Dec-1970   Important Message Given:  Yes - Medicare IM     Crestina Strike 08/13/2024, 12:53 PM

## 2024-08-13 NOTE — Discharge Summary (Addendum)
 "  Physician Discharge Summary   [name]  male DOB: 1971-06-09  FMW:[id number]  PCP: Valerio Melanie DASEN, NP  Admit date: 08/09/2024 Discharge date: 08/13/2024  Admitted From: group home Disposition:  group home Home Health: Yes CODE STATUS: Full code  Discharge Instructions     Diet Carb Modified   Complete by: As directed       Hospital Course:  For full details, please see H&P, progress notes, consult notes and ancillary notes.  Briefly,  [name] is a 54 y.o. male with medical history significant for CVA, hydrocephalus, seizures, dementia, HTN, DM, diastolic CHF, BPH/urge incontinence sent from his facility at [facility] healthcare for evaluation of gross hematuria, found to be in DKA/HHS on workup.    * Diabetic ketoacidosis without coma associated with type 2 diabetes mellitus (HCC) --received insulin  gtt. --A1c 16.8.  Pt only has metformin  on home med list PTA. --to simplify regimen, pt was discharged on glargine 40u daily.  Further insulin  adjustment to be done as outpatient by PCP. --resume home metformin  after discharge.   Hyperkalemia Potassium 5.9 S/p calcium  gluconate and nebulized albuterol  in the ED --resolved after insulin  gtt   AKI (acute kidney injury) Creatinine 1.74, up from baseline of 1.15 about 8 months prior --improved with MIVF, Cr 1.19 prior to discharge.   Gross hematuria BPH / chronic urinary incontinence Last urology visit 2023 (Foley versus PTNS discussed) --UA on presentation with small Hgb, Hgb 14.5.  did not note gross hematuria. --cont Flomax  and oxybutynin  --resume home ASA and plavix .  If hematuria returns, then will need outpatient urology followup.   Pneumonia, ruled out --procal 0.2.  Initial CXR no mention of acute finding.  No sign of respiratory symptoms. --d/c'ed abx   Lactic acidosis --likely due to DKA.  Resolved after IVF.   Chronic communicating hydrocephalus (HCC) No acute issues   Dementia Continue  Aricept    History of stroke Continue atorvastatin  --cont home ASA and plavix    Seizures (HCC) Continue Neurontin    Chronic diastolic CHF (congestive heart failure) (HCC) Clinically euvolemic --discontinue home coreg  due to HR in 50's without coreg .  HTN --cont home amlodipine  and Lisinopril    Unless noted above, medications under STOP list are ones pt was not taking PTA.  Discharge Diagnoses:  Principal Problem:   Diabetic ketoacidosis without coma associated with type 2 diabetes mellitus (HCC) Active Problems:   Urge incontinence of urine   BPH (benign prostatic hyperplasia)   Gross hematuria   AKI (acute kidney injury)   Hyperkalemia   History of stroke   Chronic communicating hydrocephalus (HCC)   Pneumonia on CT   Seizures (HCC)   Chronic diastolic CHF (congestive heart failure) (HCC)   30 Day Unplanned Readmission Risk Score    Flowsheet Row ED to Hosp-Admission (Current) from 08/09/2024 in [facility]  30 Day Unplanned Readmission Risk Score (%) 13.11 Filed at 08/13/2024 1200    This score is the patient's risk of an unplanned readmission within 30 days of being discharged (0 -100%). The score is based on dignosis, age, lab data, medications, orders, and past utilization.   Low:  0-14.9   Medium: 15-21.9   High: 22-29.9   Extreme: 30 and above         Discharge Instructions:  Allergies as of 08/13/2024   No Known Allergies      Medication List     STOP taking these medications    acetaminophen  325 MG tablet Commonly  known as: Tylenol    carvedilol  6.25 MG tablet Commonly known as: COREG    cyanocobalamin  1000 MCG tablet Commonly known as: VITAMIN B12       TAKE these medications    Accu-Chek Guide Test test strip Generic drug: glucose blood Use as instructed   Accu-Chek Softclix Lancets lancets USE 2-3 TIMES A DAY AS DIRECTED   amLODipine  5 MG tablet Commonly known as: NORVASC  Take 1 tablet (5 mg  total) by mouth daily.   aspirin  EC 81 MG tablet Commonly known as: Aspir-Low Take 1 tablet (81 mg total) by mouth daily. Swallow whole.   atorvastatin  80 MG tablet Commonly known as: LIPITOR  TAKE 1 TABLET BY MOUTH ONCE DAILY AT 6:00PM   Blood Pressure Monitor Automat Devi Use to monitor blood pressure once daily.   citalopram  10 MG tablet Commonly known as: CELEXA  Take 1 tablet (10 mg total) by mouth daily.   clopidogrel  75 MG tablet Commonly known as: PLAVIX  TAKE 1 TABLET BY MOUTH ONCE EVERY MORNING WITH BREAKFAST   Contour Next Monitor w/Device Kit Use to check blood sugar 3 times daily, fasting in morning with goal <130 and 2 hours after meals with goal <180.  Bring blood sugar log to visits.  Dx. E11.42   donepezil  5 MG tablet Commonly known as: ARICEPT  Take 1 tablet (5 mg total) by mouth daily.   gabapentin  300 MG capsule Commonly known as: NEURONTIN  Take 1 capsule (300 mg total) by mouth 3 (three) times daily.   insulin  glargine-yfgn 100 UNIT/ML injection Inject 0.4 mLs (40 Units total) into the skin daily. Start taking on: August 14, 2024   lisinopril  5 MG tablet Commonly known as: ZESTRIL  Take 1 tablet (5 mg total) by mouth daily.   metFORMIN  500 MG 24 hr tablet Commonly known as: GLUCOPHAGE -XR Take 1 tablet (500 mg total) by mouth 2 (two) times daily with a meal.   MiraLax  17 GM/SCOOP powder Generic drug: polyethylene glycol powder Take 17 g by mouth daily. Mix 17 gm in 8 ounces of liquid and give orally once a day for constipation.   oxybutynin  10 MG 24 hr tablet Commonly known as: Ditropan  XL Take 1 tablet (10 mg total) by mouth at bedtime.   pantoprazole  40 MG tablet Commonly known as: PROTONIX  Take 1 tablet (40 mg total) by mouth daily.   senna 8.6 MG Tabs tablet Commonly known as: SENOKOT Take 1 tablet by mouth 2 (two) times a week. Take 1 tablet twice a week by mouth on Monday and Thursday.   tamsulosin  0.4 MG Caps capsule Commonly known  as: FLOMAX  Take 1 capsule (0.4 mg total) by mouth daily after supper.         Follow-up Information     Valerio Melanie DASEN, NP. Go in 1 week(s).   Specialty: Nurse Practitioner Why: go to appt. on 08/18/24 @9 :20am  please arrive at 9:05am, with ID and insurance card Contact information: [address] [place] [address] [phone]                 Allergies[1]   The results of significant diagnostics from this hospitalization (including imaging, microbiology, ancillary and laboratory) are listed below for reference.   Consultations:   Procedures/Studies: CT ABDOMEN PELVIS W CONTRAST Result Date: 08/10/2024 EXAM: CT ABDOMEN AND PELVIS WITH CONTRAST 08/10/2024 02:14:13 AM TECHNIQUE: CT of the abdomen and pelvis was performed with the administration of 75 mL of iohexol  (OMNIPAQUE ) 300 MG/ML solution. Multiplanar reformatted images are provided for review. Automated  exposure control, iterative reconstruction, and/or weight-based adjustment of the mA/kV was utilized to reduce the radiation dose to as low as reasonably achievable. COMPARISON: None available. CLINICAL HISTORY: Hematuria. Chest pain/flank pain. FINDINGS: LOWER CHEST: Interlobular septal coarsening and ground glass opacities in the posterior right lung suspicious for pneumonia. LIVER: The liver is unremarkable. GALLBLADDER AND BILE DUCTS: Cholelithiasis. No evidence of acute cholecystitis. No biliary ductal dilatation. SPLEEN: No acute abnormality. PANCREAS: No acute abnormality. ADRENAL GLANDS: No acute abnormality. KIDNEYS, URETERS AND BLADDER: Absent left kidney. Nonobstructing stones in the right kidney. No hydronephrosis. No perinephric or periureteral stranding. Urinary bladder is unremarkable. GI AND BOWEL: Stomach demonstrates no acute abnormality. Normal appendix. Moderate stool burden in the colon. There is no bowel obstruction. PERITONEUM AND RETROPERITONEUM: No ascites. No free air. VASCULATURE: Aorta is normal  in caliber. Aortic atherosclerotic calcification. LYMPH NODES: No lymphadenopathy. REPRODUCTIVE ORGANS: No acute abnormality. BONES AND SOFT TISSUES: No acute osseous abnormality. No focal soft tissue abnormality. IMPRESSION: 1. Nonobstructing stones in the right kidney; absent left kidney. 2. Interlobular septal coarsening and ground-glass opacities in the posterior right lower lobe suspicious for pneumonia. Electronically signed by: Norman Gatlin MD 08/10/2024 02:23 AM EST RP Workstation: [id number]   DG Chest Portable 1 View Result Date: 08/10/2024 EXAM: 1 VIEW(S) XRAY OF THE CHEST 08/10/2024 12:25:00 AM COMPARISON: 12/06/2023 CLINICAL HISTORY: Chest pain. FINDINGS: LINES, TUBES AND DEVICES: Left chest loop recorder in place. LUNGS AND PLEURA: No focal pulmonary opacity. No pleural effusion. No pneumothorax. HEART AND MEDIASTINUM: Left chest loop recorder in place. No acute abnormality of the cardiac and mediastinal silhouettes. BONES AND SOFT TISSUES: No acute osseous abnormality. IMPRESSION: 1. No acute cardiopulmonary process. Electronically signed by: Norman Gatlin MD 08/10/2024 12:43 AM EST RP Workstation: [id number]      Labs: BNP (last 3 results) Recent Labs    12/06/23 2358  BNP 40.9   Basic Metabolic Panel: Recent Labs  Lab 08/10/24 0001 08/10/24 0502 08/10/24 0736 08/10/24 1205 08/11/24 0832  NA 134* 138 141 149* 141  K 5.9* 3.5 3.6 3.9 4.8  CL 93* 109 106 110 102  CO2 26 10* 21* 29 30  GLUCOSE 791* 167* 236* 129* 373*  BUN 36* 12 23* 28* 15  CREATININE 1.74* 0.32* 0.89 1.19 1.19  CALCIUM  9.8 5.5* 7.4* 8.9 9.0  MG  --   --   --   --  2.0   Liver Function Tests: Recent Labs  Lab 08/10/24 0001  AST 12*  ALT 23  ALKPHOS 152*  BILITOT 0.8  PROT 8.1  ALBUMIN 4.1   Recent Labs  Lab 08/10/24 0001  LIPASE 58*   No results for input(s): AMMONIA in the last 168 hours. CBC: Recent Labs  Lab 08/10/24 0001 08/11/24 0832  WBC 6.7 4.3  NEUTROABS 5.0  --   HGB  14.5 12.4*  HCT 45.9 39.3  MCV 88.8 87.9  PLT 171 182   Cardiac Enzymes: No results for input(s): CKTOTAL, CKMB, CKMBINDEX, TROPONINI in the last 168 hours. BNP: Invalid input(s): POCBNP CBG: Recent Labs  Lab 08/12/24 1653 08/12/24 2107 08/13/24 0811 08/13/24 1151 08/13/24 1207  GLUCAP 233* 240* 219* 268* 253*   D-Dimer No results for input(s): DDIMER in the last 72 hours. Hgb A1c No results for input(s): HGBA1C in the last 72 hours. Lipid Profile No results for input(s): CHOL, HDL, LDLCALC, TRIG, CHOLHDL, LDLDIRECT in the last 72 hours. Thyroid  function studies No results for input(s): TSH, T4TOTAL, T3FREE, THYROIDAB in the last 72 hours.  Invalid input(s): FREET3 Anemia work up No results for input(s): VITAMINB12, FOLATE, FERRITIN, TIBC, IRON, RETICCTPCT in the last 72 hours. Urinalysis    Component Value Date/Time   COLORURINE STRAW (A) 08/10/2024 0500   APPEARANCEUR CLEAR (A) 08/10/2024 0500   APPEARANCEUR Hazy (A) 03/05/2021 1156   LABSPEC 1.035 (H) 08/10/2024 0500   LABSPEC 1.010 12/14/2013 0959   PHURINE 5.0 08/10/2024 0500   GLUCOSEU >=500 (A) 08/10/2024 0500   GLUCOSEU Negative 12/14/2013 0959   HGBUR SMALL (A) 08/10/2024 0500   BILIRUBINUR NEGATIVE 08/10/2024 0500   BILIRUBINUR Negative 03/05/2021 1156   BILIRUBINUR Negative 12/14/2013 0959   KETONESUR 5 (A) 08/10/2024 0500   PROTEINUR NEGATIVE 08/10/2024 0500   UROBILINOGEN 1.0 12/22/2013 1648   NITRITE NEGATIVE 08/10/2024 0500   LEUKOCYTESUR NEGATIVE 08/10/2024 0500   LEUKOCYTESUR Negative 12/14/2013 0959   Sepsis Labs Recent Labs  Lab 08/10/24 0001 08/11/24 0832  WBC 6.7 4.3   Microbiology Recent Results (from the past 240 hours)  Blood culture (routine x 2)     Status: None (Preliminary result)   Collection Time: 08/10/24  3:34 AM   Specimen: BLOOD LEFT ARM  Result Value Ref Range Status   Specimen Description BLOOD LEFT ARM  Final    Special Requests   Final    BOTTLES DRAWN AEROBIC AND ANAEROBIC Blood Culture adequate volume   Culture   Final    NO GROWTH 3 DAYS Performed at [facility], [address] [place], [place] [address]    Report Status PENDING  Incomplete  Blood culture (routine x 2)     Status: None (Preliminary result)   Collection Time: 08/10/24  3:34 AM   Specimen: BLOOD RIGHT ARM  Result Value Ref Range Status   Specimen Description BLOOD RIGHT ARM  Final   Special Requests   Final    BOTTLES DRAWN AEROBIC AND ANAEROBIC Blood Culture adequate volume   Culture   Final    NO GROWTH 3 DAYS Performed at [facility], [address], [place], [place] [address]    Report Status PENDING  Incomplete  Resp panel by RT-PCR (RSV, Flu A&B, Covid) Anterior Nasal Swab     Status: None   Collection Time: 08/10/24  3:34 AM   Specimen: Anterior Nasal Swab  Result Value Ref Range Status   SARS Coronavirus 2 by RT PCR NEGATIVE NEGATIVE Final    Comment: (NOTE) SARS-CoV-2 target nucleic acids are NOT DETECTED.  The SARS-CoV-2 RNA is generally detectable in upper respiratory specimens during the acute phase of infection. The lowest concentration of SARS-CoV-2 viral copies this assay can detect is 138 copies/mL. A negative result does not preclude SARS-Cov-2 infection and should not be used as the sole basis for treatment or other patient management decisions. A negative result may occur with  improper specimen collection/handling, submission of specimen other than nasopharyngeal swab, presence of viral mutation(s) within the areas targeted by this assay, and inadequate number of viral copies(<138 copies/mL). A negative result must be combined with clinical observations, patient history, and epidemiological information. The expected result is Negative.  Fact Sheet for Patients:  bloggercourse.com  Fact Sheet for Healthcare Providers:   seriousbroker.it  This test is no t yet approved or cleared by the [place]  FDA and  has been authorized for detection and/or diagnosis of SARS-CoV-2 by FDA under an Emergency Use Authorization (EUA). This EUA will remain  in effect (meaning this test can be used) for the duration of the COVID-19 declaration  under Section 564(b)(1) of the Act, 21 U.S.C.section 360bbb-3(b)(1), unless the authorization is terminated  or revoked sooner.       Influenza A by PCR NEGATIVE NEGATIVE Final   Influenza B by PCR NEGATIVE NEGATIVE Final    Comment: (NOTE) The Xpert Xpress SARS-CoV-2/FLU/RSV plus assay is intended as an aid in the diagnosis of influenza from Nasopharyngeal swab specimens and should not be used as a sole basis for treatment. Nasal washings and aspirates are unacceptable for Xpert Xpress SARS-CoV-2/FLU/RSV testing.  Fact Sheet for Patients: bloggercourse.com  Fact Sheet for Healthcare Providers: seriousbroker.it  This test is not yet approved or cleared by the [place]  FDA and has been authorized for detection and/or diagnosis of SARS-CoV-2 by FDA under an Emergency Use Authorization (EUA). This EUA will remain in effect (meaning this test can be used) for the duration of the COVID-19 declaration under Section 564(b)(1) of the Act, 21 U.S.C. section 360bbb-3(b)(1), unless the authorization is terminated or revoked.     Resp Syncytial Virus by PCR NEGATIVE NEGATIVE Final    Comment: (NOTE) Fact Sheet for Patients: bloggercourse.com  Fact Sheet for Healthcare Providers: seriousbroker.it  This test is not yet approved or cleared by the [place]  FDA and has been authorized for detection and/or diagnosis of SARS-CoV-2 by FDA under an Emergency Use Authorization (EUA). This EUA will remain in effect (meaning this test can be used) for  the duration of the COVID-19 declaration under Section 564(b)(1) of the Act, 21 U.S.C. section 360bbb-3(b)(1), unless the authorization is terminated or revoked.  Performed at [facility], [address] [place], [place] [address]   MRSA Next Gen by PCR, Nasal     Status: None   Collection Time: 08/10/24  8:27 AM   Specimen: Nasal Mucosa; Nasal Swab  Result Value Ref Range Status   MRSA by PCR Next Gen NOT DETECTED NOT DETECTED Final    Comment: (NOTE) The GeneXpert MRSA Assay (FDA approved for NASAL specimens only), is one component of a comprehensive MRSA colonization surveillance program. It is not intended to diagnose MRSA infection nor to guide or monitor treatment for MRSA infections. Test performance is not FDA approved in patients less than 56 years old. Performed at [facility], [address] [place], [place] [address]      Total time spend on discharging this patient, including the last patient exam, discussing the hospital stay, instructions for ongoing care as it relates to all pertinent caregivers, as well as preparing the medical discharge records, prescriptions, and/or referrals as applicable, is 35 minutes.    Ellouise Haber, MD  [facility] 08/13/2024, 2:50 PM       [1] No Known Allergies  "

## 2024-08-13 NOTE — Progress Notes (Signed)
 Report given to Katina at [facility]

## 2024-08-13 NOTE — Plan of Care (Signed)

## 2024-08-13 NOTE — Inpatient Diabetes Management (Signed)
 Inpatient Diabetes Program Recommendations  AACE/ADA: New Consensus Statement on Inpatient Glycemic Control   Target Ranges:  Prepandial:   less than 140 mg/dL      Peak postprandial:   less than 180 mg/dL (1-2 hours)      Critically ill patients:  140 - 180 mg/dL    Latest Reference Range & Units 08/12/24 08:24 08/12/24 11:37 08/12/24 16:53 08/12/24 21:07  Glucose-Capillary 70 - 99 mg/dL 864 (H) 807 (H) 766 (H) 240 (H)   Review of Glycemic Control  Diabetes history: DM2 Outpatient Diabetes medications: Metformin  XR 500 mg BID Current orders for Inpatient glycemic control: Semglee  35 units daily, Novolog  0-15 units TID with meals, Novolog  0-5 units QHS   Inpatient Diabetes Program Recommendations:     Insulin : Please consider ordering Novolog  3 units TID with meals for meal coverage if patient eats at least 50% of meals.       HbgA1C: A1C 16.8% on 08/10/24 indicating an average glucose of 435 mg/dl over the past 2-3 months. Prior A1C 6.3% on 01/20/24.   Outpatient DM:  Staff at [facility] will administer the insulin  to patient as long as we send an order when he is discharged from the hospital.   Thanks, Earnie Gainer, RN, MSN, CDCES Diabetes Coordinator Inpatient Diabetes Program [phone] (Team Pager from 8am to 5pm)

## 2024-08-13 NOTE — NC FL2 (Signed)
 " [place]  MEDICAID FL2 LEVEL OF CARE FORM     IDENTIFICATION  Patient Name: [name] Birthdate: 1971-01-18 Sex: male Admission Date (Current Location): 08/09/2024  [place] and [place] Number:  Chiropodist and Address:  [facility], [address] [place], [place] [address]      Provider Number: [id number]  Attending Physician Name and Address:  Awanda City, MD  Relative Name and Phone Number:       Current Level of Care: Hospital Recommended Level of Care: Assisted Living Facility Prior Approval Number:    Date Approved/Denied:   PASRR Number:    Discharge Plan: Other (Comment) (ALF)    Current Diagnoses: Patient Active Problem List   Diagnosis Date Noted   Diabetic ketoacidosis without coma associated with type 2 diabetes mellitus (HCC) 08/10/2024   Gross hematuria 08/10/2024   AKI (acute kidney injury) 08/10/2024   Hyperkalemia 08/10/2024   Pneumonia on CT 08/10/2024   Seizures (HCC)    Thrombocytopenia 12/07/2023   Chronic diastolic CHF (congestive heart failure) (HCC) 12/06/2023   Depression 12/06/2023   Abnormal LFTs 12/06/2023   BPH (benign prostatic hyperplasia) 12/06/2023   Diabetes mellitus treated with oral medication (HCC) 10/25/2023   Pain due to onychomycosis of toenails of both feet 11/15/2021   Poor mobility 09/06/2020   Type 2 diabetes mellitus with proteinuria (HCC) 12/31/2019   Full incontinence of feces 12/10/2019   Urge incontinence of urine 12/10/2019   Chronic communicating hydrocephalus (HCC) 04/22/2019   Vitamin B12 deficiency 12/02/2018   Chronic pain of right ankle 12/18/2015   Obesity 10/16/2015   Hyperlipidemia associated with type 2 diabetes mellitus (HCC) 09/13/2015   History of stroke 12/17/2013   Hypertension associated with diabetes (HCC) 12/17/2013   Cardiomyopathy (HCC) 12/17/2013   Type 2 diabetes mellitus with diabetic polyneuropathy (HCC) 12/17/2013    Orientation RESPIRATION  BLADDER Height & Weight     Self, Place  Normal Continent Weight: 115.8 kg Height:  6' 1 (185.4 cm)  BEHAVIORAL SYMPTOMS/MOOD NEUROLOGICAL BOWEL NUTRITION STATUS      Continent Diet (Carb modified)  AMBULATORY STATUS COMMUNICATION OF NEEDS Skin   Extensive Assist Verbally Normal                       Personal Care Assistance Level of Assistance              Functional Limitations Info             SPECIAL CARE FACTORS FREQUENCY  PT (By licensed PT), OT (By licensed OT)                    Contractures Contractures Info: Not present    Additional Factors Info  Code Status, Allergies Code Status Info: Full Allergies Info: NKDA             Medication List       STOP taking these medications     acetaminophen  325 MG tablet Commonly known as: Tylenol     carvedilol  6.25 MG tablet Commonly known as: COREG     cyanocobalamin  1000 MCG tablet Commonly known as: VITAMIN B12           TAKE these medications     Accu-Chek Guide Test test strip Generic drug: glucose blood Use as instructed    Accu-Chek Softclix Lancets lancets USE 2-3 TIMES A DAY AS DIRECTED    amLODipine  5 MG tablet Commonly known as: NORVASC  Take 1 tablet (  5 mg total) by mouth daily.    aspirin  EC 81 MG tablet Commonly known as: Aspir-Low Take 1 tablet (81 mg total) by mouth daily. Swallow whole.    atorvastatin  80 MG tablet Commonly known as: LIPITOR  TAKE 1 TABLET BY MOUTH ONCE DAILY AT 6:00PM    Blood Pressure Monitor Automat Devi Use to monitor blood pressure once daily.    citalopram  10 MG tablet Commonly known as: CELEXA  Take 1 tablet (10 mg total) by mouth daily.    clopidogrel  75 MG tablet Commonly known as: PLAVIX  TAKE 1 TABLET BY MOUTH ONCE EVERY MORNING WITH BREAKFAST    Contour Next Monitor w/Device Kit Use to check blood sugar 3 times daily, fasting in morning with goal <130 and 2 hours after meals with goal <180.  Bring blood sugar log to visits.   Dx. E11.42    donepezil  5 MG tablet Commonly known as: ARICEPT  Take 1 tablet (5 mg total) by mouth daily.    gabapentin  300 MG capsule Commonly known as: NEURONTIN  Take 1 capsule (300 mg total) by mouth 3 (three) times daily.    insulin  glargine-yfgn 100 UNIT/ML injection Inject 0.4 mLs (40 Units total) into the skin daily. Start taking on: August 14, 2024    lisinopril  5 MG tablet Commonly known as: ZESTRIL  Take 1 tablet (5 mg total) by mouth daily.    metFORMIN  500 MG 24 hr tablet Commonly known as: GLUCOPHAGE -XR Take 1 tablet (500 mg total) by mouth 2 (two) times daily with a meal.    MiraLax  17 GM/SCOOP powder Generic drug: polyethylene glycol powder Take 17 g by mouth daily. Mix 17 gm in 8 ounces of liquid and give orally once a day for constipation.    oxybutynin  10 MG 24 hr tablet Commonly known as: Ditropan  XL Take 1 tablet (10 mg total) by mouth at bedtime.    pantoprazole  40 MG tablet Commonly known as: PROTONIX  Take 1 tablet (40 mg total) by mouth daily.    senna 8.6 MG Tabs tablet Commonly known as: SENOKOT Take 1 tablet by mouth 2 (two) times a week. Take 1 tablet twice a week by mouth on Monday and Thursday.    tamsulosin  0.4 MG Caps capsule Commonly known as: FLOMAX  Take 1 capsule (0.4 mg total) by mouth daily after supper.    Relevant Imaging Results:  Relevant Lab Results:   Additional Information SS#: [id number]  Corean ONEIDA Haddock, RN     "

## 2024-08-13 NOTE — TOC Transition Note (Signed)
 Transition of Care [facility]) - Discharge Note   Patient Details  Name: [name] MRN: [id number] Date of Birth: [birth year]  Transition of Care [facility]) CM/SW Contact:  Corean ONEIDA Haddock, RN Phone Number: 08/13/2024, 3:11 PM   Clinical Narrative:     Beatris with Catina at [facility] house and she confirms patient can return today.   Patient will DC to: [place] [facility] Anticipated DC date: 08/13/24  Family notified: Brother Christopher  Transport by: [facility]  Per MD patient ready for DC to . RN, patient, patient's family, and facility notified of DC. Discharge Summary, fl2 and therapy order sent to facility. Per Cathina they have on site therapy through [facility]   that will be seeing patient.  RN given number for report. DC packet on chart.   TOC signing off.         Patient Goals and CMS Choice            Discharge Placement                       Discharge Plan and Services Additional resources added to the After Visit Summary for                                       Social Drivers of Health (SDOH) Interventions SDOH Screenings   Food Insecurity: No Food Insecurity (08/11/2024)  Housing: Low Risk (08/11/2024)  Transportation Needs: No Transportation Needs (08/11/2024)  Utilities: Not At Risk (08/11/2024)  Alcohol Screen: Low Risk (01/22/2024)  Depression (PHQ2-9): Low Risk (01/22/2024)  Financial Resource Strain: Low Risk (01/22/2024)  Physical Activity: Insufficiently Active (01/22/2024)  Social Connections: Socially Isolated (01/22/2024)  Stress: No Stress Concern Present (01/22/2024)  Tobacco Use: Medium Risk (02/03/2024)  Health Literacy: Inadequate Health Literacy (01/22/2024)     Readmission Risk Interventions     No data to display

## 2024-08-18 ENCOUNTER — Inpatient Hospital Stay: Admitting: Nurse Practitioner

## 2025-02-03 ENCOUNTER — Ambulatory Visit
